# Patient Record
Sex: Male | Born: 1945 | Race: Black or African American | Hispanic: No | Marital: Married | Smoking: Former smoker
Health system: Southern US, Community
[De-identification: ages and names within clinical notes are randomized; demographics above are authoritative.]

## PROBLEM LIST (undated history)

## (undated) DIAGNOSIS — E041 Nontoxic single thyroid nodule: Secondary | ICD-10-CM

## (undated) DIAGNOSIS — H269 Unspecified cataract: Secondary | ICD-10-CM

## (undated) DIAGNOSIS — Z789 Other specified health status: Secondary | ICD-10-CM

## (undated) DIAGNOSIS — Z0282 Encounter for adoption services: Secondary | ICD-10-CM

## (undated) DIAGNOSIS — E785 Hyperlipidemia, unspecified: Secondary | ICD-10-CM

## (undated) DIAGNOSIS — B001 Herpesviral vesicular dermatitis: Secondary | ICD-10-CM

## (undated) DIAGNOSIS — C61 Malignant neoplasm of prostate: Secondary | ICD-10-CM

## (undated) DIAGNOSIS — N529 Male erectile dysfunction, unspecified: Secondary | ICD-10-CM

## (undated) DIAGNOSIS — N200 Calculus of kidney: Secondary | ICD-10-CM

## (undated) DIAGNOSIS — E291 Testicular hypofunction: Secondary | ICD-10-CM

## (undated) HISTORY — DX: Nontoxic single thyroid nodule: E04.1

## (undated) HISTORY — DX: Testicular hypofunction: E29.1

## (undated) HISTORY — DX: Unspecified cataract: H26.9

## (undated) HISTORY — DX: Herpesviral vesicular dermatitis: B00.1

## (undated) HISTORY — DX: Calculus of kidney: N20.0

## (undated) HISTORY — DX: Hyperlipidemia, unspecified: E78.5

## (undated) HISTORY — DX: Male erectile dysfunction, unspecified: N52.9

## (undated) HISTORY — DX: Malignant neoplasm of prostate: C61

## (undated) HISTORY — PX: RADIOACTIVE SEED IMPLANT: SHX5150

---

## 1999-08-10 ENCOUNTER — Other Ambulatory Visit: Admission: RE | Admit: 1999-08-10 | Discharge: 1999-08-10 | Payer: Self-pay | Admitting: *Deleted

## 2004-12-03 ENCOUNTER — Ambulatory Visit: Payer: Self-pay | Admitting: Endocrinology

## 2005-03-04 ENCOUNTER — Ambulatory Visit: Payer: Self-pay | Admitting: Endocrinology

## 2005-03-08 ENCOUNTER — Ambulatory Visit: Payer: Self-pay | Admitting: Endocrinology

## 2005-03-12 ENCOUNTER — Ambulatory Visit: Payer: Self-pay | Admitting: Endocrinology

## 2005-11-24 ENCOUNTER — Ambulatory Visit: Payer: Self-pay | Admitting: Endocrinology

## 2006-04-07 ENCOUNTER — Ambulatory Visit: Payer: Self-pay | Admitting: Internal Medicine

## 2006-04-12 ENCOUNTER — Ambulatory Visit: Payer: Self-pay | Admitting: Endocrinology

## 2006-09-22 ENCOUNTER — Ambulatory Visit: Payer: Self-pay | Admitting: Endocrinology

## 2007-04-11 ENCOUNTER — Ambulatory Visit: Payer: Self-pay | Admitting: Endocrinology

## 2007-04-11 LAB — CONVERTED CEMR LAB
ALT: 20 units/L (ref 0–40)
AST: 25 units/L (ref 0–37)
Albumin: 3.3 g/dL — ABNORMAL LOW (ref 3.5–5.2)
Basophils Absolute: 0 10*3/uL (ref 0.0–0.1)
Calcium: 9 mg/dL (ref 8.4–10.5)
Chloride: 104 meq/L (ref 96–112)
Creatinine, Ser: 1.2 mg/dL (ref 0.4–1.5)
Eosinophils Relative: 2.6 % (ref 0.0–5.0)
GFR calc non Af Amer: 65 mL/min
Glucose, Bld: 101 mg/dL — ABNORMAL HIGH (ref 70–99)
HCT: 43.8 % (ref 39.0–52.0)
Hgb A1c MFr Bld: 5.9 % (ref 4.6–6.0)
Ketones, ur: NEGATIVE mg/dL
Neutrophils Relative %: 51.5 % (ref 43.0–77.0)
Nitrite: NEGATIVE
Platelets: 286 10*3/uL (ref 150–400)
RBC: 5.18 M/uL (ref 4.22–5.81)
RDW: 14.1 % (ref 11.5–14.6)
Sodium: 139 meq/L (ref 135–145)
Specific Gravity, Urine: 1.02 (ref 1.000–1.03)
TSH: 0.91 microintl units/mL (ref 0.35–5.50)
Total Bilirubin: 0.7 mg/dL (ref 0.3–1.2)
Total CHOL/HDL Ratio: 4.3
Total Protein, Urine: NEGATIVE mg/dL
Urine Glucose: NEGATIVE mg/dL
Urobilinogen, UA: 0.2 (ref 0.0–1.0)
VLDL: 62 mg/dL — ABNORMAL HIGH (ref 0–40)
WBC: 6.4 10*3/uL (ref 4.5–10.5)
pH: 6.5 (ref 5.0–8.0)

## 2007-04-14 ENCOUNTER — Ambulatory Visit: Payer: Self-pay | Admitting: Endocrinology

## 2007-08-04 ENCOUNTER — Encounter: Payer: Self-pay | Admitting: Endocrinology

## 2008-05-15 ENCOUNTER — Telehealth (INDEPENDENT_AMBULATORY_CARE_PROVIDER_SITE_OTHER): Payer: Self-pay | Admitting: *Deleted

## 2008-05-15 DIAGNOSIS — N529 Male erectile dysfunction, unspecified: Secondary | ICD-10-CM | POA: Insufficient documentation

## 2008-06-04 ENCOUNTER — Ambulatory Visit: Payer: Self-pay | Admitting: Endocrinology

## 2008-06-05 LAB — CONVERTED CEMR LAB
AST: 25 units/L (ref 0–37)
Basophils Absolute: 0 10*3/uL (ref 0.0–0.1)
Basophils Relative: 0.6 % (ref 0.0–1.0)
Bilirubin Urine: NEGATIVE
Bilirubin, Direct: 0.1 mg/dL (ref 0.0–0.3)
CO2: 29 meq/L (ref 19–32)
Chloride: 100 meq/L (ref 96–112)
Cholesterol: 176 mg/dL (ref 0–200)
Creatinine, Ser: 1.2 mg/dL (ref 0.4–1.5)
Eosinophils Absolute: 0.2 10*3/uL (ref 0.0–0.7)
GFR calc non Af Amer: 65 mL/min
HDL: 40.4 mg/dL (ref >39.0)
Hemoglobin, Urine: NEGATIVE
Hgb A1c MFr Bld: 6 % (ref 4.6–6.0)
Ketones, ur: NEGATIVE mg/dL
Leukocytes, UA: NEGATIVE
Lymphocytes Relative: 40.7 % (ref 12.0–46.0)
MCHC: 33.6 g/dL (ref 30.0–36.0)
MCV: 85.1 fL (ref 78.0–100.0)
Neutrophils Relative %: 47.6 % (ref 43.0–77.0)
Platelets: 269 10*3/uL (ref 150–400)
Potassium: 4.2 meq/L (ref 3.5–5.1)
RDW: 13.8 % (ref 11.5–14.6)
Sodium: 137 meq/L (ref 135–145)
Specific Gravity, Urine: 1.025 (ref 1.000–1.03)
TSH: 0.81 microintl units/mL (ref 0.35–5.50)
Testosterone: 225.07 ng/dL — ABNORMAL LOW (ref 350.00–890)
Total Bilirubin: 1.1 mg/dL (ref 0.3–1.2)
Total CHOL/HDL Ratio: 4.4
Urine Glucose: NEGATIVE mg/dL
Urobilinogen, UA: 0.2 (ref 0.0–1.0)
VLDL: 54 mg/dL — ABNORMAL HIGH (ref 0–40)

## 2008-06-07 ENCOUNTER — Ambulatory Visit: Payer: Self-pay | Admitting: Endocrinology

## 2008-06-07 DIAGNOSIS — E1129 Type 2 diabetes mellitus with other diabetic kidney complication: Secondary | ICD-10-CM | POA: Insufficient documentation

## 2008-06-07 DIAGNOSIS — E119 Type 2 diabetes mellitus without complications: Secondary | ICD-10-CM | POA: Insufficient documentation

## 2008-06-07 DIAGNOSIS — E785 Hyperlipidemia, unspecified: Secondary | ICD-10-CM | POA: Insufficient documentation

## 2008-06-07 LAB — CONVERTED CEMR LAB
FSH: 10.8 milliintl units/mL
LH: 4.1 milliintl units/mL
Prolactin: 3.8 ng/mL

## 2009-02-20 ENCOUNTER — Telehealth (INDEPENDENT_AMBULATORY_CARE_PROVIDER_SITE_OTHER): Payer: Self-pay | Admitting: *Deleted

## 2009-06-27 ENCOUNTER — Ambulatory Visit: Payer: Self-pay | Admitting: Endocrinology

## 2009-06-28 LAB — CONVERTED CEMR LAB
ALT: 18 U/L
AST: 26 U/L
Albumin: 3.8 g/dL
Alkaline Phosphatase: 45 U/L
BUN: 14 mg/dL
Basophils Absolute: 0 K/uL
Basophils Relative: 0.1 %
Bilirubin, Direct: 0.3 mg/dL
CO2: 29 meq/L
Calcium: 9.3 mg/dL
Chloride: 102 meq/L
Cholesterol: 194 mg/dL
Creatinine, Ser: 1.4 mg/dL
Eosinophils Absolute: 0.1 K/uL
Eosinophils Relative: 1.1 %
GFR calc non Af Amer: 65.57 mL/min
Glucose, Bld: 94 mg/dL
HCT: 42.5 %
HDL: 46.2 mg/dL
Hemoglobin: 14.3 g/dL
LDL Cholesterol: 121 mg/dL — ABNORMAL HIGH
Leukocytes, UA: NEGATIVE
Lymphocytes Relative: 31.2 %
Lymphs Abs: 2 K/uL
MCHC: 33.6 g/dL
MCV: 85.2 fL
Monocytes Absolute: 0.5 K/uL
Monocytes Relative: 7.4 %
Neutro Abs: 3.8 K/uL
Neutrophils Relative %: 60.2 %
Nitrite: NEGATIVE
PSA: 2.07 ng/mL
Platelets: 274 K/uL
Potassium: 4.2 meq/L
RBC: 4.99 M/uL
RDW: 15 % — ABNORMAL HIGH
Sodium: 138 meq/L
Specific Gravity, Urine: 1.025
TSH: 0.67 u[IU]/mL
Testosterone: 214.86 ng/dL — ABNORMAL LOW
Total Bilirubin: 1.6 mg/dL — ABNORMAL HIGH
Total CHOL/HDL Ratio: 4
Total Protein, Urine: NEGATIVE mg/dL
Total Protein: 7.5 g/dL
Triglycerides: 136 mg/dL
Urine Glucose: NEGATIVE mg/dL
Urobilinogen, UA: 1
VLDL: 27.2 mg/dL
WBC: 6.4 10*3/microliter
pH: 5.5

## 2009-07-03 ENCOUNTER — Ambulatory Visit: Payer: Self-pay | Admitting: Endocrinology

## 2009-07-03 DIAGNOSIS — E7800 Pure hypercholesterolemia, unspecified: Secondary | ICD-10-CM | POA: Insufficient documentation

## 2009-07-08 ENCOUNTER — Telehealth: Payer: Self-pay | Admitting: Endocrinology

## 2009-07-09 ENCOUNTER — Encounter: Payer: Self-pay | Admitting: Endocrinology

## 2009-07-11 ENCOUNTER — Encounter: Admission: RE | Admit: 2009-07-11 | Discharge: 2009-07-11 | Payer: Self-pay | Admitting: Endocrinology

## 2009-07-11 IMAGING — US US SOFT TISSUE HEAD/NECK
1 series · 14 of 25 positions shown · non-contrast
Comparison: None

CLINICAL DATA: Right thyroid nodule on physical exam

THYROID ULTRASOUND
TECHNIQUE: Ultrasound examination of the thyroid gland and
adjacent soft tissues was performed.

[Series 1: us soft tissue head/neck · 0.10mm/px · 14 of 55 slices shown]
[im 1/55]
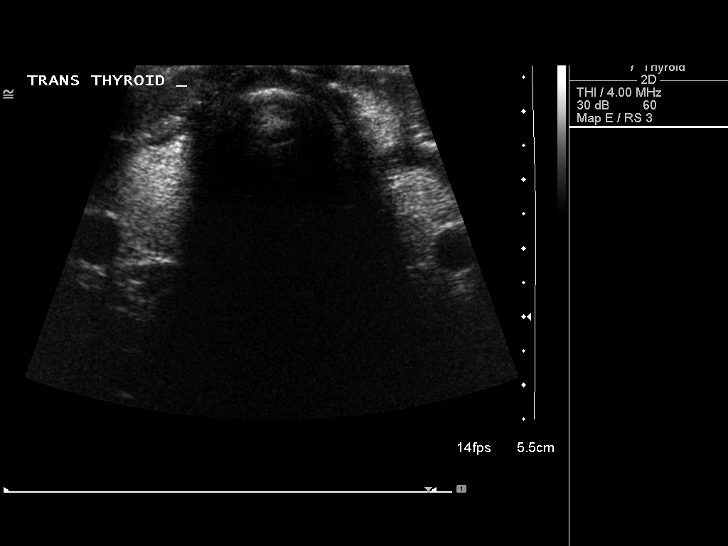
[im 5/55]
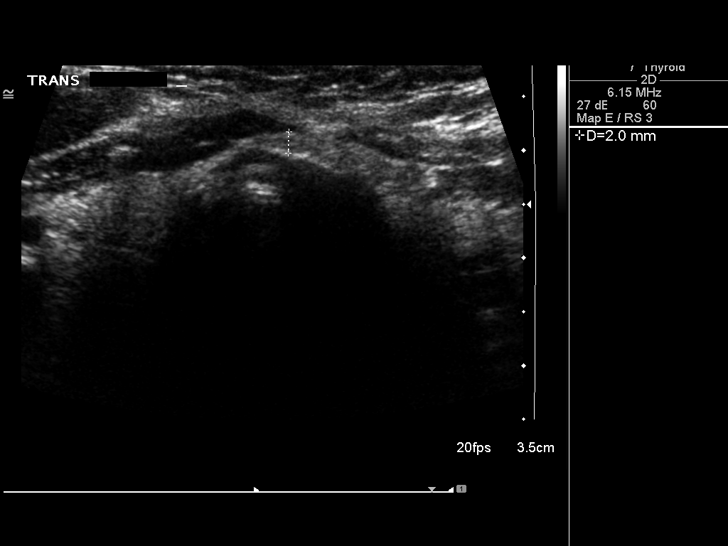
[im 10/55]
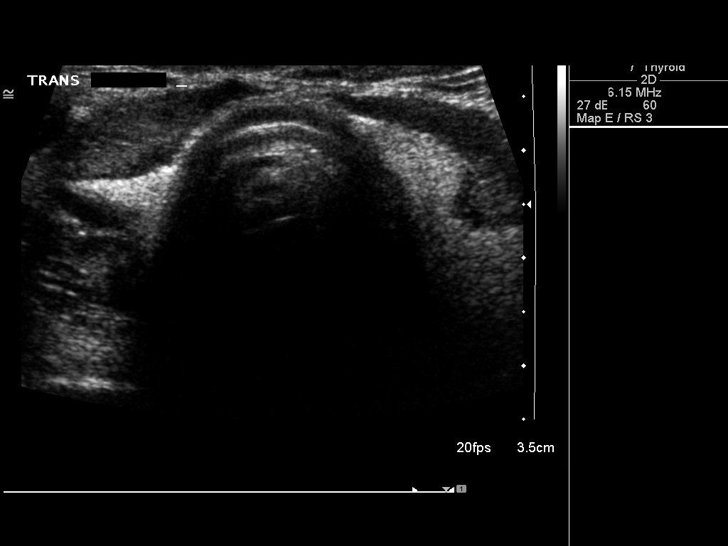
[im 14/55]
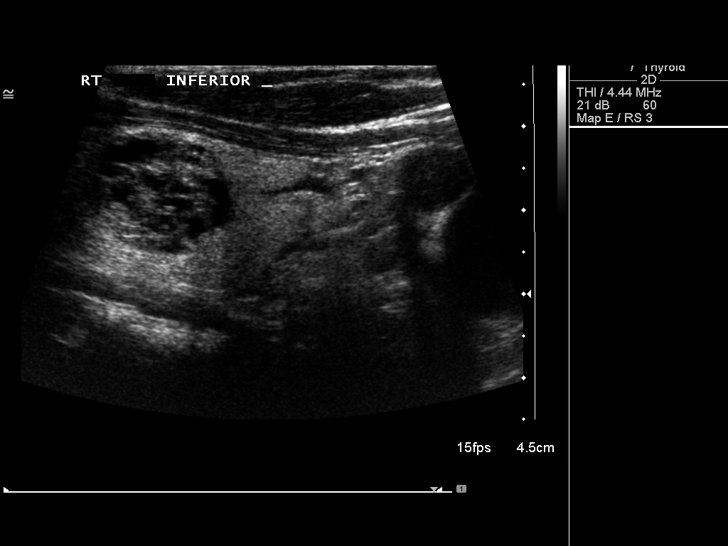
[im 19/55]
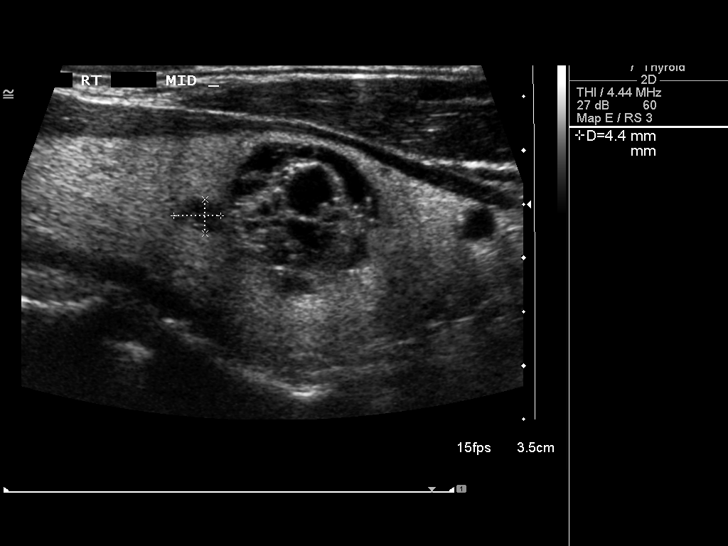
[im 21/55]
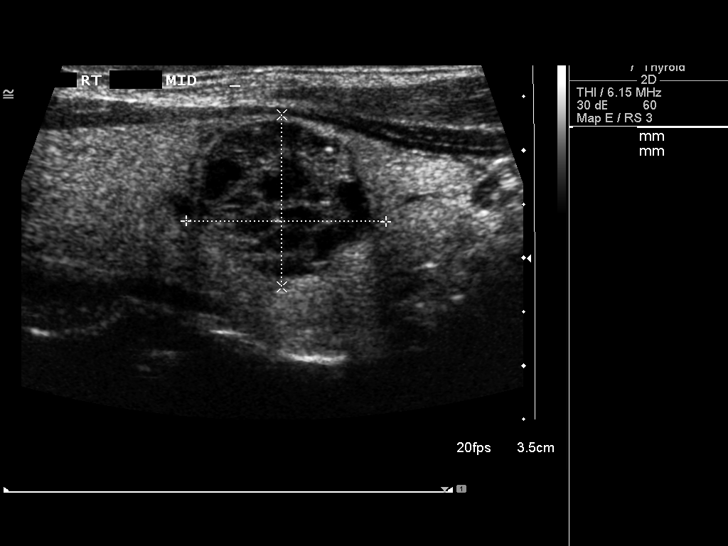
[im 25/55]
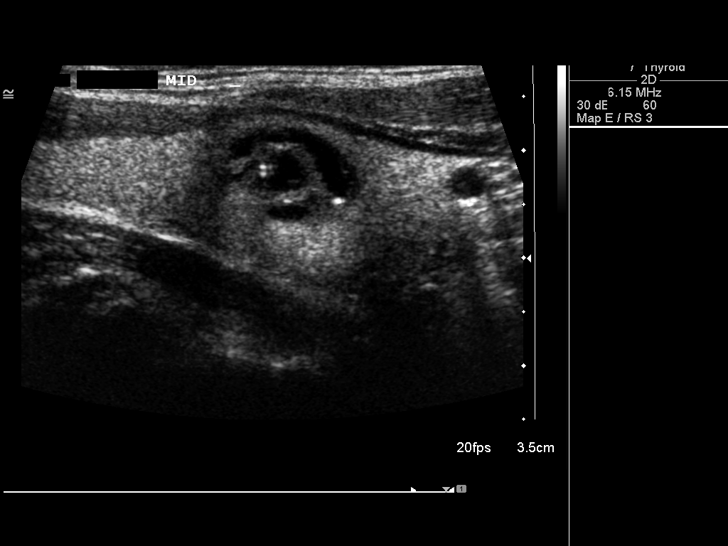
[im 30/55]
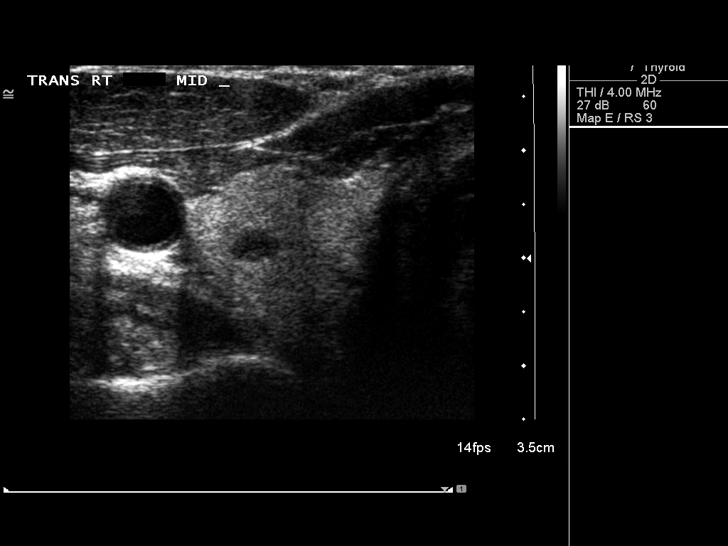
[im 34/55]
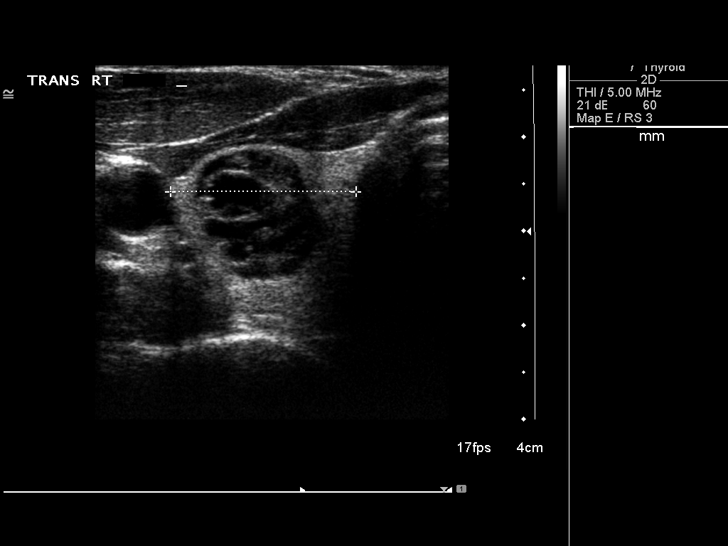
[im 37/55]
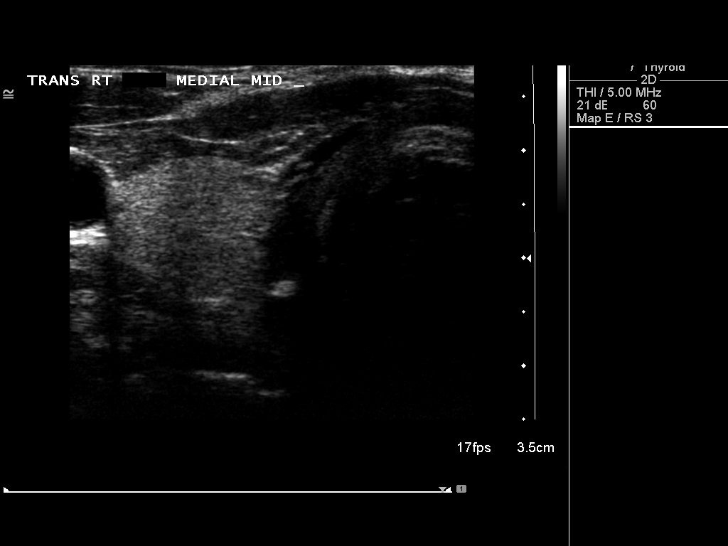
[im 41/55]
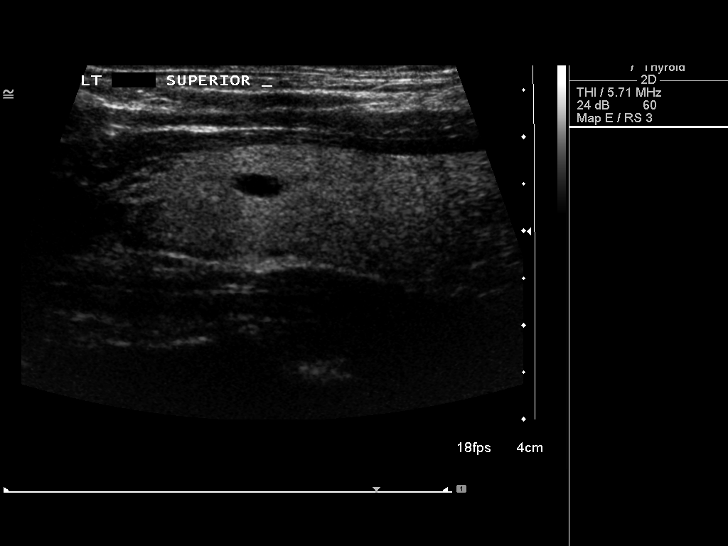
[im 46/55]
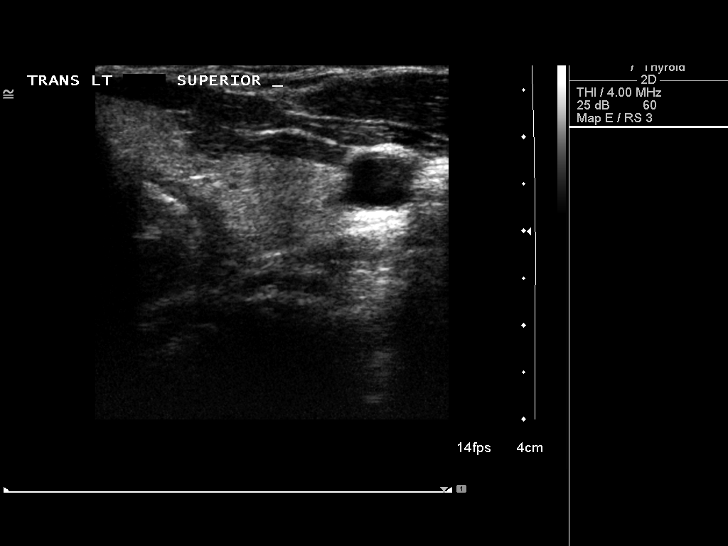
[im 50/55]
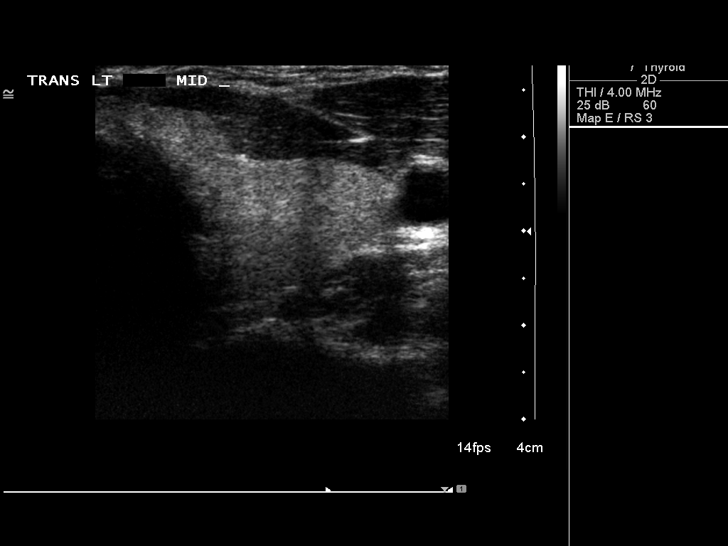
[im 55/55]
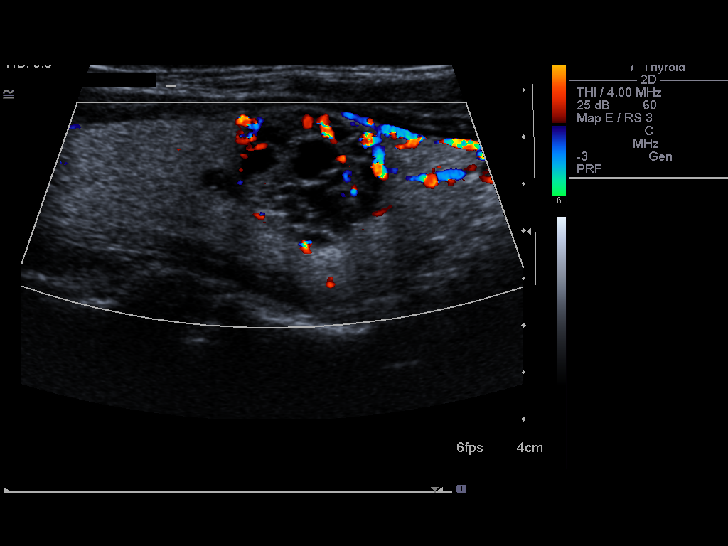

[14 of 25 positions shown; findings below may reference images not displayed]

FINDINGS: The right lobe of thyroid measures 5.3 cm sagittally,
with a depth of 2.2 cm and width of 2.0 cm.  There is a dominant
complex nodule in the right lobe of 1.9 x 1.6 x 1.6 cm with some
calcifications. There is some internal blood flow within this
complex lesion. Biopsy of this dominant nodule could be performed.
Smaller nodules on the right measure no more than 5 mm in diameter.

The left lobe measures 4.5 x 1.7 x 2.2 cm, with the isthmus
measuring 2.0 mm.  Small hypoechoic nodules are noted on the left
of no more than 8 mm in diameter.
IMPRESSION: There is a dominant nodule in the right mid lower lobe of thyroid
measuring 1.9 x 1.6 x 1.6 cm which appears complex.  Biopsy of this
dominant nodule could be performed.

## 2009-07-16 ENCOUNTER — Other Ambulatory Visit: Admission: RE | Admit: 2009-07-16 | Discharge: 2009-07-16 | Payer: Self-pay | Admitting: Interventional Radiology

## 2009-07-16 ENCOUNTER — Encounter: Admission: RE | Admit: 2009-07-16 | Discharge: 2009-07-16 | Payer: Self-pay | Admitting: Endocrinology

## 2009-07-16 ENCOUNTER — Encounter: Payer: Self-pay | Admitting: Endocrinology

## 2009-07-16 ENCOUNTER — Encounter (INDEPENDENT_AMBULATORY_CARE_PROVIDER_SITE_OTHER): Payer: Self-pay | Admitting: Interventional Radiology

## 2009-07-16 IMAGING — US US BIOPSY
1 series · 8 of 8 positions shown · non-contrast
Comparison: [DATE]

CLINICAL DATA: 1.9 cm solid nodule in the right lobe of thyroid.
The patient presents for biopsy.

ULTRASOUND-GUIDED NEEDLE ASPIRATE BIOPSY, RIGHT LOBE OF THYROID
The above procedure was discussed with the patient and written
informed consent was obtained.

[Series 1: us biopsy · 0.07mm/px · 8 acquisitions, 8 frames shown]
[im 1/8]
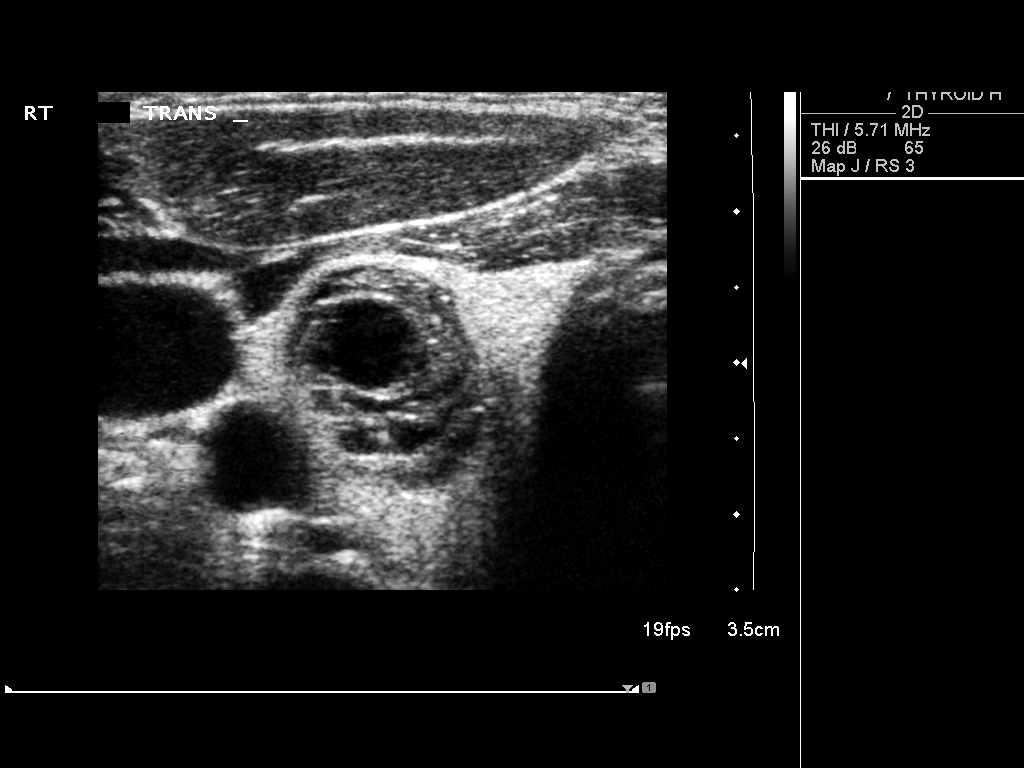
[im 2/8]
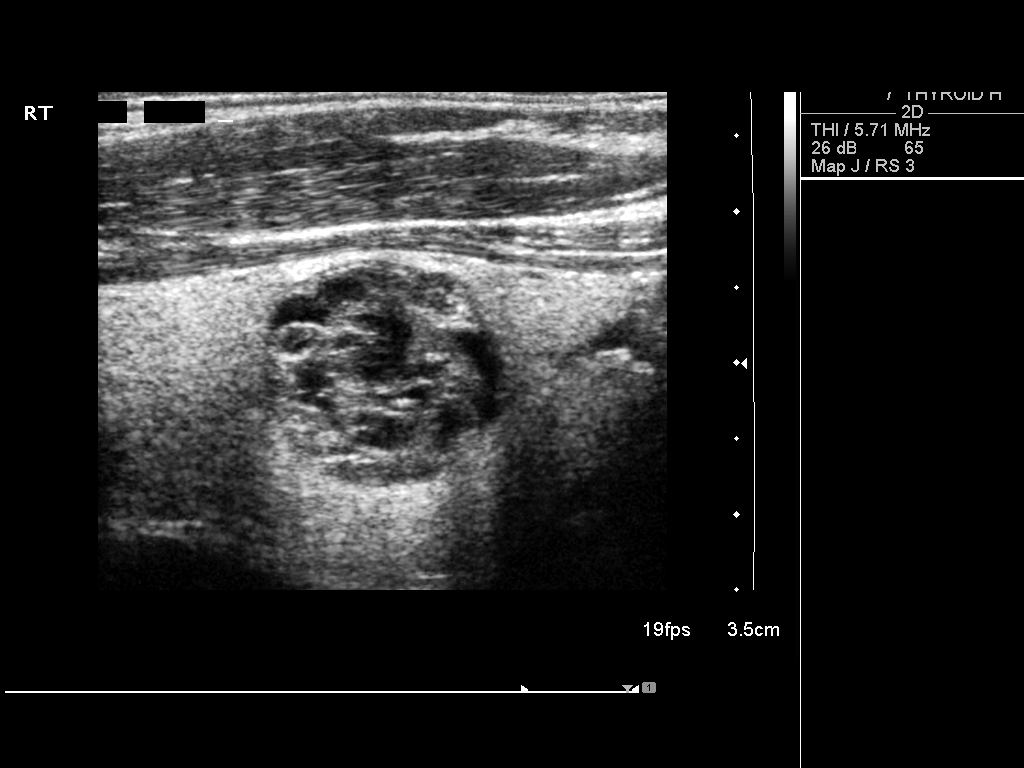
[im 3/8]
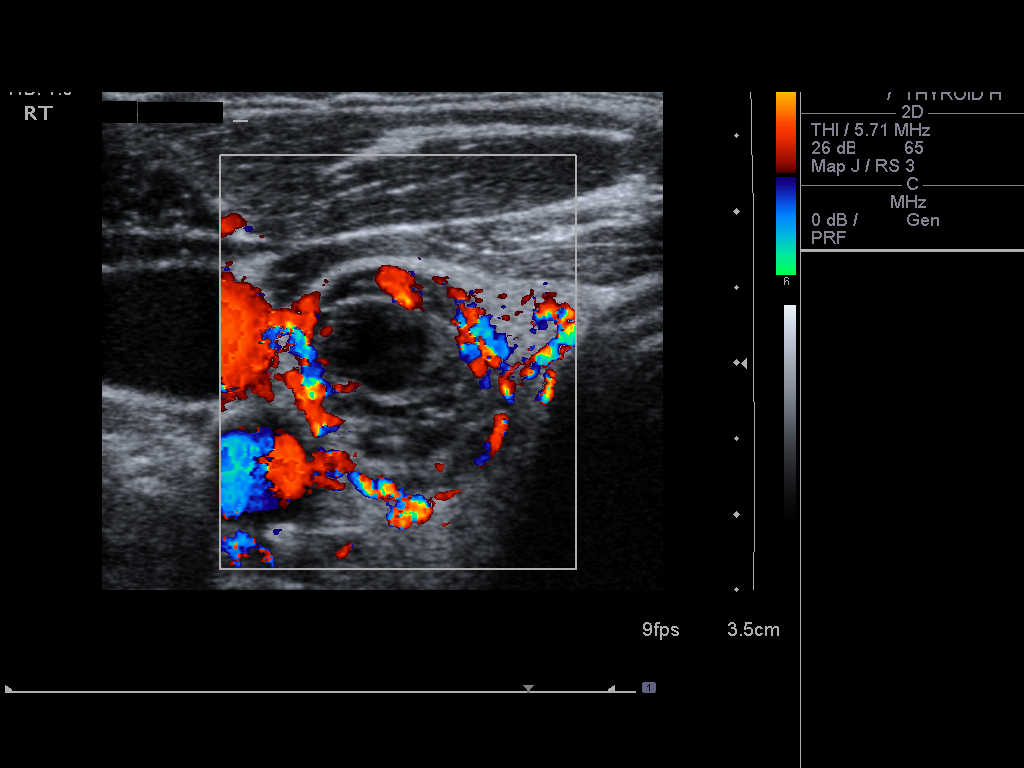
[im 4/8]
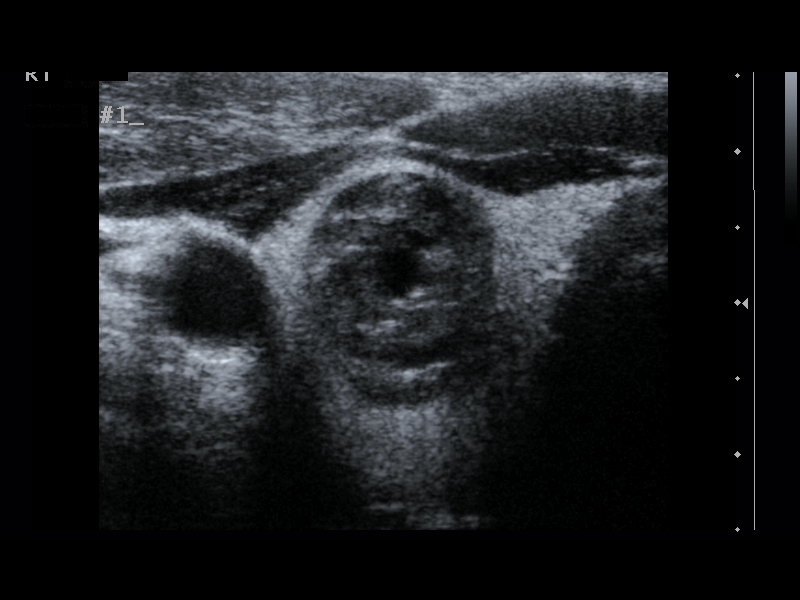
[im 5/8]
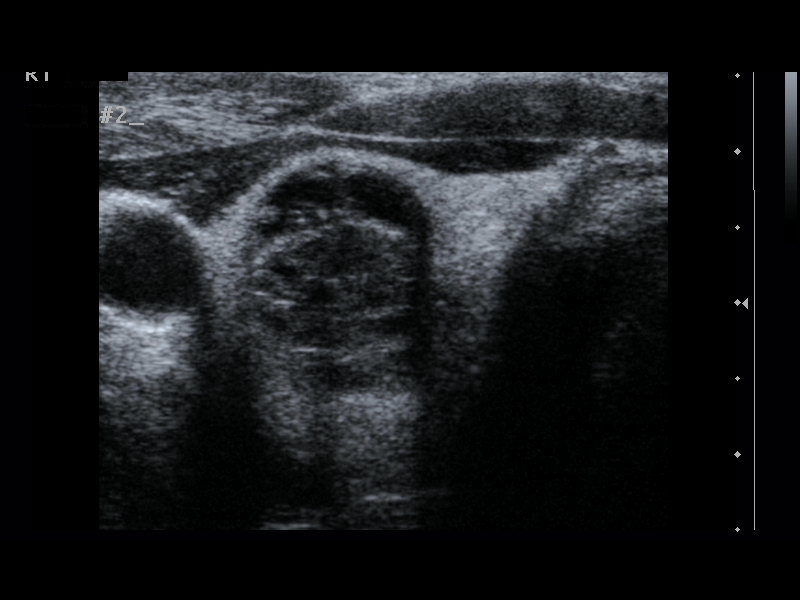
[im 6/8]
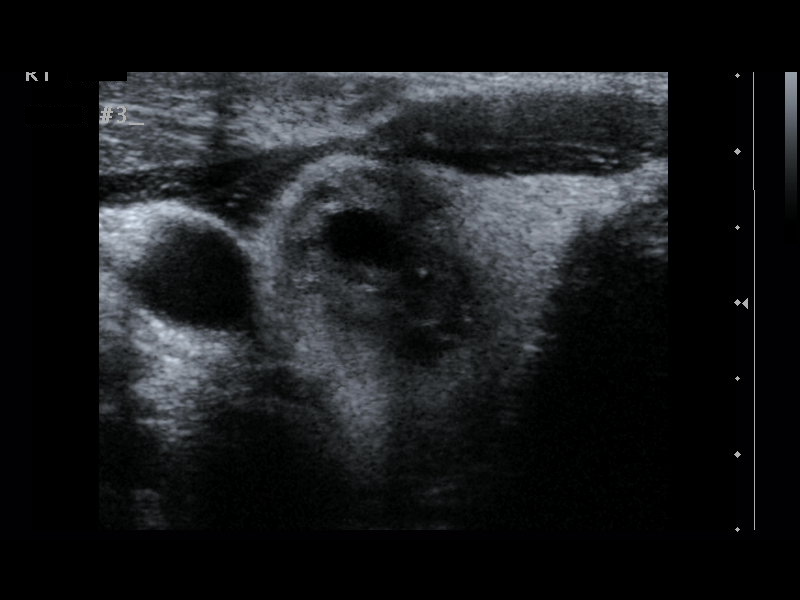
[im 7/8]
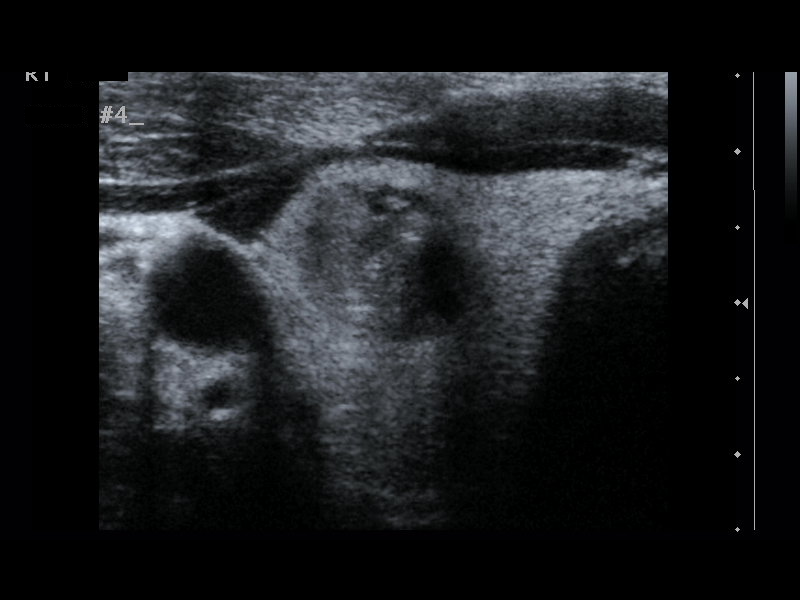
[im 8/8]
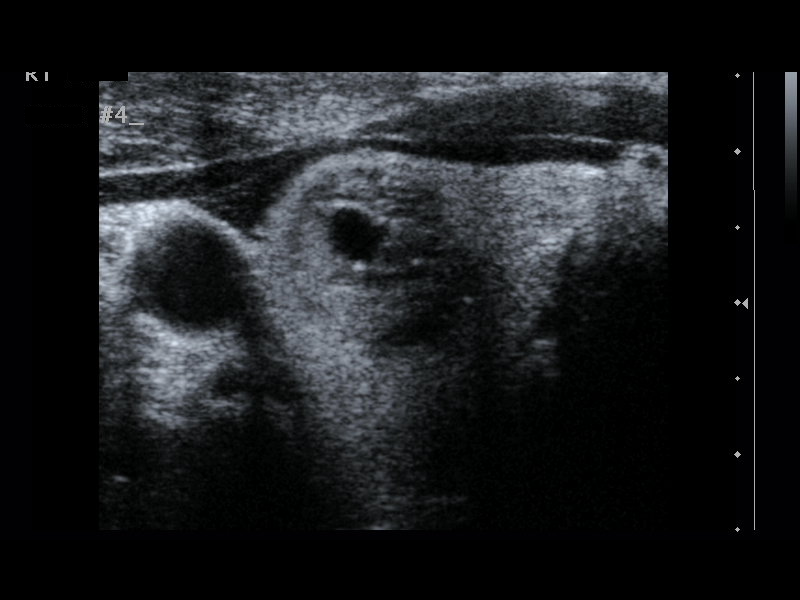

[8 of 8 positions shown; findings below may reference images not displayed]

FINDINGS: Ultrasound was performed to localize and mark an adequate
site for the biopsy.  The patient was then prepped and draped in a
normal sterile fashion.  Local anesthesia was provided with 1%
lidocaine.  Using direct ultrasound guidance, 4 passes were made
using 25 gauge needles into the nodule within the right lobe of the
thyroid.  Ultrasound was used to confirm needle placements on all
occasions.  Specimens were sent to Pathology for analysis.  Post
procedural imaging demonstrated no hematoma or immediate
complication.  The patient tolerated the procedure well.
IMPRESSION: Ultrasound guided needle aspirate biopsy was performed of the
dominant right thyroid nodule.  Pathology is pending.

## 2009-07-21 ENCOUNTER — Telehealth: Payer: Self-pay | Admitting: Internal Medicine

## 2009-07-30 ENCOUNTER — Ambulatory Visit: Payer: Self-pay | Admitting: Endocrinology

## 2010-08-06 ENCOUNTER — Telehealth: Payer: Self-pay | Admitting: Internal Medicine

## 2010-08-13 ENCOUNTER — Telehealth: Payer: Self-pay | Admitting: Internal Medicine

## 2010-09-17 ENCOUNTER — Encounter: Payer: Self-pay | Admitting: Internal Medicine

## 2010-09-17 ENCOUNTER — Ambulatory Visit: Payer: Self-pay | Admitting: Internal Medicine

## 2010-09-17 DIAGNOSIS — R5381 Other malaise: Secondary | ICD-10-CM

## 2010-09-17 DIAGNOSIS — K4090 Unilateral inguinal hernia, without obstruction or gangrene, not specified as recurrent: Secondary | ICD-10-CM | POA: Insufficient documentation

## 2010-09-17 DIAGNOSIS — K644 Residual hemorrhoidal skin tags: Secondary | ICD-10-CM | POA: Insufficient documentation

## 2010-09-17 DIAGNOSIS — R5383 Other fatigue: Secondary | ICD-10-CM

## 2010-09-18 LAB — CONVERTED CEMR LAB
Albumin: 3.8 g/dL (ref 3.5–5.2)
Alkaline Phosphatase: 47 units/L (ref 39–117)
Basophils Absolute: 0 10*3/uL (ref 0.0–0.1)
Bilirubin Urine: NEGATIVE
CO2: 26 meq/L (ref 19–32)
Calcium: 9.5 mg/dL (ref 8.4–10.5)
Cholesterol: 159 mg/dL (ref 0–200)
Direct LDL: 77.3 mg/dL
Eosinophils Absolute: 0.1 10*3/uL (ref 0.0–0.7)
Glucose, Bld: 84 mg/dL (ref 70–99)
Hemoglobin, Urine: NEGATIVE
Hemoglobin: 15.3 g/dL (ref 13.0–17.0)
Hgb A1c MFr Bld: 6.5 % (ref 4.6–6.5)
Iron: 100 ug/dL (ref 42–165)
Lymphocytes Relative: 34.9 % (ref 12.0–46.0)
Lymphs Abs: 2.6 10*3/uL (ref 0.7–4.0)
MCHC: 33.7 g/dL (ref 30.0–36.0)
Neutro Abs: 3.8 10*3/uL (ref 1.4–7.7)
RDW: 17.5 % — ABNORMAL HIGH (ref 11.5–14.6)
Saturation Ratios: 27.3 % (ref 20.0–50.0)
Sed Rate: 9 mm/hr (ref 0–22)
Sodium: 139 meq/L (ref 135–145)
TSH: 0.83 microintl units/mL (ref 0.35–5.50)
Total Protein, Urine: NEGATIVE mg/dL
Triglycerides: 302 mg/dL — ABNORMAL HIGH (ref 0.0–149.0)
Urine Glucose: NEGATIVE mg/dL
Vitamin B-12: 499 pg/mL (ref 211–911)

## 2010-09-24 ENCOUNTER — Encounter: Admission: RE | Admit: 2010-09-24 | Discharge: 2010-09-24 | Payer: Self-pay | Admitting: Internal Medicine

## 2010-09-24 IMAGING — US US SOFT TISSUE HEAD/NECK
1 series · 14 of 25 positions shown · non-contrast
Comparison: [DATE]

CLINICAL DATA: Follow-up thyroid nodule

THYROID ULTRASOUND
TECHNIQUE: Ultrasound examination of the thyroid gland and adjacent
soft tissues was performed.

[Series 1: us soft tissue head/neck · 0.06mm/px · 14 of 63 slices shown]
[im 1/63]
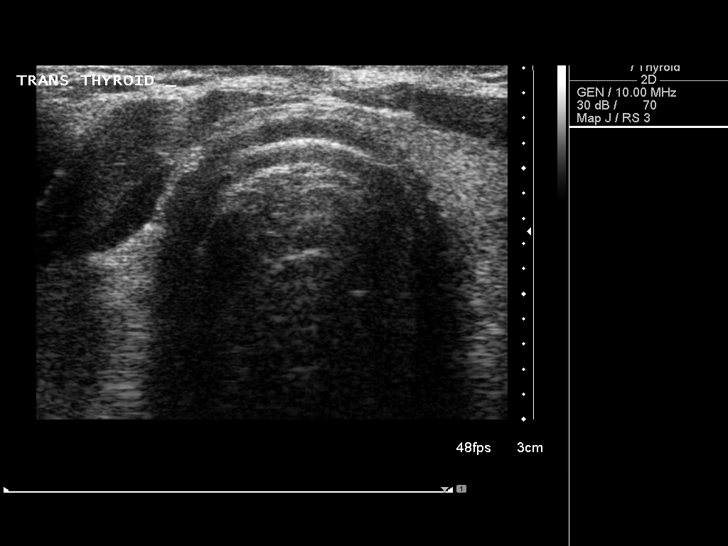
[im 6/63]
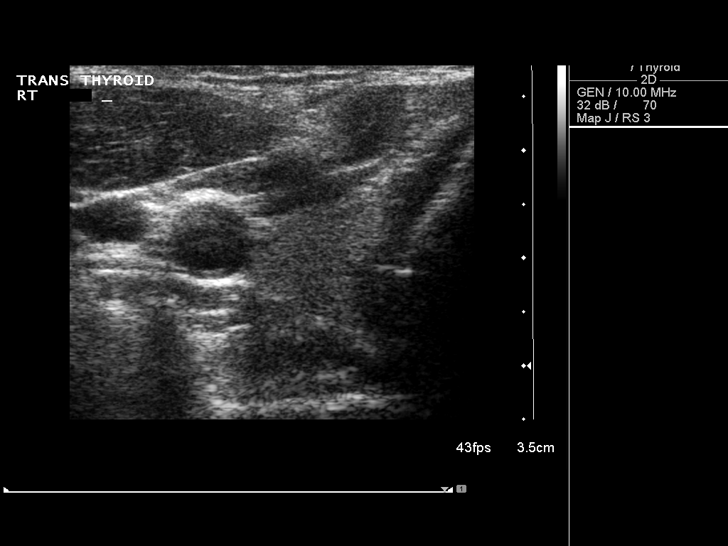
[im 11/63]
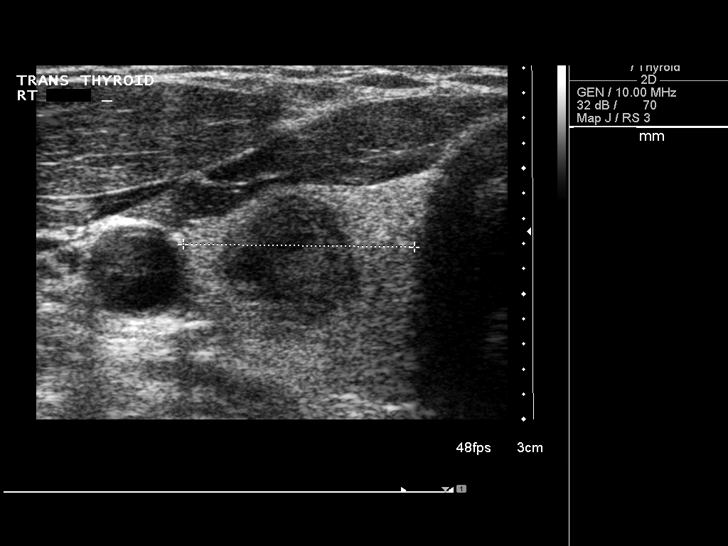
[im 16/63]
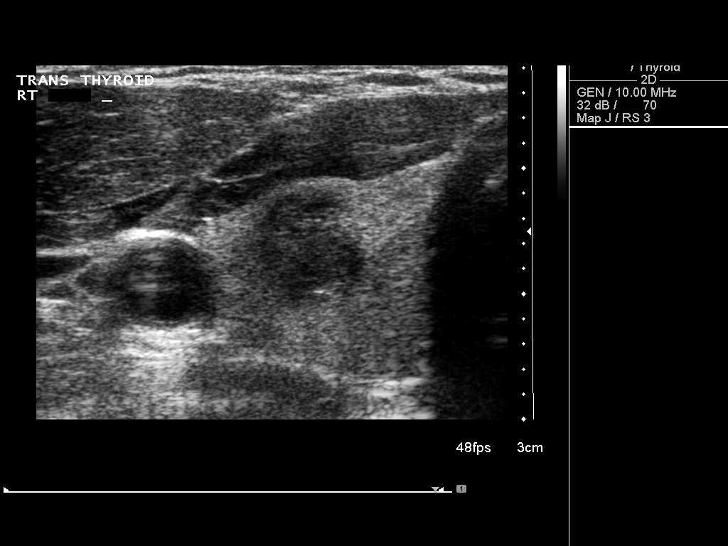
[im 21/63]
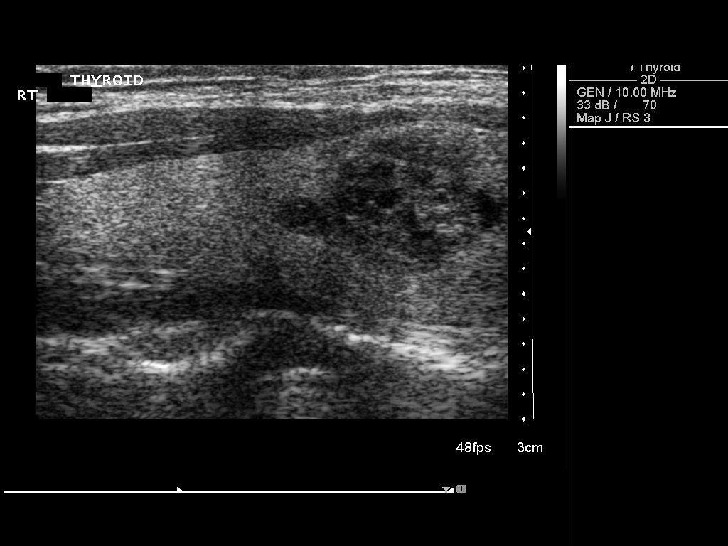
[im 24/63]
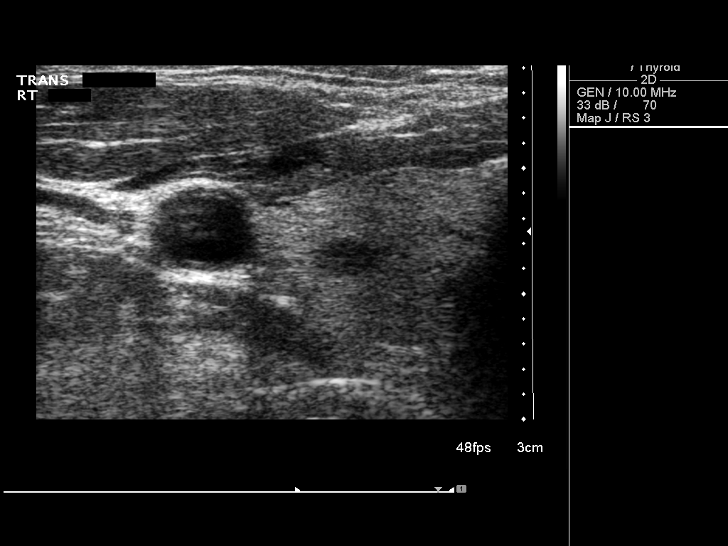
[im 29/63]
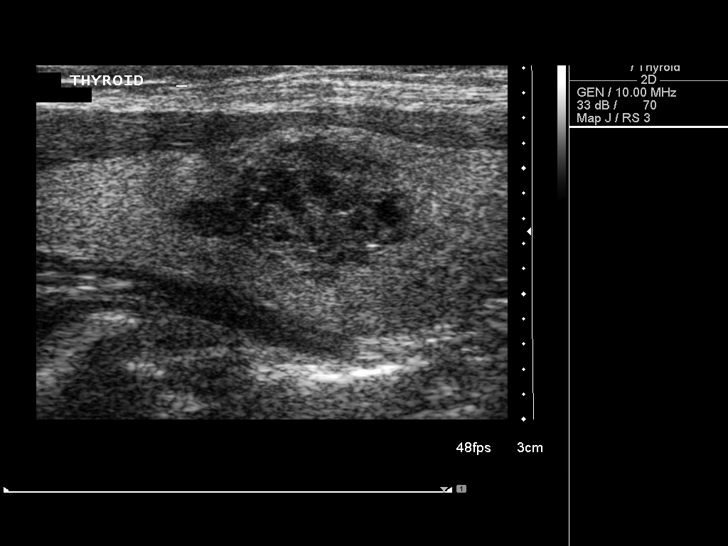
[im 34/63]
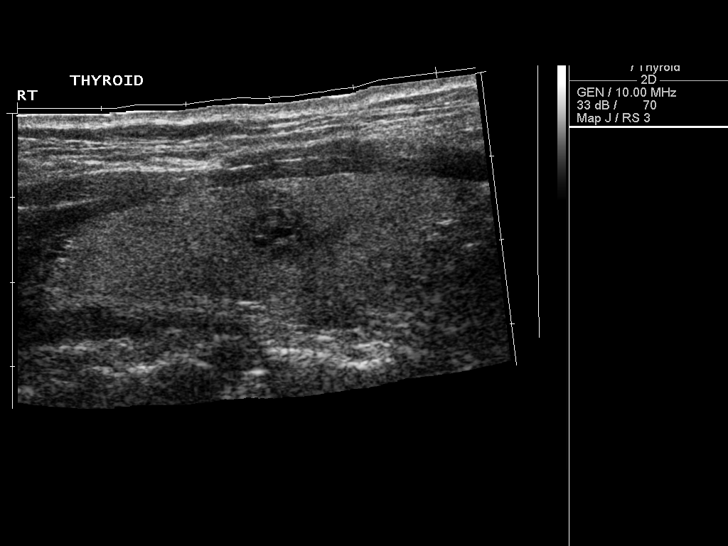
[im 39/63]
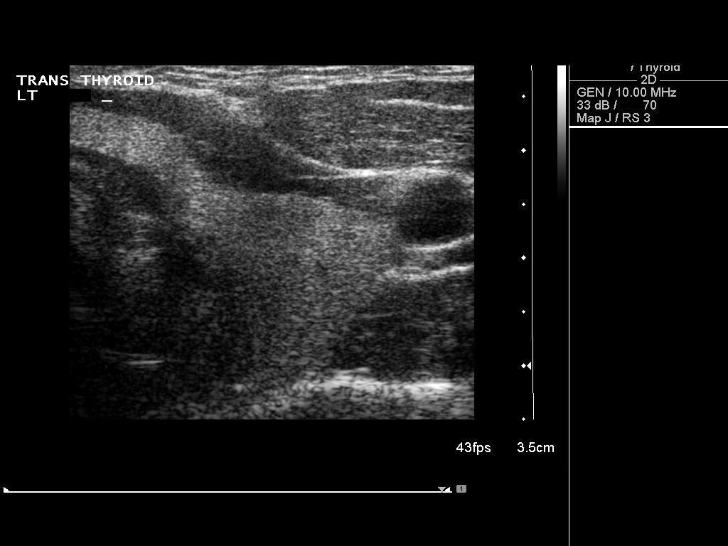
[im 42/63]
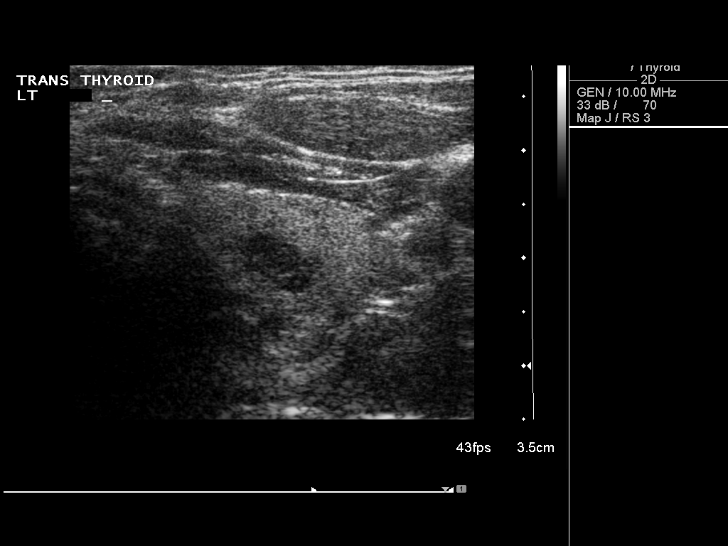
[im 47/63]
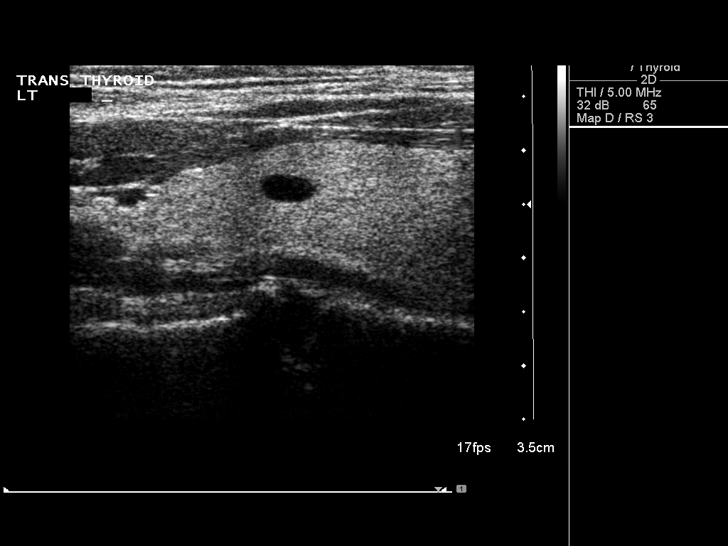
[im 52/63]
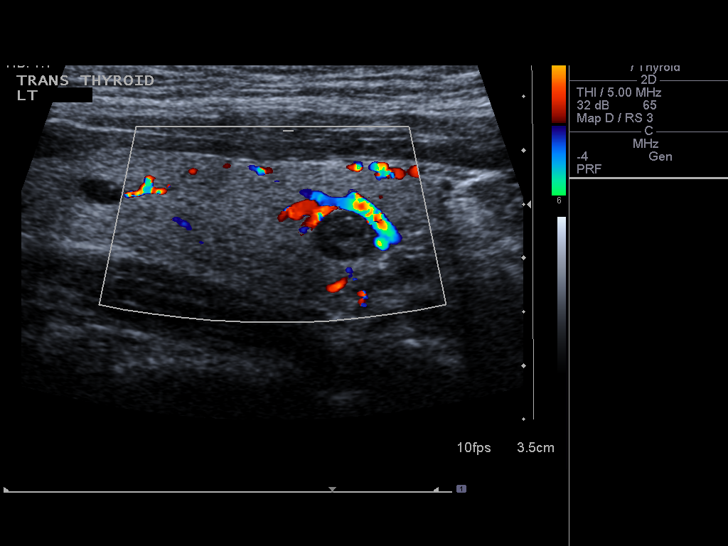
[im 57/63]
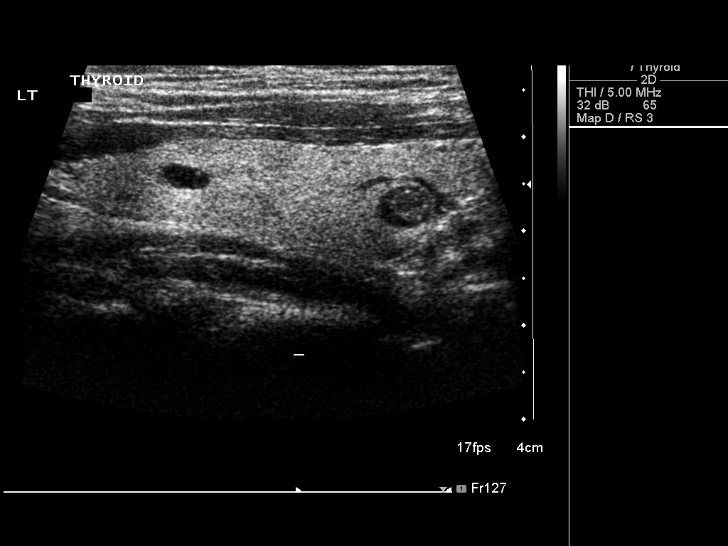
[im 63/63]
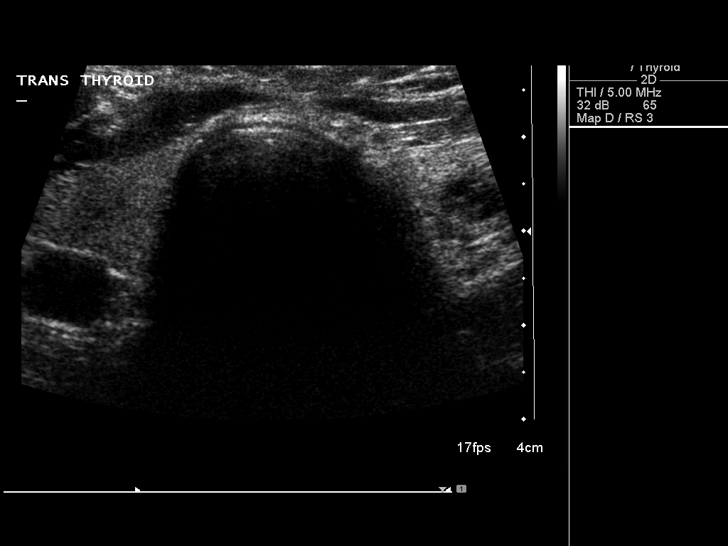

[14 of 25 positions shown; findings below may reference images not displayed]

FINDINGS: Right thyroid lobe:  5.4 x 1.9 x 1.8 cm.
Left thyroid lobe:  4.7 x 1.3 x 2.0 cm.
Isthmus:  2 mm.

Focal nodules:  Dominant nodule is within the right mid to lower
pole.  This currently measures 1.5 x 1.2 x 1.2 cm and is
complex/hypoechoic.  There are a few scattered echogenic foci
within the nodule which may be microcalcifications.  These are
unchanged.  The size has decreased slightly since prior study when
this measured up to 1.9 cm.  Other small sub centimeter nodules are
again seen, unchanged.

Lymphadenopathy:  None visualized.
IMPRESSION: Slight interval decrease in the size of the dominant right thyroid
nodule.  Recommend correlation with prior biopsy results.

## 2011-01-26 NOTE — Assessment & Plan Note (Signed)
Summary: YEARLY FU /MEDICARE/NWS  #   Vital Signs:  Patient profile:   65 year old male Height:      72 inches Weight:      217.38 pounds BMI:     29.59 O2 Sat:      96 % on Room air Temp:     98.8 degrees F oral Pulse rate:   73 / minute BP sitting:   122 / 80  (left arm) Cuff size:   large  Vitals Entered By: Shirlean Mylar Ewing CMA ([place]) (September 17, 2010 1:25 PM)  O2 Flow:  Room air  Preventive Care Screening  Last Flu Shot:    Date:  09/14/2010    Results:  given   CC: Yearly/RE/wellness   CC:  Yearly/RE/wellness.  History of Present Illness: here to establish with new PCP with wellness and exam;  did see Dr fox/urology  - tx with androgel and feels his muscle strenght and libido much improved per pt;  Pt denies CP, worsening sob, doe, wheezing, orthopnea, pnd, worsening LE edema, palps, dizziness or syncope  Pt denies new neuro symptoms such as headache, facial or extremity weakness  Denies  polydipsia or polyruria.  No fever, wt loss, night sweats, loss of appetite or other constitutional symptoms   Also has known small inguian hernia per urology, asympt.  Pt denies polydipsia, polyuria, or low sugar symptoms such as shakiness improved with eating.  Overall good compliance with meds, trying to follow low chol, DM diet, wt stable, little excercise however Denies change in hyper or hypothyroid symptoms such as voice or wt change or skin change  Here for wellness Diet: Heart Healthy or DM if diabetic Physical Activities: has home gym with 3 times per run/walk with wife Depression/mood screen: Negative Hearing: Intact bilateral to mild decreased Visual Acuity: Grossly normal, gets exam yearly, wears glasses ADL's: Capable  Fall Risk: None Home Safety: Good Cognitive Impairment:  Gen appearance, affect, speech, memory, attention & motor skills grossly intact End-of-Life Planning: Advance directive - Full code/I agree   Preventive Screening-Counseling &  Management  Alcohol-Tobacco     Smoking Status: quit      Drug Use:  no.    Problems Prior to Update: 1)  Encounter For Long-term Use of Other Medications  (ICD-V58.69) 2)  Hypercholesterolemia  (ICD-272.0) 3)  Thyroid Nodule, Right  (ICD-241.0) 4)  Routine General Medical Exam@health  Care [facility]  (ICD-V70.0) 5)  Hypogonadism, Male  (ICD-257.2) 6)  Dm  (ICD-250.00) 7)  Erectile Dysfunction, Organic  (ICD-607.84)  Medications Prior to Update: 1)  Multivitamins   Tabs (Multiple Vitamin) .... Take 1 By Mouth Qd 2)  Fish Oil 1000 Mg  Caps (Omega-3 Fatty Acids) .... Take 1 By Mouth Qd 3)  Garlic [id number] Mg  Tbec (Garlic) .... Take 1 By Mouth Qd 4)  Acyclovir 800 Mg  Tabs (Acyclovir) .... Take 1 By Mouth Qd 5)  Viagra 100 Mg  Tabs (Sildenafil Citrate) .... Take Prn 6)  Glucophage Xr 500 Mg  Tb24 (Metformin Hcl) .... Qd 7)  Cialis 2.5 Mg Tabs (Tadalafil) .... Qd 8)  Crestor 20 Mg Tabs (Rosuvastatin Calcium) .[name].. 1 Qd 9)  Clomiphene Citrate 50 Mg Tabs (Clomiphene Citrate) .... 1/4 Tab Three Times Weekly (M,w,f)  Current Medications (verified): 1)  Multivitamins   Tabs (Multiple Vitamin) .... Take 1 By Mouth Qd 2)  Fish Oil 1000 Mg  Caps (Omega-3 Fatty Acids) .... Take 1 By Mouth Qd 3)  Garlic [id number] Mg  Tbec (Garlic) .Marland KitchenMarland KitchenMarland Kitchen  Take 1 By Mouth Qd 4)  Acyclovir 800 Mg  Tabs (Acyclovir) .... Take 1 By Mouth Once Daily 5)  Viagra 100 Mg  Tabs (Sildenafil Citrate) .[name].. 1 By Mouth Once Daily As Needed 6)  Glucophage Xr 500 Mg  Tb24 (Metformin Hcl) .[name].. 1 By Mouth Once Daily 7)  Cialis 20 Mg Tabs (Tadalafil) .[name].. 1 By Mouth Every Other Day As Needed 8)  Crestor 20 Mg Tabs (Rosuvastatin Calcium) .[name].. 1 By Mouth Once Daily 9)  Androgel Pump 1.25 Gm/act (1%) Gel (Testosterone) .... 4 Pumps Per Day 10)  Proctosol Hc 2.5 % Crea (Hydrocortisone) .... Use Asd  Two Times A Day As Needed 11)  Aspir-Low 81 Mg Tbec (Aspirin) .[name].. 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 Hyperglycemia HSV- (Upper lip) -herpes labialis Hypogonadism right thyroid nodule  MD roster:  Dr Hassell Done - urology                   endo - cant remember name                   optho - sees optometry yearly  Past Surgical History: Denies surgical history  Family History: Reviewed history from 06/07/2008 and no changes required. knows very little about his heredity - adopted  Social History: Reviewed history from 06/07/2008 and no changes required. 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         all otherwise negative per pt -  except for right knee pain with running so has to do more walking; also has small hemorrhoid noted without bleeding and very little discomfort; also mild fatigue without hypersomnia  Physical Exam  General:  alert and overweight-appearing.   Head:  normocephalic and atraumatic.   Eyes:  vision grossly intact, pupils equal, and pupils round.   Ears:  R ear normal and L ear normal.   Nose:  no external deformity and no nasal discharge.   Mouth:   no gingival abnormalities and pharynx pink and moist.   Neck:  supple and no masses.   Lungs:  normal respiratory effort and normal breath sounds.   Heart:  normal rate and regular rhythm.   Abdomen:  soft, non-tender, and normal bowel sounds.   Rectal:  deferred Genitalia:  Testes bilaterally descended without nodularity, tenderness or masses. No scrotal masses or lesions. No penis lesions or urethral discharge. except LIH small reducible nontender noted Msk:  no joint tenderness and no joint swelling.   Extremities:  no edema, no erythema  Neurologic:  cranial nerves II-XII intact and strength normal in all extremities.     Impression & Recommendations:  Problem # 1:  Preventive Health Care (ICD-V70.0)  Overall doing well, age  appropriate education and counseling updated and referral for appropriate preventive services done unless declined, immunizations up to date or declined, diet counseling done if overweight, urged to quit smoking if smokes , most recent labs reviewed and current ordered if appropriate, ecg reviewed or declined (interpretation per ECG scanned in the EMR if done); information regarding Medicare Prevention requirements given if appropriate; speciality referrals updated as appropriate   Orders: EKG w/ Interpretation (93000) Gastroenterology Referral (GI) Welcome to Medicare, Physical [id number])  Problem # 2:  THYROID NODULE, RIGHT (ICD-241.0)  ok for f/u thyroid u/s, exam stable, and asympt  Orders: Radiology Referral (Radiology)  Problem # 3:  INGUINAL HERNIA (ICD-550.90) ok to call gen surg (pt states will call)  Problem # 4:  EXTERNAL HEMORRHOIDS WITHOUT MENTION COMP (ICD-455.3) for procotsol as needed   Problem # 5:  FATIGUE (ICD-780.79)  exam benign, to check labs below; follow with expectant management   Orders: TLB-BMP (Basic Metabolic Panel-BMET) (99991111) TLB-CBC Platelet - w/Differential (85025-CBCD) TLB-Hepatic/Liver Function Pnl  (80076-HEPATIC) TLB-TSH (Thyroid Stimulating Hormone) (84443-TSH) TLB-Sedimentation Rate (ESR) (85652-ESR) TLB-IBC Pnl (Iron/FE;Transferrin) (83550-IBC) TLB-B12 + Folate Pnl (82746_82607-B12/FOL)  Problem # 6:  HYPERCHOLESTEROLEMIA (ICD-272.0)  His updated medication list for this problem includes:    Crestor 20 Mg Tabs (Rosuvastatin calcium) .[name]... 1 by mouth once daily  Labs Reviewed: SGOT: 26 (06/27/2009)   SGPT: 18 (06/27/2009)   HDL:46.20 (06/27/2009), 40.4 (06/04/2008)  LDL:121 (06/27/2009), DEL (06/04/2008)  Chol:194 (06/27/2009), 176 (06/04/2008)  Trig:136.0 (06/27/2009), 270 (06/04/2008) stable overall by hx and exam, ok to continue meds/tx as is - for f/u labs today  Pt to continue diet efforts, good med tolerance; to check labs - goal LDL less than 70   Problem # 7:  DM (ICD-250.00)  His updated medication list for this problem includes:    Glucophage Xr 500 Mg Tb24 (Metformin hcl) .[name]... 1 by mouth once daily    Aspir-low 81 Mg Tbec (Aspirin) .[name]... 1po once daily  Labs Reviewed: Creat: 1.4 (06/27/2009)    Reviewed HgBA1c results: 6.0 (06/04/2008)  5.9 (04/11/2007) stable overall by hx and exam, ok to continue meds/tx as is . Pt to cont DM diet, excercise, wt loss efforts; to check labs today   Orders: Prescription Created Electronically [id number]) TLB-Lipid Panel (80061-LIPID) TLB-Microalbumin/Creat Ratio, Urine (82043-MALB) TLB-A1C / Hgb A1C (Glycohemoglobin) (83036-A1C)  Complete Medication List: 1)  Multivitamins Tabs (Multiple vitamin) .... Take 1 by mouth qd 2)  Fish Oil 1000 Mg Caps (Omega-3 fatty acids) .... Take 1 by mouth qd 3)  Garlic A999333 Mg Tbec (Garlic) .... Take 1 by mouth qd 4)  Acyclovir 800 Mg Tabs (Acyclovir) .... Take 1 by mouth once daily 5)  Viagra 100 Mg Tabs (Sildenafil citrate) .[name].. 1 by mouth once daily as needed 6)  Glucophage Xr 500 Mg Tb24 (Metformin hcl) .[name].. 1 by mouth once daily 7)  Cialis 20 Mg Tabs (Tadalafil) .[name].. 1 by mouth every other  day as needed 8)  Crestor 20 Mg Tabs (Rosuvastatin calcium) .[name].. 1 by mouth once daily 9)  Androgel Pump 1.25 Gm/act (1%) Gel (Testosterone) .... 4 pumps per day 10)  Proctosol Hc 2.5 % Crea (Hydrocortisone) .... Use asd  two times a day as needed 11)  Aspir-low 81 Mg Tbec (Aspirin) .[name].. 1po once daily  Other Orders: TLB-PSA (Prostate Specific Antigen) (84153-PSA) TLB-Udip ONLY (81003-UDIP)  Patient Instructions: 1)  You will be contacted about the referral(s) to: thyroid ultrasound 2)  you had the pneumonia shot today 3)  You  will be contacted about the referral(s) to: colonoscopy 4)  Please go to the Lab in the basement for your blood and/or urine tests today 5)  Please call the number on the Central for results of your testing  6)  Please consider calling [facility] for the hernia repair (and even to check the hemorrhoid) 7)  Continue all previous medications as before this visit 8)  Take an Aspirin every day - 81 mg - 1 per day - COATED only 9)  Please schedule a follow-up appointment in 6 months. Prescriptions: VIAGRA 100 MG  TABS (SILDENAFIL CITRATE) 1 by mouth once daily as needed  #3 x 11   Entered and Authorized by:   Biagio Borg MD   Signed by:   Biagio Borg MD on 09/17/2010   Method used:   Electronically to        [facility] (retail)       [address]       [place], [place]  [address]       Ph: [phone]       Fax: [phone]   RxID:   [id number] CRESTOR 20 MG TABS (ROSUVASTATIN CALCIUM) 1 by mouth once daily  #90 x 3   Entered and Authorized by:   Biagio Borg MD   Signed by:   Biagio Borg MD on 09/17/2010   Method used:   Faxed to ...       [facility] (mail-order)             , [place]         Ph: [phone]       Fax: [phone]   RxID:   [id number] GLUCOPHAGE XR 500 MG  TB24 (METFORMIN HCL) 1 by mouth once daily  #90 x 3   Entered and Authorized by:   Biagio Borg MD   Signed by:   Biagio Borg MD  on 09/17/2010   Method used:   Faxed to ...       [facility] (mail-order)             , [place]         Ph: [phone]       Fax: [phone]   [id number] ACYCLOVIR 800 MG  TABS (ACYCLOVIR) take 1 by mouth once daily  #90 x 3   Entered and Authorized by:   Biagio Borg MD   Signed by:   Biagio Borg MD on 09/17/2010   Method used:   Faxed to ...       [facility] (mail-order)             , [place]         Ph: [phone]       Fax: [phone]   RxID:   [id number] GLUCOPHAGE XR 500 MG  TB24 (METFORMIN HCL) 1 by mouth once daily  #90 x 3   Entered and Authorized by:   Biagio Borg MD   Signed by:   Biagio Borg MD on 09/17/2010   Method used:   Electronically to        [facility] (retail)       [address]       [place], [place]  [address]       Ph: [phone]  Fax: [phone]   [id number] CIALIS 20 MG TABS (TADALAFIL) 1 by mouth every other day as needed  #3 x 11   Entered and Authorized by:   Biagio Borg MD   Signed by:   Biagio Borg MD on 09/17/2010   Method used:   Electronically to        [facility] (retail)       [address]       [place], [place]  [address]       Ph: [phone]       Fax: [phone]   RxID:   [id number] CRESTOR 20 MG TABS (ROSUVASTATIN CALCIUM) 1 by mouth once daily  #90 x 3   Entered and Authorized by:   Biagio Borg MD   Signed by:   Biagio Borg MD on 09/17/2010   Method used:   Electronically to        [facility] (retail)       [address]       [place], [place]  [address]       Ph: [phone]       Fax: [phone]   [id number] PROCTOSOL HC 2.5 % CREA (HYDROCORTISONE) use asd  two times a day as needed  #1 x 2   Entered and Authorized by:   Biagio Borg MD   Signed by:   Biagio Borg MD on 09/17/2010   Method used:   Print then Give to Patient   RxID:   [id number]   Appended  Document: Immunization Entry      Immunizations Administered:  Pneumonia Vaccine:    Vaccine Type: Pneumovax    Site: left deltoid    Mfr: Merck    Dose: 0.5 ml    Route: IM    Given by: Shirlean Mylar Ewing CMA ([place])    Exp. Date: 01/17/2012    Lot #: [id number]    VIS given: 12/01/09 version given September 17, 2010.

## 2011-01-26 NOTE — Progress Notes (Signed)
Summary: SWITCH PCP TO DR Xavier Reichmann  Phone Note Call from Patient Call back at [facility] Phone [phone]   Caller: Patient Summary of Call: Xavier Esparza WANTS TO SWITCH PCP'S FROM DR [place]. [place]: [id number] HOME: [phone] Initial call taken by: Glena Norfolk,  August 06, 2010 2:00 PM  Follow-up for Phone Call        ok Follow-up by: Donavan Foil MD,  August 06, 2010 2:20 PM  Additional Follow-up for Phone Call Additional follow up Details #1::        ok Additional Follow-up by: Biagio Borg MD,  August 06, 2010 2:24 PM    Additional Follow-up for Phone Call Additional follow up Details #2::    PT IS AWARE AND SCHEDULED AN APPT FOR A CPX ON SEPT 22 Follow-up by: Glena Norfolk,  August 10, 2010 1:55 PM

## 2011-01-26 NOTE — Progress Notes (Signed)
Summary: Rx refill req  Phone Note Refill Request Message from:  Patient on August 13, 2010 12:00 PM  Refills Requested: Medication #1:  ACYCLOVIR 800 MG  TABS take 1 by mouth qd   Supply Requested: 6 months  Medication #2:  GLUCOPHAGE XR 500 MG  TB24 qd   Supply Requested: 6 months  Medication #3:  CRESTOR 20 MG TABS 1 qd   Supply Requested: 6 months  Method Requested: Electronic Initial call taken by: Crissie Sickles, CMA,  August 13, 2010 12:00 PM    Prescriptions: CRESTOR 20 MG TABS (ROSUVASTATIN CALCIUM) 1 qd  #30 x 5   Entered by:   Crissie Sickles, CMA   Authorized by:   Biagio Borg MD   Signed by:   Crissie Sickles, CMA on 08/13/2010   Method used:   Electronically to        [facility] (retail)       [address]       [place], [place]  [address]       Ph: [phone]       Fax: [phone]   RxID:   [id number] GLUCOPHAGE XR 500 MG  TB24 (METFORMIN HCL) qd  #30 Each x 5   Entered by:   Crissie Sickles, CMA   Authorized by:   Biagio Borg MD   Signed by:   Crissie Sickles, CMA on 08/13/2010   Method used:   Electronically to        [facility] (retail)       [address]       [place], [place]  [address]       Ph: [phone]       Fax: [phone]   [id number] ACYCLOVIR 800 MG  TABS (ACYCLOVIR) take 1 by mouth qd  #30 Each x 5   Entered by:   Crissie Sickles, CMA   Authorized by:   Biagio Borg MD   Signed by:   Crissie Sickles, CMA on 08/13/2010   Method used:   Electronically to        [facility] (retail)       [address]       [place], [place]  [address]       Ph: [phone]       Fax: [phone]   RxID:   [id number]

## 2011-02-01 ENCOUNTER — Encounter (INDEPENDENT_AMBULATORY_CARE_PROVIDER_SITE_OTHER): Payer: Self-pay | Admitting: *Deleted

## 2011-02-11 NOTE — Letter (Signed)
Summary: Referral - not able to see patient  [facility]  [place], [place] [address]   Phone: [phone]  Fax: [phone]    February 01, 2011   Dr [name] [address] [place], [place] [address]   Re:   Xavier Esparza DOB:  [birth year] MRN:   [id number]    Dear Dr. Jenny Reichmann:  Thank you for your kind referral of the above patient.  We have attempted to schedule the recommended procedure Screening Colonoscopy but have not been able to schedule because:  X   The patient was not available by phone and/or has not returned our calls.  ___ The patient declined to schedule the procedure at this time.  We appreciate the referral and hope that we will have the opportunity to treat this patient in the future.    Sincerely,    [facility] [phone]

## 2011-03-15 ENCOUNTER — Encounter: Payer: Self-pay | Admitting: Internal Medicine

## 2011-03-15 ENCOUNTER — Ambulatory Visit (INDEPENDENT_AMBULATORY_CARE_PROVIDER_SITE_OTHER): Payer: Medicare Other | Admitting: Internal Medicine

## 2011-03-15 ENCOUNTER — Other Ambulatory Visit: Payer: Medicare Other

## 2011-03-15 ENCOUNTER — Other Ambulatory Visit: Payer: Self-pay | Admitting: Internal Medicine

## 2011-03-15 DIAGNOSIS — E119 Type 2 diabetes mellitus without complications: Secondary | ICD-10-CM

## 2011-03-15 DIAGNOSIS — E7800 Pure hypercholesterolemia, unspecified: Secondary | ICD-10-CM

## 2011-03-15 DIAGNOSIS — E041 Nontoxic single thyroid nodule: Secondary | ICD-10-CM

## 2011-03-15 DIAGNOSIS — E785 Hyperlipidemia, unspecified: Secondary | ICD-10-CM

## 2011-03-15 DIAGNOSIS — K644 Residual hemorrhoidal skin tags: Secondary | ICD-10-CM

## 2011-03-15 LAB — BASIC METABOLIC PANEL
BUN: 10 mg/dL (ref 6–23)
CO2: 28 mEq/L (ref 19–32)
Chloride: 100 mEq/L (ref 96–112)
Creatinine, Ser: 1.2 mg/dL (ref 0.4–1.5)
Potassium: 4.7 mEq/L (ref 3.5–5.1)

## 2011-03-15 LAB — LIPID PANEL
Total CHOL/HDL Ratio: 4
Triglycerides: 211 mg/dL — ABNORMAL HIGH (ref 0.0–149.0)
VLDL: 42.2 mg/dL — ABNORMAL HIGH (ref 0.0–40.0)

## 2011-03-15 LAB — HEMOGLOBIN A1C: Hgb A1c MFr Bld: 6.1 % (ref 4.6–6.5)

## 2011-03-19 ENCOUNTER — Other Ambulatory Visit (INDEPENDENT_AMBULATORY_CARE_PROVIDER_SITE_OTHER): Payer: Medicare Other

## 2011-03-19 ENCOUNTER — Telehealth: Payer: Self-pay

## 2011-03-19 DIAGNOSIS — E291 Testicular hypofunction: Secondary | ICD-10-CM

## 2011-03-19 NOTE — Telephone Encounter (Signed)
Pt called requesting Testosterone lab order, Okay to scheduled 257.2? When called back pt is requesting to be advised of last BP reading (110/70 LT arm) done on 03/15/2011

## 2011-03-19 NOTE — Telephone Encounter (Signed)
Yes, ok for testosterone as requested

## 2011-03-19 NOTE — Telephone Encounter (Signed)
Pt advised and scheduled for labs

## 2011-03-25 NOTE — Miscellaneous (Signed)
Summary: Orders Update   Clinical Lists Changes  Orders: Added new Test order of TLB-BMP (Basic Metabolic Panel-BMET) (99991111) - Signed Added new Test order of TLB-Lipid Panel (80061-LIPID) - Signed Added new Test order of TLB-A1C / Hgb A1C (Glycohemoglobin) (83036-A1C) - Signed

## 2011-03-25 NOTE — Miscellaneous (Signed)
   Clinical Lists Changes  Orders: Added new Test order of TLB-BMP (Basic Metabolic Panel-BMET) (99991111) - Signed Added new Test order of TLB-Lipid Panel (80061-LIPID) - Signed Added new Test order of TLB-A1C / Hgb A1C (Glycohemoglobin) (83036-A1C) - Signed

## 2011-03-25 NOTE — Assessment & Plan Note (Signed)
Summary: 6 MOS F/U # CD   Vital Signs:  Patient profile:   65 year old male Height:      72 inches Weight:      217.13 pounds BMI:     29.55 O2 Sat:      98 % on Room air Temp:     98.6 degrees F oral Pulse rate:   68 / minute BP sitting:   110 / 70  (left arm) Cuff size:   large  Vitals Entered By: Shirlean Mylar Ewing CMA (AAMA) (March 15, 2011 9:09 AM)  O2 Flow:  Room air  CC: 6 month followup/Re   CC:  6 month followup/Re.  History of Present Illness: here to f/u - overall doing ok;  Pt denies CP, worsening sob, doe, wheezing, orthopnea, pnd, worsening LE edema, palps, dizziness or syncope  Pt denies new neuro symptoms such as headache, facial or extremity weakness Pt denies polydipsia, polyuria, or low sugar symptoms such as shakiness improved with eating.  Overall good compliance with meds, trying to follow low chol, DM diet, wt stable, does have occasional excercise.   Overall good compliance with meds, and good tolerability.  Denies worsening depressive symptoms, suicidal ideation, or panic. and no stressors increased recently.   Problems Prior to Update: 1)  Special Screening Malig Neoplasms Other Sites  (ICD-V76.49) 2)  Preventive Health Care  (ICD-V70.0) 3)  Fatigue  (ICD-780.79) 4)  External Hemorrhoids Without Mention Comp  (ICD-455.3) 5)  Inguinal Hernia  (ICD-550.90) 6)  Preventive Health Care  (ICD-V70.0) 7)  Encounter For Long-term Use of Other Medications  (ICD-V58.69) 8)  Hypercholesterolemia  (ICD-272.0) 9)  Thyroid Nodule, Right  (ICD-241.0) 10)  Routine General Medical Exam@[facility]  (ICD-V70.0) 11)  Hypogonadism, Male  (ICD-257.2) 12)  Dm  (ICD-250.00) 13)  Erectile Dysfunction, Organic  (ICD-607.84)  Medications Prior to Update: 1)  Multivitamins   Tabs (Multiple Vitamin) .... Take 1 By Mouth Qd 2)  Fish Oil 1000 Mg  Caps (Omega-3 Fatty Acids) .... Take 1 By Mouth Qd 3)  Garlic [id number] Mg  Tbec (Garlic) .... Take 1 By Mouth Qd 4)  Acyclovir 800 Mg   Tabs (Acyclovir) .... Take 1 By Mouth Once Daily 5)  Viagra 100 Mg  Tabs (Sildenafil Citrate) .[name].. 1 By Mouth Once Daily As Needed 6)  Glucophage Xr 500 Mg  Tb24 (Metformin Hcl) .[name].. 1 By Mouth Once Daily 7)  Cialis 20 Mg Tabs (Tadalafil) .[name].. 1 By Mouth Every Other Day As Needed 8)  Crestor 20 Mg Tabs (Rosuvastatin Calcium) .[name].. 1 By Mouth Once Daily 9)  Androgel Pump 1.25 Gm/act (1%) Gel (Testosterone) .... 4 Pumps Per Day 10)  Proctosol Hc 2.5 % Crea (Hydrocortisone) .... Use Asd  Two Times A Day As Needed 11)  Aspir-Low 81 Mg Tbec (Aspirin) .[name].. 1po Once Daily  Current Medications (verified): 1)  Multivitamins   Tabs (Multiple Vitamin) .... Take 1 By Mouth Qd 2)  Fish Oil 1000 Mg  Caps (Omega-3 Fatty Acids) .... Take 1 By Mouth Qd 3)  Garlic [id number] Mg  Tbec (Garlic) .... Take 1 By Mouth Qd 4)  Acyclovir 800 Mg  Tabs (Acyclovir) .... Take 1 By Mouth Once Daily 5)  Viagra 100 Mg  Tabs (Sildenafil Citrate) .[name].. 1 By Mouth Once Daily As Needed 6)  Glucophage Xr 500 Mg  Tb24 (Metformin Hcl) .[name].. 1 By Mouth Once Daily 7)  Cialis 20 Mg Tabs (Tadalafil) .[name].. 1 By Mouth Every Other Day As Needed  8)  Crestor 20 Mg Tabs (Rosuvastatin Calcium) .[name].. 1 By Mouth Once Daily 9)  Androgel Pump 1.25 Gm/act (1%) Gel (Testosterone) .... 4 Pumps Per Day 10)  Proctosol Hc 2.5 % Crea (Hydrocortisone) .... Use Asd  Two Times A Day As Needed 11)  Aspir-Low 81 Mg Tbec (Aspirin) .[name].. 1po Once Daily  Allergies (verified): No Known Drug Allergies  Past History:  Past Medical History: Last updated: 09/17/2010 Dyslipidemia E.D. 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   Head:  normocephalic and atraumatic.   Eyes:  vision grossly intact, pupils equal, and pupils round.   Ears:  R ear normal and L ear normal.   Nose:  no external deformity and no nasal discharge.   Mouth:  no gingival abnormalities and pharynx pink and moist.   Neck:  supple and no masses.  with right thyroid nodule nontender, < 1 cm Lungs:  normal respiratory effort and normal breath sounds.   Heart:  normal rate and regular rhythm.   Abdomen:  soft, non-tender, and normal bowel sounds.   Msk:  no joint tenderness and no joint swelling.   Extremities:  no edema, no erythema    Impression & Recommendations:  Problem # 1:  DM (ICD-250.00)  His updated medication list for this problem includes:    Glucophage Xr 500 Mg Tb24 (Metformin hcl) .[name]... 1 by mouth once daily    Aspir-low 81 Mg Tbec (Aspirin) .[name]... 1po once daily  Labs Reviewed: Creat: 1.2 (09/17/2010)    Reviewed HgBA1c results: 6.5 (09/17/2010)  6.0 (06/04/2008) stable overall by hx and exam, ok to continue meds/tx as is .Pt to cont DM diet, excercise, wt control efforts; to check labs today   Problem # 2:  HYPERCHOLESTEROLEMIA (ICD-272.0)  His updated medication list for this problem includes:    Crestor 20 Mg Tabs (Rosuvastatin calcium) .[name]... 1 by mouth once daily with last labs with elev TG as well  - Pt to continue diet efforts, good med tolerance; to check labs - goal LDL less than 70 , consider fenofibrate for elev TG > 200  Labs Reviewed: SGOT: 27 (09/17/2010)   SGPT: 20 (09/17/2010)   HDL:42.90 (09/17/2010), 46.20 (06/27/2009)  LDL:121  (06/27/2009), DEL (06/04/2008)  Chol:159 (09/17/2010), 194 (06/27/2009)  Trig:302.0 (09/17/2010), 136.0 (06/27/2009)  Problem # 3:  THYROID NODULE, RIGHT (ICD-241.0) d/w pt- most recent thyroid yu/s with resolving nodule - ok to follow for now  Problem # 4:  EXTERNAL HEMORRHOIDS WITHOUT MENTION COMP (ICD-455.3) discomfort recurrent; no bleeding, did not examine today, for refil med as  before  Complete Medication List: 1)  Multivitamins Tabs (Multiple vitamin) .... Take 1 by mouth  qd 2)  Fish Oil 1000 Mg Caps (Omega-3 fatty acids) .... Take 1 by mouth qd 3)  Garlic [id number] Mg Tbec (Garlic) .... Take 1 by mouth qd 4)  Acyclovir 800 Mg Tabs (Acyclovir) .... Take 1 by mouth once daily 5)  Viagra 100 Mg Tabs (Sildenafil citrate) .[name].. 1 by mouth once daily as needed 6)  Glucophage Xr 500 Mg Tb24 (Metformin hcl) .[name].. 1 by mouth once daily 7)  Cialis 20 Mg Tabs (Tadalafil) .[name].. 1 by mouth every other day as needed 8)  Crestor 20 Mg Tabs (Rosuvastatin calcium) .[name].. 1 by mouth once daily 9)  Androgel Pump 1.25 Gm/act (1%) Gel (Testosterone) .... 4 pumps per day 10)  Proctosol Hc 2.5 % Crea (Hydrocortisone) .... Use asd  two times a day as needed 11)  Aspir-low 81 Mg Tbec (Aspirin) .[name].. 1po once daily  Patient Instructions: 1)  Continue all previous medications as before this visit  2)  Please go to the Lab in the basement for your blood and/or urine tests today 3)  Please call the number on the [facility] for results of your testing 4)  Please schedule a follow-up appointment in 6 months. Prescriptions: PROCTOSOL HC 2.5 % CREA (HYDROCORTISONE) use asd  two times a day as needed  #1 x 2   Entered and Authorized by:   Biagio Borg MD   Signed by:   Biagio Borg MD on 03/15/2011   Method used:   Print then Give to Patient   RxID:   [id number]    Orders Added: 1)  Est. Patient Level IV [id number]

## 2011-07-23 ENCOUNTER — Other Ambulatory Visit: Payer: Self-pay | Admitting: Internal Medicine

## 2011-09-19 ENCOUNTER — Encounter: Payer: Self-pay | Admitting: Internal Medicine

## 2011-09-19 DIAGNOSIS — Z0000 Encounter for general adult medical examination without abnormal findings: Secondary | ICD-10-CM | POA: Insufficient documentation

## 2011-09-20 ENCOUNTER — Other Ambulatory Visit: Payer: Self-pay

## 2011-09-20 MED ORDER — METFORMIN HCL ER 500 MG PO TB24: 500.0000 mg | ORAL_TABLET | Freq: Every day | ORAL | Status: DC

## 2011-09-21 ENCOUNTER — Other Ambulatory Visit (INDEPENDENT_AMBULATORY_CARE_PROVIDER_SITE_OTHER): Payer: Medicare Other

## 2011-09-21 ENCOUNTER — Ambulatory Visit (INDEPENDENT_AMBULATORY_CARE_PROVIDER_SITE_OTHER): Payer: Medicare Other | Admitting: Internal Medicine

## 2011-09-21 ENCOUNTER — Ambulatory Visit: Payer: Medicare Other | Admitting: Internal Medicine

## 2011-09-21 ENCOUNTER — Encounter: Payer: Self-pay | Admitting: Internal Medicine

## 2011-09-21 VITALS — BP 100/60 | HR 62 | Temp 98.5°F | Ht 72.0 in | Wt 208.4 lb

## 2011-09-21 DIAGNOSIS — E7800 Pure hypercholesterolemia, unspecified: Secondary | ICD-10-CM

## 2011-09-21 DIAGNOSIS — R5383 Other fatigue: Secondary | ICD-10-CM

## 2011-09-21 DIAGNOSIS — E291 Testicular hypofunction: Secondary | ICD-10-CM

## 2011-09-21 DIAGNOSIS — N320 Bladder-neck obstruction: Secondary | ICD-10-CM

## 2011-09-21 DIAGNOSIS — E119 Type 2 diabetes mellitus without complications: Secondary | ICD-10-CM

## 2011-09-21 DIAGNOSIS — Z0000 Encounter for general adult medical examination without abnormal findings: Secondary | ICD-10-CM

## 2011-09-21 DIAGNOSIS — K4090 Unilateral inguinal hernia, without obstruction or gangrene, not specified as recurrent: Secondary | ICD-10-CM

## 2011-09-21 DIAGNOSIS — R5381 Other malaise: Secondary | ICD-10-CM

## 2011-09-21 DIAGNOSIS — Z23 Encounter for immunization: Secondary | ICD-10-CM

## 2011-09-21 LAB — URINALYSIS, ROUTINE W REFLEX MICROSCOPIC
Bilirubin Urine: NEGATIVE
Ketones, ur: NEGATIVE
Leukocytes, UA: NEGATIVE
Nitrite: NEGATIVE
Specific Gravity, Urine: 1.02 (ref 1.000–1.030)
Urobilinogen, UA: 0.2 (ref 0.0–1.0)

## 2011-09-21 LAB — MICROALBUMIN / CREATININE URINE RATIO
Creatinine,U: 200.6 mg/dL
Microalb, Ur: 0.7 mg/dL (ref 0.0–1.9)

## 2011-09-21 LAB — CBC WITH DIFFERENTIAL/PLATELET
Basophils Absolute: 0 10*3/uL (ref 0.0–0.1)
HCT: 47.9 % (ref 39.0–52.0)
Lymphocytes Relative: 39.8 % (ref 12.0–46.0)
Lymphs Abs: 2.7 10*3/uL (ref 0.7–4.0)
Monocytes Relative: 6.2 % (ref 3.0–12.0)
Neutrophils Relative %: 51.7 % (ref 43.0–77.0)
Platelets: 250 10*3/uL (ref 150.0–400.0)
RDW: 15.8 % — ABNORMAL HIGH (ref 11.5–14.6)

## 2011-09-21 LAB — LIPID PANEL
Cholesterol: 150 mg/dL (ref 0–200)
HDL: 47.4 mg/dL
LDL Cholesterol: 70 mg/dL (ref 0–99)
Total CHOL/HDL Ratio: 3
Triglycerides: 161 mg/dL — ABNORMAL HIGH (ref 0.0–149.0)
VLDL: 32.2 mg/dL (ref 0.0–40.0)

## 2011-09-21 LAB — HEPATIC FUNCTION PANEL
ALT: 20 U/L (ref 0–53)
AST: 27 U/L (ref 0–37)
Albumin: 3.8 g/dL (ref 3.5–5.2)
Alkaline Phosphatase: 51 U/L (ref 39–117)
Total Protein: 6.9 g/dL (ref 6.0–8.3)

## 2011-09-21 LAB — BASIC METABOLIC PANEL
BUN: 9 mg/dL (ref 6–23)
Chloride: 104 mEq/L (ref 96–112)
GFR: 97.13 mL/min (ref >60.00)
Potassium: 4.8 mEq/L (ref 3.5–5.1)
Sodium: 142 mEq/L (ref 135–145)

## 2011-09-21 LAB — TSH: TSH: 0.54 u[IU]/mL (ref 0.35–5.50)

## 2011-09-21 NOTE — Assessment & Plan Note (Signed)
ECG reviewed as per emr, Etiology unclear, Exam otherwise benign, to check labs as documented, follow with expectant management

## 2011-09-21 NOTE — Assessment & Plan Note (Signed)
Right side stable, pt declines surgury referral at this time

## 2011-09-21 NOTE — Patient Instructions (Addendum)
You had the flu shot today You will be contacted regarding the referral for: colonoscopy Continue all other medications as before You have the cialis sample today Please go to LAB in the Basement for the blood and/or urine tests to be done today Please call the phone number [phone] (the [employer]) for results of testing in 2-3 days;  When calling, simply dial the number, and when prompted enter the MRN number above (the Medical Record Number) and the # key, then the message should start. Please return in 1 year for your yearly visit, or sooner if needed Please call if you need general surgeon referral

## 2011-09-26 ENCOUNTER — Encounter: Payer: Self-pay | Admitting: Internal Medicine

## 2011-09-26 NOTE — Assessment & Plan Note (Signed)
stable overall by hx and exam, most recent data reviewed with pt, and pt to continue medical treatment as before  Lab Results  Component Value Date   LDLCALC 70 09/21/2011

## 2011-09-26 NOTE — Assessment & Plan Note (Signed)
stable overall by hx and exam, most recent data reviewed with pt, and pt to continue medical treatment as before  Lab Results  Component Value Date   HGBA1C 6.4 09/21/2011

## 2011-09-26 NOTE — Progress Notes (Signed)
Subjective:    Patient ID: Xavier Esparza, male    DOB: 09-14-1946, 65 y.o.   MRN: [id number]  HPI  Here to f/u; overall doing ok,  Pt denies chest pain, increased sob or doe, wheezing, orthopnea, PND, increased LE swelling, palpitations, dizziness or syncope.  Pt denies new neurological symptoms such as new headache, or facial or extremity weakness or numbness   Pt denies polydipsia, polyuria, or low sugar symptoms such as weakness or confusion improved with po intake.  Pt states overall good compliance with meds, trying to follow lower cholesterol diet, wt overall down 12 lbs with better diet.  Has ongoing RIH, no pain, but swelling persists, still trying to consider whether he wants surgury.  Does have sense of ongoing fatigue, but denies signficant hypersomnolence. Past Medical History  Diagnosis Date  . Dyslipidemia   . Erectile dysfunction   . Hyperglycemia   . Herpes labialis     Upper Lip  . Male hypogonadism   . Thyroid nodule     Right   No past surgical history on file.  reports that he quit smoking about 22 years ago. He does not have any smokeless tobacco history on file. He reports that he drinks alcohol. He reports that he does not use illicit drugs. family history is not on file.  He is adopted. No Known Allergies Current Outpatient Prescriptions on File Prior to Visit  Medication Sig Dispense Refill  . acyclovir (ZOVIRAX) 800 MG tablet TAKE ONE TABLET BY MOUTH EVERY DAY  90 tablet  3  . aspirin 81 MG tablet Take 81 mg by mouth daily.        . hydrocortisone (PROCTOSOL HC) 2.5 % rectal cream Place rectally 2 (two) times daily. As needed       . metFORMIN (GLUCOPHAGE-XR) 500 MG 24 hr tablet Take 1 tablet (500 mg total) by mouth daily.  90 tablet  0  . Multiple Vitamin (MULTIVITAMIN) tablet Take 1 tablet by mouth daily.        . Omega-3 Fatty Acids (FISH OIL) 1000 MG CAPS Take by mouth daily.        . rosuvastatin (CRESTOR) 20 MG tablet Take 20 mg by mouth daily.        .  sildenafil (VIAGRA) 100 MG tablet Take 100 mg by mouth daily as needed.        . tadalafil (CIALIS) 20 MG tablet Take 20 mg by mouth every other day. As needed       . Testosterone (ANDROGEL PUMP) 1.25 GM/ACT GEL Place onto the skin. 4 pumps daily        Review of Systems Review of Systems  Constitutional: Negative for diaphoresis and unexpected weight change.  HENT: Negative for drooling and tinnitus.   Eyes: Negative for photophobia and visual disturbance.  Respiratory: Negative for choking and stridor.   Gastrointestinal: Negative for vomiting and blood in stool.  Genitourinary: Negative for hematuria and decreased urine volume.  Musculoskeletal: Negative for gait problem.  Skin: Negative for color change and wound.  Neurological: Negative for tremors and numbness.  Psychiatric/Behavioral: Negative for decreased concentration. The patient is not hyperactive.       Objective:   Physical Exam Physical Exam  VS noted, not ill appaering Constitutional: Pt appears well-developed and well-nourished.  HENT: Head: Normocephalic.  Right Ear: External ear normal.  Left Ear: External ear normal.  Eyes: Conjunctivae and EOM are normal. Pupils are equal, round, and reactive to light.  Neck: Normal  range of motion. Neck supple.  Cardiovascular: Normal rate and regular rhythm.   Pulmonary/Chest: Effort normal and breath sounds normal.  Abd:  Soft, NT, non-distended, + BS Neurological: Pt is alert. No cranial nerve deficit.  Skin: Skin is warm. No erythema.  Psychiatric: Pt behavior is normal. Thought content normal.  RIH reducible, nontender       Assessment & Plan:

## 2011-09-26 NOTE — Assessment & Plan Note (Signed)
asympt - also for PSA

## 2011-09-26 NOTE — Assessment & Plan Note (Signed)
For testosterone as well

## 2011-10-01 ENCOUNTER — Other Ambulatory Visit: Payer: Self-pay

## 2011-10-01 MED ORDER — METFORMIN HCL ER 500 MG PO TB24: 500.0000 mg | ORAL_TABLET | Freq: Every day | ORAL | Status: DC

## 2011-10-05 ENCOUNTER — Other Ambulatory Visit: Payer: Medicare Other

## 2011-10-05 ENCOUNTER — Ambulatory Visit (INDEPENDENT_AMBULATORY_CARE_PROVIDER_SITE_OTHER): Payer: Medicare Other

## 2011-10-05 ENCOUNTER — Telehealth: Payer: Self-pay | Admitting: Internal Medicine

## 2011-10-05 DIAGNOSIS — Z2911 Encounter for prophylactic immunotherapy for respiratory syncytial virus (RSV): Secondary | ICD-10-CM

## 2011-10-05 DIAGNOSIS — Z23 Encounter for immunization: Secondary | ICD-10-CM

## 2011-10-05 DIAGNOSIS — E291 Testicular hypofunction: Secondary | ICD-10-CM

## 2011-10-05 NOTE — Telephone Encounter (Signed)
Done per emr 

## 2011-10-05 NOTE — Telephone Encounter (Signed)
Message copied by Biagio Borg on Tue Oct 05, 2011  3:43 PM ------      Message from: Sharon Seller B      Created: Tue Oct 05, 2011  3:08 PM      Regarding: lab       Patient is coming in this afternoon for his shingles shot and would also like to go to the lab and have his testosterone level checked.

## 2011-10-06 LAB — TESTOSTERONE, FREE, TOTAL, SHBG
Sex Hormone Binding: 31 nmol/L (ref 13–71)
Testosterone, Free: 40.7 pg/mL — ABNORMAL LOW (ref 47.0–244.0)
Testosterone-% Free: 2 % (ref 1.6–2.9)

## 2011-11-15 ENCOUNTER — Other Ambulatory Visit: Payer: Self-pay

## 2011-11-15 MED ORDER — ROSUVASTATIN CALCIUM 20 MG PO TABS: 20.0000 mg | ORAL_TABLET | Freq: Every day | ORAL | Status: DC

## 2011-11-30 ENCOUNTER — Ambulatory Visit (INDEPENDENT_AMBULATORY_CARE_PROVIDER_SITE_OTHER): Payer: Medicare Other | Admitting: Endocrinology

## 2011-11-30 ENCOUNTER — Encounter: Payer: Self-pay | Admitting: Endocrinology

## 2011-11-30 ENCOUNTER — Ambulatory Visit: Payer: Medicare Other | Admitting: Endocrinology

## 2011-11-30 DIAGNOSIS — E041 Nontoxic single thyroid nodule: Secondary | ICD-10-CM

## 2011-11-30 DIAGNOSIS — N390 Urinary tract infection, site not specified: Secondary | ICD-10-CM

## 2011-11-30 DIAGNOSIS — E042 Nontoxic multinodular goiter: Secondary | ICD-10-CM

## 2011-11-30 NOTE — Progress Notes (Signed)
  Subjective:    Patient ID: Xavier Esparza, male    DOB: 12-26-1946, 65 y.o.   MRN: [id number]  HPI Pt states 10 days of moderate dry-quality cough in the chest, and assoc chills.  The cough is resolving, but myalgias and fatigue persist.  He has slight nasal congestion.   Pt has h/o euthyroid multinodular goiter Past Medical History  Diagnosis Date  . Dyslipidemia   . Erectile dysfunction   . Hyperglycemia   . Herpes labialis     Upper Lip  . Male hypogonadism   . Thyroid nodule     Right    No past surgical history on file.  History   Social History  . Marital Status: Married    Spouse Name: N/A    Number of Children: N/A  . Years of Education: N/A   Occupational History  . Not on file.   Social History Main Topics  . Smoking status: Former Smoker    Quit date: 12/27/1988  . Smokeless tobacco: Not on file  . Alcohol Use: Yes     Rare Socially  . Drug Use: No  . Sexually Active:    Other Topics Concern  . Not on file   Social History Narrative  . No narrative on file    Current Outpatient Prescriptions on File Prior to Visit  Medication Sig Dispense Refill  . acyclovir (ZOVIRAX) 800 MG tablet TAKE ONE TABLET BY MOUTH EVERY DAY  90 tablet  3  . aspirin 81 MG tablet Take 81 mg by mouth daily.        . hydrocortisone (PROCTOSOL HC) 2.5 % rectal cream Place rectally 2 (two) times daily. As needed       . metFORMIN (GLUCOPHAGE-XR) 500 MG 24 hr tablet Take 1 tablet (500 mg total) by mouth daily.  90 tablet  1  . Multiple Vitamin (MULTIVITAMIN) tablet Take 1 tablet by mouth daily.        . Omega-3 Fatty Acids (FISH OIL) 1000 MG CAPS Take by mouth daily.        . rosuvastatin (CRESTOR) 20 MG tablet Take 1 tablet (20 mg total) by mouth daily.  90 tablet  3  . sildenafil (VIAGRA) 100 MG tablet Take 100 mg by mouth daily as needed.        . tadalafil (CIALIS) 20 MG tablet Take 20 mg by mouth every other day. As needed       . Testosterone (ANDROGEL PUMP) 1.25 GM/ACT GEL  Place onto the skin. 4 pumps daily         No Known Allergies  Family History  Problem Relation Age of Onset  . Adopted: Yes   BP 122/72  Pulse 81  Temp(Src) 99.4 F (37.4 C) (Oral)  Ht 6' (1.829 m)  Wt 210 lb 6.4 oz (95.437 kg)  BMI 28.54 kg/m2  SpO2 96%  Review of Systems Denies fever and sob.    Objective:   Physical Exam VITAL SIGNS:  See vs page GENERAL: no distress head: no deformity eyes: no periorbital swelling, no proptosis external nose and ears are normal mouth: no lesion seen Both eac's and tm's are normal Neck: thyroid is full on the right, but i can't tell details.   LUNGS:  Clear to auscultation       Assessment & Plan:  [name], new Multinodular goiter, which is usually hereditary

## 2011-11-30 NOTE — Patient Instructions (Addendum)
most of the time, a "lumpy thyroid" will eventually become overactive.  this is usually a slow process, happening over the span of many years.  Dr Jenny Reichmann checks your blood test each year.  If the thyroid become overactive, please come back to see me. Loratadine (non-prescription) will help your nasal drainage. I hope you feel better soon.  If you don't feel better by next week, please call your doctor.

## 2011-12-01 ENCOUNTER — Ambulatory Visit: Payer: Medicare Other | Admitting: Internal Medicine

## 2011-12-01 ENCOUNTER — Other Ambulatory Visit: Payer: Self-pay | Admitting: *Deleted

## 2011-12-01 MED ORDER — PROMETHAZINE-CODEINE 6.25-10 MG/5ML PO SYRP: 5.0000 mL | ORAL_SOLUTION | ORAL | Status: AC | PRN

## 2011-12-01 NOTE — Telephone Encounter (Signed)
i printed 

## 2011-12-01 NOTE — Telephone Encounter (Signed)
Pt states that hydrocodone cough syrup was discussed at yesterday's OV, pt now would like rx sent to [place] since his coughing spells have returned.

## 2011-12-01 NOTE — Telephone Encounter (Signed)
Rx faxed to [facility], pt informed.

## 2011-12-09 ENCOUNTER — Telehealth: Payer: Self-pay

## 2011-12-09 NOTE — Telephone Encounter (Signed)
Pt called requesting MD advisement on the name of surgeon that he would be recommended to if he did decide to pursue surgery for inguinal hernia.  I advised that many general surgery referral go to [facility] but but pt wants to be certain that this is in fact who MD recommends, please advise.

## 2011-12-09 NOTE — Telephone Encounter (Signed)
Patient informed. 

## 2011-12-09 NOTE — Telephone Encounter (Signed)
Yes, this type of surgury is done per [facility] and [facility] is the only group in town;  There are other in [place] or [facility] but I would not think he would have to go there for this

## 2011-12-24 ENCOUNTER — Ambulatory Visit (INDEPENDENT_AMBULATORY_CARE_PROVIDER_SITE_OTHER): Payer: Medicare Other | Admitting: Surgery

## 2011-12-24 ENCOUNTER — Encounter (INDEPENDENT_AMBULATORY_CARE_PROVIDER_SITE_OTHER): Payer: Self-pay | Admitting: Surgery

## 2011-12-24 ENCOUNTER — Other Ambulatory Visit (INDEPENDENT_AMBULATORY_CARE_PROVIDER_SITE_OTHER): Payer: Self-pay | Admitting: Surgery

## 2011-12-24 VITALS — BP 132/86 | HR 80 | Temp 97.2°F | Resp 16 | Ht 72.0 in | Wt 213.4 lb

## 2011-12-24 DIAGNOSIS — K4090 Unilateral inguinal hernia, without obstruction or gangrene, not specified as recurrent: Secondary | ICD-10-CM

## 2011-12-24 NOTE — Progress Notes (Signed)
Chief Complaint:  Right inguinal hernia  History of Present Illness:  Xavier Esparza is an 65 y.o. male with a 1 year history of RIH.  Now more symptomatic with intermittent pain.  No history of obstruction  Past Medical History  Diagnosis Date  . Dyslipidemia   . Erectile dysfunction   . Hyperglycemia   . Herpes labialis     Upper Lip  . Male hypogonadism   . Thyroid nodule     Right  . Hyperlipidemia     History reviewed. No pertinent past surgical history.  Current Outpatient Prescriptions  Medication Sig Dispense Refill  . acyclovir (ZOVIRAX) 800 MG tablet TAKE ONE TABLET BY MOUTH EVERY DAY  90 tablet  3  . aspirin 81 MG tablet Take 81 mg by mouth daily.        . hydrocortisone (PROCTOSOL HC) 2.5 % rectal cream Place rectally 2 (two) times daily. As needed       . metFORMIN (GLUCOPHAGE-XR) 500 MG 24 hr tablet Take 1 tablet (500 mg total) by mouth daily.  90 tablet  1  . Multiple Vitamin (MULTIVITAMIN) tablet Take 1 tablet by mouth daily.        . Omega-3 Fatty Acids (FISH OIL) 1000 MG CAPS Take by mouth daily.        . rosuvastatin (CRESTOR) 20 MG tablet Take 1 tablet (20 mg total) by mouth daily.  90 tablet  3  . sildenafil (VIAGRA) 100 MG tablet Take 100 mg by mouth daily as needed.        . tadalafil (CIALIS) 20 MG tablet Take 20 mg by mouth every other day. As needed       . Testosterone (ANDROGEL PUMP) 1.25 GM/ACT GEL Place onto the skin. 4 pumps daily        Review of patient's allergies indicates no known allergies. Family History  Problem Relation Age of Onset  . Adopted: Yes   Social History:   reports that he quit smoking about 21 years ago. He does not have any smokeless tobacco history on file. He reports that he drinks alcohol. He reports that he does not use illicit drugs.   REVIEW OF SYSTEMS - PERTINENT POSITIVES ONLY: High cholesterol  Physical Exam:   Blood pressure 132/86, pulse 80, temperature 97.2 F (36.2 C), temperature source Temporal, resp. rate  16, height 6' (1.829 m), weight 213 lb 6.4 oz (96.798 kg). Body mass index is 28.94 kg/(m^2).  Gen:  WDWN AA man NAD  Neurological: Alert and oriented to person, place, and time. Motor and sensory function is grossly intact  Head: Normocephalic and atraumatic.  Eyes: Conjunctivae are normal. Pupils are equal, round, and reactive to light. No scleral icterus.  Neck: Normal range of motion. Neck supple. No tracheal deviation or thyromegaly present.  Cardiovascular:  SR without murmurs or gallops.  No carotid bruits Respiratory: Effort normal.  No respiratory distress. No chest wall tenderness. Breath sounds normal.  No wheezes, rales or rhonchi.  Abdomen:  Prominent RIH reducible.  Testes nl.  No left inguinal hernia GU: Musculoskeletal: Normal range of motion. Extremities are nontender. No cyanosis, edema or clubbing noted Lymphadenopathy: No cervical, preauricular, postauricular or axillary adenopathy is present Skin: Skin is warm and dry. No rash noted. No diaphoresis. No erythema. No pallor. Pscyh: Normal mood and affect. Behavior is normal. Judgment and thought content normal.   LABORATORY RESULTS: No results found for this or any previous visit (from the past 48 hour(s)).  RADIOLOGY  RESULTS: No results found.  Problem List: Patient Active Problem List  Diagnoses  . DM  . HYPOGONADISM, MALE  . HYPERCHOLESTEROLEMIA  . EXTERNAL HEMORRHOIDS WITHOUT MENTION COMP  . INGUINAL HERNIA  . ERECTILE DYSFUNCTION, ORGANIC  . FATIGUE  . Preventative health care  . Fatigue  . Bladder neck obstruction  . Multinodular goiter    Assessment & Plan: RIH in a younger than stated age 1 man with DM followed by Xavier Esparza I have discussed lap and open right inguinal hernias with him. Gave him a booklet on hernia repair. We will plan to do an open right inguinal hernia repair with mesh. Schedule at term day surgery under general as an outpatient.    Xavier Esparza Done, MD, [facility] [phone] beeper [phone]  12/24/2011 11:39 AM

## 2012-01-10 ENCOUNTER — Encounter (HOSPITAL_BASED_OUTPATIENT_CLINIC_OR_DEPARTMENT_OTHER)
Admission: RE | Admit: 2012-01-10 | Discharge: 2012-01-10 | Disposition: A | Discharge status: Home / Self Care | Payer: Medicare Other | Source: Ambulatory Visit | Attending: Surgery | Admitting: Surgery

## 2012-01-10 ENCOUNTER — Encounter (HOSPITAL_BASED_OUTPATIENT_CLINIC_OR_DEPARTMENT_OTHER): Payer: Self-pay | Admitting: *Deleted

## 2012-01-10 DIAGNOSIS — E119 Type 2 diabetes mellitus without complications: Secondary | ICD-10-CM | POA: Diagnosis not present

## 2012-01-10 DIAGNOSIS — K4090 Unilateral inguinal hernia, without obstruction or gangrene, not specified as recurrent: Secondary | ICD-10-CM | POA: Diagnosis not present

## 2012-01-10 DIAGNOSIS — Z01812 Encounter for preprocedural laboratory examination: Secondary | ICD-10-CM | POA: Diagnosis not present

## 2012-01-10 LAB — BASIC METABOLIC PANEL
Chloride: 104 mEq/L (ref 96–112)
GFR calc Af Amer: >90 mL/min (ref >90)
Potassium: 4.7 mEq/L (ref 3.5–5.1)

## 2012-01-10 NOTE — Progress Notes (Signed)
To come in for bmet ekg done 9/12 in epic

## 2012-01-11 ENCOUNTER — Encounter (HOSPITAL_BASED_OUTPATIENT_CLINIC_OR_DEPARTMENT_OTHER): Payer: Self-pay | Admitting: Anesthesiology

## 2012-01-11 ENCOUNTER — Encounter (HOSPITAL_BASED_OUTPATIENT_CLINIC_OR_DEPARTMENT_OTHER)
Admission: RE | Disposition: A | Discharge status: Home / Self Care | Payer: Self-pay | Source: Ambulatory Visit | Attending: Surgery

## 2012-01-11 ENCOUNTER — Ambulatory Visit (HOSPITAL_BASED_OUTPATIENT_CLINIC_OR_DEPARTMENT_OTHER)
Admission: RE | Admit: 2012-01-11 | Discharge: 2012-01-11 | Disposition: A | Discharge status: Home / Self Care | Payer: Medicare Other | Source: Ambulatory Visit | Attending: Surgery | Admitting: Surgery

## 2012-01-11 ENCOUNTER — Ambulatory Visit (HOSPITAL_BASED_OUTPATIENT_CLINIC_OR_DEPARTMENT_OTHER): Payer: Medicare Other | Admitting: Anesthesiology

## 2012-01-11 ENCOUNTER — Encounter (HOSPITAL_BASED_OUTPATIENT_CLINIC_OR_DEPARTMENT_OTHER): Payer: Self-pay | Admitting: *Deleted

## 2012-01-11 DIAGNOSIS — Z01812 Encounter for preprocedural laboratory examination: Secondary | ICD-10-CM | POA: Insufficient documentation

## 2012-01-11 DIAGNOSIS — K4090 Unilateral inguinal hernia, without obstruction or gangrene, not specified as recurrent: Secondary | ICD-10-CM | POA: Insufficient documentation

## 2012-01-11 DIAGNOSIS — R1084 Generalized abdominal pain: Secondary | ICD-10-CM | POA: Diagnosis not present

## 2012-01-11 DIAGNOSIS — G8918 Other acute postprocedural pain: Secondary | ICD-10-CM | POA: Diagnosis not present

## 2012-01-11 DIAGNOSIS — E119 Type 2 diabetes mellitus without complications: Secondary | ICD-10-CM | POA: Diagnosis not present

## 2012-01-11 HISTORY — PX: INGUINAL HERNIA REPAIR: SHX194

## 2012-01-11 LAB — GLUCOSE, CAPILLARY: Glucose-Capillary: 102 mg/dL — ABNORMAL HIGH (ref 70–99)

## 2012-01-11 LAB — POCT HEMOGLOBIN-HEMACUE: Hemoglobin: 12.8 g/dL — ABNORMAL LOW (ref 13.0–17.0)

## 2012-01-11 SURGERY — Surgical Case
Anesthesia: *Unknown

## 2012-01-11 SURGERY — REPAIR, HERNIA, INGUINAL, ADULT
Anesthesia: General | Laterality: Right

## 2012-01-11 MED ORDER — ACETAMINOPHEN 650 MG RE SUPP: 650.0000 mg | RECTAL | Status: DC | PRN

## 2012-01-11 MED ORDER — SODIUM CHLORIDE 0.9 % IV SOLN: 250.0000 mL | INTRAVENOUS | Status: DC | PRN

## 2012-01-11 MED ORDER — PROMETHAZINE HCL 25 MG/ML IJ SOLN: 12.5000 mg | Freq: Four times a day (QID) | INTRAMUSCULAR | Status: DC | PRN

## 2012-01-11 MED ORDER — MORPHINE SULFATE 2 MG/ML IJ SOLN: 0.0500 mg/kg | INTRAMUSCULAR | Status: DC | PRN

## 2012-01-11 MED ORDER — FENTANYL CITRATE 0.05 MG/ML IJ SOLN
50.0000 ug | INTRAMUSCULAR | Status: DC | PRN
  Administered 2012-01-11: 100 ug via INTRAVENOUS

## 2012-01-11 MED ORDER — CEFAZOLIN SODIUM-DEXTROSE 2-3 GM-% IV SOLR
2.0000 g | INTRAVENOUS | Status: AC
  Administered 2012-01-11: 2 g via INTRAVENOUS

## 2012-01-11 MED ORDER — SODIUM CHLORIDE 0.9 % IJ SOLN: 3.0000 mL | Freq: Two times a day (BID) | INTRAMUSCULAR | Status: DC

## 2012-01-11 MED ORDER — SODIUM CHLORIDE 0.9 % IJ SOLN: 3.0000 mL | INTRAMUSCULAR | Status: DC | PRN

## 2012-01-11 MED ORDER — OXYCODONE HCL 5 MG PO TABS: 5.0000 mg | ORAL_TABLET | ORAL | Status: DC | PRN

## 2012-01-11 MED ORDER — METOCLOPRAMIDE HCL 5 MG/ML IJ SOLN: 10.0000 mg | Freq: Once | INTRAMUSCULAR | Status: DC | PRN

## 2012-01-11 MED ORDER — LACTATED RINGERS IV SOLN
INTRAVENOUS | Status: DC | PRN
  Administered 2012-01-11 (×2): via INTRAVENOUS

## 2012-01-11 MED ORDER — HEPARIN SODIUM (PORCINE) 5000 UNIT/ML IJ SOLN
5000.0000 [IU] | Freq: Once | INTRAMUSCULAR | Status: AC
  Administered 2012-01-11: 5000 [IU] via SUBCUTANEOUS

## 2012-01-11 MED ORDER — BUPIVACAINE-EPINEPHRINE PF 0.5-1:200000 % IJ SOLN
INTRAMUSCULAR | Status: DC | PRN
  Administered 2012-01-11: 25 mL

## 2012-01-11 MED ORDER — OXYCODONE-ACETAMINOPHEN 5-325 MG PO TABS: 1.0000 | ORAL_TABLET | ORAL | Status: AC | PRN

## 2012-01-11 MED ORDER — LIDOCAINE HCL (PF) 1 % IJ SOLN
INTRAMUSCULAR | Status: DC | PRN
  Administered 2012-01-11: 2 mL

## 2012-01-11 MED ORDER — CEFAZOLIN SODIUM 1-5 GM-% IV SOLN: 1.0000 g | INTRAVENOUS | Status: DC

## 2012-01-11 MED ORDER — ACETAMINOPHEN 325 MG PO TABS: 650.0000 mg | ORAL_TABLET | ORAL | Status: DC | PRN

## 2012-01-11 MED ORDER — FENTANYL CITRATE 0.05 MG/ML IJ SOLN
INTRAMUSCULAR | Status: DC | PRN
  Administered 2012-01-11 (×2): 25 ug via INTRAVENOUS
  Administered 2012-01-11: 50 ug via INTRAVENOUS

## 2012-01-11 MED ORDER — ACETAMINOPHEN 10 MG/ML IV SOLN
1000.0000 mg | Freq: Once | INTRAVENOUS | Status: AC
  Administered 2012-01-11: 1000 mg via INTRAVENOUS

## 2012-01-11 MED ORDER — ONDANSETRON HCL 4 MG/2ML IJ SOLN: 4.0000 mg | Freq: Four times a day (QID) | INTRAMUSCULAR | Status: DC | PRN

## 2012-01-11 MED ORDER — CHLORHEXIDINE GLUCONATE 4 % EX LIQD: 1.0000 "application " | Freq: Once | CUTANEOUS | Status: DC

## 2012-01-11 MED ORDER — BUPIVACAINE HCL (PF) 0.25 % IJ SOLN
INTRAMUSCULAR | Status: DC | PRN
  Administered 2012-01-11: 7 mL

## 2012-01-11 MED ORDER — FENTANYL CITRATE 0.05 MG/ML IJ SOLN: 25.0000 ug | INTRAMUSCULAR | Status: DC | PRN

## 2012-01-11 MED ORDER — LACTATED RINGERS IV SOLN
INTRAVENOUS | Status: DC
  Administered 2012-01-11: 15:00:00 via INTRAVENOUS

## 2012-01-11 MED ORDER — PROPOFOL 10 MG/ML IV BOLUS
INTRAVENOUS | Status: DC | PRN
  Administered 2012-01-11: 200 mg via INTRAVENOUS

## 2012-01-11 MED ORDER — MIDAZOLAM HCL 2 MG/2ML IJ SOLN
0.5000 mg | INTRAMUSCULAR | Status: DC | PRN
  Administered 2012-01-11: 2 mg via INTRAVENOUS

## 2012-01-11 SURGICAL SUPPLY — 51 items
ADH SKN CLS APL DERMABOND .7 (GAUZE/BANDAGES/DRESSINGS)
APL SKNCLS STERI-STRIP NONHPOA (GAUZE/BANDAGES/DRESSINGS)
BENZOIN TINCTURE PRP APPL 2/3 (GAUZE/BANDAGES/DRESSINGS) IMPLANT
BLADE SURG 15 STRL LF DISP TIS (BLADE) ×1 IMPLANT
BLADE SURG 15 STRL SS (BLADE) ×2
BLADE SURG ROTATE 9660 (MISCELLANEOUS) ×1 IMPLANT
CANISTER SUCTION 1200CC (MISCELLANEOUS) ×2 IMPLANT
CLEANER CAUTERY TIP 5X5 PAD (MISCELLANEOUS) ×1 IMPLANT
CLOTH BEACON ORANGE TIMEOUT ST (SAFETY) ×2 IMPLANT
COVER MAYO STAND STRL (DRAPES) ×2 IMPLANT
COVER TABLE BACK 60X90 (DRAPES) ×2 IMPLANT
DECANTER SPIKE VIAL GLASS SM (MISCELLANEOUS) ×1 IMPLANT
DERMABOND ADVANCED (GAUZE/BANDAGES/DRESSINGS)
DERMABOND ADVANCED .7 DNX12 (GAUZE/BANDAGES/DRESSINGS) IMPLANT
DRAIN PENROSE 1/2X12 LTX STRL (WOUND CARE) ×2 IMPLANT
DRAPE LAPAROTOMY T 102X78X121 (DRAPES) ×2 IMPLANT
ELECT REM PT RETURN 9FT ADLT (ELECTROSURGICAL) ×2
ELECTRODE REM PT RTRN 9FT ADLT (ELECTROSURGICAL) ×1 IMPLANT
GAUZE SPONGE 4X4 12PLY STRL LF (GAUZE/BANDAGES/DRESSINGS) ×4 IMPLANT
GAUZE SPONGE 4X4 16PLY XRAY LF (GAUZE/BANDAGES/DRESSINGS) IMPLANT
GLOVE BIO SURGEON STRL SZ8 (GLOVE) ×2 IMPLANT
GOWN PREVENTION PLUS XLARGE (GOWN DISPOSABLE) ×2 IMPLANT
GOWN PREVENTION PLUS XXLARGE (GOWN DISPOSABLE) ×2 IMPLANT
MESH HERNIA 3X6 (Mesh General) ×1 IMPLANT
NDL HYPO 25X1 1.5 SAFETY (NEEDLE) IMPLANT
NEEDLE HYPO 25X1 1.5 SAFETY (NEEDLE) ×2 IMPLANT
NS IRRIG 1000ML POUR BTL (IV SOLUTION) ×2 IMPLANT
PACK BASIN DAY SURGERY FS (CUSTOM PROCEDURE TRAY) ×2 IMPLANT
PAD CLEANER CAUTERY TIP 5X5 (MISCELLANEOUS) ×1
PENCIL BUTTON HOLSTER BLD 10FT (ELECTRODE) ×2 IMPLANT
SLEEVE SCD COMPRESS KNEE MED (MISCELLANEOUS) ×1 IMPLANT
SPONGE LAP 4X18 X RAY DECT (DISPOSABLE) ×1 IMPLANT
STAPLER VISISTAT 35W (STAPLE) IMPLANT
STRIP CLOSURE SKIN 1/2X4 (GAUZE/BANDAGES/DRESSINGS) IMPLANT
SUT MON AB 5-0 PS2 18 (SUTURE) ×1 IMPLANT
SUT PROLENE 0 CT 1 30 (SUTURE) IMPLANT
SUT PROLENE 2 0 CT2 30 (SUTURE) ×5 IMPLANT
SUT SILK 2 0 SH (SUTURE) IMPLANT
SUT VIC AB 2-0 SH 27 (SUTURE) ×2
SUT VIC AB 2-0 SH 27XBRD (SUTURE) ×1 IMPLANT
SUT VIC AB 4-0 SH 18 (SUTURE) ×2 IMPLANT
SUT VIC AB 5-0 P-3 18X BRD (SUTURE) IMPLANT
SUT VIC AB 5-0 P3 18 (SUTURE)
SUT VICRYL 4-0 PS2 18IN ABS (SUTURE) IMPLANT
SYR BULB 3OZ (MISCELLANEOUS) ×2 IMPLANT
SYR CONTROL 10ML LL (SYRINGE) ×1 IMPLANT
TOWEL OR 17X24 6PK STRL BLUE (TOWEL DISPOSABLE) ×4 IMPLANT
TRAY DSU PREP LF (CUSTOM PROCEDURE TRAY) ×2 IMPLANT
TUBE CONNECTING 20X1/4 (TUBING) ×2 IMPLANT
WATER STERILE IRR 1000ML POUR (IV SOLUTION) ×1 IMPLANT
YANKAUER SUCT BULB TIP NO VENT (SUCTIONS) ×2 IMPLANT

## 2012-01-11 NOTE — Anesthesia Postprocedure Evaluation (Signed)
Anesthesia Post Note  Patient: Xavier Esparza  Procedure(s) Performed:  HERNIA REPAIR INGUINAL ADULT - Open right inguinal hernia repair with mesh  Anesthesia type: General  Patient location: PACU  Post pain: Pain level controlled  Post assessment: Patient's Cardiovascular Status Stable  Last Vitals:  Filed Vitals:   01/11/12 1500  BP: 150/84  Pulse: 63  Temp: 36.4 C  Resp: 16    Post vital signs: Reviewed and stable  Level of consciousness: alert  Complications: No apparent anesthesia complications

## 2012-01-11 NOTE — Anesthesia Preprocedure Evaluation (Signed)
Anesthesia Evaluation  Patient identified by MRN, date of birth, ID band Patient awake    Reviewed: Allergy & Precautions, H&P , NPO status , Patient's Chart, lab work & pertinent test results, reviewed documented beta blocker date and time   Airway Mallampati: II TM Distance: >3 FB Neck ROM: full    Dental   Pulmonary neg pulmonary ROS,          Cardiovascular On Medications neg cardio ROS     Neuro/Psych PSYCHIATRIC DISORDERS Negative Neurological ROS     GI/Hepatic negative GI ROS, Neg liver ROS,   Endo/Other  Diabetes mellitus-  Renal/GU negative Renal ROS  Genitourinary negative   Musculoskeletal   Abdominal   Peds  Hematology negative hematology ROS (+)   Anesthesia Other Findings See surgeon's H&P   Reproductive/Obstetrics negative OB ROS                           Anesthesia Physical Anesthesia Plan  ASA: II  Anesthesia Plan: General   Post-op Pain Management: MAC Combined w/ Regional for Post-op pain   Induction: Intravenous  Airway Management Planned: LMA  Additional Equipment:   Intra-op Plan:   Post-operative Plan: Extubation in OR  Informed Consent: I have reviewed the patients History and Physical, chart, labs and discussed the procedure including the risks, benefits and alternatives for the proposed anesthesia with the patient or authorized representative who has indicated his/her understanding and acceptance.     Plan Discussed with: CRNA and Surgeon  Anesthesia Plan Comments:         Anesthesia Quick Evaluation

## 2012-01-11 NOTE — Op Note (Signed)
Surgeon: Kaylyn Lim, MD, FACS  Asst:  none  Anes:  General  Procedure: Open right inguinal hernia  Diagnosis: Right direct inguinal hernia  Complications: none  EBL:   minimal cc  Description of Procedure:  The patient was taken to OR 8 at CDS and given general by LMA.  Prepped by me with PCMX and draped and time out performed.  Oblique incision performed and external oblique identified.  External fibers incised and cord mobilized off of a very large direct defect.  The defect was imbricated with 2-0 prolene completely.  The cord was checked and no indirect hernia seen.  Marlex type mesh was used to repair the floor suturing it to the inguinal ligament and the rectus medially and around the cord.  It was tacked to itself and tucked beneath the external oblique fascia.  7 cc marcaine 0.25 injected and closed in layers with 4-0 vicryl and 5-0 monocryl.  Dermabond used on skin.    [name] Done, [place], [facility], [place]

## 2012-01-11 NOTE — Transfer of Care (Signed)
Immediate Anesthesia Transfer of Care Note  Patient: Xavier Esparza  Procedure(s) Performed:  HERNIA REPAIR INGUINAL ADULT - Open right inguinal hernia repair with mesh  Patient Location: PACU  Anesthesia Type: GA combined with regional for post-op pain  Level of Consciousness: sedated  Airway & Oxygen Therapy: Patient Spontanous Breathing and Patient connected to face mask oxygen  Post-op Assessment: Report given to PACU RN  Post vital signs: Reviewed and stable Filed Vitals:   01/11/12 1230  BP: 113/78  Pulse: 60  Temp:   Resp: 11    Complications: No apparent anesthesia complications

## 2012-01-11 NOTE — Progress Notes (Signed)
Assisted Dr. Albertina Parr with right, transabdominal plane block. Side rails up, monitors on throughout procedure. See vital signs in flow sheet. Tolerated Procedure well.

## 2012-01-11 NOTE — H&P (Addendum)
Xavier Earls, MD 12/24/2011 11:46 AM Signed  Chief Complaint: Right inguinal hernia  History of Present Illness: Xavier Esparza is an 66 y.o. male with a 1 year history of RIH. Now more symptomatic with intermittent pain. No history of obstruction  Past Medical History   Diagnosis  Date   .  Dyslipidemia    .  Erectile dysfunction    .  Hyperglycemia    .  Herpes labialis      Upper Lip   .  Male hypogonadism    .  Thyroid nodule      Right   .  Hyperlipidemia     History reviewed. No pertinent past surgical history.  Current Outpatient Prescriptions   Medication  Sig  Dispense  Refill   .  acyclovir (ZOVIRAX) 800 MG tablet  TAKE ONE TABLET BY MOUTH EVERY DAY  90 tablet  3   .  aspirin 81 MG tablet  Take 81 mg by mouth daily.     .  hydrocortisone (PROCTOSOL HC) 2.5 % rectal cream  Place rectally 2 (two) times daily. As needed     .  metFORMIN (GLUCOPHAGE-XR) 500 MG 24 hr tablet  Take 1 tablet (500 mg total) by mouth daily.  90 tablet  1   .  Multiple Vitamin (MULTIVITAMIN) tablet  Take 1 tablet by mouth daily.     .  Omega-3 Fatty Acids (FISH OIL) 1000 MG CAPS  Take by mouth daily.     .  rosuvastatin (CRESTOR) 20 MG tablet  Take 1 tablet (20 mg total) by mouth daily.  90 tablet  3   .  sildenafil (VIAGRA) 100 MG tablet  Take 100 mg by mouth daily as needed.     .  tadalafil (CIALIS) 20 MG tablet  Take 20 mg by mouth every other day. As needed     .  Testosterone (ANDROGEL PUMP) 1.25 GM/ACT GEL  Place onto the skin. 4 pumps daily      Review of patient's allergies indicates no known allergies.  Family History   Problem  Relation  Age of Onset   .  Adopted: Yes    Social History: reports that he quit smoking about 21 years ago. He does not have any smokeless tobacco history on file. He reports that he drinks alcohol. He reports that he does not use illicit drugs.  REVIEW OF SYSTEMS - PERTINENT POSITIVES ONLY:  High cholesterol  Physical Exam:  Blood pressure 132/86, pulse 80,  temperature 97.2 F (36.2 C), temperature source Temporal, resp. rate 16, height 6' (1.829 m), weight 213 lb 6.4 oz (96.798 kg).  Body mass index is 28.94 kg/(m^2).  Gen: WDWN AA man NAD  Neurological: Alert and oriented to person, place, and time. Motor and sensory function is grossly intact  Head: Normocephalic and atraumatic.  Eyes: Conjunctivae are normal. Pupils are equal, round, and reactive to light. No scleral icterus.  Neck: Normal range of motion. Neck supple. No tracheal deviation or thyromegaly present.  Cardiovascular: SR without murmurs or gallops. No carotid bruits  Respiratory: Effort normal. No respiratory distress. No chest wall tenderness. Breath sounds normal. No wheezes, rales or rhonchi.  Abdomen: Prominent RIH reducible. Testes nl. No left inguinal hernia  GU:  Musculoskeletal: Normal range of motion. Extremities are nontender. No cyanosis, edema or clubbing noted Lymphadenopathy: No cervical, preauricular, postauricular or axillary adenopathy is present Skin: Skin is warm and dry. No rash noted. No diaphoresis. No erythema. No pallor. Pscyh:  Normal mood and affect. Behavior is normal. Judgment and thought content normal.  LABORATORY RESULTS:  No results found for this or any previous visit (from the past 48 hour(s)).  RADIOLOGY RESULTS:  No results found.  Problem List:  Patient Active Problem List   Diagnoses   .  DM   .  HYPOGONADISM, MALE   .  HYPERCHOLESTEROLEMIA   .  EXTERNAL HEMORRHOIDS WITHOUT MENTION COMP   .  INGUINAL HERNIA   .  ERECTILE DYSFUNCTION, ORGANIC   .  FATIGUE   .  Preventative health care   .  Fatigue   .  Bladder neck obstruction   .  Multinodular goiter    Assessment & Plan:  RIH in a younger than stated age 53 man with DM followed by Xavier Esparza  I have discussed lap and open right inguinal hernias with him. Gave him a booklet on hernia repair. We will plan to do an open right inguinal hernia repair with mesh. Schedule at term day  surgery under general as an outpatient.  Xavier B. Hassell Done, MD, [facility]  [phone] beeper  [phone] There has been no change in the patient's past medical history or physical exam in the past 24 hours to the best of my knowledge.  Expectations and outcome results have been discussed with the patient to include risks and benefits.  All questions have been answered and will proceed with previously discussed procedure noted and signed in the consent form in the patient's record.    Xavier Esparza BMD @NOW  01/11/2012

## 2012-01-11 NOTE — Anesthesia Procedure Notes (Addendum)
Anesthesia Regional Block:  TAP block  Pre-Anesthetic Checklist: ,, timeout performed, Correct Patient, Correct Site, Correct Laterality, Correct Procedure, Correct Position, site marked, Risks and benefits discussed,  Surgical consent,  Pre-op evaluation,  At surgeon's request and post-op pain management  Laterality: Right  Prep: chloraprep       Needles:  Injection technique: Single-shot  Needle Type: Echogenic Needle     Needle Length: 9cm  Needle Gauge: 21    Additional Needles:  Procedures: ultrasound guided TAP block Narrative:  Start time: 01/11/2012 12:24 PM End time: 01/11/2012 12:31 PM Injection made incrementally with aspirations every 5 mL.  Performed by: Personally  Anesthesiologist: Nada Libman, MD  Additional Notes: Ultrasound guidance used to: id relevant anatomy, confirm needle position, local anesthetic spread, avoidance of vascular puncture. Picture saved. No complications. Block performed personally by Jessy Oto. Albertina Parr, MD    TAP block Procedure Name: LMA Insertion Date/Time: 01/11/2012 12:43 PM Performed by: Johny Shock Pre-anesthesia Checklist: Patient identified, Emergency Drugs available, Suction available and Patient being monitored Patient Re-evaluated:Patient Re-evaluated prior to inductionOxygen Delivery Method: Circle System Utilized Preoxygenation: Pre-oxygenation with 100% oxygen Intubation Type: IV induction Ventilation: Mask ventilation without difficulty LMA: LMA inserted LMA Size: 5.0 Number of attempts: 1 Placement Confirmation: positive ETCO2 and breath sounds checked- equal and bilateral Tube secured with: Tape Dental Injury: Teeth and Oropharynx as per pre-operative assessment

## 2012-01-12 ENCOUNTER — Encounter (HOSPITAL_BASED_OUTPATIENT_CLINIC_OR_DEPARTMENT_OTHER): Payer: Self-pay | Admitting: Surgery

## 2012-01-17 ENCOUNTER — Telehealth (INDEPENDENT_AMBULATORY_CARE_PROVIDER_SITE_OTHER): Payer: Self-pay | Admitting: Surgery

## 2012-01-17 NOTE — Telephone Encounter (Signed)
Returned the patients call, he is wondering if he has any limitations, explained to him no lifting or pulling. He would like to go back to work for 1/2 days starting wed 01/19/12. Told him i would check and get back in touch with him.

## 2012-01-19 ENCOUNTER — Other Ambulatory Visit: Payer: Medicare Other | Admitting: Internal Medicine

## 2012-01-25 ENCOUNTER — Telehealth (INDEPENDENT_AMBULATORY_CARE_PROVIDER_SITE_OTHER): Payer: Self-pay | Admitting: Surgery

## 2012-01-25 NOTE — Telephone Encounter (Signed)
Contacted the patient and expressed to him that I was out of the office Friday 01/21/12 and Monday 01/24/12, pt desires RTW note for 01/27/12 for 1/2 day schedule. Told him I would consult with Dr Hassell Done and return his call 01/26/12

## 2012-01-26 ENCOUNTER — Encounter (INDEPENDENT_AMBULATORY_CARE_PROVIDER_SITE_OTHER): Payer: Self-pay | Admitting: General Surgery

## 2012-02-11 ENCOUNTER — Encounter (INDEPENDENT_AMBULATORY_CARE_PROVIDER_SITE_OTHER): Payer: Self-pay | Admitting: Surgery

## 2012-02-11 ENCOUNTER — Ambulatory Visit (INDEPENDENT_AMBULATORY_CARE_PROVIDER_SITE_OTHER): Payer: Medicare Other | Admitting: Surgery

## 2012-02-11 VITALS — BP 132/78 | HR 68 | Temp 97.2°F | Resp 18 | Ht 72.0 in | Wt 215.0 lb

## 2012-02-11 DIAGNOSIS — K4090 Unilateral inguinal hernia, without obstruction or gangrene, not specified as recurrent: Secondary | ICD-10-CM

## 2012-02-11 NOTE — Progress Notes (Signed)
Xavier Esparza 66 y.o.  Body mass index is 29.16 kg/(m^2).  Patient Active Problem List  Diagnoses  . DM  . HYPOGONADISM, MALE  . HYPERCHOLESTEROLEMIA  . EXTERNAL HEMORRHOIDS WITHOUT MENTION COMP  . INGUINAL HERNIA  . ERECTILE DYSFUNCTION, ORGANIC  . FATIGUE  . Preventative health care  . Fatigue  . Bladder neck obstruction  . Multinodular goiter    No Known Allergies  Past Surgical History  Procedure Date  . No past surgeries   . Inguinal hernia repair 01/11/2012    Procedure: HERNIA REPAIR INGUINAL ADULT;  Surgeon: Xavier Earls, MD;  Location: [facility];  Service: General;  Laterality: Right;  Open right inguinal hernia repair with mesh   Xavier Cower, MD, MD No diagnosis found.  Xavier Esparza is getting along very well. He will gradually increase his activities until about 6 weeks when he should be back to full exercise capability. His incision looks good and is feeling fine. There is no evidence for recurrence and examined no pain. Plan return p.r.n. Xavier B. Hassell Done, MD, [facility] [phone] beeper [phone]  02/11/2012 3:17 PM

## 2012-06-14 ENCOUNTER — Other Ambulatory Visit: Payer: Self-pay | Admitting: Internal Medicine

## 2012-06-24 ENCOUNTER — Other Ambulatory Visit: Payer: Self-pay | Admitting: Internal Medicine

## 2012-08-10 DIAGNOSIS — E291 Testicular hypofunction: Secondary | ICD-10-CM | POA: Diagnosis not present

## 2012-08-10 DIAGNOSIS — N401 Enlarged prostate with lower urinary tract symptoms: Secondary | ICD-10-CM | POA: Diagnosis not present

## 2012-08-21 ENCOUNTER — Other Ambulatory Visit: Payer: Self-pay | Admitting: Internal Medicine

## 2012-09-21 ENCOUNTER — Ambulatory Visit (INDEPENDENT_AMBULATORY_CARE_PROVIDER_SITE_OTHER): Payer: Medicare Other | Admitting: Internal Medicine

## 2012-09-21 ENCOUNTER — Encounter: Payer: Self-pay | Admitting: Internal Medicine

## 2012-09-21 ENCOUNTER — Other Ambulatory Visit (INDEPENDENT_AMBULATORY_CARE_PROVIDER_SITE_OTHER): Payer: Medicare Other

## 2012-09-21 VITALS — BP 120/70 | HR 67 | Temp 98.6°F | Ht 71.0 in | Wt 212.0 lb

## 2012-09-21 DIAGNOSIS — Z0000 Encounter for general adult medical examination without abnormal findings: Secondary | ICD-10-CM

## 2012-09-21 DIAGNOSIS — E119 Type 2 diabetes mellitus without complications: Secondary | ICD-10-CM | POA: Diagnosis not present

## 2012-09-21 DIAGNOSIS — N320 Bladder-neck obstruction: Secondary | ICD-10-CM

## 2012-09-21 DIAGNOSIS — Z23 Encounter for immunization: Secondary | ICD-10-CM

## 2012-09-21 DIAGNOSIS — E7800 Pure hypercholesterolemia, unspecified: Secondary | ICD-10-CM | POA: Diagnosis not present

## 2012-09-21 DIAGNOSIS — E042 Nontoxic multinodular goiter: Secondary | ICD-10-CM

## 2012-09-21 DIAGNOSIS — R5381 Other malaise: Secondary | ICD-10-CM | POA: Diagnosis not present

## 2012-09-21 DIAGNOSIS — R5383 Other fatigue: Secondary | ICD-10-CM | POA: Diagnosis not present

## 2012-09-21 DIAGNOSIS — Z136 Encounter for screening for cardiovascular disorders: Secondary | ICD-10-CM | POA: Diagnosis not present

## 2012-09-21 DIAGNOSIS — E291 Testicular hypofunction: Secondary | ICD-10-CM

## 2012-09-21 LAB — HEPATIC FUNCTION PANEL
ALT: 24 U/L (ref 0–53)
Bilirubin, Direct: 0.1 mg/dL (ref 0.0–0.3)
Total Bilirubin: 0.8 mg/dL (ref 0.3–1.2)

## 2012-09-21 LAB — CBC WITH DIFFERENTIAL/PLATELET
Basophils Relative: 0.5 % (ref 0.0–3.0)
Eosinophils Absolute: 0.1 10*3/uL (ref 0.0–0.7)
HCT: 46.4 % (ref 39.0–52.0)
Hemoglobin: 14.9 g/dL (ref 13.0–17.0)
Lymphs Abs: 3.2 10*3/uL (ref 0.7–4.0)
MCHC: 32 g/dL (ref 30.0–36.0)
MCV: 85.1 fl (ref 78.0–100.0)
Monocytes Absolute: 0.6 10*3/uL (ref 0.1–1.0)
Neutro Abs: 5.1 10*3/uL (ref 1.4–7.7)
Neutrophils Relative %: 55.9 % (ref 43.0–77.0)
RBC: 5.45 Mil/uL (ref 4.22–5.81)

## 2012-09-21 LAB — URINALYSIS, ROUTINE W REFLEX MICROSCOPIC
Bilirubin Urine: NEGATIVE
Hgb urine dipstick: NEGATIVE
Ketones, ur: NEGATIVE
Urine Glucose: NEGATIVE
Urobilinogen, UA: 0.2 (ref 0.0–1.0)

## 2012-09-21 LAB — MICROALBUMIN / CREATININE URINE RATIO: Microalb, Ur: 0.8 mg/dL (ref 0.0–1.9)

## 2012-09-21 LAB — LIPID PANEL
Cholesterol: 159 mg/dL (ref 0–200)
Total CHOL/HDL Ratio: 4
VLDL: 48.8 mg/dL — ABNORMAL HIGH (ref 0.0–40.0)

## 2012-09-21 LAB — BASIC METABOLIC PANEL
Chloride: 101 mEq/L (ref 96–112)
Creatinine, Ser: 1.1 mg/dL (ref 0.4–1.5)

## 2012-09-21 NOTE — Patient Instructions (Addendum)
You had the flu shot today Please go to LAB in the Basement for the blood and/or urine tests to be done today You will be contacted by phone if any changes need to be made immediately.  Otherwise, you will receive a letter about your results with an explanation. Please remember to sign up for My Chart at your earliest convenience, as this will be important to you in the future with finding out test results. You will be contacted regarding the referral for: colonoscopy, and thyroid ultrasound Please continue your efforts at being more active, low cholesterol diet, and weight control. Please return in 1 year for your yearly visit, or sooner if needed

## 2012-09-21 NOTE — Assessment & Plan Note (Addendum)
With some decreased size of nodule last check 2011, for f/u u/s per pt reqeust Lab Results  Component Value Date   TSH 0.54 09/21/2011

## 2012-09-21 NOTE — Assessment & Plan Note (Signed)
Also for psa, cont cialis dialy

## 2012-09-21 NOTE — Assessment & Plan Note (Signed)
stable overall by hx and exam, most recent data reviewed with pt, and pt to continue medical treatment as before . Lab Results  Component Value Date   LDLCALC 70 09/21/2011   For f/u ldl today

## 2012-09-21 NOTE — Assessment & Plan Note (Addendum)
Not charged, but due for colnoscopy - to be ordered

## 2012-09-21 NOTE — Assessment & Plan Note (Signed)
stable overall by hx and exam except has lost some muscle mass per pt, most recent data reviewed with pt, and pt to continue medical treatment as before, for testosterone check

## 2012-09-21 NOTE — Assessment & Plan Note (Addendum)
Etiology unclear, Exam otherwise benign, to check labs as documented, follow with expectant management  Note:  Total time for pt hx, exam, review of record with pt in the room, determination of diagnoses and plan for further eval and tx is > 40 min, with over 50% spent in coordination and counseling of patient  

## 2012-09-21 NOTE — Assessment & Plan Note (Addendum)
ECG reviewed as per emr, stable overall by hx and exam, most recent data reviewed with pt, and pt to continue medical treatment as before Lab Results  Component Value Date   HGBA1C 6.4 09/21/2011

## 2012-09-21 NOTE — Progress Notes (Signed)
Subjective:    Patient ID: Xavier Esparza, male    DOB: 01-31-1946, 66 y.o.   MRN: [id number]  HPI Here to f/u; overall doing ok,  Pt denies chest pain, increased sob or doe, wheezing, orthopnea, PND, increased LE swelling, palpitations, dizziness or syncope.  Pt denies new neurological symptoms such as new headache, or facial or extremity weakness or numbness   Pt denies polydipsia, polyuria, or low sugar symptoms such as weakness or confusion improved with po intake.  Pt states overall good compliance with meds, trying to follow lower cholesterol, diabetic diet, wt overall stable but little exercise however.  Has been on daily cialis x 24mo per urology, seems to be helping.   Pt denies fever, wt loss, night sweats, loss of appetite, or other constitutional symptoms  No acute complaints.  Wt has increased from 208 to 212 in the past yr.  Now on cialis 5 mg daily but only recent start per urology, not clear if has improved symptoms except ED has improved.  Does have sense of ongoing fatigue, but denies signficant hypersomnolence.  Has lost some muscle mass overall in recent yrs. Past Medical History  Diagnosis Date  . Dyslipidemia   . Hyperglycemia   . Herpes labialis     Upper Lip  . Male hypogonadism   . Thyroid nodule     Right  . Hyperlipidemia   . Diabetes mellitus   . Erectile dysfunction    Past Surgical History  Procedure Date  . No past surgeries   . Inguinal hernia repair 01/11/2012    Procedure: HERNIA REPAIR INGUINAL ADULT;  Surgeon: Pedro Earls, MD;  Location: [facility];  Service: General;  Laterality: Right;  Open right inguinal hernia repair with mesh    reports that he quit smoking about 21 years ago. He has never used smokeless tobacco. He reports that he drinks alcohol. He reports that he does not use illicit drugs. family history is not on file.  He is adopted. No Known Allergies Current Outpatient Prescriptions on File Prior to Visit  Medication Sig  Dispense Refill  . acyclovir (ZOVIRAX) 800 MG tablet TAKE ONE TABLET BY MOUTH EVERY DAY  90 tablet  0  . aspirin 81 MG tablet Take 81 mg by mouth daily.        . CRESTOR 20 MG tablet TAKE ONE TABLET BY MOUTH EVERY DAY  30 each  3  . hydrocortisone (PROCTOSOL HC) 2.5 % rectal cream Place rectally 2 (two) times daily. As needed       . metFORMIN (GLUCOPHAGE-XR) 500 MG 24 hr tablet Take 1 tablet (500 mg total) by mouth daily.  90 tablet  1  . Multiple Vitamin (MULTIVITAMIN) tablet Take 1 tablet by mouth daily.        . sildenafil (VIAGRA) 100 MG tablet Take 100 mg by mouth daily as needed.        . Testosterone (ANDROGEL PUMP) 1.25 GM/ACT GEL Place onto the skin. 4 pumps daily        Review of Systems  Constitutional: Negative for diaphoresis and unexpected weight change.  HENT: Negative for tinnitus.   Eyes: Negative for photophobia and visual disturbance.  Respiratory: Negative for choking and stridor.   Gastrointestinal: Negative for vomiting and blood in stool.  Genitourinary: Negative for hematuria and decreased urine volume.  Musculoskeletal: Negative for gait problem.  Skin: Negative for color change and wound.  Neurological: Negative for tremors and numbness.  Psychiatric/Behavioral: Negative for  decreased concentration. The patient is not hyperactive.       Objective:   Physical Exam BP 120/70  Pulse 67  Temp 98.6 F (37 C) (Oral)  Ht 5\' 11"  (1.803 m)  Wt 212 lb (96.163 kg)  BMI 29.57 kg/m2  SpO2 96% Physical Exam  VS noted Constitutional: Pt appears well-developed and well-nourished.  HENT: Head: Normocephalic.  Right Ear: External ear normal.  Left Ear: External ear normal. Nodular thyroid without dominant nodule Eyes: Conjunctivae and EOM are normal. Pupils are equal, round, and reactive to light.  Neck: Normal range of motion. Neck supple.  Cardiovascular: Normal rate and regular rhythm.   Pulmonary/Chest: Effort normal and breath sounds normal.  Abd:  Soft, NT,  non-distended, + BS Neurological: Pt is alert. Not confused  Skin: Skin is warm. No erythema.  Psychiatric: Pt behavior is normal. Thought content normal.     Assessment & Plan:

## 2012-09-22 ENCOUNTER — Encounter: Payer: Self-pay | Admitting: Internal Medicine

## 2012-09-22 LAB — TESTOSTERONE, FREE, TOTAL, SHBG
Sex Hormone Binding: 32 nmol/L (ref 13–71)
Testosterone, Free: 53.3 pg/mL (ref 47.0–244.0)
Testosterone-% Free: 2 % (ref 1.6–2.9)

## 2012-09-22 LAB — LDL CHOLESTEROL, DIRECT: Direct LDL: 71.2 mg/dL

## 2012-09-26 ENCOUNTER — Other Ambulatory Visit: Payer: Self-pay | Admitting: Internal Medicine

## 2012-10-04 ENCOUNTER — Ambulatory Visit
Admission: RE | Admit: 2012-10-04 | Discharge: 2012-10-04 | Disposition: A | Discharge status: Home / Self Care | Payer: Medicare Other | Source: Ambulatory Visit | Attending: Internal Medicine | Admitting: Internal Medicine

## 2012-10-04 ENCOUNTER — Encounter: Payer: Self-pay | Admitting: Internal Medicine

## 2012-10-04 DIAGNOSIS — E042 Nontoxic multinodular goiter: Secondary | ICD-10-CM

## 2012-10-04 IMAGING — US US SOFT TISSUE HEAD/NECK
1 series · 14 of 25 positions shown · non-contrast
Comparison: [DATE]

CLINICAL DATA: Thyroid nodule

THYROID ULTRASOUND
TECHNIQUE: Ultrasound examination of the thyroid gland and
adjacent soft tissues was performed.

[Series 1: us soft tissue head/neck · 0.08mm/px · 14 of 50 slices shown]
[im 1/50]
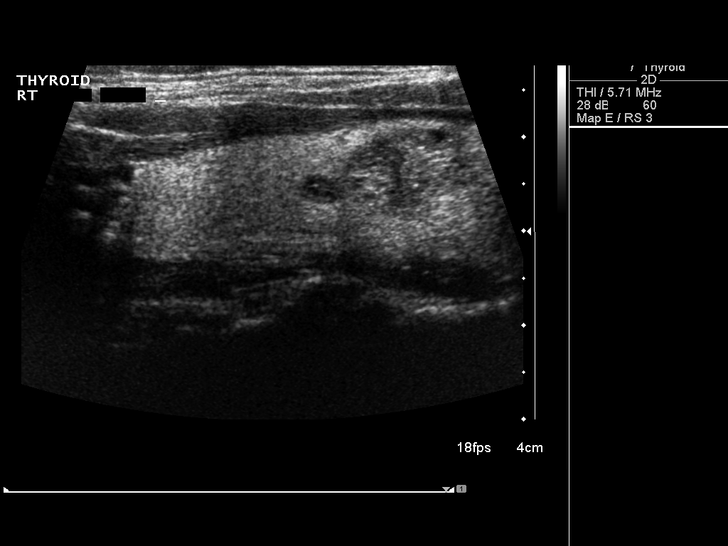
[im 5/50]
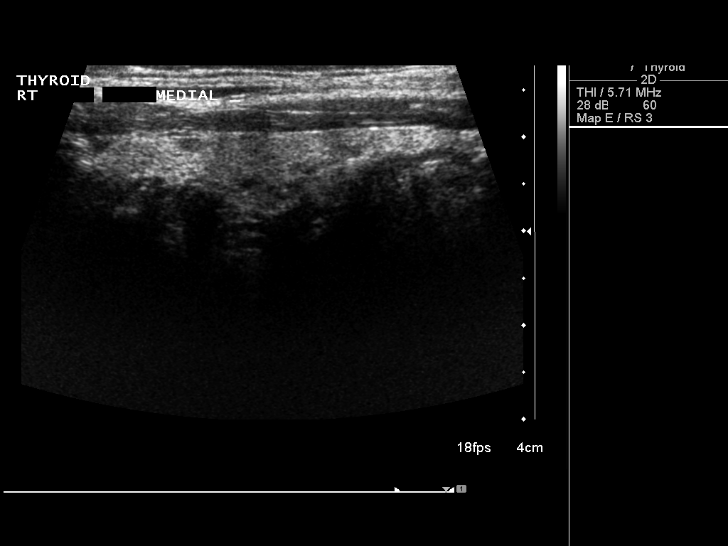
[im 9/50]
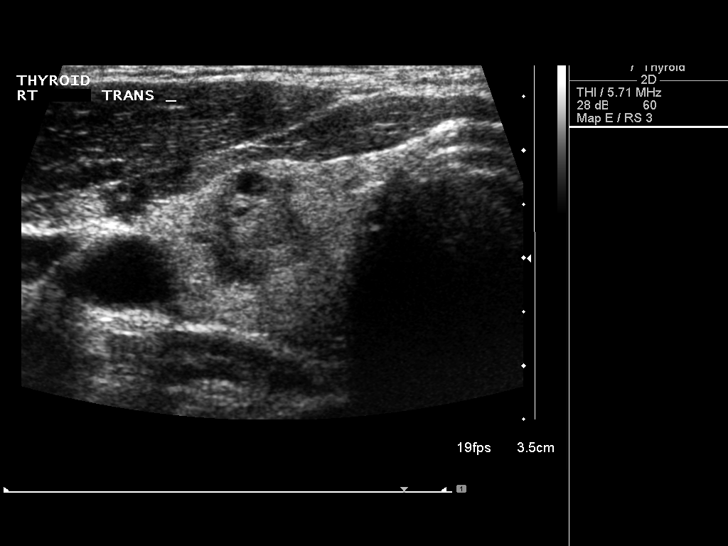
[im 13/50]
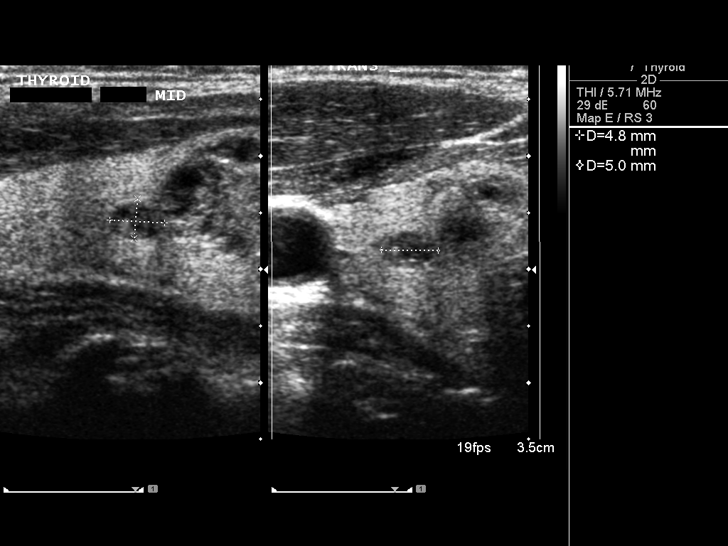
[im 17/50]
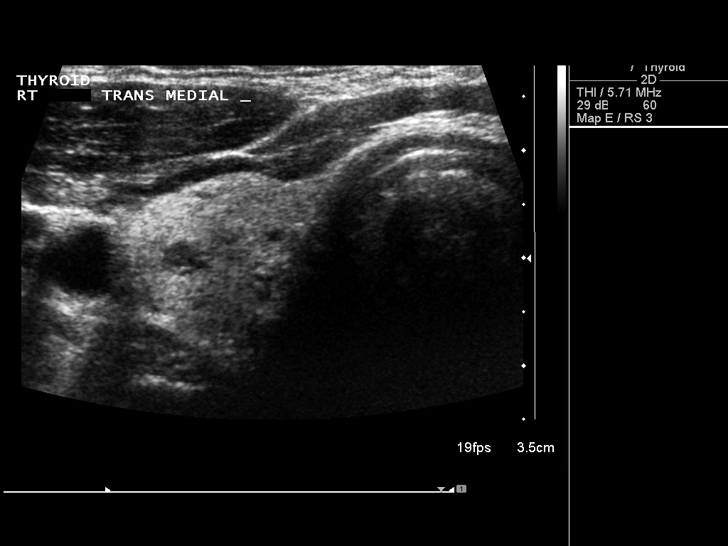
[im 19/50]
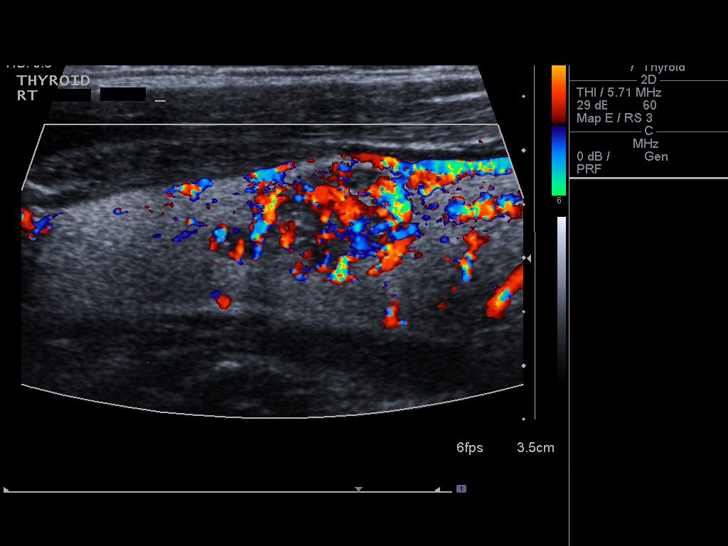
[im 23/50]
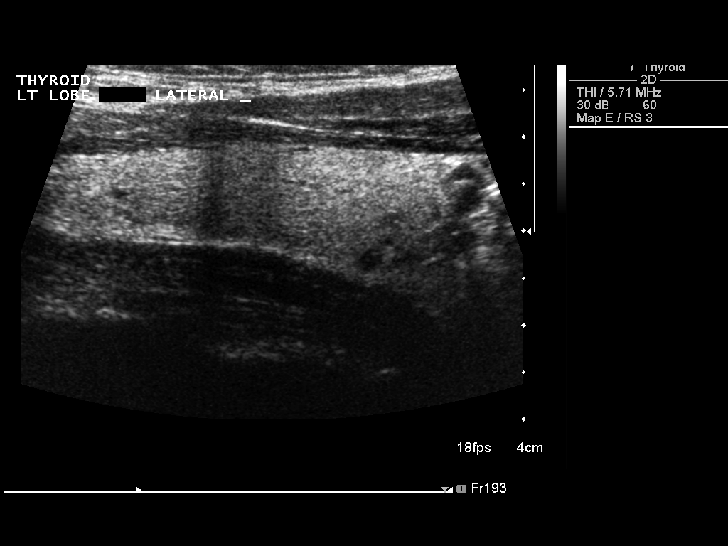
[im 27/50]
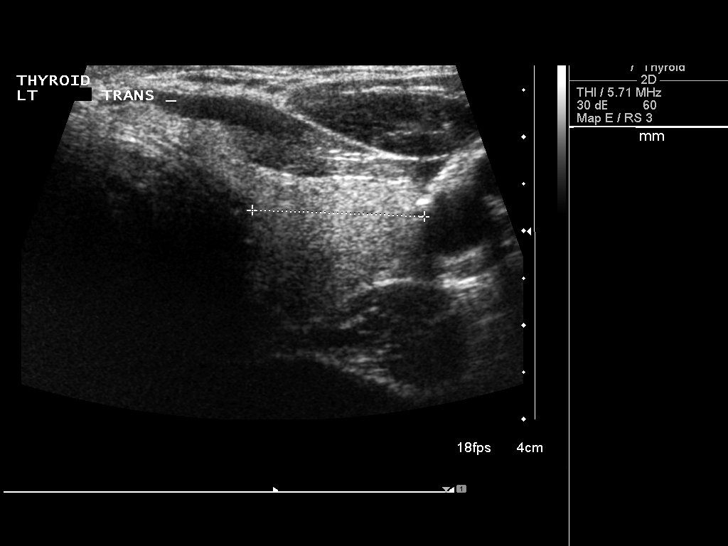
[im 31/50]
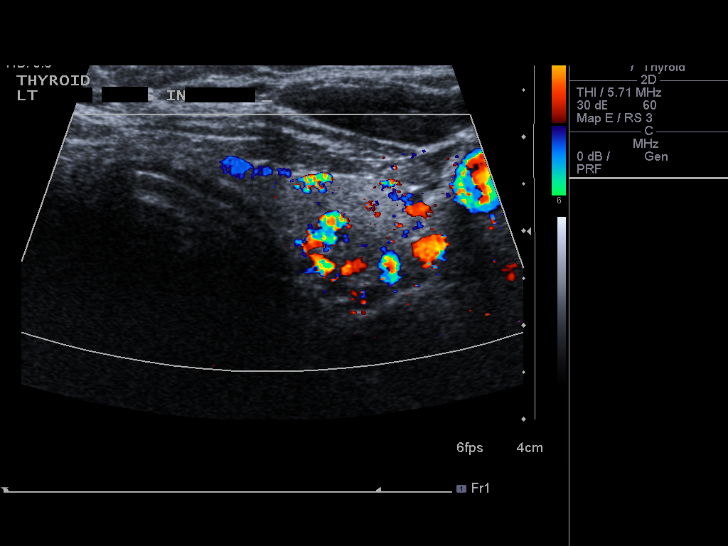
[im 33/50]
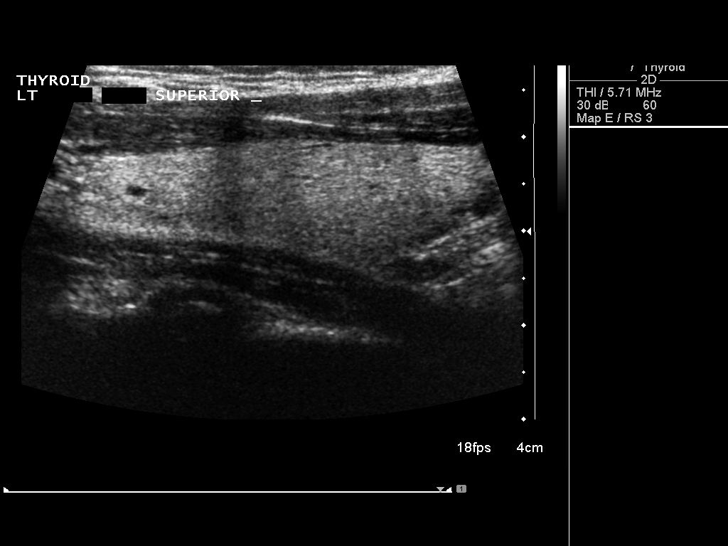
[im 37/50]
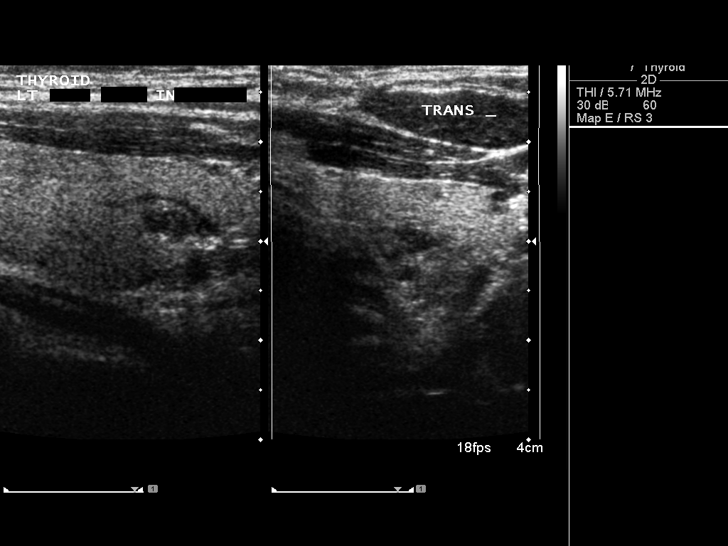
[im 41/50]
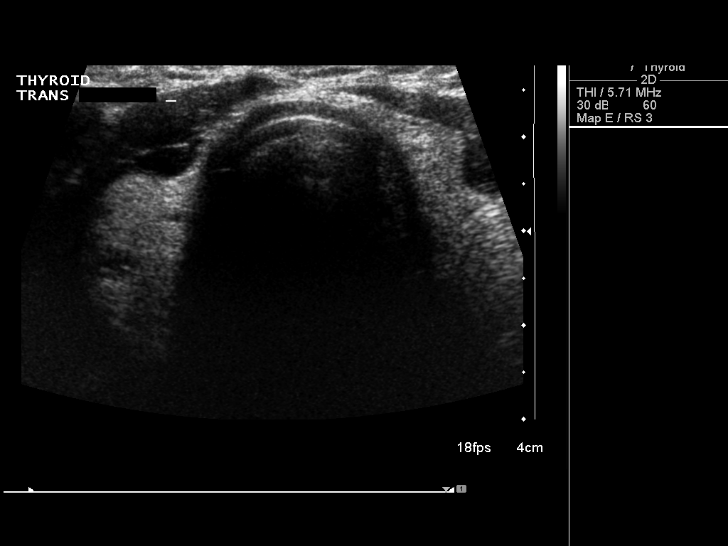
[im 45/50]
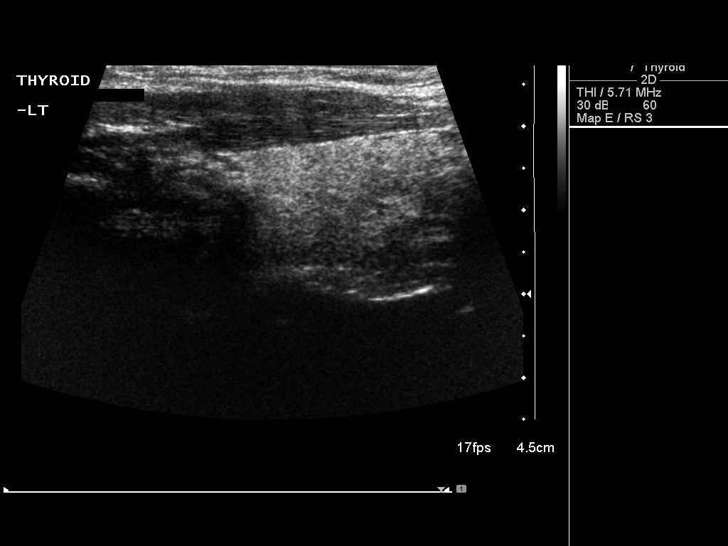
[im 50/50]
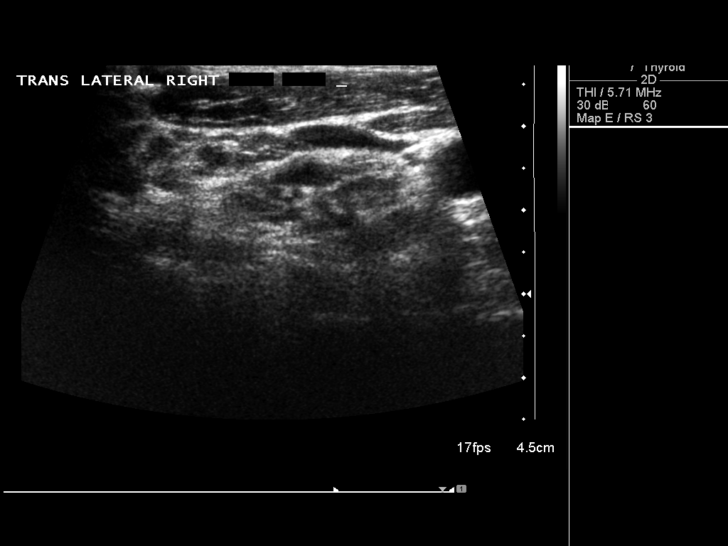

[14 of 25 positions shown; findings below may reference images not displayed]

FINDINGS: The right thyroid lobe measures 5.2 x 1.7 x 2.0 cm.  The left lobe
measures 4.8 x 1.7 x 1.8 cm.  The isthmus is no mm in thickness.
Thyroid parenchyma is relatively homogeneous throughout with
several thyroid nodules identified.

The dominant nodule is a 1.6 x 1.1 x 1.1 cm mainly solid nodule in
the right thyroid lobe which appears to have several tiny
microcalcifications.  This lesion has shown no appreciable change
in size, having measured 1.5 x 1.2 x 1.2 cm on the study from 2
years ago.  Other scattered very tiny bilateral thyroid nodules
measure in the 2-5 mm size range.
IMPRESSION: Stable appearance of the dominant thyroid nodule in the right
gland.  Other scattered tiny bilateral thyroid nodules are evident.

## 2012-12-05 DIAGNOSIS — H26019 Infantile and juvenile cortical, lamellar, or zonular cataract, unspecified eye: Secondary | ICD-10-CM | POA: Diagnosis not present

## 2012-12-14 ENCOUNTER — Other Ambulatory Visit: Payer: Self-pay | Admitting: Internal Medicine

## 2012-12-14 ENCOUNTER — Telehealth: Payer: Self-pay

## 2012-12-14 NOTE — Telephone Encounter (Signed)
[place] for nurse visit for ecg

## 2012-12-14 NOTE — Telephone Encounter (Signed)
The patient had an EKG at his last OV. The patient stated Dr. Jenny Reichmann informed him something was incorrect with the machine and results were not correct.  The patient is in the process of getting life insurance and would like to schedule an appointment to have that test done over.  Please advise as he would like to do so before christmas.  Call back number is [phone]

## 2012-12-15 ENCOUNTER — Ambulatory Visit: Payer: Medicare Other | Admitting: *Deleted

## 2012-12-15 DIAGNOSIS — Z0000 Encounter for general adult medical examination without abnormal findings: Secondary | ICD-10-CM | POA: Diagnosis not present

## 2012-12-15 NOTE — Telephone Encounter (Signed)
Patient has copy

## 2012-12-15 NOTE — Telephone Encounter (Signed)
ecg is Done hardcopy to [facility], ok to sent to patient

## 2012-12-15 NOTE — Telephone Encounter (Signed)
Patient informed. 

## 2012-12-19 ENCOUNTER — Ambulatory Visit: Payer: Medicare Other | Admitting: Internal Medicine

## 2012-12-28 ENCOUNTER — Other Ambulatory Visit: Payer: Self-pay | Admitting: Internal Medicine

## 2013-01-05 ENCOUNTER — Other Ambulatory Visit: Payer: Self-pay | Admitting: *Deleted

## 2013-01-05 MED ORDER — ROSUVASTATIN CALCIUM 20 MG PO TABS: ORAL_TABLET | ORAL | Status: DC

## 2013-01-05 NOTE — Telephone Encounter (Signed)
Pt's wife called requesting rx for Crestor be sent to [place]. Pt informed rx sent in via VM.

## 2013-01-08 ENCOUNTER — Other Ambulatory Visit: Payer: Self-pay

## 2013-01-08 MED ORDER — ROSUVASTATIN CALCIUM 20 MG PO TABS: ORAL_TABLET | ORAL | Status: DC

## 2013-03-08 DIAGNOSIS — N529 Male erectile dysfunction, unspecified: Secondary | ICD-10-CM | POA: Diagnosis not present

## 2013-04-12 ENCOUNTER — Other Ambulatory Visit: Payer: Self-pay | Admitting: Internal Medicine

## 2013-06-02 DIAGNOSIS — H2510 Age-related nuclear cataract, unspecified eye: Secondary | ICD-10-CM | POA: Diagnosis not present

## 2013-09-04 DIAGNOSIS — H524 Presbyopia: Secondary | ICD-10-CM | POA: Diagnosis not present

## 2013-09-04 DIAGNOSIS — H25019 Cortical age-related cataract, unspecified eye: Secondary | ICD-10-CM | POA: Diagnosis not present

## 2013-09-04 DIAGNOSIS — H18419 Arcus senilis, unspecified eye: Secondary | ICD-10-CM | POA: Diagnosis not present

## 2013-09-04 DIAGNOSIS — H2510 Age-related nuclear cataract, unspecified eye: Secondary | ICD-10-CM | POA: Diagnosis not present

## 2013-09-26 ENCOUNTER — Ambulatory Visit (INDEPENDENT_AMBULATORY_CARE_PROVIDER_SITE_OTHER): Payer: Medicare Other

## 2013-09-26 ENCOUNTER — Encounter: Payer: Self-pay | Admitting: Internal Medicine

## 2013-09-26 ENCOUNTER — Ambulatory Visit (INDEPENDENT_AMBULATORY_CARE_PROVIDER_SITE_OTHER): Payer: 59 | Admitting: Internal Medicine

## 2013-09-26 VITALS — BP 122/80 | HR 64 | Temp 98.0°F | Ht 72.0 in | Wt 214.8 lb

## 2013-09-26 DIAGNOSIS — M79604 Pain in right leg: Secondary | ICD-10-CM | POA: Insufficient documentation

## 2013-09-26 DIAGNOSIS — E291 Testicular hypofunction: Secondary | ICD-10-CM

## 2013-09-26 DIAGNOSIS — Z0000 Encounter for general adult medical examination without abnormal findings: Secondary | ICD-10-CM

## 2013-09-26 DIAGNOSIS — Z23 Encounter for immunization: Secondary | ICD-10-CM

## 2013-09-26 DIAGNOSIS — IMO0001 Reserved for inherently not codable concepts without codable children: Secondary | ICD-10-CM | POA: Diagnosis not present

## 2013-09-26 DIAGNOSIS — M79609 Pain in unspecified limb: Secondary | ICD-10-CM

## 2013-09-26 LAB — URINALYSIS, ROUTINE W REFLEX MICROSCOPIC
Hgb urine dipstick: NEGATIVE
Leukocytes, UA: NEGATIVE
Nitrite: NEGATIVE
Urobilinogen, UA: 0.2 (ref 0.0–1.0)

## 2013-09-26 LAB — CBC WITH DIFFERENTIAL/PLATELET
Basophils Absolute: 0 10*3/uL (ref 0.0–0.1)
Basophils Relative: 0.5 % (ref 0.0–3.0)
HCT: 47.2 % (ref 39.0–52.0)
Lymphs Abs: 2.7 10*3/uL (ref 0.7–4.0)
MCHC: 32.8 g/dL (ref 30.0–36.0)
MCV: 84.6 fl (ref 78.0–100.0)
Monocytes Absolute: 0.6 10*3/uL (ref 0.1–1.0)
Platelets: 265 10*3/uL (ref 150.0–400.0)
RDW: 15 % — ABNORMAL HIGH (ref 11.5–14.6)

## 2013-09-26 LAB — MICROALBUMIN / CREATININE URINE RATIO: Microalb Creat Ratio: 0.7 mg/g (ref 0.0–30.0)

## 2013-09-26 MED ORDER — METFORMIN HCL ER 500 MG PO TB24: 500.0000 mg | ORAL_TABLET | Freq: Every day | ORAL | Status: DC

## 2013-09-26 MED ORDER — TADALAFIL 5 MG PO TABS: 5.0000 mg | ORAL_TABLET | Freq: Every day | ORAL | Status: DC | PRN

## 2013-09-26 MED ORDER — ACYCLOVIR 800 MG PO TABS: 800.0000 mg | ORAL_TABLET | Freq: Every day | ORAL | Status: DC

## 2013-09-26 MED ORDER — SILDENAFIL CITRATE 100 MG PO TABS: 100.0000 mg | ORAL_TABLET | Freq: Every day | ORAL | Status: DC | PRN

## 2013-09-26 MED ORDER — ROSUVASTATIN CALCIUM 20 MG PO TABS: ORAL_TABLET | ORAL | Status: DC

## 2013-09-26 NOTE — Patient Instructions (Addendum)
You had the flu shot today, and Prevnar pneumonia shot You will be contacted regarding the referral for: colonscopy Please continue all other medications as before, and refills have been done if requested. Please have the pharmacy call with any other refills you may need. Please continue your efforts at being more active, low cholesterol diet, and weight control. You are otherwise up to date with prevention measures today. Please go to the LAB in the Basement (turn left off the elevator) for the tests to be done today You will be contacted by phone if any changes need to be made immediately.  Otherwise, you will receive a letter about your results with an explanation, but please check with MyChart first.  Please remember to sign up for My Chart if you have not done so, as this will be important to you in the future with finding out test results, communicating by private email, and scheduling acute appointments online when needed.  Please return in 1 year for your yearly visit, or sooner if needed

## 2013-09-26 NOTE — Assessment & Plan Note (Signed)
Also for testostetone level, The current medical regimen is effective;  continue present plan and medications.  The androderm

## 2013-09-26 NOTE — Assessment & Plan Note (Signed)
Off metfromin x 2 mo, for aqc today

## 2013-09-26 NOTE — Assessment & Plan Note (Signed)

## 2013-09-26 NOTE — Progress Notes (Addendum)
Subjective:    Patient ID: Xavier Esparza, male    DOB: 01-04-1946, 67 y.o.   MRN: [id number]  HPI  Here for wellness and f/u;  Overall doing ok;  Pt denies CP, worsening SOB, DOE, wheezing, orthopnea, PND, worsening LE edema, palpitations, dizziness or syncope.  Pt denies neurological change such as new headache, facial or extremity weakness.  Pt denies polydipsia, polyuria, or low sugar symptoms. Pt states overall good compliance with treatment and medications, good tolerability, and has been trying to follow lower cholesterol diet.  Pt denies worsening depressive symptoms, suicidal ideation or panic. No fever, night sweats, wt loss, loss of appetite, or other constitutional symptoms.  Pt states good ability with ADL's, has low fall risk, home safety reviewed and adequate, no other significant changes in hearing or vision, and only occasionally active with exercise.  Has taken himself off the metformin for 2 mo, asks to check a1c, to see if he really needs it.  A1c for 6 yrs has been normal.  Due for flu shot.  Due for testosterone check and labs.  C/o RLE calf pain mild intermittent , no longer runs but tries to walk 3 days /wk, ? Worse to walk, better to rest, but also has hx of Right LBP with sciatica, and right leg seems now shorter than left, and right calf sometimes worse with long periods of driving.  Has not yet tried shoe lift.  Also with a know to left lateral mid leg, new, small, no change in size, nontender.    Past Medical History  Diagnosis Date  . Dyslipidemia   . Hyperglycemia   . Herpes labialis     Upper Lip  . Male hypogonadism   . Thyroid nodule     Right  . Hyperlipidemia   . Diabetes mellitus   . Erectile dysfunction    Past Surgical History  Procedure Laterality Date  . No past surgeries    . Inguinal hernia repair  01/11/2012    Procedure: HERNIA REPAIR INGUINAL ADULT;  Surgeon: Pedro Earls, MD;  Location: [place];  Service: General;  Laterality:  Right;  Open right inguinal hernia repair with mesh    reports that he quit smoking about 22 years ago. He has never used smokeless tobacco. He reports that  drinks alcohol. He reports that he does not use illicit drugs. family history is not on file. He was adopted. No Known Allergies Current Outpatient Prescriptions on File Prior to Visit  Medication Sig Dispense Refill  . aspirin 81 MG tablet Take 81 mg by mouth daily.        . hydrocortisone (PROCTOSOL HC) 2.5 % rectal cream Place rectally 2 (two) times daily. As needed       . Multiple Vitamin (MULTIVITAMIN) tablet Take 1 tablet by mouth daily.        . sildenafil (VIAGRA) 100 MG tablet Take 100 mg by mouth daily as needed.        . tadalafil (CIALIS) 5 MG tablet Take 5 mg by mouth daily as needed.      . Testosterone (ANDROGEL PUMP) 1.25 GM/ACT GEL Place onto the skin. 4 pumps daily        No current facility-administered medications on file prior to visit.   Review of Systems Constitutional: Negative for diaphoresis, activity change, appetite change or unexpected weight change.  HENT: Negative for hearing loss, ear pain, facial swelling, mouth sores and neck stiffness.   Eyes: Negative for pain,  redness and visual disturbance.  Respiratory: Negative for shortness of breath and wheezing.   Cardiovascular: Negative for chest pain and palpitations.  Gastrointestinal: Negative for diarrhea, blood in stool, abdominal distention or other pain Genitourinary: Negative for hematuria, flank pain or change in urine volume.  Musculoskeletal: Negative for myalgias and joint swelling.  Skin: Negative for color change and wound.  Neurological: Negative for syncope and numbness. other than noted Hematological: Negative for adenopathy.  Psychiatric/Behavioral: Negative for hallucinations, self-injury, decreased concentration and agitation.      Objective:   Physical Exam BP 122/80  Pulse 64  Temp(Src) 98 F (36.7 C) (Oral)  Ht 6' (1.829 m)   Wt 214 lb 12 oz (97.41 kg)  BMI 29.12 kg/m2  SpO2 94% VS noted,  Constitutional: Pt is oriented to person, place, and time. Appears well-developed and well-nourished.  Head: Normocephalic and atraumatic.  Right Ear: External ear normal.  Left Ear: External ear normal.  Nose: Nose normal.  Mouth/Throat: Oropharynx is clear and moist.  Eyes: Conjunctivae and EOM are normal. Pupils are equal, round, and reactive to light.  Neck: Normal range of motion. Neck supple. No JVD present. No tracheal deviation present.  Cardiovascular: Normal rate, regular rhythm, normal heart sounds and intact distal pulses.   Pulmonary/Chest: Effort normal and breath sounds normal.  Abdominal: Soft. Bowel sounds are normal. There is no tenderness. No HSM  Musculoskeletal: Normal range of motion. Exhibits no edema.  Lymphadenopathy:  Has no cervical adenopathy.  Neurological: Pt is alert and oriented to person, place, and time. Pt has normal reflexes. No cranial nerve deficit.  Skin: Skin is warm and dry. No rash noted. 1/2cm subq nodule noted, benign, left lateral thigh Psychiatric:  Has  normal mood and affect. Behavior is normal.     Assessment & Plan:  Quality Measures addressed:  Colorectal Cancer screening: pt declines all at this time, including FOBT, flex sig, colonoscopy Diabetes LDL < 100: pt declines further medication Diabetes Hgba1c < 8%: pt declines further medication Diabetes Tobacco non-use: pt declines to quit smoking

## 2013-09-26 NOTE — Addendum Note (Signed)
Addended by: Sharon Seller B on: 09/26/2013 11:06 AM   Modules accepted: Orders

## 2013-09-26 NOTE — Assessment & Plan Note (Signed)
To start with right shoe lift, f/u persistent symptoms

## 2013-09-27 LAB — TSH: TSH: 0.64 u[IU]/mL (ref 0.35–5.50)

## 2013-09-27 LAB — BASIC METABOLIC PANEL
BUN: 14 mg/dL (ref 6–23)
CO2: 29 mEq/L (ref 19–32)
Chloride: 102 mEq/L (ref 96–112)
Creatinine, Ser: 1.4 mg/dL (ref 0.4–1.5)
Potassium: 4.8 mEq/L (ref 3.5–5.1)
Sodium: 137 mEq/L (ref 135–145)

## 2013-09-27 LAB — LIPID PANEL
Cholesterol: 161 mg/dL (ref 0–200)
Total CHOL/HDL Ratio: 3

## 2013-09-27 LAB — HEPATIC FUNCTION PANEL
AST: 26 U/L (ref 0–37)
Alkaline Phosphatase: 41 U/L (ref 39–117)
Bilirubin, Direct: 0.2 mg/dL (ref 0.0–0.3)
Total Bilirubin: 1.3 mg/dL — ABNORMAL HIGH (ref 0.3–1.2)

## 2013-09-28 ENCOUNTER — Encounter: Payer: Self-pay | Admitting: Internal Medicine

## 2013-09-28 LAB — TESTOSTERONE: Testosterone: 197.15 ng/dL — ABNORMAL LOW (ref 350.00–890.00)

## 2013-11-26 ENCOUNTER — Encounter: Payer: Self-pay | Admitting: Internal Medicine

## 2013-12-12 DIAGNOSIS — N529 Male erectile dysfunction, unspecified: Secondary | ICD-10-CM | POA: Diagnosis not present

## 2013-12-12 DIAGNOSIS — N401 Enlarged prostate with lower urinary tract symptoms: Secondary | ICD-10-CM | POA: Diagnosis not present

## 2013-12-12 DIAGNOSIS — E291 Testicular hypofunction: Secondary | ICD-10-CM | POA: Diagnosis not present

## 2014-06-05 DIAGNOSIS — N138 Other obstructive and reflux uropathy: Secondary | ICD-10-CM | POA: Diagnosis not present

## 2014-06-05 DIAGNOSIS — Z79899 Other long term (current) drug therapy: Secondary | ICD-10-CM | POA: Diagnosis not present

## 2014-06-05 DIAGNOSIS — E291 Testicular hypofunction: Secondary | ICD-10-CM | POA: Diagnosis not present

## 2014-06-05 DIAGNOSIS — N401 Enlarged prostate with lower urinary tract symptoms: Secondary | ICD-10-CM | POA: Diagnosis not present

## 2014-06-11 DIAGNOSIS — N529 Male erectile dysfunction, unspecified: Secondary | ICD-10-CM | POA: Diagnosis not present

## 2014-06-11 DIAGNOSIS — N401 Enlarged prostate with lower urinary tract symptoms: Secondary | ICD-10-CM | POA: Diagnosis not present

## 2014-06-11 DIAGNOSIS — E291 Testicular hypofunction: Secondary | ICD-10-CM | POA: Diagnosis not present

## 2014-08-07 DIAGNOSIS — H52209 Unspecified astigmatism, unspecified eye: Secondary | ICD-10-CM | POA: Diagnosis not present

## 2014-08-07 DIAGNOSIS — H25019 Cortical age-related cataract, unspecified eye: Secondary | ICD-10-CM | POA: Diagnosis not present

## 2014-09-05 DIAGNOSIS — H2510 Age-related nuclear cataract, unspecified eye: Secondary | ICD-10-CM | POA: Diagnosis not present

## 2014-09-05 DIAGNOSIS — H25039 Anterior subcapsular polar age-related cataract, unspecified eye: Secondary | ICD-10-CM | POA: Diagnosis not present

## 2014-09-05 DIAGNOSIS — H2589 Other age-related cataract: Secondary | ICD-10-CM | POA: Diagnosis not present

## 2014-09-05 DIAGNOSIS — H25049 Posterior subcapsular polar age-related cataract, unspecified eye: Secondary | ICD-10-CM | POA: Diagnosis not present

## 2014-09-13 DIAGNOSIS — L259 Unspecified contact dermatitis, unspecified cause: Secondary | ICD-10-CM | POA: Diagnosis not present

## 2014-10-01 ENCOUNTER — Encounter: Payer: Medicare Other | Admitting: Internal Medicine

## 2014-11-08 ENCOUNTER — Ambulatory Visit (INDEPENDENT_AMBULATORY_CARE_PROVIDER_SITE_OTHER): Payer: Medicare Other | Admitting: Internal Medicine

## 2014-11-08 ENCOUNTER — Encounter: Payer: Self-pay | Admitting: Internal Medicine

## 2014-11-08 ENCOUNTER — Other Ambulatory Visit (INDEPENDENT_AMBULATORY_CARE_PROVIDER_SITE_OTHER): Payer: Medicare Other

## 2014-11-08 VITALS — BP 120/82 | HR 69 | Temp 98.7°F | Ht 72.0 in | Wt 209.2 lb

## 2014-11-08 DIAGNOSIS — N529 Male erectile dysfunction, unspecified: Secondary | ICD-10-CM

## 2014-11-08 DIAGNOSIS — E291 Testicular hypofunction: Secondary | ICD-10-CM

## 2014-11-08 DIAGNOSIS — Z0000 Encounter for general adult medical examination without abnormal findings: Secondary | ICD-10-CM | POA: Diagnosis not present

## 2014-11-08 DIAGNOSIS — E119 Type 2 diabetes mellitus without complications: Secondary | ICD-10-CM

## 2014-11-08 DIAGNOSIS — Z23 Encounter for immunization: Secondary | ICD-10-CM | POA: Diagnosis not present

## 2014-11-08 DIAGNOSIS — E7800 Pure hypercholesterolemia, unspecified: Secondary | ICD-10-CM

## 2014-11-08 DIAGNOSIS — N528 Other male erectile dysfunction: Secondary | ICD-10-CM | POA: Diagnosis not present

## 2014-11-08 DIAGNOSIS — E1165 Type 2 diabetes mellitus with hyperglycemia: Secondary | ICD-10-CM | POA: Diagnosis not present

## 2014-11-08 DIAGNOSIS — N320 Bladder-neck obstruction: Secondary | ICD-10-CM

## 2014-11-08 DIAGNOSIS — E785 Hyperlipidemia, unspecified: Secondary | ICD-10-CM

## 2014-11-08 LAB — HEMOGLOBIN A1C: HEMOGLOBIN A1C: 6.2 % (ref 4.6–6.5)

## 2014-11-08 LAB — LIPID PANEL
CHOL/HDL RATIO: 3
Cholesterol: 124 mg/dL (ref 0–200)
HDL: 37.3 mg/dL — ABNORMAL LOW (ref >39.00)
LDL Cholesterol: 68 mg/dL (ref 0–99)
NONHDL: 86.7
Triglycerides: 95 mg/dL (ref 0.0–149.0)
VLDL: 19 mg/dL (ref 0.0–40.0)

## 2014-11-08 LAB — URINALYSIS, ROUTINE W REFLEX MICROSCOPIC
BILIRUBIN URINE: NEGATIVE
Ketones, ur: NEGATIVE
Leukocytes, UA: NEGATIVE
NITRITE: NEGATIVE
Specific Gravity, Urine: 1.02 (ref 1.000–1.030)
TOTAL PROTEIN, URINE-UPE24: NEGATIVE
UROBILINOGEN UA: 0.2 (ref 0.0–1.0)
Urine Glucose: NEGATIVE
pH: 6 (ref 5.0–8.0)

## 2014-11-08 LAB — HEPATIC FUNCTION PANEL
ALK PHOS: 50 U/L (ref 39–117)
ALT: 19 U/L (ref 0–53)
AST: 21 U/L (ref 0–37)
Albumin: 3.5 g/dL (ref 3.5–5.2)
BILIRUBIN DIRECT: 0.1 mg/dL (ref 0.0–0.3)
BILIRUBIN TOTAL: 0.7 mg/dL (ref 0.2–1.2)
TOTAL PROTEIN: 7.1 g/dL (ref 6.0–8.3)

## 2014-11-08 LAB — CBC WITH DIFFERENTIAL/PLATELET
BASOS ABS: 0.1 10*3/uL (ref 0.0–0.1)
BASOS PCT: 0.8 % (ref 0.0–3.0)
Eosinophils Absolute: 0.1 10*3/uL (ref 0.0–0.7)
Eosinophils Relative: 0.8 % (ref 0.0–5.0)
HCT: 47 % (ref 39.0–52.0)
Hemoglobin: 14.9 g/dL (ref 13.0–17.0)
LYMPHS PCT: 40.6 % (ref 12.0–46.0)
Lymphs Abs: 2.8 10*3/uL (ref 0.7–4.0)
MCHC: 31.8 g/dL (ref 30.0–36.0)
MCV: 84.9 fl (ref 78.0–100.0)
Monocytes Absolute: 0.5 10*3/uL (ref 0.1–1.0)
Monocytes Relative: 7.6 % (ref 3.0–12.0)
NEUTROS PCT: 50.2 % (ref 43.0–77.0)
Neutro Abs: 3.5 10*3/uL (ref 1.4–7.7)
PLATELETS: 287 10*3/uL (ref 150.0–400.0)
RBC: 5.54 Mil/uL (ref 4.22–5.81)
RDW: 15.9 % — AB (ref 11.5–15.5)
WBC: 6.9 10*3/uL (ref 4.0–10.5)

## 2014-11-08 LAB — BASIC METABOLIC PANEL
BUN: 13 mg/dL (ref 6–23)
CHLORIDE: 105 meq/L (ref 96–112)
CO2: 22 mEq/L (ref 19–32)
Calcium: 9.6 mg/dL (ref 8.4–10.5)
Creatinine, Ser: 1.2 mg/dL (ref 0.4–1.5)
GFR: 80.95 mL/min (ref >60.00)
Glucose, Bld: 101 mg/dL — ABNORMAL HIGH (ref 70–99)
POTASSIUM: 4.6 meq/L (ref 3.5–5.1)
Sodium: 141 mEq/L (ref 135–145)

## 2014-11-08 LAB — PSA: PSA: 3.19 ng/mL (ref 0.10–4.00)

## 2014-11-08 LAB — TESTOSTERONE: Testosterone: 220.43 ng/dL — ABNORMAL LOW (ref 300.00–890.00)

## 2014-11-08 LAB — TSH: TSH: 0.8 u[IU]/mL (ref 0.35–4.50)

## 2014-11-08 LAB — MICROALBUMIN / CREATININE URINE RATIO
CREATININE, U: 201.2 mg/dL
MICROALB UR: 2.2 mg/dL — AB (ref 0.0–1.9)
MICROALB/CREAT RATIO: 1.1 mg/g (ref 0.0–30.0)

## 2014-11-08 MED ORDER — TESTOSTERONE 12.5 MG/ACT (1%) TD GEL: TRANSDERMAL | Status: DC

## 2014-11-08 MED ORDER — METFORMIN HCL 500 MG PO TABS: 500.0000 mg | ORAL_TABLET | Freq: Every day | ORAL | Status: DC

## 2014-11-08 MED ORDER — ACYCLOVIR 800 MG PO TABS
800.0000 mg | ORAL_TABLET | Freq: Every day | ORAL | Status: DC
Start: 2014-11-08 — End: 2016-01-07

## 2014-11-08 MED ORDER — ROSUVASTATIN CALCIUM 20 MG PO TABS: ORAL_TABLET | ORAL | Status: DC

## 2014-11-08 NOTE — Patient Instructions (Addendum)
You had the flu shot today  Please continue all other medications as before, and refills have been done if requested.  Please have the pharmacy call with any other refills you may need.  Please continue your efforts at being more active, low cholesterol diet, and weight control.  You are otherwise up to date with prevention measures today.  Please keep your appointments with your specialists as you may have planned  You will be contacted regarding the referral for: colonoscopy after Dec 27, 2014  Please go to the LAB in the Basement (turn left off the elevator) for the tests to be done today  You will be contacted by phone if any changes need to be made immediately.  Otherwise, you will receive a letter about your results with an explanation, but please check with MyChart first.  Please remember to sign up for MyChart if you have not done so, as this will be important to you in the future with finding out test results, communicating by private email, and scheduling acute appointments online when needed.  Please return in 1 year for your yearly visit, or sooner if needed

## 2014-11-08 NOTE — Assessment & Plan Note (Addendum)
ECG reviewed as per emr, stable overall by history and exam, recent data reviewed with pt, and pt to continue medical treatment as before,  to f/u any worsening symptoms or concerns Lab Results  Component Value Date   LDLCALC 68 11/08/2014

## 2014-11-08 NOTE — Progress Notes (Signed)
Pre visit review using our clinic review tool, if applicable. No additional management support is needed unless otherwise documented below in the visit note. 

## 2014-11-08 NOTE — Progress Notes (Signed)
Subjective:    Patient ID: Xavier Esparza, male    DOB: Dec 02, 1946, 68 y.o.   MRN: [id number]  HPI  Here for yearly f/u;  Overall doing ok;  Pt denies CP, worsening SOB, DOE, wheezing, orthopnea, PND, worsening LE edema, palpitations, dizziness or syncope.  Pt denies neurological change such as new headache, facial or extremity weakness.  Pt denies polydipsia, polyuria, or low sugar symptoms. Pt states overall good compliance with treatment and medications, good tolerability, and has been trying to follow lower cholesterol diet.  Pt denies worsening depressive symptoms, suicidal ideation or panic. No fever, night sweats, wt loss, loss of appetite, or other constitutional symptoms.  Pt states good ability with ADL's, has low fall risk, home safety reviewed and adequate, no other significant changes in hearing or vision, and only occasionally active with exercise.  No current complaints.  Only taking 3 pumps of his testost prep instead of 4. Asks about Hep C testing bc he saw a billboard advertisemnt, but has no risk factors Past Medical History  Diagnosis Date  . Dyslipidemia   . Hyperglycemia   . Herpes labialis     Upper Lip  . Male hypogonadism   . Thyroid nodule     Right  . Hyperlipidemia   . Diabetes mellitus   . Erectile dysfunction    Past Surgical History  Procedure Laterality Date  . No past surgeries    . Inguinal hernia repair  01/11/2012    Procedure: HERNIA REPAIR INGUINAL ADULT;  Surgeon: Pedro Earls, MD;  Location: [place];  Service: General;  Laterality: Right;  Open right inguinal hernia repair with mesh    reports that he quit smoking about 23 years ago. He has never used smokeless tobacco. He reports that he drinks alcohol. He reports that he does not use illicit drugs. family history is not on file. He was adopted. No Known Allergies Current Outpatient Prescriptions on File Prior to Visit  Medication Sig Dispense Refill  . acyclovir (ZOVIRAX) 800  MG tablet Take 1 tablet (800 mg total) by mouth daily. 90 tablet 3  . aspirin 81 MG tablet Take 81 mg by mouth daily.      . hydrocortisone (PROCTOSOL HC) 2.5 % rectal cream Place rectally 2 (two) times daily. As needed     . Multiple Vitamin (MULTIVITAMIN) tablet Take 1 tablet by mouth daily.      . rosuvastatin (CRESTOR) 20 MG tablet TAKE ONE TABLET BY MOUTH EVERY DAY 90 tablet 3  . sildenafil (VIAGRA) 100 MG tablet Take 1 tablet (100 mg total) by mouth daily as needed. 10 tablet 11  . tadalafil (CIALIS) 5 MG tablet Take 1 tablet (5 mg total) by mouth daily as needed. 30 tablet 11  . Testosterone (ANDROGEL PUMP) 1.25 GM/ACT GEL Place onto the skin. 4 pumps daily      No current facility-administered medications on file prior to visit.   Review of Systems Constitutional: Negative for increased diaphoresis, other activity, appetite or other siginficant weight change  HENT: Negative for worsening hearing loss, ear pain, facial swelling, mouth sores and neck stiffness.   Eyes: Negative for other worsening pain, redness or visual disturbance.  Respiratory: Negative for shortness of breath and wheezing.   Cardiovascular: Negative for chest pain and palpitations.  Gastrointestinal: Negative for diarrhea, blood in stool, abdominal distention or other pain Genitourinary: Negative for hematuria, flank pain or change in urine volume.  Musculoskeletal: Negative for myalgias or other joint  complaints.  Skin: Negative for color change and wound.  Neurological: Negative for syncope and numbness. other than noted Hematological: Negative for adenopathy. or other swelling Psychiatric/Behavioral: Negative for hallucinations, self-injury, decreased concentration or other worsening agitation.      Objective:   Physical Exam BP 120/82 mmHg  Pulse 69  Temp(Src) 98.7 F (37.1 C) (Oral)  Ht 6' (1.829 m)  Wt 209 lb 4 oz (94.915 kg)  BMI 28.37 kg/m2  SpO2 94% VS noted,  Constitutional: Pt is oriented to  person, place, and time. Appears well-developed and well-nourished.  Head: Normocephalic and atraumatic.  Right Ear: External ear normal.  Left Ear: External ear normal.  Nose: Nose normal.  Mouth/Throat: Oropharynx is clear and moist.  Eyes: Conjunctivae and EOM are normal. Pupils are equal, round, and reactive to light.  Neck: Normal range of motion. Neck supple. No JVD present. No tracheal deviation present.  Cardiovascular: Normal rate, regular rhythm, normal heart sounds and intact distal pulses.   Pulmonary/Chest: Effort normal and breath sounds without rales or wheezing  Abdominal: Soft. Bowel sounds are normal. NT. No HSM  Musculoskeletal: Normal range of motion. Exhibits no edema.  Lymphadenopathy:  Has no cervical adenopathy.  Neurological: Pt is alert and oriented to person, place, and time. Pt has normal reflexes. No cranial nerve deficit. Motor grossly intact Skin: Skin is warm and dry. No rash noted.  Psychiatric:  Has normal mood and affect. Behavior is normal.  Wt Readings from Last 3 Encounters:  11/08/14 209 lb 4 oz (94.915 kg)  09/26/13 214 lb 12 oz (97.41 kg)  09/21/12 212 lb (96.163 kg)       Assessment & Plan:

## 2014-11-09 DIAGNOSIS — E291 Testicular hypofunction: Secondary | ICD-10-CM | POA: Insufficient documentation

## 2014-11-09 NOTE — Assessment & Plan Note (Signed)
Ok for refill cialis/viagra per pt request, stable overall by history and exam, and pt to continue medical treatment as before,  to f/u any worsening symptoms or concerns

## 2014-11-09 NOTE — Assessment & Plan Note (Signed)
Total testost low , ok to incr pumps to 4 qd,  to f/u any worsening symptoms or concerns

## 2014-11-09 NOTE — Assessment & Plan Note (Signed)
Also for psa as he is due 

## 2014-11-09 NOTE — Assessment & Plan Note (Signed)
stable overall by history and exam, recent data reviewed with pt, and pt to continue medical treatment as before,  to f/u any worsening symptoms or concerns Lab Results  Component Value Date   HGBA1C 6.2 11/08/2014

## 2014-11-12 ENCOUNTER — Other Ambulatory Visit: Payer: Self-pay | Admitting: Internal Medicine

## 2014-11-14 ENCOUNTER — Telehealth: Payer: Self-pay

## 2014-11-14 MED ORDER — METFORMIN HCL ER 500 MG PO TB24: 500.0000 mg | ORAL_TABLET | Freq: Every day | ORAL | Status: DC

## 2014-11-14 NOTE — Telephone Encounter (Signed)
Fax from [facility] stating they received recent refill for Metformin 500 mg Immediate release, patient has a previous ongoing history of Extended Release.  Please verify the form to dispense.

## 2014-11-14 NOTE — Telephone Encounter (Signed)
rx was corrected and re-sent erx

## 2014-12-03 ENCOUNTER — Encounter: Payer: Self-pay | Admitting: Internal Medicine

## 2014-12-06 DIAGNOSIS — E291 Testicular hypofunction: Secondary | ICD-10-CM | POA: Diagnosis not present

## 2014-12-06 DIAGNOSIS — N5201 Erectile dysfunction due to arterial insufficiency: Secondary | ICD-10-CM | POA: Diagnosis not present

## 2014-12-06 DIAGNOSIS — N138 Other obstructive and reflux uropathy: Secondary | ICD-10-CM | POA: Diagnosis not present

## 2014-12-06 DIAGNOSIS — N401 Enlarged prostate with lower urinary tract symptoms: Secondary | ICD-10-CM | POA: Diagnosis not present

## 2014-12-06 DIAGNOSIS — Z79899 Other long term (current) drug therapy: Secondary | ICD-10-CM | POA: Diagnosis not present

## 2014-12-27 HISTORY — PX: EYE SURGERY: SHX253

## 2015-10-17 ENCOUNTER — Telehealth: Payer: Self-pay

## 2015-10-17 NOTE — Telephone Encounter (Signed)
Patient called to educate on Medicare Wellness apt. LVM for the patient to call back to educate and schedule for wellness visit. Last exam was in Nov 2015

## 2015-10-20 NOTE — Telephone Encounter (Signed)
Call to the patient on cell; Very busy but educated on AWV and agreed to come in. Difficult to schedule CPE with DR Jenny Reichmann. Deferred to schedulers but the AWV is on 11/9 at 930

## 2015-11-05 ENCOUNTER — Ambulatory Visit: Payer: Medicare Other

## 2015-11-11 ENCOUNTER — Ambulatory Visit: Payer: Medicare Other

## 2015-11-12 ENCOUNTER — Ambulatory Visit: Payer: Medicare Other

## 2015-11-14 ENCOUNTER — Other Ambulatory Visit: Payer: Self-pay | Admitting: Internal Medicine

## 2015-11-18 ENCOUNTER — Encounter: Payer: Medicare Other | Admitting: Internal Medicine

## 2015-11-26 ENCOUNTER — Telehealth: Payer: Self-pay | Admitting: Internal Medicine

## 2015-11-26 NOTE — Telephone Encounter (Signed)
Pt states that he D/C's DM medications over 2 years ago because he was not aware he was diabetic. Pt states that although Dr Loanne Drilling Dx him several years ago he was never told about diagnosis and since medication was las refilled by Dr Jenny Reichmann he wanted to follow up with PCP. Pt has CPE scheduled 12/2015 but states that this it too long so he was transferred to make an acute appt with another provider for Aic and follow up

## 2015-11-26 NOTE — Telephone Encounter (Signed)
I'm confused b/c he is being treated for diabetes with metformin, which I thought he understood  His overall a1c's have been excellent on this treatment  OK to let pt know he does have DM, and to cont all meds

## 2015-11-26 NOTE — Telephone Encounter (Signed)
Pt called in and has questions why he has in his chart that he as a history of diabetes.  His ins is denying him coverage because that is in his history and he says that he knows nothing about that   Best number [phone]

## 2015-11-27 ENCOUNTER — Ambulatory Visit (INDEPENDENT_AMBULATORY_CARE_PROVIDER_SITE_OTHER): Payer: Medicare Other | Admitting: Internal Medicine

## 2015-11-27 ENCOUNTER — Encounter: Payer: Self-pay | Admitting: Internal Medicine

## 2015-11-27 DIAGNOSIS — E291 Testicular hypofunction: Secondary | ICD-10-CM | POA: Diagnosis not present

## 2015-11-27 DIAGNOSIS — E7800 Pure hypercholesterolemia, unspecified: Secondary | ICD-10-CM | POA: Diagnosis not present

## 2015-11-27 DIAGNOSIS — E119 Type 2 diabetes mellitus without complications: Secondary | ICD-10-CM

## 2015-11-27 DIAGNOSIS — N320 Bladder-neck obstruction: Secondary | ICD-10-CM | POA: Diagnosis not present

## 2015-11-27 NOTE — Progress Notes (Signed)
Pre visit review using our clinic review tool, if applicable. No additional management support is needed unless otherwise documented below in the visit note. 

## 2015-11-27 NOTE — Progress Notes (Signed)
Subjective:    Patient ID: Xavier Esparza, male    DOB: 10-23-1946, 69 y.o.   MRN: [id number]  HPI  Here after he got a declination letter from insurance for long term care insurance since he has dx of DM.  In fact pt has stopped his metformin about 6 mo ago and crestor as well as he just though he wanted to know how bad it might get with lifestyle changes only and better diet. Pt denies chest pain, increased sob or doe, wheezing, orthopnea, PND, increased LE swelling, palpitations, dizziness or syncope.  Pt denies new neurological symptoms such as new headache, or facial or extremity weakness or numbness   Pt denies polydipsia, polyuria.  Asks for labs today, has appt for cpx next month.   Past Medical History  Diagnosis Date  . Dyslipidemia   . Hyperglycemia   . Herpes labialis     Upper Lip  . Male hypogonadism   . Thyroid nodule     Right  . Hyperlipidemia   . Diabetes mellitus   . Erectile dysfunction    Past Surgical History  Procedure Laterality Date  . No past surgeries    . Inguinal hernia repair  01/11/2012    Procedure: HERNIA REPAIR INGUINAL ADULT;  Surgeon: Pedro Earls, MD;  Location: [place];  Service: General;  Laterality: Right;  Open right inguinal hernia repair with mesh    reports that he quit smoking about 24 years ago. He has never used smokeless tobacco. He reports that he drinks alcohol. He reports that he does not use illicit drugs. family history is not on file. He was adopted. No Known Allergies Current Outpatient Prescriptions on File Prior to Visit  Medication Sig Dispense Refill  . aspirin 81 MG tablet Take 81 mg by mouth daily.      . Multiple Vitamin (MULTIVITAMIN) tablet Take 1 tablet by mouth daily.      . sildenafil (VIAGRA) 100 MG tablet Take 1 tablet (100 mg total) by mouth daily as needed. 10 tablet 11  . tadalafil (CIALIS) 5 MG tablet Take 1 tablet (5 mg total) by mouth daily as needed. 30 tablet 11  . acyclovir (ZOVIRAX)  800 MG tablet Take 1 tablet (800 mg total) by mouth daily. (Patient not taking: Reported on 11/27/2015) 90 tablet 3  . acyclovir (ZOVIRAX) 800 MG tablet TAKE ONE TABLET BY MOUTH ONCE DAILY (Patient not taking: Reported on 11/27/2015) 90 tablet 0  . metFORMIN (GLUCOPHAGE-XR) 500 MG 24 hr tablet Take 1 tablet (500 mg total) by mouth daily with breakfast. (Patient not taking: Reported on 11/27/2015) 90 tablet 3  . rosuvastatin (CRESTOR) 20 MG tablet TAKE ONE TABLET BY MOUTH EVERY DAY (Patient not taking: Reported on 11/27/2015) 90 tablet 3  . Testosterone (ANDROGEL PUMP) 12.5 MG/ACT (1%) GEL 4 pumps daily (Patient not taking: Reported on 11/27/2015) 75 g 5   No current facility-administered medications on file prior to visit.   Review of Systems  Constitutional: Negative for unusual diaphoresis or night sweats HENT: Negative for ringing in ear or discharge Eyes: Negative for double vision or worsening visual disturbance.  Respiratory: Negative for choking and stridor.   Gastrointestinal: Negative for vomiting or other signifcant bowel change Genitourinary: Negative for hematuria or change in urine volume.  Musculoskeletal: Negative for other MSK pain or swelling Skin: Negative for color change and worsening wound.  Neurological: Negative for tremors and numbness other than noted  Psychiatric/Behavioral: Negative for decreased concentration  or agitation other than above       Objective:   Physical Exam There were no vitals taken for this visit. VS noted,  Constitutional: Pt appears in no significant distress HENT: Head: NCAT.  Right Ear: External ear normal.  Left Ear: External ear normal.  Eyes: . Pupils are equal, round, and reactive to light. Conjunctivae and EOM are normal Neck: Normal range of motion. Neck supple.  Cardiovascular: Normal rate and regular rhythm.   Pulmonary/Chest: Effort normal and breath sounds without rales or wheezing.  Abd:  Soft, NT, ND, + BS Neurological: Pt is  alert. Not confused , motor grossly intact Skin: Skin is warm. No rash, no LE edema Psychiatric: Pt behavior is normal. No agitation.     Assessment & Plan:

## 2015-11-27 NOTE — Patient Instructions (Addendum)
Please continue all other medications as you are doing now  Please have the pharmacy call with any other refills you may need.  Please continue your efforts at being more active, low cholesterol diet, and weight control.  Please keep your appointments with your specialists as you may have planned  Please go to the LAB in the Basement (turn left off the elevator) for the tests to be done at your convenience  You will be contacted by phone if any changes need to be made immediately.  Otherwise, you will receive a letter about your results with an explanation, but please check with MyChart first.  Please remember to sign up for MyChart if you have not done so, as this will be important to you in the future with finding out test results, communicating by private email, and scheduling acute appointments online when needed.  We'll see you at your next appointment next month

## 2015-11-28 ENCOUNTER — Encounter: Payer: Self-pay | Admitting: Internal Medicine

## 2015-11-28 ENCOUNTER — Other Ambulatory Visit (INDEPENDENT_AMBULATORY_CARE_PROVIDER_SITE_OTHER): Payer: Medicare Other

## 2015-11-28 ENCOUNTER — Ambulatory Visit (INDEPENDENT_AMBULATORY_CARE_PROVIDER_SITE_OTHER): Payer: Medicare Other

## 2015-11-28 DIAGNOSIS — N320 Bladder-neck obstruction: Secondary | ICD-10-CM

## 2015-11-28 DIAGNOSIS — E291 Testicular hypofunction: Secondary | ICD-10-CM

## 2015-11-28 DIAGNOSIS — Z23 Encounter for immunization: Secondary | ICD-10-CM

## 2015-11-28 DIAGNOSIS — E7800 Pure hypercholesterolemia, unspecified: Secondary | ICD-10-CM | POA: Diagnosis not present

## 2015-11-28 DIAGNOSIS — E119 Type 2 diabetes mellitus without complications: Secondary | ICD-10-CM

## 2015-11-28 DIAGNOSIS — R7989 Other specified abnormal findings of blood chemistry: Secondary | ICD-10-CM

## 2015-11-28 LAB — MICROALBUMIN / CREATININE URINE RATIO
CREATININE, U: 229.2 mg/dL
Microalb Creat Ratio: 0.3 mg/g (ref 0.0–30.0)
Microalb, Ur: 0.8 mg/dL (ref 0.0–1.9)

## 2015-11-28 LAB — TESTOSTERONE: TESTOSTERONE: 214.44 ng/dL — AB (ref 300.00–890.00)

## 2015-11-28 LAB — HEMOGLOBIN A1C: Hgb A1c MFr Bld: 6.1 % (ref 4.6–6.5)

## 2015-11-28 LAB — URINALYSIS, ROUTINE W REFLEX MICROSCOPIC
BILIRUBIN URINE: NEGATIVE
KETONES UR: NEGATIVE
Leukocytes, UA: NEGATIVE
Nitrite: NEGATIVE
PH: 7 (ref 5.0–8.0)
RBC / HPF: NONE SEEN (ref 0–?)
SPECIFIC GRAVITY, URINE: 1.015 (ref 1.000–1.030)
TOTAL PROTEIN, URINE-UPE24: NEGATIVE
UROBILINOGEN UA: 0.2 (ref 0.0–1.0)
Urine Glucose: NEGATIVE
WBC UA: NONE SEEN (ref 0–?)

## 2015-11-28 LAB — LIPID PANEL
CHOL/HDL RATIO: 6
CHOLESTEROL: 272 mg/dL — AB (ref 0–200)
HDL: 45.8 mg/dL (ref >39.00)
NonHDL: 225.73
TRIGLYCERIDES: 346 mg/dL — AB (ref 0.0–149.0)
VLDL: 69.2 mg/dL — AB (ref 0.0–40.0)

## 2015-11-28 LAB — CBC WITH DIFFERENTIAL/PLATELET
BASOS ABS: 0 10*3/uL (ref 0.0–0.1)
BASOS PCT: 0.5 % (ref 0.0–3.0)
EOS ABS: 0.1 10*3/uL (ref 0.0–0.7)
Eosinophils Relative: 1.4 % (ref 0.0–5.0)
HCT: 45 % (ref 39.0–52.0)
Hemoglobin: 14.6 g/dL (ref 13.0–17.0)
LYMPHS ABS: 2.6 10*3/uL (ref 0.7–4.0)
LYMPHS PCT: 41 % (ref 12.0–46.0)
MCHC: 32.4 g/dL (ref 30.0–36.0)
MCV: 84.9 fl (ref 78.0–100.0)
MONO ABS: 0.5 10*3/uL (ref 0.1–1.0)
Monocytes Relative: 8.1 % (ref 3.0–12.0)
NEUTROS ABS: 3.1 10*3/uL (ref 1.4–7.7)
NEUTROS PCT: 49 % (ref 43.0–77.0)
PLATELETS: 288 10*3/uL (ref 150.0–400.0)
RBC: 5.3 Mil/uL (ref 4.22–5.81)
RDW: 15.6 % — AB (ref 11.5–15.5)
WBC: 6.3 10*3/uL (ref 4.0–10.5)

## 2015-11-28 LAB — HEPATIC FUNCTION PANEL
ALBUMIN: 3.9 g/dL (ref 3.5–5.2)
ALT: 16 U/L (ref 0–53)
AST: 20 U/L (ref 0–37)
Alkaline Phosphatase: 50 U/L (ref 39–117)
BILIRUBIN TOTAL: 0.6 mg/dL (ref 0.2–1.2)
Bilirubin, Direct: 0.1 mg/dL (ref 0.0–0.3)
TOTAL PROTEIN: 7.1 g/dL (ref 6.0–8.3)

## 2015-11-28 LAB — PSA: PSA: 3.55 ng/mL (ref 0.10–4.00)

## 2015-11-28 LAB — BASIC METABOLIC PANEL
BUN: 14 mg/dL (ref 6–23)
CHLORIDE: 102 meq/L (ref 96–112)
CO2: 32 mEq/L (ref 19–32)
CREATININE: 1.27 mg/dL (ref 0.40–1.50)
Calcium: 9.4 mg/dL (ref 8.4–10.5)
GFR: 71.97 mL/min (ref >60.00)
Glucose, Bld: 104 mg/dL — ABNORMAL HIGH (ref 70–99)
Potassium: 4.5 mEq/L (ref 3.5–5.1)
Sodium: 139 mEq/L (ref 135–145)

## 2015-11-28 LAB — LDL CHOLESTEROL, DIRECT: Direct LDL: 143 mg/dL

## 2015-11-28 LAB — TSH: TSH: 0.91 u[IU]/mL (ref 0.35–4.50)

## 2015-11-28 NOTE — Assessment & Plan Note (Signed)
stable overall by history and exam, recent data reviewed with pt, and pt to continue medical treatment as before,  to f/u any worsening symptoms or concerns Lab Results  Component Value Date   LDLCALC 68 11/08/2014

## 2015-11-28 NOTE — Assessment & Plan Note (Signed)
Also for psa as he is due 

## 2015-11-28 NOTE — Assessment & Plan Note (Signed)
Asympt, for testosterone level as well

## 2015-11-28 NOTE — Assessment & Plan Note (Signed)
stable overall by history and exam, recent data reviewed with pt, and pt to continue medical treatment as before,  to f/u any worsening symptoms or concerns Lab Results  Component Value Date   HGBA1C 6.1 11/28/2015   Pt not taking metformin, cont tx as is,  to f/u any worsening symptoms or concerns

## 2015-12-02 ENCOUNTER — Telehealth: Payer: Self-pay | Admitting: Internal Medicine

## 2015-12-02 NOTE — Telephone Encounter (Signed)
Patient would like a call back with lab results and would like them to be mailed.

## 2015-12-03 NOTE — Telephone Encounter (Signed)
I can write a letter stating patient has diet controlled diabetes, as this is the only thing that I can support from the chart records  Please let me know if this is ok with the pt

## 2015-12-03 NOTE — Telephone Encounter (Signed)
Pt is requesting a letter:   Pt is trying to get life insurance and is requesting a letter that states he is not diabetic. His A1c is down without taking metformin. Life insurance company denied coverage because they believe he is diabetic.

## 2015-12-16 NOTE — Telephone Encounter (Signed)
Pt agrees to this letter

## 2015-12-17 ENCOUNTER — Encounter: Payer: Self-pay | Admitting: Internal Medicine

## 2015-12-17 NOTE — Telephone Encounter (Signed)
Done hardcopy to Dahlia  

## 2015-12-17 NOTE — Telephone Encounter (Signed)
Letter mailed

## 2015-12-28 HISTORY — PX: CATARACT EXTRACTION, BILATERAL: SHX1313

## 2016-01-06 ENCOUNTER — Ambulatory Visit: Payer: Medicare Other

## 2016-01-07 ENCOUNTER — Ambulatory Visit (INDEPENDENT_AMBULATORY_CARE_PROVIDER_SITE_OTHER): Payer: Medicare Other | Admitting: Internal Medicine

## 2016-01-07 ENCOUNTER — Encounter: Payer: Self-pay | Admitting: Internal Medicine

## 2016-01-07 VITALS — BP 124/82 | HR 63 | Temp 98.6°F | Ht 72.0 in | Wt 212.0 lb

## 2016-01-07 DIAGNOSIS — E7800 Pure hypercholesterolemia, unspecified: Secondary | ICD-10-CM | POA: Diagnosis not present

## 2016-01-07 DIAGNOSIS — N400 Enlarged prostate without lower urinary tract symptoms: Secondary | ICD-10-CM | POA: Diagnosis not present

## 2016-01-07 DIAGNOSIS — R972 Elevated prostate specific antigen [PSA]: Secondary | ICD-10-CM | POA: Insufficient documentation

## 2016-01-07 DIAGNOSIS — Z0000 Encounter for general adult medical examination without abnormal findings: Secondary | ICD-10-CM

## 2016-01-07 DIAGNOSIS — E119 Type 2 diabetes mellitus without complications: Secondary | ICD-10-CM | POA: Diagnosis not present

## 2016-01-07 DIAGNOSIS — E291 Testicular hypofunction: Secondary | ICD-10-CM | POA: Diagnosis not present

## 2016-01-07 MED ORDER — SILDENAFIL CITRATE 20 MG PO TABS: ORAL_TABLET | ORAL | Status: DC

## 2016-01-07 NOTE — Assessment & Plan Note (Addendum)

## 2016-01-07 NOTE — Addendum Note (Signed)
Addended by: Lyman Bishop on: 01/07/2016 09:43 AM   Modules accepted: Orders

## 2016-01-07 NOTE — Assessment & Plan Note (Signed)
Ok for f/u psa in 6 mo,  to f/u any worsening symptoms or concerns

## 2016-01-07 NOTE — Assessment & Plan Note (Signed)
To hold on further testosteron with increased psa velocity

## 2016-01-07 NOTE — Assessment & Plan Note (Signed)
Pt had taken holiday off the statin to see if diet would suffice, will restart crestor  Lab Results  Component Value Date   LDLCALC 68 11/08/2014

## 2016-01-07 NOTE — Progress Notes (Signed)
Subjective:    Patient ID: Xavier Esparza, male    DOB: 1946-05-19, 70 y.o.   MRN: [id number]  HPI  Here for wellness and f/u;  Overall doing ok;  Pt denies Chest pain, worsening SOB, DOE, wheezing, orthopnea, PND, worsening LE edema, palpitations, dizziness or syncope.  Pt denies neurological change such as new headache, facial or extremity weakness.  Pt denies polydipsia, polyuria, or low sugar symptoms. Pt states overall good compliance with treatment and medications, good tolerability, and has been trying to follow appropriate diet.  Pt denies worsening depressive symptoms, suicidal ideation or panic. No fever, night sweats, wt loss, loss of appetite, or other constitutional symptoms.  Pt states good ability with ADL's, has low fall risk, home safety reviewed and adequate, no other significant changes in hearing or vision, and occasionally active with exercise. Wt Readings from Last 3 Encounters:  01/07/16 212 lb (96.163 kg)  11/08/14 209 lb 4 oz (94.915 kg)  09/26/13 214 lb 12 oz (97.41 kg)  Asks for f/u on right thyroid nodule but not now, will ask for f/u US at some point, declines now.  Also taking revatio 20 qd for prostate per urology Past Medical History  Diagnosis Date  . Dyslipidemia   . Hyperglycemia   . Herpes labialis     Upper Lip  . Male hypogonadism   . Thyroid nodule     Right  . Hyperlipidemia   . Diabetes mellitus   . Erectile dysfunction    Past Surgical History  Procedure Laterality Date  . No past surgeries    . Inguinal hernia repair  01/11/2012    Procedure: HERNIA REPAIR INGUINAL ADULT;  Surgeon: Pedro Earls, MD;  Location: [facility];  Service: General;  Laterality: Right;  Open right inguinal hernia repair with mesh    reports that he quit smoking about 25 years ago. He has never used smokeless tobacco. He reports that he drinks alcohol. He reports that he does not use illicit drugs. family history is not on file. He was adopted. No  Known Allergies Current Outpatient Prescriptions on File Prior to Visit  Medication Sig Dispense Refill  . aspirin 81 MG tablet Take 81 mg by mouth daily.      . sildenafil (VIAGRA) 100 MG tablet Take 1 tablet (100 mg total) by mouth daily as needed. 10 tablet 11  . acyclovir (ZOVIRAX) 800 MG tablet Take 1 tablet (800 mg total) by mouth daily. (Patient not taking: Reported on 11/27/2015) 90 tablet 3  . acyclovir (ZOVIRAX) 800 MG tablet TAKE ONE TABLET BY MOUTH ONCE DAILY (Patient not taking: Reported on 11/27/2015) 90 tablet 0  . Multiple Vitamin (MULTIVITAMIN) tablet Take 1 tablet by mouth daily. Reported on 01/07/2016    . tadalafil (CIALIS) 5 MG tablet Take 1 tablet (5 mg total) by mouth daily as needed. (Patient not taking: Reported on 01/07/2016) 30 tablet 11   No current facility-administered medications on file prior to visit.     Review of Systems  Constitutional: Negative for increased diaphoresis, other activity, appetite or siginficant weight change other than noted HENT: Negative for worsening hearing loss, ear pain, facial swelling, mouth sores and neck stiffness.   Eyes: Negative for other worsening pain, redness or visual disturbance.  Respiratory: Negative for shortness of breath and wheezing  Cardiovascular: Negative for chest pain and palpitations.  Gastrointestinal: Negative for diarrhea, blood in stool, abdominal distention or other pain Genitourinary: Negative for hematuria, flank pain or change  in urine volume.  Musculoskeletal: Negative for myalgias or other joint complaints.  Skin: Negative for color change and wound or drainage.  Neurological: Negative for syncope and numbness. other than noted Hematological: Negative for adenopathy. or other swelling Psychiatric/Behavioral: Negative for hallucinations, SI, self-injury, decreased concentration or other worsening agitation.       Objective:   Physical Exam BP 124/82 mmHg  Pulse 63  Temp(Src) 98.6 F (37 C)  (Oral)  Ht 6' (1.829 m)  Wt 212 lb (96.163 kg)  BMI 28.75 kg/m2  SpO2 98% VS noted,  Constitutional: Pt is oriented to person, place, and time. Appears well-developed and well-nourished, in no significant distress Head: Normocephalic and atraumatic.  Right Ear: External ear normal.  Left Ear: External ear normal.  Nose: Nose normal.  Mouth/Throat: Oropharynx is clear and moist.  Right thyroid fullness noted without specific discrete able to be palpated Eyes: Conjunctivae and EOM are normal. Pupils are equal, round, and reactive to light.  Neck: Normal range of motion. Neck supple. No JVD present. No tracheal deviation present or significant neck LA or mass Cardiovascular: Normal rate, regular rhythm, normal heart sounds and intact distal pulses.   Pulmonary/Chest: Effort normal and breath sounds without rales or wheezing  Abdominal: Soft. Bowel sounds are normal. NT. No HSM  Musculoskeletal: Normal range of motion. Exhibits no edema.  Lymphadenopathy:  Has no cervical adenopathy.  Neurological: Pt is alert and oriented to person, place, and time. Pt has normal reflexes. No cranial nerve deficit. Motor grossly intact Skin: Skin is warm and dry. No rash noted.  Psychiatric:  Has normal mood and affect. Behavior is normal.      Assessment & Plan:

## 2016-01-07 NOTE — Patient Instructions (Addendum)
Please continue all other medications as before, and refills have been done if requested.  Please have the pharmacy call with any other refills you may need.  Please continue your efforts at being more active, low cholesterol diet, and weight control.  You are otherwise up to date with prevention measures today.  Please keep your appointments with your specialists as you may have planned  You will be contacted regarding the referral for: colonoscopy  Please return in 6 months, or sooner if needed, with Lab testing done 3-5 days before  

## 2016-01-07 NOTE — Progress Notes (Signed)
Pre visit review using our clinic review tool, if applicable. No additional management support is needed unless otherwise documented below in the visit note. 

## 2016-01-07 NOTE — Assessment & Plan Note (Signed)
To cont the revatio qd

## 2016-01-07 NOTE — Assessment & Plan Note (Signed)
stable overall by history and exam, recent data reviewed with pt, and pt to continue medical treatment as before,  to f/u any worsening symptoms or concerns Lab Results  Component Value Date   HGBA1C 6.1 11/28/2015   To cont the metformin

## 2016-01-13 ENCOUNTER — Ambulatory Visit: Payer: Medicare Other

## 2016-02-09 DIAGNOSIS — E291 Testicular hypofunction: Secondary | ICD-10-CM | POA: Diagnosis not present

## 2016-02-09 DIAGNOSIS — N401 Enlarged prostate with lower urinary tract symptoms: Secondary | ICD-10-CM | POA: Diagnosis not present

## 2016-02-09 DIAGNOSIS — Z79899 Other long term (current) drug therapy: Secondary | ICD-10-CM | POA: Diagnosis not present

## 2016-03-02 ENCOUNTER — Other Ambulatory Visit: Payer: Self-pay | Admitting: Internal Medicine

## 2016-03-08 ENCOUNTER — Other Ambulatory Visit: Payer: Self-pay | Admitting: Internal Medicine

## 2016-04-15 ENCOUNTER — Encounter: Payer: Self-pay | Admitting: *Deleted

## 2016-04-15 ENCOUNTER — Other Ambulatory Visit: Payer: Medicare Other

## 2016-04-15 NOTE — Patient Instructions (Signed)
  Your procedure is scheduled on: 04-20-16 (TUESDAY) Report to [facility] To find out your arrival time please call [phone] between 1PM - 3PM on 04-19-16 (MONDAY)  Remember: Instructions that are not followed completely may result in serious medical risk, up to and including death, or upon the discretion of your surgeon and anesthesiologist your surgery may need to be rescheduled.    _X___ 1. Do not eat food or drink liquids after midnight. No gum chewing or hard candies.     _X___ 2. No Alcohol for 24 hours before or after surgery.   ____ 3. Bring all medications with you on the day of surgery if instructed.    _X___ 4. Notify your doctor if there is any change in your medical condition     (cold, fever, infections).     Do not wear jewelry, make-up, hairpins, clips or nail polish.  Do not wear lotions, powders, or perfumes. You may wear deodorant.  Do not shave 48 hours prior to surgery. Men may shave face and neck.  Do not bring valuables to the hospital.    [facility] is not responsible for any belongings or valuables.               Contacts, dentures or bridgework may not be worn into surgery.  Leave your suitcase in the car. After surgery it may be brought to your room.  For patients admitted to the hospital, discharge time is determined by your treatment team.   Patients discharged the day of surgery will not be allowed to drive home.   Please read over the following fact sheets that you were given:     _X__ Take these medicines the morning of surgery with A SIP OF WATER:    1. ROSUVASTATIN  2.   3.   4.  5.  6.  ____ Fleet Enema (as directed)   ____ Use CHG Soap as directed  ____ Use inhalers on the day of surgery  _X__ Stop metformin 2 days prior to surgery -LAST DOSE ON Saturday 04-17-16  ____ Take 1/2 of usual insulin dose the night before surgery and none on the morning of surgery.   ____ Stop Coumadin/Plavix/aspirin-PT  STOPPED ASPIRIN ON 04-13-16  ____ Stop Anti-inflammatories-NO NSAIDS OR ASPIRIN PRODUCTS-TYLENOL OK TO TAKE   _X__ Stop supplements until after surgery-STOP GLUCOSAMINE AND FISH OIL NOW  ____ Bring C-Pap to the hospital.

## 2016-04-15 NOTE — H&P (Signed)
NAME:  COLT, ZYSK                       ACCOUNT NO.:  MEDICAL RECORD NO.:  [id number]  LOCATION:                                 FACILITY:  PHYSICIAN:  Maryan Puls          DATE OF BIRTH:  [birth year]  DATE OF ADMISSION: DATE OF DISCHARGE:                            HISTORY AND PHYSICAL   CHIEF COMPLAINT:  Difficulty voiding.  HISTORY OF PRESENT ILLNESS:  Mr. Mcclammy is a 70 year old, Afro-American male, with a long history of BPH and lower urinary tract symptoms.  He was also found to have an elevated PSA of 4.5 ng/mL and subsequently underwent ultrasound-guided needle biopsy.  Ultrasound indicated an 83.9 g prostate with trilobar BPH and obstruction.  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  The patient comes in now for photovaporization of prostate with a GreenLight laser.  PAST MEDICAL HISTORY:  No drug allergies.  CURRENT MEDICATION:  Metformin, aspirin, Crestor, calcium, multivitamin, acyclovir, fish oil, and sildenafil.  PAST SURGICAL HISTORY:  Cataract removal in June, 2016.  SOCIAL HISTORY:  The patient quit smoking in 1995 with a 25 pack year history.  He consumes wine on social occasions.  FAMILY HISTORY:  Unknown as he is adopted.  PAST AND CURRENT MEDICAL CONDITIONS: 1. Hypercholesteremia. 2. Erectile dysfunction. 3. Hypogonadism.  REVIEW OF SYSTEMS:  The patient denies chest pain, shortness of breath, heart disease, or stroke.  PHYSICAL EXAM:  GENERAL:  A well-nourished African American male, in no distress. HEENT:  Sclerae were clear.  Pupils were equally round, reactive to light and accommodation.  Extraocular moves are intact. NECK:  Supple.  No palpable cervical adenopathy. LUNGS:  Clear to auscultation. CARDIOVASCULAR:  Regular rhythm and rate without audible murmurs. ABDOMEN:  Soft and nontender abdomen. GU:  Circumcised testes, smooth and nontender. RECTAL:  Greater than 50 g  smooth and nontender prostate. NEUROMUSCULAR:  Alert and oriented x3.  IMPRESSION: 1. Benign prostatic hyperplasia with bladder outlet obstruction. 2. Focal Gleason's grade 3 +3, adenocarcinoma prostate. 3. Elevated prostate-specific antigen.  PLAN:  Photovaporization of the prostate with GreenLight laser.          ______________________________ Maryan Puls     MW/MEDQ  D:  04/14/2016  T:  04/14/2016  Job:  [id number]

## 2016-04-16 ENCOUNTER — Encounter
Admission: RE | Admit: 2016-04-16 | Discharge: 2016-04-16 | Disposition: A | Discharge status: Home / Self Care | Payer: Medicare Other | Source: Ambulatory Visit | Attending: Physical Medicine and Rehabilitation | Admitting: Physical Medicine and Rehabilitation

## 2016-04-16 DIAGNOSIS — Z01810 Encounter for preprocedural cardiovascular examination: Secondary | ICD-10-CM | POA: Insufficient documentation

## 2016-04-16 DIAGNOSIS — Z01812 Encounter for preprocedural laboratory examination: Secondary | ICD-10-CM | POA: Diagnosis present

## 2016-04-16 LAB — BASIC METABOLIC PANEL
Anion gap: 2 — ABNORMAL LOW (ref 5–15)
BUN: 17 mg/dL (ref 6–20)
CO2: 30 mmol/L (ref 22–32)
CREATININE: 1.3 mg/dL — AB (ref 0.61–1.24)
Calcium: 9.3 mg/dL (ref 8.9–10.3)
Chloride: 107 mmol/L (ref 101–111)
GFR calc Af Amer: >60 mL/min (ref >60)
GFR, EST NON AFRICAN AMERICAN: 54 mL/min — AB (ref >60)
GLUCOSE: 97 mg/dL (ref 65–99)
Potassium: 4.2 mmol/L (ref 3.5–5.1)
SODIUM: 139 mmol/L (ref 135–145)

## 2016-04-16 NOTE — Pre-Procedure Instructions (Signed)
Sent chart over to Dr Marcello Moores per his request to review EKG.  EKG showed no inferior infarct. Ok to proceed per Dr Marcello Moores

## 2016-04-20 ENCOUNTER — Encounter: Payer: Self-pay | Admitting: *Deleted

## 2016-04-20 ENCOUNTER — Ambulatory Visit: Payer: Medicare Other | Admitting: Anesthesiology

## 2016-04-20 ENCOUNTER — Encounter
Admission: RE | Disposition: A | Discharge status: Home / Self Care | Payer: Self-pay | Source: Ambulatory Visit | Attending: Urology

## 2016-04-20 ENCOUNTER — Ambulatory Visit
Admission: RE | Admit: 2016-04-20 | Discharge: 2016-04-20 | Disposition: A | Discharge status: Home / Self Care | Payer: Medicare Other | Source: Ambulatory Visit | Attending: Urology | Admitting: Urology

## 2016-04-20 DIAGNOSIS — Z7984 Long term (current) use of oral hypoglycemic drugs: Secondary | ICD-10-CM | POA: Insufficient documentation

## 2016-04-20 DIAGNOSIS — Z87891 Personal history of nicotine dependence: Secondary | ICD-10-CM | POA: Insufficient documentation

## 2016-04-20 DIAGNOSIS — Z79899 Other long term (current) drug therapy: Secondary | ICD-10-CM | POA: Diagnosis not present

## 2016-04-20 DIAGNOSIS — E7800 Pure hypercholesterolemia, unspecified: Secondary | ICD-10-CM | POA: Insufficient documentation

## 2016-04-20 DIAGNOSIS — N138 Other obstructive and reflux uropathy: Secondary | ICD-10-CM

## 2016-04-20 DIAGNOSIS — E041 Nontoxic single thyroid nodule: Secondary | ICD-10-CM | POA: Insufficient documentation

## 2016-04-20 DIAGNOSIS — N320 Bladder-neck obstruction: Secondary | ICD-10-CM | POA: Insufficient documentation

## 2016-04-20 DIAGNOSIS — R972 Elevated prostate specific antigen [PSA]: Secondary | ICD-10-CM | POA: Insufficient documentation

## 2016-04-20 DIAGNOSIS — C61 Malignant neoplasm of prostate: Secondary | ICD-10-CM | POA: Insufficient documentation

## 2016-04-20 DIAGNOSIS — E785 Hyperlipidemia, unspecified: Secondary | ICD-10-CM | POA: Insufficient documentation

## 2016-04-20 DIAGNOSIS — N529 Male erectile dysfunction, unspecified: Secondary | ICD-10-CM | POA: Insufficient documentation

## 2016-04-20 DIAGNOSIS — E119 Type 2 diabetes mellitus without complications: Secondary | ICD-10-CM | POA: Insufficient documentation

## 2016-04-20 DIAGNOSIS — N401 Enlarged prostate with lower urinary tract symptoms: Secondary | ICD-10-CM | POA: Insufficient documentation

## 2016-04-20 DIAGNOSIS — Z87442 Personal history of urinary calculi: Secondary | ICD-10-CM | POA: Insufficient documentation

## 2016-04-20 HISTORY — DX: Other specified health status: Z78.9

## 2016-04-20 HISTORY — PX: GREEN LIGHT LASER TURP (TRANSURETHRAL RESECTION OF PROSTATE: SHX6260

## 2016-04-20 HISTORY — DX: Encounter for adoption services: Z02.82

## 2016-04-20 LAB — GLUCOSE, CAPILLARY
GLUCOSE-CAPILLARY: 100 mg/dL — AB (ref 65–99)
Glucose-Capillary: 87 mg/dL (ref 65–99)

## 2016-04-20 SURGERY — GREEN LIGHT LASER TURP (TRANSURETHRAL RESECTION OF PROSTATE
Anesthesia: General | Wound class: Clean Contaminated

## 2016-04-20 MED ORDER — FAMOTIDINE 20 MG PO TABS
ORAL_TABLET | ORAL | Status: AC
  Administered 2016-04-20: 20 mg via ORAL
  Filled 2016-04-20: qty 1

## 2016-04-20 MED ORDER — FENTANYL CITRATE (PF) 100 MCG/2ML IJ SOLN
INTRAMUSCULAR | Status: DC | PRN
  Administered 2016-04-20: 25 ug via INTRAVENOUS
  Administered 2016-04-20: 50 ug via INTRAVENOUS
  Administered 2016-04-20 (×2): 25 ug via INTRAVENOUS

## 2016-04-20 MED ORDER — BELLADONNA ALKALOIDS-OPIUM 16.2-60 MG RE SUPP
RECTAL | Status: DC | PRN
  Administered 2016-04-20: 1 via RECTAL

## 2016-04-20 MED ORDER — LIDOCAINE HCL 2 % EX GEL
CUTANEOUS | Status: DC | PRN
Start: 2016-04-20 — End: 2016-04-20
  Administered 2016-04-20: 1 via URETHRAL

## 2016-04-20 MED ORDER — UROGESIC-BLUE 81.6 MG PO TABS
1.0000 | ORAL_TABLET | Freq: Four times a day (QID) | ORAL | Status: DC | PRN
Start: 2016-04-20 — End: 2017-04-25

## 2016-04-20 MED ORDER — ONDANSETRON HCL 4 MG/2ML IJ SOLN: 4.0000 mg | Freq: Once | INTRAMUSCULAR | Status: DC | PRN

## 2016-04-20 MED ORDER — BELLADONNA ALKALOIDS-OPIUM 16.2-60 MG RE SUPP
RECTAL | Status: AC
  Filled 2016-04-20: qty 1

## 2016-04-20 MED ORDER — FENTANYL CITRATE (PF) 100 MCG/2ML IJ SOLN
25.0000 ug | INTRAMUSCULAR | Status: DC | PRN
  Administered 2016-04-20 (×4): 25 ug via INTRAVENOUS

## 2016-04-20 MED ORDER — LEVOFLOXACIN IN D5W 500 MG/100ML IV SOLN
500.0000 mg | Freq: Once | INTRAVENOUS | Status: AC
  Administered 2016-04-20: 500 mg via INTRAVENOUS

## 2016-04-20 MED ORDER — LIDOCAINE HCL (CARDIAC) 20 MG/ML IV SOLN
INTRAVENOUS | Status: DC | PRN
  Administered 2016-04-20: 60 mg via INTRAVENOUS

## 2016-04-20 MED ORDER — LEVOFLOXACIN IN D5W 500 MG/100ML IV SOLN
INTRAVENOUS | Status: AC
  Filled 2016-04-20: qty 100

## 2016-04-20 MED ORDER — PROPOFOL 10 MG/ML IV BOLUS
INTRAVENOUS | Status: DC | PRN
  Administered 2016-04-20: 170 mg via INTRAVENOUS

## 2016-04-20 MED ORDER — FAMOTIDINE 20 MG PO TABS
20.0000 mg | ORAL_TABLET | Freq: Once | ORAL | Status: AC
  Administered 2016-04-20: 20 mg via ORAL

## 2016-04-20 MED ORDER — SODIUM CHLORIDE 0.9 % IV SOLN
INTRAVENOUS | Status: DC
  Administered 2016-04-20 (×2): via INTRAVENOUS

## 2016-04-20 MED ORDER — NUCYNTA 50 MG PO TABS
50.0000 mg | ORAL_TABLET | Freq: Four times a day (QID) | ORAL | Status: DC | PRN
Start: 2016-04-20 — End: 2017-04-25

## 2016-04-20 MED ORDER — LEVOFLOXACIN 500 MG PO TABS: 500.0000 mg | ORAL_TABLET | Freq: Every day | ORAL | Status: DC

## 2016-04-20 MED ORDER — DOCUSATE SODIUM 100 MG PO CAPS: 200.0000 mg | ORAL_CAPSULE | Freq: Two times a day (BID) | ORAL | Status: DC

## 2016-04-20 MED ORDER — FENTANYL CITRATE (PF) 100 MCG/2ML IJ SOLN
INTRAMUSCULAR | Status: AC
  Administered 2016-04-20: 25 ug via INTRAVENOUS
  Filled 2016-04-20: qty 2

## 2016-04-20 MED ORDER — LIDOCAINE HCL 2 % EX GEL
CUTANEOUS | Status: AC
  Filled 2016-04-20: qty 10

## 2016-04-20 MED ORDER — ONDANSETRON HCL 4 MG/2ML IJ SOLN
INTRAMUSCULAR | Status: DC | PRN
  Administered 2016-04-20: 4 mg via INTRAVENOUS

## 2016-04-20 MED ORDER — MIDAZOLAM HCL 2 MG/2ML IJ SOLN
INTRAMUSCULAR | Status: DC | PRN
  Administered 2016-04-20: 2 mg via INTRAVENOUS

## 2016-04-20 SURGICAL SUPPLY — 31 items
ADAPTER IRRIG TUBE 2 SPIKE SOL (ADAPTER) ×6 IMPLANT
ADPR TBG 2 SPK PMP STRL ASCP (ADAPTER) ×2
BAG URO DRAIN 2000ML W/SPOUT (MISCELLANEOUS) ×3 IMPLANT
CATH FOLEY 2WAY  5CC 20FR SIL (CATHETERS) ×2
CATH FOLEY 2WAY 5CC 20FR SIL (CATHETERS) ×1 IMPLANT
GLOVE BIO SURGEON STRL SZ7 (GLOVE) ×6 IMPLANT
GLOVE BIO SURGEON STRL SZ7.5 (GLOVE) ×3 IMPLANT
GOWN STRL REUS W/ TWL LRG LVL3 (GOWN DISPOSABLE) ×1 IMPLANT
GOWN STRL REUS W/ TWL XL LVL3 (GOWN DISPOSABLE) ×1 IMPLANT
GOWN STRL REUS W/TWL LRG LVL3 (GOWN DISPOSABLE) ×3
GOWN STRL REUS W/TWL XL LVL3 (GOWN DISPOSABLE) ×3
IV NS 1000ML (IV SOLUTION) ×3
IV NS 1000ML BAXH (IV SOLUTION) ×1 IMPLANT
IV SET PRIMARY 15D 139IN B9900 (IV SETS) ×3 IMPLANT
KIT RM TURNOVER CYSTO AR (KITS) ×3 IMPLANT
LASER GREENLIGHT XPS PROCEDURE (MISCELLANEOUS) ×2 IMPLANT
LASER GRNLGT 950 (MISCELLANEOUS) ×1 IMPLANT
LASER GRNLGT MOXY FIBER 750UM (MISCELLANEOUS) ×3 IMPLANT
PACK CYSTO AR (MISCELLANEOUS) ×3 IMPLANT
PREP PVP WINGED SPONGE (MISCELLANEOUS) ×3 IMPLANT
SET IRRIG Y TYPE TUR BLADDER L (SET/KITS/TRAYS/PACK) ×3 IMPLANT
SOL .9 NS 3000ML IRR  AL (IV SOLUTION) ×8
SOL .9 NS 3000ML IRR AL (IV SOLUTION) ×4
SOL .9 NS 3000ML IRR UROMATIC (IV SOLUTION) ×4 IMPLANT
SOL PREP PVP 2OZ (MISCELLANEOUS) ×3
SOLUTION PREP PVP 2OZ (MISCELLANEOUS) ×1 IMPLANT
SURGILUBE 2OZ TUBE FLIPTOP (MISCELLANEOUS) ×3 IMPLANT
SYRINGE IRR TOOMEY STRL 70CC (SYRINGE) ×3 IMPLANT
TUBING CONNECTING 10 (TUBING) ×2 IMPLANT
TUBING CONNECTING 10' (TUBING) ×1
WATER STERILE IRR 1000ML POUR (IV SOLUTION) ×3 IMPLANT

## 2016-04-20 NOTE — Transfer of Care (Signed)
Immediate Anesthesia Transfer of Care Note  Patient: Xavier Esparza  Procedure(s) Performed: Procedure(s): GREEN LIGHT LASER TURP (TRANSURETHRAL RESECTION OF PROSTATE (N/A)  Patient Location: PACU  Anesthesia Type:General  Level of Consciousness: sedated  Airway & Oxygen Therapy: Patient Spontanous Breathing and Patient connected to face mask oxygen  Post-op Assessment: Report given to RN and Post -op Vital signs reviewed and stable  Post vital signs: Reviewed and stable  Last Vitals:  Filed Vitals:   04/20/16 1356 04/20/16 1702  BP: 144/92 155/100  Pulse: 59 63  Temp: 35.7 C 36.3 C  Resp: 16 14    Complications: No apparent anesthesia complications

## 2016-04-20 NOTE — Anesthesia Procedure Notes (Signed)
Procedure Name: LMA Insertion Date/Time: 04/20/2016 3:05 PM Performed by: Dionne Bucy Pre-anesthesia Checklist: Patient identified, Patient being monitored, Timeout performed, Emergency Drugs available and Suction available Patient Re-evaluated:Patient Re-evaluated prior to inductionOxygen Delivery Method: Circle system utilized Preoxygenation: Pre-oxygenation with 100% oxygen Intubation Type: IV induction Ventilation: Mask ventilation without difficulty LMA: LMA inserted LMA Size: 5.0 Tube type: Oral Number of attempts: 1 Placement Confirmation: positive ETCO2 Tube secured with: Tape Dental Injury: Teeth and Oropharynx as per pre-operative assessment

## 2016-04-20 NOTE — Discharge Instructions (Signed)
Benign Prostatic Hyperplasia An enlarged prostate (benign prostatic hyperplasia) is common in older men. You may experience the following:  Weak urine stream.  Dribbling.  Feeling like the bladder has not emptied completely.  Difficulty starting urination.  Getting up frequently at night to urinate.  Urinating more frequently during the day. HOME CARE INSTRUCTIONS  Monitor your prostatic hyperplasia for any changes. The following actions may help to alleviate any discomfort you are experiencing: 1. Give yourself time when you urinate. 2. Stay away from alcohol. 3. Avoid beverages containing caffeine, such as coffee, tea, and colas, because they can make the problem worse. 4. Avoid decongestants, antihistamines, and some prescription medicines that can make the problem worse. 5. Follow up with your health care provider for further treatment as recommended. SEEK MEDICAL CARE IF: 1. You are experiencing progressive difficulty voiding. 2. Your urine stream is progressively getting narrower. 3. You are awaking from sleep with the urge to void more frequently. 4. You are constantly feeling the need to void. 5. You experience loss of urine, especially in small amounts. SEEK IMMEDIATE MEDICAL CARE IF:  1. You develop increased pain with urination or are unable to urinate. 2. You develop severe abdominal pain, vomiting, a high fever, or fainting. 3. You develop back pain or blood in your urine. MAKE SURE YOU:  1. Understand these instructions. 2. Will watch your condition. 3. Will get help right away if you are not doing well or get worse.   This information is not intended to replace advice given to you by your health care provider. Make sure you discuss any questions you have with your health care provider.   Document Released: 12/13/2005 Document Revised: 01/03/2015 Document Reviewed: 05/15/2013 Elsevier Interactive Patient Education 2016 [place] Light Laser Prostate  Treatment Green light laser therapy is a procedure that uses a special high-energy laser for vaporizing extra prostate tissue. It is less invasive than traditional methods of prostate surgery, which involve cutting out the prostate tissue. Because the tissue is vaporized rather than cut out there is generally less blood loss. LET [facility] CARE PROVIDER KNOW ABOUT:  Any allergies you have.  Any medicines you are taking, including vitamins, herbs, eye drops, creams, and over-the-counter medication.  Previous problems you or members of your family have had with the use of anesthetics.  Any blood disorders you have.  Previous surgeries you have had.  Medical conditions you have. RISKS AND COMPLICATIONS Generally, green light laser prostate treatment is a safe procedure. However, as with any procedure, complications can occur. Possible complications include: 6. Urinary tract infection. 7. Erectile dysfunction (rare). 8. Dry ejaculation--Semen is not released when you reach sexual climax. 9. Scar tissue in the urinary passage. BEFORE THE PROCEDURE  6. Your health care provider may discuss medicines you are taking and may advise you to stop taking specific ones. 7. You may be given antibiotic medicine to take as a precaution against bacterial infection. 8. Do not eat or drink anything for 8 hours before your procedure or as directed by your health care provider. You may have a sip of water to take any necessary medicines. PROCEDURE Depending on the size and shape of your prostate, the procedure may take 30-60 minutes. You will be given one of the following:  4. A medicine that makes you go to sleep (general anesthetic). 5. A medicine injected into your spine that numbs your body below the waist (spinal anesthetic). Sedation is usually given with spinal anesthetic so  you will be relaxed. A tube containing viewing scopes and instruments will be inserted through your penis so that no cuts  (incisions) are needed. A thin fiber is put through the tube and positioned next to the excess prostate tissue. Pulses of laser light come from the end of the fiber and are projected onto the excess tissue. The laser beam is absorbed by your blood, which becomes hot enough to vaporize the excess prostate tissue. This laser beam will seal off the blood vessels, decreasing bleeding. The tube with the viewing scopes, instruments, and thin fiber will be removed and replaced with a temporary catheter. AFTER THE PROCEDURE  After the surgery, you will be sent to the recovery room for a short time. Depending on factors such as the amount of prostate tissue vaporized, the strength of your bladder, and the amount of bleeding expected, the catheter may be removed. Generally, overnight stay is not needed and you will be sent home on the same day as the procedure. You may be sent home with elastic support stockings to help prevent blood clots in your legs.    This information is not intended to replace advice given to you by your health care provider. Make sure you discuss any questions you have with your health care provider.   Document Released: 03/21/2008 Document Revised: 12/18/2013 Document Reviewed: 06/04/2013 Elsevier Interactive Patient Education 2016 [place] [facility] Prostate laser surgery is a procedure to eliminate prostate tissue. There are two types of prostate laser surgery: ablation (prostate tissue is melted away) and enucleation (prostate tissue is cut out). LET [facility] CARE PROVIDER KNOW ABOUT:  Any allergies you have.  All medicines you are taking, including vitamins, herbs, eye drops, creams, and over-the-counter medicines.  Previous problems you or members of your family have had with the use of anesthetics.  Any blood disorders you have.  Previous surgeries you have had.  Medical conditions you have. RISKS AND COMPLICATIONS  Generally prostate laser surgery  is a safe procedure. However, as with any procedure, problems can occur. Possible problems include: 10. Bleeding and the need for a blood transfusion.  11. Urinary tract infection. 12. Erectile dysfunction. 13. Narrowing (scar or stricture) of the urethra, which blocks the flow of urine. 14. Dry ejaculation (semen is not released when you reach sexual climax). BEFORE THE PROCEDURE  9. If you are on blood thinners, such as warfarin or clopidogrel, or nonprescription pain-relieving medicines, such as naproxen sodium or ibuprofen, you may be asked to stop taking them before the procedure. 10. Your health care provider may ask you to start taking antibiotic medicines before the procedure as a precaution against a bacterial infection. The procedure will not be performed if your urine is infected. 11. You should have nothing to eat or drink for at least 8 hours before your procedure, or as suggested by your health care provider. You may have a sip of water to take medications not stopped for the procedure. PROCEDURE  You will be given one of the following:  6. A medicine that numbs the area (local anesthetic). 7. A medicine injected into your spine that numbs your body below the waist (spinal anesthetic). A sedative is usually given with spinal anesthetic so you will be relaxed during the procedure. A viewing scope and instruments will be placed in a tube that is inserted through your penis, so no incisions will be needed to insert the scope and instruments. Depending on the type of laser used,  the prostate tissue will either be vaporized or cut away. The laser beam will coagulate any small bleeding areas. At the end of the surgery, a special tube will be inserted into your bladder to drain the urine from your bladder (urinary catheter). AFTER THE PROCEDURE You will be sent to the recovery room for a short time. In the recovery room, you will receive fluids through an IV tube inserted in one of your  veins. Your blood pressure and pulse will be checked frequently to make sure that they stabilize. Once you are eating and drinking fluids appropriately, the IV tube will be removed.  Depending on your specific needs, you may be admitted to the hospital or you will be sent home after the procedure. If you are sent home: 4. You may be sent home with elastic support stockings to help prevent blood clots in your legs. 5. You will also probably be given an antibiotic medicine. 6. Unless told otherwise, you may restart your other medications. 7. You may be given a stool softener.   This information is not intended to replace advice given to you by your health care provider. Make sure you discuss any questions you have with your health care provider.   Document Released: 12/13/2005 Document Revised: 12/18/2013 Document Reviewed: 06/04/2013 Elsevier Interactive Patient Education 2016 [place], Adult A Foley catheter is a soft, flexible tube that is placed into the bladder to drain urine. A Foley catheter may be inserted if:  You leak urine or are not able to control when you urinate (urinary incontinence).  You are not able to urinate when you need to (urinary retention).  You had prostate surgery or surgery on the genitals.  You have certain medical conditions, such as multiple sclerosis, dementia, or a spinal cord injury. If you are going home with a Foley catheter in place, follow the instructions below. TAKING CARE OF THE CATHETER 15. Wash your hands with soap and water. 16. Using mild soap and warm water on a clean washcloth:  Clean the area on your body closest to the catheter insertion site using a circular motion, moving away from the catheter. Never wipe toward the catheter because this could sweep bacteria up into the urethra and cause infection.  Remove all traces of soap. Pat the area dry with a clean towel. For males, reposition the foreskin. 52. Attach the  catheter to your leg so there is no tension on the catheter. Use adhesive tape or a leg strap. If you are using adhesive tape, remove any sticky residue left behind by the previous tape you used. 18. Keep the drainage bag below the level of the bladder, but keep it off the floor. 19. Check throughout the day to be sure the catheter is working and urine is draining freely. Make sure the tubing does not become kinked. 20. Do not pull on the catheter or try to remove it. Pulling could damage internal tissues. TAKING CARE OF THE DRAINAGE BAGS You will be given two drainage bags to take home. One is a large overnight drainage bag, and the other is a smaller leg bag that fits underneath clothing. You may wear the overnight bag at any time, but you should never wear the smaller leg bag at night. Follow the instructions below for how to empty, change, and clean your drainage bags. Emptying the Drainage Bag You must empty your drainage bag when it is  - full or at least 2-3 times a day. 12.  Wash your hands with soap and water. 13. Keep the drainage bag below your hips, below the level of your bladder. This stops urine from going back into the tubing and into your bladder. 14. Hold the dirty bag over the toilet or a clean container. 15. Open the pour spout at the bottom of the bag and empty the urine into the toilet or container. Do not let the pour spout touch the toilet, container, or any other surface. Doing so can place bacteria on the bag, which can cause an infection. 16. Clean the pour spout with a gauze pad or cotton ball that has rubbing alcohol on it. 17. Close the pour spout. 18. Attach the bag to your leg with adhesive tape or a leg strap. 19. Wash your hands well. Changing the Drainage Bag Change your drainage bag once a month or sooner if it starts to smell bad or look dirty. Below are steps to follow when changing the drainage bag. 8. Wash your hands with soap and water. 9. Pinch off the  rubber catheter so that urine does not spill out. 10. Disconnect the catheter tube from the drainage tube at the connection valve. Do not let the tubes touch any surface. 11. Clean the end of the catheter tube with an alcohol wipe. Use a different alcohol wipe to clean the end of the drainage tube. 12. Connect the catheter tube to the drainage tube of the clean drainage bag. 13. Attach the new bag to the leg with adhesive tape or a leg strap. Avoid attaching the new bag too tightly. 14. Wash your hands well. Cleaning the Drainage Bag 8. Wash your hands with soap and water. 9. Wash the bag in warm, soapy water. 10. Rinse the bag thoroughly with warm water. 11. Fill the bag with a solution of white vinegar and water (1 cup vinegar to 1 qt warm water [.2 L vinegar to 1 L warm water]). Close the bag and soak it for 30 minutes in the solution. 12. Rinse the bag with warm water. 61. Hang the bag to dry with the pour spout open and hanging downward. 14. Store the clean bag (once it is dry) in a clean plastic bag. 15. Wash your hands well. PREVENTING INFECTION  Wash your hands before and after handling your catheter.  Take showers daily and wash the area where the catheter enters your body. Do not take baths. Replace wet leg straps with dry ones, if this applies.  Do not use powders, sprays, or lotions on the genital area. Only use creams, lotions, or ointments as directed by your caregiver.  For females, wipe from front to back after each bowel movement.  Drink enough fluids to keep your urine clear or pale yellow unless you have a fluid restriction.  Do not let the drainage bag or tubing touch or lie on the floor.  Wear cotton underwear to absorb moisture and to keep your skin drier. SEEK MEDICAL CARE IF:   Your urine is cloudy or smells unusually bad.  Your catheter becomes clogged.  You are not draining urine into the bag or your bladder feels full.  Your catheter starts to  leak. SEEK IMMEDIATE MEDICAL CARE IF:   You have pain, swelling, redness, or pus where the catheter enters the body.  You have pain in the abdomen, legs, lower back, or bladder.  You have a fever.  You see blood fill the catheter, or your urine is pink or red.  You have nausea,  vomiting, or chills.  Your catheter gets pulled out. MAKE SURE YOU:   Understand these instructions.  Will watch your condition.  Will get help right away if you are not doing well or get worse.   This information is not intended to replace advice given to you by your health care provider. Make sure you discuss any questions you have with your health care provider.   Document Released: 12/13/2005 Document Revised: 04/29/2014 Document Reviewed: 12/04/2012   Elsevier Interactive Patient Education 2016 [place]   1) The drugs that you were given will stay in your system until tomorrow so for the next 24 hours you should not:  A) Drive an automobile B) Make any legal decisions C) Drink any alcoholic beverage   2) You may resume regular meals tomorrow.  Today it is better to start with liquids and gradually work up to solid foods.  You may eat anything you prefer, but it is better to start with liquids, then soup and crackers, and gradually work up to solid foods.   3) Please notify your doctor immediately if you have any unusual bleeding, trouble breathing, redness and pain at the surgery site, drainage, fever, or pain not relieved by medication.    4) Additional Instructions:        Please contact your physician with any problems or Same Day Surgery at [phone], Monday through Friday 6 am to 4 pm, or [place] at [facility] number at [phone].

## 2016-04-20 NOTE — H&P (Signed)
Date of Initial H&P:04/14/16  History reviewed, patient examined, no change in status, stable for surgery.

## 2016-04-20 NOTE — Anesthesia Preprocedure Evaluation (Signed)
Anesthesia Evaluation  Patient identified by MRN, date of birth, ID band Patient awake    Reviewed: Allergy & Precautions, H&P , NPO status , Patient's Chart, lab work & pertinent test results, reviewed documented beta blocker date and time   History of Anesthesia Complications Negative for: history of anesthetic complications  Airway Mallampati: I  TM Distance: >3 FB Neck ROM: full    Dental no notable dental hx. (+) Teeth Intact, Caps, Missing   Pulmonary neg pulmonary ROS, former smoker,    Pulmonary exam normal breath sounds clear to auscultation       Cardiovascular Exercise Tolerance: Good negative cardio ROS Normal cardiovascular exam Rhythm:regular Rate:Normal     Neuro/Psych negative neurological ROS  negative psych ROS   GI/Hepatic negative GI ROS, Neg liver ROS,   Endo/Other  diabetes, Well Controlled, Oral Hypoglycemic Agents  Renal/GU Renal disease (kidney stones)  negative genitourinary   Musculoskeletal   Abdominal   Peds  Hematology negative hematology ROS (+)   Anesthesia Other Findings Past Medical History:   Dyslipidemia                                                 Hyperglycemia                                                Herpes labialis                                                Comment:Upper Lip   Male hypogonadism                                            Thyroid nodule                                                 Comment:Right   Hyperlipidemia                                               Diabetes mellitus                                            Erectile dysfunction                                         Chronic kidney disease  Comment:H/O KIDNEY STONES   Adopted                                                      Thyroid nodule                                               Reproductive/Obstetrics negative OB ROS                              Anesthesia Physical Anesthesia Plan  ASA: II  Anesthesia Plan: General   Post-op Pain Management:    Induction:   Airway Management Planned:   Additional Equipment:   Intra-op Plan:   Post-operative Plan:   Informed Consent: I have reviewed the patients History and Physical, chart, labs and discussed the procedure including the risks, benefits and alternatives for the proposed anesthesia with the patient or authorized representative who has indicated his/her understanding and acceptance.   Dental Advisory Given  Plan Discussed with: Anesthesiologist, CRNA and Surgeon  Anesthesia Plan Comments:         Anesthesia Quick Evaluation

## 2016-04-20 NOTE — Op Note (Signed)
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 Yves Dill MD, FACS Anesthesia: Gen.  Indications:See the history and physical. After informed consent the above procedure(s) were requested     Technique and findings: After adequate general anesthesia had been obtained the patient was placed into dorsal lithotomy position and the perineum was prepped and draped in the usual fashion. The laser scope was coupled the camera and then visually advanced into the bladder. Patient was noted have trilobar BPH with visual obstruction. Prostatic urethral length was approximately 5 cm. Bladder was moderately trabeculated. Both ureteral orifices were identified and had clear eflux. No bladder tumors were identified. At this point the XPS greenlight laser fiber was introduced through the scope and power set at 80 W. Bladder neck tissue and median lobe were vaporized. Power was then increased to 120 W and tissue was vaporized back to the level of the verumontanum. Power was increased to 180 W and remaining obstructive tissue was vaporized. At this point the scope was removed. 10 cc of viscous Xylocaine was instilled within the urethra.  0000000 French silicone catheter was placed. Catheter was irrigated until clear. A B&O suppository was placed. The procedure was then terminated and patient transferred to the recovery room in stable condition.

## 2016-04-20 NOTE — Anesthesia Postprocedure Evaluation (Signed)
Anesthesia Post Note  Patient: Xavier Esparza  Procedure(s) Performed: Procedure(s) (LRB): GREEN LIGHT LASER TURP (TRANSURETHRAL RESECTION OF PROSTATE (N/A)  Patient location during evaluation: PACU Anesthesia Type: General Level of consciousness: awake and alert Pain management: pain level controlled Vital Signs Assessment: post-procedure vital signs reviewed and stable Respiratory status: spontaneous breathing and respiratory function stable Cardiovascular status: stable Anesthetic complications: no    Last Vitals:  Filed Vitals:   04/20/16 1717 04/20/16 1732  BP: 140/74 158/87  Pulse: 60 50  Temp:    Resp: 11 9    Last Pain:  Filed Vitals:   04/20/16 1741  PainSc: 4                  Conchita Truxillo K

## 2016-04-21 ENCOUNTER — Encounter: Payer: Self-pay | Admitting: Urology

## 2016-06-30 ENCOUNTER — Other Ambulatory Visit: Payer: Self-pay | Admitting: Internal Medicine

## 2016-07-10 ENCOUNTER — Telehealth: Payer: Self-pay

## 2016-07-10 NOTE — Telephone Encounter (Signed)
Pt had AWV in January and was instructed to have follow up in July.  Can one of you contact for the follow up indicated on AVS per PCP.

## 2016-07-12 NOTE — Telephone Encounter (Signed)
Left vm for patient to call back to schedule appt.

## 2016-08-10 ENCOUNTER — Other Ambulatory Visit: Payer: Self-pay | Admitting: *Deleted

## 2016-08-10 MED ORDER — METFORMIN HCL 500 MG PO TABS: 500.0000 mg | ORAL_TABLET | Freq: Every day | ORAL | 1 refills | Status: DC

## 2016-10-06 LAB — HM DIABETES EYE EXAM

## 2016-11-10 ENCOUNTER — Other Ambulatory Visit: Payer: Self-pay | Admitting: Internal Medicine

## 2016-11-19 ENCOUNTER — Other Ambulatory Visit: Payer: Self-pay | Admitting: Internal Medicine

## 2016-11-30 ENCOUNTER — Ambulatory Visit (INDEPENDENT_AMBULATORY_CARE_PROVIDER_SITE_OTHER): Payer: Medicare Other

## 2016-11-30 DIAGNOSIS — Z23 Encounter for immunization: Secondary | ICD-10-CM | POA: Diagnosis not present

## 2017-01-21 ENCOUNTER — Other Ambulatory Visit: Payer: Self-pay | Admitting: Internal Medicine

## 2017-03-02 ENCOUNTER — Other Ambulatory Visit: Payer: Self-pay | Admitting: Internal Medicine

## 2017-04-05 ENCOUNTER — Telehealth: Payer: Self-pay | Admitting: Internal Medicine

## 2017-04-05 NOTE — Telephone Encounter (Signed)
Pt called wanting to transfer from dr Jenny Reichmann to dr Damita Dunnings. [place] to [place]  Is it ok to schedule?

## 2017-04-05 NOTE — Telephone Encounter (Signed)
Ok with me 

## 2017-04-06 NOTE — Telephone Encounter (Signed)
Okay with me. Needs 30 minute appointment. Thanks.

## 2017-04-06 NOTE — Telephone Encounter (Signed)
April 30 Pt aware

## 2017-04-13 ENCOUNTER — Encounter: Payer: Medicare Other | Admitting: Internal Medicine

## 2017-04-25 ENCOUNTER — Ambulatory Visit (INDEPENDENT_AMBULATORY_CARE_PROVIDER_SITE_OTHER): Payer: Medicare Other | Admitting: Family Medicine

## 2017-04-25 ENCOUNTER — Encounter: Payer: Self-pay | Admitting: Family Medicine

## 2017-04-25 VITALS — BP 122/80 | HR 65 | Temp 98.9°F | Ht 72.0 in | Wt 214.8 lb

## 2017-04-25 DIAGNOSIS — Z789 Other specified health status: Secondary | ICD-10-CM | POA: Diagnosis not present

## 2017-04-25 DIAGNOSIS — C61 Malignant neoplasm of prostate: Secondary | ICD-10-CM

## 2017-04-25 DIAGNOSIS — E119 Type 2 diabetes mellitus without complications: Secondary | ICD-10-CM | POA: Diagnosis not present

## 2017-04-25 DIAGNOSIS — E7800 Pure hypercholesterolemia, unspecified: Secondary | ICD-10-CM

## 2017-04-25 DIAGNOSIS — E042 Nontoxic multinodular goiter: Secondary | ICD-10-CM | POA: Diagnosis not present

## 2017-04-25 DIAGNOSIS — R809 Proteinuria, unspecified: Secondary | ICD-10-CM

## 2017-04-25 LAB — COMPREHENSIVE METABOLIC PANEL
ALT: 21 U/L (ref 0–53)
AST: 22 U/L (ref 0–37)
Albumin: 4.2 g/dL (ref 3.5–5.2)
Alkaline Phosphatase: 43 U/L (ref 39–117)
BUN: 18 mg/dL (ref 6–23)
CALCIUM: 10.2 mg/dL (ref 8.4–10.5)
CHLORIDE: 102 meq/L (ref 96–112)
CO2: 30 meq/L (ref 19–32)
Creatinine, Ser: 1.19 mg/dL (ref 0.40–1.50)
GFR: 77.27 mL/min (ref >60.00)
Glucose, Bld: 89 mg/dL (ref 70–99)
POTASSIUM: 4.8 meq/L (ref 3.5–5.1)
Sodium: 137 mEq/L (ref 135–145)
Total Bilirubin: 0.7 mg/dL (ref 0.2–1.2)
Total Protein: 7.1 g/dL (ref 6.0–8.3)

## 2017-04-25 LAB — MICROALBUMIN / CREATININE URINE RATIO
CREATININE, U: 280.6 mg/dL
Microalb Creat Ratio: 1.5 mg/g (ref 0.0–30.0)
Microalb, Ur: 4.3 mg/dL — ABNORMAL HIGH (ref 0.0–1.9)

## 2017-04-25 LAB — LIPID PANEL
CHOL/HDL RATIO: 3
Cholesterol: 146 mg/dL (ref 0–200)
HDL: 46 mg/dL (ref >39.00)
LDL CALC: 64 mg/dL (ref 0–99)
NONHDL: 99.64
TRIGLYCERIDES: 178 mg/dL — AB (ref 0.0–149.0)
VLDL: 35.6 mg/dL (ref 0.0–40.0)

## 2017-04-25 LAB — PSA, MEDICARE: PSA: 2.05 ng/ml (ref 0.10–4.00)

## 2017-04-25 LAB — HEMOGLOBIN A1C: Hgb A1c MFr Bld: 6.2 % (ref 4.6–6.5)

## 2017-04-25 LAB — VITAMIN B12: VITAMIN B 12: 608 pg/mL (ref 211–911)

## 2017-04-25 NOTE — Progress Notes (Signed)
Pre visit review using our clinic review tool, if applicable. No additional management support is needed unless otherwise documented below in the visit note. 

## 2017-04-25 NOTE — Patient Instructions (Signed)
Go to the lab on the way out.  We'll contact you with your lab report. Don't change your meds for now.  Take care.  Glad to see you.  Plan on a recheck in about 3 months, labs ahead of time.  We'll request your records from urology.

## 2017-04-26 ENCOUNTER — Encounter: Payer: Self-pay | Admitting: Family Medicine

## 2017-04-26 DIAGNOSIS — R809 Proteinuria, unspecified: Secondary | ICD-10-CM | POA: Insufficient documentation

## 2017-04-26 DIAGNOSIS — C61 Malignant neoplasm of prostate: Secondary | ICD-10-CM | POA: Insufficient documentation

## 2017-04-26 DIAGNOSIS — Z789 Other specified health status: Secondary | ICD-10-CM | POA: Insufficient documentation

## 2017-04-26 NOTE — Assessment & Plan Note (Addendum)
Continue current medications. Continue work on diet and exercise. See notes on labs.

## 2017-04-26 NOTE — Assessment & Plan Note (Signed)
See notes on labs. 

## 2017-04-26 NOTE — Assessment & Plan Note (Signed)
Discussed with patient. See notes on B12.

## 2017-04-26 NOTE — Assessment & Plan Note (Signed)
Per urology. Requesting records. See notes on PSA.

## 2017-04-26 NOTE — Assessment & Plan Note (Signed)
Will check on labs and records.

## 2017-04-26 NOTE — Progress Notes (Signed)
New patient. Will request and review old records.  History of thyroid nodule. Per patient had previous benign biopsy. Will review old records.  Hyperlipidemia. He has been alternating Crestor and red yeast rice. No adverse effect on medications. Due for follow-up labs. Discussed with patient about diet and exercise.  History of prostate cancer. Per EMR reviewed he had gradually increasing PSA levels, but I do not see any PSA greater than 4, on initial review of records. Per patient he had previous treatment for BPH with incidental detection of cancer. Followed by urology. Status post Lupron injection. Decrease in sex drive and erectile function with that medication. Due for follow-up PSA. He has follow-up with urology pending.  Diabetes. Due for labs. No sugar checks at home. Occasional foot tingling/sensation change but no foot pain. No skin breakdown on the feet. Compliant with her medications.  Vegetarian diet. He only eats meat 3 times a year (Thanksgiving, Christmas, homecoming). Reasonable to check B12 level.  The patients son recently died and offered my condolences  PMH and SH reviewed  ROS: Per HPI unless specifically indicated in ROS section   Meds, vitals, and allergies reviewed.   GEN: nad, alert and oriented HEENT: mucous membranes moist NECK: supple w/o LA CV: rrr. PULM: ctab, no inc wob ABD: soft, +bs EXT: no edema SKIN: no acute rash  Diabetic foot exam: Normal inspection No skin breakdown No calluses  Normal DP pulses Normal sensation to light touch and monofilament Nails normal

## 2017-04-26 NOTE — Assessment & Plan Note (Signed)
>  40 minutes spent in face to face time with patient, >50% spent in counselling or coordination of care.  Pathophysiology of diabetes discussed with patient. See notes on labs. Rationale for metformin use discussed with patient. He has some mild tingling in his foot but of normal foot exam today. Normal monofilament testing. We can follow this is as we go along. Eye exam is up-to-date.  Discussed with patient about low-carb diet, handout given.  He understood.

## 2017-04-27 ENCOUNTER — Telehealth: Payer: Self-pay | Admitting: Family Medicine

## 2017-04-27 NOTE — Telephone Encounter (Signed)
Patient returned Lugene's call about his lab work.

## 2017-04-27 NOTE — Telephone Encounter (Signed)
Left message on voicemail for patient to call back. 

## 2017-04-28 NOTE — Telephone Encounter (Signed)
Spoke with patient.

## 2017-06-30 ENCOUNTER — Other Ambulatory Visit: Payer: Self-pay | Admitting: Internal Medicine

## 2017-06-30 NOTE — Telephone Encounter (Signed)
Sent. Thanks.   

## 2017-07-27 ENCOUNTER — Other Ambulatory Visit: Payer: Self-pay | Admitting: *Deleted

## 2017-07-27 ENCOUNTER — Other Ambulatory Visit: Payer: Medicare Other

## 2017-07-27 DIAGNOSIS — E119 Type 2 diabetes mellitus without complications: Secondary | ICD-10-CM

## 2017-07-29 ENCOUNTER — Ambulatory Visit: Payer: Medicare Other | Admitting: Family Medicine

## 2017-08-02 ENCOUNTER — Ambulatory Visit (INDEPENDENT_AMBULATORY_CARE_PROVIDER_SITE_OTHER): Payer: Medicare Other | Admitting: Family Medicine

## 2017-08-02 ENCOUNTER — Encounter: Payer: Self-pay | Admitting: Family Medicine

## 2017-08-02 VITALS — BP 144/80 | HR 69 | Temp 98.8°F | Wt 220.2 lb

## 2017-08-02 DIAGNOSIS — Z1159 Encounter for screening for other viral diseases: Secondary | ICD-10-CM | POA: Diagnosis not present

## 2017-08-02 DIAGNOSIS — C61 Malignant neoplasm of prostate: Secondary | ICD-10-CM

## 2017-08-02 DIAGNOSIS — E119 Type 2 diabetes mellitus without complications: Secondary | ICD-10-CM

## 2017-08-02 DIAGNOSIS — Z8639 Personal history of other endocrine, nutritional and metabolic disease: Secondary | ICD-10-CM | POA: Diagnosis not present

## 2017-08-02 DIAGNOSIS — D1724 Benign lipomatous neoplasm of skin and subcutaneous tissue of left leg: Secondary | ICD-10-CM

## 2017-08-02 LAB — HEMOGLOBIN A1C: HEMOGLOBIN A1C: 6.4 % (ref 4.6–6.5)

## 2017-08-02 NOTE — Patient Instructions (Addendum)
Go to the lab on the way out.  We'll contact you with your lab report. We'll make plans about possible diabetes and microalbumin testing when I see your labs.  Update me if you want to get the lipoma taken out.  Take care.  Glad to see you.  Update me as needed.

## 2017-08-02 NOTE — H&P (Signed)
NAME:  Xavier Esparza, ZINN                       ACCOUNT NO.:  MEDICAL RECORD NO.:  [id number]  LOCATION:                                 FACILITY:  PHYSICIAN:  Maryan Puls          DATE OF BIRTH:  December 25, 1946  DATE OF ADMISSION: DATE OF DISCHARGE:                            HISTORY AND PHYSICAL   SAME-DAY SURGERY:  August 09, 2017.  CHIEF COMPLAINT:  Kidney stone.  HISTORY OF PRESENT ILLNESS:  Mr. Xavier Esparza is a 71 year old African American male with right flank pain, found to have an 8 x 11 mm stone in a right ureterocele.  This was found on CT scan and confirmed with KUB.  He comes in now for right ureteroscopic ureterolithotomy with holmium laser lithotripsy.  PAST MEDICAL HISTORY: 1. Allergies:  No drug allergies. 2. Current Medications:  Metformin, aspirin, Crestor, calcium,     multivitamins, acyclovir, fish oil, and sildenafil. 3. Past Surgical History:     a.     Cataract removal in 2016.     b.     Photovaporization of prostate with GreenLight laser in 2017.     c.     Right inguinal herniorrhaphy.     d.     I-125 brachytherapy for prostate cancer, June 15, 2017. 4. Past and current medical conditions:     a.     Diabetes.     b.     Hypercholesterolemia.     c.     History of prostate cancer.     d.     Erectile dysfunction.  REVIEW OF SYSTEMS:  The patient denies chest pain, shortness of breath, stroke, or hypertension.  SOCIAL HISTORY:  The patient quit smoking in 1995, with a 25-pack-year history.  He consumes wine on social occasions.  FAMILY HISTORY:  Unknown as he is adopted.  EXAMINATION:  GENERAL:  Well-nourished, African American male, in no acute distress. HEENT:  Sclerae were clear.  Pupils were equally round and reactive to light and accommodation.  Extraocular movements were intact. NECK:  Supple.  No palpable cervical adenopathy. LUNGS:  Clear to auscultation. CARDIOVASCULAR:  Regular rhythm rate without audible murmur. ABDOMEN:  Soft, nontender  abdomen. GU:  The patient was circumcised.  Testes were smooth and nontender. RECTAL:  30-g, smooth, nontender prostate. NEUROMUSCULAR:  Alert and oriented x3.  IMPRESSION:  Right ureterolithiasis with ureterocele.  PLAN:  Right ureteroscopic ureterolithotomy with holmium laser lithotripsy.          ______________________________ Maryan Puls     MW/MEDQ  D:  08/01/2017  T:  08/02/2017  Job:  [id number]

## 2017-08-02 NOTE — Progress Notes (Signed)
He had seed implant into prostate per urology.  He has f/u urology pending.    Diabetes:  Off metformin, no meds at this point, labs pending, see notes on prev labs.   Hypoglycemic episodes: no sx  Hyperglycemic episodes: no sx Feet problems: no Blood Sugars averaging: not checked.   eye exam within last year: yes A1c pending.  MALB prev positive, we are going to address A1c prev.    Flu shot encouraged.    Pt opts in for HCV screening.  D/w pt re: routine screening.    Meds, vitals, and allergies reviewed.   ROS: Per HPI unless specifically indicated in ROS section   GEN: nad, alert and oriented HEENT: mucous membranes moist NECK: supple w/o LA CV: rrr. PULM: ctab, no inc wob ABD: soft, +bs EXT: no edema SKIN: no acute rash, likely lipoma in the L thigh.  Not ttp.  Present for months at least.

## 2017-08-03 ENCOUNTER — Encounter
Admission: RE | Admit: 2017-08-03 | Discharge: 2017-08-03 | Disposition: A | Discharge status: Home / Self Care | Payer: Medicare Other | Source: Ambulatory Visit | Attending: Urology | Admitting: Urology

## 2017-08-03 DIAGNOSIS — E119 Type 2 diabetes mellitus without complications: Secondary | ICD-10-CM | POA: Insufficient documentation

## 2017-08-03 DIAGNOSIS — I4510 Unspecified right bundle-branch block: Secondary | ICD-10-CM | POA: Insufficient documentation

## 2017-08-03 DIAGNOSIS — Z01818 Encounter for other preprocedural examination: Secondary | ICD-10-CM | POA: Insufficient documentation

## 2017-08-03 DIAGNOSIS — R9439 Abnormal result of other cardiovascular function study: Secondary | ICD-10-CM | POA: Insufficient documentation

## 2017-08-03 DIAGNOSIS — D179 Benign lipomatous neoplasm, unspecified: Secondary | ICD-10-CM | POA: Insufficient documentation

## 2017-08-03 LAB — HEPATITIS C ANTIBODY: HCV Ab: NONREACTIVE

## 2017-08-03 NOTE — Assessment & Plan Note (Signed)
Incidentally noted, reassured. He can update me if he wants to have this removed.

## 2017-08-03 NOTE — Patient Instructions (Signed)
  Your procedure is scheduled on: 08-09-17 TUESDAY Report to Same Day Surgery 2nd floor medical mall [facility] Entrance-take elevator on left to 2nd floor.  Check in with surgery information desk.) To find out your arrival time please call [phone] between 1PM - 3PM on 08-08-17 MONDAY  Remember: Instructions that are not followed completely may result in serious medical risk, up to and including death, or upon the discretion of your surgeon and anesthesiologist your surgery may need to be rescheduled.    _x___ 1. Do not eat food or drink liquids after midnight. No gum chewing or hard candies.     __x__ 2. No Alcohol for 24 hours before or after surgery.   __x__3. No Smoking for 24 prior to surgery.   ____  4. Bring all medications with you on the day of surgery if instructed.    __x__ 5. Notify your doctor if there is any change in your medical condition     (cold, fever, infections).     Do not wear jewelry, make-up, hairpins, clips or nail polish.  Do not wear lotions, powders, or perfumes. You may wear deodorant.  Do not shave 48 hours prior to surgery. Men may shave face and neck.  Do not bring valuables to the hospital.    [facility] is not responsible for any belongings or valuables.               Contacts, dentures or bridgework may not be worn into surgery.  Leave your suitcase in the car. After surgery it may be brought to your room.  For patients admitted to the hospital, discharge time is determined by your treatment team.   Patients discharged the day of surgery will not be allowed to drive home.  You will need someone to drive you home and stay with you the night of your procedure.    Please read over the following fact sheets that you were given:   [facility] and or MRSA Information   _x___ TAKE THE FOLLOWING MEDICATION THE MORNING OF SURGERY WITH A SMALL SIP OF WATER. These include:  1. CRESTOR  (ROSUVASTATIN)  2.  3.  4.  5.  6.  ____Fleets enema or Magnesium Citrate as directed.   ____ Use CHG Soap or sage wipes as directed on instruction sheet   ____ Use inhalers on the day of surgery and bring to hospital day of surgery  ____ Stop Metformin and Janumet 2 days prior to surgery.    ____ Take 1/2 of usual insulin dose the night before surgery and none on the morning surgery.   ____ Follow recommendations from Cardiologist, Pulmonologist or PCP regarding stopping Aspirin, Coumadin, Pllavix ,Eliquis, Effient, or Pradaxa, and Pletal.  X____Stop Anti-inflammatories such as Advil, Aleve, Ibuprofen, Motrin, Naproxen, Naprosyn, Goodies powders or aspirin products NOW-OK to take Tylenol    _x___ Stop supplements until after surgery-STOP FISH OIL, RED YEAST RICE AND GLUCOSAMINE NOW-MAY RESUME AFTER SURGERY   ____ Bring C-Pap to the hospital.

## 2017-08-03 NOTE — Assessment & Plan Note (Signed)
See notes on labs.  A1c is only slightly higher than before, up to 6.4. By A1c he is not diabetic. He is nearly diabetic, but has not crossed 6.5 threshold. I think it is reasonable to recheck his A1c in about 3 months, along with a microalbumin. He needs to do his continue as best he can on a low carbohydrate diet and exercise.  If his A1c goes back up, or if his microalbumin is persistently positive, then we can address that.

## 2017-08-03 NOTE — Assessment & Plan Note (Signed)
Per u rology 

## 2017-08-04 ENCOUNTER — Encounter
Admission: RE | Admit: 2017-08-04 | Discharge: 2017-08-04 | Disposition: A | Discharge status: Home / Self Care | Payer: Medicare Other | Source: Ambulatory Visit | Attending: Urology | Admitting: Urology

## 2017-08-04 DIAGNOSIS — E119 Type 2 diabetes mellitus without complications: Secondary | ICD-10-CM | POA: Diagnosis not present

## 2017-08-04 DIAGNOSIS — Z01818 Encounter for other preprocedural examination: Secondary | ICD-10-CM | POA: Diagnosis present

## 2017-08-04 DIAGNOSIS — I4510 Unspecified right bundle-branch block: Secondary | ICD-10-CM | POA: Diagnosis not present

## 2017-08-04 DIAGNOSIS — R9439 Abnormal result of other cardiovascular function study: Secondary | ICD-10-CM | POA: Diagnosis not present

## 2017-08-08 ENCOUNTER — Encounter: Payer: Self-pay | Admitting: *Deleted

## 2017-08-09 ENCOUNTER — Ambulatory Visit: Payer: Medicare Other | Admitting: Anesthesiology

## 2017-08-09 ENCOUNTER — Ambulatory Visit
Admission: RE | Admit: 2017-08-09 | Discharge: 2017-08-09 | Disposition: A | Discharge status: Home / Self Care | Payer: Medicare Other | Source: Ambulatory Visit | Attending: Urology | Admitting: Urology

## 2017-08-09 ENCOUNTER — Encounter: Payer: Self-pay | Admitting: *Deleted

## 2017-08-09 ENCOUNTER — Encounter
Admission: RE | Disposition: A | Discharge status: Home / Self Care | Payer: Self-pay | Source: Ambulatory Visit | Attending: Urology

## 2017-08-09 DIAGNOSIS — Z7982 Long term (current) use of aspirin: Secondary | ICD-10-CM | POA: Insufficient documentation

## 2017-08-09 DIAGNOSIS — N2889 Other specified disorders of kidney and ureter: Secondary | ICD-10-CM | POA: Diagnosis present

## 2017-08-09 DIAGNOSIS — Z8546 Personal history of malignant neoplasm of prostate: Secondary | ICD-10-CM | POA: Insufficient documentation

## 2017-08-09 DIAGNOSIS — E119 Type 2 diabetes mellitus without complications: Secondary | ICD-10-CM | POA: Insufficient documentation

## 2017-08-09 DIAGNOSIS — N201 Calculus of ureter: Secondary | ICD-10-CM | POA: Insufficient documentation

## 2017-08-09 DIAGNOSIS — E041 Nontoxic single thyroid nodule: Secondary | ICD-10-CM | POA: Diagnosis not present

## 2017-08-09 DIAGNOSIS — E7800 Pure hypercholesterolemia, unspecified: Secondary | ICD-10-CM | POA: Diagnosis not present

## 2017-08-09 DIAGNOSIS — Z87891 Personal history of nicotine dependence: Secondary | ICD-10-CM | POA: Insufficient documentation

## 2017-08-09 DIAGNOSIS — Z79899 Other long term (current) drug therapy: Secondary | ICD-10-CM | POA: Diagnosis not present

## 2017-08-09 DIAGNOSIS — Z9889 Other specified postprocedural states: Secondary | ICD-10-CM | POA: Diagnosis not present

## 2017-08-09 HISTORY — PX: CYSTOSCOPY W/ URETERAL STENT PLACEMENT: SHX1429

## 2017-08-09 HISTORY — PX: URETEROSCOPY WITH HOLMIUM LASER LITHOTRIPSY: SHX6645

## 2017-08-09 LAB — GLUCOSE, CAPILLARY
GLUCOSE-CAPILLARY: 101 mg/dL — AB (ref 65–99)
Glucose-Capillary: 87 mg/dL (ref 65–99)

## 2017-08-09 SURGERY — URETEROSCOPY, WITH LITHOTRIPSY USING HOLMIUM LASER
Anesthesia: General | Laterality: Right

## 2017-08-09 MED ORDER — ONDANSETRON HCL 4 MG/2ML IJ SOLN
INTRAMUSCULAR | Status: DC | PRN
  Administered 2017-08-09: 4 mg via INTRAVENOUS

## 2017-08-09 MED ORDER — HYOSCYAMINE SULFATE SL 0.125 MG SL SUBL: SUBLINGUAL_TABLET | SUBLINGUAL | 3 refills | Status: DC

## 2017-08-09 MED ORDER — KETOROLAC TROMETHAMINE 30 MG/ML IJ SOLN
INTRAMUSCULAR | Status: AC
  Filled 2017-08-09: qty 1

## 2017-08-09 MED ORDER — HYOSCYAMINE SULFATE 0.125 MG SL SUBL
0.1250 mg | SUBLINGUAL_TABLET | SUBLINGUAL | Status: DC | PRN
  Administered 2017-08-09 (×2): 0.125 mg via SUBLINGUAL
  Filled 2017-08-09 (×3): qty 2

## 2017-08-09 MED ORDER — OXYCODONE HCL 5 MG PO TABS
ORAL_TABLET | ORAL | Status: AC
  Administered 2017-08-09: 5 mg
  Filled 2017-08-09: qty 1

## 2017-08-09 MED ORDER — ONDANSETRON 8 MG PO TBDP: 4.0000 mg | ORAL_TABLET | Freq: Four times a day (QID) | ORAL | 3 refills | Status: DC | PRN

## 2017-08-09 MED ORDER — FUROSEMIDE 10 MG/ML IJ SOLN
INTRAMUSCULAR | Status: AC
  Administered 2017-08-09: 10 mg via INTRAVENOUS
  Filled 2017-08-09: qty 2

## 2017-08-09 MED ORDER — FENTANYL CITRATE (PF) 100 MCG/2ML IJ SOLN: 25.0000 ug | INTRAMUSCULAR | Status: DC | PRN

## 2017-08-09 MED ORDER — PROMETHAZINE HCL 25 MG/ML IJ SOLN
INTRAMUSCULAR | Status: AC
  Administered 2017-08-09: 6.25 mg via INTRAVENOUS
  Filled 2017-08-09: qty 1

## 2017-08-09 MED ORDER — FENTANYL CITRATE (PF) 100 MCG/2ML IJ SOLN
INTRAMUSCULAR | Status: AC
  Filled 2017-08-09: qty 2

## 2017-08-09 MED ORDER — LACTATED RINGERS IV SOLN: INTRAVENOUS | Status: DC

## 2017-08-09 MED ORDER — LIDOCAINE HCL 2 % EX GEL
CUTANEOUS | Status: AC
Start: 2017-08-09 — End: ?
  Filled 2017-08-09: qty 10

## 2017-08-09 MED ORDER — ONDANSETRON HCL 4 MG/2ML IJ SOLN
INTRAMUSCULAR | Status: AC
  Filled 2017-08-09: qty 2

## 2017-08-09 MED ORDER — PROPOFOL 10 MG/ML IV BOLUS
INTRAVENOUS | Status: DC | PRN
  Administered 2017-08-09: 200 mg via INTRAVENOUS

## 2017-08-09 MED ORDER — NUCYNTA 50 MG PO TABS: 50.0000 mg | ORAL_TABLET | Freq: Four times a day (QID) | ORAL | 0 refills | Status: DC | PRN

## 2017-08-09 MED ORDER — ONDANSETRON HCL 4 MG/2ML IJ SOLN
4.0000 mg | Freq: Once | INTRAMUSCULAR | Status: AC | PRN
  Administered 2017-08-09: 4 mg via INTRAVENOUS

## 2017-08-09 MED ORDER — PROMETHAZINE HCL 25 MG/ML IJ SOLN
6.2500 mg | Freq: Once | INTRAMUSCULAR | Status: AC
  Administered 2017-08-09: 6.25 mg via INTRAVENOUS

## 2017-08-09 MED ORDER — LEVOFLOXACIN IN D5W 500 MG/100ML IV SOLN
INTRAVENOUS | Status: AC
  Filled 2017-08-09: qty 100

## 2017-08-09 MED ORDER — MIDAZOLAM HCL 2 MG/2ML IJ SOLN
INTRAMUSCULAR | Status: DC | PRN
  Administered 2017-08-09: 2 mg via INTRAVENOUS

## 2017-08-09 MED ORDER — ONDANSETRON HCL 4 MG/2ML IJ SOLN
INTRAMUSCULAR | Status: AC
  Administered 2017-08-09: 4 mg via INTRAVENOUS
  Filled 2017-08-09: qty 2

## 2017-08-09 MED ORDER — PHENYLEPHRINE HCL 10 MG/ML IJ SOLN
INTRAMUSCULAR | Status: DC | PRN
  Administered 2017-08-09 (×2): 100 ug via INTRAVENOUS

## 2017-08-09 MED ORDER — LIDOCAINE HCL (CARDIAC) 20 MG/ML IV SOLN
INTRAVENOUS | Status: DC | PRN
  Administered 2017-08-09: 100 mg via INTRAVENOUS

## 2017-08-09 MED ORDER — FUROSEMIDE 10 MG/ML IJ SOLN
10.0000 mg | Freq: Once | INTRAMUSCULAR | Status: AC
  Administered 2017-08-09: 10 mg via INTRAVENOUS

## 2017-08-09 MED ORDER — LIDOCAINE HCL 2 % EX GEL
CUTANEOUS | Status: DC | PRN
  Administered 2017-08-09: 1 via URETHRAL

## 2017-08-09 MED ORDER — FAMOTIDINE 20 MG PO TABS
ORAL_TABLET | ORAL | Status: AC
  Filled 2017-08-09: qty 1

## 2017-08-09 MED ORDER — SODIUM CHLORIDE 0.9 % IV SOLN
INTRAVENOUS | Status: DC
  Administered 2017-08-09: 13:00:00 via INTRAVENOUS

## 2017-08-09 MED ORDER — FAMOTIDINE 20 MG PO TABS: 20.0000 mg | ORAL_TABLET | Freq: Once | ORAL | Status: DC

## 2017-08-09 MED ORDER — MIDAZOLAM HCL 2 MG/2ML IJ SOLN
INTRAMUSCULAR | Status: AC
  Filled 2017-08-09: qty 2

## 2017-08-09 MED ORDER — LEVOFLOXACIN IN D5W 500 MG/100ML IV SOLN
500.0000 mg | Freq: Once | INTRAVENOUS | Status: AC
  Administered 2017-08-09: 500 mg via INTRAVENOUS

## 2017-08-09 MED ORDER — PROPOFOL 10 MG/ML IV BOLUS
INTRAVENOUS | Status: AC
  Filled 2017-08-09: qty 20

## 2017-08-09 MED ORDER — CIPROFLOXACIN HCL 500 MG PO TABS: 500.0000 mg | ORAL_TABLET | Freq: Two times a day (BID) | ORAL | 0 refills | Status: DC

## 2017-08-09 MED ORDER — LIDOCAINE HCL (PF) 2 % IJ SOLN
INTRAMUSCULAR | Status: AC
  Filled 2017-08-09: qty 2

## 2017-08-09 MED ORDER — FENTANYL CITRATE (PF) 100 MCG/2ML IJ SOLN
INTRAMUSCULAR | Status: DC | PRN
  Administered 2017-08-09 (×3): 50 ug via INTRAVENOUS

## 2017-08-09 SURGICAL SUPPLY — 25 items
BAG DRAIN CYSTO-URO LG1000N (MISCELLANEOUS) ×4 IMPLANT
CATH URETL 5X70 OPEN END (CATHETERS) ×4 IMPLANT
CNTNR SPEC 2.5X3XGRAD LEK (MISCELLANEOUS) ×2
CONRAY 43 FOR UROLOGY 50M (MISCELLANEOUS) ×4 IMPLANT
CONT SPEC 4OZ STER OR WHT (MISCELLANEOUS) ×2
CONT SPEC 4OZ STRL OR WHT (MISCELLANEOUS) ×2
CONTAINER SPEC 2.5X3XGRAD LEK (MISCELLANEOUS) ×2 IMPLANT
FEE TECHNICIAN ONLY PER HOUR (MISCELLANEOUS) ×2 IMPLANT
FIBER LASER 365 (Laser) ×2 IMPLANT
GLOVE BIO SURGEON STRL SZ7 (GLOVE) ×8 IMPLANT
GOWN STRL REUS W/ TWL LRG LVL4 (GOWN DISPOSABLE) ×2 IMPLANT
GOWN STRL REUS W/TWL LRG LVL4 (GOWN DISPOSABLE) ×4
GOWN STRL REUS W/TWL XL LVL4 (GOWN DISPOSABLE) ×4 IMPLANT
GUIDEWIRE STR ZIPWIRE 035X150 (MISCELLANEOUS) ×4 IMPLANT
KIT RM TURNOVER CYSTO AR (KITS) ×4 IMPLANT
PACK CYSTO AR (MISCELLANEOUS) ×4 IMPLANT
PERCUFLEX PLUS STENT 6X26 ×2 IMPLANT
SET CYSTO W/LG BORE CLAMP LF (SET/KITS/TRAYS/PACK) ×4 IMPLANT
SOL .9 NS 3000ML IRR  AL (IV SOLUTION) ×2
SOL .9 NS 3000ML IRR AL (IV SOLUTION) ×2
SOL .9 NS 3000ML IRR UROMATIC (IV SOLUTION) ×2 IMPLANT
SOL PREP PVP 2OZ (MISCELLANEOUS) ×4
SOLUTION PREP PVP 2OZ (MISCELLANEOUS) ×2 IMPLANT
SURGILUBE 2OZ TUBE FLIPTOP (MISCELLANEOUS) ×4 IMPLANT
WATER STERILE IRR 1000ML POUR (IV SOLUTION) ×4 IMPLANT

## 2017-08-09 NOTE — Transfer of Care (Signed)
Immediate Anesthesia Transfer of Care Note  Patient: Xavier Esparza  Procedure(s) Performed: Procedure(s): URETEROSCOPY WITH HOLMIUM LASER LITHOTRIPSY (Right)  Patient Location: PACU  Anesthesia Type:General  Level of Consciousness: drowsy and patient cooperative  Airway & Oxygen Therapy: Patient Spontanous Breathing and Patient connected to face mask oxygen  Post-op Assessment: Report given to RN, Post -op Vital signs reviewed and stable and Patient moving all extremities X 4  Post vital signs: Reviewed and stable  Last Vitals:  Vitals:   08/09/17 1249 08/09/17 1422  BP: (!) 151/101 (!) 148/83  Pulse: 67 62  Resp: 16 11  Temp: 37.1 C (!) 36.1 C  SpO2: 100% 100%    Last Pain:  Vitals:   08/09/17 1249  TempSrc: Oral         Complications: No apparent anesthesia complications

## 2017-08-09 NOTE — Op Note (Signed)
Preoperative diagnosis: 1. Right ureterolithiasis                                              2. Right ureterocele  Postoperative diagnosis: Same   Procedure: 1. Right ureteroscopic ureterolithotomy with holmium laser lithotripsy                      2. Incision of right ureterocele with holmium laser    Surgeon: Otelia Limes. Yves Dill MD  Anesthesia: General  Indications:See the history and physical. After informed consent the above procedure(s) were requested     Technique and findings: After adequate general anesthesia been obtained the patient was placed into dorsal lithotomy position and the perineum was prepped and draped in the usual fashion. The 12 mm stone was identified at the UVJ on the right using fluoroscopy. The 21 French cystoscope was coupled camera and visually advanced into the bladder. Patient had moderate lateral lobe prostatic hypertrophy. No bladder tumors were identified. A 0.035 Glidewire was then passed up the right ureter under fluoroscopic guidance. The cystic scope was removed, taking care to leave the guidewire in position. The 4.5 short mini rigid ureteroscope was coupled to the camera and advanced into the right ureter. The stone was identified. The 365  holmium laser fiber was selected and power set at 0.6 with a frequency of 5. The laser fiber was delivered to the stone and the stone was fragmented into powder or fragments less than 2 mm in size. The roof of the ureterocele was incised using the holmium laser and remaining fragments of evacuated from the ureter. At this point the ureteroscope was removed and the cystoscope backloaded over the guidewire. A 6 x 26 cm double pigtail ureteral stent with retrieval suture was advanced up the ureter using fluoroscopic guidance. The guidewire was removed taking care leaving the stent in position. The bladder was drained and the cystoscope was removed. 10 cc of viscous Xylocaine was instilled within the urethra. The procedure was  then terminated and patient transferred to the recovery room in stable condition.

## 2017-08-09 NOTE — Discharge Instructions (Signed)
Kidney Stones Kidney stones (urolithiasis) are rock-like masses that form inside of the kidneys. Kidneys are organs that make pee (urine). A kidney stone can cause very bad pain and can block the flow of pee. The stone usually leaves your body (passes) through your pee. You may need to have a doctor take out the stone. Follow these instructions at home: Eating and drinking  Drink enough fluid to keep your pee clear or pale yellow. This will help you pass the stone.  If told by your doctor, change the foods you eat (your diet). This may include: ? Limiting how much salt (sodium) you eat. ? Eating more fruits and vegetables. ? Limiting how much meat, poultry, fish, and eggs you eat.  Follow instructions from your doctor about eating or drinking restrictions. General instructions  Collect pee samples as told by your doctor. You may need to collect a pee sample: ? 24 hours after a stone comes out. ? 8-12 weeks after a stone comes out, and every 6-12 months after that.  Strain your pee every time you pee (urinate), for as long as told. Use the strainer that your doctor recommends.  Do not throw out the stone. Keep it so that it can be tested by your doctor.  Take over-the-counter and prescription medicines only as told by your doctor.  Keep all follow-up visits as told by your doctor. This is important. You may need follow-up tests. Preventing kidney stones To prevent another kidney stone:  Drink enough fluid to keep your pee clear or pale yellow. This is the best way to prevent kidney stones.  Eat healthy foods.  Avoid certain foods as told by your doctor. You may be told to eat less protein.  Stay at a healthy weight.  Contact a doctor if:  You have pain that gets worse or does not get better with medicine. Get help right away if:  You have a fever or chills.  You get very bad pain.  You get new pain in your belly (abdomen).  You pass out (faint).  You cannot  pee. This information is not intended to replace advice given to you by your health care provider. Make sure you discuss any questions you have with your health care provider. Document Released: 05/31/2008 Document Revised: 08/31/2016 Document Reviewed: 08/31/2016 Elsevier Interactive Patient Education  2017 [place] for Kidney Stones, Care After Refer to this sheet in the next few weeks. These instructions provide you with information on caring for yourself after your procedure. Your health care provider may also give you more specific instructions. Your treatment has been planned according to current medical practices, but problems sometimes occur. Call your health care provider if you have any problems or questions after your procedure. What can I expect after the procedure? After the procedure, it is common to have:  Pain.  A burning sensation while urinating.  Small amounts of blood in your urine.  A need to urinate frequently.  Pieces of kidney stone in your urine.  Mild discomfort when urinating that is felt in the tip of the penis in men or in the back. You may experience this if you have a flexible tube (stent) in your ureter.  Follow these instructions at home:  Take over-the-counter and prescription medicines only as told by your health care provider.  Take your antibiotic medicine as told by your health care provider. Do not stop taking the antibiotic even if you start to feel better.  Drink enough fluid to keep your urine clear or pale yellow. Your health care provider may recommend drinking two 8-ounce glasses of water per hour for a few hours after your procedure.  Return to your normal activities as told by your health care provider. Ask your health care provider what activities are safe for you.  If your health care provider approves, you may take a warm bath to ease discomfort and burning.  Do notdrivefor 24 hours if you were given a  medicine to help you relax (sedative).  Keep all follow-up visits as told by your health care provider. This is important. If you have a stent, you will need to return to your health care provider to have the stent removed. Contact a health care provider if:  You have pain or a burning feeling that lasts more than two days.  You feel nauseous.  You vomit more and more often.  You have difficulty urinating.  You have pain that gets worse or does not get better with medicine. Get help right away if:  You are unable to urinate, even if your bladder feels full.  You have bright red blood or blood clots in your urine.  You have more blood in your urine.  You have severe pain or discomfort.  You have a fever or shaking chills.  You have abdominal pain. This information is not intended to replace advice given to you by your health care provider. Make sure you discuss any questions you have with your health care provider. Document Released: 01/09/2016 Document Revised: 05/20/2016 Document Reviewed: 11/06/2015 Elsevier Interactive Patient Education  [employer].   Ureteroscopy Ureteroscopy is a procedure to check for and treat problems inside part of the urinary tract. In this procedure, a thin, tube-shaped instrument with a light at the end (ureteroscope) is used to look at the inside of the kidneys and the ureters, which are the tubes that carry urine from the kidneys to the bladder. The ureteroscope is inserted into one or both of the ureters. You may need this procedure if you have frequent urinary tract infections (UTIs), blood in your urine, or a stone in one of your ureters. A ureteroscopy can be done to find the cause of urine blockage in a ureter and to evaluate other abnormalities inside the ureters or kidneys. If stones are found, they can be removed during the procedure. Polyps, abnormal tissue, and some types of tumors can also be removed or treated. The ureteroscope may  also have a tool to remove tissue to be checked for disease under a microscope (biopsy). Tell a health care provider about:  Any allergies you have.  All medicines you are taking, including vitamins, herbs, eye drops, creams, and over-the-counter medicines.  Any problems you or family members have had with anesthetic medicines.  Any blood disorders you have.  Any surgeries you have had.  Any medical conditions you have.  Whether you are pregnant or may be pregnant. What are the risks? Generally, this is a safe procedure. However, problems may occur, including:  Bleeding.  Infection.  Allergic reactions to medicines.  Scarring that narrows the ureter (stricture).  Creating a hole in the ureter (perforation).  What happens before the procedure? Staying hydrated Follow instructions from your health care provider about hydration, which may include:  Up to 2 hours before the procedure - you may continue to drink clear liquids, such as water, clear fruit juice, black coffee, and plain tea.  Eating and drinking restrictions  Follow instructions from your health care provider about eating and drinking, which may include:  8 hours before the procedure - stop eating heavy meals or foods such as meat, fried foods, or fatty foods.  6 hours before the procedure - stop eating light meals or foods, such as toast or cereal.  6 hours before the procedure - stop drinking milk or drinks that contain milk.  2 hours before the procedure - stop drinking clear liquids.  Medicines  Ask your health care provider about: ? Changing or stopping your regular medicines. This is especially important if you are taking diabetes medicines or blood thinners. ? Taking medicines such as aspirin and ibuprofen. These medicines can thin your blood. Do not take these medicines before your procedure if your health care provider instructs you not to.  You may be given antibiotic medicine to help prevent  infection. General instructions  You may have a urine sample taken to check for infection.  Plan to have someone take you home from the hospital or clinic. What happens during the procedure?  To reduce your risk of infection: ? Your health care team will wash or sanitize their hands. ? Your skin will be washed with soap.  An IV tube will be inserted into one of your veins.  You will be given one of the following: ? A medicine to help you relax (sedative). ? A medicine to make you fall asleep (general anesthetic). ? A medicine that is injected into your spine to numb the area below and slightly above the injection site (spinal anesthetic).  To lower your risk of infection, you may be given an antibiotic medicine by an injection or through the IV tube.  The opening from which you urinate (urethra) will be cleaned with a germ-killing solution.  The ureteroscope will be passed through your urethra into your bladder.  A salt-water solution will flow through the ureteroscope to fill your bladder. This will help the health care provider see the openings of your ureters more clearly.  Then, the ureteroscope will be passed into your ureter. ? If a growth is found, a piece of it may be removed so it can be examined under a microscope (biopsy). ? If a stone is found, it may be removed through the ureteroscope, or the stone may be broken up using a laser, shock waves, or electrical energy. ? In some cases, if the ureter is too small, a tube may be inserted that keeps the ureter open (ureteral stent). The stent may be left in place for 1 or 2 weeks to keep the ureter open, and then the ureteroscopy procedure will be performed.  The scope will be removed, and your bladder will be emptied. The procedure may vary among health care providers and hospitals. What happens after the procedure?  Your blood pressure, heart rate, breathing rate, and blood oxygen level will be monitored until the  medicines you were given have worn off.  You may be asked to urinate.  Donot drive for 24 hours if you were given a sedative. This information is not intended to replace advice given to you by your health care provider. Make sure you discuss any questions you have with your health care provider. Document Released: 12/18/2013 Document Revised: 09/28/2016 Document Reviewed: 09/24/2016 Elsevier Interactive Patient Education  2018 [place]. Ureteral Stent Implantation, Care After Refer to this sheet in the next few weeks. These instructions provide you with information about caring for yourself after your procedure. Your health  care provider may also give you more specific instructions. Your treatment has been planned according to current medical practices, but problems sometimes occur. Call your health care provider if you have any problems or questions after your procedure. What can I expect after the procedure? After the procedure, it is common to have:  Nausea.  Mild pain when you urinate. You may feel this pain in your lower back or lower abdomen. Pain should stop within a few minutes after you urinate. This may last for up to 1 week.  A small amount of blood in your urine for several days.  Follow these instructions at home:  Medicines  Take over-the-counter and prescription medicines only as told by your health care provider.  If you were prescribed an antibiotic medicine, take it as told by your health care provider. Do not stop taking the antibiotic even if you start to feel better.  Do not drive for 24 hours if you received a sedative.  Do not drive or operate heavy machinery while taking prescription pain medicines. Activity  Return to your normal activities as told by your health care provider. Ask your health care provider what activities are safe for you.  Do not lift anything that is heavier than 10 lb (4.5 kg). Follow this limit for 1 week after your procedure, or  for as long as told by your health care provider. General instructions  Watch for any blood in your urine. Call your health care provider if the amount of blood in your urine increases.  If you have a catheter: ? Follow instructions from your health care provider about taking care of your catheter and collection bag. ? Do not take baths, swim, or use a hot tub until your health care provider approves.  Drink enough fluid to keep your urine clear or pale yellow.  Keep all follow-up visits as told by your health care provider. This is important. Contact a health care provider if:  You have pain that gets worse or does not get better with medicine, especially pain when you urinate.  You have difficulty urinating.  You feel nauseous or you vomit repeatedly during a period of more than 2 days after the procedure. Get help right away if:  Your urine is dark red or has blood clots in it.  You are leaking urine (have incontinence).  AMBULATORY SURGERY  DISCHARGE INSTRUCTIONS   1) The drugs that you were given will stay in your system until tomorrow so for the next 24 hours you should not:  A) Drive an automobile B) Make any legal decisions C) Drink any alcoholic beverage   2) You may resume regular meals tomorrow.  Today it is better to start with liquids and gradually work up to solid foods.  You may eat anything you prefer, but it is better to start with liquids, then soup and crackers, and gradually work up to solid foods.   3) Please notify your doctor immediately if you have any unusual bleeding, trouble breathing, redness and pain at the surgery site, drainage, fever, or pain not relieved by medication.    4) Additional Instructions:        Please contact your physician with any problems or Same Day Surgery at [phone], Monday through Friday 6 am to 4 pm, or [place] at [facility] number at [phone].  The end of the stent comes out of your  urethra.  You cannot urinate.  You have sudden, sharp, or severe pain in your  abdomen or lower back.  You have a fever. This information is not intended to replace advice given to you by your health care provider. Make sure you discuss any questions you have with your health care provider. Document Released: 08/15/2013 Document Revised: 05/20/2016 Document Reviewed: 06/27/2015 Elsevier Interactive Patient Education  [employer].

## 2017-08-09 NOTE — Anesthesia Preprocedure Evaluation (Signed)
Anesthesia Evaluation  Patient identified by MRN, date of birth, ID band Patient awake    Reviewed: Allergy & Precautions, H&P , NPO status , Patient's Chart, lab work & pertinent test results, reviewed documented beta blocker date and time   History of Anesthesia Complications Negative for: history of anesthetic complications  Airway Mallampati: I  TM Distance: >3 FB Neck ROM: full    Dental  (+) Dental Advidsory Given, Teeth Intact   Pulmonary neg pulmonary ROS, former smoker,           Cardiovascular Exercise Tolerance: Good negative cardio ROS       Neuro/Psych negative neurological ROS  negative psych ROS   GI/Hepatic negative GI ROS, Neg liver ROS,   Endo/Other  diabetes (diet controlled, no longer on meds), Well Controlled  Renal/GU Renal disease (kidney stones)  negative genitourinary   Musculoskeletal   Abdominal   Peds  Hematology negative hematology ROS (+)   Anesthesia Other Findings Past Medical History: No date: Adopted No date: Diabetes mellitus     Comment:  PT WAS TAKING METFORMIN BUT PCP TOOK PT OFF DUE TO MOST               RECENT AIC OF 6.4 ON 07-2017 No date: Dyslipidemia No date: Erectile dysfunction 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     Comment:  Upper Lip No date: Hyperlipidemia No date: Male hypogonadism No date: Prostate cancer ([place]) No date: Renal stones No date: Thyroid nodule     Comment:  Right   Reproductive/Obstetrics negative OB ROS                             Anesthesia Physical Anesthesia Plan  ASA: II  Anesthesia Plan: General   Post-op Pain Management:    Induction: Intravenous  PONV Risk Score and Plan: 2 and Ondansetron and Dexamethasone  Airway Management Planned: LMA  Additional Equipment:   Intra-op Plan:   Post-operative Plan: Extubation in OR  Informed Consent: I have reviewed the patients History and Physical, chart, labs and  discussed the procedure including the risks, benefits and alternatives for the proposed anesthesia with the patient or authorized representative who has indicated his/her understanding and acceptance.   Dental Advisory Given  Plan Discussed with: Anesthesiologist, CRNA and Surgeon  Anesthesia Plan Comments:        Anesthesia Quick Evaluation

## 2017-08-09 NOTE — Anesthesia Post-op Follow-up Note (Signed)
Anesthesia QCDR form completed.        

## 2017-08-09 NOTE — H&P (Signed)
Date of Initial H&P: 08/01/17  History reviewed, patient examined, no change in status, stable for surgery.

## 2017-08-09 NOTE — Anesthesia Procedure Notes (Signed)
Procedure Name: LMA Insertion Date/Time: 08/09/2017 1:11 PM Performed by: Silvana Newness Pre-anesthesia Checklist: Patient identified, Emergency Drugs available, Suction available, Patient being monitored and Timeout performed Patient Re-evaluated:Patient Re-evaluated prior to induction Oxygen Delivery Method: Circle system utilized Preoxygenation: Pre-oxygenation with 100% oxygen Induction Type: IV induction Ventilation: Mask ventilation without difficulty LMA: LMA inserted LMA Size: 5.0 Number of attempts: 1 Dental Injury: Teeth and Oropharynx as per pre-operative assessment

## 2017-08-12 NOTE — Anesthesia Postprocedure Evaluation (Signed)
Anesthesia Post Note  Patient: LOTHAR PREHN  Procedure(s) Performed: Procedure(s) (LRB): URETEROSCOPY WITH HOLMIUM LASER LITHOTRIPSY (Right) CYSTOSCOPY WITH RETROGRADE PYELOGRAM/URETERAL Gay PLACEMENT  Patient location during evaluation: PACU Anesthesia Type: General Level of consciousness: awake and alert Pain management: pain level controlled Vital Signs Assessment: post-procedure vital signs reviewed and stable Respiratory status: spontaneous breathing, nonlabored ventilation, respiratory function stable and patient connected to nasal cannula oxygen Cardiovascular status: blood pressure returned to baseline and stable Postop Assessment: no signs of nausea or vomiting Anesthetic complications: no     Last Vitals:  Vitals:   08/09/17 1755 08/09/17 1830  BP: (!) 145/74 (!) 150/76  Pulse:    Resp:    Temp: 37.2 C   SpO2: 100%     Last Pain:  Vitals:   08/10/17 0930  TempSrc:   PainSc: 1                  Martha Clan

## 2017-09-13 ENCOUNTER — Encounter: Payer: Self-pay | Admitting: *Deleted

## 2017-09-15 ENCOUNTER — Encounter: Payer: Self-pay | Admitting: Anesthesiology

## 2017-09-15 ENCOUNTER — Ambulatory Visit
Admission: RE | Admit: 2017-09-15 | Discharge: 2017-09-15 | Disposition: A | Discharge status: Home / Self Care | Payer: Medicare Other | Source: Ambulatory Visit | Attending: Urology | Admitting: Urology

## 2017-09-15 ENCOUNTER — Encounter: Payer: Self-pay | Admitting: *Deleted

## 2017-09-15 ENCOUNTER — Encounter
Admission: RE | Disposition: A | Discharge status: Home / Self Care | Payer: Self-pay | Source: Ambulatory Visit | Attending: Urology

## 2017-09-15 DIAGNOSIS — E119 Type 2 diabetes mellitus without complications: Secondary | ICD-10-CM | POA: Diagnosis not present

## 2017-09-15 DIAGNOSIS — E7800 Pure hypercholesterolemia, unspecified: Secondary | ICD-10-CM | POA: Diagnosis not present

## 2017-09-15 DIAGNOSIS — Z7982 Long term (current) use of aspirin: Secondary | ICD-10-CM | POA: Insufficient documentation

## 2017-09-15 DIAGNOSIS — Z8546 Personal history of malignant neoplasm of prostate: Secondary | ICD-10-CM | POA: Insufficient documentation

## 2017-09-15 DIAGNOSIS — I2510 Atherosclerotic heart disease of native coronary artery without angina pectoris: Secondary | ICD-10-CM | POA: Insufficient documentation

## 2017-09-15 DIAGNOSIS — N200 Calculus of kidney: Secondary | ICD-10-CM | POA: Diagnosis not present

## 2017-09-15 DIAGNOSIS — Z87891 Personal history of nicotine dependence: Secondary | ICD-10-CM | POA: Insufficient documentation

## 2017-09-15 DIAGNOSIS — Z87442 Personal history of urinary calculi: Secondary | ICD-10-CM | POA: Insufficient documentation

## 2017-09-15 DIAGNOSIS — Z79899 Other long term (current) drug therapy: Secondary | ICD-10-CM | POA: Insufficient documentation

## 2017-09-15 HISTORY — PX: EXTRACORPOREAL SHOCK WAVE LITHOTRIPSY: SHX1557

## 2017-09-15 LAB — GLUCOSE, CAPILLARY: GLUCOSE-CAPILLARY: 94 mg/dL (ref 65–99)

## 2017-09-15 SURGERY — LITHOTRIPSY, ESWL
Anesthesia: Moderate Sedation | Laterality: Left

## 2017-09-15 MED ORDER — DIPHENHYDRAMINE HCL 25 MG PO CAPS
ORAL_CAPSULE | ORAL | Status: AC
  Administered 2017-09-15: 25 mg via ORAL
  Filled 2017-09-15: qty 1

## 2017-09-15 MED ORDER — LEVOFLOXACIN 500 MG PO TABS
ORAL_TABLET | ORAL | Status: AC
  Administered 2017-09-15: 500 mg via ORAL
  Filled 2017-09-15: qty 1

## 2017-09-15 MED ORDER — LEVOFLOXACIN 500 MG PO TABS: 500.0000 mg | ORAL_TABLET | Freq: Every day | ORAL | 1 refills | Status: DC

## 2017-09-15 MED ORDER — MIDAZOLAM HCL 2 MG/2ML IJ SOLN
1.0000 mg | Freq: Once | INTRAMUSCULAR | Status: AC
  Administered 2017-09-15: 1 mg via INTRAMUSCULAR

## 2017-09-15 MED ORDER — MIDAZOLAM HCL 2 MG/2ML IJ SOLN
INTRAMUSCULAR | Status: AC
  Administered 2017-09-15: 1 mg via INTRAMUSCULAR
  Filled 2017-09-15: qty 2

## 2017-09-15 MED ORDER — MORPHINE SULFATE (PF) 10 MG/ML IV SOLN
INTRAVENOUS | Status: AC
  Administered 2017-09-15: 10 mg via INTRAMUSCULAR
  Filled 2017-09-15: qty 1

## 2017-09-15 MED ORDER — MORPHINE SULFATE (PF) 10 MG/ML IV SOLN
10.0000 mg | Freq: Once | INTRAVENOUS | Status: AC
  Administered 2017-09-15: 10 mg via INTRAMUSCULAR

## 2017-09-15 MED ORDER — PROMETHAZINE HCL 25 MG/ML IJ SOLN: 6.2500 mg | INTRAMUSCULAR | Status: DC | PRN

## 2017-09-15 MED ORDER — OXYCODONE HCL 5 MG/5ML PO SOLN: 5.0000 mg | Freq: Once | ORAL | Status: DC | PRN

## 2017-09-15 MED ORDER — DIPHENHYDRAMINE HCL 25 MG PO CAPS
25.0000 mg | ORAL_CAPSULE | ORAL | Status: AC
  Administered 2017-09-15: 25 mg via ORAL

## 2017-09-15 MED ORDER — FUROSEMIDE 10 MG/ML IJ SOLN
INTRAMUSCULAR | Status: AC
  Filled 2017-09-15: qty 2

## 2017-09-15 MED ORDER — FUROSEMIDE 10 MG/ML IJ SOLN: 10.0000 mg | Freq: Once | INTRAMUSCULAR | Status: DC

## 2017-09-15 MED ORDER — ONDANSETRON 8 MG PO TBDP: 4.0000 mg | ORAL_TABLET | Freq: Four times a day (QID) | ORAL | 3 refills | Status: DC | PRN

## 2017-09-15 MED ORDER — LEVOFLOXACIN 500 MG PO TABS
500.0000 mg | ORAL_TABLET | ORAL | Status: AC
  Administered 2017-09-15: 500 mg via ORAL

## 2017-09-15 MED ORDER — OXYCODONE HCL 5 MG PO TABS: 5.0000 mg | ORAL_TABLET | Freq: Once | ORAL | Status: DC | PRN

## 2017-09-15 MED ORDER — DEXTROSE-NACL 5-0.45 % IV SOLN
INTRAVENOUS | Status: DC
  Administered 2017-09-15: 12:00:00 via INTRAVENOUS

## 2017-09-15 MED ORDER — PROMETHAZINE HCL 25 MG/ML IJ SOLN
INTRAMUSCULAR | Status: AC
  Administered 2017-09-15: 25 mg via INTRAMUSCULAR
  Filled 2017-09-15: qty 1

## 2017-09-15 MED ORDER — NUCYNTA 50 MG PO TABS: 50.0000 mg | ORAL_TABLET | Freq: Four times a day (QID) | ORAL | 0 refills | Status: DC | PRN

## 2017-09-15 MED ORDER — PROMETHAZINE HCL 25 MG/ML IJ SOLN
25.0000 mg | Freq: Once | INTRAMUSCULAR | Status: AC
  Administered 2017-09-15: 25 mg via INTRAMUSCULAR

## 2017-09-15 NOTE — Discharge Instructions (Signed)
Kidney Stones °Kidney stones (urolithiasis) are solid, rock-like deposits that form inside of the organs that make urine (kidneys). A kidney stone may form in a kidney and move into the bladder, where it can cause intense pain and block the flow of urine. Kidney stones are created when high levels of certain minerals are found in the urine. They are usually passed through urination, but in some cases, medical treatment may be needed to remove them. °What are the causes? °Kidney stones may be caused by: °· A condition in which certain glands produce too much parathyroid hormone (primary hyperparathyroidism), which causes too much calcium buildup in the blood. °· Buildup of uric acid crystals in the bladder (hyperuricosuria). Uric acid is a chemical that the body produces when you eat certain foods. It usually exits the body in the urine. °· Narrowing (stricture) of one or both of the tubes that drain urine from the kidneys to the bladder (ureters). °· A kidney blockage that is present at birth (congenital obstruction). °· Past surgery on the kidney or the ureters, such as gastric bypass surgery. ° °What increases the risk? °The following factors make you more likely to develop kidney stones: °· Having had a kidney stone in the past. °· Having a family history of kidney stones. °· Not drinking enough water. °· Eating a diet that is high in protein, salt (sodium), or sugar. °· Being overweight or obese. ° °What are the signs or symptoms? °Symptoms of a kidney stone may include: °· Nausea. °· Vomiting. °· Blood in the urine (hematuria). °· Pain in the side of the abdomen, right below the ribs (flank pain). Pain usually spreads (radiates) to the groin. °· Needing to urinate frequently or urgently. ° °How is this diagnosed? °This condition may be diagnosed based on: °· Your medical history. °· A physical exam. °· Blood tests. °· Urine tests. °· CT scan. °· Abdominal X-ray. °· A procedure to examine the inside of the  bladder (cystoscopy). ° °How is this treated? °Treatment for kidney stones depends on the size, location, and makeup of the stones. Treatment may involve: °· Analyzing your urine before and after you pass the stone through urination. °· Being monitored at the hospital until you pass the stone through urination. °· Increasing your fluid intake and decreasing the amount of calcium and protein in your diet. °· A procedure to break up kidney stones in the bladder using: °? A focused beam of light (laser therapy). °? Shock waves (extracorporeal shock wave lithotripsy). °· Surgery to remove kidney stones. This may be needed if you have severe pain or have stones that block your urinary tract. ° °Follow these instructions at home: °Eating and drinking ° °· Drink enough fluid to keep your urine clear or pale yellow. This will help you to pass the kidney stone. °· If directed, change your diet. This may include: °? Limiting how much sodium you eat. °? Eating more fruits and vegetables. °? Limiting how much meat, poultry, fish, and eggs you eat. °· Follow instructions from your health care provider about eating or drinking restrictions. °General instructions °· Collect urine samples as told by your health care provider. You may need to collect a urine sample: °? 24 hours after you pass the stone. °? 8-12 weeks after passing the kidney stone, and every 6-12 months after that. °· Strain your urine every time you urinate, for as long as directed. Use the strainer that your health care provider recommends. °· Do not throw out   the kidney stone after passing it. Keep the stone so it can be tested by your health care provider. Testing the makeup of your kidney stone may help prevent you from getting kidney stones in the future. °· Take over-the-counter and prescription medicines only as told by your health care provider. °· Keep all follow-up visits as told by your health care provider. This is important. You may need follow-up  X-rays or ultrasounds to make sure that your stone has passed. °How is this prevented? °To prevent another kidney stone: °· Drink enough fluid to keep your urine clear or pale yellow. This is the best way to prevent kidney stones. °· Eat a healthy diet and follow recommendations from your health care provider about foods to avoid. You may be instructed to eat a low-protein diet. Recommendations vary depending on the type of kidney stone that you have. °· Maintain a healthy weight. ° °Contact a health care provider if: °· You have pain that gets worse or does not get better with medicine. °Get help right away if: °· You have a fever or chills. °· You develop severe pain. °· You develop new abdominal pain. °· You faint. °· You are unable to urinate. °This information is not intended to replace advice given to you by your health care provider. Make sure you discuss any questions you have with your health care provider. °Document Released: 12/13/2005 Document Revised: 07/02/2016 Document Reviewed: 05/28/2016 °Elsevier Interactive Patient Education © 2017 [employer] ° ° °Lithotripsy, Care After °This sheet gives you information about how to care for yourself after your procedure. Your health care provider may also give you more specific instructions. If you have problems or questions, contact your health care provider. °What can I expect after the procedure? °After the procedure, it is common to have: °· Some blood in your urine. This should only last for a few days. °· Soreness in your back, sides, or upper abdomen for a few days. °· Blotches or bruises on your back where the pressure wave entered the skin. °· Pain, discomfort, or nausea when pieces (fragments) of the kidney stone move through the tube that carries urine from the kidney to the bladder (ureter). Stone fragments may pass soon after the procedure, but they may continue to pass for up to 4-8 weeks. °? If you have severe pain or nausea, contact your  health care provider. This may be caused by a large stone that was not broken up, and this may mean that you need more treatment. °· Some pain or discomfort during urination. °· Some pain or discomfort in the lower abdomen or (in men) at the base of the penis. ° °Follow these instructions at home: °Medicines °· Take over-the-counter and prescription medicines only as told by your health care provider. °· If you were prescribed an antibiotic medicine, take it as told by your health care provider. Do not stop taking the antibiotic even if you start to feel better. °· Do not drive for 24 hours if you were given a medicine to help you relax (sedative). °· Do not drive or use heavy machinery while taking prescription pain medicine. °Eating and drinking °· Drink enough water and fluids to keep your urine clear or pale yellow. This helps any remaining pieces of the stone to pass. It can also help prevent new stones from forming. °· Eat plenty of fresh fruits and vegetables. °· Follow instructions from your health care provider about eating and drinking restrictions. You may be instructed: °?   To reduce how much salt (sodium) you eat or drink. Check ingredients and nutrition facts on packaged foods and beverages. °? To reduce how much meat you eat. °· Eat the recommended amount of calcium for your age and gender. Ask your health care provider how much calcium you should have. °General instructions °· Get plenty of rest. °· Most people can resume normal activities 1-2 days after the procedure. Ask your health care provider what activities are safe for you. °· If directed, strain all urine through the strainer that was provided by your health care provider. °? Keep all fragments for your health care provider to see. Any stones that are found may be sent to a medical lab for examination. The stone may be as small as a grain of salt. °· Keep all follow-up visits as told by your health care provider. This is important. °Contact a  health care provider if: °· You have pain that is severe or does not get better with medicine. °· You have nausea that is severe or does not go away. °· You have blood in your urine longer than your health care provider told you to expect. °· You have more blood in your urine. °· You have pain during urination that does not go away. °· You urinate more frequently than usual and this does not go away. °· You develop a rash or any other possible signs of an allergic reaction. °Get help right away if: °· You have severe pain in your back, sides, or upper abdomen. °· You have severe pain while urinating. °· Your urine is very dark red. °· You have blood in your stool (feces). °· You cannot pass any urine at all. °· You feel a strong urge to urinate after emptying your bladder. °· You have a fever or chills. °· You develop shortness of breath, difficulty breathing, or chest pain. °· You have severe nausea that leads to persistent vomiting. °· You faint. °Summary °· After this procedure, it is common to have some pain, discomfort, or nausea when pieces (fragments) of the kidney stone move through the tube that carries urine from the kidney to the bladder (ureter). If this pain or nausea is severe, however, you should contact your health care provider. °· Most people can resume normal activities 1-2 days after the procedure. Ask your health care provider what activities are safe for you. °· Drink enough water and fluids to keep your urine clear or pale yellow. This helps any remaining pieces of the stone to pass, and it can help prevent new stones from forming. °· If directed, strain your urine and keep all fragments for your health care provider to see. Fragments or stones may be as small as a grain of salt. °· Get help right away if you have severe pain in your back, sides, or upper abdomen or have severe pain while urinating. °This information is not intended to replace advice given to you by your health care provider.  Make sure you discuss any questions you have with your health care provider. °Document Released: 01/02/2008 Document Revised: 11/03/2016 Document Reviewed: 11/03/2016 °Elsevier Interactive Patient Education © 2017 [employer] ° ° °Lithotripsy °Lithotripsy is a treatment that can sometimes help eliminate kidney stones and the pain that they cause. A form of lithotripsy, also known as extracorporeal shock wave lithotripsy, is a nonsurgical procedure that crushes a kidney stone with shock waves. These shock waves pass through your body and focus on the kidney stone. They cause the   kidney stone to break up while it is still in the urinary tract. This makes it easier for the smaller pieces of stone to pass in the urine. °Tell a health care provider about: °· Any allergies you have. °· All medicines you are taking, including vitamins, herbs, eye drops, creams, and over-the-counter medicines. °· Any blood disorders you have. °· Any surgeries you have had. °· Any medical conditions you have. °· Whether you are pregnant or may be pregnant. °· Any problems you or family members have had with anesthetic medicines. °What are the risks? °Generally, this is a safe procedure. However, problems may occur, including: °· Infection. °· Bleeding of the kidney. °· Bruising of the kidney or skin. °· Scarring of the kidney, which can lead to: °? Increased blood pressure. °? Poor kidney function. °? Return (recurrence) of kidney stones. °· Damage to other structures or organs, such as the liver, colon, spleen, or pancreas. °· Blockage (obstruction) of the the tube that carries urine from the kidney to the bladder (ureter). °· Failure of the kidney stone to break into pieces (fragments). ° °What happens before the procedure? °Staying hydrated °Follow instructions from your health care provider about hydration, which may include: °· Up to 2 hours before the procedure - you may continue to drink clear liquids, such as water, clear fruit  juice, black coffee, and plain tea. ° °Eating and drinking restrictions °Follow instructions from your health care provider about eating and drinking, which may include: °· 8 hours before the procedure - stop eating heavy meals or foods such as meat, fried foods, or fatty foods. °· 6 hours before the procedure - stop eating light meals or foods, such as toast or cereal. °· 6 hours before the procedure - stop drinking milk or drinks that contain milk. °· 2 hours before the procedure - stop drinking clear liquids. ° °General instructions °· Plan to have someone take you home from the hospital or clinic. °· Ask your health care provider about: °? Changing or stopping your regular medicines. This is especially important if you are taking diabetes medicines or blood thinners. °? Taking medicines such as aspirin and ibuprofen. These medicines and other NSAIDs can thin your blood. Do not take these medicines for 7 days before your procedure if your health care provider instructs you not to. °· You may have tests, such as: °? Blood tests. °? Urine tests. °? Imaging tests, such as a CT scan. °What happens during the procedure? °· To lower your risk of infection: °? Your health care team will wash or sanitize their hands. °? Your skin will be washed with soap. °· An IV tube will be inserted into one of your veins. This tube will give you fluids and medicines. °· You will be given one or more of the following: °? A medicine to help you relax (sedative). °? A medicine to make you fall asleep (general anesthetic). °· A water-filled cushion may be placed behind your kidney or on your abdomen. In some cases you may be placed in a tub of lukewarm water. °· Your body will be positioned in a way that makes it easy to target the kidney stone. °· A flexible tube with holes in it (stent) may be placed in the ureter. This will help keep urine flowing from the kidney if the fragments of the stone have been blocking the ureter. °· An X-ray  or ultrasound exam will be done to locate your stone. °· Shock waves will be   aimed at the stone. If you are awake, you may feel a tapping sensation as the shock waves pass through your body. °The procedure may vary among health care providers and hospitals. °What happens after the procedure? °· You may have an X-ray to see whether the procedure was able to break up the kidney stone and how much of the stone has passed. If large stone fragments remain after treatment, you may need to have a second procedure at a later time. °· Your blood pressure, heart rate, breathing rate, and blood oxygen level will be monitored until the medicines you were given have worn off. °· You may be given antibiotics or pain medicine as needed. °· If a stent was placed in your ureter during surgery, it may stay in place for a few weeks. °· You may need strain your urine to collect pieces of the kidney stone for testing. °· You will need to drink plenty of water. °· Do not drive for 24 hours if you were given a sedative. °Summary °· Lithotripsy is a treatment that can sometimes help eliminate kidney stones and the pain that they cause. °· A form of lithotripsy, also known as extracorporeal shock wave lithotripsy, is a nonsurgical procedure that crushes a kidney stone with shock waves. °· Generally, this is a safe procedure. However, problems may occur, including damage to the kidney or other organs, infection, or obstruction of the tube that carries urine from the kidney to the bladder (ureter). °· When you go home, you will need to drink plenty of water. You may be asked to strain your urine to collect pieces of the kidney stone for testing. °This information is not intended to replace advice given to you by your health care provider. Make sure you discuss any questions you have with your health care provider. °Document Released: 12/10/2000 Document Revised: 11/03/2016 Document Reviewed: 11/03/2016 °Elsevier Interactive Patient Education  © 2017 [employer] ° °

## 2017-10-13 ENCOUNTER — Ambulatory Visit (INDEPENDENT_AMBULATORY_CARE_PROVIDER_SITE_OTHER): Payer: Medicare Other

## 2017-10-13 DIAGNOSIS — Z23 Encounter for immunization: Secondary | ICD-10-CM

## 2017-10-26 ENCOUNTER — Telehealth: Payer: Self-pay | Admitting: Family Medicine

## 2017-10-26 MED ORDER — ACYCLOVIR 800 MG PO TABS: 800.0000 mg | ORAL_TABLET | Freq: Every day | ORAL | 2 refills | Status: DC

## 2017-10-26 NOTE — Telephone Encounter (Signed)
Patient notified by telephone that script has been sent to the pharmacy per Dr. Duncan. 

## 2017-10-26 NOTE — Telephone Encounter (Signed)
Copied from [facility] #[id number]. Topic: Inquiry >> Oct 25, 2017 12:08 PM Pricilla Handler wrote: Reason for CRM: Patient has called requesting a refill of Acyclovir ASAP. Patient stated that he is completely out. Please call this patient ASAP today. Patient's phone number is [phone]. Thank You!!!

## 2017-10-26 NOTE — Telephone Encounter (Signed)
Sent. Thanks.   

## 2017-11-01 ENCOUNTER — Other Ambulatory Visit: Payer: Medicare Other

## 2017-11-04 ENCOUNTER — Ambulatory Visit: Payer: Medicare Other | Admitting: Family Medicine

## 2017-11-08 ENCOUNTER — Other Ambulatory Visit (INDEPENDENT_AMBULATORY_CARE_PROVIDER_SITE_OTHER): Payer: Medicare Other

## 2017-11-08 ENCOUNTER — Ambulatory Visit: Payer: Medicare Other | Admitting: Family Medicine

## 2017-11-08 DIAGNOSIS — Z8639 Personal history of other endocrine, nutritional and metabolic disease: Secondary | ICD-10-CM | POA: Diagnosis not present

## 2017-11-08 LAB — HEMOGLOBIN A1C: Hgb A1c MFr Bld: 6.2 % (ref 4.6–6.5)

## 2017-11-09 ENCOUNTER — Other Ambulatory Visit: Payer: Self-pay | Admitting: Family Medicine

## 2017-11-10 LAB — HM DIABETES EYE EXAM

## 2017-11-11 ENCOUNTER — Ambulatory Visit (INDEPENDENT_AMBULATORY_CARE_PROVIDER_SITE_OTHER): Payer: Medicare Other | Admitting: Family Medicine

## 2017-11-11 ENCOUNTER — Encounter: Payer: Self-pay | Admitting: Family Medicine

## 2017-11-11 VITALS — BP 120/72 | HR 87 | Temp 98.8°F | Wt 223.8 lb

## 2017-11-11 DIAGNOSIS — Z8639 Personal history of other endocrine, nutritional and metabolic disease: Secondary | ICD-10-CM

## 2017-11-11 MED ORDER — ROSUVASTATIN CALCIUM 20 MG PO TABS: 20.0000 mg | ORAL_TABLET | ORAL | Status: DC

## 2017-11-11 MED ORDER — RED YEAST RICE 600 MG PO TABS: 2.0000 | ORAL_TABLET | ORAL | Status: DC

## 2017-11-11 NOTE — Progress Notes (Signed)
D/w pt about statin use along with RYR.  He was alternating the two and able to tolerate that.  Med list updated.  Diabetes:  Using medications without difficulties:yes, still on metformin 500mg  a day.  Hypoglycemic episodes:no sx Hyperglycemic episodes:no sx Feet problems: no tingling.   Blood Sugars averaging: not checked at home.  eye exam within last year: done yesterday.  No retinopathy per patient report.  Dr. Katy Fitch.    He notes L foot itching only after public speaking.    He had f/u with urology.  I'll defer.  D/w pt.  He has f/u with urology pending.  He noted dec in muscle mass/bench press with lupron injection.  He'll talk to urology.    PMH and SH reviewed  Meds, vitals, and allergies reviewed.   ROS: Per HPI unless specifically indicated in ROS section   GEN: nad, alert and oriented HEENT: mucous membranes moist NECK: supple w/o LA CV: rrr. PULM: ctab, no inc wob ABD: soft, +bs EXT: no edema  Diabetic foot exam: Normal inspection No skin breakdown No calluses  Normal DP pulses Normal sensation to light touch and monofilament Nails normal

## 2017-11-11 NOTE — Patient Instructions (Addendum)
Recheck in about 3 months.  Okay to stop the metformin in the meantime.   Labs prior to a visit.   Take care.  Glad to see you.  Thanks for your effort.

## 2017-11-13 NOTE — Assessment & Plan Note (Signed)
Discussed with patient about options.  Reasonable to stop metformin.  Rationale discussed with patient, to avoid low sugars. Recheck in about 3 months.  Okay to stop the metformin in the meantime.  Continue work on diet and exercise. Labs prior to a visit.  He agrees.  >25 minutes spent in face to face time with patient, >50% spent in counselling or coordination of care.

## 2018-02-07 ENCOUNTER — Other Ambulatory Visit: Payer: Medicare Other

## 2018-02-08 ENCOUNTER — Other Ambulatory Visit: Payer: Medicare Other

## 2018-02-09 ENCOUNTER — Other Ambulatory Visit (INDEPENDENT_AMBULATORY_CARE_PROVIDER_SITE_OTHER): Payer: Medicare Other

## 2018-02-09 DIAGNOSIS — Z8639 Personal history of other endocrine, nutritional and metabolic disease: Secondary | ICD-10-CM

## 2018-02-09 LAB — MICROALBUMIN / CREATININE URINE RATIO
CREATININE, U: 230.1 mg/dL
Microalb Creat Ratio: 6.3 mg/g (ref 0.0–30.0)
Microalb, Ur: 14.6 mg/dL — ABNORMAL HIGH (ref 0.0–1.9)

## 2018-02-09 LAB — HEMOGLOBIN A1C: HEMOGLOBIN A1C: 6.4 % (ref 4.6–6.5)

## 2018-02-14 ENCOUNTER — Encounter: Payer: Self-pay | Admitting: Family Medicine

## 2018-02-14 ENCOUNTER — Ambulatory Visit (INDEPENDENT_AMBULATORY_CARE_PROVIDER_SITE_OTHER): Payer: Medicare Other | Admitting: Family Medicine

## 2018-02-14 VITALS — BP 124/70 | HR 68 | Temp 98.1°F | Wt 226.0 lb

## 2018-02-14 DIAGNOSIS — Z1211 Encounter for screening for malignant neoplasm of colon: Secondary | ICD-10-CM

## 2018-02-14 DIAGNOSIS — Z8639 Personal history of other endocrine, nutritional and metabolic disease: Secondary | ICD-10-CM | POA: Diagnosis not present

## 2018-02-14 MED ORDER — LISINOPRIL 2.5 MG PO TABS
2.5000 mg | ORAL_TABLET | Freq: Every day | ORAL | 3 refills | Status: DC
Start: 2018-02-14 — End: 2018-08-25

## 2018-02-14 NOTE — Patient Instructions (Addendum)
Call about your next appointment with urology.  Rosaria Ferries will call about your referral. Take care.  Glad to see you.  Start taking lisinopril 2.5mg  a day.  Update me as needed.  Recheck in May, labs ahead of time.  Keep working on diet and exercise in the meantime.

## 2018-02-14 NOTE — Assessment & Plan Note (Signed)
Refer.  He agrees.  No FH available.  No prev colon CA screening.

## 2018-02-14 NOTE — Progress Notes (Signed)
Diabetes:  Using medications without difficulties: yes Hypoglycemic episodes: no sx Hyperglycemic episodes: no sx Feet problems: no Blood Sugars averaging: not checked.   eye exam within last year:yes MALB up, d/w pt.   He got worried about his sugar and got back on metformin in the meantime.  A1c 6.4.  D/w pt.    D/w patient [id number] for colon cancer screening, including IFOB vs. colonoscopy.  Risks and benefits of both were discussed and patient voiced understanding.  Pt elects [id number].    See avs.   Meds, vitals, and allergies reviewed.   ROS: Per HPI unless specifically indicated in ROS section   GEN: nad, alert and oriented HEENT: mucous membranes moist NECK: supple w/o LA CV: rrr. PULM: ctab, no inc wob ABD: soft, +bs EXT: no edema SKIN: no acute rash

## 2018-02-14 NOTE — Assessment & Plan Note (Signed)
He may not be diabetic but felt better about his situation with taking the med.  Possibly borderline DM2.  No ADE on med.  Continue as is with metformin.  Recheck in about 3 months.  He agrees.  Start ACE given MALB results.  Path/phys and ACE cautions (angioedema, cough, nsaids caution, etc d/w pt).  He agrees.  Labs d/w pt. >25 minutes spent in face to face time with patient, >50% spent in counselling or coordination of care.  MEDICATIONS this encounter: Meds ordered this encounter  Medications  . lisinopril (ZESTRIL) 2.5 MG tablet    Sig: Take 1 tablet (2.5 mg total) by mouth daily.    Dispense:  90 tablet    Refill:  3

## 2018-02-17 ENCOUNTER — Other Ambulatory Visit: Payer: Self-pay

## 2018-02-17 DIAGNOSIS — Z1211 Encounter for screening for malignant neoplasm of colon: Secondary | ICD-10-CM

## 2018-03-09 ENCOUNTER — Encounter
Admission: RE | Disposition: A | Discharge status: Home / Self Care | Payer: Self-pay | Source: Ambulatory Visit | Attending: Gastroenterology

## 2018-03-09 ENCOUNTER — Ambulatory Visit: Payer: Medicare Other | Admitting: Certified Registered Nurse Anesthetist

## 2018-03-09 ENCOUNTER — Encounter: Payer: Self-pay | Admitting: Anesthesiology

## 2018-03-09 ENCOUNTER — Ambulatory Visit
Admission: RE | Admit: 2018-03-09 | Discharge: 2018-03-09 | Disposition: A | Discharge status: Home / Self Care | Payer: Medicare Other | Source: Ambulatory Visit | Attending: Gastroenterology | Admitting: Gastroenterology

## 2018-03-09 DIAGNOSIS — D122 Benign neoplasm of ascending colon: Secondary | ICD-10-CM | POA: Insufficient documentation

## 2018-03-09 DIAGNOSIS — K5730 Diverticulosis of large intestine without perforation or abscess without bleeding: Secondary | ICD-10-CM | POA: Diagnosis not present

## 2018-03-09 DIAGNOSIS — Z87442 Personal history of urinary calculi: Secondary | ICD-10-CM | POA: Insufficient documentation

## 2018-03-09 DIAGNOSIS — Z1211 Encounter for screening for malignant neoplasm of colon: Secondary | ICD-10-CM | POA: Diagnosis present

## 2018-03-09 DIAGNOSIS — Z79899 Other long term (current) drug therapy: Secondary | ICD-10-CM | POA: Diagnosis not present

## 2018-03-09 DIAGNOSIS — D120 Benign neoplasm of cecum: Secondary | ICD-10-CM | POA: Insufficient documentation

## 2018-03-09 DIAGNOSIS — Z87891 Personal history of nicotine dependence: Secondary | ICD-10-CM | POA: Insufficient documentation

## 2018-03-09 DIAGNOSIS — Z7984 Long term (current) use of oral hypoglycemic drugs: Secondary | ICD-10-CM | POA: Insufficient documentation

## 2018-03-09 DIAGNOSIS — E119 Type 2 diabetes mellitus without complications: Secondary | ICD-10-CM | POA: Diagnosis not present

## 2018-03-09 DIAGNOSIS — D123 Benign neoplasm of transverse colon: Secondary | ICD-10-CM | POA: Diagnosis not present

## 2018-03-09 DIAGNOSIS — Z8546 Personal history of malignant neoplasm of prostate: Secondary | ICD-10-CM | POA: Diagnosis not present

## 2018-03-09 DIAGNOSIS — E785 Hyperlipidemia, unspecified: Secondary | ICD-10-CM | POA: Diagnosis not present

## 2018-03-09 HISTORY — PX: COLONOSCOPY WITH PROPOFOL: SHX5780

## 2018-03-09 LAB — GLUCOSE, CAPILLARY: Glucose-Capillary: 89 mg/dL (ref 65–99)

## 2018-03-09 SURGERY — COLONOSCOPY WITH PROPOFOL
Anesthesia: General

## 2018-03-09 MED ORDER — PROPOFOL 500 MG/50ML IV EMUL
INTRAVENOUS | Status: DC | PRN
  Administered 2018-03-09: 160 ug/kg/min via INTRAVENOUS

## 2018-03-09 MED ORDER — PROPOFOL 500 MG/50ML IV EMUL
INTRAVENOUS | Status: AC
  Filled 2018-03-09: qty 50

## 2018-03-09 MED ORDER — PROPOFOL 10 MG/ML IV BOLUS
INTRAVENOUS | Status: AC
  Filled 2018-03-09: qty 20

## 2018-03-09 MED ORDER — SODIUM CHLORIDE 0.9 % IV SOLN
INTRAVENOUS | Status: DC
  Administered 2018-03-09: 1000 mL via INTRAVENOUS

## 2018-03-09 MED ORDER — PROPOFOL 10 MG/ML IV BOLUS
INTRAVENOUS | Status: DC | PRN
  Administered 2018-03-09: 10 mg via INTRAVENOUS
  Administered 2018-03-09: 70 mg via INTRAVENOUS

## 2018-03-09 NOTE — Anesthesia Post-op Follow-up Note (Signed)
Anesthesia QCDR form completed.        

## 2018-03-09 NOTE — Anesthesia Preprocedure Evaluation (Signed)
Anesthesia Evaluation  Patient identified by MRN, date of birth, ID band Patient awake    Reviewed: Allergy & Precautions, H&P , NPO status , Patient's Chart, lab work & pertinent test results, reviewed documented beta blocker date and time   History of Anesthesia Complications Negative for: history of anesthetic complications  Airway Mallampati: I  TM Distance: >3 FB Neck ROM: full    Dental  (+) Dental Advidsory Given, Teeth Intact   Pulmonary neg pulmonary ROS, former smoker,           Cardiovascular Exercise Tolerance: Good negative cardio ROS       Neuro/Psych negative neurological ROS  negative psych ROS   GI/Hepatic negative GI ROS, Neg liver ROS,   Endo/Other  diabetes, Well Controlled  Renal/GU Renal disease (kidney stones)  negative genitourinary   Musculoskeletal   Abdominal   Peds  Hematology negative hematology ROS (+)   Anesthesia Other Findings Past Medical History: No date: Adopted No date: Diabetes mellitus     Comment:  PT WAS TAKING METFORMIN BUT PCP TOOK PT OFF DUE TO MOST               RECENT AIC OF 6.4 ON 07-2017 No date: Dyslipidemia No date: Erectile dysfunction 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     Comment:  Upper Lip No date: Hyperlipidemia No date: Male hypogonadism No date: Prostate cancer ([place]) No date: Renal stones No date: Thyroid nodule     Comment:  Right   Reproductive/Obstetrics negative OB ROS                             Anesthesia Physical  Anesthesia Plan  ASA: II  Anesthesia Plan: General   Post-op Pain Management:    Induction: Intravenous  PONV Risk Score and Plan: 2 and Propofol infusion  Airway Management Planned: Natural Airway and Nasal Cannula  Additional Equipment:   Intra-op Plan:   Post-operative Plan: Extubation in OR  Informed Consent: I have reviewed the patients History and Physical, chart, labs and discussed the  procedure including the risks, benefits and alternatives for the proposed anesthesia with the patient or authorized representative who has indicated his/her understanding and acceptance.   Dental Advisory Given  Plan Discussed with: Anesthesiologist, CRNA and Surgeon  Anesthesia Plan Comments:         Anesthesia Quick Evaluation

## 2018-03-09 NOTE — Op Note (Addendum)
[facility] Gastroenterology Patient Name: Xavier Esparza Procedure Date: 03/09/2018 9:43 AM MRN: [id number] Account #: [id number] Date of Birth: [birth year] Admit Type: Outpatient Age: 72 Room: [facility] ENDO ROOM 2 Gender: Male Note Status: Finalized Procedure:            Colonoscopy Indications:          Screening for colorectal malignant neoplasm, This is                        the patient's first colonoscopy Providers:            Lin Landsman MD, MD Referring MD:         Elveria Rising. Damita Dunnings, MD (Referring MD) Medicines:            Monitored Anesthesia Care Complications:        No immediate complications. Estimated blood loss: None. Procedure:            Pre-Anesthesia Assessment:                       - Prior to the procedure, a History and Physical was                        performed, and patient medications and allergies were                        reviewed. The patient is competent. The risks and                        benefits of the procedure and the sedation options and                        risks were discussed with the patient. All questions                        were answered and informed consent was obtained.                        Patient identification and proposed procedure were                        verified by the physician, the nurse, the                        anesthesiologist, the anesthetist and the technician in                        the pre-procedure area in the procedure room in the                        endoscopy suite. Mental Status Examination: alert and                        oriented. Airway Examination: normal oropharyngeal                        airway and neck mobility. Respiratory Examination:                        clear to auscultation. CV Examination: normal.  Prophylactic Antibiotics: The patient does not require                        prophylactic antibiotics. Prior Anticoagulants: The         patient has taken no previous anticoagulant or                        antiplatelet agents. ASA Grade Assessment: II - A                        patient with mild systemic disease. After reviewing the                        risks and benefits, the patient was deemed in                        satisfactory condition to undergo the procedure. The                        anesthesia plan was to use monitored anesthesia care                        (MAC). Immediately prior to administration of                        medications, the patient was re-assessed for adequacy                        to receive sedatives. The heart rate, respiratory rate,                        oxygen saturations, blood pressure, adequacy of                        pulmonary ventilation, and response to care were                        monitored throughout the procedure. The physical status                        of the patient was re-assessed after the procedure.                       After obtaining informed consent, the colonoscope was                        passed under direct vision. Throughout the procedure,                        the patient's blood pressure, pulse, and oxygen                        saturations were monitored continuously. The                        Colonoscope was introduced through the anus and                        advanced to the the terminal ileum. The colonoscopy was  somewhat difficult due to significant looping.                        Successful completion of the procedure was aided by                        applying abdominal pressure. The patient tolerated the                        procedure well. The quality of the bowel preparation                        was evaluated using the BBPS [facility] Bowel Preparation                        Scale) with scores of: Right Colon = 2 (minor amount of                        residual staining, small fragments of stool and/or                         opaque liquid, but mucosa seen well), Transverse Colon                        = 3 (entire mucosa seen well with no residual staining,                        small fragments of stool or opaque liquid) and Left                        Colon = 3 (entire mucosa seen well with no residual                        staining, small fragments of stool or opaque liquid).                        The total BBPS score equals 8. The quality of the bowel                        preparation was good. Findings:      The terminal ileum appeared normal.      Four sessile polyps were found in the transverse colon, ascending colon       and cecum. The polyps were 5 to 8 mm in size. These polyps were removed       with a hot snare. Resection and retrieval were complete.      Multiple small and large-mouthed diverticula were found in the sigmoid       colon and descending colon. There was no evidence of diverticular       bleeding.      The retroflexed view of the distal rectum and anal verge was normal and       showed no anal or rectal abnormalities.      The exam was otherwise without abnormality. Impression:           - The examined portion of the ileum was normal.                       - Four  5 to 8 mm polyps in the transverse colon, in the                        ascending colon and in the cecum, removed with a hot                        snare. Resected and retrieved.                       - Severe diverticulosis in the sigmoid colon and in the                        descending colon. There was no evidence of diverticular                        bleeding.                       - The distal rectum and anal verge are normal on                        retroflexion view.                       - The examination was otherwise normal. Recommendation:       - Discharge patient to home.                       - Resume previous diet today.                       - Continue present medications.                        - Await pathology results.                       - Repeat colonoscopy in 3 years for surveillance of                        multiple polyps. Procedure Code(s):    --- Professional ---                       [id number], Colonoscopy, flexible; with removal of tumor(s),                        polyp(s), or other lesion(s) by snare technique Diagnosis Code(s):    --- Professional ---                       Z12.11, Encounter for screening for malignant neoplasm                        of colon                       D12.3, Benign neoplasm of transverse colon (hepatic                        flexure or splenic flexure)                       D12.2, Benign  neoplasm of ascending colon                       D12.0, Benign neoplasm of cecum                       K57.30, Diverticulosis of large intestine without                        perforation or abscess without bleeding CPT copyright 2016 American Medical Association. All rights reserved. The codes documented in this report are preliminary and upon coder review may  be revised to meet current compliance requirements. Dr. Ulyess Mort Lin Landsman MD, MD 03/09/2018 10:18:57 AM This report has been signed electronically. Number of Addenda: 0 Note Initiated On: 03/09/2018 9:43 AM Scope Withdrawal Time: 0 hours 17 minutes 22 seconds  Total Procedure Duration: 0 hours 25 minutes 25 seconds       [facility]

## 2018-03-09 NOTE — Transfer of Care (Signed)
Immediate Anesthesia Transfer of Care Note  Patient: Xavier Esparza  Procedure(s) Performed: COLONOSCOPY WITH PROPOFOL (N/A )  Patient Location: PACU  Anesthesia Type:General  Level of Consciousness: sedated  Airway & Oxygen Therapy: Patient Spontanous Breathing and Patient connected to nasal cannula oxygen  Post-op Assessment: Report given to RN and Post -op Vital signs reviewed and stable  Post vital signs: Reviewed and stable  Last Vitals:  Vitals:   03/09/18 0815 03/09/18 1018  BP: 127/72   Pulse: 70   Resp: 16   Temp: (!) 35.7 C (!) 36.3 C  SpO2: 100%     Last Pain:  Vitals:   03/09/18 1018  TempSrc: Tympanic         Complications: No apparent anesthesia complications

## 2018-03-09 NOTE — Anesthesia Procedure Notes (Signed)
Performed by: Holden Draughon, CRNA Pre-anesthesia Checklist: Patient identified, Emergency Drugs available, Suction available, Patient being monitored and Timeout performed Patient Re-evaluated:Patient Re-evaluated prior to induction Oxygen Delivery Method: Nasal cannula Induction Type: IV induction       

## 2018-03-09 NOTE — H&P (Addendum)
[name], MD [address]  [place]  [place], [place] [address]  Main: [phone]  Fax: [phone] Pager: [phone]  Primary Care Physician:  Tonia Ghent, MD Primary Gastroenterologist:  Dr. [name]  Pre-Procedure History & Physical: HPI:  Xavier Esparza is a 72 y.o. male is here for an colonoscopy.   Past Medical History:  Diagnosis Date  . Adopted   . Diabetes mellitus    PT WAS TAKING METFORMIN BUT PCP TOOK PT OFF DUE TO MOST RECENT AIC OF 6.4 ON 07-2017  . Dyslipidemia   . Erectile dysfunction   . Herpes labialis    Upper Lip  . Hyperlipidemia   . Male hypogonadism   . Prostate cancer ([place])   . Renal stones   . Thyroid nodule    Right    Past Surgical History:  Procedure Laterality Date  . CYSTOSCOPY W/ URETERAL STENT PLACEMENT  08/09/2017   Procedure: CYSTOSCOPY WITH RETROGRADE PYELOGRAM/URETERAL STENT PLACEMENT;  Surgeon: Royston Cowper, MD;  Location: [facility] ORS;  Service: Urology;;  . EXTRACORPOREAL SHOCK WAVE LITHOTRIPSY Left 09/15/2017   Procedure: EXTRACORPOREAL SHOCK WAVE LITHOTRIPSY (ESWL);  Surgeon: Royston Cowper, MD;  Location: [facility] ORS;  Service: Urology;  Laterality: Left;  . EYE SURGERY Bilateral 2016   cataract extractions  . GREEN LIGHT LASER TURP (TRANSURETHRAL RESECTION OF PROSTATE N/A 04/20/2016   Procedure: GREEN LIGHT LASER TURP (TRANSURETHRAL RESECTION OF PROSTATE;  Surgeon: Royston Cowper, MD;  Location: [facility] ORS;  Service: Urology;  Laterality: N/A;  . INGUINAL HERNIA REPAIR  01/11/2012   Procedure: HERNIA REPAIR INGUINAL ADULT;  Surgeon: Pedro Earls, MD;  Location: [place];  Service: General;  Laterality: Right;  Open right inguinal hernia repair with mesh  . RADIOACTIVE SEED IMPLANT     PROSTATE  . URETEROSCOPY WITH HOLMIUM LASER LITHOTRIPSY Right 08/09/2017   Procedure: URETEROSCOPY WITH HOLMIUM LASER LITHOTRIPSY;  Surgeon: Royston Cowper, MD;  Location: [facility] ORS;  Service: Urology;   Laterality: Right;    Prior to Admission medications   Medication Sig Start Date End Date Taking? Authorizing Provider  acyclovir (ZOVIRAX) 800 MG tablet Take 1 tablet (800 mg total) by mouth daily. 10/26/17  Yes Tonia Ghent, MD  Calcium Carbonate (CALCIUM 600 PO) Take 2 tablets by mouth daily.   Yes [provider]  GLUCOSAMINE HCL PO Take 1 tablet by mouth daily.    Yes [provider]  lisinopril (ZESTRIL) 2.5 MG tablet Take 1 tablet (2.5 mg total) by mouth daily. 02/14/18  Yes Tonia Ghent, MD  Magnesium 500 MG TABS Take 1 tablet by mouth at bedtime.   Yes [provider]  metFORMIN (GLUCOPHAGE) 500 MG tablet Take 500 mg by mouth daily.   Yes [provider]  Multiple Vitamin (MULTIVITAMIN) tablet Take 1 tablet by mouth daily. Reported on 01/07/2016   Yes [provider]  Omega-3 Fatty Acids ([employer] FISH OIL PO) Take 2 tablets by mouth daily.    Yes [provider]  Red Yeast Rice 600 MG TABS Take 2 tablets (1,200 mg total) every other day by mouth. Alternate with crestor 11/11/17  Yes Tonia Ghent, MD  rosuvastatin (CRESTOR) 20 MG tablet Take 1 tablet (20 mg total) every other day by mouth. TAKE 1 TABLET DAILY-AM 11/11/17  Yes Tonia Ghent, MD  sildenafil (REVATIO) 20 MG tablet 1 by mouth per day 01/07/16  Yes Biagio Borg, MD    Allergies  as of 02/17/2018  . (No Known Allergies)    Family History  Adopted: Yes    Social History   Socioeconomic History  . Marital status: Married    Spouse name: Not on file  . Number of children: Not on file  . Years of education: Not on file  . Highest education level: Not on file  Social Needs  . Financial resource strain: Not on file  . Food insecurity - worry: Not on file  . Food insecurity - inability: Not on file  . Transportation needs - medical: Not on file  . Transportation needs - non-medical: Not on file  Occupational History  . Not on file  Tobacco Use  .  Smoking status: Former Smoker    Years: 30.00    Types: Cigarettes    Last attempt to quit: 12/27/1990    Years since quitting: 27.2  . Smokeless tobacco: Never Used  Substance and Sexual Activity  . Alcohol use: Yes    Comment: Rare Socially  . Drug use: No  . Sexual activity: Not on file  Other Topics Concern  . Not on file  Social History Narrative   From [place] then [employer]   Married 1971   2 children alive as of April 16, 2017, 1 biological child and one stepchild   One child died 04-16-17    Review of Systems: See HPI, otherwise negative ROS  Physical Exam: BP 127/72   Pulse 70   Temp (!) 96.3 F (35.7 C) (Tympanic)   Resp 16   Ht 6' (1.829 m)   Wt 216 lb (98 kg)   SpO2 100%   BMI 29.29 kg/m  General:   Alert,  pleasant and cooperative in NAD Head:  Normocephalic and atraumatic. Neck:  Supple; no masses or thyromegaly. Lungs:  Clear throughout to auscultation.    Heart:  Regular rate and rhythm. Abdomen:  Soft, nontender and nondistended. Normal bowel sounds, without guarding, and without rebound.   Neurologic:  Alert and  oriented x4;  grossly normal neurologically.  Impression/Plan: Xavier Esparza is here for an colonoscopy to be performed for colon cancer screening  Risks, benefits, limitations, and alternatives regarding  colonoscopy have been reviewed with the patient.  Questions have been answered.  All parties agreeable.   Sherri Sear, MD  03/09/2018, 8:48 AM

## 2018-03-10 ENCOUNTER — Encounter: Payer: Self-pay | Admitting: Gastroenterology

## 2018-03-10 LAB — SURGICAL PATHOLOGY

## 2018-03-12 NOTE — Anesthesia Postprocedure Evaluation (Signed)
Anesthesia Post Note  Patient: Xavier Esparza  Procedure(s) Performed: COLONOSCOPY WITH PROPOFOL (N/A )  Patient location during evaluation: Endoscopy Anesthesia Type: General Level of consciousness: awake and alert Pain management: pain level controlled Vital Signs Assessment: post-procedure vital signs reviewed and stable Respiratory status: spontaneous breathing, nonlabored ventilation, respiratory function stable and patient connected to nasal cannula oxygen Cardiovascular status: blood pressure returned to baseline and stable Postop Assessment: no apparent nausea or vomiting Anesthetic complications: no     Last Vitals:  Vitals:   03/09/18 1018 03/09/18 1048  BP:  127/89  Pulse:    Resp:    Temp: (!) 36.3 C   SpO2:      Last Pain:  Vitals:   03/09/18 1038  TempSrc:   PainSc: 0-No pain                 Martha Clan

## 2018-03-27 ENCOUNTER — Other Ambulatory Visit: Payer: Self-pay | Admitting: Family Medicine

## 2018-03-27 NOTE — Telephone Encounter (Signed)
Copied from [place] [phone]. Topic: Quick Communication - Rx Refill/Question >> Mar 27, 2018  4:21 PM Percell Belt A wrote: Medication: Crestor and Metformin Has the patient contacted their pharmacy? No  (Agent: If no, request that the patient contact the pharmacy for the refill.) Preferred Pharmacy (with phone number or street name): [facility] on [address]: Please be advised that RX refills may take up to 3 business days. We ask that you follow-up with your pharmacy.

## 2018-03-28 MED ORDER — METFORMIN HCL 500 MG PO TABS: 500.0000 mg | ORAL_TABLET | Freq: Every day | ORAL | 1 refills | Status: DC

## 2018-03-28 MED ORDER — ROSUVASTATIN CALCIUM 20 MG PO TABS: 20.0000 mg | ORAL_TABLET | ORAL | 1 refills | Status: DC

## 2018-03-28 NOTE — Telephone Encounter (Signed)
Rx refill request: Crestor and Metformin  LOV: 02/14/18  PCP: [place]: verified

## 2018-05-11 ENCOUNTER — Ambulatory Visit (INDEPENDENT_AMBULATORY_CARE_PROVIDER_SITE_OTHER): Payer: Medicare Other

## 2018-05-11 VITALS — BP 124/82 | HR 56 | Temp 98.7°F | Ht 71.5 in | Wt 222.5 lb

## 2018-05-11 DIAGNOSIS — Z0000 Encounter for general adult medical examination without abnormal findings: Secondary | ICD-10-CM

## 2018-05-11 DIAGNOSIS — Z8639 Personal history of other endocrine, nutritional and metabolic disease: Secondary | ICD-10-CM

## 2018-05-11 LAB — COMPREHENSIVE METABOLIC PANEL
ALK PHOS: 47 U/L (ref 39–117)
ALT: 15 U/L (ref 0–53)
AST: 22 U/L (ref 0–37)
Albumin: 4 g/dL (ref 3.5–5.2)
BILIRUBIN TOTAL: 0.6 mg/dL (ref 0.2–1.2)
BUN: 15 mg/dL (ref 6–23)
CO2: 30 mEq/L (ref 19–32)
CREATININE: 1.32 mg/dL (ref 0.40–1.50)
Calcium: 9.4 mg/dL (ref 8.4–10.5)
Chloride: 104 mEq/L (ref 96–112)
GFR: 68.36 mL/min (ref >60.00)
GLUCOSE: 105 mg/dL — AB (ref 70–99)
Potassium: 4.7 mEq/L (ref 3.5–5.1)
Sodium: 139 mEq/L (ref 135–145)
TOTAL PROTEIN: 7.2 g/dL (ref 6.0–8.3)

## 2018-05-11 LAB — LIPID PANEL
Cholesterol: 160 mg/dL (ref 0–200)
HDL: 47.1 mg/dL (ref >39.00)
LDL Cholesterol: 73 mg/dL (ref 0–99)
NONHDL: 112.74
Total CHOL/HDL Ratio: 3
Triglycerides: 200 mg/dL — ABNORMAL HIGH (ref 0.0–149.0)
VLDL: 40 mg/dL (ref 0.0–40.0)

## 2018-05-11 LAB — HEMOGLOBIN A1C: HEMOGLOBIN A1C: 6.4 % (ref 4.6–6.5)

## 2018-05-11 NOTE — Patient Instructions (Signed)
Mr. Amedee , Thank you for taking time to come for your Medicare Wellness Visit. I appreciate your ongoing commitment to your health goals. Please review the following plan we discussed and let me know if I can assist you in the future.   These are the goals we discussed: Goals    . Increase physical activity     Starting 05/11/2018, I will continue to walk for 60 minutes 4-5 days per week and to do strength training for 25-30 minutes 3 days per week.        This is a list of the screening recommended for you and due dates:  Health Maintenance  Topic Date Due  . Tetanus Vaccine  05/12/2019*  . Flu Shot  07/27/2018  . Eye exam for diabetics  11/10/2018  . Complete foot exam   11/11/2018  . Hemoglobin A1C  11/11/2018  . Colon Cancer Screening  03/09/2021  .  Hepatitis C: One time screening is recommended by Center for Disease Control  (CDC) for  adults born from [birth year] through 1966.   Completed  . Pneumonia vaccines  Completed  *Topic was postponed. The date shown is not the original due date.   Preventive Care for Adults  A healthy lifestyle and preventive care can promote health and wellness. Preventive health guidelines for adults include the following key practices.  . A routine yearly physical is a good way to check with your health care provider about your health and preventive screening. It is a chance to share any concerns and updates on your health and to receive a thorough exam.  . Visit your dentist for a routine exam and preventive care every 6 months. Brush your teeth twice a day and floss once a day. Good oral hygiene prevents tooth decay and gum disease.  . The frequency of eye exams is based on your age, health, family medical history, use  of contact lenses, and other factors. Follow your health care provider's recommendations for frequency of eye exams.  . Eat a healthy diet. Foods like vegetables, fruits, whole grains, low-fat dairy products, and lean protein foods  contain the nutrients you need without too many calories. Decrease your intake of foods high in solid fats, added sugars, and salt. Eat the right amount of calories for you. Get information about a proper diet from your health care provider, if necessary.  . Regular physical exercise is one of the most important things you can do for your health. Most adults should get at least 150 minutes of moderate-intensity exercise (any activity that increases your heart rate and causes you to sweat) each week. In addition, most adults need muscle-strengthening exercises on 2 or more days a week.  Silver Sneakers may be a benefit available to you. To determine eligibility, you may visit the website: www.silversneakers.com or contact program at (276) 124-5390 Mon-Fri between 8AM-8PM.   . Maintain a healthy weight. The body mass index (BMI) is a screening tool to identify possible weight problems. It provides an estimate of body fat based on height and weight. Your health care provider can find your BMI and can help you achieve or maintain a healthy weight.   For adults 20 years and older: ? A BMI below 18.5 is considered underweight. ? A BMI of 18.5 to 24.9 is normal. ? A BMI of 25 to 29.9 is considered overweight. ? A BMI of 30 and above is considered obese.   . Maintain normal blood lipids and cholesterol levels by exercising and  minimizing your intake of saturated fat. Eat a balanced diet with plenty of fruit and vegetables. Blood tests for lipids and cholesterol should begin at age 33 and be repeated every 5 years. If your lipid or cholesterol levels are high, you are over 50, or you are at high risk for heart disease, you may need your cholesterol levels checked more frequently. Ongoing high lipid and cholesterol levels should be treated with medicines if diet and exercise are not working.  . If you smoke, find out from your health care provider how to quit. If you do not use tobacco, please do not  start.  . If you choose to drink alcohol, please do not consume more than 2 drinks per day. One drink is considered to be 12 ounces (355 mL) of beer, 5 ounces (148 mL) of wine, or 1.5 ounces (44 mL) of liquor.  . If you are 57-49 years old, ask your health care provider if you should take aspirin to prevent strokes.  . Use sunscreen. Apply sunscreen liberally and repeatedly throughout the day. You should seek shade when your shadow is shorter than you. Protect yourself by wearing long sleeves, pants, a wide-brimmed hat, and sunglasses year round, whenever you are outdoors.  . Once a month, do a whole body skin exam, using a mirror to look at the skin on your back. Tell your health care provider of new moles, moles that have irregular borders, moles that are larger than a pencil eraser, or moles that have changed in shape or color.

## 2018-05-11 NOTE — Progress Notes (Signed)
Subjective:   Xavier Esparza is a 72 y.o. male who presents for Medicare Annual/Subsequent preventive examination.  Review of Systems:  N/A Cardiac Risk Factors include: advanced age (>26men, >83 women);male gender;dyslipidemia     Objective:    Vitals: BP 124/82 (BP Location: Right Arm, Patient Position: Sitting, Cuff Size: Normal)   Pulse (!) 56   Temp 98.7 F (37.1 C) (Oral)   Ht 5' 11.5" (1.816 m) Comment: no shoes  Wt 222 lb 8 oz (100.9 kg)   SpO2 97%   BMI 30.60 kg/m   Body mass index is 30.6 kg/m.  Advanced Directives 05/11/2018 03/09/2018 09/15/2017 08/08/2017 01/10/2012  Does Patient Have a Medical Advance Directive? No No No No Patient does not have advance directive  Would patient like information on creating a medical advance directive? Yes (MAU/Ambulatory/Procedural Areas - Information given) - No - Patient declined - -    Tobacco Social History   Tobacco Use  Smoking Status Former Smoker  . Years: 30.00  . Types: Cigarettes  . Last attempt to quit: 12/27/1990  . Years since quitting: 27.3  Smokeless Tobacco Never Used     Counseling given: No   Clinical Intake:  Pre-visit preparation completed: Yes  Pain : No/denies pain Pain Score: 0-No pain     Nutritional Status: BMI 25 -29 Overweight Nutritional Risks: None Diabetes: No  How often do you need to have someone help you when you read instructions, pamphlets, or other written materials from your doctor or pharmacy?: 1 - Never What is the last grade level you completed in school?: Associate degree  Interpreter Needed?: No  Comments: pt lives with spouse Information entered by :: LPinson, LPN  Past Medical History:  Diagnosis Date  . Adopted   . Cataract    resolved with surgery  . Diabetes mellitus    PT WAS TAKING METFORMIN BUT PCP TOOK PT OFF DUE TO MOST RECENT AIC OF 6.4 ON 07-2017  . Dyslipidemia   . Erectile dysfunction   . Herpes labialis    Upper Lip  . Hyperlipidemia   . Male  hypogonadism   . Prostate cancer ([place])   . Renal stones   . Thyroid nodule    Right   Past Surgical History:  Procedure Laterality Date  . CATARACT EXTRACTION, BILATERAL  2017   Dr. Katy Fitch  . COLONOSCOPY WITH PROPOFOL N/A 03/09/2018   Procedure: COLONOSCOPY WITH PROPOFOL;  Surgeon: Lin Landsman, MD;  Location: [facility] ENDOSCOPY;  Service: Gastroenterology;  Laterality: N/A;  . CYSTOSCOPY W/ URETERAL STENT PLACEMENT  08/09/2017   Procedure: CYSTOSCOPY WITH RETROGRADE PYELOGRAM/URETERAL STENT PLACEMENT;  Surgeon: Royston Cowper, MD;  Location: [facility] ORS;  Service: Urology;;  . EXTRACORPOREAL SHOCK WAVE LITHOTRIPSY Left 09/15/2017   Procedure: EXTRACORPOREAL SHOCK WAVE LITHOTRIPSY (ESWL);  Surgeon: Royston Cowper, MD;  Location: [facility] ORS;  Service: Urology;  Laterality: Left;  . EYE SURGERY Bilateral 2016   cataract extractions  . GREEN LIGHT LASER TURP (TRANSURETHRAL RESECTION OF PROSTATE N/A 04/20/2016   Procedure: GREEN LIGHT LASER TURP (TRANSURETHRAL RESECTION OF PROSTATE;  Surgeon: Royston Cowper, MD;  Location: [facility] ORS;  Service: Urology;  Laterality: N/A;  . INGUINAL HERNIA REPAIR  01/11/2012   Procedure: HERNIA REPAIR INGUINAL ADULT;  Surgeon: Pedro Earls, MD;  Location: [place];  Service: General;  Laterality: Right;  Open right inguinal hernia repair with mesh  . RADIOACTIVE SEED IMPLANT     PROSTATE  . URETEROSCOPY WITH HOLMIUM LASER  LITHOTRIPSY Right 08/09/2017   Procedure: URETEROSCOPY WITH HOLMIUM LASER LITHOTRIPSY;  Surgeon: Royston Cowper, MD;  Location: [facility] ORS;  Service: Urology;  Laterality: Right;   Family History  Adopted: Yes   Social History   Socioeconomic History  . Marital status: Married    Spouse name: Not on file  . Number of children: Not on file  . Years of education: Not on file  . Highest education level: Not on file  Occupational History  . Not on file  Social Needs  . Financial resource strain: Not on file  . Food  insecurity:    Worry: Not on file    Inability: Not on file  . Transportation needs:    Medical: Not on file    Non-medical: Not on file  Tobacco Use  . Smoking status: Former Smoker    Years: 30.00    Types: Cigarettes    Last attempt to quit: 12/27/1990    Years since quitting: 27.3  . Smokeless tobacco: Never Used  Substance and Sexual Activity  . Alcohol use: Yes    Comment: Rare Socially  . Drug use: No  . Sexual activity: Yes  Lifestyle  . Physical activity:    Days per week: Not on file    Minutes per session: Not on file  . Stress: Not on file  Relationships  . Social connections:    Talks on phone: Not on file    Gets together: Not on file    Attends religious service: Not on file    Active member of club or organization: Not on file    Attends meetings of clubs or organizations: Not on file    Relationship status: Not on file  Other Topics Concern  . Not on file  Social History Narrative   From [place] then [employer]   Married 03/30/1970   2 children alive as of 2017-03-30, 1 biological child and one stepchild   One child died 2017/03/30    Outpatient Encounter Medications as of 05/11/2018  Medication Sig  . acyclovir (ZOVIRAX) 800 MG tablet Take 1 tablet (800 mg total) by mouth daily.  . Calcium Carbonate (CALCIUM 600 PO) Take 2 tablets by mouth daily.  [name] GLUCOSAMINE HCL PO Take 1 tablet by mouth daily.   [name] lisinopril (ZESTRIL) 2.5 MG tablet Take 1 tablet (2.5 mg total) by mouth daily.  . Magnesium 500 MG TABS Take 1 tablet by mouth at bedtime.  . metFORMIN (GLUCOPHAGE) 500 MG tablet Take 1 tablet (500 mg total) by mouth daily.  . Multiple Vitamin (MULTIVITAMIN) tablet Take 1 tablet by mouth daily. Reported on 01/07/2016  . Omega-3 Fatty Acids ([employer] FISH OIL PO) Take 2 tablets by mouth daily.   . Red Yeast Rice 600 MG TABS Take 2 tablets (1,200 mg total) every other day by mouth. Alternate with crestor  . rosuvastatin (CRESTOR) 20 MG  tablet Take 1 tablet (20 mg total) by mouth every other day. TAKE 1 TABLET DAILY-AM  . sildenafil (REVATIO) 20 MG tablet 1 by mouth per day   No facility-administered encounter medications on file as of 05/11/2018.     Activities of Daily Living In your present state of health, do you have any difficulty performing the following activities: 05/11/2018 08/08/2017  Hearing? N -  Vision? N -  Difficulty concentrating or making decisions? N -  Walking or climbing stairs? N -  Dressing or bathing? N -  Doing errands, shopping? N N  Preparing Food and eating ? N -  Using the Toilet? N -  In the past six months, have you accidently leaked urine? N -  Do you have problems with loss of bowel control? N -  Managing your Medications? N -  Managing your Finances? N -  Housekeeping or managing your Housekeeping? N -  Some recent data might be hidden    Patient Care Team: Tonia Ghent, MD as PCP - General (Family Medicine)   Assessment:   This is a routine wellness examination for Ja[place]on.  Exercise Activities and Dietary recommendations Current Exercise Habits: Home exercise routine, Type of exercise: strength training/weights;walking, Time (Minutes): 60, Frequency (Times/Week): 5, Weekly Exercise (Minutes/Week): 300, Intensity: Moderate, Exercise limited by: None identified  Goals    . Increase physical activity     Starting 05/11/2018, I will continue to walk for 60 minutes 4-5 days per week and to do strength training for 25-30 minutes 3 days per week.        Fall Risk Fall Risk  05/11/2018 08/02/2017 01/07/2016 11/08/2014  Falls in the past year? No No No No   Depression Screen PHQ 2/9 Scores 05/11/2018 08/02/2017 01/07/2016 11/08/2014  PHQ - 2 Score 0 0 0 0  PHQ- 9 Score 0 - - -    Cognitive Function MMSE - Mini Mental State Exam 05/11/2018  Orientation to time 5  Orientation to Place 5  Registration 3  Attention/ Calculation 0  Recall 3  Language- name 2 objects 0  Language-  repeat 1  Language- follow 3 step command 3  Language- read & follow direction 0  Write a sentence 0  Copy design 0  Total score 20     PLEASE NOTE: A Mini-Cog screen was completed. Maximum score is 20. A value of 0 denotes this part of Folstein MMSE was not completed or the patient failed this part of the Mini-Cog screening.   Mini-Cog Screening Orientation to Time - Max 5 pts Orientation to Place - Max 5 pts Registration - Max 3 pts Recall - Max 3 pts Language Repeat - Max 1 pts Language Follow 3 Step Command - Max 3 pts     Immunization History  Administered Date(s) Administered  . Influenza Split 09/21/2011, 09/21/2012  . Influenza Whole 09/14/2010  . Influenza, High Dose Seasonal PF 11/30/2016  . Influenza,inj,Quad PF,6+ Mos 09/26/2013, 11/08/2014, 11/28/2015, 10/13/2017  . Pneumococcal Conjugate-13 09/26/2013  . Pneumococcal Polysaccharide-23 09/17/2010  . Td 04/14/2007  . Zoster 10/05/2011    Screening Tests Health Maintenance  Topic Date Due  . TETANUS/TDAP  05/12/2019 (Originally 04/13/2017)  . INFLUENZA VACCINE  07/27/2018  . OPHTHALMOLOGY EXAM  11/10/2018  . FOOT EXAM  11/11/2018  . HEMOGLOBIN A1C  11/11/2018  . COLONOSCOPY  03/09/2021  . Hepatitis C Screening  Completed  . PNA vac Low Risk Adult  Completed       Plan:     I have personally reviewed, addressed, and noted the following in the patient's chart:  A. Medical and social history B. Use of alcohol, tobacco or illicit drugs  C. Current medications and supplements D. Functional ability and status E.  Nutritional status F.  Physical activity G. Advance directives H. List of other physicians I.  Hospitalizations, surgeries, and ER visits in previous 12 months J.  [place] to include hearing, vision, cognitive, depression L. Referrals and appointments - none  In addition, I have reviewed and discussed with patient  certain preventive protocols, quality metrics, and best practice  recommendations. A written personalized care plan for preventive services as well as general preventive health recommendations were provided to patient.  See attached scanned questionnaire for additional information.   Signed,   Lindell Noe, MHA, BS, LPN Health Coach

## 2018-05-11 NOTE — Progress Notes (Signed)
PCP notes:   Health maintenance:  A1C - completed Tetanus vaccine - postponed/insurance  Abnormal screenings:   Hearing - failed  Hearing Screening   125Hz  250Hz  500Hz  1000Hz  2000Hz  3000Hz  4000Hz  6000Hz  8000Hz   Right ear:   40 40 40  0    Left ear:   40 40 40  0     Patient concerns:   Patient wants to discuss measles vaccine at next appt with PCP Nurse concerns: None Next PCP appt:  5/21 @ 0930  I reviewed health advisor's note, was available for consultation on the day of service listed in this note, and agree with documentation and plan. Elsie Stain, MD.

## 2018-05-16 ENCOUNTER — Encounter: Payer: Self-pay | Admitting: Family Medicine

## 2018-05-16 ENCOUNTER — Ambulatory Visit (INDEPENDENT_AMBULATORY_CARE_PROVIDER_SITE_OTHER): Payer: Medicare Other | Admitting: Family Medicine

## 2018-05-16 VITALS — BP 118/82 | HR 60 | Temp 98.7°F | Ht 71.5 in | Wt 222.5 lb

## 2018-05-16 DIAGNOSIS — Z0000 Encounter for general adult medical examination without abnormal findings: Secondary | ICD-10-CM

## 2018-05-16 DIAGNOSIS — Z7189 Other specified counseling: Secondary | ICD-10-CM

## 2018-05-16 DIAGNOSIS — E119 Type 2 diabetes mellitus without complications: Secondary | ICD-10-CM | POA: Diagnosis not present

## 2018-05-16 DIAGNOSIS — E7800 Pure hypercholesterolemia, unspecified: Secondary | ICD-10-CM

## 2018-05-16 NOTE — Patient Instructions (Addendum)
Check with your insurance to see if they will cover the Tdap shot. Recheck in about 6 months.  The only lab you need to have done for your next diabetic visit is an A1c.  We can do this with a fingerstick test at the office visit.  You do not need a lab visit ahead of time for this.  It does not matter if you are fasting when the lab is done.   If you need refills in the meantime then notify the pharmacy.   Take care.  Glad to see you.

## 2018-05-16 NOTE — Progress Notes (Signed)
Diabetes:  Using medications without difficulties: yes Hypoglycemic episodes:no sx Hyperglycemic episodes:no sx Feet problems: no Blood Sugars averaging: not checked.   eye exam within last year: yes Labs d/w pt.   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    Elevated Cholesterol: Using medications without problems:yes Muscle aches: no Diet compliance:yes Exercise:yes Labs d/w pt.    Declined hearing aids.  D/w pt.   Measles vaccine not needed based on his birth date.  D/w pt.  Tetanus d/w pt, see AVS.   Colonoscopy 2019.  PSA per urology. I'll defer, dw pt.  He has f/u with them in October.   Wife designated if patient were incapacitated.  He would have [employer] designated next if his wife were incapacitated.    PMH and SH reviewed.   Vital signs, Meds and allergies reviewed.  ROS: Per HPI unless specifically indicated in ROS section   GEN: nad, alert and oriented HEENT: mucous membranes moist NECK: supple w/o LA CV: rrr.  no murmur PULM: ctab, no inc wob ABD: soft, +bs EXT: no edema

## 2018-05-17 DIAGNOSIS — Z7189 Other specified counseling: Secondary | ICD-10-CM | POA: Insufficient documentation

## 2018-05-17 DIAGNOSIS — Z0000 Encounter for general adult medical examination without abnormal findings: Secondary | ICD-10-CM | POA: Insufficient documentation

## 2018-05-17 NOTE — Assessment & Plan Note (Addendum)
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  Discussed with patient.  Would continue ACE inhibitor and current medications for now.  Recheck periodically.  Continue work on diet and exercise.  Labs discussed with patient.  Rationale for treatment discussed with patient.  He agrees. >25 minutes spent in face to face time with patient, >50% spent in counselling or coordination of care, discussing diabetes, hypercholesterolemia, rationale for treatment, etc.

## 2018-05-17 NOTE — Assessment & Plan Note (Signed)
Reasonable control.  Continue alternating red yeast rice and statin.  Continue work on diet and exercise.  Labs discussed with patient.  He agrees.

## 2018-05-17 NOTE — Assessment & Plan Note (Signed)
Wife designated if patient were incapacitated.  He would have Sheryl Eller designated next if his wife were incapacitated.   °

## 2018-05-17 NOTE — Assessment & Plan Note (Signed)
Declined hearing aids.  D/w pt.   Measles vaccine not needed based on his birth date.  D/w pt.  Tetanus d/w pt, see AVS.   Colonoscopy 2019.  PSA per urology. I'll defer, dw pt.  He has f/u with them in October.   Wife designated if patient were incapacitated.  He would have [employer] designated next if his wife were incapacitated.

## 2018-08-24 ENCOUNTER — Ambulatory Visit: Payer: Medicare Other | Admitting: Family Medicine

## 2018-08-25 ENCOUNTER — Encounter: Payer: Self-pay | Admitting: Family Medicine

## 2018-08-25 ENCOUNTER — Ambulatory Visit (INDEPENDENT_AMBULATORY_CARE_PROVIDER_SITE_OTHER)
Admission: RE | Admit: 2018-08-25 | Discharge: 2018-08-25 | Disposition: A | Discharge status: Home / Self Care | Payer: Medicare Other | Source: Ambulatory Visit | Attending: Family Medicine | Admitting: Family Medicine

## 2018-08-25 ENCOUNTER — Ambulatory Visit (INDEPENDENT_AMBULATORY_CARE_PROVIDER_SITE_OTHER): Payer: Medicare Other | Admitting: Family Medicine

## 2018-08-25 VITALS — BP 122/80 | HR 66 | Temp 99.0°F | Ht 71.5 in | Wt 221.8 lb

## 2018-08-25 DIAGNOSIS — R059 Cough, unspecified: Secondary | ICD-10-CM

## 2018-08-25 DIAGNOSIS — R05 Cough: Secondary | ICD-10-CM

## 2018-08-25 DIAGNOSIS — L989 Disorder of the skin and subcutaneous tissue, unspecified: Secondary | ICD-10-CM

## 2018-08-25 IMAGING — DX DG CHEST 2V
2 series · 2 of 2 positions shown · non-contrast
Comparison: None.

CLINICAL DATA: Cough.  History of smoking.

EXAM:
CHEST - 2 VIEW

[chest pa]
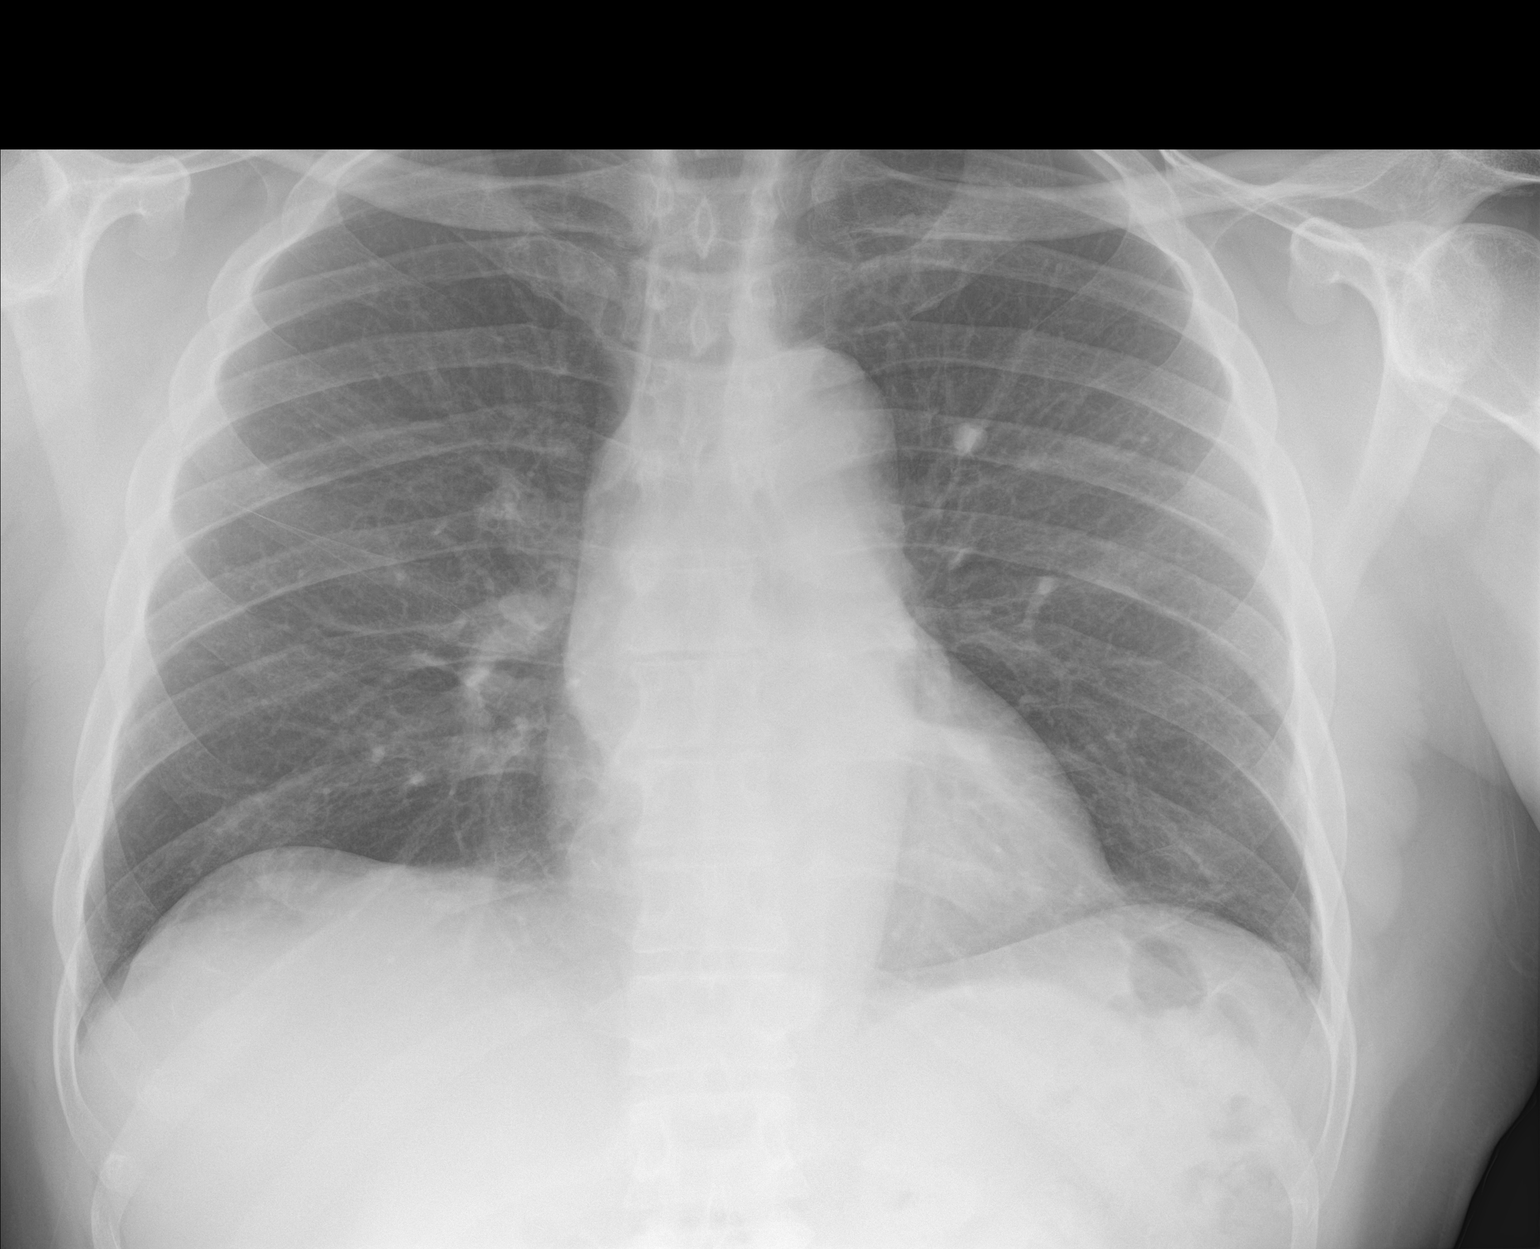

[chest lat]
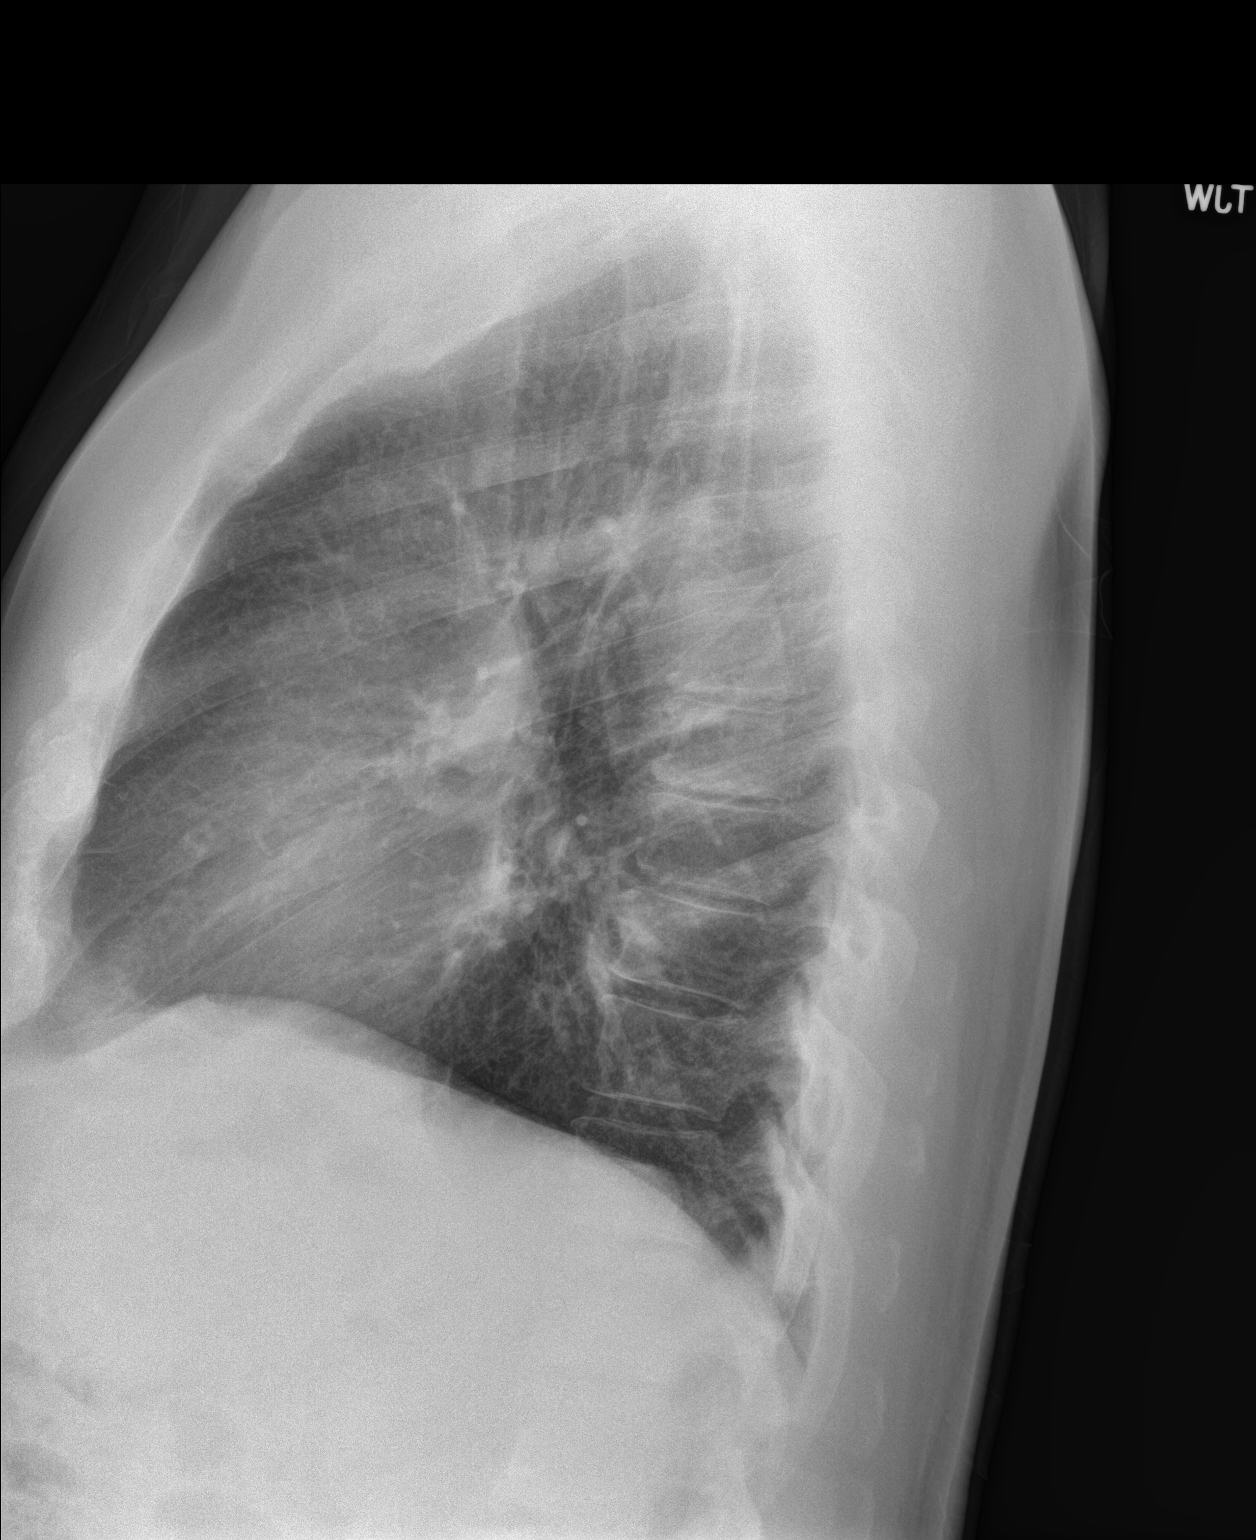

[2 of 2 positions shown; findings below may reference images not displayed]

FINDINGS: The cardiomediastinal silhouette is within normal limits. The lungs
are well inflated and clear. There is no evidence of pleural
effusion or pneumothorax. No acute osseous abnormality is
identified.
IMPRESSION: No active cardiopulmonary disease.

## 2018-08-25 NOTE — Progress Notes (Signed)
Cough.  Started about 4-6 weeks ago.  Ongoing.  No every second of every day but ongoing.  Some in the day, some at night.  Can happen with or without eating.  No sputum usually, minimal occ phlegm. Throat clearing.  No voice change.  He doesn't feel sick o/w.  No FCNAVD.  No heartburn.  No antecedent illness.  No wheeze.  No hemoptysis.  He quit smoking about 20 years ago.  On ACE.    R 2nd finger with no known trauma but subungual lesion noted.  Incidentally noted recently by patient.  Meds, vitals, and allergies reviewed.   ROS: Per HPI unless specifically indicated in ROS section   GEN: nad, alert and oriented HEENT: mucous membranes moist, OP wnl NECK: supple w/o LA CV: rrr. PULM: ctab, no inc wob ABD: soft, +bs EXT: no edema SKIN:  R 2nd finger with small subungual lesion noted.

## 2018-08-25 NOTE — Patient Instructions (Addendum)
Stop the lisinopril for now and update me in about 10-14 days.   If worse, then let me know.  Go to the lab on the way out.  We'll contact you with your xray report. We will call about your referral.  Rosaria Ferries or Azalee Course will call you if you don't see one of them on the way out.  Take care.  Glad to see you.

## 2018-08-28 DIAGNOSIS — R059 Cough, unspecified: Secondary | ICD-10-CM | POA: Insufficient documentation

## 2018-08-28 DIAGNOSIS — R05 Cough: Secondary | ICD-10-CM | POA: Insufficient documentation

## 2018-08-28 DIAGNOSIS — L989 Disorder of the skin and subcutaneous tissue, unspecified: Secondary | ICD-10-CM | POA: Insufficient documentation

## 2018-08-28 DIAGNOSIS — R053 Chronic cough: Secondary | ICD-10-CM | POA: Insufficient documentation

## 2018-08-28 NOTE — Assessment & Plan Note (Signed)
ddx d/w pt. well-appearing.  Okay for outpatient follow-up.  Asthma- no hx COPD- no hx  GERD- no sx Post nasal gtt- minimal sx ACE- possible cause. Post infectious- not likely   Discussed with patient about options.  Check cxr today.  See notes on imaging.  Stop ACE inhibitor.  He will update me if not improved.  He may end up needing treatment for silent GERD versus postnasal drip.  Rationale for plan discussed with patient.  He agrees.

## 2018-08-28 NOTE — Assessment & Plan Note (Signed)
This may just be a benign incidental nevus but I would like dermatology input.  Discussed with patient.  He agrees.  Referred.

## 2018-09-02 ENCOUNTER — Other Ambulatory Visit: Payer: Self-pay | Admitting: Family Medicine

## 2018-10-12 ENCOUNTER — Ambulatory Visit (INDEPENDENT_AMBULATORY_CARE_PROVIDER_SITE_OTHER): Payer: Medicare Other

## 2018-10-12 DIAGNOSIS — Z23 Encounter for immunization: Secondary | ICD-10-CM

## 2018-11-12 ENCOUNTER — Other Ambulatory Visit: Payer: Self-pay | Admitting: Family Medicine

## 2018-11-13 ENCOUNTER — Other Ambulatory Visit: Payer: Self-pay | Admitting: Family Medicine

## 2018-11-13 NOTE — Telephone Encounter (Signed)
Electronic refill request Zovirax Last office visit 08/25/18 Last refill 10/26/18 #90/2

## 2018-11-14 LAB — HM DIABETES EYE EXAM

## 2018-11-14 NOTE — Telephone Encounter (Signed)
Sent. Thanks.   

## 2018-11-16 ENCOUNTER — Ambulatory Visit: Payer: Medicare Other | Admitting: Family Medicine

## 2018-11-20 ENCOUNTER — Ambulatory Visit: Payer: Medicare Other | Admitting: Family Medicine

## 2018-11-20 DIAGNOSIS — Z0289 Encounter for other administrative examinations: Secondary | ICD-10-CM

## 2018-12-01 ENCOUNTER — Ambulatory Visit: Payer: Medicare Other | Admitting: Family Medicine

## 2018-12-05 ENCOUNTER — Ambulatory Visit: Payer: Medicare Other | Admitting: Family Medicine

## 2018-12-08 ENCOUNTER — Ambulatory Visit (INDEPENDENT_AMBULATORY_CARE_PROVIDER_SITE_OTHER): Payer: Medicare Other | Admitting: Family Medicine

## 2018-12-08 ENCOUNTER — Encounter: Payer: Self-pay | Admitting: Family Medicine

## 2018-12-08 VITALS — BP 132/84 | HR 64 | Temp 98.4°F | Ht 71.5 in | Wt 221.5 lb

## 2018-12-08 DIAGNOSIS — E119 Type 2 diabetes mellitus without complications: Secondary | ICD-10-CM | POA: Diagnosis not present

## 2018-12-08 LAB — POCT GLYCOSYLATED HEMOGLOBIN (HGB A1C): Hemoglobin A1C: 5.7 % — AB (ref 4.0–5.6)

## 2018-12-08 MED ORDER — ROSUVASTATIN CALCIUM 20 MG PO TABS: ORAL_TABLET | ORAL | 3 refills | Status: DC

## 2018-12-08 NOTE — Patient Instructions (Addendum)
Stop the metformin and recheck in 3 months.  Update me as needed in the meantime.    The only lab you need to have done for your next diabetic visit is an A1c.  We can do this with a fingerstick test at the office visit.  You do not need a lab visit ahead of time for this.  It does not matter if you are fasting when the lab is done.    You may not be diabetic at this point.  Thanks for your effort.  Take care.  Glad to see you.

## 2018-12-08 NOTE — Progress Notes (Signed)
Diabetes:  Using medications without difficulties: yes Hypoglycemic episodes: no sx Hyperglycemic episodes:no sx  Feet problems:not unless he has to do public speaking, with some itching after that (and it may be stress related) Blood Sugars averaging: not checked.   A1c improved, down to 5.7, d/w pt at [place].   He is trying to put with ED after urology procedure.  D/w pt.    Meds, vitals, and allergies reviewed.   ROS: Per HPI unless specifically indicated in ROS section   GEN: nad, alert and oriented HEENT: mucous membranes moist NECK: supple w/o LA CV: rrr. PULM: ctab, no inc wob ABD: soft, +bs EXT: no edema SKIN: well perfused.

## 2018-12-10 NOTE — Assessment & Plan Note (Signed)
Stop the metformin and recheck in 3 months.  We can do A1c at the visit. Update me as needed in the meantime.   He may not be diabetic at this point.  Discussed.  He is working on diet and exercise.  Continue as is otherwise.

## 2018-12-15 ENCOUNTER — Encounter: Payer: Self-pay | Admitting: Family Medicine

## 2019-02-26 ENCOUNTER — Telehealth: Payer: Self-pay

## 2019-02-26 ENCOUNTER — Ambulatory Visit (INDEPENDENT_AMBULATORY_CARE_PROVIDER_SITE_OTHER): Payer: Medicare Other | Admitting: Family Medicine

## 2019-02-26 ENCOUNTER — Encounter: Payer: Self-pay | Admitting: Family Medicine

## 2019-02-26 VITALS — BP 136/82 | HR 59 | Temp 98.5°F | Resp 18 | Ht 71.5 in | Wt 225.5 lb

## 2019-02-26 DIAGNOSIS — J069 Acute upper respiratory infection, unspecified: Secondary | ICD-10-CM

## 2019-02-26 DIAGNOSIS — B9789 Other viral agents as the cause of diseases classified elsewhere: Secondary | ICD-10-CM | POA: Diagnosis not present

## 2019-02-26 MED ORDER — BENZONATATE 100 MG PO CAPS: 100.0000 mg | ORAL_CAPSULE | Freq: Two times a day (BID) | ORAL | 0 refills | Status: DC | PRN

## 2019-02-26 NOTE — Progress Notes (Signed)
Subjective:     Xavier Esparza is a 73 y.o. male presenting for Cough (symptoms started on 02/21/2019. Some better but still has a lot of chest congestion, cough, rattling sound in the chest, runny nose. No fever. No body aches.)     Cough  This is a new problem. The current episode started in the past 7 days. The problem has been gradually improving. The problem occurs every few hours. The cough is non-productive. Associated symptoms include nasal congestion, rhinorrhea and wheezing. Pertinent negatives include no chest pain, chills, ear congestion, ear pain, fever, headaches, heartburn, myalgias, postnasal drip, sore throat, shortness of breath or sweats. Treatments tried: decongestant.   Sick contact at work - pink eye Worse when lays down at night - can hear the wheezing  Review of Systems  Constitutional: Negative for chills and fever.  HENT: Positive for congestion and rhinorrhea. Negative for ear pain, postnasal drip and sore throat.   Respiratory: Positive for cough and wheezing. Negative for shortness of breath.   Cardiovascular: Negative for chest pain.  Gastrointestinal: Negative for diarrhea, heartburn, nausea and vomiting.  Musculoskeletal: Negative for myalgias.  Neurological: Negative for headaches.     Social History   Tobacco Use  Smoking Status Former Smoker  . Years: 30.00  . Types: Cigarettes  . Last attempt to quit: 12/27/1990  . Years since quitting: 28.1  Smokeless Tobacco Never Used        Objective:    BP Readings from Last 3 Encounters:  02/26/19 136/82  12/08/18 132/84  08/25/18 122/80   Wt Readings from Last 3 Encounters:  02/26/19 225 lb 8 oz (102.3 kg)  12/08/18 221 lb 8 oz (100.5 kg)  08/25/18 221 lb 12 oz (100.6 kg)    BP 136/82   Pulse (!) 59   Temp 98.5 F (36.9 C)   Resp 18   Ht 5' 11.5" (1.816 m)   Wt 225 lb 8 oz (102.3 kg)   SpO2 97%   BMI 31.01 kg/m    Physical Exam Constitutional:      General: He is not in acute  distress.    Appearance: He is well-developed. He is not ill-appearing.  HENT:     Head: Normocephalic and atraumatic.     Right Ear: Tympanic membrane and ear canal normal.     Left Ear: Tympanic membrane and ear canal normal.     Nose: Mucosal edema and rhinorrhea present.     Right Sinus: No maxillary sinus tenderness or frontal sinus tenderness.     Left Sinus: No maxillary sinus tenderness or frontal sinus tenderness.     Mouth/Throat:     Pharynx: Uvula midline. Posterior oropharyngeal erythema present. No oropharyngeal exudate.     Tonsils: Swelling: 0 on the right. 0 on the left.  Neck:     Musculoskeletal: Neck supple.  Cardiovascular:     Rate and Rhythm: Normal rate and regular rhythm.     Heart sounds: No murmur.  Pulmonary:     Effort: Pulmonary effort is normal. No respiratory distress.     Breath sounds: Wheezing present.  Lymphadenopathy:     Cervical: No cervical adenopathy.  Skin:    General: Skin is warm and dry.     Capillary Refill: Capillary refill takes less than 2 seconds.  Neurological:     Mental Status: He is alert.           Assessment & Plan:   Problem List Items Addressed This Visit  None    Visit Diagnoses    Viral URI with cough    -  Primary   Relevant Medications   benzonatate (TESSALON) 100 MG capsule     Likely viral uri. Pt improving. Offered and discussed trial of albuterol but given improvement pt elected to continue symptomatic treatment. Discussed that hx of tobacco use may put him at increased risk with viruses.   Return if symptoms worsen or fail to improve.  Lesleigh Noe, MD

## 2019-02-26 NOTE — Telephone Encounter (Signed)
[facility] Night - Client Nonclinical Telephone Record [facility] Night - Client Client Site [facility] Physician Renford Dills - MD Contact Type Call Who Is Calling Patient / Member / Family / Caregiver Caller Name Jasyah Theurer Caller Phone Number [phone] Patient Name Xavier Esparza Patient DOB 05-Feb-1946 Call Type Message Only Information Provided Reason for Call Request to Schedule Office Appointment Initial Comment He wants to know if he can get an appt today. He has cold or flu Sx for a few days. Additional Comment Call Closed By: Eather Colas Transaction Date/Time: 02/26/2019 7:47:54 AM (ET)

## 2019-02-26 NOTE — Patient Instructions (Signed)
Based on your symptoms, it looks like you have a virus.   Antibiotics are not need for a viral infection but the following will help:   1. Drink plenty of fluids 2. Get lots of rest  Sinus Congestion 1) Neti Pot (Saline rinse) -- 2 times day -- if tolerated 2) Flonase (Store Brand ok) - once daily 3) Over the counter congestion medications  Cough 1) Cough drops can be helpful 2) Nyquil (or nighttime cough medication) 3) Honey is proven to be one of the best cough medications   Sore Throat 1) Honey as above, cough drops 2) Ibuprofen or Aleve can be helpful 3) Salt water Gargles  If you develop fevers (Temperature >100.4), chills, worsening symptoms or symptoms lasting longer than 10 days return to clinic.     If breathing is difficult or wheezing worsens let me know and we can try an inhaler.

## 2019-02-26 NOTE — Telephone Encounter (Signed)
I spoke with pt; since 02/22/19 pt has had non prod cough, chest congestion and no fever. Pt continues with occasional cough and chest congestion. mucines is not effective. Pt scheduled appt to see Dr Einar Pheasant 02/26/19 at 12:20.

## 2019-02-27 ENCOUNTER — Ambulatory Visit: Payer: Medicare Other | Admitting: Family Medicine

## 2019-03-01 ENCOUNTER — Other Ambulatory Visit: Payer: Self-pay | Admitting: Family Medicine

## 2019-03-06 ENCOUNTER — Ambulatory Visit: Payer: Medicare Other | Admitting: Family Medicine

## 2019-03-20 ENCOUNTER — Ambulatory Visit: Payer: Medicare Other | Admitting: Family Medicine

## 2019-04-11 NOTE — Telephone Encounter (Signed)
Pt seen 02/26/19.

## 2019-04-17 ENCOUNTER — Ambulatory Visit (INDEPENDENT_AMBULATORY_CARE_PROVIDER_SITE_OTHER): Payer: Medicare Other | Admitting: Family Medicine

## 2019-04-17 DIAGNOSIS — E7800 Pure hypercholesterolemia, unspecified: Secondary | ICD-10-CM

## 2019-04-17 DIAGNOSIS — C61 Malignant neoplasm of prostate: Secondary | ICD-10-CM

## 2019-04-17 DIAGNOSIS — E119 Type 2 diabetes mellitus without complications: Secondary | ICD-10-CM

## 2019-04-17 NOTE — Progress Notes (Signed)
Interactive audio and video telecommunications were attempted between this provider and patient, however failed, due to patient having technical difficulties OR patient did not have access to video capability.  We continued and completed visit with audio only.   Virtual Visit via Telephone Note  I connected with patient on 04/17/19 at 8:15 by telephone and verified that I am speaking with the correct person using two identifiers.  Location of patient: home  Location of MD: [place] Name of referring provider (if blank then none associated): Names per persons and role in encounter:  MD: Earlyne Iba, Patient: name listed above.    I discussed the limitations, risks, security and privacy concerns of performing an evaluation and management service by telephone and the availability of in person appointments. I also discussed with the patient that there may be a patient responsible charge related to this service. The patient expressed understanding and agreed to proceed.  CC: DM  HPI.  Diabetes:  Using medications without difficulties: yes Hypoglycemic episodes: no Hyperglycemic episodes: no Feet problems: no Blood Sugars averaging: not checked eye exam within last year: yes  Elevated Cholesterol: Using medications without problems: yes Muscle aches: no Diet compliance: yes Exercise:yes  Can come in for fasting labs on 04/30/2019 at 820 AM.  Labs ordered.   Prostate cancer d/w pt.  He has f/u with urology tomorrow AM.  PSA per urology.      Observations/Objective:  nad  Speaking in complete sentences.    Assessment and Plan: DM2.  No change in meds.  Continue as is for now.   Can come in for fasting labs on 04/30/2019 at 820 AM.  Labs ordered.  He agrees with plan.  Continue work on diet and exercise.   HLD. No change in meds.  Continue as is for now.   Can come in for fasting labs on 04/30/2019 at 820 AM.  Labs ordered.  He agrees with plan.  Continue work on diet  and exercise.   Prostate cancer.  He has f/u with urology pending.  I will defer.  He agrees.  Pandemic considerations d/w pt. no issues currently.  Follow Up Instructions: as above.    I discussed the assessment and treatment plan with the patient. The patient was provided an opportunity to ask questions and all were answered. The patient agreed with the plan and demonstrated an understanding of the instructions.   The patient was advised to call back or seek an in-person evaluation if the symptoms worsen or if the condition fails to improve as anticipated.  I provided 18 minutes of non-face-to-face time during this encounter.  Elsie Stain, MD

## 2019-04-19 NOTE — Assessment & Plan Note (Signed)
  HLD. No change in meds.  Continue as is for now.   Can come in for fasting labs on 04/30/2019 at 820 AM.  Labs ordered.  He agrees with plan.  Continue work on diet and exercise.

## 2019-04-19 NOTE — Assessment & Plan Note (Signed)
  Prostate cancer.  He has f/u with urology pending.  I will defer.  He agrees.

## 2019-04-19 NOTE — Assessment & Plan Note (Signed)
DM2.  No change in meds.  Continue as is for now.   Can come in for fasting labs on 04/30/2019 at 820 AM.  Labs ordered.  He agrees with plan.  Continue work on diet and exercise.

## 2019-04-30 ENCOUNTER — Other Ambulatory Visit: Payer: Medicare Other

## 2019-05-11 ENCOUNTER — Other Ambulatory Visit (INDEPENDENT_AMBULATORY_CARE_PROVIDER_SITE_OTHER): Payer: Medicare Other

## 2019-05-11 DIAGNOSIS — E119 Type 2 diabetes mellitus without complications: Secondary | ICD-10-CM | POA: Diagnosis not present

## 2019-05-11 LAB — COMPREHENSIVE METABOLIC PANEL
ALT: 13 U/L (ref 0–53)
AST: 22 U/L (ref 0–37)
Albumin: 3.7 g/dL (ref 3.5–5.2)
Alkaline Phosphatase: 46 U/L (ref 39–117)
BUN: 13 mg/dL (ref 6–23)
CO2: 29 mEq/L (ref 19–32)
Calcium: 9.2 mg/dL (ref 8.4–10.5)
Chloride: 104 mEq/L (ref 96–112)
Creatinine, Ser: 1.43 mg/dL (ref 0.40–1.50)
GFR: 58.48 mL/min — ABNORMAL LOW (ref >60.00)
Glucose, Bld: 103 mg/dL — ABNORMAL HIGH (ref 70–99)
Potassium: 4.2 mEq/L (ref 3.5–5.1)
Sodium: 140 mEq/L (ref 135–145)
Total Bilirubin: 0.5 mg/dL (ref 0.2–1.2)
Total Protein: 6.7 g/dL (ref 6.0–8.3)

## 2019-05-11 LAB — LIPID PANEL
Cholesterol: 128 mg/dL (ref 0–200)
HDL: 41.7 mg/dL (ref >39.00)
LDL Cholesterol: 64 mg/dL (ref 0–99)
NonHDL: 86.24
Total CHOL/HDL Ratio: 3
Triglycerides: 109 mg/dL (ref 0.0–149.0)
VLDL: 21.8 mg/dL (ref 0.0–40.0)

## 2019-05-11 LAB — HEMOGLOBIN A1C: Hgb A1c MFr Bld: 6.2 % (ref 4.6–6.5)

## 2019-05-18 ENCOUNTER — Ambulatory Visit (INDEPENDENT_AMBULATORY_CARE_PROVIDER_SITE_OTHER): Payer: Medicare Other

## 2019-05-18 DIAGNOSIS — Z0000 Encounter for general adult medical examination without abnormal findings: Secondary | ICD-10-CM | POA: Diagnosis not present

## 2019-05-18 NOTE — Patient Instructions (Addendum)
Xavier Esparza , Thank you for taking time to come for your Medicare Wellness Visit. I appreciate your ongoing commitment to your health goals. Please review the following plan we discussed and let me know if I can assist you in the future.   These are the goals we discussed: Goals    . Patient Stated     Starting 05/18/19, I will continue to take medication as prescribed.         This is a list of the screening recommended for you and due dates:  Health Maintenance  Topic Date Due  . Complete foot exam   12/26/2020*  . Urine Protein Check  12/26/2020*  . Tetanus Vaccine  12/26/2020*  . Flu Shot  07/28/2019  . Hemoglobin A1C  11/11/2019  . Eye exam for diabetics  11/15/2019  . Colon Cancer Screening  03/09/2021  .  Hepatitis C: One time screening is recommended by Center for Disease Control  (CDC) for  adults born from [birth year] through 1966.   Completed  . Pneumonia vaccines  Completed  *Topic was postponed. The date shown is not the original due date.   Preventive Care for Adults  A healthy lifestyle and preventive care can promote health and wellness. Preventive health guidelines for adults include the following key practices.  . A routine yearly physical is a good way to check with your health care provider about your health and preventive screening. It is a chance to share any concerns and updates on your health and to receive a thorough exam.  . Visit your dentist for a routine exam and preventive care every 6 months. Brush your teeth twice a day and floss once a day. Good oral hygiene prevents tooth decay and gum disease.  . The frequency of eye exams is based on your age, health, family medical history, use  of contact lenses, and other factors. Follow your health care provider's recommendations for frequency of eye exams.  . Eat a healthy diet. Foods like vegetables, fruits, whole grains, low-fat dairy products, and lean protein foods contain the nutrients you need without too  many calories. Decrease your intake of foods high in solid fats, added sugars, and salt. Eat the right amount of calories for you. Get information about a proper diet from your health care provider, if necessary.  . Regular physical exercise is one of the most important things you can do for your health. Most adults should get at least 150 minutes of moderate-intensity exercise (any activity that increases your heart rate and causes you to sweat) each week. In addition, most adults need muscle-strengthening exercises on 2 or more days a week.  Silver Sneakers may be a benefit available to you. To determine eligibility, you may visit the website: www.silversneakers.com or contact program at 858-219-6205 Mon-Fri between 8AM-8PM.   . Maintain a healthy weight. The body mass index (BMI) is a screening tool to identify possible weight problems. It provides an estimate of body fat based on height and weight. Your health care provider can find your BMI and can help you achieve or maintain a healthy weight.   For adults 20 years and older: ? A BMI below 18.5 is considered underweight. ? A BMI of 18.5 to 24.9 is normal. ? A BMI of 25 to 29.9 is considered overweight. ? A BMI of 30 and above is considered obese.   . Maintain normal blood lipids and cholesterol levels by exercising and minimizing your intake of saturated fat. Eat a balanced  diet with plenty of fruit and vegetables. Blood tests for lipids and cholesterol should begin at age 70 and be repeated every 5 years. If your lipid or cholesterol levels are high, you are over 50, or you are at high risk for heart disease, you may need your cholesterol levels checked more frequently. Ongoing high lipid and cholesterol levels should be treated with medicines if diet and exercise are not working.  . If you smoke, find out from your health care provider how to quit. If you do not use tobacco, please do not start.  . If you choose to drink alcohol, please  do not consume more than 2 drinks per day. One drink is considered to be 12 ounces (355 mL) of beer, 5 ounces (148 mL) of wine, or 1.5 ounces (44 mL) of liquor.  . If you are 63-27 years old, ask your health care provider if you should take aspirin to prevent strokes.  . Use sunscreen. Apply sunscreen liberally and repeatedly throughout the day. You should seek shade when your shadow is shorter than you. Protect yourself by wearing long sleeves, pants, a wide-brimmed hat, and sunglasses year round, whenever you are outdoors.  . Once a month, do a whole body skin exam, using a mirror to look at the skin on your back. Tell your health care provider of new moles, moles that have irregular borders, moles that are larger than a pencil eraser, or moles that have changed in shape or color.

## 2019-05-18 NOTE — Progress Notes (Signed)
Subjective:   Xavier Esparza is a 73 y.o. male who presents for Medicare Annual/Subsequent preventive examination.  Review of Systems: N/A Cardiac Risk Factors include: advanced age (>58men, >67 women);male gender;dyslipidemia     Objective:    Vitals: There were no vitals taken for this visit.  There is no height or weight on file to calculate BMI.  Advanced Directives 05/18/2019 05/11/2018 03/09/2018 09/15/2017 08/08/2017 01/10/2012  Does Patient Have a Medical Advance Directive? Yes No No No No Patient does not have advance directive  Type of Scientist, forensic Power of [place];Living will - - - - -  Copy of [place] in Chart? No - copy requested - - - - -  Would patient like information on creating a medical advance directive? - Yes (MAU/Ambulatory/Procedural Areas - Information given) - No - Patient declined - -    Tobacco Social History   Tobacco Use  Smoking Status Former Smoker  . Years: 30.00  . Types: Cigarettes  . Last attempt to quit: 12/27/1990  . Years since quitting: 28.4  Smokeless Tobacco Never Used     Counseling given: No   Clinical Intake:  Pre-visit preparation completed: Yes  Pain : No/denies pain Pain Score: 0-No pain     Nutritional Status: BMI > 30  Obese Nutritional Risks: None Diabetes: No  How often do you need to have someone help you when you read instructions, pamphlets, or other written materials from your doctor or pharmacy?: 1 - Never What is the last grade level you completed in school?: Associate degree  Interpreter Needed?: No  Comments: pt lives with spouse Information entered by :: LPinson, LPN  Past Medical History:  Diagnosis Date  . Adopted   . Cataract    resolved with surgery  . Diabetes mellitus    PT WAS TAKING METFORMIN BUT PCP TOOK PT OFF DUE TO MOST RECENT AIC OF 6.4 ON 07-2017  . Dyslipidemia   . Erectile dysfunction   . Herpes labialis    Upper Lip  . Hyperlipidemia   . Male  hypogonadism   . Prostate cancer ([place])   . Renal stones   . Thyroid nodule    Right   Past Surgical History:  Procedure Laterality Date  . CATARACT EXTRACTION, BILATERAL  2017   Dr. Katy Fitch  . COLONOSCOPY WITH PROPOFOL N/A 03/09/2018   Procedure: COLONOSCOPY WITH PROPOFOL;  Surgeon: Lin Landsman, MD;  Location: [facility] ENDOSCOPY;  Service: Gastroenterology;  Laterality: N/A;  . CYSTOSCOPY W/ URETERAL STENT PLACEMENT  08/09/2017   Procedure: CYSTOSCOPY WITH RETROGRADE PYELOGRAM/URETERAL STENT PLACEMENT;  Surgeon: Royston Cowper, MD;  Location: [facility] ORS;  Service: Urology;;  . EXTRACORPOREAL SHOCK WAVE LITHOTRIPSY Left 09/15/2017   Procedure: EXTRACORPOREAL SHOCK WAVE LITHOTRIPSY (ESWL);  Surgeon: Royston Cowper, MD;  Location: [facility] ORS;  Service: Urology;  Laterality: Left;  . EYE SURGERY Bilateral 2016   cataract extractions  . GREEN LIGHT LASER TURP (TRANSURETHRAL RESECTION OF PROSTATE N/A 04/20/2016   Procedure: GREEN LIGHT LASER TURP (TRANSURETHRAL RESECTION OF PROSTATE;  Surgeon: Royston Cowper, MD;  Location: [facility] ORS;  Service: Urology;  Laterality: N/A;  . INGUINAL HERNIA REPAIR  01/11/2012   Procedure: HERNIA REPAIR INGUINAL ADULT;  Surgeon: Pedro Earls, MD;  Location: [place];  Service: General;  Laterality: Right;  Open right inguinal hernia repair with mesh  . RADIOACTIVE SEED IMPLANT     PROSTATE  . URETEROSCOPY WITH HOLMIUM LASER LITHOTRIPSY Right 08/09/2017  Procedure: URETEROSCOPY WITH HOLMIUM LASER LITHOTRIPSY;  Surgeon: Royston Cowper, MD;  Location: [facility] ORS;  Service: Urology;  Laterality: Right;   Family History  Adopted: Yes   Social History   Socioeconomic History  . Marital status: Married    Spouse name: Not on file  . Number of children: Not on file  . Years of education: Not on file  . Highest education level: Not on file  Occupational History  . Not on file  Social Needs  . Financial resource strain: Not on file  . Food  insecurity:    Worry: Not on file    Inability: Not on file  . Transportation needs:    Medical: Not on file    Non-medical: Not on file  Tobacco Use  . Smoking status: Former Smoker    Years: 30.00    Types: Cigarettes    Last attempt to quit: 12/27/1990    Years since quitting: 28.4  . Smokeless tobacco: Never Used  Substance and Sexual Activity  . Alcohol use: Yes    Comment: Rare Socially  . Drug use: No  . Sexual activity: Yes  Lifestyle  . Physical activity:    Days per week: Not on file    Minutes per session: Not on file  . Stress: Not on file  Relationships  . Social connections:    Talks on phone: Not on file    Gets together: Not on file    Attends religious service: Not on file    Active member of club or organization: Not on file    Attends meetings of clubs or organizations: Not on file    Relationship status: Not on file  Other Topics Concern  . Not on file  Social History Narrative   From [place]   Married 24-Mar-1970   2 children alive as of 2017/03/24, 1 biological child and one stepchild   One child died 03-24-17    Outpatient Encounter Medications as of 05/18/2019  Medication Sig  . acyclovir (ZOVIRAX) 800 MG tablet TAKE 1 TABLET BY MOUTH ONCE DAILY  . Calcium Carbonate (CALCIUM 600 PO) Take 2 tablets by mouth daily.  [name] GLUCOSAMINE HCL PO Take 1 tablet by mouth daily.   . Magnesium 500 MG TABS Take 1 tablet by mouth at bedtime.  . metFORMIN (GLUCOPHAGE) 500 MG tablet TAKE 1 TABLET BY MOUTH ONCE DAILY (Patient taking differently: daily. )  . Multiple Vitamin (MULTIVITAMIN) tablet Take 1 tablet by mouth daily. Reported on 01/07/2016  . Omega-3 Fatty Acids ([facility] FISH OIL PO) Take 2 tablets by mouth daily.   . Red Yeast Rice 600 MG TABS Take 2 tablets (1,200 mg total) every other day by mouth. Alternate with crestor  . rosuvastatin (CRESTOR) 20 MG tablet TAKE 1 TABLET BY MOUTH EVERY OTHER DAY IN THE MORNING   No  facility-administered encounter medications on file as of 05/18/2019.     Activities of Daily Living In your present state of health, do you have any difficulty performing the following activities: 05/18/2019  Hearing? N  Vision? N  Difficulty concentrating or making decisions? N  Walking or climbing stairs? N  Dressing or bathing? N  Doing errands, shopping? N  Preparing Food and eating ? N  Using the Toilet? N  In the past six months, have you accidently leaked urine? N  Do you have problems with loss of bowel control? N  Managing your  Medications? N  Managing your Finances? N  Housekeeping or managing your Housekeeping? N  Some recent data might be hidden    Patient Care Team: Tonia Ghent, MD as PCP - General (Family Medicine)   Assessment:   This is a routine wellness examination for [place].  Vision Screening Comments: Vision exam in Nov 2019 with Dr. Clent Jacks   Exercise Activities and Dietary recommendations Current Exercise Habits: Home exercise routine, Type of exercise: Other - see comments;strength training/weights;walking(stationary bike), Time (Minutes): 60, Frequency (Times/Week): 5, Weekly Exercise (Minutes/Week): 300, Intensity: Moderate, Exercise limited by: None identified  Goals    . Patient Stated     Starting 05/18/19, I will continue to take medication as prescribed.         Fall Risk Fall Risk  05/18/2019 05/11/2018 08/02/2017 01/07/2016 11/08/2014  Falls in the past year? 0 No No No No   Depression Screen PHQ 2/9 Scores 05/18/2019 05/11/2018 08/02/2017 01/07/2016  PHQ - 2 Score 0 0 0 0  PHQ- 9 Score 0 0 - -    Cognitive Function MMSE - Mini Mental State Exam 05/18/2019 05/11/2018  Orientation to time 5 5  Orientation to Place 5 5  Registration 3 3  Attention/ Calculation 0 0  Recall 3 3  Language- name 2 objects 0 0  Language- repeat 1 1  Language- follow 3 step command 0 3  Language- read & follow direction 0 0  Write a sentence 0 0  Copy  design 0 0  Total score 17 20     PLEASE NOTE: A Mini-Cog screen was completed. Maximum score is 17. A value of 0 denotes this part of [place]stein MMSE was not completed or the patient failed this part of the Mini-Cog screening.   Mini-Cog Screening Orientation to Time - Max 5 pts Orientation to Place - Max 5 pts Registration - Max 3 pts Recall - Max 3 pts Language Repeat - Max 1 pts      Immunization History  Administered Date(s) Administered  . Influenza Split 09/21/2011, 09/21/2012  . Influenza Whole 09/14/2010  . Influenza, High Dose Seasonal PF 11/30/2016  . Influenza,inj,Quad PF,6+ Mos 09/26/2013, 11/08/2014, 11/28/2015, 10/13/2017, 10/12/2018  . Pneumococcal Conjugate-13 09/26/2013  . Pneumococcal Polysaccharide-23 09/17/2010  . Td 04/14/2007  . Zoster 10/05/2011   Screening Tests Health Maintenance  Topic Date Due  . FOOT EXAM  12/26/2020 (Originally 11/11/2018)  . URINE MICROALBUMIN  12/26/2020 (Originally 02/09/2019)  . TETANUS/TDAP  12/26/2020 (Originally 04/13/2017)  . INFLUENZA VACCINE  07/28/2019  . HEMOGLOBIN A1C  11/11/2019  . OPHTHALMOLOGY EXAM  11/15/2019  . COLONOSCOPY  03/09/2021  . Hepatitis C Screening  Completed  . PNA vac Low Risk Adult  Completed       Plan:     I have personally reviewed, addressed, and noted the following in the patient's chart:  A. Medical and social history B. Use of alcohol, tobacco or illicit drugs  C. Current medications and supplements D. Functional ability and status E.  Nutritional status F.  Physical activity G. Advance directives H. List of other physicians I.  Hospitalizations, surgeries, and ER visits in previous 12 months J.  Vitals (unless it is a telemedicine encounter) K. Screenings to include cognitive, depression, hearing, vision (NOTE: hearing and vision screenings not completed in telemedicine encounter) L. Referrals and appointments   In addition, I have reviewed and discussed with patient certain  preventive protocols, quality metrics, and best practice recommendations. A written personalized care plan for  preventive services and recommendations were provided to patient.  With patient's permission, we connected on 05/18/19 at 10:00 AM EDT by a video enabled telemedicine application. Two patient identifiers were used to ensure the encounter occurred with the correct person.    Patient was in home and writer was in office.   Signed,   Lindell Noe, MHA, BS, LPN Health Coach

## 2019-05-18 NOTE — Progress Notes (Signed)
PCP notes:   Health maintenance:  Foot exam - PCP follow-up requested Microalbumin - PCP follow-up requested Tetanus vaccine - postponed/insurance  Abnormal screenings:   None  Patient concerns:   Patient wants to schedule an appt to have ears examined for wax buildup and to discuss a referral for an audiologist due to missing high pitch tone in both ears in 2019.   Nurse concerns:  None  Next PCP appt:   05/22/19 @ 0900  I reviewed health advisor's note, was available for consultation on the day of service listed in this note, and agree with documentation and plan. Elsie Stain, MD.

## 2019-05-22 ENCOUNTER — Ambulatory Visit (INDEPENDENT_AMBULATORY_CARE_PROVIDER_SITE_OTHER): Payer: Medicare Other | Admitting: Family Medicine

## 2019-05-22 ENCOUNTER — Telehealth: Payer: Self-pay | Admitting: Family Medicine

## 2019-05-22 ENCOUNTER — Encounter: Payer: Self-pay | Admitting: Family Medicine

## 2019-05-22 VITALS — Ht 71.5 in | Wt 220.0 lb

## 2019-05-22 DIAGNOSIS — E7800 Pure hypercholesterolemia, unspecified: Secondary | ICD-10-CM | POA: Diagnosis not present

## 2019-05-22 DIAGNOSIS — C61 Malignant neoplasm of prostate: Secondary | ICD-10-CM | POA: Diagnosis not present

## 2019-05-22 DIAGNOSIS — Z7189 Other specified counseling: Secondary | ICD-10-CM | POA: Diagnosis not present

## 2019-05-22 DIAGNOSIS — E119 Type 2 diabetes mellitus without complications: Secondary | ICD-10-CM

## 2019-05-22 DIAGNOSIS — Z0000 Encounter for general adult medical examination without abnormal findings: Secondary | ICD-10-CM

## 2019-05-22 MED ORDER — METFORMIN HCL 500 MG PO TABS: 500.0000 mg | ORAL_TABLET | Freq: Every day | ORAL | 3 refills | Status: DC

## 2019-05-22 MED ORDER — METFORMIN HCL 500 MG PO TABS: 500.0000 mg | ORAL_TABLET | Freq: Every day | ORAL | Status: DC

## 2019-05-22 NOTE — Progress Notes (Signed)
Virtual visit completed through WebEx or similar program Patient location: home  Provider location: [place] at [facility], office   Limitations and rationale for visit method d/w patient.  Patient agreed to proceed.   CC: follow up.   HPI:  Pandemic considerations d/w pt.  He is trying to work from home as much as possible.    Tetanus d/w pt Flu 2019 PNA UTD Shingles d/w pt.  Colonoscopy 2019.  PSA per urology. I'll defer, dw pt.  He has f/u with them in October.   Wife designated if patient were incapacitated.  He would have [employer] designated next if his wife were incapacitated.    Diabetes:  Using medications without difficulties:yes Hypoglycemic episodes:no Hyperglycemic episodes: no Feet problems: no Blood Sugars averaging: not checked.  eye exam within last year: yes A1c slightly up but still controlled, d/w pt.   Elevated Cholesterol: Using medications without problems: yes Muscle aches: no Diet compliance: yes Exercise: yes Labs d/w pt.  Lipids are reasonable.    Prostate cancer.  See above.  I'll defer to urology.  He agrees.  He has OV scheduled for tomorrow.    Patient wants to schedule an appt to have ears examined for wax buildup and to discuss a referral for an audiologist due to missing high pitch tone in both ears in 2019.  He wouldn't want hearing aids at this point.  Discussed home irrigation and prn valsalva.    Meds and allergies reviewed.   ROS: Per HPI unless specifically indicated in ROS section   NAD Speech wnl  A/P: Health maintenance. Tetanus d/w pt Flu 2019 PNA UTD Shingles d/w pt.  Colonoscopy 2019.  PSA per urology. I'll defer, dw pt.  He has f/u with them in October.   Wife designated if patient were incapacitated.  He would have [employer] designated next if his wife were incapacitated.    Diabetes:  Continue metformin for now.  He felt better about continuing with metformin.  We talked about hypoglycemia cautions A1c  slightly up but still controlled, d/w pt.  Plan on recheck in about 3 months with nonfasting labs at the visit.  We can recheck microalbumin with next set of labs.  Elevated Cholesterol: Labs d/w pt.  Lipids are reasonable.  Continue as is.  Continue work on diet and exercise.  Prostate cancer.  See above.  I'll defer to urology.  He agrees.  He has OV scheduled for tomorrow.  I will await those notes.  Patient wants to schedule an appt to have ears examined for wax buildup and to discuss a referral for an audiologist due to missing high pitch tone in both ears in 2019.  He wouldn't want hearing aids at this point.  Discussed home irrigation and prn valsalva.  We will get him scheduled in the clinic.

## 2019-05-22 NOTE — Telephone Encounter (Signed)
Left message with wife to call us back and schedule appointments below.     Tonia Ghent, MD  Genella Rife H        Please schedule this gentleman here at [facility] in about 1 week so I can check his ears in clinic.    And he needs to be scheduled for about 3 months out with nonfasting labs at a DM2 clinic visit here. We can do labs at the clinic visit.   Thanks.   Brigitte Pulse

## 2019-05-23 NOTE — Assessment & Plan Note (Signed)
  Wife designated if patient were incapacitated.  He would have Sheryl Eller designated next if his wife were incapacitated.   °

## 2019-05-23 NOTE — Assessment & Plan Note (Addendum)
Continue metformin for now.  He felt better about continuing with metformin.  We talked about hypoglycemia cautions A1c slightly up but still controlled, d/w pt.  Plan on recheck in about 3 months with nonfasting labs at the visit. We can recheck microalbumin with next set of labs. >25 minutes spent in face to face time with patient, >50% spent in counselling or coordination of care

## 2019-05-23 NOTE — Assessment & Plan Note (Signed)
Health maintenance. Tetanus d/w pt Flu 2019 PNA UTD Shingles d/w pt.  Colonoscopy 2019.  PSA per urology. I'll defer, dw pt.  He has f/u with them in October.   Wife designated if patient were incapacitated.  He would have [employer] designated next if his wife were incapacitated.

## 2019-05-23 NOTE — Assessment & Plan Note (Signed)
Labs d/w pt.  Lipids are reasonable.  Continue as is.  Continue work on diet and exercise.

## 2019-05-23 NOTE — Telephone Encounter (Addendum)
Noted. Thanks.

## 2019-05-23 NOTE — Assessment & Plan Note (Signed)
I'll defer to urology.  He agrees.  He has OV scheduled for tomorrow.  I will await those notes.

## 2019-06-18 ENCOUNTER — Telehealth: Payer: Self-pay | Admitting: Family Medicine

## 2019-06-18 ENCOUNTER — Other Ambulatory Visit: Payer: Self-pay | Admitting: *Deleted

## 2019-06-18 MED ORDER — ROSUVASTATIN CALCIUM 20 MG PO TABS: ORAL_TABLET | ORAL | 3 refills | Status: DC

## 2019-06-18 NOTE — Telephone Encounter (Signed)
Best number [phone]   Pt needs refill on  [facility]  stated they have not heard from office  [facility] [address]  Pt is out of meds

## 2019-06-18 NOTE — Telephone Encounter (Signed)
RF sent to pharmacy, pt advised.

## 2019-08-23 ENCOUNTER — Ambulatory Visit (INDEPENDENT_AMBULATORY_CARE_PROVIDER_SITE_OTHER): Payer: Medicare Other

## 2019-08-23 DIAGNOSIS — Z23 Encounter for immunization: Secondary | ICD-10-CM | POA: Diagnosis not present

## 2019-09-11 ENCOUNTER — Other Ambulatory Visit: Payer: Self-pay | Admitting: Family Medicine

## 2019-12-17 ENCOUNTER — Other Ambulatory Visit: Payer: Self-pay | Admitting: Family Medicine

## 2019-12-17 NOTE — Telephone Encounter (Signed)
Electronic refill request. Acyclovir Last office visit:   05/22/2019 Last Filled:    30 tablet 12 11/14/2018  Please advise.

## 2019-12-18 MED ORDER — ACYCLOVIR 800 MG PO TABS: 800.0000 mg | ORAL_TABLET | Freq: Every day | ORAL | 12 refills | Status: DC

## 2019-12-18 NOTE — Telephone Encounter (Signed)
Sent. Thanks.  Initially sent with 0 RF, then resent with 12 RF.

## 2020-01-22 ENCOUNTER — Telehealth: Payer: Self-pay

## 2020-01-22 ENCOUNTER — Other Ambulatory Visit: Payer: Self-pay | Admitting: *Deleted

## 2020-01-22 MED ORDER — METFORMIN HCL 500 MG PO TABS: 500.0000 mg | ORAL_TABLET | Freq: Every day | ORAL | 1 refills | Status: DC

## 2020-01-22 MED ORDER — ROSUVASTATIN CALCIUM 20 MG PO TABS: ORAL_TABLET | ORAL | 1 refills | Status: DC

## 2020-01-22 MED ORDER — ROSUVASTATIN CALCIUM 20 MG PO TABS: ORAL_TABLET | ORAL | 0 refills | Status: DC

## 2020-01-22 MED ORDER — ACYCLOVIR 800 MG PO TABS: 800.0000 mg | ORAL_TABLET | Freq: Every day | ORAL | 1 refills | Status: DC

## 2020-01-22 NOTE — Telephone Encounter (Addendum)
Pt is now using [facility] for all of his maintenance drugs. He needs acyclovir, metformin, and rosuvastatin sent to them.   HT at [place] rosuvastatin 30 day. He will do [facility]. I sent that refill.  His last OV was 04/2019. Was not sure how many refills he would want to send to mail order.

## 2020-01-22 NOTE — Telephone Encounter (Signed)
Refills sent electronically

## 2020-05-15 ENCOUNTER — Other Ambulatory Visit: Payer: Self-pay | Admitting: Family Medicine

## 2020-05-15 DIAGNOSIS — E119 Type 2 diabetes mellitus without complications: Secondary | ICD-10-CM

## 2020-05-27 ENCOUNTER — Other Ambulatory Visit: Payer: Self-pay

## 2020-05-27 ENCOUNTER — Other Ambulatory Visit (INDEPENDENT_AMBULATORY_CARE_PROVIDER_SITE_OTHER): Payer: Medicare Other

## 2020-05-27 DIAGNOSIS — E119 Type 2 diabetes mellitus without complications: Secondary | ICD-10-CM

## 2020-05-27 LAB — COMPREHENSIVE METABOLIC PANEL
ALT: 17 U/L (ref 0–53)
AST: 25 U/L (ref 0–37)
Albumin: 4.1 g/dL (ref 3.5–5.2)
Alkaline Phosphatase: 41 U/L (ref 39–117)
BUN: 16 mg/dL (ref 6–23)
CO2: 31 mEq/L (ref 19–32)
Calcium: 9.3 mg/dL (ref 8.4–10.5)
Chloride: 104 mEq/L (ref 96–112)
Creatinine, Ser: 1.18 mg/dL (ref 0.40–1.50)
GFR: 72.79 mL/min (ref >60.00)
Glucose, Bld: 101 mg/dL — ABNORMAL HIGH (ref 70–99)
Potassium: 4.1 mEq/L (ref 3.5–5.1)
Sodium: 139 mEq/L (ref 135–145)
Total Bilirubin: 0.5 mg/dL (ref 0.2–1.2)
Total Protein: 7 g/dL (ref 6.0–8.3)

## 2020-05-27 LAB — MICROALBUMIN / CREATININE URINE RATIO
Creatinine,U: 153.9 mg/dL
Microalb Creat Ratio: 1.8 mg/g (ref 0.0–30.0)
Microalb, Ur: 2.8 mg/dL — ABNORMAL HIGH (ref 0.0–1.9)

## 2020-05-27 LAB — LIPID PANEL
Cholesterol: 170 mg/dL (ref 0–200)
HDL: 48.5 mg/dL (ref >39.00)
LDL Cholesterol: 96 mg/dL (ref 0–99)
NonHDL: 121.49
Total CHOL/HDL Ratio: 4
Triglycerides: 125 mg/dL (ref 0.0–149.0)
VLDL: 25 mg/dL (ref 0.0–40.0)

## 2020-05-27 LAB — HEMOGLOBIN A1C: Hgb A1c MFr Bld: 6.2 % (ref 4.6–6.5)

## 2020-05-28 ENCOUNTER — Ambulatory Visit (INDEPENDENT_AMBULATORY_CARE_PROVIDER_SITE_OTHER): Payer: Medicare Other

## 2020-05-28 DIAGNOSIS — Z0000 Encounter for general adult medical examination without abnormal findings: Secondary | ICD-10-CM

## 2020-05-28 NOTE — Progress Notes (Signed)
Subjective:   Xavier Esparza is a 74 y.o. male who presents for Medicare Annual/Subsequent preventive examination.  Review of Systems: N/A   I connected with the patient today by telephone and verified that I am speaking with the correct person using two identifiers. Location patient: home Location nurse: work Persons participating in the virtual visit: patient, Marine scientist.   I discussed the limitations, risks, security and privacy concerns of performing an evaluation and management service by telephone and the availability of in person appointments. I also discussed with the patient that there may be a patient responsible charge related to this service. The patient expressed understanding and verbally consented to this telephonic visit.    Interactive audio and video telecommunications were attempted between this nurse and patient, however failed, due to patient having technical difficulties OR patient did not have access to video capability.  We continued and completed visit with audio only.     Cardiac Risk Factors include: advanced age (>49men, >12 women);male gender;diabetes mellitus;Other (see comment), Risk factor comments: hypercholesterolemia     Objective:    Vitals: There were no vitals taken for this visit.  There is no height or weight on file to calculate BMI.  Advanced Directives 05/28/2020 05/18/2019 05/11/2018 03/09/2018 09/15/2017 08/08/2017 01/10/2012  Does Patient Have a Medical Advance Directive? No Yes No No No No Patient does not have advance directive  Type of Advance Directive - [place];Living will - - - - -  Copy of [place] in Chart? - No - copy requested - - - - -  Would patient like information on creating a medical advance directive? No - Patient declined - Yes (MAU/Ambulatory/Procedural Areas - Information given) - No - Patient declined - -    Tobacco Social History   Tobacco Use  Smoking Status Former Smoker  . Years:  30.00  . Types: Cigarettes  . Quit date: 12/27/1990  . Years since quitting: 29.4  Smokeless Tobacco Never Used     Counseling given: Not Answered   Clinical Intake:  Pre-visit preparation completed: Yes  Pain : No/denies pain     Nutritional Risks: None Diabetes: Yes CBG done?: No Did pt. bring in CBG monitor from home?: No  How often do you need to have someone help you when you read instructions, pamphlets, or other written materials from your doctor or pharmacy?: 1 - Never What is the last grade level you completed in school?: associates  Interpreter Needed?: No  Information entered by :: CJohnson, LPN  Past Medical History:  Diagnosis Date  . Adopted   . Cataract    resolved with surgery  . Diabetes mellitus   . Dyslipidemia   . Erectile dysfunction   . Herpes labialis    Upper Lip  . Hyperlipidemia   . Male hypogonadism   . Prostate cancer ([place])   . Renal stones   . Thyroid nodule    Right   Past Surgical History:  Procedure Laterality Date  . CATARACT EXTRACTION, BILATERAL  2017   Dr. Katy Fitch  . COLONOSCOPY WITH PROPOFOL N/A 03/09/2018   Procedure: COLONOSCOPY WITH PROPOFOL;  Surgeon: Lin Landsman, MD;  Location: [facility] ENDOSCOPY;  Service: Gastroenterology;  Laterality: N/A;  . CYSTOSCOPY W/ URETERAL STENT PLACEMENT  08/09/2017   Procedure: CYSTOSCOPY WITH RETROGRADE PYELOGRAM/URETERAL STENT PLACEMENT;  Surgeon: Royston Cowper, MD;  Location: [facility] ORS;  Service: Urology;;  . EXTRACORPOREAL SHOCK WAVE LITHOTRIPSY Left 09/15/2017   Procedure: EXTRACORPOREAL SHOCK WAVE  LITHOTRIPSY (ESWL);  Surgeon: Royston Cowper, MD;  Location: [facility] ORS;  Service: Urology;  Laterality: Left;  . EYE SURGERY Bilateral 2015/03/31   cataract extractions  . GREEN LIGHT LASER TURP (TRANSURETHRAL RESECTION OF PROSTATE N/A 04/20/2016   Procedure: GREEN LIGHT LASER TURP (TRANSURETHRAL RESECTION OF PROSTATE;  Surgeon: Royston Cowper, MD;  Location: [facility] ORS;  Service: Urology;   Laterality: N/A;  . INGUINAL HERNIA REPAIR  01/11/2012   Procedure: HERNIA REPAIR INGUINAL ADULT;  Surgeon: Pedro Earls, MD;  Location: [place];  Service: General;  Laterality: Right;  Open right inguinal hernia repair with mesh  . RADIOACTIVE SEED IMPLANT     PROSTATE  . URETEROSCOPY WITH HOLMIUM LASER LITHOTRIPSY Right 08/09/2017   Procedure: URETEROSCOPY WITH HOLMIUM LASER LITHOTRIPSY;  Surgeon: Royston Cowper, MD;  Location: [facility] ORS;  Service: Urology;  Laterality: Right;   Family History  Adopted: Yes   Social History   Socioeconomic History  . Marital status: Married    Spouse name: Not on file  . Number of children: Not on file  . Years of education: Not on file  . Highest education level: Not on file  Occupational History  . Not on file  Tobacco Use  . Smoking status: Former Smoker    Years: 30.00    Types: Cigarettes    Quit date: 12/27/1990    Years since quitting: 29.4  . Smokeless tobacco: Never Used  Substance and Sexual Activity  . Alcohol use: Yes    Comment: Rare Socially  . Drug use: No  . Sexual activity: Yes  Other Topics Concern  . Not on file  Social History Narrative   From [place] then [employer]   Married 1971   2 children alive as of 30-Mar-2017, 1 biological child and one stepchild   One child died March 30, 2017   Social Determinants of Health   Financial Resource Strain:   . Difficulty of Paying Living Expenses:   Food Insecurity:   . Worried About Charity fundraiser in the Last Year:   . Arboriculturist in the Last Year:   Transportation Needs:   . Film/video editor (Medical):   [name] Lack of Transportation (Non-Medical):   Physical Activity:   . Days of Exercise per Week:   . Minutes of Exercise per Session:   Stress:   . Feeling of Stress :   Social Connections:   . Frequency of Communication with Friends and Family:   . Frequency of Social Gatherings with Friends and Family:   .  Attends Religious Services:   . Active Member of Clubs or Organizations:   . Attends Archivist Meetings:   [name] Marital Status:     Outpatient Encounter Medications as of 05/28/2020  Medication Sig  . acyclovir (ZOVIRAX) 800 MG tablet Take 1 tablet (800 mg total) by mouth daily.  . Calcium Carbonate (CALCIUM 600 PO) Take 2 tablets by mouth daily.  [name] GLUCOSAMINE HCL PO Take 1 tablet by mouth daily.   . Magnesium 500 MG TABS Take 1 tablet by mouth at bedtime.  . metFORMIN (GLUCOPHAGE) 500 MG tablet Take 1 tablet (500 mg total) by mouth daily.  . Multiple Vitamin (MULTIVITAMIN) tablet Take 1 tablet by mouth daily. Reported on 01/07/2016  . Omega-3 Fatty Acids ([employer] FISH OIL PO) Take 2 tablets by mouth daily.   . Red Yeast Rice 600 MG TABS Take  2 tablets (1,200 mg total) every other day by mouth. Alternate with crestor  . rosuvastatin (CRESTOR) 20 MG tablet TAKE 1 TABLET BY MOUTH EVERY OTHER DAY IN THE MORNING   No facility-administered encounter medications on file as of 05/28/2020.    Activities of Daily Living In your present state of health, do you have any difficulty performing the following activities: 05/28/2020  Hearing? N  Vision? N  Difficulty concentrating or making decisions? N  Walking or climbing stairs? N  Dressing or bathing? N  Doing errands, shopping? N  Preparing Food and eating ? N  Using the Toilet? N  In the past six months, have you accidently leaked urine? N  Do you have problems with loss of bowel control? N  Managing your Medications? N  Managing your Finances? N  Housekeeping or managing your Housekeeping? N  Some recent data might be hidden    Patient Care Team: Tonia Ghent, MD as PCP - General (Family Medicine)   Assessment:   This is a routine wellness examination for Kavian.  Exercise Activities and Dietary recommendations Current Exercise Habits: Home exercise routine, Type of exercise: walking;strength training/weights, Time (Minutes): 45,  Frequency (Times/Week): 6, Weekly Exercise (Minutes/Week): 270, Intensity: Moderate, Exercise limited by: None identified  Goals    . Patient Stated     Starting 05/18/19, I will continue to take medication as prescribed.      . Patient Stated     05/28/2020, I will continue to power walk and lift weights 6 days a week for 45 minutes.        Fall Risk Fall Risk  05/28/2020 05/18/2019 05/11/2018 08/02/2017 01/07/2016  Falls in the past year? 0 0 No No No  Number falls in past yr: 0 - - - -  Injury with Fall? 0 - - - -  Risk for fall due to : Medication side effect - - - -  Follow up Falls evaluation completed;Falls prevention discussed - - - -   Is the patient's home free of loose throw rugs in walkways, pet beds, electrical cords, etc?   yes      Grab bars in the bathroom? no      Handrails on the stairs?   yes      Adequate lighting?   yes  Timed Get Up and Go Performed: N/A  Depression Screen PHQ 2/9 Scores 05/28/2020 05/18/2019 05/11/2018 08/02/2017  PHQ - 2 Score 0 0 0 0  PHQ- 9 Score 0 0 0 -    Cognitive Function MMSE - Mini Mental State Exam 05/28/2020 05/18/2019 05/11/2018  Orientation to time 5 5 5   Orientation to Place 5 5 5   Registration 3 3 3   Attention/ Calculation 5 0 0  Recall 3 3 3   Language- name 2 objects - 0 0  Language- repeat 1 1 1   Language- follow 3 step command - 0 3  Language- read & follow direction - 0 0  Write a sentence - 0 0  Copy design - 0 0  Total score - 17 20  Mini Cog  Mini-Cog screen was completed. Maximum score is 22. A value of 0 denotes this part of the MMSE was not completed or the patient failed this part of the Mini-Cog screening.       Immunization History  Administered Date(s) Administered  . Fluad Quad(high Dose 65+) 08/23/2019  . Influenza Split 09/21/2011, 09/21/2012  . Influenza Whole 09/14/2010  . Influenza, High Dose Seasonal PF 11/30/2016  .  Influenza,inj,Quad PF,6+ Mos 09/26/2013, 11/08/2014, 11/28/2015, 10/13/2017,  10/12/2018  . Pneumococcal Conjugate-13 09/26/2013  . Pneumococcal Polysaccharide-23 09/17/2010  . Td 04/14/2007  . Zoster 10/05/2011    Qualifies for Shingles Vaccine: yes   Screening Tests Health Maintenance  Topic Date Due  . COVID-19 Vaccine (1) Never done  . OPHTHALMOLOGY EXAM  11/15/2019  . FOOT EXAM  12/26/2020 (Originally 11/11/2018)  . TETANUS/TDAP  12/26/2020 (Originally 04/13/2017)  . INFLUENZA VACCINE  07/27/2020  . HEMOGLOBIN A1C  11/26/2020  . COLONOSCOPY  03/09/2021  . URINE MICROALBUMIN  05/27/2021  . Hepatitis C Screening  Completed  . PNA vac Low Risk Adult  Completed   Cancer Screenings: Lung: Low Dose CT Chest recommended if Age 28-80 years, 30 pack-year currently smoking OR have quit w/in 15 years. Patient does not qualify. Colorectal: completed 03/09/2018  Additional Screenings:  Hepatitis C Screening: 08/02/2017      Plan:   Patient will continue to power walk and lift weights 6 days a week for 45 minutes.   I have personally reviewed and noted the following in the patient's chart:   . Medical and social history . Use of alcohol, tobacco or illicit drugs  . Current medications and supplements . Functional ability and status . Nutritional status . Physical activity . Advanced directives . List of other physicians . Hospitalizations, surgeries, and ER visits in previous 12 months . Vitals . Screenings to include cognitive, depression, and falls . Referrals and appointments  In addition, I have reviewed and discussed with patient certain preventive protocols, quality metrics, and best practice recommendations. A written personalized care plan for preventive services as well as general preventive health recommendations were provided to patient.     Kayceon, Oki, LPN  07/04/9783

## 2020-05-28 NOTE — Progress Notes (Signed)
PCP notes:  Health Maintenance: Eye exam- scheduled for November 2021   Abnormal Screenings: none   Patient concerns: Needs ears flushed- has wax buildup Has a bump on his abdomen   Nurse concerns: none   Next PCP appt.: 06/03/2020 @ 8:15 am

## 2020-05-28 NOTE — Patient Instructions (Signed)
Mr. Xavier Esparza , Thank you for taking time to come for your Medicare Wellness Visit. I appreciate your ongoing commitment to your health goals. Please review the following plan we discussed and let me know if I can assist you in the future.   Screening recommendations/referrals: Colonoscopy: Up to date, completed 03/09/2018 Recommended yearly ophthalmology/optometry visit for glaucoma screening and checkup Recommended yearly dental visit for hygiene and checkup  Vaccinations: Influenza vaccine: Up to date, completed 08/23/2019 Pneumococcal vaccine: Completed series Tdap vaccine: decline Shingles vaccine: discussed    Advanced directives: Advance directive discussed with you today. Even though you declined this today please call our office should you change your mind and we can give you the proper paperwork for you to fill out.   Conditions/risks identified: diabetes, hypercholesterolemia  Next appointment: 06/03/2020 @ 8:15 am   Preventive Care 65 Years and Older, Male Preventive care refers to lifestyle choices and visits with your health care provider that can promote health and wellness. What does preventive care include?  A yearly physical exam. This is also called an annual well check.  Dental exams once or twice a year.  Routine eye exams. Ask your health care provider how often you should have your eyes checked.  Personal lifestyle choices, including:  Daily care of your teeth and gums.  Regular physical activity.  Eating a healthy diet.  Avoiding tobacco and drug use.  Limiting alcohol use.  Practicing safe sex.  Taking low doses of aspirin every day.  Taking vitamin and mineral supplements as recommended by your health care provider. What happens during an annual well check? The services and screenings done by your health care provider during your annual well check will depend on your age, overall health, lifestyle risk factors, and family history of  disease. Counseling  Your health care provider may ask you questions about your:  Alcohol use.  Tobacco use.  Drug use.  Emotional well-being.  Home and relationship well-being.  Sexual activity.  Eating habits.  History of falls.  Memory and ability to understand (cognition).  Work and work Statistician. Screening  You may have the following tests or measurements:  Height, weight, and BMI.  Blood pressure.  Lipid and cholesterol levels. These may be checked every 5 years, or more frequently if you are over 47 years old.  Skin check.  Lung cancer screening. You may have this screening every year starting at age 64 if you have a 30-pack-year history of smoking and currently smoke or have quit within the past 15 years.  Fecal occult blood test (FOBT) of the stool. You may have this test every year starting at age 88.  Flexible sigmoidoscopy or colonoscopy. You may have a sigmoidoscopy every 5 years or a colonoscopy every 10 years starting at age 80.  Prostate cancer screening. Recommendations will vary depending on your family history and other risks.  Hepatitis C blood test.  Hepatitis B blood test.  Sexually transmitted disease (STD) testing.  Diabetes screening. This is done by checking your blood sugar (glucose) after you have not eaten for a while (fasting). You may have this done every 1-3 years.  Abdominal aortic aneurysm (AAA) screening. You may need this if you are a current or former smoker.  Osteoporosis. You may be screened starting at age 20 if you are at high risk. Talk with your health care provider about your test results, treatment options, and if necessary, the need for more tests. Vaccines  Your health care provider may recommend  certain vaccines, such as:  Influenza vaccine. This is recommended every year.  Tetanus, diphtheria, and acellular pertussis (Tdap, Td) vaccine. You may need a Td booster every 10 years.  Zoster vaccine. You may  need this after age 71.  Pneumococcal 13-valent conjugate (PCV13) vaccine. One dose is recommended after age 28.  Pneumococcal polysaccharide (PPSV23) vaccine. One dose is recommended after age 52. Talk to your health care provider about which screenings and vaccines you need and how often you need them. This information is not intended to replace advice given to you by your health care provider. Make sure you discuss any questions you have with your health care provider. Document Released: 01/09/2016 Document Revised: 09/01/2016 Document Reviewed: 10/14/2015 Elsevier Interactive Patient Education  2017 [place] Prevention in the Home Falls can cause injuries. They can happen to people of all ages. There are many things you can do to make your home safe and to help prevent falls. What can I do on the outside of my home?  Regularly fix the edges of walkways and driveways and fix any cracks.  Remove anything that might make you trip as you walk through a door, such as a raised step or threshold.  Trim any bushes or trees on the path to your home.  Use bright outdoor lighting.  Clear any walking paths of anything that might make someone trip, such as rocks or tools.  Regularly check to see if handrails are loose or broken. Make sure that both sides of any steps have handrails.  Any raised decks and porches should have guardrails on the edges.  Have any leaves, snow, or ice cleared regularly.  Use sand or salt on walking paths during winter.  Clean up any spills in your garage right away. This includes oil or grease spills. What can I do in the bathroom?  Use night lights.  Install grab bars by the toilet and in the tub and shower. Do not use towel bars as grab bars.  Use non-skid mats or decals in the tub or shower.  If you need to sit down in the shower, use a plastic, non-slip stool.  Keep the floor dry. Clean up any water that spills on the floor as soon as it  happens.  Remove soap buildup in the tub or shower regularly.  Attach bath mats securely with double-sided non-slip rug tape.  Do not have throw rugs and other things on the floor that can make you trip. What can I do in the bedroom?  Use night lights.  Make sure that you have a light by your bed that is easy to reach.  Do not use any sheets or blankets that are too big for your bed. They should not hang down onto the floor.  Have a firm chair that has side arms. You can use this for support while you get dressed.  Do not have throw rugs and other things on the floor that can make you trip. What can I do in the kitchen?  Clean up any spills right away.  Avoid walking on wet floors.  Keep items that you use a lot in easy-to-reach places.  If you need to reach something above you, use a strong step stool that has a grab bar.  Keep electrical cords out of the way.  Do not use floor polish or wax that makes floors slippery. If you must use wax, use non-skid floor wax.  Do not have throw rugs and other  things on the floor that can make you trip. What can I do with my stairs?  Do not leave any items on the stairs.  Make sure that there are handrails on both sides of the stairs and use them. Fix handrails that are broken or loose. Make sure that handrails are as long as the stairways.  Check any carpeting to make sure that it is firmly attached to the stairs. Fix any carpet that is loose or worn.  Avoid having throw rugs at the top or bottom of the stairs. If you do have throw rugs, attach them to the floor with carpet tape.  Make sure that you have a light switch at the top of the stairs and the bottom of the stairs. If you do not have them, ask someone to add them for you. What else can I do to help prevent falls?  Wear shoes that:  Do not have high heels.  Have rubber bottoms.  Are comfortable and fit you well.  Are closed at the toe. Do not wear sandals.  If you  use a stepladder:  Make sure that it is fully opened. Do not climb a closed stepladder.  Make sure that both sides of the stepladder are locked into place.  Ask someone to hold it for you, if possible.  Clearly mark and make sure that you can see:  Any grab bars or handrails.  First and last steps.  Where the edge of each step is.  Use tools that help you move around (mobility aids) if they are needed. These include:  Canes.  Walkers.  Scooters.  Crutches.  Turn on the lights when you go into a dark area. Replace any light bulbs as soon as they burn out.  Set up your furniture so you have a clear path. Avoid moving your furniture around.  If any of your floors are uneven, fix them.  If there are any pets around you, be aware of where they are.  Review your medicines with your doctor. Some medicines can make you feel dizzy. This can increase your chance of falling. Ask your doctor what other things that you can do to help prevent falls. This information is not intended to replace advice given to you by your health care provider. Make sure you discuss any questions you have with your health care provider. Document Released: 10/09/2009 Document Revised: 05/20/2016 Document Reviewed: 01/17/2015 Elsevier Interactive Patient Education  2017 [employer].

## 2020-05-29 ENCOUNTER — Ambulatory Visit: Payer: Medicare Other

## 2020-06-03 ENCOUNTER — Ambulatory Visit (INDEPENDENT_AMBULATORY_CARE_PROVIDER_SITE_OTHER): Payer: Medicare Other | Admitting: Family Medicine

## 2020-06-03 ENCOUNTER — Encounter: Payer: Self-pay | Admitting: Family Medicine

## 2020-06-03 ENCOUNTER — Other Ambulatory Visit: Payer: Self-pay

## 2020-06-03 VITALS — BP 124/70 | HR 64 | Temp 97.0°F | Ht 71.5 in | Wt 206.1 lb

## 2020-06-03 DIAGNOSIS — E291 Testicular hypofunction: Secondary | ICD-10-CM

## 2020-06-03 DIAGNOSIS — Z7189 Other specified counseling: Secondary | ICD-10-CM

## 2020-06-03 DIAGNOSIS — L821 Other seborrheic keratosis: Secondary | ICD-10-CM | POA: Diagnosis not present

## 2020-06-03 DIAGNOSIS — E119 Type 2 diabetes mellitus without complications: Secondary | ICD-10-CM

## 2020-06-03 DIAGNOSIS — E7800 Pure hypercholesterolemia, unspecified: Secondary | ICD-10-CM | POA: Diagnosis not present

## 2020-06-03 DIAGNOSIS — Z0000 Encounter for general adult medical examination without abnormal findings: Secondary | ICD-10-CM

## 2020-06-03 NOTE — Patient Instructions (Addendum)
Please ask the front about signing a record release from Dr. Letta Kocher clinic.   Tetanus shot and shingles shots may be cheaper at the pharmacy.    Benign appearing seborrheic keratosis on the abdomen.   Take care.  Glad to see you.

## 2020-06-03 NOTE — Progress Notes (Signed)
This visit occurred during the SARS-CoV-2 public health emergency.  Safety protocols were in place, including screening questions prior to the visit, additional usage of staff PPE, and extensive cleaning of exam room while observing appropriate contact time as indicated for disinfecting solutions.  T replacement per outside clinic.  Urology follow up d/w pt.  Requesting records.  Last PSA 0.5 per patient report.    "bump on abdomen."  He wanted this evaluated.  Not tender.  He was concerned about potentially having earwax in his ear canals but none was noted on exam.   On suppressive therapy with acyclovir and doing well.  Reasonable to continue as is.  Eye exam scheduled for this fall.   Tetanus d/w pt.  Shingles d/w pt.   Flu 2020 PNA up to date.  covid vaccine 2021.   PSA per urology.   Wife designated if patient were incapacitated. He would have [employer] designated next if his wife were incapacitated.  Colonoscopy 2019.   Intentional weight loss with diet and exercise.    Elevated Cholesterol: Using medications without problems:yes Muscle aches: no Diet compliance:yes Exercise:yes Alternating RYR and crestor.    Hyperglycemia vs DM2 d/w pt.  Labs d/w pt.  Still on metformin.  No ADE on med.   No low sugars.    PMH and SH reviewed  Meds, vitals, and allergies reviewed.   ROS: Per HPI unless specifically indicated in ROS section   GEN: nad, alert and oriented HEENT: ncat, TM wnl B without cerumen impaction bilaterally NECK: supple w/o LA CV: rrr. PULM: ctab, no inc wob ABD: soft, +bs EXT: no edema SKIN: no acute rash Benign appearing SK on abd Likely lipoma on the L thigh.    At least 30 minutes were devoted to patient care in this encounter (this can potentially include time spent reviewing the patient's file/history, interviewing and examining the patient, counseling/reviewing plan with patient, ordering referrals, ordering tests, reviewing relevant  laboratory or x-ray data, and documenting the encounter).

## 2020-06-05 DIAGNOSIS — L821 Other seborrheic keratosis: Secondary | ICD-10-CM | POA: Insufficient documentation

## 2020-06-05 MED ORDER — METFORMIN HCL 500 MG PO TABS: 500.0000 mg | ORAL_TABLET | Freq: Every day | ORAL | 3 refills | Status: DC

## 2020-06-05 MED ORDER — ACYCLOVIR 800 MG PO TABS: 800.0000 mg | ORAL_TABLET | Freq: Every day | ORAL | 3 refills | Status: DC

## 2020-06-05 MED ORDER — ROSUVASTATIN CALCIUM 20 MG PO TABS: ORAL_TABLET | ORAL | 3 refills | Status: DC

## 2020-06-05 NOTE — Assessment & Plan Note (Signed)
Benign appearing, reassured.  Update me as needed.

## 2020-06-05 NOTE — Assessment & Plan Note (Signed)
Wife designated if patient were incapacitated.  He would have Sheryl Eller designated next if his wife were incapacitated.   °

## 2020-06-05 NOTE — Assessment & Plan Note (Signed)
Eye exam scheduled for this fall.   Tetanus d/w pt.  Shingles d/w pt.   Flu 2020 PNA up to date.  covid vaccine 2021.   PSA per urology.   Wife designated if patient were incapacitated. He would have [employer] designated next if his wife were incapacitated.  Colonoscopy 2019.

## 2020-06-05 NOTE — Assessment & Plan Note (Signed)
Alternating RYR and crestor.   Continue as is.  Intentional weight loss with diet and exercise noted.  Discussed.

## 2020-06-05 NOTE — Assessment & Plan Note (Signed)
Hyperglycemia vs DM2 d/w pt. unclear if he is diabetic at this point given his A1c below 6.5 on a low dose of metformin.  Labs d/w pt. he wanted to continue on Metformin and this is reasonable.  No ADE on med.   No low sugars.   We can recheck periodically.  He agrees.  Recheck in 6 months with A1c at the visit.

## 2020-06-05 NOTE — Assessment & Plan Note (Signed)
T replacement per outside clinic.  Urology follow up d/w pt.  Requesting records.  Last PSA 0.5 per patient report.

## 2020-06-28 ENCOUNTER — Other Ambulatory Visit: Payer: Self-pay | Admitting: Family Medicine

## 2020-09-18 ENCOUNTER — Ambulatory Visit (INDEPENDENT_AMBULATORY_CARE_PROVIDER_SITE_OTHER): Payer: Medicare Other

## 2020-09-18 DIAGNOSIS — Z23 Encounter for immunization: Secondary | ICD-10-CM | POA: Diagnosis not present

## 2020-09-19 ENCOUNTER — Telehealth: Payer: Self-pay | Admitting: Family Medicine

## 2020-09-19 NOTE — Telephone Encounter (Signed)
For reference- CDC recommendation:  People 65 years and older should receive a booster shot of Pfizer-BioNTech's COVID-19 vaccine at least 6 months after their Pfizer-BioNTech primary series.   Website pasted below has information to schedule a vaccination appointment.  I expect [facility] to rollout additional scheduling appointments in the near future.  Thanks.  [email or web address]

## 2020-09-19 NOTE — Telephone Encounter (Signed)
Spouse  called she wanted know when pt  can get his  covid booster vaccine  1st  01/19/20 2nd 02/09/20 Flu shot 09/18/20 Please advise

## 2020-09-22 NOTE — Telephone Encounter (Signed)
Patient advised.

## 2020-10-21 ENCOUNTER — Ambulatory Visit: Payer: Medicare Other

## 2020-11-27 ENCOUNTER — Other Ambulatory Visit: Payer: Self-pay

## 2020-11-27 ENCOUNTER — Other Ambulatory Visit (INDEPENDENT_AMBULATORY_CARE_PROVIDER_SITE_OTHER): Payer: Medicare Other

## 2020-11-27 ENCOUNTER — Other Ambulatory Visit: Payer: Self-pay | Admitting: Family Medicine

## 2020-11-27 DIAGNOSIS — E119 Type 2 diabetes mellitus without complications: Secondary | ICD-10-CM

## 2020-11-27 LAB — BASIC METABOLIC PANEL
BUN: 20 mg/dL (ref 6–23)
CO2: 31 mEq/L (ref 19–32)
Calcium: 9.9 mg/dL (ref 8.4–10.5)
Chloride: 101 mEq/L (ref 96–112)
Creatinine, Ser: 1.37 mg/dL (ref 0.40–1.50)
GFR: 50.41 mL/min — ABNORMAL LOW (ref >60.00)
Glucose, Bld: 102 mg/dL — ABNORMAL HIGH (ref 70–99)
Potassium: 4.5 mEq/L (ref 3.5–5.1)
Sodium: 139 mEq/L (ref 135–145)

## 2020-11-27 LAB — HEMOGLOBIN A1C: Hgb A1c MFr Bld: 6.1 % (ref 4.6–6.5)

## 2020-12-04 ENCOUNTER — Ambulatory Visit (INDEPENDENT_AMBULATORY_CARE_PROVIDER_SITE_OTHER): Payer: Medicare Other | Admitting: Family Medicine

## 2020-12-04 ENCOUNTER — Other Ambulatory Visit: Payer: Self-pay

## 2020-12-04 ENCOUNTER — Encounter: Payer: Self-pay | Admitting: Family Medicine

## 2020-12-04 DIAGNOSIS — R739 Hyperglycemia, unspecified: Secondary | ICD-10-CM

## 2020-12-04 NOTE — Patient Instructions (Signed)
Don't change your meds for now.  Recheck in about 6 months at a yearly physical/visit.  Labs ahead of time if possible.   Update me as needed.  Keep working on diet and exercise.   If you have any trouble with low sugar symptoms, then stop the metformin and update me.  Take care.  Glad to see you.

## 2020-12-04 NOTE — Progress Notes (Signed)
This visit occurred during the SARS-CoV-2 public health emergency.  Safety protocols were in place, including screening questions prior to the visit, additional usage of staff PPE, and extensive cleaning of exam room while observing appropriate contact time as indicated for disinfecting solutions.  Hyperglycemia vs DM, he likely doesn't have DM2 at this point.   Using medications without difficulties: still on metformin.  Hypoglycemic episodes: no sx Hyperglycemic episodes: no sx Feet problems: no Blood Sugars averaging: not checked at home.   eye exam within last year: due, d/w pt.   Labs d/w pt.    Meds, vitals, and allergies reviewed.  ROS: Per HPI unless specifically indicated in ROS section   GEN: nad, alert and oriented HEENT: ncat NECK: supple w/o LA CV: rrr. PULM: ctab, no inc wob ABD: soft, +bs EXT: no edema SKIN: well perfused.

## 2020-12-07 DIAGNOSIS — R739 Hyperglycemia, unspecified: Secondary | ICD-10-CM | POA: Insufficient documentation

## 2020-12-07 NOTE — Assessment & Plan Note (Signed)
Diabetes versus hyperglycemia discussed with patient.  Given his A1c, I do not think the stopping Metformin would make his A1c rises above 6.5.  That said, it likely makes sense to continue medication as is since he is tolerating it. Recheck in about 6 months at a yearly physical/visit.  Labs ahead of time if possible.   Update me as needed.  Keep working on diet and exercise.   If any trouble with low sugar symptoms, then stop the metformin and update me.  He agrees with plan.

## 2020-12-23 ENCOUNTER — Other Ambulatory Visit: Payer: Self-pay | Admitting: Family Medicine

## 2021-01-16 ENCOUNTER — Other Ambulatory Visit: Payer: Self-pay

## 2021-01-16 ENCOUNTER — Telehealth (INDEPENDENT_AMBULATORY_CARE_PROVIDER_SITE_OTHER): Payer: Self-pay | Admitting: Gastroenterology

## 2021-01-16 DIAGNOSIS — Z86010 Personal history of colonic polyps: Secondary | ICD-10-CM

## 2021-01-16 DIAGNOSIS — N200 Calculus of kidney: Secondary | ICD-10-CM | POA: Insufficient documentation

## 2021-01-16 MED ORDER — NA SULFATE-K SULFATE-MG SULF 17.5-3.13-1.6 GM/177ML PO SOLN: 1.0000 | Freq: Once | ORAL | 0 refills | Status: AC

## 2021-01-16 NOTE — Progress Notes (Signed)
Gastroenterology Pre-Procedure Review  Request Date: 03/12/21 Requesting Physician: Dr. Marius Ditch  PATIENT REVIEW QUESTIONS: The patient responded to the following health history questions as indicated:    1. Are you having any GI issues? No 2. Do you have a personal history of Polyps? Yes 03/09/18 3. Do you have a family history of Colon Cancer or Polyps? Family history unknown 4. Diabetes Mellitus? Pt states he is not diabetic takes metformin for diabetes prevention 5. Joint replacements in the past 12 months?no 6. Major health problems in the past 3 months?no 7. Any artificial heart valves, MVP, or defibrillator?no    MEDICATIONS & ALLERGIES:    Patient reports the following regarding taking any anticoagulation/antiplatelet therapy:   Plavix, Coumadin, Eliquis, Xarelto, Lovenox, Pradaxa, Brilinta, or Effient? no Aspirin? no  Patient confirms/reports the following medications:  Current Outpatient Medications  Medication Sig Dispense Refill  . acyclovir (ZOVIRAX) 800 MG tablet TAKE 1 TABLET DAILY 90 tablet 1  . Calcium Carbonate (CALCIUM 600 PO) Take 2 tablets by mouth daily.    [name] GLUCOSAMINE HCL PO Take 1 tablet by mouth daily.     . Magnesium 500 MG TABS Take 1 tablet by mouth at bedtime.    . metFORMIN (GLUCOPHAGE) 500 MG tablet TAKE 1 TABLET DAILY 90 tablet 1  . Multiple Vitamin (MULTIVITAMIN) tablet Take 1 tablet by mouth daily. Reported on 01/07/2016    . Omega-3 Fatty Acids ([employer] FISH OIL PO) Take 2 tablets by mouth daily.     . Red Yeast Rice 600 MG TABS Take 2 tablets (1,200 mg total) every other day by mouth. Alternate with crestor    . rosuvastatin (CRESTOR) 20 MG tablet TAKE 1 TABLET BY MOUTH EVERY OTHER DAY IN THE MORNING 90 tablet 3   No current facility-administered medications for this visit.    Patient confirms/reports the following allergies:  No Known Allergies  No orders of the defined types were placed in this encounter.   AUTHORIZATION INFORMATION Primary  Insurance: 1D#: Group #:  Secondary Insurance: 1D#: Group #:  SCHEDULE INFORMATION: Date: Thursday 03/12/21 Time: Location:[facility]

## 2021-01-20 ENCOUNTER — Other Ambulatory Visit: Payer: Self-pay

## 2021-01-20 DIAGNOSIS — Z86010 Personal history of colonic polyps: Secondary | ICD-10-CM

## 2021-01-20 MED ORDER — NA SULFATE-K SULFATE-MG SULF 17.5-3.13-1.6 GM/177ML PO SOLN: 1.0000 | Freq: Once | ORAL | 0 refills | Status: AC

## 2021-02-05 ENCOUNTER — Other Ambulatory Visit: Payer: Self-pay

## 2021-02-05 MED ORDER — GOLYTELY 236 G PO SOLR: 4000.0000 mL | Freq: Once | ORAL | 0 refills | Status: AC

## 2021-03-04 ENCOUNTER — Encounter: Payer: Self-pay | Admitting: Gastroenterology

## 2021-03-10 ENCOUNTER — Other Ambulatory Visit
Admission: RE | Admit: 2021-03-10 | Discharge: 2021-03-10 | Disposition: A | Discharge status: Home / Self Care | Payer: Medicare Other | Source: Ambulatory Visit | Attending: Gastroenterology | Admitting: Gastroenterology

## 2021-03-10 ENCOUNTER — Other Ambulatory Visit: Payer: Self-pay

## 2021-03-10 DIAGNOSIS — Z01812 Encounter for preprocedural laboratory examination: Secondary | ICD-10-CM | POA: Diagnosis present

## 2021-03-10 DIAGNOSIS — Z20822 Contact with and (suspected) exposure to covid-19: Secondary | ICD-10-CM | POA: Insufficient documentation

## 2021-03-10 LAB — SARS CORONAVIRUS 2 (TAT 6-24 HRS): SARS Coronavirus 2: NEGATIVE

## 2021-03-11 ENCOUNTER — Telehealth: Payer: Self-pay | Admitting: Gastroenterology

## 2021-03-11 NOTE — Discharge Instructions (Signed)

## 2021-03-11 NOTE — Telephone Encounter (Signed)
Patient needs clarification on some questions he has about his prep.  Procedure is schedules for tomorrow

## 2021-03-11 NOTE — Telephone Encounter (Signed)
Gave him the instruction for the prep for Golytely.

## 2021-03-12 ENCOUNTER — Ambulatory Visit
Admission: RE | Admit: 2021-03-12 | Discharge: 2021-03-12 | Disposition: A | Discharge status: Home / Self Care | Payer: Medicare Other | Attending: Gastroenterology | Admitting: Gastroenterology

## 2021-03-12 ENCOUNTER — Ambulatory Visit: Payer: Medicare Other | Admitting: Anesthesiology

## 2021-03-12 ENCOUNTER — Encounter
Admission: RE | Disposition: A | Discharge status: Home / Self Care | Payer: Self-pay | Source: Home / Self Care | Attending: Gastroenterology

## 2021-03-12 ENCOUNTER — Encounter: Payer: Self-pay | Admitting: Gastroenterology

## 2021-03-12 ENCOUNTER — Other Ambulatory Visit: Payer: Self-pay

## 2021-03-12 DIAGNOSIS — Z1211 Encounter for screening for malignant neoplasm of colon: Secondary | ICD-10-CM | POA: Insufficient documentation

## 2021-03-12 DIAGNOSIS — Z7984 Long term (current) use of oral hypoglycemic drugs: Secondary | ICD-10-CM | POA: Insufficient documentation

## 2021-03-12 DIAGNOSIS — Z8546 Personal history of malignant neoplasm of prostate: Secondary | ICD-10-CM | POA: Diagnosis not present

## 2021-03-12 DIAGNOSIS — D122 Benign neoplasm of ascending colon: Secondary | ICD-10-CM | POA: Insufficient documentation

## 2021-03-12 DIAGNOSIS — K635 Polyp of colon: Secondary | ICD-10-CM | POA: Insufficient documentation

## 2021-03-12 DIAGNOSIS — Z87891 Personal history of nicotine dependence: Secondary | ICD-10-CM | POA: Diagnosis not present

## 2021-03-12 DIAGNOSIS — Z86010 Personal history of colonic polyps: Secondary | ICD-10-CM

## 2021-03-12 DIAGNOSIS — K5730 Diverticulosis of large intestine without perforation or abscess without bleeding: Secondary | ICD-10-CM | POA: Diagnosis not present

## 2021-03-12 DIAGNOSIS — Z79899 Other long term (current) drug therapy: Secondary | ICD-10-CM | POA: Diagnosis not present

## 2021-03-12 DIAGNOSIS — R739 Hyperglycemia, unspecified: Secondary | ICD-10-CM

## 2021-03-12 DIAGNOSIS — K644 Residual hemorrhoidal skin tags: Secondary | ICD-10-CM | POA: Diagnosis not present

## 2021-03-12 HISTORY — PX: COLONOSCOPY WITH PROPOFOL: SHX5780

## 2021-03-12 HISTORY — PX: POLYPECTOMY: SHX5525

## 2021-03-12 LAB — GLUCOSE, CAPILLARY: Glucose-Capillary: 85 mg/dL (ref 70–99)

## 2021-03-12 SURGERY — COLONOSCOPY WITH PROPOFOL
Anesthesia: General | Site: Rectum

## 2021-03-12 MED ORDER — LACTATED RINGERS IV SOLN: INTRAVENOUS | Status: DC

## 2021-03-12 MED ORDER — LIDOCAINE HCL (CARDIAC) PF 100 MG/5ML IV SOSY
PREFILLED_SYRINGE | INTRAVENOUS | Status: DC | PRN
  Administered 2021-03-12: 50 mg via INTRAVENOUS

## 2021-03-12 MED ORDER — STERILE WATER FOR IRRIGATION IR SOLN
Status: DC | PRN
  Administered 2021-03-12: .05 mL

## 2021-03-12 MED ORDER — PROPOFOL 10 MG/ML IV BOLUS
INTRAVENOUS | Status: DC | PRN
  Administered 2021-03-12: 20 mg via INTRAVENOUS
  Administered 2021-03-12: 120 mg via INTRAVENOUS
  Administered 2021-03-12 (×2): 20 mg via INTRAVENOUS
  Administered 2021-03-12: 40 mg via INTRAVENOUS
  Administered 2021-03-12: 20 mg via INTRAVENOUS
  Administered 2021-03-12: 40 mg via INTRAVENOUS
  Administered 2021-03-12 (×3): 20 mg via INTRAVENOUS

## 2021-03-12 SURGICAL SUPPLY — 7 items
FORCEPS BIOP RAD 4 LRG CAP 4 (CUTTING FORCEPS) ×1 IMPLANT
GOWN CVR UNV OPN BCK APRN NK (MISCELLANEOUS) ×4 IMPLANT
GOWN ISOL THUMB LOOP REG UNIV (MISCELLANEOUS) ×6
KIT PRC NS LF DISP ENDO (KITS) ×2 IMPLANT
KIT PROCEDURE OLYMPUS (KITS) ×3
MANIFOLD NEPTUNE II (INSTRUMENTS) ×3 IMPLANT
WATER STERILE IRR 250ML POUR (IV SOLUTION) ×3 IMPLANT

## 2021-03-12 NOTE — H&P (Signed)
[name], MD [address]  [place]  [place], [place] [address]  Main: [phone]  Fax: [phone] Pager: [phone]  Primary Care Physician:  Tonia Ghent, MD Primary Gastroenterologist:  Dr. [name]  Pre-Procedure History & Physical: HPI:  Xavier Esparza is a 75 y.o. male is here for an colonoscopy.   Past Medical History:  Diagnosis Date   Adopted    Cataract    resolved with surgery   Diabetes mellitus    Dyslipidemia    Erectile dysfunction    Herpes labialis    Upper Lip   Hyperlipidemia    Male hypogonadism    Prostate cancer [facility])    Renal stones    Thyroid nodule    Right    Past Surgical History:  Procedure Laterality Date   CATARACT EXTRACTION, BILATERAL  2017   Dr. Katy Fitch   COLONOSCOPY WITH PROPOFOL N/A 03/09/2018   Procedure: COLONOSCOPY WITH PROPOFOL;  Surgeon: Lin Landsman, MD;  Location: [facility];  Service: Gastroenterology;  Laterality: N/A;   CYSTOSCOPY W/ URETERAL STENT PLACEMENT  08/09/2017   Procedure: CYSTOSCOPY WITH RETROGRADE PYELOGRAM/URETERAL STENT PLACEMENT;  Surgeon: [facility] Cowper, MD;  Location: [facility];  Service: Urology;;   EXTRACORPOREAL SHOCK WAVE LITHOTRIPSY Left 09/15/2017   Procedure: EXTRACORPOREAL SHOCK WAVE LITHOTRIPSY (ESWL);  Surgeon: [facility] Cowper, MD;  Location: [facility];  Service: Urology;  Laterality: Left;   EYE SURGERY Bilateral 2016   cataract extractions   GREEN LIGHT LASER TURP (TRANSURETHRAL RESECTION OF PROSTATE N/A 04/20/2016   Procedure: GREEN LIGHT LASER TURP (TRANSURETHRAL RESECTION OF PROSTATE;  Surgeon: [facility] Cowper, MD;  Location: [facility];  Service: Urology;  Laterality: N/A;   INGUINAL HERNIA REPAIR  01/11/2012   Procedure: HERNIA REPAIR INGUINAL ADULT;  Surgeon: Pedro Earls, MD;  Location: [facility];  Service: General;  Laterality: Right;  Open right inguinal hernia repair with mesh   RADIOACTIVE SEED IMPLANT     PROSTATE    URETEROSCOPY WITH HOLMIUM LASER LITHOTRIPSY Right 08/09/2017   Procedure: URETEROSCOPY WITH HOLMIUM LASER LITHOTRIPSY;  Surgeon: [facility] Cowper, MD;  Location: [facility];  Service: Urology;  Laterality: Right;    Prior to Admission medications   Medication Sig Start Date End Date Taking? Authorizing Provider  acyclovir (ZOVIRAX) 800 MG tablet TAKE 1 TABLET DAILY 12/23/20  Yes Tonia Ghent, MD  Calcium Carbonate (CALCIUM 600 PO) Take 2 tablets by mouth daily.   Yes [provider]  GLUCOSAMINE HCL PO Take 1 tablet by mouth daily.    Yes [provider]  Magnesium 500 MG TABS Take 1 tablet by mouth at bedtime.   Yes [provider]  metFORMIN (GLUCOPHAGE) 500 MG tablet TAKE 1 TABLET DAILY 07/01/20  Yes Tonia Ghent, MD  Multiple Vitamin (MULTIVITAMIN) tablet Take 1 tablet by mouth daily. Reported on 01/07/2016   Yes [provider]  Omega-3 Fatty Acids ([employer] FISH OIL PO) Take 2 tablets by mouth daily.    Yes [provider]  Red Yeast Rice 600 MG TABS Take 2 tablets (1,200 mg total) every other day by mouth. Alternate with crestor 11/11/17  Yes Tonia Ghent, MD  rosuvastatin (CRESTOR) 20 MG tablet TAKE 1 TABLET BY MOUTH EVERY OTHER DAY IN THE MORNING 06/05/20  Yes Tonia Ghent, MD    Allergies as of 01/16/2021   (No Known Allergies)    Family History  Adopted: Yes    Social History  Socioeconomic History   Marital status: Married    Spouse name: Not on file   Number of children: Not on file   Years of education: Not on file   Highest education level: Not on file  Occupational History   Not on file  Tobacco Use   Smoking status: Former Smoker    Years: 30.00    Types: Cigarettes    Quit date: 12/27/1990    Years since quitting: 30.2   Smokeless tobacco: Never Used  Vaping Use   Vaping Use: Never used  Substance and Sexual Activity   Alcohol use: Yes    Comment: Rare Socially   Drug use: No   Sexual  activity: Yes  Other Topics Concern   Not on file  Social History Narrative   From [place] then [employer]   Married 1971   2 children alive as of 03/20/17, 1 biological child and one stepchild   One child died Mar 20, 2017   Social Determinants of Health   Financial Resource Strain: Not on file  Food Insecurity: Not on file  Transportation Needs: Not on file  Physical Activity: Not on file  Stress: Not on file  Social Connections: Not on file  Intimate Partner Violence: Not on file    Review of Systems: See HPI, otherwise negative ROS  Physical Exam: BP 131/72    Pulse 61    Temp 97.8 F (36.6 C) (Temporal)    Resp 18    Ht 6' (1.829 m)    Wt 93 kg    SpO2 99%    BMI 27.80 kg/m  General:   Alert,  pleasant and cooperative in NAD Head:  Normocephalic and atraumatic. Neck:  Supple; no masses or thyromegaly. Lungs:  Clear throughout to auscultation.    Heart:  Regular rate and rhythm. Abdomen:  Soft, nontender and nondistended. Normal bowel sounds, without guarding, and without rebound.   Neurologic:  Alert and  oriented x4;  grossly normal neurologically.  Impression/Plan: Xavier Esparza is here for an colonoscopy to be performed for h/o colon polyps  Risks, benefits, limitations, and alternatives regarding  colonoscopy have been reviewed with the patient.  Questions have been answered.  All parties agreeable.   Sherri Sear, MD  03/12/2021, 8:14 AM

## 2021-03-12 NOTE — Anesthesia Procedure Notes (Signed)
Date/Time: 03/12/2021 8:22 AM Performed by: Cameron Ali, CRNA Pre-anesthesia Checklist: Patient identified, Emergency Drugs available, Suction available, Timeout performed and Patient being monitored Patient Re-evaluated:Patient Re-evaluated prior to induction Oxygen Delivery Method: Nasal cannula Placement Confirmation: positive ETCO2

## 2021-03-12 NOTE — Transfer of Care (Signed)
Immediate Anesthesia Transfer of Care Note  Patient: Xavier Esparza  Procedure(s) Performed: COLONOSCOPY WITH PROPOFOL (N/A Rectum) POLYPECTOMY (Rectum)  Patient Location: PACU  Anesthesia Type: General  Level of Consciousness: awake, alert  and patient cooperative  Airway and Oxygen Therapy: Patient Spontanous Breathing and Patient connected to supplemental oxygen  Post-op Assessment: Post-op Vital signs reviewed, Patient's Cardiovascular Status Stable, Respiratory Function Stable, Patent Airway and No signs of Nausea or vomiting  Post-op Vital Signs: Reviewed and stable  Complications: No complications documented.

## 2021-03-12 NOTE — Op Note (Signed)
[facility] Gastroenterology Patient Name: Xavier Esparza Procedure Date: 03/12/2021 7:51 AM MRN: [id number] Account #: [id number] Date of Birth: May 25, 1946 Admit Type: Outpatient Age: 75 Room: [facility] OR ROOM 01 Gender: Male Note Status: Finalized Procedure:             Colonoscopy Indications:           Surveillance: Personal history of adenomatous polyps                         on last colonoscopy 3 years ago, Last colonoscopy:                         March 2019 Providers:             Lin Landsman MD, MD Referring MD:          Elveria Rising. Damita Dunnings, MD (Referring MD) Medicines:             General Anesthesia Complications:         No immediate complications. Estimated blood loss: None. Procedure:             Pre-Anesthesia Assessment:                        - Prior to the procedure, a History and Physical was                         performed, and patient medications and allergies were                         reviewed. The patient is competent. The risks and                         benefits of the procedure and the sedation options and                         risks were discussed with the patient. All questions                         were answered and informed consent was obtained.                         Patient identification and proposed procedure were                         verified by the physician, the nurse, the                         anesthesiologist, the anesthetist and the technician                         in the pre-procedure area in the procedure room in the                         endoscopy suite. Mental Status Examination: alert and                         oriented. Airway Examination: normal oropharyngeal  airway and neck mobility. Respiratory Examination:                         clear to auscultation. CV Examination: normal.                         Prophylactic Antibiotics: The patient does not require                          prophylactic antibiotics. Prior Anticoagulants: The                         patient has taken no previous anticoagulant or                         antiplatelet agents. ASA Grade Assessment: II - A                         patient with mild systemic disease. After reviewing                         the risks and benefits, the patient was deemed in                         satisfactory condition to undergo the procedure. The                         anesthesia plan was to use general anesthesia.                         Immediately prior to administration of medications,                         the patient was re-assessed for adequacy to receive                         sedatives. The heart rate, respiratory rate, oxygen                         saturations, blood pressure, adequacy of pulmonary                         ventilation, and response to care were monitored                         throughout the procedure. The physical status of the                         patient was re-assessed after the procedure.                        After obtaining informed consent, the colonoscope was                         passed under direct vision. Throughout the procedure,                         the patient's blood pressure, pulse, and oxygen  saturations were monitored continuously. The was                         introduced through the anus and advanced to the the                         cecum, identified by appendiceal orifice and ileocecal                         valve. The colonoscopy was performed with moderate                         difficulty due to multiple diverticula in the colon                         and significant looping. Successful completion of the                         procedure was aided by applying abdominal pressure.                         The patient tolerated the procedure well. The quality                         of the bowel preparation was evaluated using  the BBPS                         [facility] Bowel Preparation Scale) with scores of: Right                         Colon = 3, Transverse Colon = 3 and Left Colon = 3                         (entire mucosa seen well with no residual staining,                         small fragments of stool or opaque liquid). The total                         BBPS score equals 9. Findings:      The perianal and digital rectal examinations were normal. Pertinent       negatives include normal sphincter tone and no palpable rectal lesions.      Three sessile polyps were found in the ascending colon. The polyps were       diminutive in size. These polyps were removed with a cold biopsy       forceps. Resection and retrieval were complete.      Multiple diverticula were found in the left colon.      Non-bleeding external hemorrhoids were found during retroflexion. The       hemorrhoids were medium-sized. Impression:            - Three diminutive polyps in the ascending colon,                         removed with a cold biopsy forceps. Resected and  retrieved.                        - Diverticulosis in the left colon.                        - Non-bleeding external hemorrhoids. Recommendation:        - Discharge patient to home (with escort).                        - Resume previous diet today.                        - Continue present medications.                        - Await pathology results.                        - Repeat colonoscopy in 5 years for surveillance of                         multiple polyps. Procedure Code(s):     --- Professional ---                        [id number], Colonoscopy, flexible; with biopsy, single or                         multiple Diagnosis Code(s):     --- Professional ---                        Z86.010, Personal history of colonic polyps                        K63.5, Polyp of colon                        K64.4, Residual hemorrhoidal skin tags                         K57.30, Diverticulosis of large intestine without                         perforation or abscess without bleeding CPT copyright 2019 American Medical Association. All rights reserved. The codes documented in this report are preliminary and upon coder review may  be revised to meet current compliance requirements. Dr. Ulyess Mort Lin Landsman MD, MD 03/12/2021 8:57:12 AM This report has been signed electronically. Number of Addenda: 0 Note Initiated On: 03/12/2021 7:51 AM Scope Withdrawal Time: 0 hours 14 minutes 33 seconds  Total Procedure Duration: 0 hours 21 minutes 53 seconds  Estimated Blood Loss:  Estimated blood loss: none.      [facility]

## 2021-03-12 NOTE — Anesthesia Preprocedure Evaluation (Signed)
Anesthesia Evaluation  Patient identified by MRN, date of birth, ID band Patient awake    Reviewed: Allergy & Precautions, H&P , NPO status , Patient's Chart, lab work & pertinent test results, reviewed documented beta blocker date and time   History of Anesthesia Complications Negative for: history of anesthetic complications  Airway Mallampati: II  TM Distance: >3 FB Neck ROM: full    Dental  (+) Dental Advidsory Given, Teeth Intact   Pulmonary neg pulmonary ROS, former smoker,    Pulmonary exam normal        Cardiovascular negative cardio ROS Normal cardiovascular exam     Neuro/Psych negative neurological ROS  negative psych ROS   GI/Hepatic Neg liver ROS, colon polyps   Endo/Other  diabetes, Type 2  Renal/GU Renal disease (kidney stones)  negative genitourinary   Musculoskeletal   Abdominal Normal abdominal exam  (+)   Peds  Hematology negative hematology ROS (+)   Anesthesia Other Findings   Reproductive/Obstetrics negative OB ROS                             Anesthesia Physical  Anesthesia Plan  ASA: II  Anesthesia Plan: General   Post-op Pain Management:    Induction: Intravenous  PONV Risk Score and Plan: 2 and Propofol infusion, TIVA and Treatment may vary due to age or medical condition  Airway Management Planned: Natural Airway and Nasal Cannula  Additional Equipment:   Intra-op Plan:   Post-operative Plan:   Informed Consent: I have reviewed the patients History and Physical, chart, labs and discussed the procedure including the risks, benefits and alternatives for the proposed anesthesia with the patient or authorized representative who has indicated his/her understanding and acceptance.     Dental Advisory Given  Plan Discussed with: CRNA  Anesthesia Plan Comments:         Anesthesia Quick Evaluation

## 2021-03-12 NOTE — Anesthesia Postprocedure Evaluation (Signed)
Anesthesia Post Note  Patient: Xavier Esparza  Procedure(s) Performed: COLONOSCOPY WITH PROPOFOL (N/A Rectum) POLYPECTOMY (Rectum)     Patient location during evaluation: PACU Anesthesia Type: General Level of consciousness: awake and alert Pain management: pain level controlled Vital Signs Assessment: post-procedure vital signs reviewed and stable Respiratory status: nonlabored ventilation and spontaneous breathing Cardiovascular status: blood pressure returned to baseline Postop Assessment: no apparent nausea or vomiting Anesthetic complications: no   No complications documented.  Zienna Ahlin [employer]

## 2021-03-13 ENCOUNTER — Encounter: Payer: Self-pay | Admitting: Gastroenterology

## 2021-03-13 LAB — SURGICAL PATHOLOGY

## 2021-05-11 ENCOUNTER — Other Ambulatory Visit: Payer: Self-pay

## 2021-05-11 MED ORDER — ROSUVASTATIN CALCIUM 20 MG PO TABS: ORAL_TABLET | ORAL | 3 refills | Status: DC

## 2021-06-04 ENCOUNTER — Ambulatory Visit: Payer: Medicare Other | Admitting: Family Medicine

## 2021-06-23 LAB — PSA: PSA: 0.2

## 2021-07-01 ENCOUNTER — Other Ambulatory Visit: Payer: Self-pay | Admitting: Family Medicine

## 2021-07-01 DIAGNOSIS — E119 Type 2 diabetes mellitus without complications: Secondary | ICD-10-CM

## 2021-07-02 ENCOUNTER — Other Ambulatory Visit: Payer: Self-pay

## 2021-07-02 ENCOUNTER — Other Ambulatory Visit (INDEPENDENT_AMBULATORY_CARE_PROVIDER_SITE_OTHER): Payer: Medicare Other

## 2021-07-02 DIAGNOSIS — E119 Type 2 diabetes mellitus without complications: Secondary | ICD-10-CM

## 2021-07-02 LAB — COMPREHENSIVE METABOLIC PANEL
ALT: 16 U/L (ref 0–53)
AST: 26 U/L (ref 0–37)
Albumin: 4 g/dL (ref 3.5–5.2)
Alkaline Phosphatase: 36 U/L — ABNORMAL LOW (ref 39–117)
BUN: 18 mg/dL (ref 6–23)
CO2: 28 mEq/L (ref 19–32)
Calcium: 9.5 mg/dL (ref 8.4–10.5)
Chloride: 102 mEq/L (ref 96–112)
Creatinine, Ser: 1.28 mg/dL (ref 0.40–1.50)
GFR: 54.47 mL/min — ABNORMAL LOW (ref >60.00)
Glucose, Bld: 93 mg/dL (ref 70–99)
Potassium: 3.9 mEq/L (ref 3.5–5.1)
Sodium: 138 mEq/L (ref 135–145)
Total Bilirubin: 1 mg/dL (ref 0.2–1.2)
Total Protein: 7.1 g/dL (ref 6.0–8.3)

## 2021-07-02 LAB — LIPID PANEL
Cholesterol: 177 mg/dL (ref 0–200)
HDL: 49.9 mg/dL (ref >39.00)
NonHDL: 127.5
Total CHOL/HDL Ratio: 4
Triglycerides: 210 mg/dL — ABNORMAL HIGH (ref 0.0–149.0)
VLDL: 42 mg/dL — ABNORMAL HIGH (ref 0.0–40.0)

## 2021-07-02 LAB — LDL CHOLESTEROL, DIRECT: Direct LDL: 90 mg/dL

## 2021-07-02 LAB — MICROALBUMIN / CREATININE URINE RATIO
Creatinine,U: 191.2 mg/dL
Microalb Creat Ratio: 3.7 mg/g (ref 0.0–30.0)
Microalb, Ur: 7 mg/dL — ABNORMAL HIGH (ref 0.0–1.9)

## 2021-07-02 LAB — HEMOGLOBIN A1C: Hgb A1c MFr Bld: 6 % (ref 4.6–6.5)

## 2021-07-07 ENCOUNTER — Other Ambulatory Visit: Payer: Self-pay

## 2021-07-07 ENCOUNTER — Ambulatory Visit (INDEPENDENT_AMBULATORY_CARE_PROVIDER_SITE_OTHER): Payer: Medicare Other | Admitting: Family Medicine

## 2021-07-07 ENCOUNTER — Encounter: Payer: Self-pay | Admitting: Family Medicine

## 2021-07-07 VITALS — BP 118/76 | HR 52 | Temp 96.9°F | Wt 211.4 lb

## 2021-07-07 DIAGNOSIS — R001 Bradycardia, unspecified: Secondary | ICD-10-CM

## 2021-07-07 DIAGNOSIS — R739 Hyperglycemia, unspecified: Secondary | ICD-10-CM

## 2021-07-07 DIAGNOSIS — E119 Type 2 diabetes mellitus without complications: Secondary | ICD-10-CM | POA: Diagnosis not present

## 2021-07-07 DIAGNOSIS — E7800 Pure hypercholesterolemia, unspecified: Secondary | ICD-10-CM

## 2021-07-07 MED ORDER — LISINOPRIL 2.5 MG PO TABS: 2.5000 mg | ORAL_TABLET | Freq: Every day | ORAL | 3 refills | Status: DC

## 2021-07-07 NOTE — Progress Notes (Signed)
This visit occurred during the SARS-CoV-2 public health emergency.  Safety protocols were in place, including screening questions prior to the visit, additional usage of staff PPE, and extensive cleaning of exam room while observing appropriate contact time as indicated for disinfecting solutions.  Diabetes vs hyperglycemia (possible dx given ongoing metformin use, d/w pt) Using medications without difficulties: yes Hypoglycemic episodes: no sx Hyperglycemic episodes: no sx Feet problems: no Blood Sugars averaging:  no sx eye exam within last year: yes, this [facility] [address]) MALB pos, d/w pt. discussed with patient about ACE inhibitor start with ACE specific cautions considered.  No contraindication.  Discussed potential for cough and angioedema.  Elevated Cholesterol: Using medications without problems: yes Muscle aches: no Diet compliance: yes Exercise: yes  He is still seeing urology.    Relative bradycardia noted, recheck pulse 52 manually.  Not lightheaded.  He has high fitness level given his age.  He is doing resistance walking/jogging 3 miles.  He has about a 15 minute pace.    Meds, vitals, and allergies reviewed.  ROS: Per HPI unless specifically indicated in ROS section   GEN: nad, alert and oriented HEENT: ncat NECK: supple w/o LA CV: rrr. PULM: ctab, no inc wob ABD: soft, +bs EXT: no edema SKIN: Well-perfused.  Diabetic foot exam: Normal inspection No skin breakdown No calluses  Normal DP pulses Normal sensation to light touch and monofilament Nails normal

## 2021-07-07 NOTE — Patient Instructions (Addendum)
Ask the front to send a record request to [facility] on [address] and Dr. Yves Dill with urology.   Take care.  Glad to see you. Plan on recheck in about 6 months.  Labs ahead of time if possible.

## 2021-07-08 DIAGNOSIS — R001 Bradycardia, unspecified: Secondary | ICD-10-CM | POA: Insufficient documentation

## 2021-07-08 NOTE — Assessment & Plan Note (Signed)
Continue alternating red yeast rice and Crestor.  Continue work on diet and exercise.

## 2021-07-08 NOTE — Assessment & Plan Note (Addendum)
Hyperglycemia versus diabetes discussed with patient.  See above. MALB pos, d/w pt. discussed with patient about ACE inhibitor start with ACE specific cautions considered.  No contraindication.  Discussed potential for cough and angioedema.  Prescription for lisinopril sent.  Recheck periodically.  See after visit summary.

## 2021-07-08 NOTE — Assessment & Plan Note (Signed)
Not on any rate limiting medications and I suspect this is related to fitness level.  He is not lightheaded and he is still okay for outpatient follow-up.

## 2021-07-15 ENCOUNTER — Telehealth: Payer: Self-pay | Admitting: Family Medicine

## 2021-07-15 NOTE — Telephone Encounter (Signed)
Called patient reviewed information in note. Informed that even if he is considered "pre diabetic" he sill has elevated blood sugars and is given to help protect the kidneys. Let him know that I would let you know and see if you had any further instructions we would call. He did not have any further questions and understood after our conversation.

## 2021-07-15 NOTE — Telephone Encounter (Signed)
Pt called he would like a call back to discuss why he was put on lisinopril (ZESTRIL) 2.5 MG tablet

## 2021-07-15 NOTE — Telephone Encounter (Signed)
Completely agree, let me know if he has other questions.  Thanks for addressing this.

## 2021-07-27 ENCOUNTER — Other Ambulatory Visit: Payer: Self-pay | Admitting: Family Medicine

## 2021-09-01 ENCOUNTER — Encounter: Payer: Self-pay | Admitting: Family Medicine

## 2021-09-01 ENCOUNTER — Telehealth (INDEPENDENT_AMBULATORY_CARE_PROVIDER_SITE_OTHER): Payer: Medicare Other | Admitting: Family Medicine

## 2021-09-01 VITALS — Temp 97.4°F | Ht 72.0 in | Wt 211.0 lb

## 2021-09-01 DIAGNOSIS — U071 COVID-19: Secondary | ICD-10-CM | POA: Insufficient documentation

## 2021-09-01 MED ORDER — PROMETHAZINE-DM 6.25-15 MG/5ML PO SYRP: 5.0000 mL | ORAL_SOLUTION | Freq: Every evening | ORAL | 0 refills | Status: DC | PRN

## 2021-09-01 MED ORDER — NIRMATRELVIR/RITONAVIR (PAXLOVID)TABLET: 2.0000 | ORAL_TABLET | Freq: Two times a day (BID) | ORAL | 0 refills | Status: AC

## 2021-09-01 NOTE — Assessment & Plan Note (Signed)
COVID19  Infection < 5 days from onset of symptoms in quadruple vaccinated overweight individual with history of  Prediabetes/diabetes. No clear sign of bacterial infection at this time.   No SOB.  No red flags/need for ER visit or in-person exam at respiratory clinic at this time..    Pt higher risk for COVID complications given  age, weight. GFR  54 and no medication contraindications except will need to hold statin  Start paxlovid 5 day course. Reviewed course of medication and side effect profile with patient in detail.   Symptomatic care with mucinex and cough suppressant at night. If SOB begins symptoms worsening.. have low threshold for in-person exam, if severe shortness of breath ER visit recommended.  Can monitor Oxygen saturation at home with home monitor if able to obtain.  Go to ER if O2 sat < 90% on room air.   Reviewed home care and provided information through [facility].  Recommended quarantine 5 days isolation recommended. Return to work day 6 and wear mask for 4 more days to complete 10 days. Provided info about prevention of spread of COVID 19.

## 2021-09-01 NOTE — Progress Notes (Signed)
TELEPHONE VISIT  Due to national recommendations of social distancing due to [place] 19, Audio telehealth visit is felt to be most appropriate for this patient at this time.   I connected with Xavier Esparza on 09/01/21 at 12:00 PM EDT by telephone and verified that I am speaking with the correct person using two identifiers.   I discussed the limitations, risks, security and privacy concerns of performing an evaluation and management service by telephone and the availability of in person appointments. I also discussed with the patient that there may be a patient responsible charge related to this service. The patient expressed understanding and agreed to proceed.  Patient location: Home Provider Location: [place] Participants: Eliezer Lofts and Xavier Esparza  Chief Complaint  Patient presents with   Covid Positive   Cough   Nasal Congestion     History of Present Illness: 75 year old male patient of Dr. Josefine Class with history of smoking, high cholesterol, prediabetes/diabetes and BMI 28 presents with COVID infection.  No chronic lung disease. No immunocompromise  He started having symptoms, cough on 9/4. Cough , dry, nasal congestion, no ear pain, no ST, no face pain  Scratchy throat with cough.  No body ache, some stiffness.  Increase in fatigue. No fever, no chills.  Waking up coughing some.  No SOB, no Wheeze.   Using cough suppressant.   GFR = 54   COVID 19 screen COVID testing: home test x 2 COVID vaccine:  Covid-19 Vaccine 06/15/2021  Pfizer COVID-19 Vaccine 09/26/2020 , 03/02/2020 , 02/10/2020   COVID exposure: No recent travel or known exposure to [place]  The importance of social distancing was discussed today.    ROS  ROS: Per HPI unless specifically indicated in ROS section     Past Medical History:  Diagnosis Date   Adopted    Cataract    resolved with surgery   Diabetes mellitus    Dyslipidemia    Erectile dysfunction    Herpes labialis     Upper Lip   Hyperlipidemia    Male hypogonadism    Prostate cancer ([place])    Renal stones    Thyroid nodule    Right    reports that he quit smoking about 30 years ago. His smoking use included cigarettes. He has never used smokeless tobacco. He reports current alcohol use. He reports that he does not use drugs.   Current Outpatient Medications:    acyclovir (ZOVIRAX) 800 MG tablet, TAKE 1 TABLET DAILY, Disp: 90 tablet, Rfl: 1   Calcium Carbonate (CALCIUM 600 PO), Take 2 tablets by mouth daily., Disp: , Rfl:    GLUCOSAMINE HCL PO, Take 1 tablet by mouth daily. , Disp: , Rfl:    lisinopril (ZESTRIL) 2.5 MG tablet, Take 1 tablet (2.5 mg total) by mouth daily., Disp: 90 tablet, Rfl: 3   Magnesium 500 MG TABS, Take 1 tablet by mouth at bedtime., Disp: , Rfl:    metFORMIN (GLUCOPHAGE) 500 MG tablet, TAKE 1 TABLET DAILY, Disp: 90 tablet, Rfl: 1   Multiple Vitamin (MULTIVITAMIN) tablet, Take 1 tablet by mouth daily. Reported on 01/07/2016, Disp: , Rfl:    Omega-3 Fatty Acids ([employer] FISH OIL PO), Take 2 tablets by mouth daily. , Disp: , Rfl:    Red Yeast Rice 600 MG TABS, Take 2 tablets (1,200 mg total) every other day by mouth. Alternate with crestor, Disp: , Rfl:    rosuvastatin (CRESTOR) 20 MG tablet, TAKE 1 TABLET BY MOUTH  EVERY OTHER DAY IN THE MORNING, Disp: 90 tablet, Rfl: 3   Observations/Objective: Temperature (!) 97.4 F (36.3 C), temperature source Oral, height 6' (1.829 m), weight 211 lb (95.7 kg).  Physical Exam  Physical Exam Constitutional:      General: The patient is not in acute distress. Pulmonary:     Effort: Pulmonary effort is normal. No respiratory distress.  Neurological:     Mental Status: The patient is alert and oriented to person, place, and time.  Psychiatric:        Mood and Affect: Mood normal.        Behavior: Behavior normal.   Assessment and Plan No problem-specific Assessment & Plan notes found for this encounter.     Problem List Items Addressed This  Visit     COVID-19 virus infection - Primary    COVID19  Infection < 5 days from onset of symptoms in quadruple vaccinated overweight individual with history of  Prediabetes/diabetes. No clear sign of bacterial infection at this time.   No SOB.  No red flags/need for ER visit or in-person exam at respiratory clinic at this time..    Pt higher risk for COVID complications given  age, weight. GFR  54 and no medication contraindications except will need to hold statin  Start paxlovid 5 day course. Reviewed course of medication and side effect profile with patient in detail.   Symptomatic care with mucinex and cough suppressant at night. If SOB begins symptoms worsening.. have low threshold for in-person exam, if severe shortness of breath ER visit recommended.  Can monitor Oxygen saturation at home with home monitor if able to obtain.  Go to ER if O2 sat < 90% on room air.   Reviewed home care and provided information through [facility].  Recommended quarantine 5 days isolation recommended. Return to work day 6 and wear mask for 4 more days to complete 10 days. Provided info about prevention of spread of COVID 19.       Relevant Medications   nirmatrelvir/ritonavir EUA (PAXLOVID) 20 x 150 MG & 10 x 100MG  TABS    I discussed the assessment and treatment plan with the patient. The patient was provided an opportunity to ask questions and all were answered. The patient agreed with the plan and demonstrated an understanding of the instructions.   The patient was advised to call back or seek an in-person evaluation if the symptoms worsen or if the condition fails to improve as anticipated.  I provided 15 minutes of non-face-to-face time during this encounter.   Eliezer Lofts, MD

## 2021-09-01 NOTE — Patient Instructions (Addendum)
Hold crestor and red yeast rice. Start Paxlovid.

## 2021-11-26 ENCOUNTER — Other Ambulatory Visit: Payer: Medicare Other

## 2021-12-08 ENCOUNTER — Encounter: Payer: Medicare Other | Admitting: Family Medicine

## 2021-12-29 ENCOUNTER — Other Ambulatory Visit: Payer: Self-pay

## 2021-12-29 ENCOUNTER — Ambulatory Visit (INDEPENDENT_AMBULATORY_CARE_PROVIDER_SITE_OTHER): Payer: Medicare Other | Admitting: Family Medicine

## 2021-12-29 ENCOUNTER — Encounter: Payer: Self-pay | Admitting: Family Medicine

## 2021-12-29 VITALS — BP 148/78 | HR 72 | Temp 97.5°F | Ht 72.0 in | Wt 213.1 lb

## 2021-12-29 DIAGNOSIS — R053 Chronic cough: Secondary | ICD-10-CM

## 2021-12-29 DIAGNOSIS — R030 Elevated blood-pressure reading, without diagnosis of hypertension: Secondary | ICD-10-CM | POA: Diagnosis not present

## 2021-12-29 MED ORDER — LOSARTAN POTASSIUM 25 MG PO TABS: 25.0000 mg | ORAL_TABLET | Freq: Every day | ORAL | 0 refills | Status: DC

## 2021-12-29 NOTE — Progress Notes (Signed)
Subjective:    Patient ID: Xavier Esparza, male    DOB: 09/26/1946, 76 y.o.   MRN: [id number]  This visit occurred during the SARS-CoV-2 public health emergency.  Safety protocols were in place, including screening questions prior to the visit, additional usage of staff PPE, and extensive cleaning of exam room while observing appropriate contact time as indicated for disinfecting solutions.   HPI 76 yo pt of Dr Damita Dunnings presents with c/o cough   Wt Readings from Last 3 Encounters:  12/29/21 213 lb 2 oz (96.7 kg)  09/01/21 211 lb (95.7 kg)  07/07/21 211 lb 7 oz (95.9 kg)   28.90 kg/m  Cough for about a month  Not as bad as it was to start  A little hoarse also  Initially a dry cough  Last few days a little phlegm - clear /no color  No wheezing  No chest tightness   No heartburn  No post nasal drip  Has not had a live tree in his house   From time to time L ear feels stopped up  Lets warm water run in it  No ST at all   Used lozenges  [facility] brand cough med    Did not start with a cold  He did a covid test-it was negative    He had covid in sept and took Paxlovid  Had cough in 2019- ? Ace caused  Cxr at that time was normal  It got better   Now on lisinopril since July  BP Readings from Last 3 Encounters:  12/29/21 (!) 148/78  07/07/21 118/76  03/12/21 120/80   Patient Active Problem List   Diagnosis Date Noted   COVID-19 virus infection 09/01/2021   Bradycardia 07/08/2021   Kidney stone 01/16/2021   Hyperglycemia 12/07/2020   Seborrheic keratosis 06/05/2020   Cough 08/28/2018   Health care maintenance 05/17/2018   Advance care planning 05/17/2018   Medicare annual wellness visit, initial 05/11/2018   Special screening for malignant neoplasms, colon 02/14/2018   Microalbuminuria 04/26/2017   Prostate cancer ([place]) 04/26/2017   Vegetarian diet 04/26/2017   Benign prostatic hypertrophy 01/07/2016   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   Multinodular goiter  11/30/2011   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 Katy Fitch   COLONOSCOPY WITH PROPOFOL N/A 03/09/2018   Procedure: COLONOSCOPY WITH PROPOFOL;  Surgeon: Lin Landsman, MD;  Location: [place];  Service: Gastroenterology;  Laterality: N/A;   COLONOSCOPY WITH PROPOFOL N/A 03/12/2021   Procedure: COLONOSCOPY WITH PROPOFOL;  Surgeon: Lin Landsman, MD;  Location: [place];  Service: Endoscopy;  Laterality: N/A;  priority 4   CYSTOSCOPY W/ URETERAL STENT PLACEMENT  08/09/2017   Procedure: CYSTOSCOPY WITH RETROGRADE PYELOGRAM/URETERAL STENT PLACEMENT;  Surgeon: Royston Cowper, MD;  Location: [facility];  Service: Urology;;   EXTRACORPOREAL SHOCK WAVE LITHOTRIPSY Left 09/15/2017   Procedure: EXTRACORPOREAL SHOCK WAVE  LITHOTRIPSY (ESWL);  Surgeon: Royston Cowper, MD;  Location: [facility];  Service: Urology;  Laterality: Left;   EYE SURGERY Bilateral 2016   cataract extractions   GREEN LIGHT LASER TURP (TRANSURETHRAL RESECTION OF PROSTATE N/A 04/20/2016   Procedure: GREEN LIGHT LASER TURP (TRANSURETHRAL RESECTION OF PROSTATE;  Surgeon: Royston Cowper, MD;  Location: [facility];  Service: Urology;  Laterality: N/A;   INGUINAL HERNIA REPAIR  01/11/2012   Procedure: HERNIA REPAIR INGUINAL ADULT;  Surgeon: Pedro Earls, MD;  Location: [place];  Service: General;  Laterality: Right;  Open right inguinal hernia repair with mesh   POLYPECTOMY  03/12/2021    Procedure: POLYPECTOMY;  Surgeon: Lin Landsman, MD;  Location: [place];  Service: Endoscopy;;   RADIOACTIVE SEED IMPLANT     PROSTATE   URETEROSCOPY WITH HOLMIUM LASER LITHOTRIPSY Right 08/09/2017   Procedure: URETEROSCOPY WITH HOLMIUM LASER LITHOTRIPSY;  Surgeon: Royston Cowper, MD;  Location: [facility];  Service: Urology;  Laterality: Right;   Social History   Tobacco Use   Smoking status: Former    Years: 30.00    Types: Cigarettes    Quit date: 12/27/1990    Years since quitting: 31.0   Smokeless tobacco: Never  Vaping Use   Vaping Use: Never used  Substance Use Topics   Alcohol use: Yes    Comment: Rare Socially   Drug use: No   Family History  Adopted: Yes   No Known Allergies Current Outpatient Medications on File Prior to Visit  Medication Sig Dispense Refill   acyclovir (ZOVIRAX) 800 MG tablet TAKE 1 TABLET DAILY 90 tablet 1   Calcium Carbonate (CALCIUM 600 PO) Take 2 tablets by mouth daily.     GLUCOSAMINE HCL PO Take 1 tablet by mouth daily.      Magnesium 500 MG TABS Take 1 tablet by mouth at bedtime.     metFORMIN (GLUCOPHAGE) 500 MG tablet TAKE 1 TABLET DAILY 90 tablet 1   Multiple Vitamin (MULTIVITAMIN) tablet Take 1 tablet by mouth daily. Reported on 01/07/2016     Omega-3 Fatty Acids ([employer] FISH OIL PO) Take 2 tablets by mouth daily.      Red Yeast Rice 600 MG TABS Take 2 tablets (1,200 mg total) every other day by mouth. Alternate with crestor     rosuvastatin (CRESTOR) 20 MG tablet TAKE 1 TABLET BY MOUTH EVERY OTHER DAY IN THE MORNING 90 tablet 3   No current facility-administered medications on file prior to visit.     Review of Systems  Constitutional:  Negative for activity change, appetite change, fatigue, fever and unexpected weight change.  HENT:  Negative for congestion, rhinorrhea, sore throat and trouble swallowing.        Clears throat more frequently than in the past  Eyes:  Negative for pain, redness, itching and visual  disturbance.  Respiratory:  Positive for cough. Negative for choking, chest tightness, shortness of breath, wheezing and stridor.   Cardiovascular:  Negative for chest pain and palpitations.  Gastrointestinal:  Negative for abdominal pain, blood in stool, constipation, diarrhea, nausea and vomiting.  Endocrine: Negative for cold intolerance, heat intolerance, polydipsia and polyuria.  Genitourinary:  Negative for difficulty urinating, dysuria, frequency and urgency.  Musculoskeletal:  Negative for arthralgias, joint swelling and myalgias.  Skin:  Negative for pallor and rash.  Neurological:  Negative for dizziness, tremors, weakness, numbness and headaches.  Hematological:  Negative for adenopathy. Does not bruise/bleed easily.  Psychiatric/Behavioral:  Negative  for decreased concentration and dysphoric mood. The patient is not nervous/anxious.       Objective:   Physical Exam Constitutional:      General: He is not in acute distress.    Appearance: Normal appearance. He is well-developed. He is not ill-appearing or diaphoretic.  HENT:     Head: Normocephalic and atraumatic.     Right Ear: Tympanic membrane, ear canal and external ear normal. There is no impacted cerumen.     Left Ear: Tympanic membrane, ear canal and external ear normal. There is no impacted cerumen.     Nose: Nose normal. No congestion or rhinorrhea.     Comments: Nares are boggy Eyes:     General: No scleral icterus.       Right eye: No discharge.        Left eye: No discharge.     Conjunctiva/sclera: Conjunctivae normal.     Pupils: Pupils are equal, round, and reactive to light.  Neck:     Thyroid: No thyromegaly.     Vascular: No carotid bruit or JVD.  Cardiovascular:     Rate and Rhythm: Normal rate and regular rhythm.     Heart sounds: Normal heart sounds.    No gallop.  Pulmonary:     Effort: Pulmonary effort is normal. No respiratory distress.     Breath sounds: Normal breath sounds. No stridor. No  wheezing, rhonchi or rales.     Comments: Good air exch Abdominal:     General: Bowel sounds are normal. There is no distension or abdominal bruit.     Palpations: Abdomen is soft. There is no mass.     Tenderness: There is no abdominal tenderness. There is no guarding or rebound.  Musculoskeletal:     Cervical back: Normal range of motion and neck supple. No tenderness.     Right lower leg: No edema.     Left lower leg: No edema.  Lymphadenopathy:     Cervical: No cervical adenopathy.  Skin:    General: Skin is warm and dry.     Coloration: Skin is not pale.     Findings: No rash.  Neurological:     Mental Status: He is alert.     Coordination: Coordination normal.     Deep Tendon Reflexes: Reflexes are normal and symmetric. Reflexes normal.  Psychiatric:        Mood and Affect: Mood normal.          Assessment & Plan:   Problem List Items Addressed This Visit       Other   Cough - Primary    Chart reviewed and he had similar issue in 2019 (neg cxr at that time) Reassuring exam today He has been on lisinopril since July-? If cause This episode did not start with illness No h/o asthma/copd/gerd or allergies Possible ace cough inst to hold lisinopril and start losartan 25 mg daily  He will call/update if no improvement in a week (cough) and also will need to monitor bp       Elevated BP without diagnosis of hypertension    Pt has taken lisinopril in the past for renal protection and it may have caused a cough  BP Readings from Last 3 Encounters:  12/29/21 (!) 148/78  07/07/21 118/76  03/12/21 120/80    Will change to losartan 25 mg daily and watch bp as well

## 2021-12-29 NOTE — Assessment & Plan Note (Signed)
Pt has taken lisinopril in the past for renal protection and it may have caused a cough  BP Readings from Last 3 Encounters:  12/29/21 (!) 148/78  07/07/21 118/76  03/12/21 120/80    Will change to losartan 25 mg daily and watch bp as well

## 2021-12-29 NOTE — Assessment & Plan Note (Signed)
Chart reviewed and he had similar issue in 2019 (neg cxr at that time) Reassuring exam today He has been on lisinopril since July-? If cause This episode did not start with illness No h/o asthma/copd/gerd or allergies Possible ace cough inst to hold lisinopril and start losartan 25 mg daily  He will call/update if no improvement in a week (cough) and also will need to monitor bp

## 2021-12-29 NOTE — Patient Instructions (Signed)
Stop your lisinopril  In it's place take losaratan 25 mg daily   If your cough does not start to improve in a week please call   If any new symptoms - let us know also   If you notice indigestion /heartburn let us know also

## 2022-01-01 ENCOUNTER — Other Ambulatory Visit: Payer: Medicare Other

## 2022-01-06 NOTE — Progress Notes (Signed)
Subjective:   Xavier Esparza is a 76 y.o. male who presents for Medicare Annual/Subsequent preventive examination.  I connected with Xavier Esparza today by telephone and verified that I am speaking with the correct person using two identifiers. Location patient: home Location provider: work Persons participating in the virtual visit: patient, Marine scientist.    I discussed the limitations, risks, security and privacy concerns of performing an evaluation and management service by telephone and the availability of in person appointments. I also discussed with the patient that there may be a patient responsible charge related to this service. The patient expressed understanding and verbally consented to this telephonic visit.    Interactive audio and video telecommunications were attempted between this provider and patient, however failed, due to patient having technical difficulties OR patient did not have access to video capability.  We continued and completed visit with audio only.  Some vital signs may be absent or patient reported.   Time Spent with patient on telephone encounter: 30 minutes  Review of Systems     Cardiac Risk Factors include: advanced age (>40men, >59 women);dyslipidemia     Objective:    Today's Vitals   01/07/22 1535  Weight: 213 lb (96.6 kg)  Height: 6' (1.829 m)   Body mass index is 28.89 kg/m.  Advanced Directives 01/07/2022 03/12/2021 05/28/2020 05/18/2019 05/11/2018 03/09/2018 09/15/2017  Does Patient Have a Medical Advance Directive? No No No Yes No No No  Type of Advance Directive - - Public librarian;Living will - - -  Copy of [place] in Chart? - - - No - copy requested - - -  Would patient like information on creating a medical advance directive? Yes (MAU/Ambulatory/Procedural Areas - Information given) No - Patient declined No - Patient declined - Yes (MAU/Ambulatory/Procedural Areas - Information given) - No - Patient declined     Current Medications (verified) Outpatient Encounter Medications as of 01/07/2022  Medication Sig   acyclovir (ZOVIRAX) 800 MG tablet TAKE 1 TABLET DAILY   Calcium Carbonate (CALCIUM 600 PO) Take 2 tablets by mouth daily.   GLUCOSAMINE HCL PO Take 1 tablet by mouth daily.    losartan (COZAAR) 25 MG tablet Take 1 tablet (25 mg total) by mouth daily.   Magnesium 500 MG TABS Take 1 tablet by mouth at bedtime.   metFORMIN (GLUCOPHAGE) 500 MG tablet TAKE 1 TABLET DAILY   Multiple Vitamin (MULTIVITAMIN) tablet Take 1 tablet by mouth daily. Reported on 01/07/2016   Omega-3 Fatty Acids ([employer] FISH OIL PO) Take 2 tablets by mouth daily.    Red Yeast Rice 600 MG TABS Take 2 tablets (1,200 mg total) every other day by mouth. Alternate with crestor   rosuvastatin (CRESTOR) 20 MG tablet TAKE 1 TABLET BY MOUTH EVERY OTHER DAY IN THE MORNING   No facility-administered encounter medications on file as of 01/07/2022.    Allergies (verified) Patient has no known allergies.   History: Past Medical History:  Diagnosis Date   Adopted    Cataract    resolved with surgery   Diabetes mellitus    Dyslipidemia    Erectile dysfunction    Herpes labialis    Upper Lip   Hyperlipidemia    Male hypogonadism    Prostate cancer ([place])    Renal stones    Thyroid nodule    Right   Past Surgical History:  Procedure Laterality Date   CATARACT EXTRACTION, BILATERAL  2017   Dr. Katy Fitch  COLONOSCOPY WITH PROPOFOL N/A 03/09/2018   Procedure: COLONOSCOPY WITH PROPOFOL;  Surgeon: Lin Landsman, MD;  Location[facility] END[facility]COPY;  Service: Gastroenterology;  Laterality: N/A;   COLONOSCOPY WITH PROPOFOL N/A 03/12/2021   Procedure: COLONOSCOPY WITH PROPOFOL;  Surgeon: Lin Landsman, MD;  Location: [facility];  Service: Endoscopy;  Laterality: N/A;  priority 4   CYSTOSCOPY W/ URETERAL STENT PLACEMENT  08/09/2017   Procedure: CYSTOSCOPY WITH RETROGRADE PYELOGRAM/URETERAL STENT PLACEMENT;  Surgeon: Royston Cowper, MD;  Location: [facility] ORS;  Service: Urology;;   EXTRACORPOREAL SHOCK WAVE LITHOTRIPSY Left 09/15/2017   Procedure: EXTRACORPOREAL SHOCK WAVE LITHOTRIPSY (ESWL);  Surgeon: Royston Cowper, MD;  Location: [facility] ORS;  Service: Urology;  Laterality: Left;   EYE SURGERY Bilateral March 15, 2015   cataract extractions   GREEN LIGHT LASER TURP (TRANSURETHRAL RESECTION OF PROSTATE N/A 04/20/2016   Procedure: GREEN LIGHT LASER TURP (TRANSURETHRAL RESECTION OF PROSTATE;  Surgeon: Royston Cowper, MD;  Location: [facility] ORS;  Service: Urology;  Laterality: N/A;   INGUINAL HERNIA REPAIR  01/11/2012   Procedure: HERNIA REPAIR INGUINAL ADULT;  Surgeon: Pedro Earls, MD;  Location: [place];  Service: General;  Laterality: Right;  Open right inguinal hernia repair with mesh   POLYPECTOMY  03/12/2021   Procedure: POLYPECTOMY;  Surgeon: Lin Landsman, MD;  Location: [facility];  Service: Endoscopy;;   RADIOACTIVE SEED IMPLANT     PROSTATE   URETEROSCOPY WITH HOLMIUM LASER LITHOTRIPSY Right 08/09/2017   Procedure: URETEROSCOPY WITH HOLMIUM LASER LITHOTRIPSY;  Surgeon: Royston Cowper, MD;  Location: [facility] OR[facility];  Service: Urology;  Laterality: Right;   Family History  Adopted: Yes   Social History   Socioeconomic History   Marital status: Married    Spouse name: Not on file   Number of children: Not on file   Years of education: Not on file   Highest education level: Not on file  Occupational History   Not on file  Tobacco Use   Smoking status: Former    Years: 30.00    Types: Cigarettes    Quit date: 12/27/1990    Years since quitting: 31.0   Smokeless tobacco: Never  Vaping Use   Vaping Use: Never used  Substance and Sexual Activity   Alcohol use: Yes    Comment: Rare Socially   Drug use: No   Sexual activity: Yes  Other Topics Concern   Not on file  Social History Narrative   From [place] then [place]   Married 1971    2 children alive as of 03-14-17, 1 biological child and one stepchild   One child died 03/14/17   Social Determinants of Health   Financial Resource Strain: Low Risk    Difficulty of Paying Living Expenses: Not hard at all  Food Insecurity: No Food Insecurity   Worried About Charity fundraiser in the Last Year: Never true   Arboriculturist in the Last Year: Never true  Transportation Needs: No Transportation Needs   Lack of Transportation (Medical): No   Lack of Transportation (Non-Medical): No  Physical Activity: Insufficiently Active   Days of Exercise per Week: 3 days   Minutes of Exercise per Session: 30 min  Stress: No Stress Concern Present   Feeling of Stress : Not at all  Social Connections: Socially Integrated   Frequency of Communication with Friends and Family: More than three times a week  Frequency of Social Gatherings with Friends and Family: More than three times a week   Attends Religious Services: More than 4 times per year   Active Member of Clubs or Organizations: Yes   Attends Music therapist: More than 4 times per year   Marital Status: Married    Tobacco Counseling Counseling given: Not Answered   Clinical Intake:  Pre-visit preparation completed: Yes  Pain : No/denies pain     BMI - recorded: 28.89 Nutritional Status: BMI 25 -29 Overweight Nutritional Risks: None Diabetes: Yes CBG done?: No Did pt. bring in CBG monitor from home?: No  How often do you need to have someone help you when you read instructions, pamphlets, or other written materials from your doctor or pharmacy?: 1 - Never  Diabetic?No, patient does take metformin   Interpreter Needed?: No  Information entered by :: Orrin Brigham LPN   Activities of Daily Living In your present state of health, do you have any difficulty performing the following activities: 01/07/2022 03/12/2021  Hearing? N N  Vision? N N  Difficulty concentrating or making decisions? N N   Walking or climbing stairs? N N  Dressing or bathing? N N  Doing errands, shopping? N -  Preparing Food and eating ? N -  Using the Toilet? N -  In the past six months, have you accidently leaked urine? N -  Do you have problems with loss of bowel control? N -  Managing your Medications? N -  Managing your Finances? N -  Housekeeping or managing your Housekeeping? N -  Some recent data might be hidden    Patient Care Team: Tonia Ghent, MD as PCP - General (Family Medicine)  Indicate any recent Medical Services you may have received from other than [facility] providers in the past year (date may be approximate).     Assessment:   This is a routine wellness examination for [place].  Hearing/Vision screen Hearing Screening - Comments:: No issues  Vision Screening - Comments:: Last exam   Dietary issues and exercise activities discussed: Current Exercise Habits: Home exercise routine, Type of exercise: strength training/weights;walking, Time (Minutes): 30, Frequency (Times/Week): 3, Weekly Exercise (Minutes/Week): 90   Goals Addressed             This Visit's Progress    Patient Stated       Would like to lose weight and keep exercise routine        Depression Screen PHQ 2/9 Scores 01/07/2022 12/04/2020 05/28/2020 05/18/2019 05/11/2018 08/02/2017 01/07/2016  PHQ - 2 Score 0 0 0 0 0 0 0  PHQ- 9 Score - - 0 0 0 - -    Fall Risk Fall Risk  01/07/2022 12/04/2020 05/28/2020 05/18/2019 05/11/2018  Falls in the past year? 0 0 0 0 No  Number falls in past yr: 0 0 0 - -  Injury with Fall? 0 0 0 - -  Risk for fall due to : No Fall Risks - Medication side effect - -  Follow up Falls prevention discussed Falls evaluation completed Falls evaluation completed;Falls prevention discussed - -    FALL RISK PREVENTION PERTAINING TO THE HOME:  Any stairs in or around the home? Yes  If so, are there any without handrails? No  Home free of loose throw rugs in walkways, pet beds, electrical cords,  etc? Yes  Adequate lighting in your home to reduce risk of falls? Yes   ASSISTIVE DEVICES UTILIZED TO PREVENT FALLS:  Life  alert? No  Use of a cane, walker or w/c? No  Grab bars in the bathroom? No  Shower chair or bench in shower? No  Elevated toilet seat or a handicapped toilet? No   TIMED UP AND GO:  Was the test performed? No .     Cognitive Function: Normal cognitive status assessed by this Nurse Health Advisor. No abnormalities found.   MMSE - Mini Mental State Exam 05/28/2020 05/18/2019 05/11/2018  Orientation to time 5 5 5   Orientation to Place 5 5 5   Registration 3 3 3   Attention/ Calculation 5 0 0  Recall 3 3 3   Language- name 2 objects - 0 0  Language- repeat 1 1 1   Language- follow 3 step command - 0 3  Language- read & follow direction - 0 0  Write a sentence - 0 0  Copy design - 0 0  Total score - 17 20        Immunizations Immunization History  Administered Date(s) Administered   Fluad Quad(high Dose 65+) 08/23/2019, 09/18/2020   Influenza Split 09/21/2011, 09/21/2012   Influenza Whole 09/14/2010   Influenza, High Dose Seasonal PF 11/30/2016   Influenza,inj,Quad PF,6+ Mos 09/26/2013, 11/08/2014, 11/28/2015, 10/13/2017, 10/12/2018   PFIZER Comirnaty(Gray Top)Covid-19 Tri-Sucrose Vaccine 06/15/2021   PFIZER(Purple Top)SARS-COV-2 Vaccination 02/10/2020, 03/02/2020, 09/26/2020   Pneumococcal Conjugate-13 09/26/2013   Pneumococcal Polysaccharide-23 09/17/2010   Td 04/14/2007   Zoster, Live 10/05/2011    TDAP status: Due, Education has been provided regarding the importance of this vaccine. Advised may receive this vaccine at local pharmacy or Health Dept. Aware to provide a copy of the vaccination record if obtained from local pharmacy or Health Dept. Verbalized acceptance and understanding.  Flu Vaccine status: Up to date Patient plans on providing updated vaccine information at next visit Pneumococcal vaccine status: Up to date  Covid-19 vaccine  status: Completed vaccines Patient plans on providing updated vaccine information at next visit Qualifies for Shingles Vaccine? Yes   Zostavax completed Yes   Shingrix Completed?: No.    Education has been provided regarding the importance of this vaccine. Patient has been advised to call insurance company to determine out of pocket expense if they have not yet received this vaccine. Advised may also receive vaccine at local pharmacy or Health Dept. Verbalized acceptance and understanding.  Screening Tests Health Maintenance  Topic Date Due   Zoster Vaccines- Shingrix (1 of 2) Never done   TETANUS/TDAP  04/13/2017   OPHTHALMOLOGY EXAM  11/15/2019   INFLUENZA VACCINE  07/27/2021   COVID-19 Vaccine (5 - Booster for Pfizer series) 08/10/2021   HEMOGLOBIN A1C  01/02/2022   FOOT EXAM  07/07/2022   COLONOSCOPY (Pts 45-51yrs Insurance coverage will need to be confirmed)  03/12/2026   Pneumonia Vaccine 74+ Years old  Completed   Hepatitis C Screening  Completed   HPV VACCINES  Aged Out    Health Maintenance  Health Maintenance Due  Topic Date Due   Zoster Vaccines- Shingrix (1 of 2) Never done   TETANUS/TDAP  04/13/2017   OPHTHALMOLOGY EXAM  11/15/2019   INFLUENZA VACCINE  07/27/2021   COVID-19 Vaccine (5 - Booster for Pfizer series) 08/10/2021   HEMOGLOBIN A1C  01/02/2022    Colorectal cancer screening: Type of screening: Colonoscopy. Completed 03/12/21. Repeat every 5 years  Lung Cancer Screening: (Low Dose CT Chest recommended if Age 8-80 years, 30 pack-year currently smoking OR have quit w/in 15years.) does not qualify.     Additional Screening:  Hepatitis C  Screening: does qualify; Completed 08/02/17  Vision Screening: Recommended annual ophthalmology exams for early detection of glaucoma and other disorders of the eye. Is the patient up to date with their annual eye exam?  Yes  Who is the provider or what is the name of the office in which the patient attends annual eye  exams? Dr. Sabra Heck   Dental Screening: Recommended annual dental exams for proper oral hygiene  Community Resource Referral / Chronic Care Management: CRR required this visit?  No   CCM required this visit?  No      Plan:     I have personally reviewed and noted the following in the patients chart:   Medical and social history Use of alcohol, tobacco or illicit drugs  Current medications and supplements including opioid prescriptions. Patient is not currently taking opioid prescriptions. Functional ability and status Nutritional status Physical activity Advanced directives List of other physicians Hospitalizations, surgeries, and ER visits in previous 12 months Vitals Screenings to include cognitive, depression, and falls Referrals and appointments  In addition, I have reviewed and discussed with patient certain preventive protocols, quality metrics, and best practice recommendations. A written personalized care plan for preventive services as well as general preventive health recommendations were provided to patient.   Due to this being a telephonic visit, the after visit summary with patients personalized plan was offered to patient via mail or my-chart.  Patient preferred to pick up at office at next visit.     Loma Messing, LPN   [id number]   Nurse Health Advisor  Nurse Notes: none

## 2022-01-07 ENCOUNTER — Ambulatory Visit (INDEPENDENT_AMBULATORY_CARE_PROVIDER_SITE_OTHER): Payer: Medicare Other

## 2022-01-07 VITALS — Ht 72.0 in | Wt 213.0 lb

## 2022-01-07 DIAGNOSIS — Z0000 Encounter for general adult medical examination without abnormal findings: Secondary | ICD-10-CM

## 2022-01-07 NOTE — Patient Instructions (Signed)
Xavier Esparza , Thank you for taking time to complete your Medicare Wellness Visit. I appreciate your ongoing commitment to your health goals. Please review the following plan we discussed and let me know if I can assist you in the future.   Screening recommendations/referrals: Colonoscopy: no longer required Recommended yearly ophthalmology/optometry visit for glaucoma screening and checkup Recommended yearly dental visit for hygiene and checkup  Vaccinations: Influenza vaccine: up to date, please bring vaccine information to your next appointment Pneumococcal vaccine: up to date Tdap vaccine: Due-last completed 04/14/07, May obtain vaccine at our office or your local pharmacy. Shingles vaccine: Discuss with pharmacy   Covid-19: up to date, please bring vaccine information to your next appointment  Advanced directives: information available at your next appointment  Conditions/risks identified: see problem list  Next appointment: Follow up in one year for your annual wellness visit. 01/10/23 @ 10:30am, this will be a telephone visit  Preventive Care 77 Years and Older, Male Preventive care refers to lifestyle choices and visits with your health care provider that can promote health and wellness. What does preventive care include? A yearly physical exam. This is also called an annual well check. Dental exams once or twice a year. Routine eye exams. Ask your health care provider how often you should have your eyes checked. Personal lifestyle choices, including: Daily care of your teeth and gums. Regular physical activity. Eating a healthy diet. Avoiding tobacco and drug use. Limiting alcohol use. Practicing safe sex. Taking low doses of aspirin every day. Taking vitamin and mineral supplements as recommended by your health care provider. What happens during an annual well check? The services and screenings done by your health care provider during your annual well check will depend on  your age, overall health, lifestyle risk factors, and family history of disease. Counseling  Your health care provider may ask you questions about your: Alcohol use. Tobacco use. Drug use. Emotional well-being. Home and relationship well-being. Sexual activity. Eating habits. History of falls. Memory and ability to understand (cognition). Work and work Statistician. Screening  You may have the following tests or measurements: Height, weight, and BMI. Blood pressure. Lipid and cholesterol levels. These may be checked every 5 years, or more frequently if you are over 55 years old. Skin check. Lung cancer screening. You may have this screening every year starting at age 77 if you have a 30-pack-year history of smoking and currently smoke or have quit within the past 15 years. Fecal occult blood test (FOBT) of the stool. You may have this test every year starting at age 77. Flexible sigmoidoscopy or colonoscopy. You may have a sigmoidoscopy every 5 years or a colonoscopy every 10 years starting at age 77. Prostate cancer screening. Recommendations will vary depending on your family history and other risks. Hepatitis C blood test. Hepatitis B blood test. Sexually transmitted disease (STD) testing. Diabetes screening. This is done by checking your blood sugar (glucose) after you have not eaten for a while (fasting). You may have this done every 1-3 years. Abdominal aortic aneurysm (AAA) screening. You may need this if you are a current or former smoker. Osteoporosis. You may be screened starting at age 77 if you are at high risk. Talk with your health care provider about your test results, treatment options, and if necessary, the need for more tests. Vaccines  Your health care provider may recommend certain vaccines, such as: Influenza vaccine. This is recommended every year. Tetanus, diphtheria, and acellular pertussis (Tdap, Td) vaccine. You  may need a Td booster every 10 years. Zoster  vaccine. You may need this after age 8. Pneumococcal 13-valent conjugate (PCV13) vaccine. One dose is recommended after age 77. Pneumococcal polysaccharide (PPSV23) vaccine. One dose is recommended after age 77. Talk to your health care provider about which screenings and vaccines you need and how often you need them. This information is not intended to replace advice given to you by your health care provider. Make sure you discuss any questions you have with your health care provider. Document Released: 01/09/2016 Document Revised: 09/01/2016 Document Reviewed: 10/14/2015 Elsevier Interactive Patient Education  2017 [place] Prevention in the Home Falls can cause injuries. They can happen to people of all ages. There are many things you can do to make your home safe and to help prevent falls. What can I do on the outside of my home? Regularly fix the edges of walkways and driveways and fix any cracks. Remove anything that might make you trip as you walk through a door, such as a raised step or threshold. Trim any bushes or trees on the path to your home. Use bright outdoor lighting. Clear any walking paths of anything that might make someone trip, such as rocks or tools. Regularly check to see if handrails are loose or broken. Make sure that both sides of any steps have handrails. Any raised decks and porches should have guardrails on the edges. Have any leaves, snow, or ice cleared regularly. Use sand or salt on walking paths during winter. Clean up any spills in your garage right away. This includes oil or grease spills. What can I do in the bathroom? Use night lights. Install grab bars by the toilet and in the tub and shower. Do not use towel bars as grab bars. Use non-skid mats or decals in the tub or shower. If you need to sit down in the shower, use a plastic, non-slip stool. Keep the floor dry. Clean up any water that spills on the floor as soon as it happens. Remove  soap buildup in the tub or shower regularly. Attach bath mats securely with double-sided non-slip rug tape. Do not have throw rugs and other things on the floor that can make you trip. What can I do in the bedroom? Use night lights. Make sure that you have a light by your bed that is easy to reach. Do not use any sheets or blankets that are too big for your bed. They should not hang down onto the floor. Have a firm chair that has side arms. You can use this for support while you get dressed. Do not have throw rugs and other things on the floor that can make you trip. What can I do in the kitchen? Clean up any spills right away. Avoid walking on wet floors. Keep items that you use a lot in easy-to-reach places. If you need to reach something above you, use a strong step stool that has a grab bar. Keep electrical cords out of the way. Do not use floor polish or wax that makes floors slippery. If you must use wax, use non-skid floor wax. Do not have throw rugs and other things on the floor that can make you trip. What can I do with my stairs? Do not leave any items on the stairs. Make sure that there are handrails on both sides of the stairs and use them. Fix handrails that are broken or loose. Make sure that handrails are as long as the  stairways. Check any carpeting to make sure that it is firmly attached to the stairs. Fix any carpet that is loose or worn. Avoid having throw rugs at the top or bottom of the stairs. If you do have throw rugs, attach them to the floor with carpet tape. Make sure that you have a light switch at the top of the stairs and the bottom of the stairs. If you do not have them, ask someone to add them for you. What else can I do to help prevent falls? Wear shoes that: Do not have high heels. Have rubber bottoms. Are comfortable and fit you well. Are closed at the toe. Do not wear sandals. If you use a stepladder: Make sure that it is fully opened. Do not climb a  closed stepladder. Make sure that both sides of the stepladder are locked into place. Ask someone to hold it for you, if possible. Clearly mark and make sure that you can see: Any grab bars or handrails. First and last steps. Where the edge of each step is. Use tools that help you move around (mobility aids) if they are needed. These include: Canes. Walkers. Scooters. Crutches. Turn on the lights when you go into a dark area. Replace any light bulbs as soon as they burn out. Set up your furniture so you have a clear path. Avoid moving your furniture around. If any of your floors are uneven, fix them. If there are any pets around you, be aware of where they are. Review your medicines with your doctor. Some medicines can make you feel dizzy. This can increase your chance of falling. Ask your doctor what other things that you can do to help prevent falls. This information is not intended to replace advice given to you by your health care provider. Make sure you discuss any questions you have with your health care provider. Document Released: 10/09/2009 Document Revised: 05/20/2016 Document Reviewed: 01/17/2015 Elsevier Interactive Patient Education  2017 [employer].

## 2022-01-08 ENCOUNTER — Encounter: Payer: Self-pay | Admitting: Family Medicine

## 2022-01-08 ENCOUNTER — Other Ambulatory Visit: Payer: Self-pay

## 2022-01-08 ENCOUNTER — Ambulatory Visit (INDEPENDENT_AMBULATORY_CARE_PROVIDER_SITE_OTHER): Payer: Medicare Other | Admitting: Family Medicine

## 2022-01-08 ENCOUNTER — Ambulatory Visit: Payer: Medicare Other | Admitting: Family Medicine

## 2022-01-08 DIAGNOSIS — R053 Chronic cough: Secondary | ICD-10-CM

## 2022-01-08 DIAGNOSIS — Z7189 Other specified counseling: Secondary | ICD-10-CM

## 2022-01-08 DIAGNOSIS — E119 Type 2 diabetes mellitus without complications: Secondary | ICD-10-CM | POA: Diagnosis not present

## 2022-01-08 DIAGNOSIS — Z0000 Encounter for general adult medical examination without abnormal findings: Secondary | ICD-10-CM

## 2022-01-08 DIAGNOSIS — R739 Hyperglycemia, unspecified: Secondary | ICD-10-CM | POA: Diagnosis not present

## 2022-01-08 LAB — HEMOGLOBIN A1C: Hgb A1c MFr Bld: 6.2 % (ref 4.6–6.5)

## 2022-01-08 LAB — BASIC METABOLIC PANEL
BUN: 17 mg/dL (ref 6–23)
CO2: 31 mEq/L (ref 19–32)
Calcium: 9.6 mg/dL (ref 8.4–10.5)
Chloride: 101 mEq/L (ref 96–112)
Creatinine, Ser: 1.22 mg/dL (ref 0.40–1.50)
GFR: 57.49 mL/min — ABNORMAL LOW (ref >60.00)
Glucose, Bld: 95 mg/dL (ref 70–99)
Potassium: 4.8 mEq/L (ref 3.5–5.1)
Sodium: 137 mEq/L (ref 135–145)

## 2022-01-08 LAB — MICROALBUMIN / CREATININE URINE RATIO
Creatinine,U: 165.5 mg/dL
Microalb Creat Ratio: 2.6 mg/g (ref 0.0–30.0)
Microalb, Ur: 4.3 mg/dL — ABNORMAL HIGH (ref 0.0–1.9)

## 2022-01-08 MED ORDER — RED YEAST RICE 600 MG PO TABS: 2.0000 | ORAL_TABLET | Freq: Every day | ORAL | Status: DC

## 2022-01-08 NOTE — Patient Instructions (Addendum)
Check with your insurance to see if they will cover the shingles shot and the tetanus shot.   Go to the lab on the way out.   If you have mychart we'll likely use that to update you.    Take care.  Glad to see you.  Plan on recheck in about 6 months.  We can check sugar and lipids prior to the visit- fasting lab visit.   Let me know if the cough persists or gets worse.

## 2022-01-08 NOTE — Progress Notes (Signed)
This visit occurred during the SARS-CoV-2 public health emergency.  Safety protocols were in place, including screening questions prior to the visit, additional usage of staff PPE, and extensive cleaning of exam room while observing appropriate contact time as indicated for disinfecting solutions.  Diabetes vs hyperglycemia.   Using medications without difficulties: yes Hypoglycemic episodes: no sx Hyperglycemic episodes: no sx Feet problems: no  Blood Sugars averaging: not checked at home.  Labs pending.  He is still working on diet and exercise.   eye exam within last year: done at Dr. Ammie Ferrier office in the last year.    On ARB.    Using medication without problems or lightheadedness: yes Chest pain with exertion:no Edema:no Short of breath:no Cough.  Off lisinopril now.  On ARB.  Possibility of ACE related cough discussed with patient.  Cough is less common now.  Not totally resolved.  No fevers. No discolored sputum.  Sometimes cough is dry, about 50/50.  Still with some throat clearing.  Noted when he eats peanuts, but not eating peanut butter.  Can happen with other types of nuts also.  He does not have a nut allergy in terms of lip swelling or itching.    Tetanus d/w pt.  Shingles d/w pt.   Flu 2022 PNA up to date.  covid vaccine 2022 PSA per urology.   Wife designated if patient were incapacitated.  He would have [employer] designated next if his wife were incapacitated.   Colonoscopy 2022  PMH and SH reviewed.   Vital signs, Meds and allergies reviewed.  ROS: Per HPI unless specifically indicated in ROS section   GEN: nad, alert and oriented HEENT: ncat NECK: supple w/o LA CV: rrr.  PULM: ctab, no inc wob ABD: soft, +bs EXT: no edema SKIN: Well-perfused.  30 minutes were devoted to patient care in this encounter (this includes time spent reviewing the patient's file/history, interviewing and examining the patient, counseling/reviewing plan with patient).

## 2022-01-10 NOTE — Assessment & Plan Note (Signed)
Tetanus d/w pt.  Shingles d/w pt.   Flu 2022 PNA up to date.  covid vaccine 2022 PSA per urology.   Wife designated if patient were incapacitated.  He would have [employer] designated next if his wife were incapacitated.   Colonoscopy 2022

## 2022-01-10 NOTE — Assessment & Plan Note (Addendum)
He may not be diabetic, based on his A1c and current metformin use.  Continue metformin for now.  Discussed options.  See notes on labs. Plan on recheck in about 6 months.  We can check sugar and lipids prior to the visit- fasting lab visit.

## 2022-01-10 NOTE — Assessment & Plan Note (Addendum)
Discussed with patient.  Would observe for now.  He will update me if this continues.  Lungs are clear.  He could have a GERD component related to nuts but it does not sound like he has a peanut allergy.  Would continue ARB.  He is off lisinopril currently.

## 2022-01-10 NOTE — Assessment & Plan Note (Signed)
Wife designated if patient were incapacitated.  He would have [employer] designated next if his wife were incapacitated.

## 2022-01-23 ENCOUNTER — Other Ambulatory Visit: Payer: Self-pay | Admitting: Family Medicine

## 2022-03-19 ENCOUNTER — Other Ambulatory Visit: Payer: Self-pay

## 2022-03-19 ENCOUNTER — Telehealth: Payer: Self-pay

## 2022-03-19 ENCOUNTER — Ambulatory Visit (INDEPENDENT_AMBULATORY_CARE_PROVIDER_SITE_OTHER): Payer: Medicare Other | Admitting: Nurse Practitioner

## 2022-03-19 VITALS — BP 118/76 | HR 62 | Temp 97.6°F | Resp 14 | Ht 72.0 in | Wt 216.5 lb

## 2022-03-19 DIAGNOSIS — J020 Streptococcal pharyngitis: Secondary | ICD-10-CM | POA: Diagnosis not present

## 2022-03-19 DIAGNOSIS — J029 Acute pharyngitis, unspecified: Secondary | ICD-10-CM

## 2022-03-19 LAB — POCT RAPID STREP A (OFFICE): Rapid Strep A Screen: POSITIVE — AB

## 2022-03-19 MED ORDER — AMOXICILLIN 500 MG PO CAPS: 500.0000 mg | ORAL_CAPSULE | Freq: Two times a day (BID) | ORAL | 0 refills | Status: AC

## 2022-03-19 NOTE — Patient Instructions (Addendum)
Nice to see you today ?I sent the antibiotic to the pharmacy ?Follow up if no improvement  ?

## 2022-03-19 NOTE — Assessment & Plan Note (Signed)
Strep test in office positive. ?

## 2022-03-19 NOTE — Telephone Encounter (Signed)
Patient seen in office and evaluated ?

## 2022-03-19 NOTE — Assessment & Plan Note (Signed)
Patient positive for strep pharyngitis in office.  We will start patient on amoxicillin 500 mg twice daily for 10 days.  Did review signs and symptoms when to be seen urgent or emergently.  Follow-up if no improvement.  Patient can continue over-the-counter analgesics such as cough drops, Tylenol, warm salt water gargles. ?

## 2022-03-19 NOTE — Progress Notes (Signed)
? ?Acute Office Visit ? ?Subjective:  ? ? Patient ID: Xavier Esparza, male    DOB: 08/07/1946, 76 y.o.   MRN: [id number] ? ?Chief Complaint  ?Patient presents with  ? Sore Throat  ?  Started on 03/18/22, sore throat, yesterday had slight runny nose. No fever or headache. covid test negative at home today 03/19/22  ? ? ? ?Patient is in today for sore throat ? ?Symptoms started yesterday morning ?States it is a little better today ?No sick contacts per patient report ?Covid test today that was negative ?Flu vaccine utd ?Covid vaccines utd ? ?Been doing cough drops. ? ?Past Medical History:  ?Diagnosis Date  ? Adopted   ? Cataract   ? resolved with surgery  ? Diabetes mellitus   ? Dyslipidemia   ? Erectile dysfunction   ? Herpes labialis   ? Upper Lip  ? Hyperlipidemia   ? Male hypogonadism   ? Prostate cancer ([place])   ? Renal stones   ? Thyroid nodule   ? Right  ? ? ?Past Surgical History:  ?Procedure Laterality Date  ? CATARACT EXTRACTION, BILATERAL  2017  ? Dr. Katy Fitch  ? COLONOSCOPY WITH PROPOFOL N/A 03/09/2018  ? Procedure: COLONOSCOPY WITH PROPOFOL;  Surgeon: Lin Landsman, MD;  Location: [facility] ENDOSCOPY;  Service: Gastroenterology;  Laterality: N/A;  ? COLONOSCOPY WITH PROPOFOL N/A 03/12/2021  ? Procedure: COLONOSCOPY WITH PROPOFOL;  Surgeon: Lin Landsman, MD;  Location: [place];  Service: Endoscopy;  Laterality: N/A;  priority 4  ? CYSTOSCOPY W/ URETERAL STENT PLACEMENT  08/09/2017  ? Procedure: CYSTOSCOPY WITH RETROGRADE PYELOGRAM/URETERAL STENT PLACEMENT;  Surgeon: Royston Cowper, MD;  Location: [facility] ORS;  Service: Urology;;  ? [place] LITHOTRIPSY Left 09/15/2017  ? Procedure: EXTRACORPOREAL SHOCK WAVE LITHOTRIPSY (ESWL);  Surgeon: Royston Cowper, MD;  Location: [facility] ORS;  Service: Urology;  Laterality: Left;  ? EYE SURGERY Bilateral 2016  ? cataract extractions  ? GREEN LIGHT LASER TURP (TRANSURETHRAL RESECTION OF PROSTATE N/A 04/20/2016  ? Procedure: GREEN LIGHT LASER TURP  (TRANSURETHRAL RESECTION OF PROSTATE;  Surgeon: Royston Cowper, MD;  Location: [facility] ORS;  Service: Urology;  Laterality: N/A;  ? INGUINAL HERNIA REPAIR  01/11/2012  ? Procedure: HERNIA REPAIR INGUINAL ADULT;  Surgeon: Pedro Earls, MD;  Location: [place];  Service: General;  Laterality: Right;  Open right inguinal hernia repair with mesh  ? POLYPECTOMY  03/12/2021  ? Procedure: POLYPECTOMY;  Surgeon: Lin Landsman, MD;  Location: [place];  Service: Endoscopy;;  ? RADIOACTIVE SEED IMPLANT    ? PROSTATE  ? URETEROSCOPY WITH HOLMIUM LASER LITHOTRIPSY Right 08/09/2017  ? Procedure: URETEROSCOPY WITH HOLMIUM LASER LITHOTRIPSY;  Surgeon: Royston Cowper, MD;  Location: [facility] ORS;  Service: Urology;  Laterality: Right;  ? ? ?Family History  ?Adopted: Yes  ? ? ?Social History  ? ?Socioeconomic History  ? Marital status: Married  ?  Spouse name: Not on file  ? Number of children: Not on file  ? Years of education: Not on file  ? Highest education level: Not on file  ?Occupational History  ? Not on file  ?Tobacco Use  ? Smoking status: Former  ?  Years: 30.00  ?  Types: Cigarettes  ?  Quit date: 12/27/1990  ?  Years since quitting: 31.2  ? Smokeless tobacco: Never  ?Vaping Use  ? Vaping Use: Never used  ?Substance and Sexual Activity  ? Alcohol use: Yes  ?  Comment: Rare Socially  ? Drug use: No  ? Sexual activity: Yes  ?Other Topics Concern  ? Not on file  ?Social History Narrative  ? From [facility]  ? Insurance sales  ? Attended [employer] then [employer]  ? Married 1971  ? 2 children alive as of 2017-03-29, 1 biological child and one stepchild  ? One child died 2017-03-29  ? ?Social Determinants of Health  ? ?Financial Resource Strain: Low Risk   ? Difficulty of Paying Living Expenses: Not hard at all  ?Food Insecurity: No Food Insecurity  ? Worried About Charity fundraiser in the Last Year: Never true  ? Ran Out of Food in the Last Year: Never true  ?Transportation Needs: No Transportation Needs  ?  Lack of Transportation (Medical): No  ? Lack of Transportation (Non-Medical): No  ?Physical Activity: Insufficiently Active  ? Days of Exercise per Week: 3 days  ? Minutes of Exercise per Session: 30 min  ?Stress: No Stress Concern Present  ? Feeling of Stress : Not at all  ?Social Connections: Socially Integrated  ? Frequency of Communication with Friends and Family: More than three times a week  ? Frequency of Social Gatherings with Friends and Family: More than three times a week  ? Attends Religious Services: More than 4 times per year  ? Active Member of Clubs or Organizations: Yes  ? Attends Archivist Meetings: More than 4 times per year  ? Marital Status: Married  ?Intimate Partner Violence: Not At Risk  ? Fear of Current or Ex-Partner: No  ? Emotionally Abused: No  ? Physically Abused: No  ? Sexually Abused: No  ? ? ?Outpatient Medications Prior to Visit  ?Medication Sig Dispense Refill  ? acyclovir (ZOVIRAX) 800 MG tablet TAKE 1 TABLET DAILY 90 tablet 1  ? Calcium Carbonate (CALCIUM 600 PO) Take 2 tablets by mouth daily.    ? GLUCOSAMINE HCL PO Take 1 tablet by mouth daily.     ? losartan (COZAAR) 25 MG tablet Take 1 tablet (25 mg total) by mouth daily. 90 tablet 0  ? Magnesium 500 MG TABS Take 1 tablet by mouth at bedtime.    ? metFORMIN (GLUCOPHAGE) 500 MG tablet TAKE 1 TABLET DAILY 90 tablet 1  ? Multiple Vitamin (MULTIVITAMIN) tablet Take 1 tablet by mouth daily. Reported on 01/07/2016    ? Omega-3 Fatty Acids ([employer] FISH OIL PO) Take 2 tablets by mouth daily.     ? Red Yeast Rice 600 MG TABS Take 2 tablets (1,200 mg total) by mouth daily.    ? rosuvastatin (CRESTOR) 20 MG tablet TAKE 1 TABLET BY MOUTH EVERY OTHER DAY IN THE MORNING 90 tablet 3  ? ?No facility-administered medications prior to visit.  ? ? ?No Known Allergies ? ?Review of Systems  ?Constitutional:  Positive for fatigue. Negative for chills and fever.  ?HENT:  Positive for sore throat. Negative for congestion, ear discharge, ear  pain, rhinorrhea, sinus pressure and sinus pain.   ?Respiratory:  Positive for cough (intermittent base line per patinet). Negative for shortness of breath.   ?Cardiovascular:  Negative for chest pain.  ?Gastrointestinal:  Negative for diarrhea, nausea and vomiting.  ?Musculoskeletal:  Positive for myalgias (yesterday moring that abated). Negative for arthralgias.  ?Neurological:  Negative for headaches.  ? ?   ?Objective:  ?  ?Physical Exam ?Vitals and nursing note reviewed.  ?Constitutional:   ?   Appearance: He is well-developed.  ?HENT:  ?  Right Ear: Tympanic membrane, ear canal and external ear normal.  ?   Left Ear: Tympanic membrane, ear canal and external ear normal.  ?   Nose:  ?   Right Sinus: No maxillary sinus tenderness or frontal sinus tenderness.  ?   Left Sinus: No maxillary sinus tenderness or frontal sinus tenderness.  ?   Mouth/Throat:  ?   Mouth: Mucous membranes are moist.  ?   Pharynx: Oropharynx is clear. Posterior oropharyngeal erythema present.  ?Cardiovascular:  ?   Rate and Rhythm: Normal rate and regular rhythm.  ?   Heart sounds: Normal heart sounds.  ?Pulmonary:  ?   Effort: Pulmonary effort is normal.  ?   Breath sounds: Normal breath sounds.  ?Lymphadenopathy:  ?   Cervical: Cervical adenopathy present.  ?Neurological:  ?   Mental Status: He is alert.  ? ? ?BP 118/76   Pulse 62   Temp 97.6 ?F (36.4 ?C)   Resp 14   Ht 6' (1.829 m)   Wt 216 lb 8 oz (98.2 kg)   SpO2 97%   BMI 29.36 kg/m?  ?Wt Readings from Last 3 Encounters:  ?03/19/22 216 lb 8 oz (98.2 kg)  ?01/08/22 210 lb (95.3 kg)  ?01/07/22 213 lb (96.6 kg)  ? ? ?Health Maintenance Due  ?Topic Date Due  ? Zoster Vaccines- Shingrix (1 of 2) Never done  ? TETANUS/TDAP  04/13/2017  ? OPHTHALMOLOGY EXAM  11/15/2019  ? COVID-19 Vaccine (6 - Booster for Pfizer series) 12/04/2021  ? ? ?There are no preventive care reminders to display for this patient. ? ? ?Lab Results  ?Component Value Date  ? TSH 0.91 11/28/2015  ? ?Lab Results   ?Component Value Date  ? WBC 6.3 11/28/2015  ? HGB 14.6 11/28/2015  ? HCT 45.0 11/28/2015  ? MCV 84.9 11/28/2015  ? PLT 288.0 11/28/2015  ? ?Lab Results  ?Component Value Date  ? NA 137 01/08/2022  ? K 4.8 01/

## 2022-03-19 NOTE — Telephone Encounter (Signed)
Christy RN at [facility] transferred pt to our office; pt has S/T that is painful to swallow that started on 03/18/22. No other covid symptoms and pt is going to do covid test prior to appt today at 10:40 with Romilda Garret NP. Sending note to Romilda Garret NP and Anastasiya CMA. Will add access note when available. ?

## 2022-03-19 NOTE — Telephone Encounter (Signed)
[facility] - Client ?TELEPHONE ADVICE RECORD ?Access[facility]urse? ?Patient ?Name: ?Xavier DAVI ?Esparza ?Gender: Male ?DOB: 10/07/1946 ?Age: 76 Y 61 M 14 ?D ?Return ?Phone ?Number: ?[phone] ?(Primary), ?[id number] ?(Secondary) ?Address: ?City/ ?State/ ?Zip: ?[place] ? [address] ?Client [place] [facility] - Client ?Client Site [place] ?Provider Renford Dills - MD ?Contact Type Call ?Who Is Calling Patient / Member / Family / Caregiver ?Call Type Triage / Clinical ?Relationship To Patient Self ?Return Phone Number [phone] (Primary) ?Chief Complaint Swallowing Difficulty ?Reason for Call Symptomatic / Request for Health Information ?Initial Comment Caller has been having a sore throat for a few days ?now, difficulty swallowing and it is painful. ?Translation No ?Nurse Assessment ?Nurse: Terence Lux, RN, Christine Date/Time (Eastern Time): 03/19/2022 8:03:51 AM ?Confirm and document reason for call. If ?symptomatic, describe symptoms. ?---Caller has been having a sore throat for a few days ?now, difficulty swallowing and it is painful. Has been ?taking OTC remedies but pain upon swallowing. Temp ?97.6 ?Does the patient have any new or worsening ?symptoms? ---Yes ?Will a triage be completed? ---Yes ?Related visit to physician within the last 2 weeks? ---No ?Does the PT have any chronic conditions? (i.e. ?diabetes, asthma, this includes High risk factors for ?pregnancy, etc.) ?---No ?Is this a behavioral health or substance abuse call? ---No ?Guidelines ?Guideline Title Affirmed Question Affirmed Notes Nurse Date/Time (Eastern ?Time) ?Sore Throat History of rheumatic ?fever ?Terence Lux, RN, ?Christine ?03/19/2022 8:05:39 ?AM ?Disp. Time (Eastern ?Time) Disposition Final User ?03/19/2022 8:10:58 AM See PCP within 24 Hours Yes Terence Lux, RN, Altha Harm ?Caller Disagree/Comply Comply ?PLEASE NOTE: All timestamps contained within this report are  represented as [place] Standard Time. ?CONFIDENTIALTY NOTICE: This fax transmission is intended only for the addressee. It contains information that is legally privileged, confidential or ?otherwise protected from use or disclosure. If you are not the intended recipient, you are strictly prohibited from reviewing, disclosing, copying using ?or disseminating any of this information or taking any action in reliance on or regarding this information. If you have received this fax in error, please ?notify us immediately by telephone so that we can arrange for its return to [place]. Phone: [phone], Toll-Free: [phone], Fax: [phone] ?Page: 2 of 2 ?Call Id: [id number] ?Caller Understands Yes ?PreDisposition Call Doctor ?Care Advice Given Per Guideline ?SEE PCP WITHIN 24 HOURS: * IF OFFICE WILL BE OPEN: You need to be examined within the next 24 hours. Call your doctor ?(or NP/PA) when the office opens and make an appointment. SORE THROAT: * Here are some simple things you can do to treat ?and reduce sore throat pain. * Sip warm chicken broth or apple juice. * Suck on hard candy or an over-the-counter throat lozenge. * ?Gargle with warm salt water four times a day. To make salt water, put 1/2 teaspoon of salt in 8 oz (240 ml) of warm water. * Avoid ?cigarette smoke. * ACETAMINOPHEN - EXTRA STRENGTH TYLENOL: Take 1,000 mg (two 500 mg pills) every 6 to 8 hours as ?needed. Each Extra Strength Tylenol pill has 500 mg of acetaminophen. The most you should take is 6 pills a day (3,000 mg total). ?Note: In [place], the maximum is 8 pills a day (4,000 mg total). * IBUPROFEN (E.G., MOTRIN, ADVIL): Take 400 mg (two 200 ?mg pills) by mouth every 6 hours. The most you should take is 6 pills a day (1,200 mg total). CALL BACK IF: * You become worse ?CARE ADVICE given per  Sore Throat (Adult) guideline. ?Comments ?User: Hughie Closs, RN Date/Time Eilene Ghazi Time): 03/19/2022 8:15:20 AM ?transferred to office for  appt ?Referrals ?REFERRED TO PCP OFFIC ?

## 2022-04-11 ENCOUNTER — Other Ambulatory Visit: Payer: Self-pay | Admitting: Family Medicine

## 2022-06-04 ENCOUNTER — Other Ambulatory Visit: Payer: Self-pay | Admitting: Urology

## 2022-06-04 DIAGNOSIS — R310 Gross hematuria: Secondary | ICD-10-CM

## 2022-06-08 ENCOUNTER — Emergency Department: Payer: Medicare Other

## 2022-06-08 ENCOUNTER — Ambulatory Visit: Admission: RE | Admit: 2022-06-08 | Payer: Medicare Other | Source: Ambulatory Visit

## 2022-06-08 ENCOUNTER — Inpatient Hospital Stay
Admission: EM | Admit: 2022-06-08 | Discharge: 2022-06-29 | DRG: 871 | Discharge status: Against Medical Advice | Payer: Medicare Other | Attending: Internal Medicine | Admitting: Internal Medicine

## 2022-06-08 ENCOUNTER — Encounter: Payer: Self-pay | Admitting: Emergency Medicine

## 2022-06-08 ENCOUNTER — Other Ambulatory Visit: Payer: Self-pay

## 2022-06-08 DIAGNOSIS — I953 Hypotension of hemodialysis: Secondary | ICD-10-CM | POA: Diagnosis not present

## 2022-06-08 DIAGNOSIS — E785 Hyperlipidemia, unspecified: Secondary | ICD-10-CM | POA: Diagnosis present

## 2022-06-08 DIAGNOSIS — D62 Acute posthemorrhagic anemia: Secondary | ICD-10-CM | POA: Diagnosis not present

## 2022-06-08 DIAGNOSIS — R6520 Severe sepsis without septic shock: Secondary | ICD-10-CM | POA: Diagnosis present

## 2022-06-08 DIAGNOSIS — I81 Portal vein thrombosis: Secondary | ICD-10-CM | POA: Diagnosis present

## 2022-06-08 DIAGNOSIS — K8042 Calculus of bile duct with acute cholecystitis without obstruction: Secondary | ICD-10-CM | POA: Diagnosis present

## 2022-06-08 DIAGNOSIS — Z515 Encounter for palliative care: Secondary | ICD-10-CM

## 2022-06-08 DIAGNOSIS — R160 Hepatomegaly, not elsewhere classified: Secondary | ICD-10-CM

## 2022-06-08 DIAGNOSIS — Z9842 Cataract extraction status, left eye: Secondary | ICD-10-CM

## 2022-06-08 DIAGNOSIS — Z20822 Contact with and (suspected) exposure to covid-19: Secondary | ICD-10-CM | POA: Diagnosis present

## 2022-06-08 DIAGNOSIS — K567 Ileus, unspecified: Secondary | ICD-10-CM | POA: Diagnosis not present

## 2022-06-08 DIAGNOSIS — J9811 Atelectasis: Secondary | ICD-10-CM | POA: Diagnosis present

## 2022-06-08 DIAGNOSIS — Y838 Other surgical procedures as the cause of abnormal reaction of the patient, or of later complication, without mention of misadventure at the time of the procedure: Secondary | ICD-10-CM | POA: Diagnosis not present

## 2022-06-08 DIAGNOSIS — K8591 Acute pancreatitis with uninfected necrosis, unspecified: Secondary | ICD-10-CM | POA: Diagnosis not present

## 2022-06-08 DIAGNOSIS — G928 Other toxic encephalopathy: Secondary | ICD-10-CM | POA: Diagnosis not present

## 2022-06-08 DIAGNOSIS — D72829 Elevated white blood cell count, unspecified: Secondary | ICD-10-CM | POA: Diagnosis not present

## 2022-06-08 DIAGNOSIS — G9341 Metabolic encephalopathy: Secondary | ICD-10-CM | POA: Diagnosis present

## 2022-06-08 DIAGNOSIS — E1129 Type 2 diabetes mellitus with other diabetic kidney complication: Secondary | ICD-10-CM

## 2022-06-08 DIAGNOSIS — M6282 Rhabdomyolysis: Secondary | ICD-10-CM | POA: Diagnosis present

## 2022-06-08 DIAGNOSIS — K2970 Gastritis, unspecified, without bleeding: Secondary | ICD-10-CM | POA: Diagnosis present

## 2022-06-08 DIAGNOSIS — K264 Chronic or unspecified duodenal ulcer with hemorrhage: Secondary | ICD-10-CM | POA: Diagnosis present

## 2022-06-08 DIAGNOSIS — R7989 Other specified abnormal findings of blood chemistry: Secondary | ICD-10-CM | POA: Diagnosis present

## 2022-06-08 DIAGNOSIS — T508X5A Adverse effect of diagnostic agents, initial encounter: Secondary | ICD-10-CM | POA: Diagnosis present

## 2022-06-08 DIAGNOSIS — I700 Atherosclerosis of aorta: Secondary | ICD-10-CM | POA: Diagnosis present

## 2022-06-08 DIAGNOSIS — Z789 Other specified health status: Secondary | ICD-10-CM | POA: Diagnosis not present

## 2022-06-08 DIAGNOSIS — N200 Calculus of kidney: Secondary | ICD-10-CM

## 2022-06-08 DIAGNOSIS — T8241XA Breakdown (mechanical) of vascular dialysis catheter, initial encounter: Secondary | ICD-10-CM | POA: Diagnosis not present

## 2022-06-08 DIAGNOSIS — R682 Dry mouth, unspecified: Secondary | ICD-10-CM | POA: Diagnosis not present

## 2022-06-08 DIAGNOSIS — I4510 Unspecified right bundle-branch block: Secondary | ICD-10-CM | POA: Diagnosis present

## 2022-06-08 DIAGNOSIS — N186 End stage renal disease: Secondary | ICD-10-CM | POA: Diagnosis not present

## 2022-06-08 DIAGNOSIS — Z7984 Long term (current) use of oral hypoglycemic drugs: Secondary | ICD-10-CM

## 2022-06-08 DIAGNOSIS — K851 Biliary acute pancreatitis without necrosis or infection: Secondary | ICD-10-CM | POA: Diagnosis not present

## 2022-06-08 DIAGNOSIS — R188 Other ascites: Secondary | ICD-10-CM

## 2022-06-08 DIAGNOSIS — N179 Acute kidney failure, unspecified: Secondary | ICD-10-CM | POA: Diagnosis present

## 2022-06-08 DIAGNOSIS — K859 Acute pancreatitis without necrosis or infection, unspecified: Secondary | ICD-10-CM

## 2022-06-08 DIAGNOSIS — K750 Abscess of liver: Secondary | ICD-10-CM | POA: Diagnosis present

## 2022-06-08 DIAGNOSIS — Z87891 Personal history of nicotine dependence: Secondary | ICD-10-CM

## 2022-06-08 DIAGNOSIS — R55 Syncope and collapse: Secondary | ICD-10-CM | POA: Diagnosis not present

## 2022-06-08 DIAGNOSIS — Z9079 Acquired absence of other genital organ(s): Secondary | ICD-10-CM

## 2022-06-08 DIAGNOSIS — Z87442 Personal history of urinary calculi: Secondary | ICD-10-CM

## 2022-06-08 DIAGNOSIS — T402X5A Adverse effect of other opioids, initial encounter: Secondary | ICD-10-CM | POA: Diagnosis not present

## 2022-06-08 DIAGNOSIS — K8502 Idiopathic acute pancreatitis with infected necrosis: Secondary | ICD-10-CM | POA: Diagnosis not present

## 2022-06-08 DIAGNOSIS — A419 Sepsis, unspecified organism: Principal | ICD-10-CM | POA: Diagnosis present

## 2022-06-08 DIAGNOSIS — Z5329 Procedure and treatment not carried out because of patient's decision for other reasons: Secondary | ICD-10-CM | POA: Diagnosis present

## 2022-06-08 DIAGNOSIS — E872 Acidosis, unspecified: Secondary | ICD-10-CM | POA: Diagnosis present

## 2022-06-08 DIAGNOSIS — K922 Gastrointestinal hemorrhage, unspecified: Secondary | ICD-10-CM | POA: Diagnosis not present

## 2022-06-08 DIAGNOSIS — E119 Type 2 diabetes mellitus without complications: Secondary | ICD-10-CM | POA: Diagnosis present

## 2022-06-08 DIAGNOSIS — Z9841 Cataract extraction status, right eye: Secondary | ICD-10-CM

## 2022-06-08 DIAGNOSIS — R319 Hematuria, unspecified: Secondary | ICD-10-CM | POA: Insufficient documentation

## 2022-06-08 DIAGNOSIS — N170 Acute kidney failure with tubular necrosis: Secondary | ICD-10-CM | POA: Diagnosis present

## 2022-06-08 DIAGNOSIS — R34 Anuria and oliguria: Secondary | ICD-10-CM | POA: Diagnosis present

## 2022-06-08 DIAGNOSIS — Z7189 Other specified counseling: Secondary | ICD-10-CM | POA: Diagnosis not present

## 2022-06-08 DIAGNOSIS — D6489 Other specified anemias: Secondary | ICD-10-CM | POA: Diagnosis present

## 2022-06-08 DIAGNOSIS — Y92239 Unspecified place in hospital as the place of occurrence of the external cause: Secondary | ICD-10-CM | POA: Diagnosis not present

## 2022-06-08 DIAGNOSIS — Z79899 Other long term (current) drug therapy: Secondary | ICD-10-CM

## 2022-06-08 DIAGNOSIS — I10 Essential (primary) hypertension: Secondary | ICD-10-CM | POA: Diagnosis present

## 2022-06-08 DIAGNOSIS — N400 Enlarged prostate without lower urinary tract symptoms: Secondary | ICD-10-CM | POA: Diagnosis not present

## 2022-06-08 DIAGNOSIS — Z8546 Personal history of malignant neoplasm of prostate: Secondary | ICD-10-CM | POA: Diagnosis not present

## 2022-06-08 DIAGNOSIS — R945 Abnormal results of liver function studies: Secondary | ICD-10-CM | POA: Diagnosis not present

## 2022-06-08 DIAGNOSIS — Z7982 Long term (current) use of aspirin: Secondary | ICD-10-CM

## 2022-06-08 DIAGNOSIS — M549 Dorsalgia, unspecified: Secondary | ICD-10-CM | POA: Diagnosis present

## 2022-06-08 DIAGNOSIS — D696 Thrombocytopenia, unspecified: Secondary | ICD-10-CM | POA: Diagnosis not present

## 2022-06-08 DIAGNOSIS — N1411 Contrast-induced nephropathy: Secondary | ICD-10-CM | POA: Diagnosis present

## 2022-06-08 LAB — CBC WITH DIFFERENTIAL/PLATELET
Abs Immature Granulocytes: 0.11 10*3/uL — ABNORMAL HIGH (ref 0.00–0.07)
Basophils Absolute: 0 10*3/uL (ref 0.0–0.1)
Basophils Relative: 0 %
Eosinophils Absolute: 0 10*3/uL (ref 0.0–0.5)
Eosinophils Relative: 0 %
HCT: 50.2 % (ref 39.0–52.0)
Hemoglobin: 15.9 g/dL (ref 13.0–17.0)
Immature Granulocytes: 1 %
Lymphocytes Relative: 15 %
Lymphs Abs: 3 10*3/uL (ref 0.7–4.0)
MCH: 27.1 pg (ref 26.0–34.0)
MCHC: 31.7 g/dL (ref 30.0–36.0)
MCV: 85.5 fL (ref 80.0–100.0)
Monocytes Absolute: 1 10*3/uL (ref 0.1–1.0)
Monocytes Relative: 5 %
Neutro Abs: 15.6 10*3/uL — ABNORMAL HIGH (ref 1.7–7.7)
Neutrophils Relative %: 79 %
Platelets: 222 10*3/uL (ref 150–400)
RBC: 5.87 MIL/uL — ABNORMAL HIGH (ref 4.22–5.81)
RDW: 16.9 % — ABNORMAL HIGH (ref 11.5–15.5)
WBC: 19.8 10*3/uL — ABNORMAL HIGH (ref 4.0–10.5)
nRBC: 0 % (ref 0.0–0.2)

## 2022-06-08 LAB — HEPATIC FUNCTION PANEL
ALT: 530 U/L — ABNORMAL HIGH (ref 0–44)
AST: 1756 U/L — ABNORMAL HIGH (ref 15–41)
Albumin: 3.6 g/dL (ref 3.5–5.0)
Alkaline Phosphatase: 54 U/L (ref 38–126)
Bilirubin, Direct: 0.6 mg/dL — ABNORMAL HIGH (ref 0.0–0.2)
Indirect Bilirubin: 1.3 mg/dL — ABNORMAL HIGH (ref 0.3–0.9)
Total Bilirubin: 1.9 mg/dL — ABNORMAL HIGH (ref 0.3–1.2)
Total Protein: 6.8 g/dL (ref 6.5–8.1)

## 2022-06-08 LAB — URINALYSIS, ROUTINE W REFLEX MICROSCOPIC
Bilirubin Urine: NEGATIVE
Glucose, UA: 50 mg/dL — AB
Ketones, ur: NEGATIVE mg/dL
Leukocytes,Ua: NEGATIVE
Nitrite: NEGATIVE
Protein, ur: 100 mg/dL — AB
Specific Gravity, Urine: 1.015 (ref 1.005–1.030)
pH: 6 (ref 5.0–8.0)

## 2022-06-08 LAB — BASIC METABOLIC PANEL
Anion gap: 16 — ABNORMAL HIGH (ref 5–15)
BUN: 22 mg/dL (ref 8–23)
CO2: 25 mmol/L (ref 22–32)
Calcium: 9.6 mg/dL (ref 8.9–10.3)
Chloride: 96 mmol/L — ABNORMAL LOW (ref 98–111)
Creatinine, Ser: 1.69 mg/dL — ABNORMAL HIGH (ref 0.61–1.24)
GFR, Estimated: 41 mL/min — ABNORMAL LOW (ref >60)
Glucose, Bld: 160 mg/dL — ABNORMAL HIGH (ref 70–99)
Potassium: UNDETERMINED mmol/L (ref 3.5–5.1)
Sodium: 137 mmol/L (ref 135–145)

## 2022-06-08 LAB — LACTIC ACID, PLASMA
Lactic Acid, Venous: 5.6 mmol/L (ref 0.5–1.9)
Lactic Acid, Venous: 5.5 mmol/L (ref 0.5–1.9)

## 2022-06-08 LAB — ACETAMINOPHEN LEVEL: Acetaminophen (Tylenol), Serum: <10 ug/mL — ABNORMAL LOW (ref 10–30)

## 2022-06-08 LAB — POTASSIUM: Potassium: 3.4 mmol/L — ABNORMAL LOW (ref 3.5–5.1)

## 2022-06-08 LAB — LIPASE, BLOOD: Lipase: 1633 U/L — ABNORMAL HIGH (ref 11–51)

## 2022-06-08 LAB — PROTIME-INR
INR: 1.1 (ref 0.8–1.2)
Prothrombin Time: 14.5 seconds (ref 11.4–15.2)

## 2022-06-08 LAB — TROPONIN I (HIGH SENSITIVITY)
Troponin I (High Sensitivity): 15 ng/L (ref <18)
Troponin I (High Sensitivity): 16 ng/L (ref <18)

## 2022-06-08 IMAGING — DX DG CHEST 1V PORT
1 series · 1 of 1 positions shown · non-contrast
Comparison: [DATE].

CLINICAL DATA: Weakness.  Hypotension.

EXAM:
PORTABLE CHEST 1 VIEW

[chest ap]
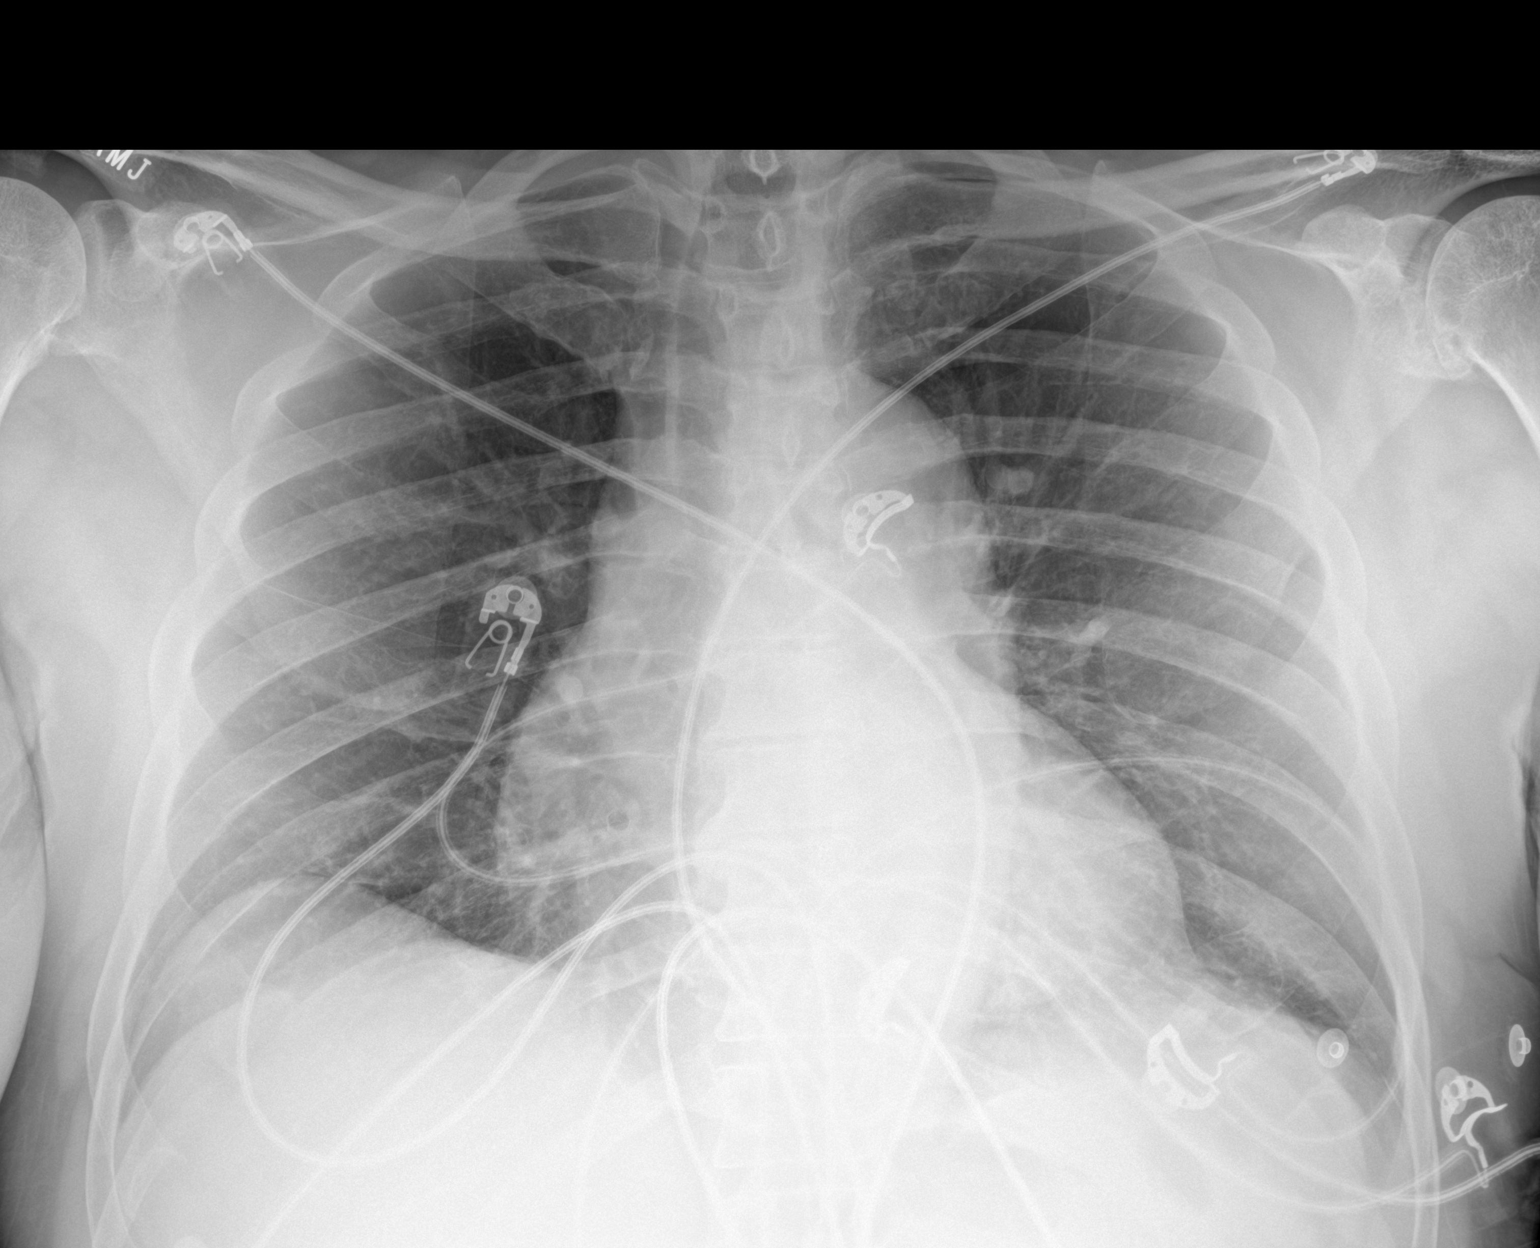

[1 of 1 positions shown; findings below may reference images not displayed]

FINDINGS: EKG leads project over the chest.

Cardiomediastinal contours and hilar structures are stable
accounting for AP projection and portable technique.

No lobar consolidation.  No gross effusion on frontal radiograph.

Linear areas of perihilar airspace disease in the RIGHT chest.

On limited assessment there is no acute skeletal process.
IMPRESSION: Linear areas of perihilar airspace disease in the RIGHT chest,
likely atelectasis.

## 2022-06-08 IMAGING — US US ABDOMEN LIMITED
1 series · 15 of 25 positions shown · non-contrast
Comparison: None Available.

CLINICAL DATA: Right upper quadrant abdominal pain

EXAM:
ULTRASOUND ABDOMEN LIMITED RIGHT UPPER QUADRANT

[Series 1: us abdomen limited ruq · 15 of 110 slices shown]
[im 1/110]
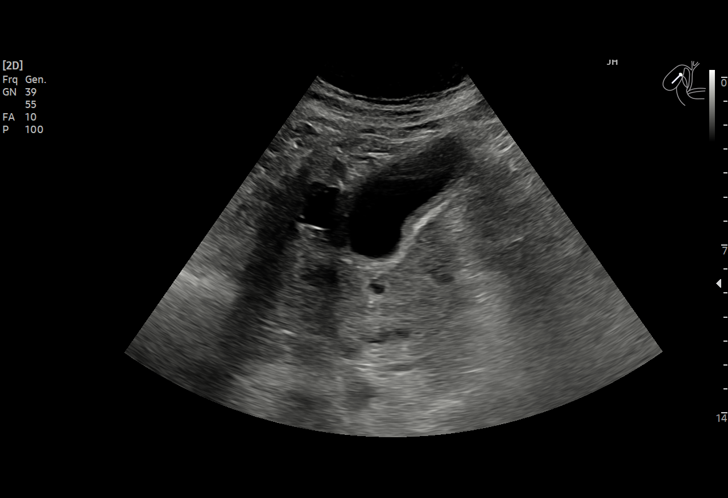
[im 10/110]
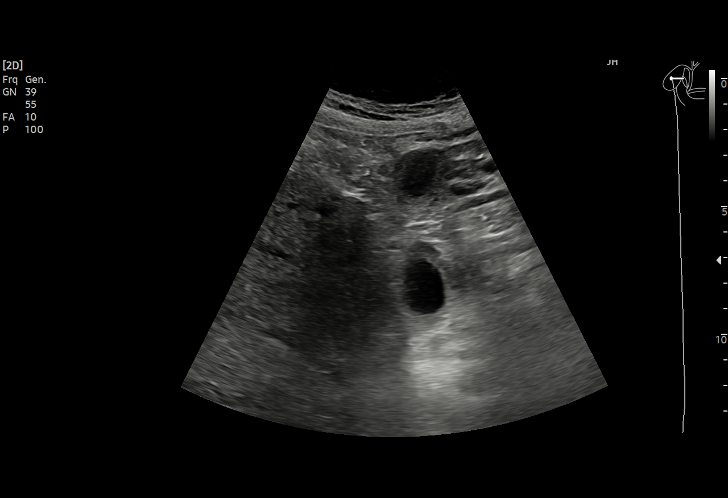
[im 19/110]
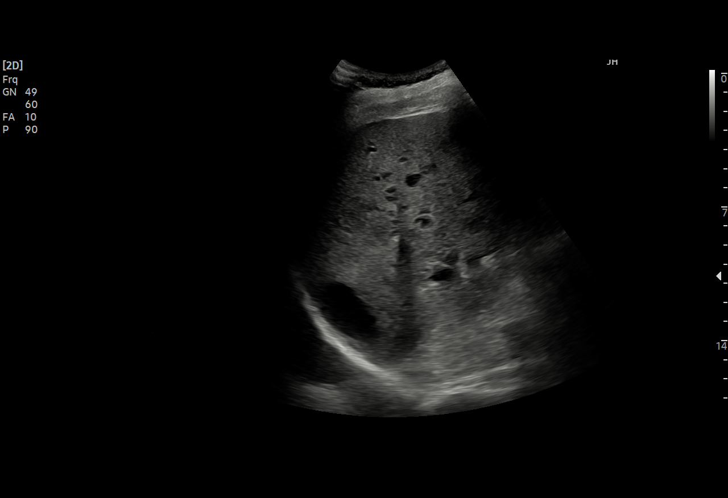
[im 23/110]
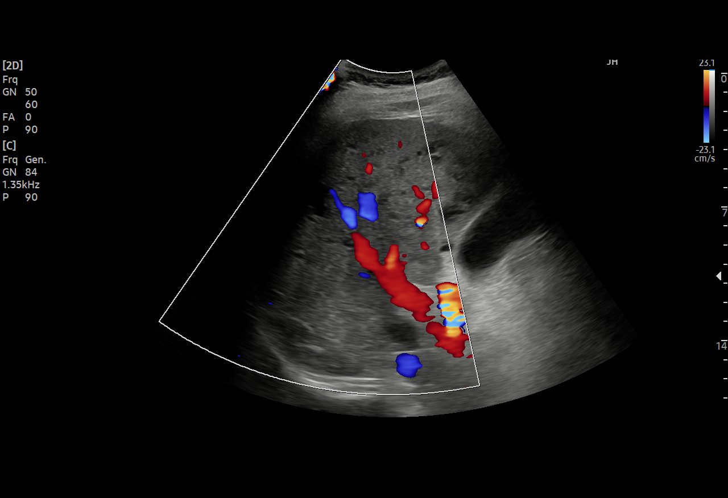
[im 32/110]
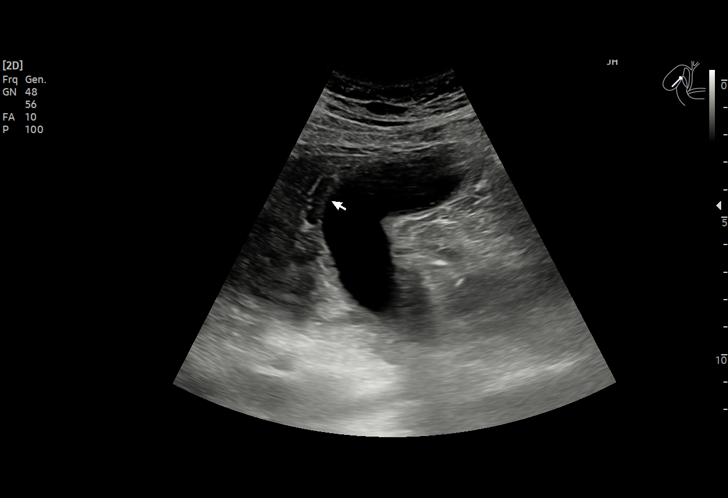
[im 41/110]
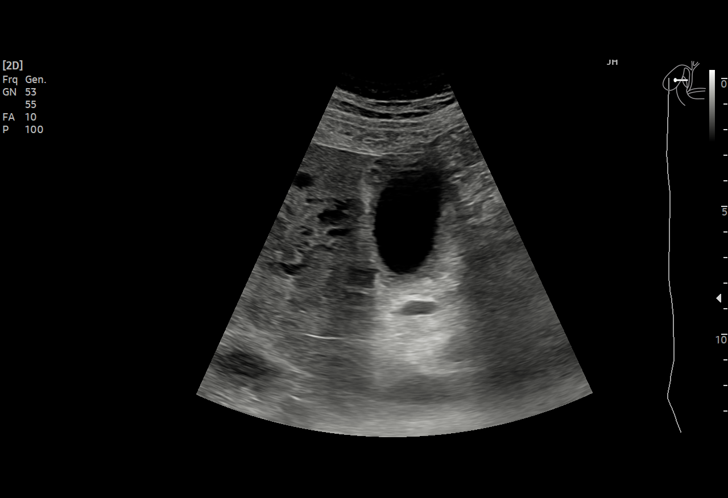
[im 46/110]
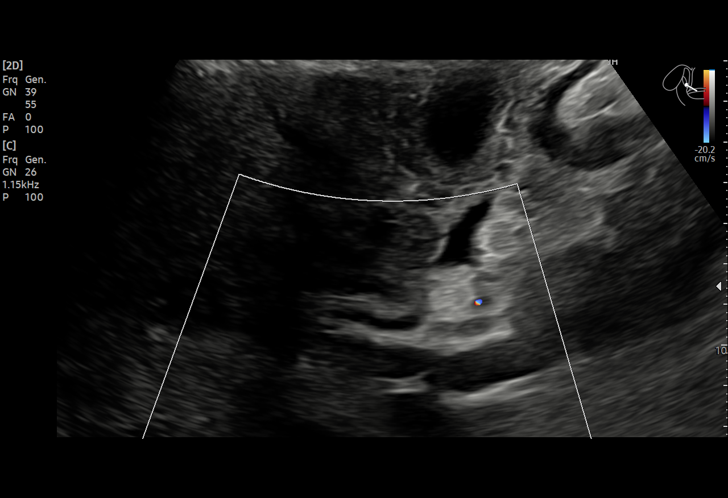
[im 55/110]
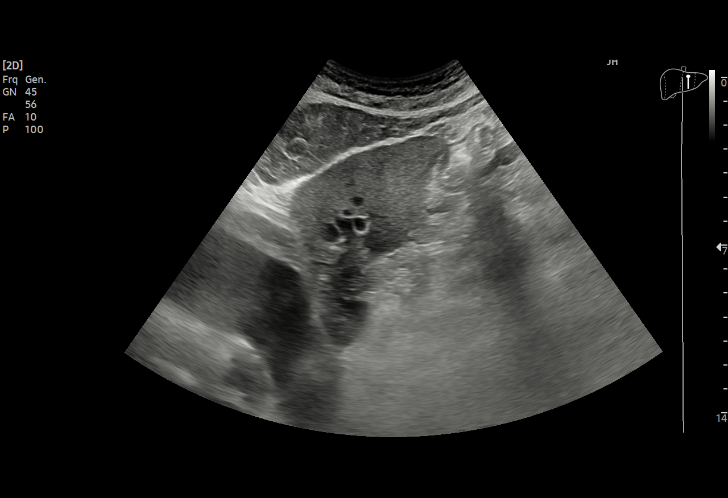
[im 64/110]
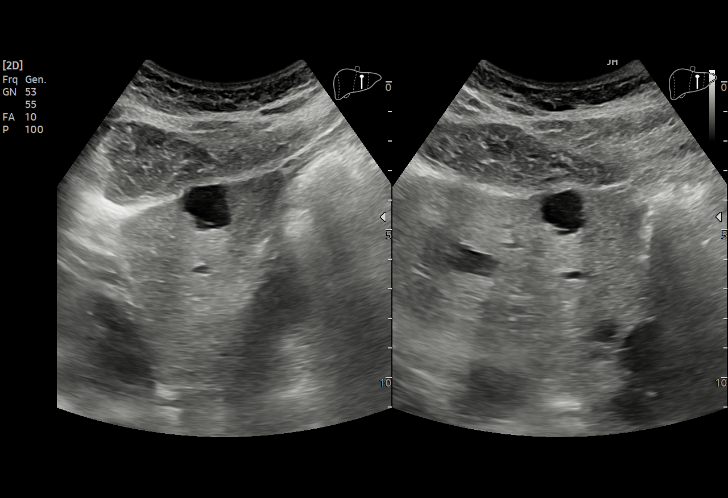
[im 69/110]
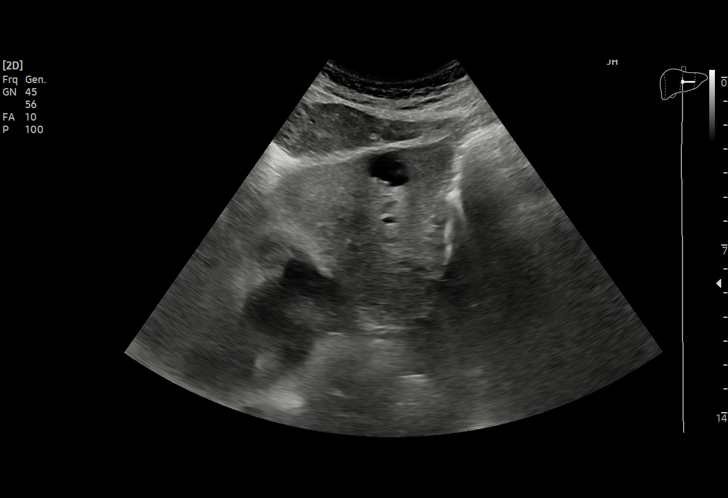
[im 78/110]
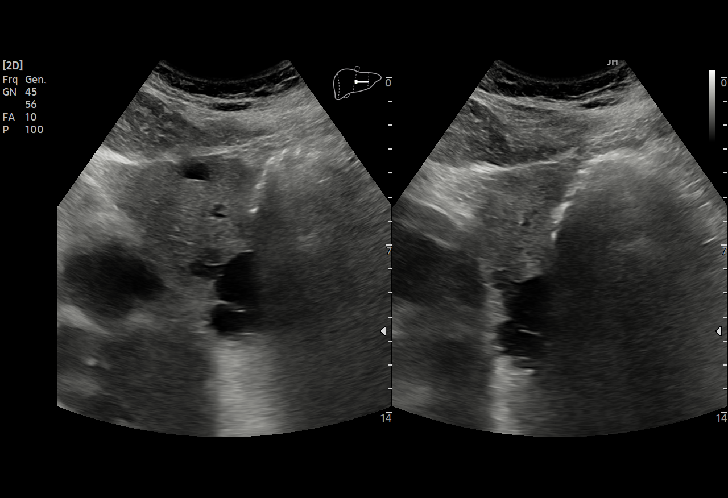
[im 87/110]
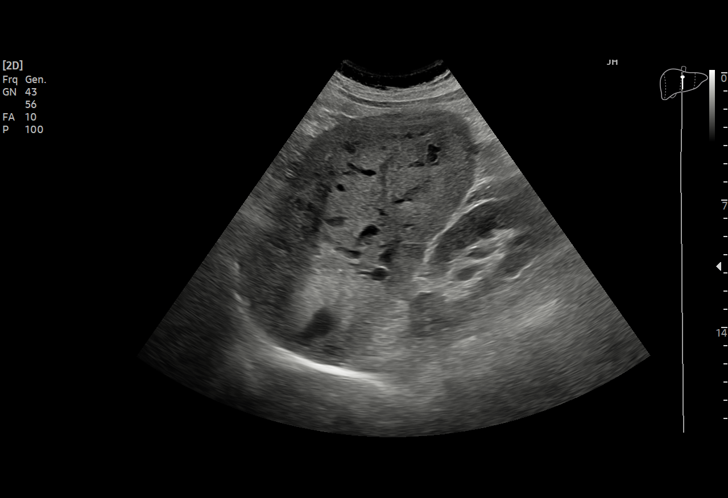
[im 91/110]
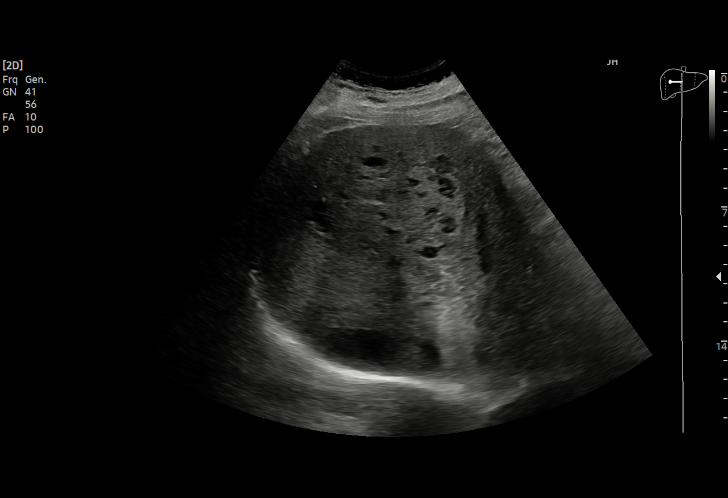
[im 100/110]
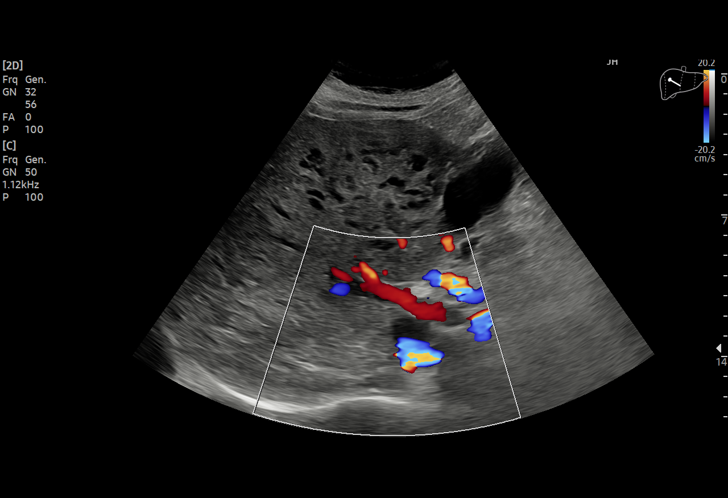
[im 110/110]
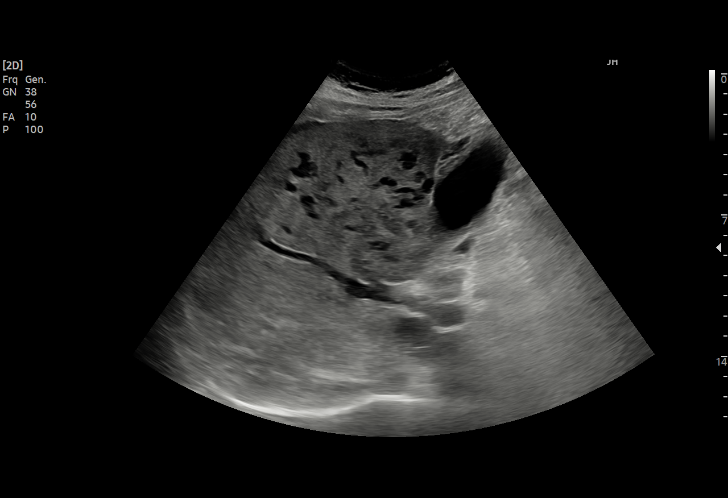

[15 of 25 positions shown; findings below may reference images not displayed]

FINDINGS: Gallbladder:

The gallbladder wall is mildly thickened and there is trace
pericholecystic fluid, nonspecific. This can be seen in the setting
of acute calculus cholecystitis or inflammatory conditions of the
biliary tree, as can be seen with cholangitis or pancreatitis. The
gallbladder is not distended. No intraluminal stones or sludge is
identified. The sonographic Murphy sign is reportedly negative.

Common bile duct:

Diameter: 2 mm in proximal diameter

Liver:

There is intrahepatic biliary ductal dilation noted within the right
hepatic lobe and within the biliary hilum. There is superimposed
heterogeneity of the hepatic echotexture in this region, best
appreciated on # 92-95 and and ill-defined mixed solid and cystic
mass is difficult to exclude. Multiple additional simple cysts are
identified within the left hepatic lobe. Portal vein is patent on
color Doppler imaging with normal direction of blood flow towards
the liver.

Other: No ascites
IMPRESSION: 1. Mild gallbladder wall thickening with trace pericholecystic
fluid, nonspecific. No evidence for cholelithiasis or gallbladder
sludge.
2. Intrahepatic biliary ductal dilation within the right hepatic
lobe and biliary hilum. There is superimposed heterogeneity of the
hepatic echotexture in this region and ill-defined mixed solid and
cystic mass is difficult to exclude. Further evaluation with
contrast-enhanced MRI is recommended.
3. Multiple additional simple cysts within the left hepatic lobe.

## 2022-06-08 IMAGING — CT CT ABD-PELV W/ CM
2 of 5 series · 15 of 46 positions shown, 17 images · IV contrast (APPLIED)
Comparison: None Available.

CLINICAL DATA: Hematuria.  Hypotension.  Syncopal episode.

EXAM:
CT ABDOMEN AND PELVIS WITH CONTRAST
TECHNIQUE: Multidetector CT imaging of the abdomen and pelvis was performed
using the standard protocol following bolus administration of
intravenous contrast.

[Series 2: abdomen 5.0 · axial · 0.90mm/px · z∈[-1339,-879]mm · 12 of 110 slices shown, 14 images]
[im 9/110  soft-tissue]
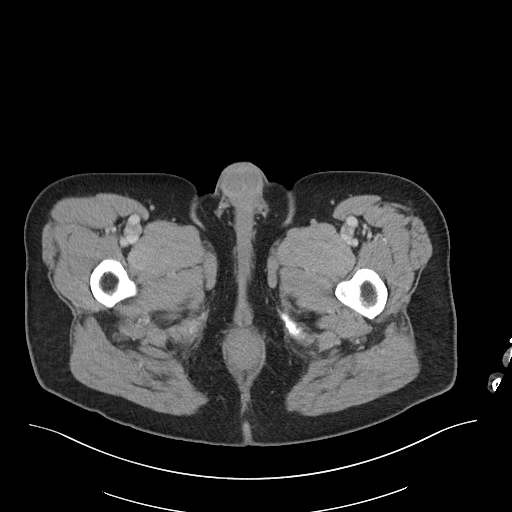
[im 9/110  bone]
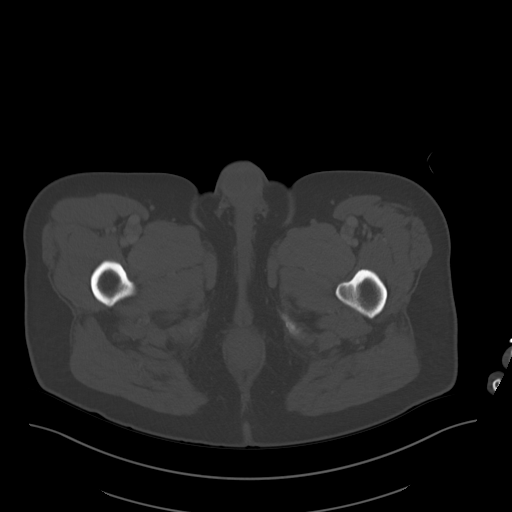
[im 17/110  soft-tissue]
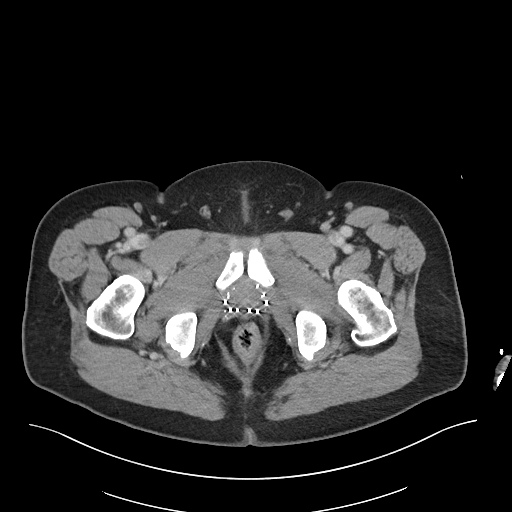
[im 26/110  soft-tissue]
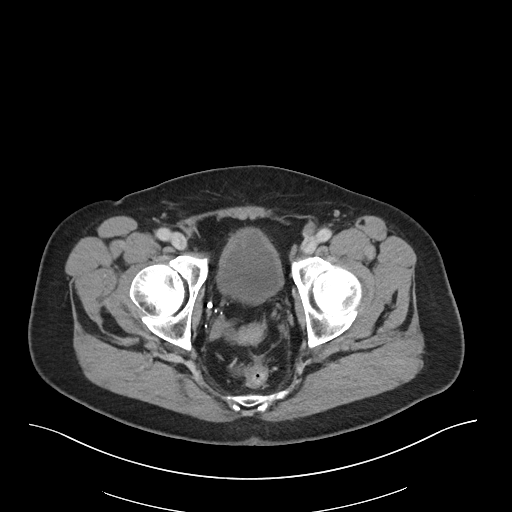
[im 34/110  soft-tissue]
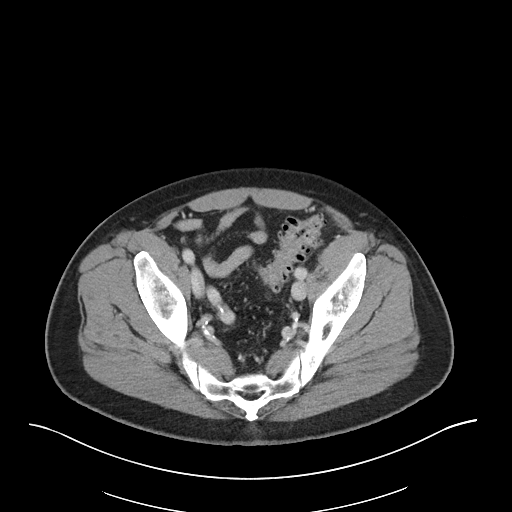
[im 42/110  soft-tissue]
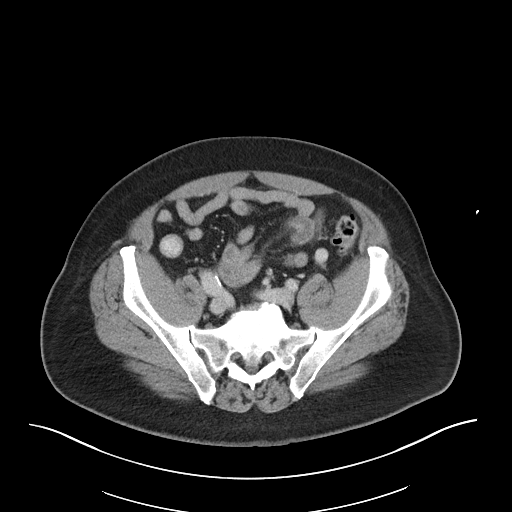
[im 51/110  soft-tissue]
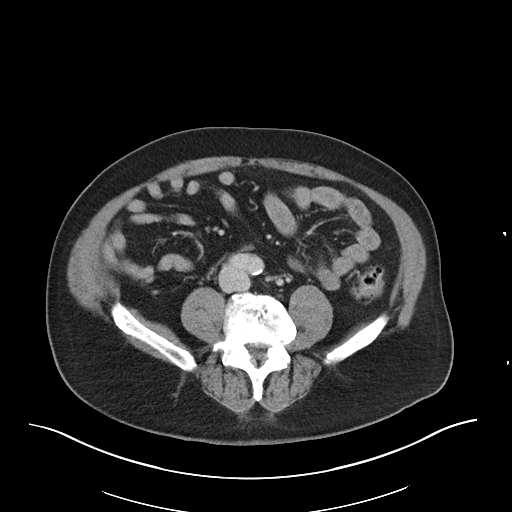
[im 59/110  soft-tissue]
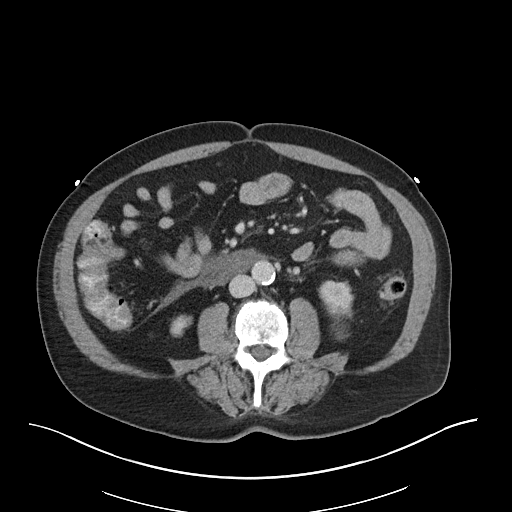
[im 68/110  soft-tissue]
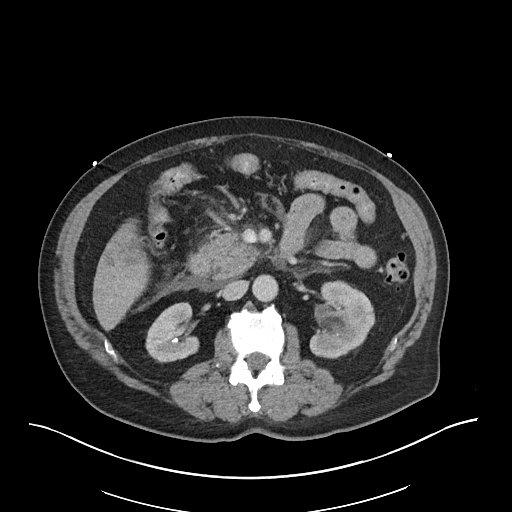
[im 76/110  soft-tissue]
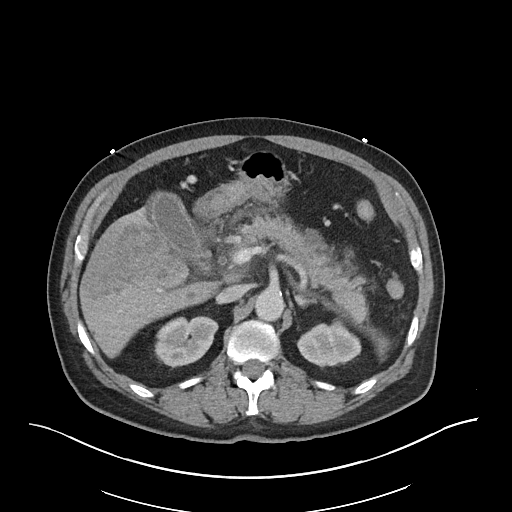
[im 76/110  bone]
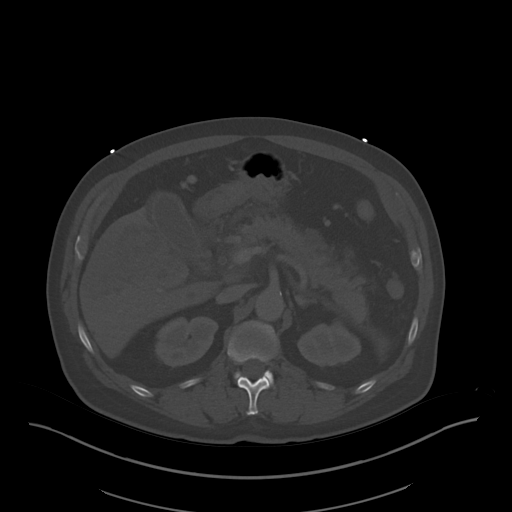
[im 84/110  soft-tissue]
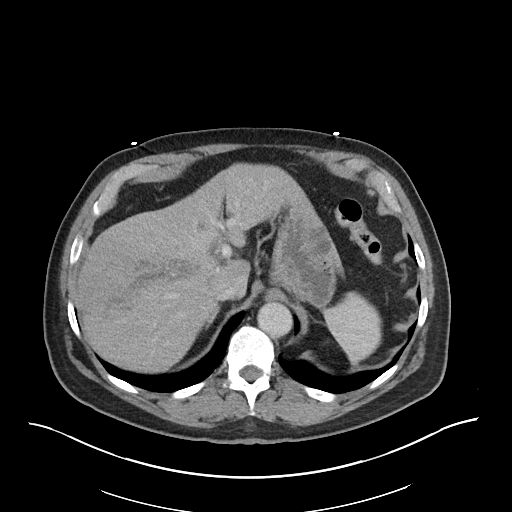
[im 93/110  soft-tissue]
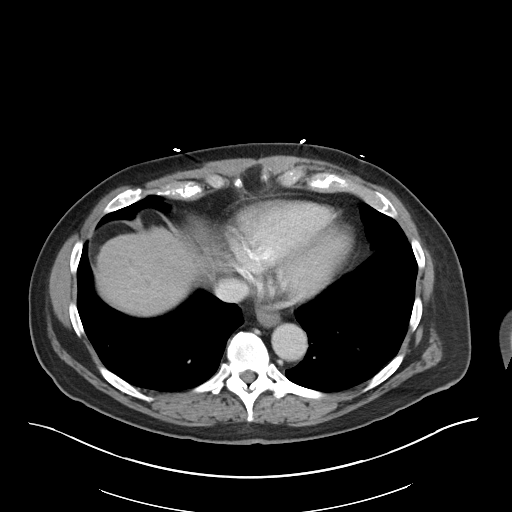
[im 101/110  soft-tissue]
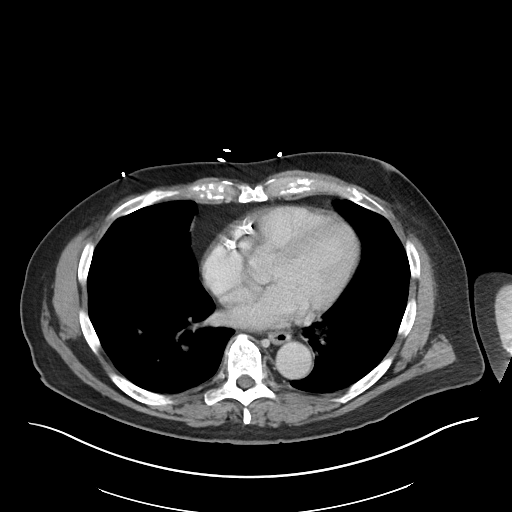

[Series 5: abdomen 3.0 mpr cor · coronal · 0.86mm/px · 3 of 94 slices shown]
[im 32/94  soft-tissue]
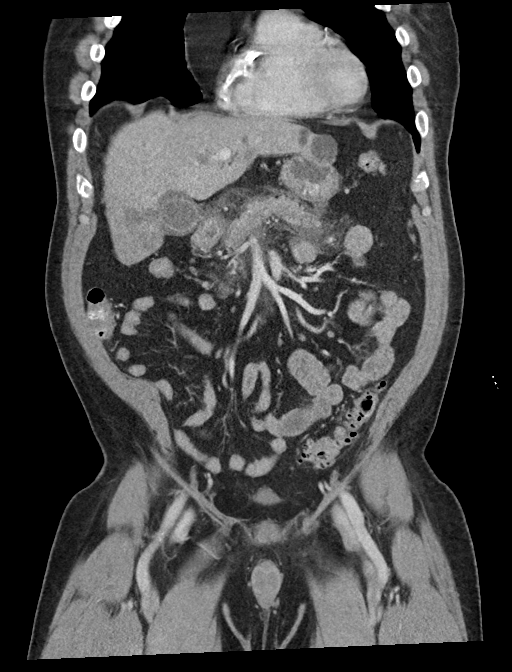
[im 42/94  soft-tissue]
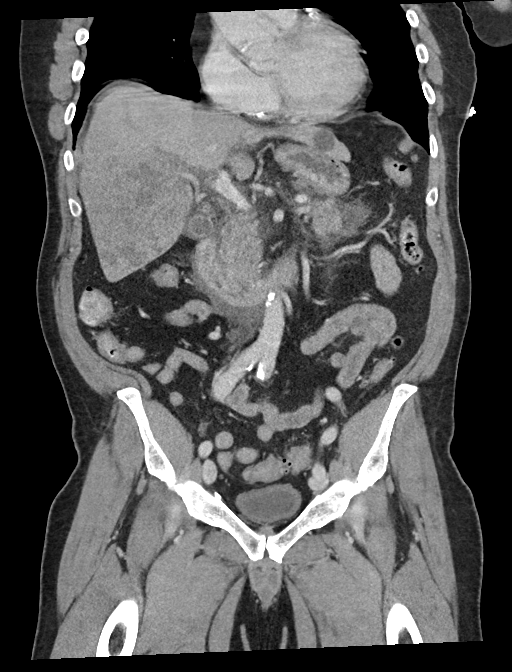
[im 52/94  soft-tissue]
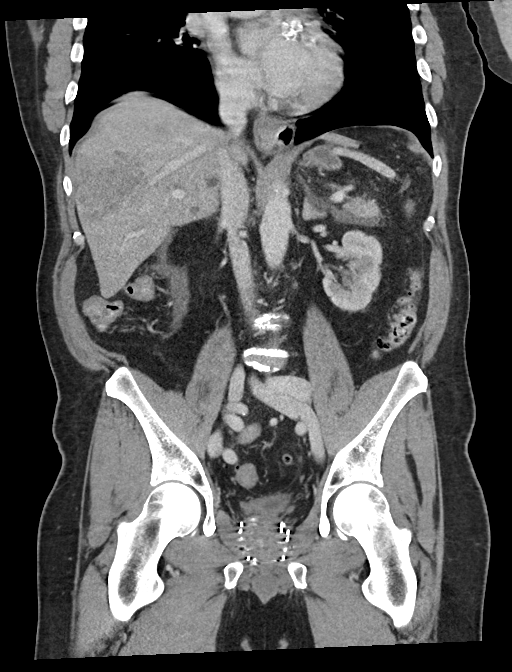

[15 of 46 positions shown; findings below may reference images not displayed]

RADIATION DOSE REDUCTION: This exam was performed according to the
departmental dose-optimization program which includes automated
exposure control, adjustment of the mA and/or kV according to
patient size and/or use of iterative reconstruction technique.

CONTRAST:  80mL OMNIPAQUE IOHEXOL 300 MG/ML  SOLN
FINDINGS: Lower Chest: No acute findings.

Hepatobiliary: Multiple small benign-appearing hepatic cysts are
noted. An ill-defined heterogeneous low-attenuation lesion is seen
in the anterior segment the right hepatic lobe which measures
approximately 9.1 x 6.4 cm on image 34/2. Portal vein thrombosis is
also seen in the anterior segment of the right hepatic lobe adjacent
to this lesion. No other suspicious hepatic masses are identified.

Mild diffuse gallbladder wall thickening is seen, which could be
secondary to acute pancreatitis although acute cholecystitis cannot
definitely be excluded.

Pancreas: Moderate acute pancreatitis is seen with diffuse
peripancreatic inflammatory changes and small amount of fluid
extending inferiorly in the right anterior pararenal space. No
evidence of pancreatic necrosis or pseudocysts. No evidence of
pancreatic mass.

Spleen: Within normal limits in size and appearance.

Adrenals/Urinary Tract: 2 small less than 1 cm right renal calculi
are seen. No evidence of ureteral calculi or hydronephrosis. 5 cm
benign-appearing left renal cyst noted (no followup imaging
recommended). No masses identified.

Stomach/Bowel: Small hiatal hernia noted. No evidence of
obstruction, inflammatory process or abnormal fluid collections.
Normal appendix visualized. Diverticulosis is seen mainly involving
the descending and sigmoid colon, however there is no evidence of
diverticulitis.

Vascular/Lymphatic: No pathologically enlarged lymph nodes. No acute
vascular findings. Aortic atherosclerotic calcification incidentally
noted.

Reproductive: Brachytherapy seeds noted throughout the prostate
gland. Otherwise unremarkable.

Other:  None.

Musculoskeletal:  No suspicious bone lesions identified.
IMPRESSION: Moderate acute pancreatitis. No evidence of pancreatic necrosis or
pseudocysts.

Mild diffuse gallbladder wall thickening likely secondary to acute
pancreatitis, although acute cholecystitis cannot definitely be
excluded.

9.1 cm ill-defined heterogeneous mass lesion in anterior segment of
right hepatic lobe, with thrombosis of the anterior right portal
vein. Differential diagnosis includes neoplasm and abscess. Consider
abdomen MRI without and with contrast for further characterization.

Right nephrolithiasis. No evidence of ureteral calculi or
hydronephrosis.

Small hiatal hernia.

Colonic diverticulosis, without radiographic evidence of
diverticulitis.

Aortic Atherosclerosis ([N6]-[N6]).

## 2022-06-08 IMAGING — MR MR ABDOMEN WO/W CM
19 of 20 series · 44 of 48 positions shown · IV contrast (10ml Gadavist)
Comparison: Comparison made with CT evaluation [DATE].

CLINICAL DATA: Abnormalities on recent CT evaluation.

EXAM:
MRI ABDOMEN WITHOUT AND WITH CONTRAST
TECHNIQUE: Multiplanar multisequence MR imaging of the abdomen was performed
both before and after the administration of intravenous contrast.
CONTRAST:  10mL GADAVIST GADOBUTROL 1 MMOL/ML IV SOLN

[Series 3: T2 · coronal · 6.0mm · 1.19mm/px · 1 of 33 slices shown (1 of 2)]
[im 1/33]
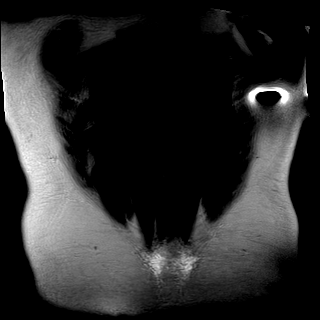

[Series 4: T2 · axial · 6.0mm · 1.19mm/px · 1 of 36 slices shown (2 of 2)]
[im 1/36]
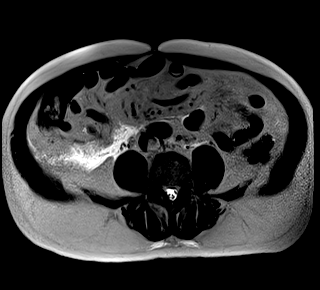

[Series 6: T2 fat-sat · axial · 6.0mm · 1.19mm/px · 1 of 38 slices shown]
[im 1/38]
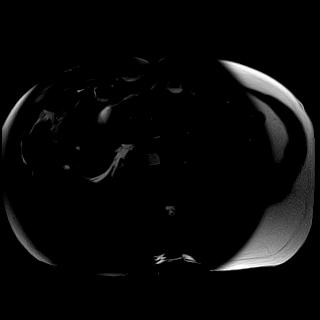

[Series 7: ax dwi_tracew · axial · 6.0mm · 1.42mm/px · z∈[-260,+6]mm · 3 of 114 slices shown]
[im 1/114]
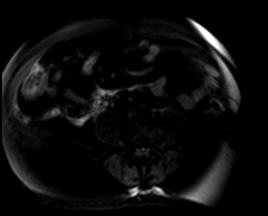
[im 57/114]
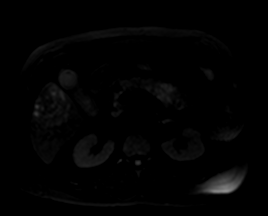
[im 114/114]
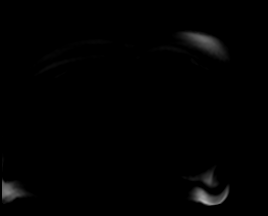

[Series 8: ax dwi_adc · axial · 6.0mm · 1.42mm/px · 1 of 38 slices shown]
[im 1/38]
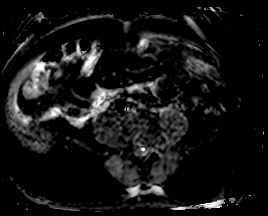

[Series 9: in & out · axial · 3.0mm · 1.19mm/px · z∈[-257,+3]mm · 2 of 88 slices shown (1 of 2)]
[im 1/88]
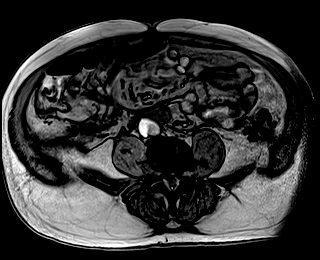
[im 88/88]
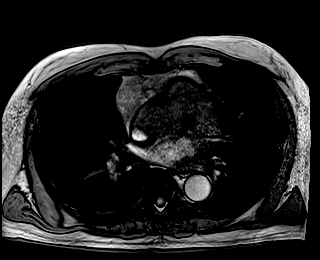

[Series 10: in & out · axial · 3.0mm · 1.19mm/px · z∈[-257,+3]mm · 3 of 88 slices shown (2 of 2)]
[im 1/88]
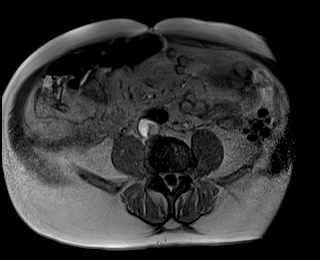
[im 44/88]
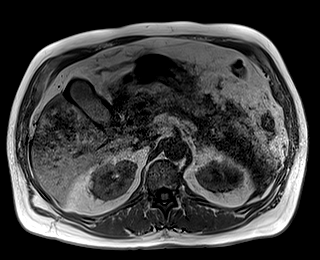
[im 88/88]
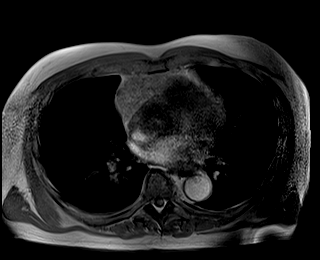

[Series 11: bSSFP · axial · 6.0mm · 0.74mm/px · 1 of 36 slices shown]
[im 1/36]
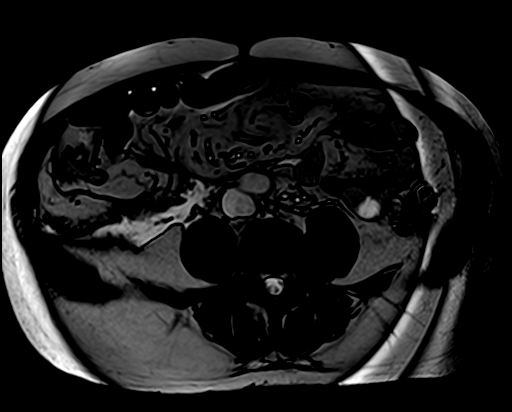

[Series 12: T1 dynamic fat-sat · axial · non-contrast · 3.0mm · 1.19mm/px · z∈[-257,+3]mm · 3 of 88 slices shown (1 of 5)]
[im 1/88]
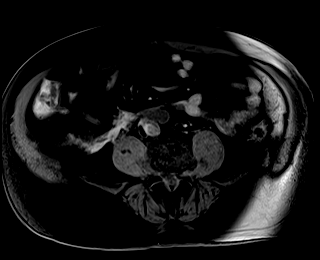
[im 44/88]
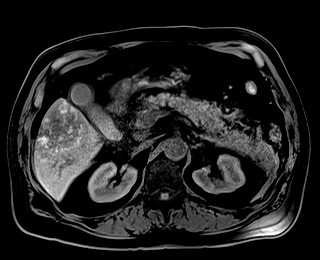
[im 88/88]
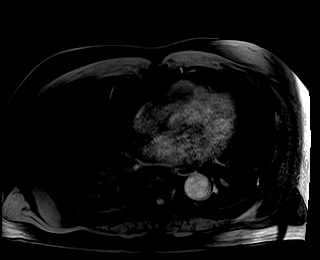

[Series 13: T1 dynamic fat-sat post-contrast · axial · 3.0mm · 1.19mm/px · z∈[-257,+3]mm · 3 of 88 slices shown (1 of 5)]
[im 1/88]
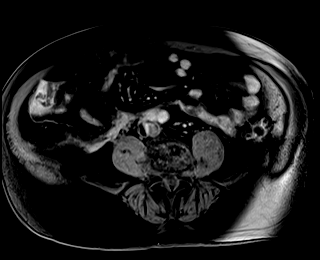
[im 44/88]
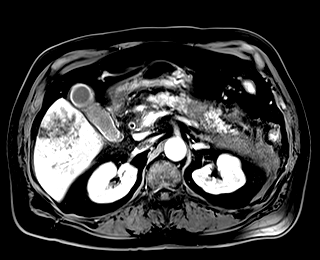
[im 88/88]
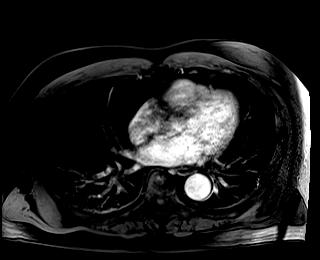

[Series 14: T1 dynamic fat-sat · axial · 3.0mm · 1.19mm/px · z∈[-257,+3]mm · 3 of 88 slices shown (2 of 5)]
[im 1/88]
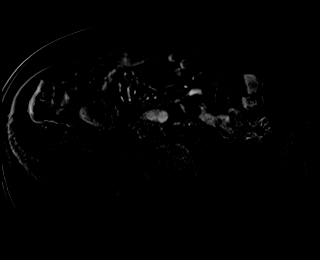
[im 44/88]
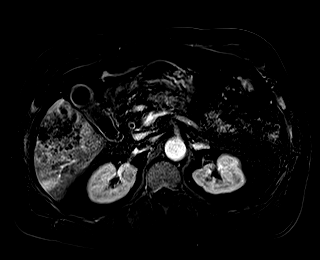
[im 88/88]
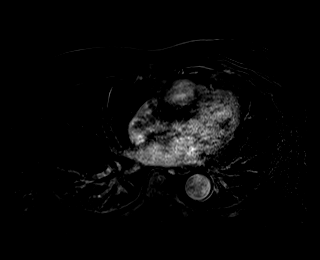

[Series 15: T1 dynamic fat-sat post-contrast · axial · 3.0mm · 1.19mm/px · z∈[-257,+3]mm · 3 of 88 slices shown (2 of 5)]
[im 1/88]
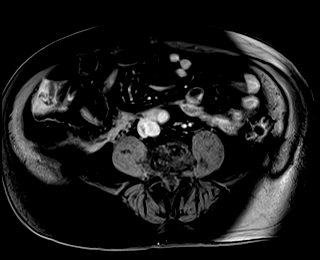
[im 44/88]
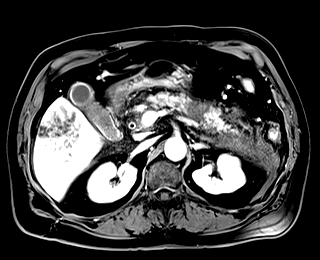
[im 88/88]
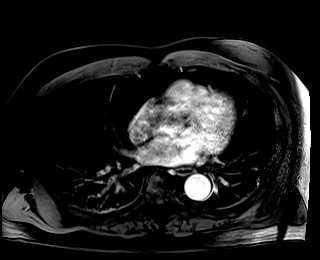

[Series 16: T1 dynamic fat-sat · axial · 3.0mm · 1.19mm/px · z∈[-257,+3]mm · 3 of 88 slices shown (3 of 5)]
[im 1/88]
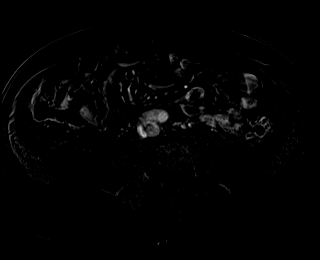
[im 44/88]
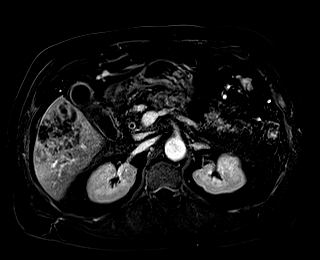
[im 88/88]
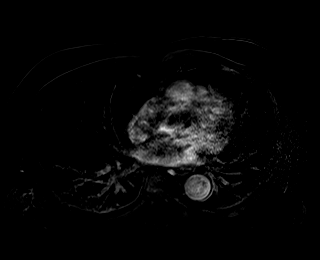

[Series 17: T1 dynamic fat-sat post-contrast · axial · 3.0mm · 1.19mm/px · z∈[-257,+3]mm · 3 of 88 slices shown (3 of 5)]
[im 1/88]
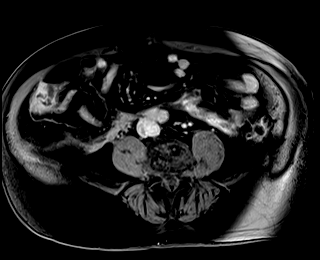
[im 44/88]
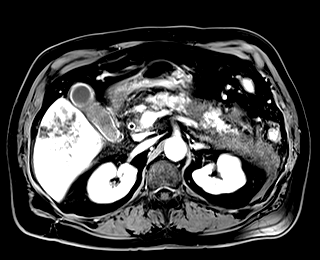
[im 88/88]
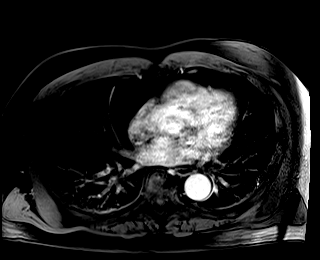

[Series 18: T1 dynamic fat-sat · axial · 3.0mm · 1.19mm/px · z∈[-257,+3]mm · 3 of 88 slices shown (4 of 5)]
[im 1/88]
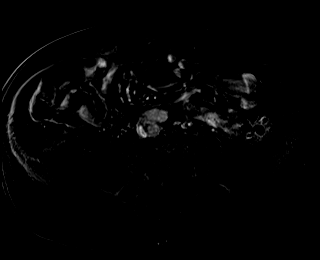
[im 44/88]
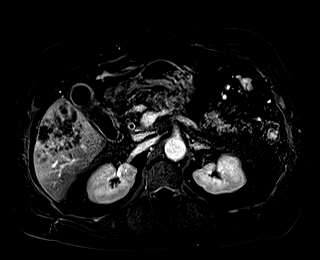
[im 88/88]
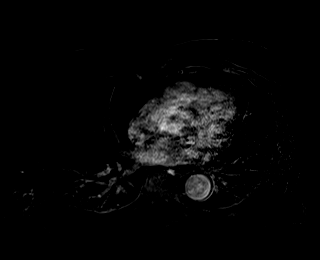

[Series 19: T1 dynamic post-contrast · coronal · 3.0mm · 1.31mm/px · 2 of 72 slices shown]
[im 1/72]
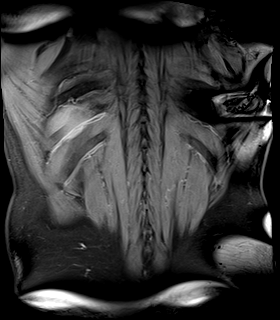
[im 72/72]
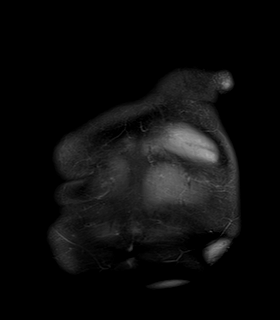

[Series 20: T1 dynamic fat-sat post-contrast · axial · 3.0mm · 1.19mm/px · z∈[-257,+3]mm · 3 of 88 slices shown (4 of 5)]
[im 1/88]
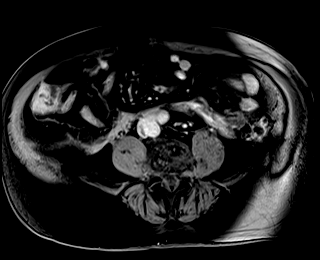
[im 44/88]
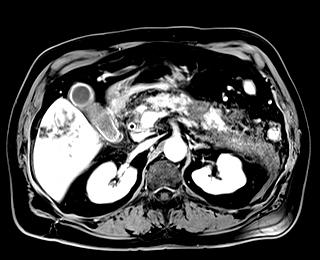
[im 88/88]
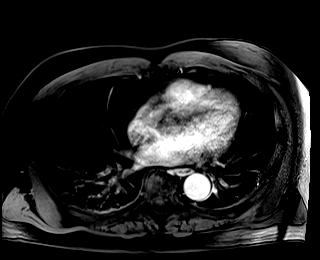

[Series 21: T1 dynamic fat-sat · axial · 3.0mm · 1.19mm/px · z∈[-257,+3]mm · 3 of 88 slices shown (5 of 5)]
[im 1/88]
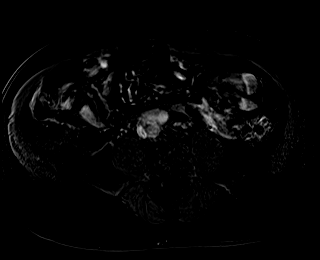
[im 44/88]
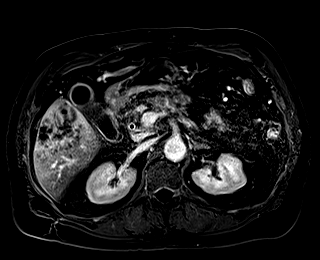
[im 88/88]
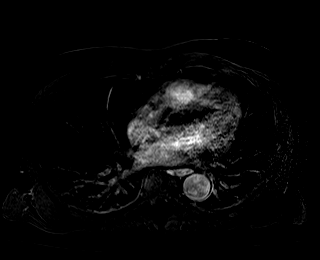

[Series 22: T1 dynamic fat-sat post-contrast · axial · 3.0mm · 1.19mm/px · z∈[-257,-129]mm · 2 of 88 slices shown (5 of 5)]
[im 1/88]
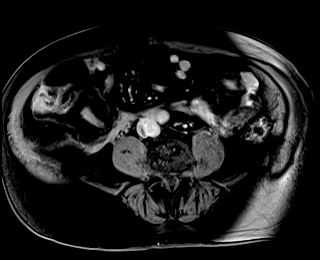
[im 44/88]
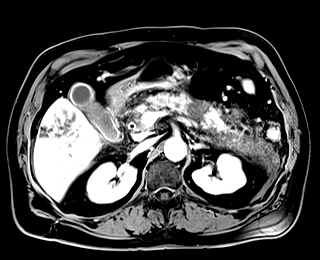

[44 of 48 positions shown; findings below may reference images not displayed]

FINDINGS: Lower chest: Incidental imaging of the lung bases without effusion
or consolidation.

Hepatobiliary: Multi cystic appearing area in the RIGHT hepatic lobe
within the distribution of portal venous thrombus showing variable
perfusion in this area measuring 9.5 x 6.4 cm (image 33/13) portal
venous thrombus to RIGHT superior and anterior portal venous
branches subtending hepatic subsegment VIII and V. there are areas
of intrinsic T1 hyperintensity that surround this lesion and are
interspersed throughout the lesion.

A second hypovascular lesion is demonstrated in the posterior LEFT
hemiliver, lateral segment (image 28/13) also with intrinsic T1
hyperintensity measuring 17 mm.

Perfusional changes in the area related to portal venous thrombus,
less conspicuous on delayed phase.

Hepatic cysts elsewhere in the liver.

Mild pericholecystic fluid and edema in the gallbladder wall with
mild enhancement of the gallbladder wall.

Mild intrahepatic biliary duct distension. Subtle area of T2
hypointensity along the distal common bile duct up to 2-3 mm is
nonspecific on thick section non MRCP sequences. Note that the study
was not performed as an MRCP. Signs of peribiliary enhancement in
the setting of acute pancreatitis, see below.

Pancreas: Extensive peripancreatic and interstitial pancreatic edema
compatible with pancreatitis.

Diffuse edema and developing fluid without focal characteristics
tracking throughout the anterior pararenal space and about the
peripancreatic fat and lesser sac. The amount of fluid and edema is
increased over the short interval since the CT which was performed 8
hours prior to this exam.

Edema blunts the intrinsic T1 signal that is seen in the pancreas.
An area of non enhancement is present in the tail of the pancreas.
This measures 2.8 x 2.4 cm (image 47/17) an additional area of
pancreatic necrosis present in the neck of the pancreas (image 52/17
measuring 2.4 cm in the axial plane. There is no sign of ductal
dilation.

Spleen: Splenic vein is patent. Spleen is normal size without focal
lesion. Edema tracks towards the spleen in the LEFT upper quadrant,
increased since previous imaging as discussed.

Adrenals/Urinary Tract:  Adrenal glands are normal.

Bosniak category I cyst in the lower pole the LEFT kidney measuring
4.7 cm for which no follow-up is recommended in the absence of
localizing symptoms.

No suspicious renal lesion or hydronephrosis.

Stomach/Bowel: Edema of the gastric antrum and duodenum in the
setting of acute necrotic and edematous pancreatitis

Vascular/Lymphatic: Portal venous thrombus of anterior RIGHT hepatic
portal venous branches. Main portal vein is patent as are LEFT
portal branches. SMV is patent and splenic vein is patent. Normal
caliber of the abdominal aorta. No adenopathy in the abdomen.

Other: Small volume ascites. Anterior pararenal fluid and edema
measuring up to 9.8 x 2.8 cm on the RIGHT is currently nonfocal and
is increased in volume also along the LEFT hemiabdomen tracking
towards the spleen and throughout the anterior pararenal space.

Musculoskeletal: No suspicious bone lesions identified.
IMPRESSION: 1. Multi cystic area in the RIGHT hepatic lobe following a vascular
distribution, affected by portal venous thrombus, in isolation is
most suspicious for developing micro abscesses in the setting of
poor vascular flow related to portal thrombus and acute necrotic
pancreatitis. There is however intrinsic T1 hyperintensity which can
be seen in the setting of hemorrhage, mucin or ENAIDE (melanoma,
felt unlikely based on distribution and associated findings at this
time). Alternative neoplastic considerations remain in the
differential due to a second smaller lesion in the LEFT hepatic lobe
which is away from the affected vascular distribution, also with
intrinsic T1 hyperintensity. For this reason sampling of the area in
the RIGHT hepatic lobe may be helpful to exclude underlying
malignancy and obtain a sample for microbiologic assessment.
Ultimately, 8-12 week follow-up after therapy even if biopsy is
negative is suggested to ensure resolution.
2. Acute necrotic and edematous pancreatitis with worsening since
previous imaging. Focal areas of necrosis can lead to ductal
interruption and for this reason close follow-up is suggested.
3. Mild intrahepatic biliary duct distension. Subtle area of T2
hypointensity along the distal common bile duct cannot be confirmed
as intraductal stone and may simply represent volume averaging but
does raise the question of intraductal calculus. Dedicated MRCP may
be helpful in the setting of worsening pancreatitis.
4. Gallbladder edema with under distension favored to be secondary
to severe acute pancreatitis, attention on follow-up. Could consider
HIDA scan as warranted if there is continued concern for concomitant
cholecystitis.
5. Increase in anterior pararenal fluid since previous imaging
related to worsening of pancreatitis.

These results will be called to the ordering clinician or
representative by the Radiologist Assistant, and communication
documented in the PACS or [REDACTED].

## 2022-06-08 MED ORDER — LACTATED RINGERS IV SOLN: INTRAVENOUS | Status: AC

## 2022-06-08 MED ORDER — HEPARIN BOLUS VIA INFUSION
6300.0000 [IU] | Freq: Once | INTRAVENOUS | Status: AC
  Administered 2022-06-09: 6300 [IU] via INTRAVENOUS
  Filled 2022-06-08: qty 6300

## 2022-06-08 MED ORDER — INSULIN ASPART 100 UNIT/ML IJ SOLN
0.0000 [IU] | INTRAMUSCULAR | Status: DC
  Administered 2022-06-09 – 2022-06-10 (×6): 2 [IU] via SUBCUTANEOUS
  Administered 2022-06-11: 3 [IU] via SUBCUTANEOUS
  Administered 2022-06-11 (×2): 2 [IU] via SUBCUTANEOUS
  Administered 2022-06-11: 3 [IU] via SUBCUTANEOUS
  Administered 2022-06-11: 2 [IU] via SUBCUTANEOUS
  Administered 2022-06-12: 3 [IU] via SUBCUTANEOUS
  Administered 2022-06-12: 5 [IU] via SUBCUTANEOUS
  Administered 2022-06-12 (×2): 3 [IU] via SUBCUTANEOUS
  Administered 2022-06-12: 2 [IU] via SUBCUTANEOUS
  Administered 2022-06-12 – 2022-06-14 (×8): 3 [IU] via SUBCUTANEOUS
  Administered 2022-06-14: 2 [IU] via SUBCUTANEOUS
  Administered 2022-06-14 (×3): 3 [IU] via SUBCUTANEOUS
  Administered 2022-06-15 (×4): 2 [IU] via SUBCUTANEOUS
  Administered 2022-06-15 (×3): 3 [IU] via SUBCUTANEOUS
  Administered 2022-06-16 (×2): 2 [IU] via SUBCUTANEOUS
  Administered 2022-06-16: 3 [IU] via SUBCUTANEOUS
  Administered 2022-06-16: 2 [IU] via SUBCUTANEOUS
  Administered 2022-06-16: 3 [IU] via SUBCUTANEOUS
  Administered 2022-06-17: 2 [IU] via SUBCUTANEOUS
  Administered 2022-06-17: 3 [IU] via SUBCUTANEOUS
  Administered 2022-06-17: 2 [IU] via SUBCUTANEOUS
  Administered 2022-06-17 – 2022-06-18 (×8): 3 [IU] via SUBCUTANEOUS
  Administered 2022-06-19 (×3): 5 [IU] via SUBCUTANEOUS
  Administered 2022-06-19 – 2022-06-20 (×3): 3 [IU] via SUBCUTANEOUS
  Administered 2022-06-20 (×2): 5 [IU] via SUBCUTANEOUS
  Administered 2022-06-20 (×3): 3 [IU] via SUBCUTANEOUS
  Filled 2022-06-08 (×54): qty 1

## 2022-06-08 MED ORDER — HYDROMORPHONE HCL 1 MG/ML IJ SOLN
1.0000 mg | Freq: Once | INTRAMUSCULAR | Status: AC
  Administered 2022-06-08: 1 mg via INTRAVENOUS
  Filled 2022-06-08: qty 1

## 2022-06-08 MED ORDER — HYDROMORPHONE HCL 1 MG/ML IJ SOLN
0.5000 mg | Freq: Once | INTRAMUSCULAR | Status: AC
  Administered 2022-06-08: 0.5 mg via INTRAVENOUS
  Filled 2022-06-08: qty 0.5

## 2022-06-08 MED ORDER — LACTATED RINGERS IV BOLUS
1000.0000 mL | Freq: Once | INTRAVENOUS | Status: AC
  Administered 2022-06-08: 1000 mL via INTRAVENOUS

## 2022-06-08 MED ORDER — ONDANSETRON HCL 4 MG/2ML IJ SOLN
4.0000 mg | Freq: Four times a day (QID) | INTRAMUSCULAR | Status: DC | PRN
  Administered 2022-06-09: 4 mg via INTRAVENOUS
  Filled 2022-06-08: qty 2

## 2022-06-08 MED ORDER — ONDANSETRON HCL 4 MG PO TABS: 4.0000 mg | ORAL_TABLET | Freq: Four times a day (QID) | ORAL | Status: DC | PRN

## 2022-06-08 MED ORDER — SODIUM CHLORIDE 0.9 % IV SOLN
2.0000 g | Freq: Once | INTRAVENOUS | Status: AC
  Administered 2022-06-08: 2 g via INTRAVENOUS
  Filled 2022-06-08: qty 12.5

## 2022-06-08 MED ORDER — METOCLOPRAMIDE HCL 5 MG/ML IJ SOLN
5.0000 mg | Freq: Once | INTRAMUSCULAR | Status: AC
  Administered 2022-06-08: 5 mg via INTRAVENOUS
  Filled 2022-06-08: qty 2

## 2022-06-08 MED ORDER — SODIUM CHLORIDE 0.9 % IV SOLN
2.0000 g | Freq: Once | INTRAVENOUS | Status: AC
  Administered 2022-06-09: 2 g via INTRAVENOUS
  Filled 2022-06-08: qty 12.5

## 2022-06-08 MED ORDER — FENTANYL CITRATE PF 50 MCG/ML IJ SOSY
25.0000 ug | PREFILLED_SYRINGE | Freq: Once | INTRAMUSCULAR | Status: AC
  Administered 2022-06-08: 25 ug via INTRAVENOUS
  Filled 2022-06-08: qty 1

## 2022-06-08 MED ORDER — HYDROMORPHONE HCL 1 MG/ML IJ SOLN
0.5000 mg | INTRAMUSCULAR | Status: DC | PRN
  Administered 2022-06-09: 0.5 mg via INTRAVENOUS
  Filled 2022-06-08: qty 0.5

## 2022-06-08 MED ORDER — METRONIDAZOLE 500 MG/100ML IV SOLN
500.0000 mg | Freq: Once | INTRAVENOUS | Status: AC
  Administered 2022-06-08: 500 mg via INTRAVENOUS
  Filled 2022-06-08: qty 100

## 2022-06-08 MED ORDER — GADOBUTROL 1 MMOL/ML IV SOLN
10.0000 mL | Freq: Once | INTRAVENOUS | Status: AC | PRN
  Administered 2022-06-08: 10 mL via INTRAVENOUS

## 2022-06-08 MED ORDER — METRONIDAZOLE 500 MG/100ML IV SOLN
500.0000 mg | Freq: Two times a day (BID) | INTRAVENOUS | Status: DC
  Administered 2022-06-09: 500 mg via INTRAVENOUS
  Filled 2022-06-08: qty 100

## 2022-06-08 MED ORDER — IOHEXOL 300 MG/ML  SOLN
100.0000 mL | Freq: Once | INTRAMUSCULAR | Status: AC | PRN
  Administered 2022-06-08: 80 mL via INTRAVENOUS

## 2022-06-08 MED ORDER — HYDROMORPHONE HCL 1 MG/ML IJ SOLN
1.0000 mg | Freq: Once | INTRAMUSCULAR | Status: AC
  Administered 2022-06-09: 1 mg via INTRAVENOUS
  Filled 2022-06-08: qty 1

## 2022-06-08 MED ORDER — VANCOMYCIN HCL IN DEXTROSE 1-5 GM/200ML-% IV SOLN
1000.0000 mg | Freq: Once | INTRAVENOUS | Status: AC
  Administered 2022-06-08: 1000 mg via INTRAVENOUS
  Filled 2022-06-08: qty 200

## 2022-06-08 MED ORDER — ONDANSETRON HCL 4 MG/2ML IJ SOLN
4.0000 mg | Freq: Once | INTRAMUSCULAR | Status: AC
  Administered 2022-06-08: 4 mg via INTRAVENOUS
  Filled 2022-06-08: qty 2

## 2022-06-08 MED ORDER — ACETAMINOPHEN 325 MG PO TABS
650.0000 mg | ORAL_TABLET | Freq: Four times a day (QID) | ORAL | Status: DC | PRN
  Administered 2022-06-13 – 2022-06-20 (×4): 650 mg via ORAL
  Filled 2022-06-08 (×4): qty 2

## 2022-06-08 MED ORDER — ACETAMINOPHEN 650 MG RE SUPP: 650.0000 mg | Freq: Four times a day (QID) | RECTAL | Status: DC | PRN

## 2022-06-08 MED ORDER — HEPARIN (PORCINE) 25000 UT/250ML-% IV SOLN
1550.0000 [IU]/h | INTRAVENOUS | Status: DC
  Administered 2022-06-09: 1550 [IU]/h via INTRAVENOUS
  Filled 2022-06-08: qty 250

## 2022-06-08 NOTE — Assessment & Plan Note (Addendum)
Unable to do anticoagulation with drop in hemoglobin and upper GI bleed. 

## 2022-06-08 NOTE — H&P (Signed)
History and Physical    Patient: Xavier Esparza NWG:[id number] DOB: Aug 07, 1946 DOA: 06/08/2022 DOS: the patient was seen and examined on 06/08/2022 PCP: Tonia Ghent, MD  Patient coming from: Home  Chief Complaint:  Chief Complaint  Patient presents with   Loss of Consciousness    HPI: Xavier Esparza is a 76 y.o. male with medical history significant for DM, BPH, history of prostate cancer, kidney stones status post lithotripsy, negative colonoscopy 2022 (3 benign polyps), who presents to the ED by EMS following a syncopal episode at home while walking to the bathroom.  Wife said she heard a loud thud and went to find him on the floor.  She said he was awake when she got there but he could not help himself up and she called 911.  Patient states he was in his usual state of health but about 2 weeks prior he saw a little blood in his urine similar to when he had his kidney stone.  He saw his urologist yesterday who gave him a prescription for Levaquin.  He otherwise denied feeling unwell previous to the episode.  He denied chest pain, shortness of breath, headache or visual disturbance, one-sided numbness weakness or tingling, nausea or vomiting, abdominal pain, urinary frequency or burning or diarrhea.[facility] reported BP 76/45 and he was treated with IM epinephrine and a 400 mL fluid bolus.  He was awake and alert x4 by arrival.  After his arrival in the emergency room he developed abdominal pain and vomiting. ED course and data review: On arrival vitals within normal limits.  Blood work notable for WBC 20,000 with a lactic acid of 5.5, hemoglobin 15.9 with normal platelets.  Creatinine 1.69 up from baseline of 1.28.  Troponin 16.  Lipase 1633 with AST 1756, ALT 530, total bili 1.9, direct bili 0.6 and indirect 1.3.  INR 1.1 acetaminophen level less than 10.  EKG, personally viewed and interpreted with sinus rhythm at 65 with RBBB and nonspecific ST-T wave changes.  Chest x-ray showed airspace disease  right perihilar likely atelectasis CT abdomen and pelvis showed the following findings: IMPRESSION: Moderate acute pancreatitis. No evidence of pancreatic necrosis or pseudocysts.   Mild diffuse gallbladder wall thickening likely secondary to acute pancreatitis, although acute cholecystitis cannot definitely be excluded.   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 Differential diagnosis includes neoplasm and abscess. Consider abdomen MRI without and with contrast for further characterization.   Right nephrolithiasis. No evidence of ureteral calculi or hydronephrosis.   Small hiatal hernia.   Colonic diverticulosis, without radiographic evidence of diverticulitis.  Right upper quadrant ultrasound showed the following  IMPRESSION: 1. Mild gallbladder wall thickening with trace pericholecystic fluid, nonspecific. No evidence for cholelithiasis or gallbladder sludge. 2. Intrahepatic biliary ductal dilation within the right hepatic lobe and biliary hilum. There is superimposed heterogeneity of the hepatic echotexture in this region and ill-defined mixed solid and cystic mass is difficult to exclude. Further evaluation with contrast-enhanced MRI is recommended. 3. Multiple additional simple cysts within the left hepatic lobe.   The ED provider spoke with on-call gastroenterologist Dr. Vicente Males who recommended MRI abdomen.  Okay to admit to this hospital.  Will not need ERCP given location of hepatic lesion as it is too high up.  Can consider IR consult. Hospitalist consulted for admission     Past Medical History:  Diagnosis Date   Adopted    Cataract  resolved with surgery   Diabetes mellitus    Dyslipidemia    Erectile dysfunction    Herpes labialis    Upper Lip   Hyperlipidemia    Male hypogonadism    Prostate cancer [facility])    Renal stones    Thyroid nodule    Right    Past Surgical History:  Procedure Laterality Date   CATARACT EXTRACTION, BILATERAL  2017   Dr. Katy Fitch   COLONOSCOPY WITH PROPOFOL N/A 03/09/2018   Procedure: COLONOSCOPY WITH PROPOFOL;  Surgeon: Lin Landsman, MD;  Location: [facility] ENDOSCOPY;  Service: Gastroenterology;  Laterality: N/A;   COLONOSCOPY WITH PROPOFOL N/A 03/12/2021   Procedure: COLONOSCOPY WITH PROPOFOL;  Surgeon: Lin Landsman, MD;  Location: [place];  Service: Endoscopy;  Laterality: N/A;  priority 4   CYSTOSCOPY W/ URETERAL STENT PLACEMENT  08/09/2017   Procedure: CYSTOSCOPY WITH RETROGRADE PYELOGRAM/URETERAL STENT PLACEMENT;  Surgeon: Royston Cowper, MD;  Location: [facility] ORS;  Service: Urology;;   EXTRACORPOREAL SHOCK WAVE LITHOTRIPSY Left 09/15/2017   Procedure: EXTRACORPOREAL SHOCK WAVE LITHOTRIPSY (ESWL);  Surgeon: Royston Cowper, MD;  Location: [facility] ORS;  Service: Urology;  Laterality: Left;   EYE SURGERY Bilateral 2016   cataract extractions   GREEN LIGHT LASER TURP (TRANSURETHRAL RESECTION OF PROSTATE N/A 04/20/2016   Procedure: GREEN LIGHT LASER TURP (TRANSURETHRAL RESECTION OF PROSTATE;  Surgeon: Royston Cowper, MD;  Location: [facility] ORS;  Service: Urology;  Laterality: N/A;   INGUINAL HERNIA REPAIR  01/11/2012   Procedure: HERNIA REPAIR INGUINAL ADULT;  Surgeon: Pedro Earls, MD;  Location: [place];  Service: General;  Laterality: Right;  Open right inguinal hernia repair with mesh   POLYPECTOMY  03/12/2021   Procedure: POLYPECTOMY;  Surgeon: Lin Landsman, MD;  Location: [place];  Service: Endoscopy;;   RADIOACTIVE SEED IMPLANT     PROSTATE   URETEROSCOPY WITH HOLMIUM LASER LITHOTRIPSY Right 08/09/2017   Procedure: URETEROSCOPY WITH HOLMIUM LASER LITHOTRIPSY;  Surgeon: Royston Cowper, MD;  Location: [facility] ORS;  Service: Urology;  Laterality: Right;   Social History:  reports that he quit smoking about 31 years ago. His smoking use included cigarettes. He  has never used smokeless tobacco. He reports current alcohol use. He reports that he does not use drugs.  No Known Allergies  Family History  Adopted: Yes    Prior to Admission medications   Medication Sig Start Date End Date Taking? Authorizing Provider  acyclovir (ZOVIRAX) 800 MG tablet TAKE 1 TABLET DAILY 01/25/22   Tonia Ghent, MD  Calcium Carbonate (CALCIUM 600 PO) Take 2 tablets by mouth daily.    [provider]  GLUCOSAMINE HCL PO Take 1 tablet by mouth daily.     [provider]  losartan (COZAAR) 25 MG tablet Take 1 tablet (25 mg total) by mouth daily. 12/29/21   Tower, Wynelle Fanny, MD  Magnesium 500 MG TABS Take 1 tablet by mouth at bedtime.    [provider]  metFORMIN (GLUCOPHAGE) 500 MG tablet TAKE 1 TABLET DAILY 01/25/22   Tonia Ghent, MD  Multiple Vitamin (MULTIVITAMIN) tablet Take 1 tablet by mouth daily. Reported on 01/07/2016    [provider]  Omega-3 Fatty Acids ([employer] FISH OIL PO) Take 2 tablets by mouth daily.     [provider]  Red Yeast Rice 600 MG TABS Take 2 tablets (1,200 mg total) by mouth daily. 01/08/22   Tonia Ghent, MD  rosuvastatin (CRESTOR) 20 MG  tablet TAKE 1 TABLET EVERY OTHER  DAY IN THE MORNING. NEEDS TO CALL OFFICE TO SCHEDULE YEARLY EXAM 04/12/22   Tonia Ghent, MD    Physical Exam: Vitals:   06/08/22 1930 06/08/22 2000 06/08/22 2100 06/08/22 2130  BP: (!) 147/88 (!) 156/88 (!) 152/89 (!) 145/81  Pulse: 63 66 (!) 58 72  Resp: (!) 21 (!) 24 18 (!) 26  Temp:      TempSrc:      SpO2: 93% 93% 91% 95%  Weight:      Height:       Physical Exam Vitals and nursing note reviewed.  Constitutional:      General: He is not in acute distress. HENT:     Head: Normocephalic and atraumatic.  Cardiovascular:     Rate and Rhythm: Normal rate and regular rhythm.     Heart sounds: Normal heart sounds.  Pulmonary:     Effort: Tachypnea present.     Breath sounds: Normal breath sounds.      Comments: Conversational dyspnea Abdominal:     Palpations: Abdomen is soft.     Tenderness: There is no abdominal tenderness.  Neurological:     Mental Status: Mental status is at baseline.     Labs on Admission: I have personally reviewed following labs and imaging studies  CBC: Recent Labs  Lab 06/08/22 1057  WBC 19.8*  NEUTROABS 15.6*  HGB 15.9  HCT 50.2  MCV 85.5  PLT 573   Basic Metabolic Panel: Recent Labs  Lab 06/08/22 1201 06/08/22 1253  NA 137  --   K HEMOLYZED SPECIMEN UNABLE TO OBTAIN RESULT 3.4*  CL 96*  --   CO2 25  --   GLUCOSE 160*  --   BUN 22  --   CREATININE 1.69*  --   CALCIUM 9.6  --    GFR: Estimated Creatinine Clearance: 44.1 mL/min (A) (by C-G formula based on SCr of 1.69 mg/dL (H)). Liver Function Tests: Recent Labs  Lab 06/08/22 1810  AST 1,756*  ALT 530*  ALKPHOS 54  BILITOT 1.9*  PROT 6.8  ALBUMIN 3.6   Recent Labs  Lab 06/08/22 1810  LIPASE 1,633*   No results for input(s): "AMMONIA" in the last 168 hours. Coagulation Profile: Recent Labs  Lab 06/08/22 2028  INR 1.1   Cardiac Enzymes: No results for input(s): "CKTOTAL", "CKMB", "CKMBINDEX", "TROPONINI" in the last 168 hours. BNP (last 3 results) No results for input(s): "PROBNP" in the last 8760 hours. HbA1C: No results for input(s): "HGBA1C" in the last 72 hours. CBG: No results for input(s): "GLUCAP" in the last 168 hours. Lipid Profile: No results for input(s): "CHOL", "HDL", "LDLCALC", "TRIG", "CHOLHDL", "LDLDIRECT" in the last 72 hours. Thyroid Function Tests: No results for input(s): "TSH", "T4TOTAL", "FREET4", "T3FREE", "THYROIDAB" in the last 72 hours. Anemia Panel: No results for input(s): "VITAMINB12", "FOLATE", "FERRITIN", "TIBC", "IRON", "RETICCTPCT" in the last 72 hours. Urine analysis:    Component Value Date/Time   COLORURINE AMBER (A) 06/08/2022 1811   APPEARANCEUR CLOUDY (A) 06/08/2022 1811   LABSPEC 1.015 06/08/2022 1811   PHURINE 6.0  06/08/2022 1811   GLUCOSEU 50 (A) 06/08/2022 1811   GLUCOSEU NEGATIVE 11/28/2015 0936   HGBUR LARGE (A) 06/08/2022 1811   BILIRUBINUR NEGATIVE 06/08/2022 1811   KETONESUR NEGATIVE 06/08/2022 1811   PROTEINUR 100 (A) 06/08/2022 1811   UROBILINOGEN 0.2 11/28/2015 0936   NITRITE NEGATIVE 06/08/2022 1811   LEUKOCYTESUR NEGATIVE 06/08/2022 1811    Radiological Exams on Admission:  US ABDOMEN LIMITED RUQ (LIVER/GB)  Result Date: 06/08/2022 CLINICAL DATA:  Right upper quadrant abdominal pain EXAM: ULTRASOUND ABDOMEN LIMITED RIGHT UPPER QUADRANT COMPARISON:  None Available. FINDINGS: Gallbladder: The gallbladder wall is mildly thickened and there is trace pericholecystic fluid, nonspecific. This can be seen in the setting of acute calculus cholecystitis or inflammatory conditions of the biliary tree, as can be seen with cholangitis or pancreatitis. The gallbladder is not distended. No intraluminal stones or sludge is identified. The sonographic Percell Miller sign is reportedly negative. Common bile duct: Diameter: 2 mm in proximal diameter Liver: There is intrahepatic biliary ductal dilation noted within the right hepatic lobe and within the biliary hilum. There is superimposed heterogeneity of the hepatic echotexture in this region, best appreciated on # 92-95 and and ill-defined mixed solid and cystic mass is difficult to exclude. Multiple additional simple cysts are identified within the left hepatic lobe. Portal vein is patent on color Doppler imaging with normal direction of blood flow towards the liver. Other: No ascites IMPRESSION: 1. Mild gallbladder wall thickening with trace pericholecystic fluid, nonspecific. No evidence for cholelithiasis or gallbladder sludge. 2. Intrahepatic biliary ductal dilation within the right hepatic lobe and biliary hilum. There is superimposed heterogeneity of the hepatic echotexture in this region and ill-defined mixed solid and cystic mass is difficult to exclude. Further  evaluation with contrast-enhanced MRI is recommended. 3. Multiple additional simple cysts within the left hepatic lobe. Electronically Signed   By: Fidela Salisbury M.D.   On: 06/08/2022 18:17   CT ABDOMEN PELVIS W CONTRAST  Result Date: 06/08/2022 CLINICAL DATA:  Hematuria.  Hypotension.  Syncopal episode. EXAM: CT ABDOMEN AND PELVIS WITH CONTRAST TECHNIQUE: Multidetector CT imaging of the abdomen and pelvis was performed using the standard protocol following bolus administration of intravenous contrast. RADIATION DOSE REDUCTION: This exam was performed according to the departmental dose-optimization program which includes automated exposure control, adjustment of the mA and/or kV according to patient size and/or use of iterative reconstruction technique. CONTRAST:  47m OMNIPAQUE IOHEXOL 300 MG/ML  SOLN COMPARISON:  None Available. FINDINGS: Lower Chest: No acute findings. Hepatobiliary: Multiple small benign-appearing hepatic cysts are noted. An ill-defined heterogeneous low-attenuation lesion is seen in the anterior segment the right hepatic lobe which measures approximately 9.1 x 6.4 cm on image 34/2. 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 No other suspicious hepatic masses are identified. Mild diffuse gallbladder wall thickening is seen, which could be secondary to acute pancreatitis although acute cholecystitis cannot definitely be excluded. Pancreas: Moderate acute pancreatitis is seen with diffuse peripancreatic inflammatory changes and small amount of fluid extending inferiorly in the right anterior pararenal space. No evidence of pancreatic necrosis or pseudocysts. No evidence of pancreatic mass. Spleen: Within normal limits in size and appearance. Adrenals/Urinary Tract: 2 small less than 1 cm right renal calculi are seen. No evidence of ureteral calculi or hydronephrosis. 5 cm benign-appearing left renal cyst noted (no followup  imaging recommended). No masses identified. Stomach/Bowel: Small hiatal hernia noted. No evidence of obstruction, inflammatory process or abnormal fluid collections. Normal appendix visualized. Diverticulosis is seen mainly involving the descending and sigmoid colon, however there is no evidence of diverticulitis. Vascular/Lymphatic: No pathologically enlarged lymph nodes. No acute vascular findings. Aortic atherosclerotic calcification incidentally noted. Reproductive: Brachytherapy seeds noted throughout the prostate gland. Otherwise unremarkable. Other:  None. Musculoskeletal:  No suspicious bone lesions identified. IMPRESSION: Moderate acute pancreatitis. No evidence of pancreatic necrosis or pseudocysts.  Mild diffuse gallbladder wall thickening likely secondary to acute pancreatitis, although acute cholecystitis cannot definitely be excluded. 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 Differential diagnosis includes neoplasm and abscess. Consider abdomen MRI without and with contrast for further characterization. Right nephrolithiasis. No evidence of ureteral calculi or hydronephrosis. Small hiatal hernia. Colonic diverticulosis, without radiographic evidence of diverticulitis. Aortic Atherosclerosis (ICD10-I70.0). Electronically Signed   By: Marlaine Hind M.D.   On: 06/08/2022 15:55   DG Chest Portable 1 View  Result Date: 06/08/2022 CLINICAL DATA:  Weakness.  Hypotension. EXAM: PORTABLE CHEST 1 VIEW COMPARISON:  August 25, 2018. FINDINGS: EKG leads project over the chest. Cardiomediastinal contours and hilar structures are stable accounting for AP projection and portable technique. No lobar consolidation.  No gross effusion on frontal radiograph. Linear areas of perihilar airspace disease in the RIGHT chest. On limited assessment there is no acute skeletal process. IMPRESSION: Linear areas of perihilar airspace  disease in the RIGHT chest, likely atelectasis. Electronically Signed   By: Zetta Bills M.D.   On: 06/08/2022 14:07     Data Reviewed: Relevant notes from primary care and specialist visits, past discharge summaries as available in EHR, including Care Everywhere. Prior diagnostic testing as pertinent to current admission diagnoses Updated medications and problem lists for reconciliation ED course, including vitals, labs, imaging, treatment and response to treatment Triage notes, nursing and pharmacy notes and ED provider's notes Notable results as noted in HPI   Assessment and Plan: * Severe sepsis ([place]) Severe sepsis intra-abdominal source, possible septic portal vein thrombosis from acute pancreatitis Intra-abdominal sepsis coverage IV sepsis fluids  Syncope Secondary to hypotension, either vasovagal Unable to rule out acute PE with contrasted study given recent CT abdomen and pelvis Consider V/Q in the a.m. 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 We will monitor H&H  Liver mass, right lobe, abscess vs mass 9.1 cm ill-defined heterogeneous mass lesion in anterior segment of right hepatic lobe, with thrombosis of the anterior right portal vein. Differential diagnosis includes neoplasm and abscess.  Follow-up abdomen MRI without and with contrast for further characterization.  Acute pancreatitis Keep n.p.o. IV hydration, IV antiemetics, IV pain medication and supportive care Follow-up abdominal MRI GI consult Trend lipase and LFTs  AKI (acute kidney injury) ([place]) Secondary to sepsis, hypotension with renal hypoperfusion Continue IV fluids  HTN (hypertension) Holding  antihypertensives due to hypotension on arrival, to resume as appropriate  History of prostate cancer No acute issues  Nephrolithiasis Nonobstructing right renal stone.  No acute issues suspected  BPH (benign prostatic hyperplasia) No acute issues  Diabetes mellitus without complication ([place]) Controlled.  Sliding scale insulin coverage        DVT prophylaxis: Heparin infusion  Consults: GI, Dr. Vicente Males  Advance Care Planning:   Code Status: Not on file full  Family Communication: nonenone  Disposition Plan: Back to previous home environment  Severity of Illness: The appropriate patient status for this patient is INPATIENT. Inpatient status is judged to be reasonable and necessary in order to provide the required intensity of service to ensure the patient's safety. The patient's presenting symptoms, physical exam findings, and initial radiographic and laboratory data in the context of their  chronic comorbidities is felt to place them at high risk for further clinical deterioration. Furthermore, it is not anticipated that the patient will be medically stable for discharge from the hospital within 2 midnights of admission.   * I certify that at the point of admission it is my clinical judgment that the patient will require inpatient hospital care spanning beyond 2 midnights from the point of admission due to high intensity of service, high risk for further deterioration and high frequency of surveillance required.*  Author: Athena Masse, MD 06/08/2022 10:05 PM  For on call review [email or web address].

## 2022-06-08 NOTE — Assessment & Plan Note (Addendum)
Controlled.  Sliding scale insulin coverage

## 2022-06-08 NOTE — Assessment & Plan Note (Deleted)
Keep n.p.o. IV hydration, IV antiemetics, IV pain medication and supportive care Follow-up abdominal MRI GI consult Trend lipase and LFTs

## 2022-06-08 NOTE — Assessment & Plan Note (Deleted)
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 Differential diagnosis includes neoplasm and abscess.  Follow-up abdomen MRI without and with contrast for further characterization.

## 2022-06-08 NOTE — Assessment & Plan Note (Signed)
Nonobstructing right renal stone.  No acute issues suspected

## 2022-06-08 NOTE — Assessment & Plan Note (Addendum)
Present on admission secondary to severe necrotizing pancreatitis.  On Zosyn empirically

## 2022-06-08 NOTE — Progress Notes (Incomplete)
ANTICOAGULATION CONSULT NOTE   Pharmacy Consult for heparin infusion Indication: VTE Tx  No Known Allergies  Patient Measurements: Height: 6' (182.9 cm) Weight: 96.6 kg (213 lb) IBW/kg (Calculated) : 77.6 Heparin Dosing Weight: 96.6 kg  Vital Signs: Temp: 97.6 F (36.4 C) (06/13 1048) Temp Source: Oral (06/13 1048) BP: 145/81 (06/13 2130) Pulse Rate: 72 (06/13 2130)  Labs: Recent Labs    06/08/22 1057 06/08/22 1201 06/08/22 1253 06/08/22 2028  HGB 15.9  --   --   --   HCT 50.2  --   --   --   PLT 222  --   --   --   LABPROT  --   --   --  14.5  INR  --   --   --  1.1  CREATININE  --  1.69*  --   --   TROPONINIHS  --  15 16  --     Estimated Creatinine Clearance: 44.1 mL/min (A) (by C-G formula based on SCr of 1.69 mg/dL (H)).   Medical History: Past Medical History:  Diagnosis Date   Adopted    Cataract    resolved with surgery   Diabetes mellitus    Dyslipidemia    Erectile dysfunction    Herpes labialis    Upper Lip   Hyperlipidemia    Male hypogonadism    Prostate cancer ([place])    Renal stones    Thyroid nodule    Right    Assessment: Pt is 76 yo male presenting to ED by EMS following a syncopal episode with possibly septic portal vein thrombus.  Goal of Therapy:  Heparin level 0.3-0.7 units/ml Monitor platelets by anticoagulation protocol: Yes   Plan:  Bolus 6300 units x 1 Start heparin infusion at 1550 units/hr Check HL in 8 hr after start of infusion CBC daily while on heparin  Renda Rolls, PharmD, [facility] 06/08/2022 10:47 PM

## 2022-06-08 NOTE — Assessment & Plan Note (Addendum)
Secondary to hypotension. 

## 2022-06-08 NOTE — ED Triage Notes (Signed)
Patient to ED via [facility] from home for syncope. Patient started taking a new antibiotic yesterday- Levofloxcin. Patient's BP was initially 76/45 with EMS- pt was given 0.3 epi IM and 400 NS from EMS. Patient Aox4 at this time.

## 2022-06-08 NOTE — Assessment & Plan Note (Signed)
No acute issues.

## 2022-06-08 NOTE — Assessment & Plan Note (Deleted)
Holding antihypertensives due to hypotension on arrival, to resume as appropriate

## 2022-06-08 NOTE — ED Provider Notes (Signed)
4:44 PM Assumed care for off going team.   Blood pressure (!) 146/75, pulse (!) 57, temperature 97.6 F (36.4 C), temperature source Oral, resp. rate 14, height 6' (1.829 m), weight 96.6 kg, SpO2 96 %.  See their HPI for full report but in brief pending CT admission.   Moderate acute pancreatitis. No evidence of pancreatic necrosis or  pseudocysts.    Mild diffuse gallbladder wall thickening likely secondary to acute  pancreatitis, although acute cholecystitis cannot definitely be  excluded.    9.1 cm ill-defined heterogeneous mass lesion in anterior segment of  right hepatic lobe, with thrombosis of the anterior right portal  vein. Differential diagnosis includes neoplasm and abscess. Consider  abdomen MRI without and with contrast for further characterization.    Right nephrolithiasis. No evidence of ureteral calculi or  hydronephrosis.    Small hiatal hernia.    Colonic diverticulosis, without radiographic evidence of  diverticulitis.    5:25 PM reevaluated patient he is tender in his right upper quadrant so I added on an ultrasound to evaluate his gallbladder.  Added on hepatic function and lipase testing.  We will wait for these results to order MRI in case patient needs MRCP.  Patient ordered some IV Dilaudid and IV Reglan given he is already on 8 of Zofran and still feeling very nauseous.  8:06 PM patient has significantly elevated lipase with some slight elevation in his LFTs.  He denies any alcohol use or any Tylenol use.  Will discuss with GI  9:35 PM discussed with Dr. Vicente Males from GI patient does not need MRCP and I asked if patient could be admitted prior to MRI resulting and he was okay with patient being admitted here.  Will discuss with hospital team for admission for pancreatitis and further work-up of this liver mass       Vanessa Bennett, MD 06/08/22 2136

## 2022-06-08 NOTE — Assessment & Plan Note (Addendum)
Patient started on CRRT on 06/17/2022

## 2022-06-08 NOTE — ED Provider Notes (Signed)
[facility] Provider Note    Event Date/Time   First MD Initiated Contact with Patient 06/08/22 1050     (approximate)   History   Loss of Consciousness   HPI  Xavier Esparza is a 76 y.o. male  who presents to the emergency department today because of concerns for a syncopal episode.  The patient states that he got up to have a bowel movement when he became weak and passed out.  He says that his stool has been abnormal since he has not eaten any solid food for the past couple of days.  He has been drinking fluids in preparation for an exam that was scheduled for today.  He was ordered a CT abdomen pelvis because of hematuria by his urologist.  He denies any abdominal pain.  Patient denies any fevers.  Denies any chest pain.  Physical Exam   Triage Vital Signs: ED Triage Vitals  Enc Vitals Group     BP 06/08/22 1048 117/69     Pulse Rate 06/08/22 1048 68     Resp 06/08/22 1048 18     Temp 06/08/22 1048 97.6 F (36.4 C)     Temp Source 06/08/22 1048 Oral     SpO2 06/08/22 1048 97 %     Weight 06/08/22 1046 213 lb (96.6 kg)     Height 06/08/22 1046 6' (1.829 m)     Head Circumference --      Peak Flow --      Pain Score 06/08/22 1045 2     Pain Loc --      Pain Edu? --      Excl. in [place]? --     Most recent vital signs: Vitals:   06/08/22 1048  BP: 117/69  Pulse: 68  Resp: 18  Temp: 97.6 F (36.4 C)  SpO2: 97%    General: Awake, alert, oriented. CV:  Good peripheral perfusion. Regular rate and rhythm. Resp:  Normal effort.  Lungs clear. Abd:  No distention.     ED Results / Procedures / Treatments   Labs (all labs ordered are listed, but only abnormal results are displayed) Labs Reviewed  CBC WITH DIFFERENTIAL/PLATELET - Abnormal; Notable for the following components:      Result Value   WBC 19.8 (*)    RBC 5.87 (*)    RDW 16.9 (*)    Neutro Abs 15.6 (*)    Abs Immature Granulocytes 0.11 (*)    All other components within normal  limits  BASIC METABOLIC PANEL - Abnormal; Notable for the following components:   Chloride 96 (*)    Glucose, Bld 160 (*)    Creatinine, Ser 1.69 (*)    GFR, Estimated 41 (*)    Anion gap 16 (*)    All other components within normal limits  POTASSIUM - Abnormal; Notable for the following components:   Potassium 3.4 (*)    All other components within normal limits  LACTIC ACID, PLASMA - Abnormal; Notable for the following components:   Lactic Acid, Venous 5.6 (*)    All other components within normal limits  CULTURE, BLOOD (ROUTINE X 2)  CULTURE, BLOOD (ROUTINE X 2)  URINALYSIS, ROUTINE W REFLEX MICROSCOPIC  TROPONIN I (HIGH SENSITIVITY)  TROPONIN I (HIGH SENSITIVITY)     EKG  I, Nance Pear, attending physician, personally viewed and interpreted this EKG  EKG Time: 1141 Rate: 65 Rhythm: sinus rhythm Axis: left axis deviation Intervals: qtc 464 QRS: RBBB  ST changes: no st elevation Impression: abnormal ekg  RADIOLOGY I independently interpreted and visualized the CXR. My interpretation: No pneumonia. No pneumothorax. Radiology interpretation:  IMPRESSION:  Linear areas of perihilar airspace disease in the RIGHT chest,  likely atelectasis.    PROCEDURES:  Critical Care performed: Yes  Procedures   CRITICAL CARE Performed by: Nance Pear   Total critical care time: 35 minutes  Critical care time was exclusive of separately billable procedures and treating other patients.  Critical care was necessary to treat or prevent imminent or life-threatening deterioration.  Critical care was time spent personally by me on the following activities: development of treatment plan with patient and/or surrogate as well as nursing, discussions with consultants, evaluation of patient's response to treatment, examination of patient, obtaining history from patient or surrogate, ordering and performing treatments and interventions, ordering and review of laboratory  studies, ordering and review of radiographic studies, pulse oximetry and re-evaluation of patient's condition.   MEDICATIONS ORDERED IN ED: Medications - No data to display   IMPRESSION / MDM / [place] / ED COURSE  I reviewed the triage vital signs and the nursing notes.                              Differential diagnosis includes, but is not limited to, vasovagal, anemia, electrolyte abnormality, ACS.  Patient's presentation is most consistent with acute presentation with potential threat to life or bodily function.  Patient presented to the emergency department today after a syncopal episode.  Vital signs without concerning hypotension or tachycardia here in the emergency department.  Blood work however did show a fairly significant leukocytosis.  Somewhat unclear etiology.  Because of elevation however lactic acid was obtained.  This also was elevated.  This did raise suspicion for possible sepsis.  Because of this patient was ordered IV fluids and broad-spectrum antibiotics.  Chest x-ray and CT abdomen/pelvis were ordered.  Chest x-ray without any clear pneumonia.  Awaiting CT abdomen pelvis at time of signout.  FINAL CLINICAL IMPRESSION(S) / ED DIAGNOSES   Final diagnoses:  Syncope, unspecified syncope type  Leukocytosis, unspecified type  Lactic acidosis    Note:  This document was prepared using Dragon voice recognition software and may include unintentional dictation errors.    Nance Pear, MD 06/08/22 [phone]

## 2022-06-09 ENCOUNTER — Inpatient Hospital Stay: Payer: Medicare Other

## 2022-06-09 ENCOUNTER — Inpatient Hospital Stay
Admission: AD | Admit: 2022-06-09 | Payer: Medicare Other | Source: Other Acute Inpatient Hospital | Admitting: Internal Medicine

## 2022-06-09 ENCOUNTER — Inpatient Hospital Stay (HOSPITAL_COMMUNITY)
Admit: 2022-06-09 | Discharge: 2022-06-09 | Disposition: A | Discharge status: Home / Self Care | Payer: Medicare Other | Attending: Internal Medicine | Admitting: Internal Medicine

## 2022-06-09 ENCOUNTER — Other Ambulatory Visit: Payer: Self-pay | Admitting: Radiology

## 2022-06-09 DIAGNOSIS — A419 Sepsis, unspecified organism: Secondary | ICD-10-CM | POA: Diagnosis not present

## 2022-06-09 DIAGNOSIS — K8591 Acute pancreatitis with uninfected necrosis, unspecified: Secondary | ICD-10-CM | POA: Diagnosis not present

## 2022-06-09 DIAGNOSIS — R55 Syncope and collapse: Secondary | ICD-10-CM | POA: Diagnosis not present

## 2022-06-09 DIAGNOSIS — K851 Biliary acute pancreatitis without necrosis or infection: Secondary | ICD-10-CM

## 2022-06-09 LAB — ECHOCARDIOGRAM COMPLETE
AR max vel: 2.74 cm2
AV Area VTI: 2.69 cm2
AV Area mean vel: 2.84 cm2
AV Mean grad: 3 mmHg
AV Peak grad: 7.2 mmHg
Ao pk vel: 1.34 m/s
Area-P 1/2: 2.25 cm2
Height: 72 in
MV VTI: 2.66 cm2
S' Lateral: 3.25 cm
Weight: 3408 oz

## 2022-06-09 LAB — COMPREHENSIVE METABOLIC PANEL
ALT: 621 U/L — ABNORMAL HIGH (ref 0–44)
AST: 1895 U/L — ABNORMAL HIGH (ref 15–41)
Albumin: 3.4 g/dL — ABNORMAL LOW (ref 3.5–5.0)
Alkaline Phosphatase: 57 U/L (ref 38–126)
Anion gap: 10 (ref 5–15)
BUN: 38 mg/dL — ABNORMAL HIGH (ref 8–23)
CO2: 26 mmol/L (ref 22–32)
Calcium: 8.2 mg/dL — ABNORMAL LOW (ref 8.9–10.3)
Chloride: 100 mmol/L (ref 98–111)
Creatinine, Ser: 1.96 mg/dL — ABNORMAL HIGH (ref 0.61–1.24)
GFR, Estimated: 35 mL/min — ABNORMAL LOW (ref >60)
Glucose, Bld: 140 mg/dL — ABNORMAL HIGH (ref 70–99)
Potassium: 4.8 mmol/L (ref 3.5–5.1)
Sodium: 136 mmol/L (ref 135–145)
Total Bilirubin: 1.4 mg/dL — ABNORMAL HIGH (ref 0.3–1.2)
Total Protein: 6.1 g/dL — ABNORMAL LOW (ref 6.5–8.1)

## 2022-06-09 LAB — CBC WITH DIFFERENTIAL/PLATELET
Abs Immature Granulocytes: 0.03 10*3/uL (ref 0.00–0.07)
Basophils Absolute: 0 10*3/uL (ref 0.0–0.1)
Basophils Relative: 0 %
Eosinophils Absolute: 0 10*3/uL (ref 0.0–0.5)
Eosinophils Relative: 0 %
HCT: 43.9 % (ref 39.0–52.0)
Hemoglobin: 14.3 g/dL (ref 13.0–17.0)
Immature Granulocytes: 0 %
Lymphocytes Relative: 6 %
Lymphs Abs: 0.7 10*3/uL (ref 0.7–4.0)
MCH: 27.4 pg (ref 26.0–34.0)
MCHC: 32.6 g/dL (ref 30.0–36.0)
MCV: 84.1 fL (ref 80.0–100.0)
Monocytes Absolute: 1 10*3/uL (ref 0.1–1.0)
Monocytes Relative: 9 %
Neutro Abs: 9.7 10*3/uL — ABNORMAL HIGH (ref 1.7–7.7)
Neutrophils Relative %: 85 %
Platelets: 157 10*3/uL (ref 150–400)
RBC: 5.22 MIL/uL (ref 4.22–5.81)
RDW: 16.4 % — ABNORMAL HIGH (ref 11.5–15.5)
WBC: 11.4 10*3/uL — ABNORMAL HIGH (ref 4.0–10.5)
nRBC: 0 % (ref 0.0–0.2)

## 2022-06-09 LAB — CBG MONITORING, ED
Glucose-Capillary: 111 mg/dL — ABNORMAL HIGH (ref 70–99)
Glucose-Capillary: 122 mg/dL — ABNORMAL HIGH (ref 70–99)
Glucose-Capillary: 127 mg/dL — ABNORMAL HIGH (ref 70–99)
Glucose-Capillary: 142 mg/dL — ABNORMAL HIGH (ref 70–99)

## 2022-06-09 LAB — CORTISOL-AM, BLOOD: Cortisol - AM: 40.1 ug/dL — ABNORMAL HIGH (ref 6.7–22.6)

## 2022-06-09 LAB — PROCALCITONIN: Procalcitonin: 6.81 ng/mL

## 2022-06-09 LAB — LIPASE, BLOOD: Lipase: 1741 U/L — ABNORMAL HIGH (ref 11–51)

## 2022-06-09 IMAGING — NM NM PULMONARY PERF PARTICULATE
1 series · 8 of 8 positions shown · non-contrast
Comparison: Chest x-ray dated [DATE]

CLINICAL DATA: Chest pain

EXAM:
NUCLEAR MEDICINE PERFUSION LUNG SCAN
TECHNIQUE: Perfusion images were obtained in multiple projections after
intravenous injection of radiopharmaceutical.
Ventilation scans intentionally deferred if perfusion scan and chest
x-ray adequate for interpretation during COVID 19 epidemic.
RADIOPHARMACEUTICALS:  3.96 mCi [UT] MAA IV

[Series 1000: lung perfusion · 1.65mm/px · 4 acquisitions, 8 frames shown]
[im 1/4]
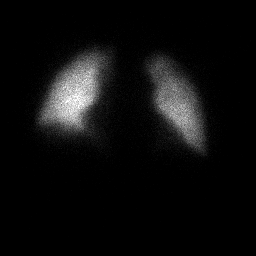
[im 1/4]
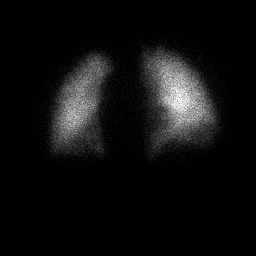
[im 2/4]
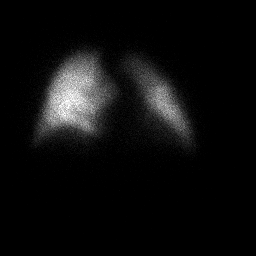
[im 2/4]
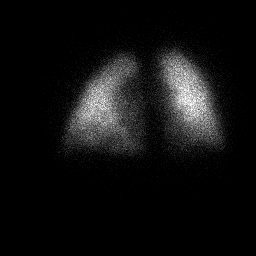
[im 3/4]
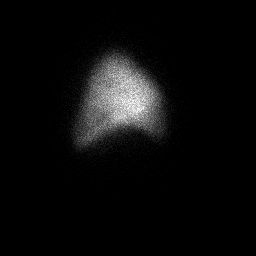
[im 3/4]
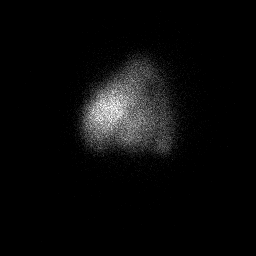
[im 4/4]
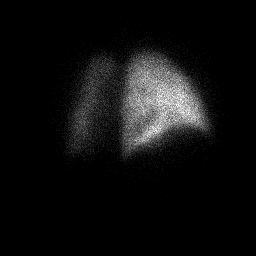
[im 4/4]
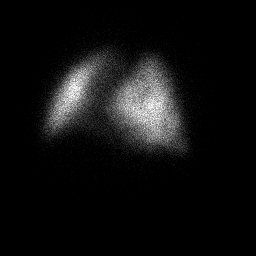

[8 of 8 positions shown; findings below may reference images not displayed]

FINDINGS: No large segmental perfusion defects. Small subsegmental perfusion
defects seen in the right lung.
IMPRESSION: Nondiagnostic (low or intermediate probability)

## 2022-06-09 IMAGING — MR MR MRCP
3 of 6 series · 11 of 48 positions shown · non-contrast
Comparison: MRI of the same date for which this study was done to
complete the evaluation of the biliary tree.

CLINICAL DATA: A 76-year-old male presents for follow-up of imaging
that was acquired earlier on the same date to assess the biliary
tree in the setting of acute necrotic pancreatitis. Please see
earlier MRI for further details regarding pancreatitis and
associated findings.

EXAM:
MRI ABDOMEN WITHOUT CONTRAST  (INCLUDING MRCP)
TECHNIQUE: Multiplanar multisequence MR imaging of the abdomen was performed.
Heavily T2-weighted images of the biliary and pancreatic ducts were
obtained, and three-dimensional MRCP images were rendered by post
processing.

[Series 2: T2 · axial · 6.0mm · 1.19mm/px · z∈[-84,+31]mm · 4 of 17 slices shown]
[im 1/17]
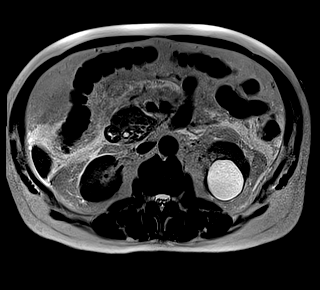
[im 6/17]
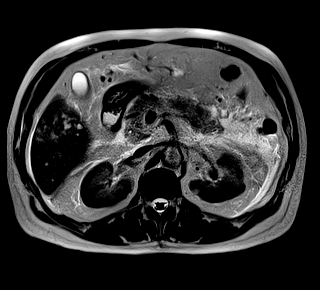
[im 11/17]
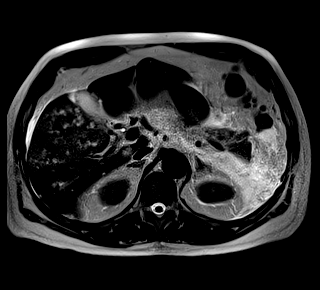
[im 17/17]
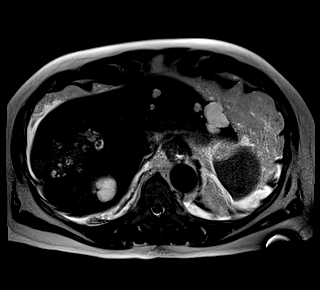

[Series 7: MRCP · coronal · 3.0mm · 1.12mm/px · 6 of 23 slices shown]
[im 1/23]
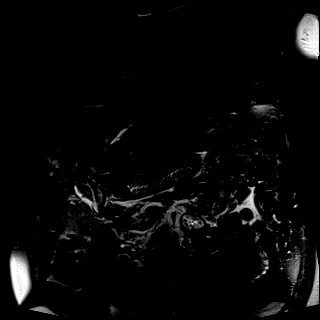
[im 5/23]
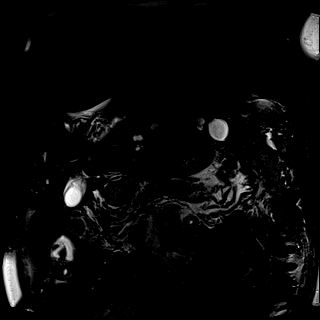
[im 9/23]
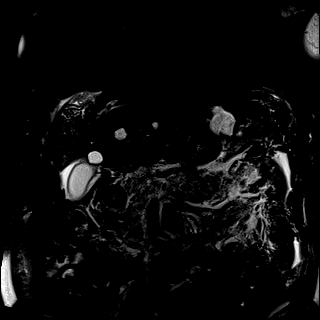
[im 14/23]
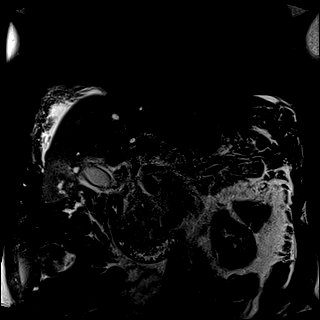
[im 18/23]
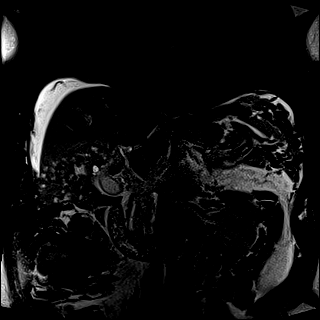
[im 23/23]
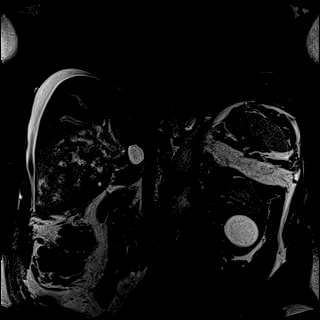

[Series 8: radials · coronal · 50.0mm · 0.78mm/px · 1 of 5 slices shown]
[im 1/5]
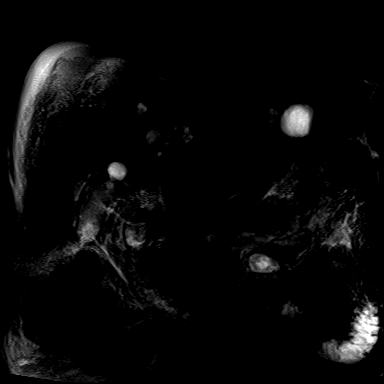

[11 of 48 positions shown; findings below may reference images not displayed]

FINDINGS: Lower chest: Effusions partially visualized in the lower chest. Not
well assessed.

Hepatobiliary: Hepatic findings with hepatic lesions and portal
venous thrombosis as well as hepatic cysts better outlined on
previous MRI evaluation.

There is sludge in the gallbladder. There is gallbladder wall edema
as outlined on previous imaging.

There is no discrete evidence of choledocholithiasis. The biliary
tree is nondilated. The pancreatic duct is nondilated.

Pancreas: Pancreatic edema as seen on recent imaging better
displayed on recent MR evaluation performed earlier the same day.

Spleen:  Not well evaluated better evaluated on previous imaging.

Adrenals/Urinary Tract: Similarly with limited assessment on this
dedicated MRCP.

Stomach/Bowel: Edema of the stomach and adjacent small bowel also
better displayed on recent dedicated abdominal MRI.

Vascular/Lymphatic:  Not well evaluated

Other: Extensive edema throughout the upper abdomen with signs of
perihepatic ascites.

Musculoskeletal: Not well evaluated
IMPRESSION: No signs of choledocholithiasis without dilation of the biliary
tree. See dedicated abdominal MRI for further details regarding
liver and pancreas as well as associated findings of acute necrotic
pancreatitis.

## 2022-06-09 MED ORDER — PROCHLORPERAZINE EDISYLATE 10 MG/2ML IJ SOLN
10.0000 mg | Freq: Four times a day (QID) | INTRAMUSCULAR | Status: DC | PRN
  Administered 2022-06-09 – 2022-06-13 (×7): 10 mg via INTRAVENOUS
  Filled 2022-06-09 (×7): qty 2

## 2022-06-09 MED ORDER — PIPERACILLIN-TAZOBACTAM 3.375 G IVPB
3.3750 g | Freq: Three times a day (TID) | INTRAVENOUS | Status: AC
  Administered 2022-06-09 – 2022-06-10 (×5): 3.375 g via INTRAVENOUS
  Filled 2022-06-09 (×5): qty 50

## 2022-06-09 MED ORDER — METOCLOPRAMIDE HCL 5 MG/ML IJ SOLN
10.0000 mg | Freq: Three times a day (TID) | INTRAMUSCULAR | Status: DC
Start: 2022-06-09 — End: 2022-06-10
  Administered 2022-06-09 – 2022-06-10 (×4): 10 mg via INTRAVENOUS
  Filled 2022-06-09 (×4): qty 2

## 2022-06-09 MED ORDER — HYDROMORPHONE HCL 1 MG/ML IJ SOLN
1.0000 mg | INTRAMUSCULAR | Status: DC | PRN
  Administered 2022-06-09 – 2022-06-10 (×8): 1 mg via INTRAVENOUS
  Filled 2022-06-09 (×8): qty 1

## 2022-06-09 MED ORDER — OXYCODONE HCL 5 MG PO TABS
5.0000 mg | ORAL_TABLET | ORAL | Status: DC | PRN
  Administered 2022-06-09 – 2022-06-12 (×2): 5 mg via ORAL
  Filled 2022-06-09 (×2): qty 1

## 2022-06-09 MED ORDER — TECHNETIUM TO 99M ALBUMIN AGGREGATED
4.0000 | Freq: Once | INTRAVENOUS | Status: AC
  Administered 2022-06-09: 3.96 via INTRAVENOUS
  Filled 2022-06-09: qty 4

## 2022-06-09 NOTE — ED Notes (Signed)
Called [facility] talked W/Tara no bed assignment at the moment

## 2022-06-09 NOTE — ED Notes (Signed)
Notified pharmacy c/o Xavier Esparza , heparin drip is on hold for this pt at this time ,  Per nathan , to call pharmacy when heparin drip restarted  so they can time APTT draw, and recalculate medication

## 2022-06-09 NOTE — Consult Note (Signed)
[name], MD [address]  [place]  [place], [place] [address]  Main: [phone]  Fax: [phone] Pager: [phone]   Consultation  Referring Provider:     No ref. provider found Primary Care Physician:  Tonia Ghent, MD Primary Gastroenterologist: Althia Forts Reason for Consultation:   Elevated LFTs, acute pancreatitis,?  Hepatic lesion    Date of Admission:  06/08/2022 Date of Consultation:  06/09/2022         HPI:   Xavier Esparza is a 76 y.o. male history of diabetes, history of prostate cancer, history of nephrolithiasis s/p lithotripsy presented from home after syncope.  He was found to have significant leukocytosis, hypotension.  Started on treatment with ciprofloxacin by his urologist because of hematuria.  Patient reports that he has been noticing blood in urine about 2 weeks ago.  Admission labs were significant for elevated lactate 5.5, leukocytosis 20 K, serum lipase was 1633.  LFTs significant for AST 1756, ALT 530, T. bili 1.9, alkaline phosphatase 54, serum creatinine 1.96, BUN 38 patient has been aggressively resuscitated, started on broad-spectrum antibiotics.  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 Differential diagnosis includes neoplasm and abscess.   Patient underwent right upper quadrant ultrasound which revealed CBD 2 mm, no evidence of ascites, levels of cholelithiasis, intrahepatic biliary ductal dilation within the right hepatic lobe and biliary hilum.  Patient underwent MRI abdomen today, results are pending.  His vitals have improved with resuscitation, has been afebrile, WBC count improved, down to 11.4.  When I interviewed the patient, he was lying comfortably  in bed.  He was complaining of pain in the epigastric area.  His wife is bedside Patient does not smoke or drink alcohol  NSAIDs: None  Antiplts/Anticoagulants/Anti thrombotics: None  GI Procedures: Colonoscopy 03/12/2021 - Three diminutive polyps in the ascending colon, removed with a cold biopsy forceps. Resected and retrieved. - Diverticulosis in the left colon. - Nonbleeding external hemorrhoids  Past Medical History:  Diagnosis Date   Adopted    Cataract    resolved with surgery   Diabetes mellitus    Dyslipidemia    Erectile dysfunction    Herpes labialis    Upper Lip   Hyperlipidemia    Male hypogonadism    Prostate cancer [facility])    Renal stones    Thyroid nodule    Right    Past Surgical History:  Procedure Laterality Date   CATARACT EXTRACTION, BILATERAL  2017   Dr. Katy Fitch   COLONOSCOPY WITH PROPOFOL N/A 03/09/2018   Procedure: COLONOSCOPY WITH PROPOFOL;  Surgeon: Lin Landsman, MD;  Location: [facility];  Service: Gastroenterology;  Laterality: N/A;   COLONOSCOPY WITH PROPOFOL N/A 03/12/2021   Procedure: COLONOSCOPY WITH PROPOFOL;  Surgeon: Lin Landsman, MD;  Location: [place];  Service: Endoscopy;  Laterality: N/A;  priority 4   CYSTOSCOPY W/ URETERAL STENT PLACEMENT  08/09/2017   Procedure: CYSTOSCOPY WITH RETROGRADE PYELOGRAM/URETERAL STENT PLACEMENT;  Surgeon: Royston Cowper, MD;  Location: [facility];  Service: Urology;;   EXTRACORPOREAL SHOCK WAVE LITHOTRIPSY Left 09/15/2017   Procedure: EXTRACORPOREAL SHOCK WAVE LITHOTRIPSY (ESWL);  Surgeon: Royston Cowper, MD;  Location: [facility];  Service: Urology;  Laterality: Left;   EYE SURGERY Bilateral 2016  cataract extractions   GREEN LIGHT LASER TURP (TRANSURETHRAL RESECTION OF PROSTATE N/A 04/20/2016   Procedure: GREEN LIGHT LASER TURP (TRANSURETHRAL RESECTION OF PROSTATE;  Surgeon: Royston Cowper, MD;  Location: [facility];  Service: Urology;  Laterality: N/A;   INGUINAL HERNIA REPAIR   01/11/2012   Procedure: HERNIA REPAIR INGUINAL ADULT;  Surgeon: Pedro Earls, MD;  Location: [facility];  Service: General;  Laterality: Right;  Open right inguinal hernia repair with mesh   POLYPECTOMY  03/12/2021   Procedure: POLYPECTOMY;  Surgeon: Lin Landsman, MD;  Location: [place];  Service: Endoscopy;;   RADIOACTIVE SEED IMPLANT     PROSTATE   URETEROSCOPY WITH HOLMIUM LASER LITHOTRIPSY Right 08/09/2017   Procedure: URETEROSCOPY WITH HOLMIUM LASER LITHOTRIPSY;  Surgeon: Royston Cowper, MD;  Location: [facility];  Service: Urology;  Laterality: Right;   Current Facility-Administered Medications:    acetaminophen (TYLENOL) tablet 650 mg, 650 mg, Oral, Q6H PRN **OR** acetaminophen (TYLENOL) suppository 650 mg, 650 mg, Rectal, Q6H PRN, Athena Masse, MD   HYDROmorphone (DILAUDID) injection 1 mg, 1 mg, Intravenous, Q3H PRN, Priscella Mann, Sudheer B, MD, 1 mg at 06/09/22 1139   insulin aspart (novoLOG) injection 0-15 Units, 0-15 Units, Subcutaneous, Q4H, Athena Masse, MD, 2 Units at 06/09/22 0804   lactated ringers infusion, , Intravenous, Continuous, Sreenath, Sudheer B, MD, Last Rate: 250 mL/hr at 06/09/22 1550, Restarted at 06/09/22 1550   metoCLOPramide (REGLAN) injection 10 mg, 10 mg, Intravenous, Q8H, Sreenath, Sudheer B, MD, 10 mg at 06/09/22 1419   oxyCODONE (Oxy IR/ROXICODONE) immediate release tablet 5 mg, 5 mg, Oral, Q4H PRN, Priscella Mann, Sudheer B, MD, 5 mg at 06/09/22 0844   piperacillin-tazobactam (ZOSYN) IVPB 3.375 g, 3.375 g, Intravenous, Q8H, Dallie Piles, RPH, Last Rate: 12.5 mL/hr at 06/09/22 1550, Restarted at 06/09/22 1550   prochlorperazine (COMPAZINE) injection 10 mg, 10 mg, Intravenous, Q6H PRN, Priscella Mann, Sudheer B, MD, 10 mg at 06/09/22 1228  Current Outpatient Medications:    acyclovir (ZOVIRAX) 800 MG tablet, TAKE 1 TABLET DAILY, Disp: 90 tablet, Rfl: 1   aspirin EC 81 MG tablet, Take 81 mg by mouth daily. Swallow whole., Disp: ,  Rfl:    Calcium Carbonate (CALCIUM 600 PO), Take 2 tablets by mouth daily., Disp: , Rfl:    GLUCOSAMINE HCL PO, Take 1 tablet by mouth daily. , Disp: , Rfl:    losartan (COZAAR) 25 MG tablet, Take 1 tablet (25 mg total) by mouth daily., Disp: 90 tablet, Rfl: 0   Magnesium 500 MG TABS, Take 1 tablet by mouth daily., Disp: , Rfl:    metFORMIN (GLUCOPHAGE) 500 MG tablet, TAKE 1 TABLET DAILY, Disp: 90 tablet, Rfl: 1   Multiple Vitamin (MULTIVITAMIN) tablet, Take 1 tablet by mouth daily. Reported on 01/07/2016, Disp: , Rfl:    Omega-3 Fatty Acids ([employer] FISH OIL PO), Take 1,000 mg by mouth daily., Disp: , Rfl:    Red Yeast Rice 600 MG TABS, Take 2 tablets (1,200 mg total) by mouth daily., Disp: , Rfl:    rosuvastatin (CRESTOR) 20 MG tablet, TAKE 1 TABLET EVERY OTHER  DAY IN THE MORNING. NEEDS TO CALL OFFICE TO SCHEDULE YEARLY EXAM, Disp: 45 tablet, Rfl: 0   Testosterone 1.62 % GEL, Apply 2 Pump topically daily., Disp: , Rfl:     Family History  Adopted: Yes     Social History   Tobacco Use   Smoking status: Former    Years: 30.00    Types:  Cigarettes    Quit date: 12/27/1990    Years since quitting: 31.4   Smokeless tobacco: Never  Vaping Use   Vaping Use: Never used  Substance Use Topics   Alcohol use: Yes    Comment: Rare Socially   Drug use: No    Allergies as of 06/08/2022   (No Known Allergies)    Review of Systems:    All systems reviewed and negative except where noted in HPI.   Physical Exam:  Vital signs in last 24 hours: Temp:  [98.6 F (37 C)-98.8 F (37.1 C)] 98.8 F (37.1 C) (06/14 1232) Pulse Rate:  [57-76] 76 (06/14 1400) Resp:  [14-26] 20 (06/14 1400) BP: (116-182)/(66-96) 116/66 (06/14 1400) SpO2:  [89 %-97 %] 93 % (06/14 1447)   General:   Pleasant, cooperative in NAD Head:  Normocephalic and atraumatic. Eyes:   No icterus.   Conjunctiva pink. PERRLA. Ears:  Normal auditory acuity. Neck:  Supple; no masses or thyroidomegaly Lungs: Respirations even  and unlabored. Lungs clear to auscultation bilaterally.   No wheezes, crackles, or rhonchi.  Heart:  Regular rate and rhythm;  Without murmur, clicks, rubs or gallops Abdomen:  Soft, nondistended, nontender. Normal bowel sounds. No appreciable masses or hepatomegaly.  No rebound or guarding.  Rectal:  Not performed. Msk:  Symmetrical without gross deformities.  Strength generalized weakness Extremities:  Without edema, cyanosis or clubbing. Neurologic:  Alert and oriented x3;  grossly normal neurologically. Skin:  Intact without significant lesions or rashes. Psych:  Alert and cooperative. Normal affect.  LAB RESULTS:    Latest Ref Rng & Units 06/09/2022    4:15 AM 06/08/2022   10:57 AM 11/28/2015    9:36 AM  CBC  WBC 4.0 - 10.5 K/uL 11.4  19.8  6.3   Hemoglobin 13.0 - 17.0 g/dL 14.3  15.9  14.6   Hematocrit 39.0 - 52.0 % 43.9  50.2  45.0   Platelets 150 - 400 K/uL 157  222  288.0     BMET    Latest Ref Rng & Units 06/09/2022    4:15 AM 06/08/2022   12:53 PM 06/08/2022   12:01 PM  BMP  Glucose 70 - 99 mg/dL 140   160   BUN 8 - 23 mg/dL 38   22   Creatinine 0.61 - 1.24 mg/dL 1.96   1.69   Sodium 135 - 145 mmol/L 136   137   Potassium 3.5 - 5.1 mmol/L 4.8  3.4  HEMOLYZED SPECIMEN UNABLE TO OBTAIN RESULT   Chloride 98 - 111 mmol/L 100   96   CO2 22 - 32 mmol/L 26   25   Calcium 8.9 - 10.3 mg/dL 8.2   9.6     LFT    Latest Ref Rng & Units 06/09/2022    4:15 AM 06/08/2022    6:10 PM 07/02/2021    8:46 AM  Hepatic Function  Total Protein 6.5 - 8.1 g/dL 6.1  6.8  7.1   Albumin 3.5 - 5.0 g/dL 3.4  3.6  4.0   AST 15 - 41 U/L 1,895  1,756  26   ALT 0 - 44 U/L 621  530  16   Alk Phosphatase 38 - 126 U/L 57  54  36   Total Bilirubin 0.3 - 1.2 mg/dL 1.4  1.9  1.0   Bilirubin, Direct 0.0 - 0.2 mg/dL  0.6       STUDIES: MR ABDOMEN MRCP WO CONTRAST  Result Date: 06/09/2022  CLINICAL DATA:  A 76 year old male presents for follow-up of imaging that was acquired earlier on the same date  to assess the biliary tree in the setting of acute necrotic pancreatitis. Please see earlier MRI for further details regarding pancreatitis and associated findings. EXAM: MRI ABDOMEN WITHOUT CONTRAST  (INCLUDING MRCP) TECHNIQUE: Multiplanar multisequence MR imaging of the abdomen was performed. Heavily T2-weighted images of the biliary and pancreatic ducts were obtained, and three-dimensional MRCP images were rendered by post processing. COMPARISON:  MRI of the same date for which this study was done to complete the evaluation of the biliary tree. FINDINGS: Lower chest: Effusions partially visualized in the lower chest. Not well assessed. Hepatobiliary: Hepatic findings with hepatic lesions and portal venous thrombosis as well as hepatic cysts better outlined on previous MRI evaluation. There is sludge in the gallbladder. There is gallbladder wall edema as outlined on previous imaging. There is no discrete evidence of choledocholithiasis. The biliary tree is nondilated. The pancreatic duct is nondilated. Pancreas: Pancreatic edema as seen on recent imaging better displayed on recent MR evaluation performed earlier the same day. Spleen:  Not well evaluated better evaluated on previous imaging. Adrenals/Urinary Tract: Similarly with limited assessment on this dedicated MRCP. Stomach/Bowel: Edema of the stomach and adjacent small bowel also better displayed on recent dedicated abdominal MRI. Vascular/Lymphatic:  Not well evaluated Other: Extensive edema throughout the upper abdomen with signs of perihepatic ascites. Musculoskeletal: Not well evaluated IMPRESSION: No signs of choledocholithiasis without dilation of the biliary tree. See dedicated abdominal MRI for further details regarding liver and pancreas as well as associated findings of acute necrotic pancreatitis. Electronically Signed   By: Zetta Bills M.D.   On: 06/09/2022 15:54   MR 3D Recon At Scanner  Result Date: 06/09/2022 CLINICAL DATA:  A  76 year old male presents for follow-up of imaging that was acquired earlier on the same date to assess the biliary tree in the setting of acute necrotic pancreatitis. Please see earlier MRI for further details regarding pancreatitis and associated findings. EXAM: MRI ABDOMEN WITHOUT CONTRAST  (INCLUDING MRCP) TECHNIQUE: Multiplanar multisequence MR imaging of the abdomen was performed. Heavily T2-weighted images of the biliary and pancreatic ducts were obtained, and three-dimensional MRCP images were rendered by post processing. COMPARISON:  MRI of the same date for which this study was done to complete the evaluation of the biliary tree. FINDINGS: Lower chest: Effusions partially visualized in the lower chest. Not well assessed. Hepatobiliary: Hepatic findings with hepatic lesions and portal venous thrombosis as well as hepatic cysts better outlined on previous MRI evaluation. There is sludge in the gallbladder. There is gallbladder wall edema as outlined on previous imaging. There is no discrete evidence of choledocholithiasis. The biliary tree is nondilated. The pancreatic duct is nondilated. Pancreas: Pancreatic edema as seen on recent imaging better displayed on recent MR evaluation performed earlier the same day. Spleen:  Not well evaluated better evaluated on previous imaging. Adrenals/Urinary Tract: Similarly with limited assessment on this dedicated MRCP. Stomach/Bowel: Edema of the stomach and adjacent small bowel also better displayed on recent dedicated abdominal MRI. Vascular/Lymphatic:  Not well evaluated Other: Extensive edema throughout the upper abdomen with signs of perihepatic ascites. Musculoskeletal: Not well evaluated IMPRESSION: No signs of choledocholithiasis without dilation of the biliary tree. See dedicated abdominal MRI for further details regarding liver and pancreas as well as associated findings of acute necrotic pancreatitis. Electronically Signed   By: Zetta Bills M.D.   On:  06/09/2022 15:54   NM Pulmonary  Perfusion  Result Date: 06/09/2022 CLINICAL DATA:  Chest pain EXAM: NUCLEAR MEDICINE PERFUSION LUNG SCAN TECHNIQUE: Perfusion images were obtained in multiple projections after intravenous injection of radiopharmaceutical. Ventilation scans intentionally deferred if perfusion scan and chest x-ray adequate for interpretation during COVID 19 epidemic. RADIOPHARMACEUTICALS:  3.96 mCi Tc-47mMAA IV COMPARISON:  Chest x-ray dated June 08, 2022 FINDINGS: No large segmental perfusion defects. Small subsegmental perfusion defects seen in the right lung. IMPRESSION: Nondiagnostic (low or intermediate probability) Electronically Signed   By: LYetta GlassmanM.D.   On: 06/09/2022 14:25   MR ABDOMEN W WO CONTRAST  Result Date: 06/09/2022 CLINICAL DATA:  Abnormalities on recent CT evaluation. EXAM: MRI ABDOMEN WITHOUT AND WITH CONTRAST TECHNIQUE: Multiplanar multisequence MR imaging of the abdomen was performed both before and after the administration of intravenous contrast. CONTRAST:  143mGADAVIST GADOBUTROL 1 MMOL/ML IV SOLN COMPARISON:  Comparison made with CT evaluation of January 08, 2022. FINDINGS: Lower chest: Incidental imaging of the lung bases without effusion or consolidation. Hepatobiliary: Multi cystic appearing area in the RIGHT hepatic lobe within the distribution of portal venous thrombus showing variable perfusion in this area measuring 9.5 x 6.4 cm (image 33/13) portal venous thrombus to RIGHT superior and anterior portal venous branches subtending hepatic subsegment VIII and V. there are areas of intrinsic T1 hyperintensity that surround this lesion and are interspersed throughout the lesion. A second hypovascular lesion is demonstrated in the posterior LEFT hemiliver, lateral segment (image 28/13) also with intrinsic T1 hyperintensity measuring 17 mm. Perfusional changes in the area related to portal venous thrombus, less conspicuous on delayed phase. Hepatic cysts  elsewhere in the liver. Mild pericholecystic fluid and edema in the gallbladder wall with mild enhancement of the gallbladder wall. Mild intrahepatic biliary duct distension. Subtle area of T2 hypointensity along the distal common bile duct up to 2-3 mm is nonspecific on thick section non MRCP sequences. Note that the study was not performed as an MRCP. Signs of peribiliary enhancement in the setting of acute pancreatitis, see below. Pancreas: Extensive peripancreatic and interstitial pancreatic edema compatible with pancreatitis. Diffuse edema and developing fluid without focal characteristics tracking throughout the anterior pararenal space and about the peripancreatic fat and lesser sac. The amount of fluid and edema is increased over the short interval since the CT which was performed 8 hours prior to this exam. Edema blunts the intrinsic T1 signal that is seen in the pancreas. An area of non enhancement is present in the tail of the pancreas. This measures 2.8 x 2.4 cm (image 47/17) an additional area of pancreatic necrosis present in the neck of the pancreas (image 52/17 measuring 2.4 cm in the axial plane. There is no sign of ductal dilation. Spleen: Splenic vein is patent. Spleen is normal size without focal lesion. Edema tracks towards the spleen in the LEFT upper quadrant, increased since previous imaging as discussed. Adrenals/Urinary Tract:  Adrenal glands are normal. Bosniak category I cyst in the lower pole the LEFT kidney measuring 4.7 cm for which no follow-up is recommended in the absence of localizing symptoms. No suspicious renal lesion or hydronephrosis. 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 Main portal vein is patent as are LEFT portal branches. SMV is patent and splenic vein is patent. Normal caliber of the abdominal aorta. No  adenopathy in the abdomen. Other: Small volume ascites. Anterior pararenal fluid and  edema measuring up to 9.8 x 2.8 cm on the RIGHT is currently nonfocal and is increased in volume also along the LEFT hemiabdomen tracking towards the spleen and throughout the anterior pararenal space. Musculoskeletal: No suspicious bone lesions identified. IMPRESSION: 1. Multi cystic area in the RIGHT hepatic lobe following a vascular distribution, affected by portal venous thrombus, in isolation is most suspicious for developing micro abscesses in the setting of poor vascular flow related to portal thrombus and acute necrotic pancreatitis. There is however intrinsic T1 hyperintensity which can be seen in the setting of hemorrhage, mucin or melanin (melanoma, felt unlikely based on distribution and associated findings at this time). Alternative neoplastic considerations remain in the differential due to a second smaller lesion in the LEFT hepatic lobe which is away from the affected vascular distribution, also with intrinsic T1 hyperintensity. For this reason sampling of the area in the RIGHT hepatic lobe may be helpful to exclude underlying malignancy and obtain a sample for microbiologic assessment. Ultimately, 8-12 week follow-up after therapy even if biopsy is negative is suggested to ensure resolution. 2. Acute necrotic and edematous pancreatitis with worsening since previous imaging. Focal areas of necrosis can lead to ductal interruption and for this reason close follow-up is suggested. 3. Mild intrahepatic biliary duct distension. Subtle area of T2 hypointensity along the distal common bile duct cannot be confirmed as intraductal stone and may simply represent volume averaging but does raise the question of intraductal calculus. Dedicated MRCP may be helpful in the setting of worsening pancreatitis. 4. Gallbladder edema with under distension favored to be secondary to severe acute pancreatitis, attention on follow-up. Could  consider HIDA scan as warranted if there is continued concern for concomitant cholecystitis. 5. Increase in anterior pararenal fluid since previous imaging related to worsening of pancreatitis. These results will be called to the ordering clinician or representative by the Radiologist Assistant, and communication documented in the PACS or [employer]. Electronically Signed   By: Zetta Bills M.D.   On: 06/09/2022 09:10   US ABDOMEN LIMITED RUQ (LIVER/GB)  Result Date: 06/08/2022 CLINICAL DATA:  Right upper quadrant abdominal pain EXAM: ULTRASOUND ABDOMEN LIMITED RIGHT UPPER QUADRANT COMPARISON:  None Available. FINDINGS: Gallbladder: The gallbladder wall is mildly thickened and there is trace pericholecystic fluid, nonspecific. This can be seen in the setting of acute calculus cholecystitis or inflammatory conditions of the biliary tree, as can be seen with cholangitis or pancreatitis. The gallbladder is not distended. No intraluminal stones or sludge is identified. The sonographic Percell Miller sign is reportedly negative. Common bile duct: Diameter: 2 mm in proximal diameter Liver: There is intrahepatic biliary ductal dilation noted within the right hepatic lobe and within the biliary hilum. There is superimposed heterogeneity of the hepatic echotexture in this region, best appreciated on # [id number] and and ill-defined mixed solid and cystic mass is difficult to exclude. Multiple additional simple cysts are identified within the left hepatic lobe. Portal vein is patent on color Doppler imaging with normal direction of blood flow towards the liver. Other: No ascites IMPRESSION: 1. Mild gallbladder wall thickening with trace pericholecystic fluid, nonspecific. No evidence for cholelithiasis or gallbladder sludge. 2. Intrahepatic biliary ductal dilation within the right hepatic lobe and biliary hilum. There is superimposed heterogeneity of the hepatic echotexture in this region and ill-defined mixed solid and cystic  mass is difficult to exclude. Further evaluation with contrast-enhanced MRI is recommended. 3. Multiple additional simple cysts within the left hepatic lobe. Electronically Signed   By: Cassandria Anger  Christa See M.D.   On: 06/08/2022 18:17   CT ABDOMEN PELVIS W CONTRAST  Result Date: 06/08/2022 CLINICAL DATA:  Hematuria.  Hypotension.  Syncopal episode. EXAM: CT ABDOMEN AND PELVIS WITH CONTRAST TECHNIQUE: Multidetector CT imaging of the abdomen and pelvis was performed using the standard protocol following bolus administration of intravenous contrast. RADIATION DOSE REDUCTION: This exam was performed according to the departmental dose-optimization program which includes automated exposure control, adjustment of the mA and/or kV according to patient size and/or use of iterative reconstruction technique. CONTRAST:  30m OMNIPAQUE IOHEXOL 300 MG/ML  SOLN COMPARISON:  None Available. FINDINGS: Lower Chest: No acute findings. Hepatobiliary: Multiple small benign-appearing hepatic cysts are noted. An ill-defined heterogeneous low-attenuation lesion is seen in the anterior segment the right hepatic lobe which measures approximately 9.1 x 6.4 cm on image 34/2. 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 No other suspicious hepatic masses are identified. Mild diffuse gallbladder wall thickening is seen, which could be secondary to acute pancreatitis although acute cholecystitis cannot definitely be excluded. Pancreas: Moderate acute pancreatitis is seen with diffuse peripancreatic inflammatory changes and small amount of fluid extending inferiorly in the right anterior pararenal space. No evidence of pancreatic necrosis or pseudocysts. No evidence of pancreatic mass. Spleen: Within normal limits in size and appearance. Adrenals/Urinary Tract: 2 small less than 1 cm right renal calculi are seen. No evidence of ureteral calculi or hydronephrosis. 5 cm benign-appearing  left renal cyst noted (no followup imaging recommended). No masses identified. Stomach/Bowel: Small hiatal hernia noted. No evidence of obstruction, inflammatory process or abnormal fluid collections. Normal appendix visualized. Diverticulosis is seen mainly involving the descending and sigmoid colon, however there is no evidence of diverticulitis. Vascular/Lymphatic: No pathologically enlarged lymph nodes. No acute vascular findings. Aortic atherosclerotic calcification incidentally noted. Reproductive: Brachytherapy seeds noted throughout the prostate gland. Otherwise unremarkable. Other:  None. Musculoskeletal:  No suspicious bone lesions identified. IMPRESSION: Moderate acute pancreatitis. No evidence of pancreatic necrosis or pseudocysts. Mild diffuse gallbladder wall thickening likely secondary to acute pancreatitis, although acute cholecystitis cannot definitely be excluded. 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 Differential diagnosis includes neoplasm and abscess. Consider abdomen MRI without and with contrast for further characterization. Right nephrolithiasis. No evidence of ureteral calculi or hydronephrosis. Small hiatal hernia. Colonic diverticulosis, without radiographic evidence of diverticulitis. Aortic Atherosclerosis (ICD10-I70.0). Electronically Signed   By: JMarlaine HindM.D.   On: 06/08/2022 15:55   DG Chest Portable 1 View  Result Date: 06/08/2022 CLINICAL DATA:  Weakness.  Hypotension. EXAM: PORTABLE CHEST 1 VIEW COMPARISON:  August 25, 2018. FINDINGS: EKG leads project over the chest. Cardiomediastinal contours and hilar structures are stable accounting for AP projection and portable technique. No lobar consolidation.  No gross effusion on frontal radiograph. Linear areas of perihilar airspace disease in the RIGHT chest. On limited assessment there is no acute skeletal process. IMPRESSION:  Linear areas of perihilar airspace disease in the RIGHT chest, likely atelectasis. Electronically Signed   By: GZetta BillsM.D.   On: 06/08/2022 14:07      Impression / Plan:   Xavier SELSORis a 76y.o. 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 There is no dilation of biliary tree or pancreatic duct No history of alcohol abuse Check serum triglyceride levels at a later stage as they could be falsely elevated in acute inflammatory setting Continue with current resuscitation with maintenance IV fluids Maintain n.p.o. Monitor closely for signs of infection Patient is currently on gram-negative coverage Recommend transfer to tertiary care center due to lack of ability at [facility] for any emergent/urgent intervention if patient decompensates secondary to acute necrotizing pancreatitis  Liver lesions and elevated LFTs MRI abdomen with contrast is suggestive of developing microabscesses vs neoplasm and recommended ultrasound-guided liver biopsy at a later stage There is no evidence of choledocholithiasis on MRCP, therefore no indication for ERCP at this time Predominantly hepatocellular pattern, preserved synthetic function, normal PT/INR Check viral hepatitis panel Monitor LFTs closely, avoid hepatotoxic agents  Severe sepsis: Clinically stable, not on pressor support Patient was initiated on broad-spectrum antibiotics, currently on gram-negative coverage only Leukocytosis is improving Blood cultures are pending  Thank you for involving me in the care of this patient.  Please call GI back with any questions or concerns    LOS: 1 day   Sherri Sear, MD   06/09/2022, 4:14 PM    Note: This dictation was prepared with Dragon dictation along with smaller phrase technology. Any transcriptional errors that result from this process are unintentional.

## 2022-06-09 NOTE — ED Notes (Signed)
Called Helaine Chess, he stated patient waiting for progressive bed at [id number]

## 2022-06-09 NOTE — Discharge Summary (Signed)
Physician Discharge Summary  Xavier Esparza:[id number] DOB: [birth year] DOA: 06/08/2022  PCP: Tonia Ghent, MD  Admit date: 06/08/2022 Discharge date: 06/09/2022  Admitted From: Home Disposition: Transfer to [facility]  Recommendations for Outpatient Follow-up:  [place] II: N/A Equipment/Devices: None  Discharge Condition: Stable CODE STATUS: Full Diet recommendation: N.p.o. for now  Brief/Interim Summary: 76 y.o. male with medical history significant for DM, BPH, history of prostate cancer, kidney stones status post lithotripsy, negative colonoscopy 2022 (3 benign polyps), who presents to the ED by EMS following a syncopal episode at home while walking to the bathroom.  Wife said she heard a loud thud and went to find him on the floor.  She said he was awake when she got there but he could not help himself up and she called 911.  Patient states he was in his usual state of health but about 2 weeks prior he saw a little blood in his urine similar to when he had his kidney stone.  He saw his urologist yesterday who gave him a prescription for Levaquin.  He otherwise denied feeling unwell previous to the episode.  He denied chest pain, shortness of breath, headache or visual disturbance, one-sided numbness weakness or tingling, nausea or vomiting, abdominal pain, urinary frequency or burning or diarrhea.[facility] reported BP 76/45 and he was treated with IM epinephrine and a 400 mL fluid bolus.  He was awake and alert x4 by arrival.  After his arrival in the emergency room he developed abdominal pain and vomiting.  Initial CT abdomen pelvis with moderate acute pancreatitis no evidence of pancreatic necrosis or pseudocyst.  Follow-up MRI abdomen concerning for possible choledocholithiasis with evidence of necrotizing pancreatitis.  Case discussed with GI Dr. Marius Ditch at [facility].  There was mention of possible portal vein thrombosis.  Heparin GTT was initially started however patient  developed suspected acute hematuria so heparin GTT was stopped.  VQ scan ordered and perfusion component will be completed prior to patient transfer.  Per Dr. Marius Ditch this patient has evidence of necrotizing pancreatitis and is at risk for development of ascending cholangitis.  He has significant transaminitis with overriding lipase elevation consistent with pancreatitis.  Given clinical status and need for possible urgent ERCP transfer recommended to [facility] assistance from GI.  GI at [facility] notified Dr. Lunette Stands [facility], GI.  at time of discharge patient is hemodynamically stable.  I have requested a progressive care bed.  Given concern for choledocholithiasis and risk of developing ascending cholangitis the patient has been on aggressive intravenous fluids, IV antiemetics, IV pain control, IV Zosyn.  Recommend these interventions be continued on arrival to [facility].  Please notify Dr. Carlean Purl or oncology patient admission.  MRCP was ordered today [place].  If we are able to complete the study before transfer we will completed here otherwise will need MRCP and GI consult on arrival to [facility].    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  Initial CT with a  9.1 cm ill defined heterogeneous mass in anterior segment of right hepatic lobe.  Thrombosis of portal vein noted.  Differentials on arrival included neoplasm versus abscess.  MRCP is pending at  time of discharge.  MRI abdomen pelvis revealed evidence of necrotizing pancreatitis with possibility of choledocholithiasis.  Considering need for possible ERCP, not available at [facility] this week, patient will require transfer to [facility].  GI at [facility] engaged.  Discussed directly with GI Dr. Arelia Longest at [facility] who will need to be consulted when patient arrives. Plan: Transfer to [facility]  Patient was on aggressive intravenous fluids, IV Zosyn, IV antiemetics, IV pain control while at [facility].  Recommend these interventions to be continued on arrival to [facility].  Please notify Dr. Carlean Purl or on-call GI when patient arrives.  Discharge Instructions  Discharge Instructions     Diet - low sodium heart healthy   Complete by: As directed    Increase activity slowly   Complete by: As directed       Allergies as of 06/09/2022   No Known Allergies      Medication List     TAKE these medications    acyclovir 800 MG tablet Commonly known as: ZOVIRAX TAKE 1 TABLET DAILY   aspirin EC 81 MG tablet Take 81 mg by mouth daily. Swallow whole.   CALCIUM 600 PO Take 2 tablets by mouth daily.   [employer] FISH OIL PO Take 1,000 mg by mouth daily.   GLUCOSAMINE HCL PO Take 1 tablet by mouth daily.   losartan 25 MG tablet Commonly known as: COZAAR Take 1 tablet (25 mg total) by mouth daily.   Magnesium 500 MG Tabs Take 1 tablet by mouth daily.   metFORMIN 500 MG tablet Commonly known as: GLUCOPHAGE TAKE 1 TABLET DAILY   multivitamin tablet Take 1 tablet by mouth daily. Reported on 01/07/2016   Red Yeast Rice 600 MG Tabs Take 2 tablets (1,200 mg total) by mouth daily.   rosuvastatin 20 MG tablet Commonly known as: CRESTOR TAKE 1 TABLET EVERY OTHER  DAY IN THE MORNING. NEEDS TO CALL OFFICE TO SCHEDULE YEARLY EXAM   Testosterone 1.62 % Gel Apply 2 Pump topically daily.        No Known Allergies  Consultations: GI   Procedures/Studies: MR  ABDOMEN W WO CONTRAST  Result Date: 06/09/2022 CLINICAL DATA:  Abnormalities on recent CT evaluation. EXAM: MRI ABDOMEN WITHOUT AND WITH CONTRAST TECHNIQUE: Multiplanar multisequence MR imaging of the abdomen was performed both before and after the administration of intravenous contrast. CONTRAST:  71m GADAVIST GADOBUTROL 1 MMOL/ML IV SOLN COMPARISON:  Comparison made with CT evaluation of January 08, 2022. FINDINGS: Lower chest: Incidental imaging of the lung bases without effusion or consolidation. Hepatobiliary: Multi cystic appearing area in the RIGHT hepatic lobe within the distribution of portal venous thrombus showing variable perfusion in this area measuring 9.5 x 6.4 cm (image 33/13) portal venous thrombus to RIGHT superior and anterior portal venous branches subtending hepatic subsegment VIII and V. there are areas of intrinsic T1 hyperintensity that surround this lesion and are interspersed throughout the lesion. A second hypovascular lesion is demonstrated in the posterior LEFT hemiliver, lateral segment (image 28/13) also with intrinsic T1 hyperintensity measuring 17 mm. Perfusional changes in the area related to portal venous thrombus, less conspicuous on delayed phase. Hepatic cysts elsewhere in the liver. Mild pericholecystic fluid and edema in the gallbladder wall with mild enhancement of the gallbladder wall. Mild intrahepatic  biliary duct distension. Subtle area of T2 hypointensity along the distal common bile duct up to 2-3 mm is nonspecific on thick section non MRCP sequences. Note that the study was not performed as an MRCP. Signs of peribiliary enhancement in the setting of acute pancreatitis, see below. Pancreas: Extensive peripancreatic and interstitial pancreatic edema compatible with pancreatitis. Diffuse edema and developing fluid without focal characteristics tracking throughout the anterior pararenal space and about the peripancreatic fat and lesser sac. The amount of fluid and edema  is increased over the short interval since the CT which was performed 8 hours prior to this exam. Edema blunts the intrinsic T1 signal that is seen in the pancreas. An area of non enhancement is present in the tail of the pancreas. This measures 2.8 x 2.4 cm (image 47/17) an additional area of pancreatic necrosis present in the neck of the pancreas (image 52/17 measuring 2.4 cm in the axial plane. There is no sign of ductal dilation. Spleen: Splenic vein is patent. Spleen is normal size without focal lesion. Edema tracks towards the spleen in the LEFT upper quadrant, increased since previous imaging as discussed. Adrenals/Urinary Tract:  Adrenal glands are normal. Bosniak category I cyst in the lower pole the LEFT kidney measuring 4.7 cm for which no follow-up is recommended in the absence of localizing symptoms. No suspicious renal lesion or hydronephrosis. 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 Main portal vein is patent as are LEFT portal branches. SMV is patent and splenic vein is patent. Normal caliber of the abdominal aorta. No adenopathy in the abdomen. Other: Small volume ascites. Anterior pararenal fluid and edema measuring up to 9.8 x 2.8 cm on the RIGHT is currently nonfocal and is increased in volume also along the LEFT hemiabdomen tracking towards the spleen and throughout the anterior pararenal space. Musculoskeletal: No suspicious bone lesions identified. IMPRESSION: 1. Multi cystic area in the RIGHT hepatic lobe following a vascular distribution, affected by portal venous thrombus, in isolation is most suspicious for developing micro abscesses in the setting of poor vascular flow related to portal thrombus and acute necrotic pancreatitis. There is however intrinsic T1 hyperintensity which can be seen in the setting of hemorrhage,  mucin or melanin (melanoma, felt unlikely based on distribution and associated findings at this time). Alternative neoplastic considerations remain in the differential due to a second smaller lesion in the LEFT hepatic lobe which is away from the affected vascular distribution, also with intrinsic T1 hyperintensity. For this reason sampling of the area in the RIGHT hepatic lobe may be helpful to exclude underlying malignancy and obtain a sample for microbiologic assessment. Ultimately, 8-12 week follow-up after therapy even if biopsy is negative is suggested to ensure resolution. 2. Acute necrotic and edematous pancreatitis with worsening since previous imaging. Focal areas of necrosis can lead to ductal interruption and for this reason close follow-up is suggested. 3. Mild intrahepatic biliary duct distension. Subtle area of T2 hypointensity along the distal common bile duct cannot be confirmed as intraductal stone and may simply represent volume averaging but does raise the question of intraductal calculus. Dedicated MRCP may be helpful in the setting of worsening pancreatitis. 4. Gallbladder edema with under distension favored to be secondary to severe acute pancreatitis, attention on follow-up. Could consider HIDA scan as warranted if there is continued concern for concomitant cholecystitis. 5. Increase in anterior pararenal fluid since  previous imaging related to worsening of pancreatitis. These results will be called to the ordering clinician or representative by the Radiologist Assistant, and communication documented in the PACS or [employer]. Electronically Signed   By: Zetta Bills M.D.   On: 06/09/2022 09:10   US ABDOMEN LIMITED RUQ (LIVER/GB)  Result Date: 06/08/2022 CLINICAL DATA:  Right upper quadrant abdominal pain EXAM: ULTRASOUND ABDOMEN LIMITED RIGHT UPPER QUADRANT COMPARISON:  None Available. FINDINGS: Gallbladder: The gallbladder wall is mildly thickened and there is trace  pericholecystic fluid, nonspecific. This can be seen in the setting of acute calculus cholecystitis or inflammatory conditions of the biliary tree, as can be seen with cholangitis or pancreatitis. The gallbladder is not distended. No intraluminal stones or sludge is identified. The sonographic Percell Miller sign is reportedly negative. Common bile duct: Diameter: 2 mm in proximal diameter Liver: There is intrahepatic biliary ductal dilation noted within the right hepatic lobe and within the biliary hilum. There is superimposed heterogeneity of the hepatic echotexture in this region, best appreciated on # [id number] and and ill-defined mixed solid and cystic mass is difficult to exclude. Multiple additional simple cysts are identified within the left hepatic lobe. Portal vein is patent on color Doppler imaging with normal direction of blood flow towards the liver. Other: No ascites IMPRESSION: 1. Mild gallbladder wall thickening with trace pericholecystic fluid, nonspecific. No evidence for cholelithiasis or gallbladder sludge. 2. Intrahepatic biliary ductal dilation within the right hepatic lobe and biliary hilum. There is superimposed heterogeneity of the hepatic echotexture in this region and ill-defined mixed solid and cystic mass is difficult to exclude. Further evaluation with contrast-enhanced MRI is recommended. 3. Multiple additional simple cysts within the left hepatic lobe. Electronically Signed   By: Fidela Salisbury M.D.   On: 06/08/2022 18:17   CT ABDOMEN PELVIS W CONTRAST  Result Date: 06/08/2022 CLINICAL DATA:  Hematuria.  Hypotension.  Syncopal episode. EXAM: CT ABDOMEN AND PELVIS WITH CONTRAST TECHNIQUE: Multidetector CT imaging of the abdomen and pelvis was performed using the standard protocol following bolus administration of intravenous contrast. RADIATION DOSE REDUCTION: This exam was performed according to the departmental dose-optimization program which includes automated exposure control, adjustment  of the mA and/or kV according to patient size and/or use of iterative reconstruction technique. CONTRAST:  53m OMNIPAQUE IOHEXOL 300 MG/ML  SOLN COMPARISON:  None Available. FINDINGS: Lower Chest: No acute findings. Hepatobiliary: Multiple small benign-appearing hepatic cysts are noted. An ill-defined heterogeneous low-attenuation lesion is seen in the anterior segment the right hepatic lobe which measures approximately 9.1 x 6.4 cm on image 34/2. 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 No other suspicious hepatic masses are identified. Mild diffuse gallbladder wall thickening is seen, which could be secondary to acute pancreatitis although acute cholecystitis cannot definitely be excluded. Pancreas: Moderate acute pancreatitis is seen with diffuse peripancreatic inflammatory changes and small amount of fluid extending inferiorly in the right anterior pararenal space. No evidence of pancreatic necrosis or pseudocysts. No evidence of pancreatic mass. Spleen: Within normal limits in size and appearance. Adrenals/Urinary Tract: 2 small less than 1 cm right renal calculi are seen. No evidence of ureteral calculi or hydronephrosis. 5 cm benign-appearing left renal cyst noted (no followup imaging recommended). No masses identified. Stomach/Bowel: Small hiatal hernia noted. No evidence of obstruction, inflammatory process or abnormal fluid collections. Normal appendix visualized. Diverticulosis is seen mainly involving the descending and sigmoid colon, however there is no evidence of  diverticulitis. Vascular/Lymphatic: No pathologically enlarged lymph nodes. No acute vascular findings. Aortic atherosclerotic calcification incidentally noted. Reproductive: Brachytherapy seeds noted throughout the prostate gland. Otherwise unremarkable. Other:  None. Musculoskeletal:  No suspicious bone lesions identified. IMPRESSION: Moderate acute pancreatitis. No  evidence of pancreatic necrosis or pseudocysts. Mild diffuse gallbladder wall thickening likely secondary to acute pancreatitis, although acute cholecystitis cannot definitely be excluded. 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 Differential diagnosis includes neoplasm and abscess. Consider abdomen MRI without and with contrast for further characterization. Right nephrolithiasis. No evidence of ureteral calculi or hydronephrosis. Small hiatal hernia. Colonic diverticulosis, without radiographic evidence of diverticulitis. Aortic Atherosclerosis (ICD10-I70.0). Electronically Signed   By: Marlaine Hind M.D.   On: 06/08/2022 15:55   DG Chest Portable 1 View  Result Date: 06/08/2022 CLINICAL DATA:  Weakness.  Hypotension. EXAM: PORTABLE CHEST 1 VIEW COMPARISON:  August 25, 2018. FINDINGS: EKG leads project over the chest. Cardiomediastinal contours and hilar structures are stable accounting for AP projection and portable technique. No lobar consolidation.  No gross effusion on frontal radiograph. Linear areas of perihilar airspace disease in the RIGHT chest. On limited assessment there is no acute skeletal process. IMPRESSION: Linear areas of perihilar airspace disease in the RIGHT chest, likely atelectasis. Electronically Signed   By: Zetta Bills M.D.   On: 06/08/2022 14:07      Subjective: Patient seen and examined on day of transfer.  Endorsing some abdominal pain but hemodynamically stable.  Discharge Exam: Vitals:   06/09/22 1200 06/09/22 1232  BP: 131/78   Pulse: 71   Resp: 17   Temp:  98.8 F (37.1 C)  SpO2: 92%    Vitals:   06/09/22 0755 06/09/22 1000 06/09/22 1200 06/09/22 1232  BP:  (!) 141/89 131/78   Pulse:  73 71   Resp:  20 17   Temp: 98.6 F (37 C)   98.8 F (37.1 C)  TempSrc: Oral   Oral  SpO2:  (!) 89% 92%   Weight:      Height:        General: Pt is alert, awake, not  in acute distress Cardiovascular: RRR, S1/S2 +, no rubs, no gallops Respiratory: CTA bilaterally, no wheezing, no rhonchi Abdominal: Right lower quadrant tenderness.  Belly soft Extremities: no edema, no cyanosis    The results of significant diagnostics from this hospitalization (including imaging, microbiology, ancillary and laboratory) are listed below for reference.     Microbiology: Recent Results (from the past 240 hour(s))  Blood culture (routine x 2)     Status: None (Preliminary result)   Collection Time: 06/08/22  1:33 PM   Specimen: BLOOD  Result Value Ref Range Status   Specimen Description BLOOD LEFT HAND  Final   Special Requests   Final    BOTTLES DRAWN AEROBIC AND ANAEROBIC Blood Culture results may not be optimal due to an inadequate volume of blood received in culture bottles   Culture   Final    NO GROWTH < 24 HOURS Performed at [facility], [address], [place], [place] [address]    Report Status PENDING  Incomplete  Blood culture (routine x 2)     Status: None (Preliminary result)   Collection Time: 06/08/22  2:15 PM   Specimen: BLOOD  Result Value Ref Range Status   Specimen Description BLOOD BLOOD LEFT WRIST  Final   Special Requests   Final  BOTTLES DRAWN AEROBIC AND ANAEROBIC Blood Culture results may not be optimal due to an inadequate volume of blood received in culture bottles   Culture   Final    NO GROWTH < 24 HOURS Performed at [facility], [address] [place], [place] [address]    Report Status PENDING  Incomplete     Labs: BNP (last 3 results) No results for input(s): "BNP" in the last 8760 hours. Basic Metabolic Panel: Recent Labs  Lab 06/08/22 1201 06/08/22 1253 06/09/22 0415  NA 137  --  136  K HEMOLYZED SPECIMEN UNABLE TO OBTAIN RESULT 3.4* 4.8  CL 96*  --  100  CO2 25  --  26  GLUCOSE 160*  --  140*  BUN 22  --  38*  CREATININE 1.69*  --  1.96*  CALCIUM 9.6  --  8.2*   Liver Function  Tests: Recent Labs  Lab 06/08/22 1810 06/09/22 0415  AST 1,756* 1,895*  ALT 530* 621*  ALKPHOS 54 57  BILITOT 1.9* 1.4*  PROT 6.8 6.1*  ALBUMIN 3.6 3.4*   Recent Labs  Lab 06/08/22 1810 06/09/22 0415  LIPASE 1,633* 1,741*   No results for input(s): "AMMONIA" in the last 168 hours. CBC: Recent Labs  Lab 06/08/22 1057 06/09/22 0415  WBC 19.8* 11.4*  NEUTROABS 15.6* 9.7*  HGB 15.9 14.3  HCT 50.2 43.9  MCV 85.5 84.1  PLT 222 157   Cardiac Enzymes: No results for input(s): "CKTOTAL", "CKMB", "CKMBINDEX", "TROPONINI" in the last 168 hours. BNP: Invalid input(s): "POCBNP" CBG: Recent Labs  Lab 06/08/22 2355 06/09/22 0727 06/09/22 1110  GLUCAP 142* 127* 122*   D-Dimer No results for input(s): "DDIMER" in the last 72 hours. Hgb A1c No results for input(s): "HGBA1C" in the last 72 hours. Lipid Profile No results for input(s): "CHOL", "HDL", "LDLCALC", "TRIG", "CHOLHDL", "LDLDIRECT" in the last 72 hours. Thyroid function studies No results for input(s): "TSH", "T4TOTAL", "T3FREE", "THYROIDAB" in the last 72 hours.  Invalid input(s): "FREET3" Anemia work up No results for input(s): "VITAMINB12", "FOLATE", "FERRITIN", "TIBC", "IRON", "RETICCTPCT" in the last 72 hours. Urinalysis    Component Value Date/Time   COLORURINE AMBER (A) 06/08/2022 1811   APPEARANCEUR CLOUDY (A) 06/08/2022 1811   LABSPEC 1.015 06/08/2022 1811   PHURINE 6.0 06/08/2022 1811   GLUCOSEU 50 (A) 06/08/2022 1811   GLUCOSEU NEGATIVE 11/28/2015 0936   HGBUR LARGE (A) 06/08/2022 1811   BILIRUBINUR NEGATIVE 06/08/2022 1811   KETONESUR NEGATIVE 06/08/2022 1811   PROTEINUR 100 (A) 06/08/2022 1811   UROBILINOGEN 0.2 11/28/2015 0936   NITRITE NEGATIVE 06/08/2022 1811   LEUKOCYTESUR NEGATIVE 06/08/2022 1811   Sepsis Labs Recent Labs  Lab 06/08/22 1057 06/09/22 0415  WBC 19.8* 11.4*   Microbiology Recent Results (from the past 240 hour(s))  Blood culture (routine x 2)     Status: None  (Preliminary result)   Collection Time: 06/08/22  1:33 PM   Specimen: BLOOD  Result Value Ref Range Status   Specimen Description BLOOD LEFT HAND  Final   Special Requests   Final    BOTTLES DRAWN AEROBIC AND ANAEROBIC Blood Culture results may not be optimal due to an inadequate volume of blood received in culture bottles   Culture   Final    NO GROWTH < 24 HOURS Performed at [facility], [address] [place],  [address]    Report Status PENDING  Incomplete  Blood culture (routine x 2)     Status: None (Preliminary  result)   Collection Time: 06/08/22  2:15 PM   Specimen: BLOOD  Result Value Ref Range Status   Specimen Description BLOOD BLOOD LEFT WRIST  Final   Special Requests   Final    BOTTLES DRAWN AEROBIC AND ANAEROBIC Blood Culture results may not be optimal due to an inadequate volume of blood received in culture bottles   Culture   Final    NO GROWTH < 24 HOURS Performed at [facility], [address] [place], [place] [address]    Report Status PENDING  Incomplete     Time coordinating discharge: Over 30 minutes  SIGNED:   Sidney Ace, MD  [facility] 06/09/2022, 1:30 PM Pager   If 7PM-7AM, please contact night-coverage

## 2022-06-09 NOTE — ED Notes (Signed)
Informed RN bed assigned 

## 2022-06-09 NOTE — ED Notes (Signed)
Provider at bedside

## 2022-06-09 NOTE — Progress Notes (Signed)
Pharmacy Antibiotic Note  Xavier Esparza is a 76 y.o. male w/ PMH of  DM, BPH, prostate cancer, kidney stones s/p lithotripsyadmitted on 06/08/2022 with acute pancreatitis.  Pharmacy has been consulted for Zosyn dosing.  Plan: start Zosyn 3.375g IV q8h (4 hour infusion). --follow renal function for needed dose adjustments  Height: 6' (182.9 cm) Weight: 96.6 kg (213 lb) IBW/kg (Calculated) : 77.6  Temp (24hrs), Avg:98.1 F (36.7 C), Min:97.6 F (36.4 C), Max:98.6 F (37 C)  Recent Labs  Lab 06/08/22 1057 06/08/22 1201 06/08/22 1316 06/08/22 1708 06/09/22 0415  WBC 19.8*  --   --   --  11.4*  CREATININE  --  1.69*  --   --  1.96*  LATICACIDVEN  --   --  5.6* 5.5*  --     Estimated Creatinine Clearance: 38 mL/min (A) (by C-G formula based on SCr of 1.96 mg/dL (H)).    No Known Allergies  Antimicrobials this admission: 06/13 vancomycin x 1 06/13 cefepime >> 06/14 06/13 metronidazole >> 06/14 06/14 Zosyn >>   Microbiology results: 06/13 BCx: NGTD  Thank you for allowing pharmacy to be a part of this patient's care.  Dallie Piles 06/09/2022 8:20 AM

## 2022-06-09 NOTE — Progress Notes (Signed)
*  PRELIMINARY RESULTS* Echocardiogram 2D Echocardiogram has been performed.  Xavier Esparza 06/09/2022, 12:19 PM

## 2022-06-10 ENCOUNTER — Inpatient Hospital Stay: Payer: Medicare Other

## 2022-06-10 DIAGNOSIS — Z8546 Personal history of malignant neoplasm of prostate: Secondary | ICD-10-CM

## 2022-06-10 DIAGNOSIS — I81 Portal vein thrombosis: Secondary | ICD-10-CM

## 2022-06-10 DIAGNOSIS — K851 Biliary acute pancreatitis without necrosis or infection: Secondary | ICD-10-CM | POA: Diagnosis not present

## 2022-06-10 DIAGNOSIS — E119 Type 2 diabetes mellitus without complications: Secondary | ICD-10-CM

## 2022-06-10 DIAGNOSIS — K859 Acute pancreatitis without necrosis or infection, unspecified: Secondary | ICD-10-CM

## 2022-06-10 DIAGNOSIS — N179 Acute kidney failure, unspecified: Secondary | ICD-10-CM | POA: Diagnosis not present

## 2022-06-10 DIAGNOSIS — K8591 Acute pancreatitis with uninfected necrosis, unspecified: Secondary | ICD-10-CM

## 2022-06-10 DIAGNOSIS — R55 Syncope and collapse: Secondary | ICD-10-CM

## 2022-06-10 DIAGNOSIS — K8502 Idiopathic acute pancreatitis with infected necrosis: Secondary | ICD-10-CM

## 2022-06-10 DIAGNOSIS — R945 Abnormal results of liver function studies: Secondary | ICD-10-CM

## 2022-06-10 DIAGNOSIS — A419 Sepsis, unspecified organism: Secondary | ICD-10-CM | POA: Diagnosis not present

## 2022-06-10 DIAGNOSIS — E872 Acidosis, unspecified: Secondary | ICD-10-CM

## 2022-06-10 DIAGNOSIS — D72829 Elevated white blood cell count, unspecified: Secondary | ICD-10-CM

## 2022-06-10 LAB — COMPREHENSIVE METABOLIC PANEL
ALT: 374 U/L — ABNORMAL HIGH (ref 0–44)
AST: 593 U/L — ABNORMAL HIGH (ref 15–41)
Albumin: 2.8 g/dL — ABNORMAL LOW (ref 3.5–5.0)
Alkaline Phosphatase: 44 U/L (ref 38–126)
Anion gap: 10 (ref 5–15)
BUN: 50 mg/dL — ABNORMAL HIGH (ref 8–23)
CO2: 21 mmol/L — ABNORMAL LOW (ref 22–32)
Calcium: 5.7 mg/dL — CL (ref 8.9–10.3)
Chloride: 106 mmol/L (ref 98–111)
Creatinine, Ser: 3.95 mg/dL — ABNORMAL HIGH (ref 0.61–1.24)
GFR, Estimated: 15 mL/min — ABNORMAL LOW (ref >60)
Glucose, Bld: 126 mg/dL — ABNORMAL HIGH (ref 70–99)
Potassium: 5.1 mmol/L (ref 3.5–5.1)
Sodium: 137 mmol/L (ref 135–145)
Total Bilirubin: 1.7 mg/dL — ABNORMAL HIGH (ref 0.3–1.2)
Total Protein: 5.6 g/dL — ABNORMAL LOW (ref 6.5–8.1)

## 2022-06-10 LAB — HEPATITIS PANEL, ACUTE
HCV Ab: NONREACTIVE
Hep A IgM: NONREACTIVE
Hep B C IgM: NONREACTIVE
Hepatitis B Surface Ag: NONREACTIVE

## 2022-06-10 LAB — SARS CORONAVIRUS 2 BY RT PCR: SARS Coronavirus 2 by RT PCR: NEGATIVE

## 2022-06-10 LAB — LACTIC ACID, PLASMA
Lactic Acid, Venous: 2.9 mmol/L (ref 0.5–1.9)
Lactic Acid, Venous: 3.5 mmol/L (ref 0.5–1.9)
Lactic Acid, Venous: 4 mmol/L (ref 0.5–1.9)
Lactic Acid, Venous: 2.4 mmol/L (ref 0.5–1.9)

## 2022-06-10 LAB — HEPARIN LEVEL (UNFRACTIONATED): Heparin Unfractionated: 0.79 IU/mL — ABNORMAL HIGH (ref 0.30–0.70)

## 2022-06-10 LAB — BLOOD GAS, ARTERIAL
Acid-base deficit: 4 mmol/L — ABNORMAL HIGH (ref 0.0–2.0)
Bicarbonate: 19.6 mmol/L — ABNORMAL LOW (ref 20.0–28.0)
FIO2: 21 %
O2 Saturation: 94.9 %
Patient temperature: 37
pCO2 arterial: 31 mmHg — ABNORMAL LOW (ref 32–48)
pH, Arterial: 7.41 (ref 7.35–7.45)
pO2, Arterial: 65 mmHg — ABNORMAL LOW (ref 83–108)

## 2022-06-10 LAB — CBC
HCT: 48 % (ref 39.0–52.0)
HCT: 44.1 % (ref 39.0–52.0)
Hemoglobin: 15.4 g/dL (ref 13.0–17.0)
Hemoglobin: 14.3 g/dL (ref 13.0–17.0)
MCH: 26.9 pg (ref 26.0–34.0)
MCH: 27.2 pg (ref 26.0–34.0)
MCHC: 32.1 g/dL (ref 30.0–36.0)
MCHC: 32.4 g/dL (ref 30.0–36.0)
MCV: 83.8 fL (ref 80.0–100.0)
MCV: 84 fL (ref 80.0–100.0)
Platelets: 154 10*3/uL (ref 150–400)
Platelets: 167 10*3/uL (ref 150–400)
RBC: 5.73 MIL/uL (ref 4.22–5.81)
RBC: 5.25 MIL/uL (ref 4.22–5.81)
RDW: 17.6 % — ABNORMAL HIGH (ref 11.5–15.5)
RDW: 17.3 % — ABNORMAL HIGH (ref 11.5–15.5)
WBC: 12.6 10*3/uL — ABNORMAL HIGH (ref 4.0–10.5)
WBC: 14.2 10*3/uL — ABNORMAL HIGH (ref 4.0–10.5)
nRBC: 0 % (ref 0.0–0.2)
nRBC: 0 % (ref 0.0–0.2)

## 2022-06-10 LAB — CK: Total CK: 1611 U/L — ABNORMAL HIGH (ref 49–397)

## 2022-06-10 LAB — BASIC METABOLIC PANEL
Anion gap: 12 (ref 5–15)
BUN: 67 mg/dL — ABNORMAL HIGH (ref 8–23)
CO2: 23 mmol/L (ref 22–32)
Calcium: 5.6 mg/dL — CL (ref 8.9–10.3)
Chloride: 102 mmol/L (ref 98–111)
Creatinine, Ser: 5.29 mg/dL — ABNORMAL HIGH (ref 0.61–1.24)
GFR, Estimated: 11 mL/min — ABNORMAL LOW (ref >60)
Glucose, Bld: 131 mg/dL — ABNORMAL HIGH (ref 70–99)
Potassium: 4.8 mmol/L (ref 3.5–5.1)
Sodium: 137 mmol/L (ref 135–145)

## 2022-06-10 LAB — TRIGLYCERIDES: Triglycerides: 135 mg/dL (ref <150)

## 2022-06-10 LAB — GLUCOSE, CAPILLARY
Glucose-Capillary: 140 mg/dL — ABNORMAL HIGH (ref 70–99)
Glucose-Capillary: 136 mg/dL — ABNORMAL HIGH (ref 70–99)
Glucose-Capillary: 140 mg/dL — ABNORMAL HIGH (ref 70–99)

## 2022-06-10 LAB — PHOSPHORUS: Phosphorus: 3.4 mg/dL (ref 2.5–4.6)

## 2022-06-10 LAB — PROCALCITONIN: Procalcitonin: 19.36 ng/mL

## 2022-06-10 LAB — CBG MONITORING, ED
Glucose-Capillary: 143 mg/dL — ABNORMAL HIGH (ref 70–99)
Glucose-Capillary: 139 mg/dL — ABNORMAL HIGH (ref 70–99)

## 2022-06-10 LAB — LIPASE, BLOOD: Lipase: 688 U/L — ABNORMAL HIGH (ref 11–51)

## 2022-06-10 LAB — SEDIMENTATION RATE: Sed Rate: 18 mm/hr (ref 0–20)

## 2022-06-10 LAB — AMMONIA: Ammonia: 13 umol/L (ref 9–35)

## 2022-06-10 LAB — MRSA NEXT GEN BY PCR, NASAL: MRSA by PCR Next Gen: NOT DETECTED

## 2022-06-10 IMAGING — US US RENAL
1 series · 14 of 25 positions shown · non-contrast
Comparison: No prior ultrasound, correlation is made with CT
abdomen pelvis and MRI abdomen [DATE]

CLINICAL DATA: Acute kidney injury

EXAM:
RENAL / URINARY TRACT ULTRASOUND COMPLETE

[Series 1: us renal · 14 of 42 slices shown]
[im 1/42]
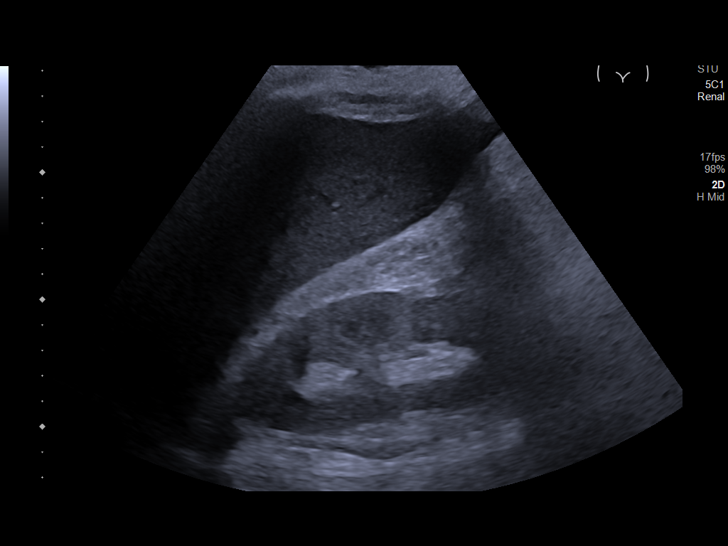
[im 4/42]
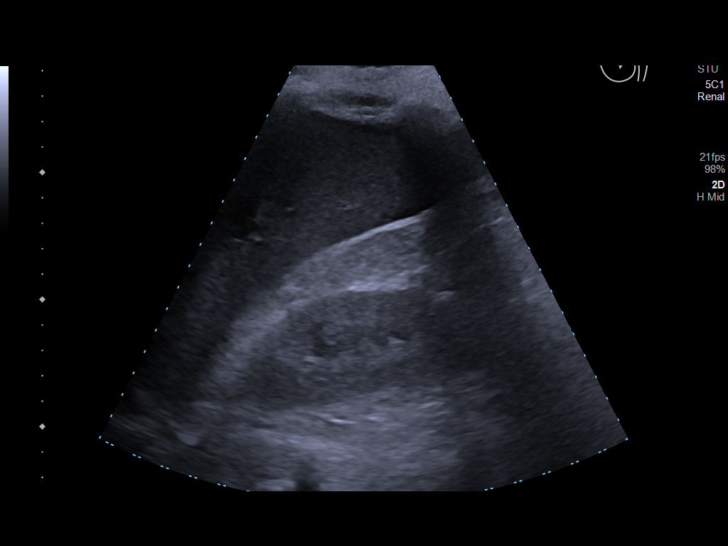
[im 7/42]
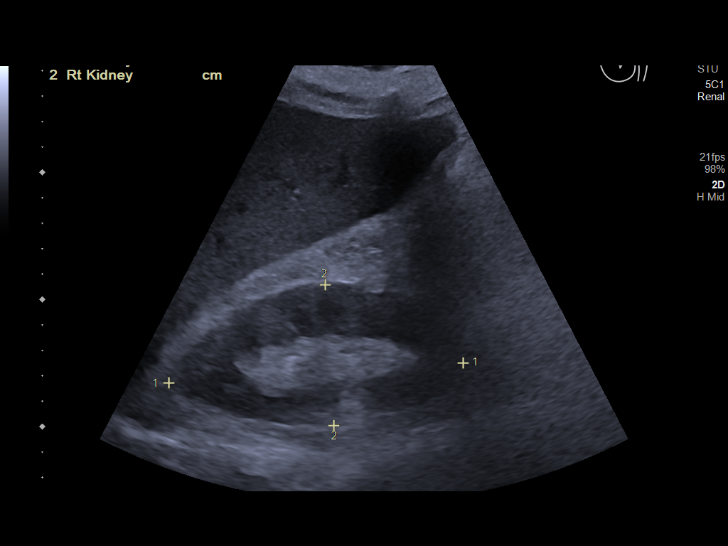
[im 11/42]
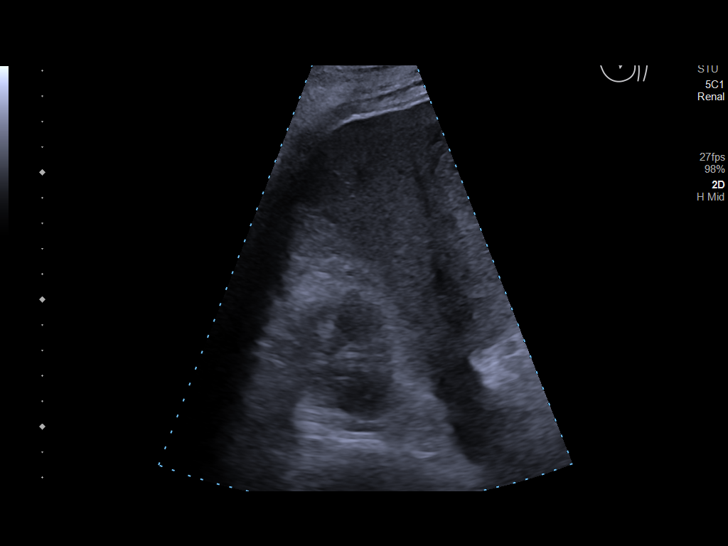
[im 14/42]
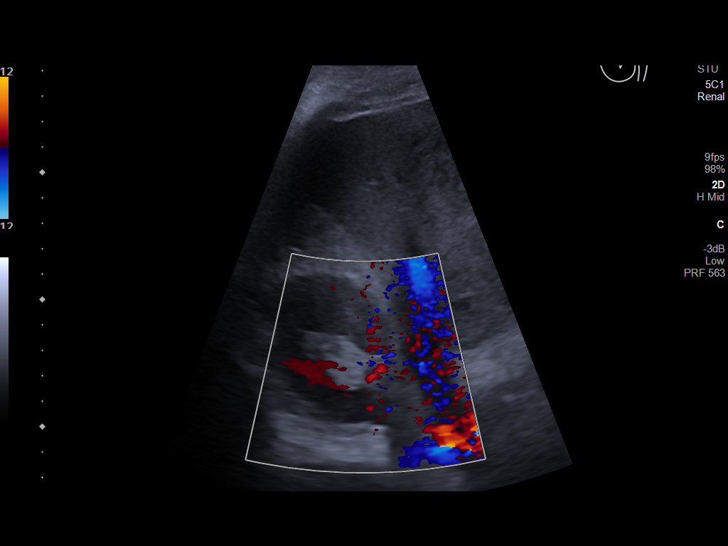
[im 16/42]
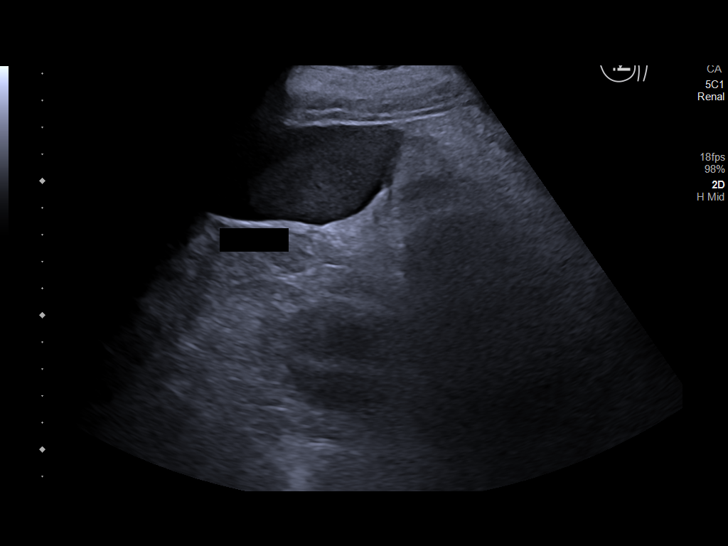
[im 19/42]
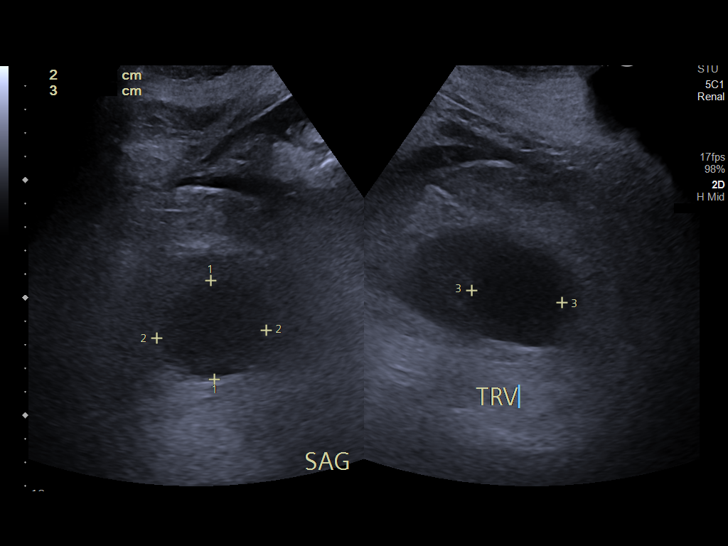
[im 23/42]
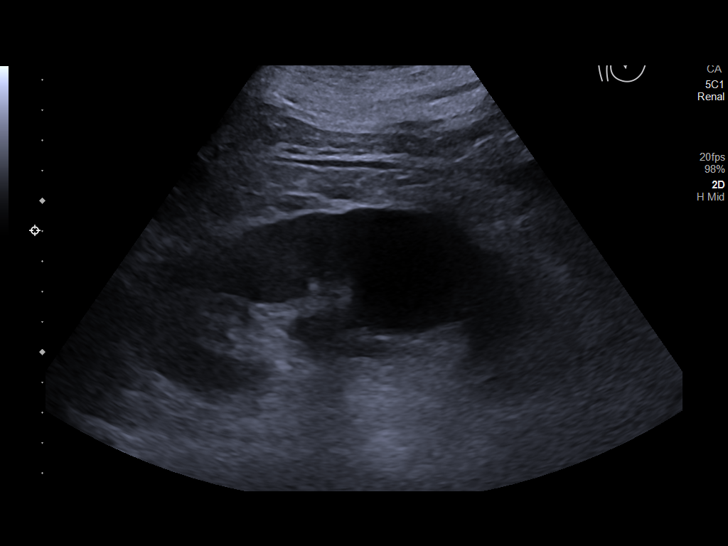
[im 26/42]
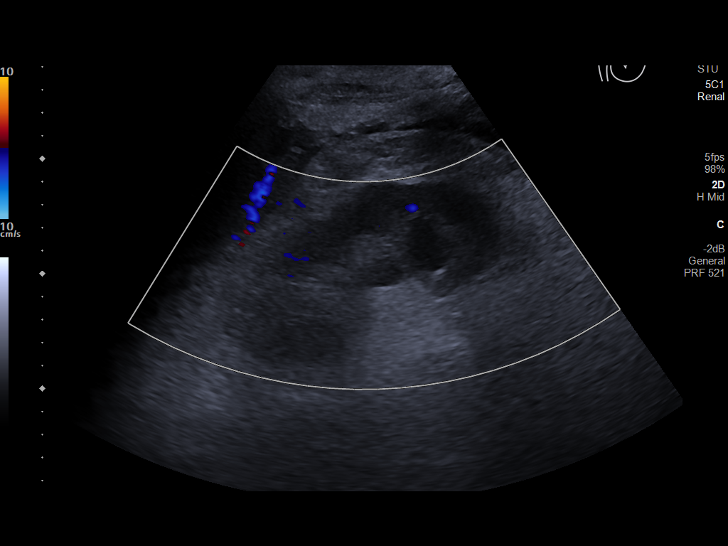
[im 28/42]
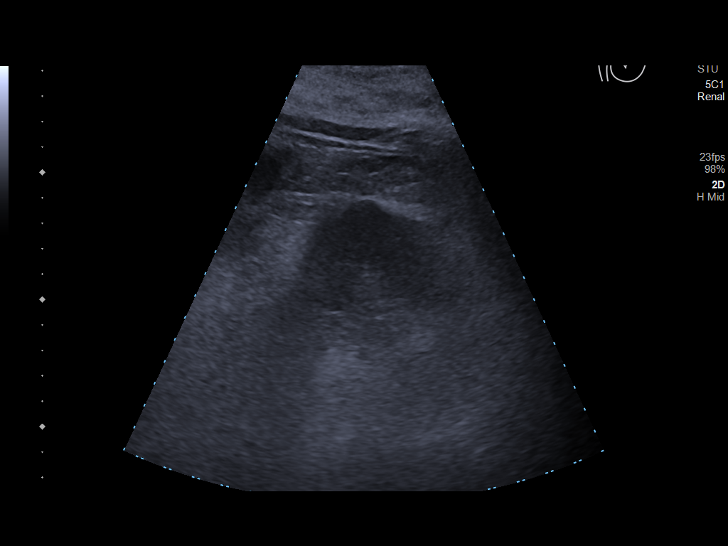
[im 31/42]
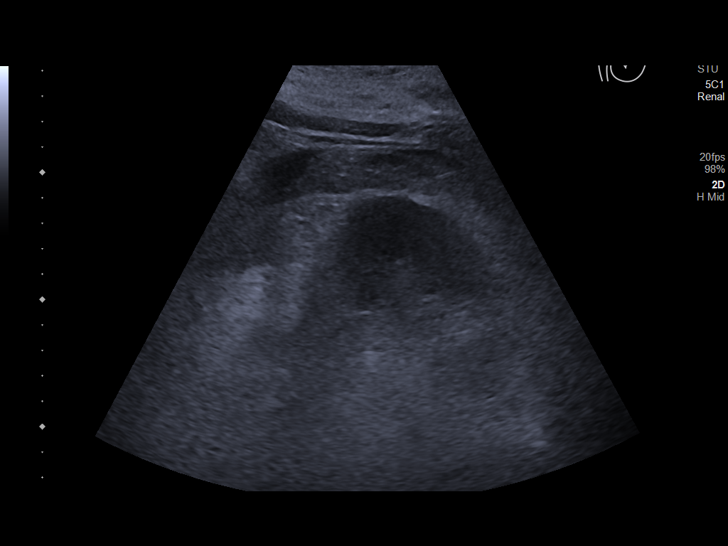
[im 35/42]
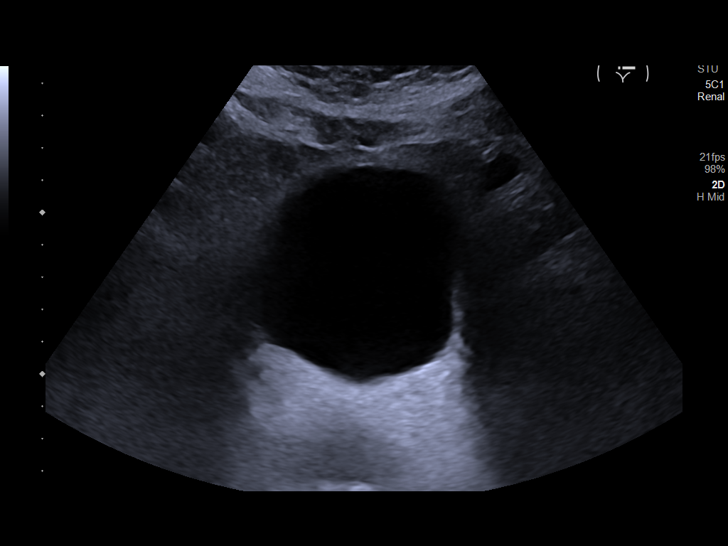
[im 38/42]
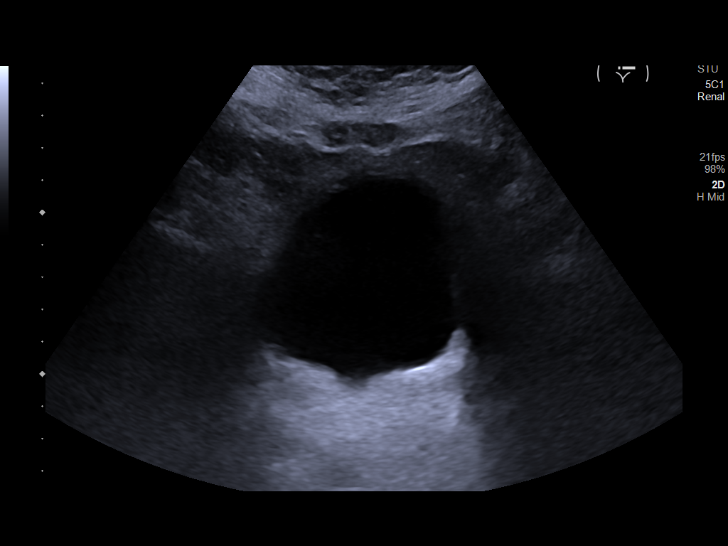
[im 42/42]
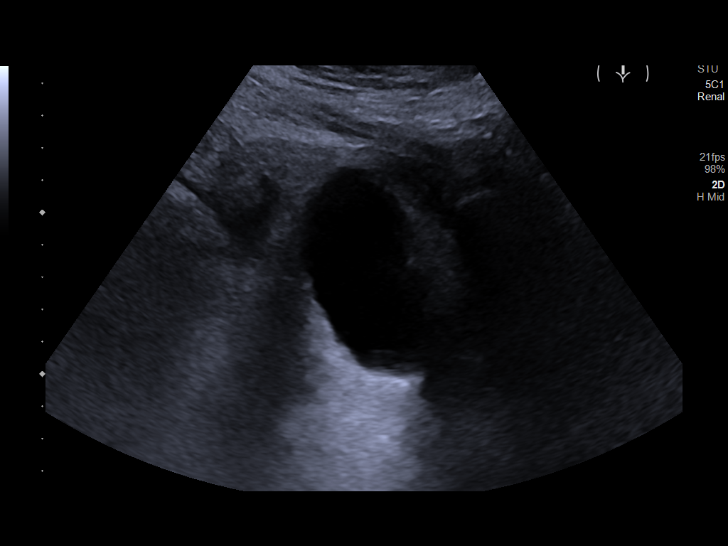

[14 of 25 positions shown; findings below may reference images not displayed]

FINDINGS: Right Kidney:

Renal measurements: 11.6 x 5.6 x 5.2 cm = volume: 172 mL.
Echogenicity within normal limits. No mass or hydronephrosis
visualized.

Left Kidney:

Renal measurements: 12.9 x 5.8 x 5.1 cm = volume: 201 mL.
Echogenicity within normal limits. No hydronephrosis visualized.
Cystic lesion in the mid left kidney measures up to 4.4 x 4.7 x
cm, consistent with a simple cyst, as seen on the [DATE] CT and
MRI

Bladder:

Appears normal for degree of bladder distention.

Other:

Small amount of ascites.
IMPRESSION: 1. No acute finding in the kidneys.
2. Small volume ascites.

## 2022-06-10 MED ORDER — PIPERACILLIN-TAZOBACTAM 3.375 G IVPB 30 MIN
3.3750 g | Freq: Three times a day (TID) | INTRAVENOUS | Status: DC
Start: 2022-06-10 — End: 2022-06-11
  Administered 2022-06-10 – 2022-06-11 (×2): 3.375 g via INTRAVENOUS
  Filled 2022-06-10 (×5): qty 50

## 2022-06-10 MED ORDER — HEPARIN (PORCINE) 25000 UT/250ML-% IV SOLN
1600.0000 [IU]/h | INTRAVENOUS | Status: DC
  Administered 2022-06-10: 1700 [IU]/h via INTRAVENOUS
  Filled 2022-06-10 (×2): qty 250

## 2022-06-10 MED ORDER — CALCIUM GLUCONATE-NACL 1-0.675 GM/50ML-% IV SOLN
1.0000 g | Freq: Once | INTRAVENOUS | Status: AC
Start: 2022-06-10 — End: 2022-06-10
  Administered 2022-06-10: 1000 mg via INTRAVENOUS
  Filled 2022-06-10: qty 50

## 2022-06-10 MED ORDER — LACTATED RINGERS IV SOLN: INTRAVENOUS | Status: DC

## 2022-06-10 MED ORDER — HYDROMORPHONE HCL 1 MG/ML IJ SOLN
1.0000 mg | INTRAMUSCULAR | Status: DC | PRN
  Administered 2022-06-10 – 2022-06-25 (×58): 1 mg via INTRAVENOUS
  Filled 2022-06-10 (×60): qty 1

## 2022-06-10 MED ORDER — METOCLOPRAMIDE HCL 5 MG/ML IJ SOLN
5.0000 mg | Freq: Three times a day (TID) | INTRAMUSCULAR | Status: DC
  Administered 2022-06-10 – 2022-06-11 (×2): 5 mg via INTRAVENOUS
  Filled 2022-06-10 (×2): qty 2

## 2022-06-10 MED ORDER — STERILE WATER FOR INJECTION IV SOLN
INTRAVENOUS | Status: DC
  Filled 2022-06-10: qty 150
  Filled 2022-06-10 (×3): qty 1000

## 2022-06-10 MED ORDER — HEPARIN BOLUS VIA INFUSION
6000.0000 [IU] | Freq: Once | INTRAVENOUS | Status: AC
  Administered 2022-06-10: 6000 [IU] via INTRAVENOUS
  Filled 2022-06-10: qty 6000

## 2022-06-10 MED ORDER — CHLORHEXIDINE GLUCONATE CLOTH 2 % EX PADS
6.0000 | MEDICATED_PAD | Freq: Every day | CUTANEOUS | Status: DC
  Administered 2022-06-10 – 2022-06-29 (×20): 6 via TOPICAL

## 2022-06-10 NOTE — ED Notes (Signed)
Provider made aware patient continuously refuses to have blood glucose checked and sliding scale insulin administered

## 2022-06-10 NOTE — ED Notes (Signed)
Called [facility] spoke to [place] @ [phone] to get an update on bed assignment @ this time still no bed available

## 2022-06-10 NOTE — Progress Notes (Signed)
Xavier Darby, MD [address]  [place]  [place], [place] [address]  Main: [phone]  Fax: [phone] Pager: [phone]   Subjective: Patient is transferred to ICU.  Transferred to [facility] because there was no evidence of choledocholithiasis on MRCP.  Patient continues to have abdominal pain.  His creatinine worsened compared to yesterday with decrease in urine output.  Patient denies any nausea or vomiting.    Objective: Vital signs in last 24 hours: Vitals:   06/10/22 1900 06/10/22 2000 06/10/22 2100 06/10/22 2200  BP: 109/69 1'07/64 94/64 91/77 '$  Pulse: (!) 104 95 97 (!) 105  Resp:      Temp:      TempSrc:      SpO2: 92% 95% 93% 96%  Weight:      Height:       Weight change:   Intake/Output Summary (Last 24 hours) at 06/10/2022 2241 Last data filed at 06/10/2022 2000 Gross per 24 hour  Intake 458.58 ml  Output 250 ml  Net 208.58 ml     Exam: Heart:: Tachycardia, regular rhythm Lungs: bibasilar rales Abdomen: Moderate upper abdominal tenderness, no rebound guarding or rigidity   Lab Results:    Latest Ref Rng & Units 06/10/2022    3:29 PM 06/10/2022    5:32 AM 06/09/2022    4:15 AM  CBC  WBC 4.0 - 10.5 K/uL 14.2  12.6  11.4   Hemoglobin 13.0 - 17.0 g/dL 14.3  15.4  14.3   Hematocrit 39.0 - 52.0 % 44.1  48.0  43.9   Platelets 150 - 400 K/uL 167  154  157       Latest Ref Rng & Units 06/10/2022    3:29 PM 06/10/2022    5:32 AM 06/09/2022    4:15 AM  CMP  Glucose 70 - 99 mg/dL 131  126  140   BUN 8 - 23 mg/dL 67  50  38   Creatinine 0.61 - 1.24 mg/dL 5.29  3.95  1.96   Sodium 135 - 145 mmol/L 137  137  136   Potassium 3.5 - 5.1 mmol/L 4.8  5.1  4.8   Chloride 98 - 111 mmol/L 102  106  100   CO2 22 - 32 mmol/L '23  21  26   '$ Calcium 8.9 - 10.3 mg/dL 5.6  5.7  8.2   Total Protein 6.5 - 8.1 g/dL  5.6  6.1   Total Bilirubin 0.3 - 1.2 mg/dL  1.7  1.4   Alkaline Phos 38 - 126 U/L  44  57   AST 15 - 41 U/L  593  1,895   ALT 0 -  44 U/L  374  621     Micro Results: Recent Results (from the past 240 hour(s))  Blood culture (routine x 2)     Status: None (Preliminary result)   Collection Time: 06/08/22  1:33 PM   Specimen: BLOOD  Result Value Ref Range Status   Specimen Description BLOOD LEFT HAND  Final   Special Requests   Final    BOTTLES DRAWN AEROBIC AND ANAEROBIC Blood Culture results may not be optimal due to an inadequate volume of blood received in culture bottles   Culture   Final    NO GROWTH 2 DAYS Performed at [facility], [address] [place], [place] [address]    Report Status PENDING  Incomplete  Blood culture (routine x 2)     Status: None (  Preliminary result)   Collection Time: 06/08/22  2:15 PM   Specimen: BLOOD  Result Value Ref Range Status   Specimen Description BLOOD BLOOD LEFT WRIST  Final   Special Requests   Final    BOTTLES DRAWN AEROBIC AND ANAEROBIC Blood Culture results may not be optimal due to an inadequate volume of blood received in culture bottles   Culture   Final    NO GROWTH 2 DAYS Performed at [facility], [address] [place], [place] [address]    Report Status PENDING  Incomplete  MRSA Next Gen by PCR, Nasal     Status: None   Collection Time: 06/10/22  4:14 PM   Specimen: Nasal Mucosa; Nasal Swab  Result Value Ref Range Status   MRSA by PCR Next Gen NOT DETECTED NOT DETECTED Final    Comment: (NOTE) The GeneXpert MRSA Assay (FDA approved for NASAL specimens only), is one component of a comprehensive MRSA colonization surveillance program. It is not intended to diagnose MRSA infection nor to guide or monitor treatment for MRSA infections. Test performance is not FDA approved in patients less than 76 years old. Performed at [facility], [address] [place], [place] [address]   SARS Coronavirus 2 by RT PCR (hospital order, performed in [facility] hospital lab) *cepheid single result test* Anterior Nasal Swab     Status:  None   Collection Time: 06/10/22  5:17 PM   Specimen: Anterior Nasal Swab  Result Value Ref Range Status   SARS Coronavirus 2 by RT PCR NEGATIVE NEGATIVE Final    Comment: (NOTE) SARS-CoV-2 target nucleic acids are NOT DETECTED.  The SARS-CoV-2 RNA is generally detectable in upper and lower respiratory specimens during the acute phase of infection. The lowest concentration of SARS-CoV-2 viral copies this assay can detect is 250 copies / mL. A negative result does not preclude SARS-CoV-2 infection and should not be used as the sole basis for treatment or other patient management decisions.  A negative result may occur with improper specimen collection / handling, submission of specimen other than nasopharyngeal swab, presence of viral mutation(s) within the areas targeted by this assay, and inadequate number of viral copies (<250 copies / mL). A negative result must be combined with clinical observations, patient history, and epidemiological information.  Fact Sheet for Patients:   https://www.patel.info/  Fact Sheet for Healthcare Providers: https://hall.com/  This test is not yet approved or  cleared by the [place] FDA and has been authorized for detection and/or diagnosis of SARS-CoV-2 by FDA under an Emergency Use Authorization (EUA).  This EUA will remain in effect (meaning this test can be used) for the duration of the COVID-19 declaration under Section 564(b)(1) of the Act, 21 U.S.C. section 360bbb-3(b)(1), unless the authorization is terminated or revoked sooner.  Performed at [facility], [address] [place], [place] [address]    Studies/Results: US RENAL  Result Date: 06/10/2022 CLINICAL DATA:  Acute kidney injury EXAM: RENAL / URINARY TRACT ULTRASOUND COMPLETE COMPARISON:  No prior ultrasound, correlation is made with CT abdomen pelvis and MRI abdomen 06/08/2022 FINDINGS: Right Kidney: Renal  measurements: 11.6 x 5.6 x 5.2 cm = volume: 172 mL. Echogenicity within normal limits. No mass or hydronephrosis visualized. Left Kidney: Renal measurements: 12.9 x 5.8 x 5.1 cm = volume: 201 mL. Echogenicity within normal limits. No hydronephrosis visualized. Cystic lesion in the mid left kidney measures up to 4.4 x 4.7 x 5.1 cm, consistent with a simple cyst,  as seen on the 06/08/2022 CT and MRI Bladder: Appears normal for degree of bladder distention. Other: Small amount of ascites. IMPRESSION: 1. No acute finding in the kidneys. 2. Small volume ascites. Electronically Signed   By: Merilyn Baba M.D.   On: 06/10/2022 12:21   ECHOCARDIOGRAM COMPLETE  Result Date: 06/09/2022    ECHOCARDIOGRAM REPORT   Patient Name:   Xavier Esparza Date of Exam: 06/09/2022 Medical Rec #:  [id number]    Height:       72.0 in Accession #:    [id number]   Weight:       213.0 lb Date of Birth:  Jan 15, 1946    BSA:          2.188 m Patient Age:    76 years     BP:           139/86 mmHg Patient Gender: M            HR:           74 bpm. Exam Location:  [facility] Procedure: 2D Echo, Color Doppler and Cardiac Doppler Indications:     R55 Syncope  History:         Patient has no prior history of Echocardiogram examinations.                  Risk Factors:Diabetes and Dyslipidemia.  Sonographer:     Charmayne Sheer Referring Phys:  [id number] Athena Masse Diagnosing Phys: Kate Sable MD  Sonographer Comments: Suboptimal apical window and suboptimal subcostal window. IMPRESSIONS  1. Left ventricular ejection fraction, by estimation, is 60 to 65%. The left ventricle has normal function. The left ventricle has no regional wall motion abnormalities. There is mild left ventricular hypertrophy. Left ventricular diastolic parameters are consistent with Grade I diastolic dysfunction (impaired relaxation).  2. Right ventricular systolic function is normal. The right ventricular size is normal.  3. The mitral valve is normal in structure. Mild mitral valve  regurgitation.  4. The aortic valve is tricuspid. Aortic valve regurgitation is not visualized. Aortic valve sclerosis is present, with no evidence of aortic valve stenosis.  5. Aortic dilatation noted. There is borderline dilatation of the aortic root, measuring 38 mm.  6. The inferior vena cava is normal in size with greater than 50% respiratory variability, suggesting right atrial pressure of 3 mmHg. FINDINGS  Left Ventricle: Left ventricular ejection fraction, by estimation, is 60 to 65%. The left ventricle has normal function. The left ventricle has no regional wall motion abnormalities. The left ventricular internal cavity size was normal in size. There is  mild left ventricular hypertrophy. Left ventricular diastolic parameters are consistent with Grade I diastolic dysfunction (impaired relaxation). Right Ventricle: The right ventricular size is normal. No increase in right ventricular wall thickness. Right ventricular systolic function is normal. Left Atrium: Left atrial size was normal in size. Right Atrium: Right atrial size was normal in size. Pericardium: There is no evidence of pericardial effusion. Mitral Valve: The mitral valve is normal in structure. Mild mitral valve regurgitation. MV peak gradient, 4.0 mmHg. The mean mitral valve gradient is 1.0 mmHg. Tricuspid Valve: The tricuspid valve is normal in structure. Tricuspid valve regurgitation is trivial. Aortic Valve: The aortic valve is tricuspid. Aortic valve regurgitation is not visualized. Aortic valve sclerosis is present, with no evidence of aortic valve stenosis. Aortic valve mean gradient measures 3.0 mmHg. Aortic valve peak gradient measures 7.2  mmHg. Aortic valve area, by VTI measures 2.69 cm. Pulmonic  Valve: The pulmonic valve was not well visualized. Pulmonic valve regurgitation is not visualized. Aorta: Aortic dilatation noted. There is borderline dilatation of the aortic root, measuring 38 mm. Venous: The inferior vena cava is normal  in size with greater than 50% respiratory variability, suggesting right atrial pressure of 3 mmHg. IAS/Shunts: No atrial level shunt detected by color flow Doppler.  LEFT VENTRICLE PLAX 2D LVIDd:         4.48 cm   Diastology LVIDs:         3.25 cm   LV e' medial:    6.42 cm/s LV PW:         1.07 cm   LV E/e' medial:  6.8 LV IVS:        1.01 cm   LV e' lateral:   7.83 cm/s LVOT diam:     2.20 cm   LV E/e' lateral: 5.6 LV SV:         53 LV SV Index:   24 LVOT Area:     3.80 cm  RIGHT VENTRICLE RV Basal diam:  2.30 cm LEFT ATRIUM             Index        RIGHT ATRIUM           Index LA diam:        3.90 cm 1.78 cm/m   RA Area:     11.40 cm LA Vol (A2C):   31.8 ml 14.53 ml/m  RA Volume:   21.60 ml  9.87 ml/m LA Vol (A4C):   30.7 ml 14.03 ml/m LA Biplane Vol: 32.9 ml 15.03 ml/m  AORTIC VALVE                    PULMONIC VALVE AV Area (Vmax):    2.74 cm     PV Vmax:       0.98 m/s AV Area (Vmean):   2.84 cm     PV Vmean:      70.200 cm/s AV Area (VTI):     2.69 cm     PV VTI:        0.136 m AV Vmax:           134.00 cm/s  PV Peak grad:  3.9 mmHg AV Vmean:          84.600 cm/s  PV Mean grad:  2.0 mmHg AV VTI:            0.198 m AV Peak Grad:      7.2 mmHg AV Mean Grad:      3.0 mmHg LVOT Vmax:         96.60 cm/s LVOT Vmean:        63.300 cm/s LVOT VTI:          0.140 m LVOT/AV VTI ratio: 0.71  AORTA Ao Root diam: 3.80 cm MITRAL VALVE MV Area (PHT): 2.25 cm    SHUNTS MV Area VTI:   2.66 cm    Systemic VTI:  0.14 m MV Peak grad:  4.0 mmHg    Systemic Diam: 2.20 cm MV Mean grad:  1.0 mmHg MV Vmax:       1.00 m/s MV Vmean:      52.6 cm/s MV Decel Time: 337 msec MV E velocity: 43.70 cm/s MV A velocity: 97.30 cm/s MV E/A ratio:  0.45 Kate Sable MD Electronically signed by Kate Sable MD Signature Date/Time: 06/09/2022/6:43:28 PM    Final    MR  ABDOMEN MRCP WO CONTRAST  Result Date: 06/09/2022 CLINICAL DATA:  A 76 year old male presents for follow-up of imaging that was acquired earlier on the same date  to assess the biliary tree in the setting of acute necrotic pancreatitis. Please see earlier MRI for further details regarding pancreatitis and associated findings. EXAM: MRI ABDOMEN WITHOUT CONTRAST  (INCLUDING MRCP) TECHNIQUE: Multiplanar multisequence MR imaging of the abdomen was performed. Heavily T2-weighted images of the biliary and pancreatic ducts were obtained, and three-dimensional MRCP images were rendered by post processing. COMPARISON:  MRI of the same date for which this study was done to complete the evaluation of the biliary tree. FINDINGS: Lower chest: Effusions partially visualized in the lower chest. Not well assessed. Hepatobiliary: Hepatic findings with hepatic lesions and portal venous thrombosis as well as hepatic cysts better outlined on previous MRI evaluation. There is sludge in the gallbladder. There is gallbladder wall edema as outlined on previous imaging. There is no discrete evidence of choledocholithiasis. The biliary tree is nondilated. The pancreatic duct is nondilated. Pancreas: Pancreatic edema as seen on recent imaging better displayed on recent MR evaluation performed earlier the same day. Spleen:  Not well evaluated better evaluated on previous imaging. Adrenals/Urinary Tract: Similarly with limited assessment on this dedicated MRCP. Stomach/Bowel: Edema of the stomach and adjacent small bowel also better displayed on recent dedicated abdominal MRI. Vascular/Lymphatic:  Not well evaluated Other: Extensive edema throughout the upper abdomen with signs of perihepatic ascites. Musculoskeletal: Not well evaluated IMPRESSION: No signs of choledocholithiasis without dilation of the biliary tree. See dedicated abdominal MRI for further details regarding liver and pancreas as well as associated findings of acute necrotic pancreatitis. Electronically Signed   By: Zetta Bills M.D.   On: 06/09/2022 15:54   MR 3D Recon At Scanner  Result Date: 06/09/2022 CLINICAL DATA:  A  76 year old male presents for follow-up of imaging that was acquired earlier on the same date to assess the biliary tree in the setting of acute necrotic pancreatitis. Please see earlier MRI for further details regarding pancreatitis and associated findings. EXAM: MRI ABDOMEN WITHOUT CONTRAST  (INCLUDING MRCP) TECHNIQUE: Multiplanar multisequence MR imaging of the abdomen was performed. Heavily T2-weighted images of the biliary and pancreatic ducts were obtained, and three-dimensional MRCP images were rendered by post processing. COMPARISON:  MRI of the same date for which this study was done to complete the evaluation of the biliary tree. FINDINGS: Lower chest: Effusions partially visualized in the lower chest. Not well assessed. Hepatobiliary: Hepatic findings with hepatic lesions and portal venous thrombosis as well as hepatic cysts better outlined on previous MRI evaluation. There is sludge in the gallbladder. There is gallbladder wall edema as outlined on previous imaging. There is no discrete evidence of choledocholithiasis. The biliary tree is nondilated. The pancreatic duct is nondilated. Pancreas: Pancreatic edema as seen on recent imaging better displayed on recent MR evaluation performed earlier the same day. Spleen:  Not well evaluated better evaluated on previous imaging. Adrenals/Urinary Tract: Similarly with limited assessment on this dedicated MRCP. Stomach/Bowel: Edema of the stomach and adjacent small bowel also better displayed on recent dedicated abdominal MRI. Vascular/Lymphatic:  Not well evaluated Other: Extensive edema throughout the upper abdomen with signs of perihepatic ascites. Musculoskeletal: Not well evaluated IMPRESSION: No signs of choledocholithiasis without dilation of the biliary tree. See dedicated abdominal MRI for further details regarding liver and pancreas as well as associated findings of acute necrotic pancreatitis. Electronically Signed   By: Jewel Baize.D.  On:  06/09/2022 15:54   NM Pulmonary Perfusion  Result Date: 06/09/2022 CLINICAL DATA:  Chest pain EXAM: NUCLEAR MEDICINE PERFUSION LUNG SCAN TECHNIQUE: Perfusion images were obtained in multiple projections after intravenous injection of radiopharmaceutical. Ventilation scans intentionally deferred if perfusion scan and chest x-ray adequate for interpretation during COVID 19 epidemic. RADIOPHARMACEUTICALS:  3.96 mCi Tc-110mMAA IV COMPARISON:  Chest x-ray dated June 08, 2022 FINDINGS: No large segmental perfusion defects. Small subsegmental perfusion defects seen in the right lung. IMPRESSION: Nondiagnostic (low or intermediate probability) Electronically Signed   By: LYetta GlassmanM.D.   On: 06/09/2022 14:25   Medications: I have reviewed the patient's current medications. Prior to Admission:  Medications Prior to Admission  Medication Sig Dispense Refill Last Dose   acyclovir (ZOVIRAX) 800 MG tablet TAKE 1 TABLET DAILY 90 tablet 1 06/08/2022 at 0800   aspirin EC 81 MG tablet Take 81 mg by mouth daily. Swallow whole.   06/08/2022 at 0800   Calcium Carbonate (CALCIUM 600 PO) Take 2 tablets by mouth daily.   06/08/2022 at 0800   GLUCOSAMINE HCL PO Take 1 tablet by mouth daily.    06/08/2022 at 0800   losartan (COZAAR) 25 MG tablet Take 1 tablet (25 mg total) by mouth daily. 90 tablet 0 06/08/2022 at 0800   Magnesium 500 MG TABS Take 1 tablet by mouth daily.   06/08/2022 at 0800   metFORMIN (GLUCOPHAGE) 500 MG tablet TAKE 1 TABLET DAILY 90 tablet 1 06/08/2022 at 0800   Multiple Vitamin (MULTIVITAMIN) tablet Take 1 tablet by mouth daily. Reported on 01/07/2016   06/08/2022 at 0800   Omega-3 Fatty Acids ([employer] FISH OIL PO) Take 1,000 mg by mouth daily.   06/08/2022 at 0800   Red Yeast Rice 600 MG TABS Take 2 tablets (1,200 mg total) by mouth daily.   06/08/2022 at 0800   rosuvastatin (CRESTOR) 20 MG tablet TAKE 1 TABLET EVERY OTHER  DAY IN THE MORNING. NEEDS TO CALL OFFICE TO SCHEDULE YEARLY EXAM 45 tablet 0  06/08/2022 at 0800   Testosterone 1.62 % GEL Apply 2 Pump topically daily.   06/08/2022 at 0800   Scheduled:  [START ON 06/11/2022] Chlorhexidine Gluconate Cloth  6 each Topical Q0600   insulin aspart  0-15 Units Subcutaneous Q4H   metoCLOPramide (REGLAN) injection  5 mg Intravenous Q8H   Continuous:  heparin 1,700 Units/hr (06/10/22 2000)   piperacillin-tazobactam 3.375 g (06/10/22 2136)   sodium bicarbonate 150 mEq in sterile water 1,150 mL infusion 100 mL/hr at 06/10/22 2000   PEGB:[id number]**OR** acetaminophen, HYDROmorphone (DILAUDID) injection, oxyCODONE, prochlorperazine Anti-infectives (From admission, onward)    Start     Dose/Rate Route Frequency Ordered Stop   06/10/22 2200  piperacillin-tazobactam (ZOSYN) IVPB 3.375 g        3.375 g 100 mL/hr over 30 Minutes Intravenous Every 8 hours 06/10/22 2019     06/09/22 0900  piperacillin-tazobactam (ZOSYN) IVPB 3.375 g        3.375 g 12.5 mL/hr over 240 Minutes Intravenous Every 8 hours 06/09/22 0826 06/10/22 1830   06/09/22 0000  ceFEPIme (MAXIPIME) 2 g in sodium chloride 0.9 % 100 mL IVPB        2 g 200 mL/hr over 30 Minutes Intravenous  Once 06/08/22 2221 06/09/22 0051   06/08/22 2300  metroNIDAZOLE (FLAGYL) IVPB 500 mg  Status:  Discontinued        500 mg 100 mL/hr over 60 Minutes Intravenous Every 12 hours 06/08/22 2221 06/09/22 06269  06/08/22 1345  ceFEPIme (MAXIPIME) 2 g in sodium chloride 0.9 % 100 mL IVPB        2 g 200 mL/hr over 30 Minutes Intravenous  Once 06/08/22 1343 06/08/22 1449   06/08/22 1345  vancomycin (VANCOCIN) IVPB 1000 mg/200 mL premix        1,000 mg 200 mL/hr over 60 Minutes Intravenous  Once 06/08/22 1343 06/08/22 1517   06/08/22 1345  metroNIDAZOLE (FLAGYL) IVPB 500 mg        500 mg 100 mL/hr over 60 Minutes Intravenous  Once 06/08/22 1343 06/08/22 1623      Scheduled Meds:  [START ON 06/11/2022] Chlorhexidine Gluconate Cloth  6 each Topical Q0600   insulin aspart  0-15 Units Subcutaneous  Q4H   metoCLOPramide (REGLAN) injection  5 mg Intravenous Q8H   Continuous Infusions:  heparin 1,700 Units/hr (06/10/22 2000)   piperacillin-tazobactam 3.375 g (06/10/22 2136)   sodium bicarbonate 150 mEq in sterile water 1,150 mL infusion 100 mL/hr at 06/10/22 2000   PRN Meds:.acetaminophen **OR** acetaminophen, HYDROmorphone (DILAUDID) injection, oxyCODONE, prochlorperazine   Assessment: Principal Problem:   Severe sepsis (HCC) Active Problems:   Diabetes mellitus without complication (HCC)   BPH (benign prostatic hyperplasia)   Nephrolithiasis   Acute pancreatitis   Liver mass, right lobe, abscess vs mass   Portal vein thrombosis   AKI (acute kidney injury) ([place])   Syncope   History of prostate cancer   HTN (hypertension)   Necrotizing pancreatitis 76 year old male with no significant past medical history, history of prostate cancer in remission, history of nephrolithiasis s/p lithotripsy is admitted with acute necrotizing pancreatitis  Plan: Acute necrotizing pancreatitis of unclear etiology except for MRCP showing gallbladder sludge evidence of choledocholithiasis ?  Levaquin induced acute pancreatitis Continue maintenance IV fluids Maintain strict n.p.o. Recommend to start tube feeds for enteral nutrition If patient's condition deteriorates, recommend repeat CT scan and recommend transfer to tertiary care center Continue therapeutic anticoagulation for acute DVT Continue supportive care at this time  Hepatic microabscesses Currently on Zosyn Infectious disease has been consulted  AKI: Worsening renal function In setting of acute necrotizing pancreatitis + ?  Contrast-induced nephropathy Nephrology has been consulted  Overall prognosis is guarded due to progression to multiorgan failure.  No further recommendations from GI standpoint at this time   LOS: 2 days   Myiah Petkus 06/10/2022, 10:41 PM

## 2022-06-10 NOTE — Plan of Care (Signed)
Dr. Informed of critical Calcium 5.7.  Re-draw sent per lab request

## 2022-06-10 NOTE — Progress Notes (Signed)
[place] for heparin infusion initiation & monitoring Indication: portal vein thrombosis  No Known Allergies  Patient Measurements: Height: 6' (182.9 cm) Weight: 96.6 kg (213 lb) IBW/kg (Calculated) : 77.6 Heparin Dosing Weight: 96.6 kg  Vital Signs: Temp: 98.8 F (37.1 C) (06/15 0755) Temp Source: Oral (06/15 0755) BP: 100/72 (06/15 0755) Pulse Rate: 103 (06/15 0701)  Labs: Recent Labs    06/08/22 1057 06/08/22 1201 06/08/22 1253 06/08/22 2028 06/09/22 0415 06/10/22 0532  HGB 15.9  --   --   --  14.3 15.4  HCT 50.2  --   --   --  43.9 48.0  PLT 222  --   --   --  157 154  LABPROT  --   --   --  14.5  --   --   INR  --   --   --  1.1  --   --   CREATININE  --  1.69*  --   --  1.96* 3.95*  TROPONINIHS  --  15 16  --   --   --     Estimated Creatinine Clearance: 18.9 mL/min (A) (by C-G formula based on SCr of 3.95 mg/dL (H)).   Medical History: Past Medical History:  Diagnosis Date   Adopted    Cataract    resolved with surgery   Diabetes mellitus    Dyslipidemia    Erectile dysfunction    Herpes labialis    Upper Lip   Hyperlipidemia    Male hypogonadism    Prostate cancer ([place])    Renal stones    Thyroid nodule    Right    Medications:  Scheduled:   insulin aspart  0-15 Units Subcutaneous Q4H   metoCLOPramide (REGLAN) injection  10 mg Intravenous Q8H    Assessment: 76 y.o. male w/ PMH of  DM, BPH, prostate cancer, kidney stones s/p lithotripsy admitted on 06/08/2022 with acute pancreatitis and portal vein thrombosis. No chronic anticoagulation prior to arrival. H&H, platelets stable at baseline, INR 1.1  Goal of Therapy:  Heparin level 0.3-0.7 units/ml Monitor platelets by anticoagulation protocol: Yes   Plan:  Give 6000 units bolus x 1 Start heparin infusion at 1700 units/hr Check anti-Xa level in 8 hours and daily while on heparin Continue to monitor H&H and platelets  Dallie Piles 06/10/2022,12:01  PM

## 2022-06-10 NOTE — Consult Note (Signed)
[facility]  CONSULT NOTE    Date: 06/10/2022                  Patient Name:  Xavier Esparza  MRN: [id number]  DOB: 1946-09-03  Age / Sex: 76 y.o., male         PCP: Tonia Ghent, MD                 Service Requesting Consult: [facility]                 Reason for Consult: Acute kidney disease            History of Present Illness: Xavier Esparza is a 76 y.o.  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  Patient seen resting in bed, wife at bedside.  Wife states patient was getting up to use the restroom for the morning and she heard a thud.  She found patient lying in floor and unable to get himself up.  She called 911.  Patient and wife state no previous illness.  States patient did vomit after fall.  Reports normal appetite prior.  Denies chest pain or cough.  Denies known fever or chills.  Currently not experiencing nausea or vomiting.  Denies diarrhea or frequent urination.  Patient states he has frequent kidney stones and had noticed small amounts of blood in his urine about 2 weeks ago.  Recently visited urologist and received prescription for Levaquin.  Patient denies NSAID use.  Patient denies family history of personal kidney dysfunction.  Initial labs on ED arrival include potassium 3.4, glucose 160, creatinine 1.69 with GFR 41, WBCs 19.8, and lactic acid 5.6.Elevated liver enzymes of lipase 1633, AST 1756 and ALT 530.  Total bilirubin 1.9.  MRI abdomen shows acute necrotic and edematous pancreatitis, gallbladder edema.  MRCP shows no sign of choledocholithiasis without dilation of biliary tree.  Renal ultrasound negative for obstruction.  Liver and labs have improved slightly since admission with lipase  688, AST 593 and ALT 374 with total bilirubin 1.7.  However renal function has declined to BUN 50, creatinine 3.95 with GFR 15.   Medications: Outpatient medications: (Not in a hospital admission)   Current medications: Current Facility-Administered Medications  Medication Dose Route Frequency Provider Last Rate Last Admin   acetaminophen (TYLENOL) tablet 650 mg  650 mg Oral Q6H PRN Athena Masse, MD       Or   acetaminophen (TYLENOL) suppository 650 mg  650 mg Rectal Q6H PRN Athena Masse, MD       heparin ADULT infusion 100 units/mL (25000 units/2108m)  1,700 Units/hr Intravenous Continuous GDallie Piles RPH 17 mL/hr at 06/10/22 1343 1,700 Units/hr at 06/10/22 1343   HYDROmorphone (DILAUDID) injection 1 mg  1 mg Intravenous Q3H PRN SRalene MuskratB, MD   1 mg at 06/10/22 1345   insulin aspart (novoLOG) injection 0-15 Units  0-15 Units Subcutaneous Q4H DAthena Masse MD   2 Units at 06/10/22 1200   lactated ringers infusion   Intravenous Continuous SRalene MuskratB, MD 150 mL/hr at 06/10/22 1235 New Bag at 06/10/22 1235   metoCLOPramide (REGLAN) injection 10 mg  10 mg Intravenous Q8H SRalene MuskratB, MD   10 mg at 06/10/22  1431   oxyCODONE (Oxy IR/ROXICODONE) immediate release tablet 5 mg  5 mg Oral Q4H PRN Ralene Muskrat B, MD   5 mg at 06/09/22 0844   piperacillin-tazobactam (ZOSYN) IVPB 3.375 g  3.375 g Intravenous Q8H Dallie Piles, RPH 12.5 mL/hr at 06/10/22 1431 3.375 g at 06/10/22 1431   prochlorperazine (COMPAZINE) injection 10 mg  10 mg Intravenous Q6H PRN Ralene Muskrat B, MD   10 mg at 06/10/22 1526   Current Outpatient Medications  Medication Sig Dispense Refill   acyclovir (ZOVIRAX) 800 MG tablet TAKE 1 TABLET DAILY 90 tablet 1   aspirin EC 81 MG tablet Take 81 mg by mouth daily. Swallow whole.     Calcium Carbonate (CALCIUM 600 PO) Take 2 tablets by mouth daily.     GLUCOSAMINE HCL PO Take 1 tablet by mouth daily.      losartan (COZAAR) 25 MG  tablet Take 1 tablet (25 mg total) by mouth daily. 90 tablet 0   Magnesium 500 MG TABS Take 1 tablet by mouth daily.     metFORMIN (GLUCOPHAGE) 500 MG tablet TAKE 1 TABLET DAILY 90 tablet 1   Multiple Vitamin (MULTIVITAMIN) tablet Take 1 tablet by mouth daily. Reported on 01/07/2016     Omega-3 Fatty Acids ([employer] FISH OIL PO) Take 1,000 mg by mouth daily.     Red Yeast Rice 600 MG TABS Take 2 tablets (1,200 mg total) by mouth daily.     rosuvastatin (CRESTOR) 20 MG tablet TAKE 1 TABLET EVERY OTHER  DAY IN THE MORNING. NEEDS TO CALL OFFICE TO SCHEDULE YEARLY EXAM 45 tablet 0   Testosterone 1.62 % GEL Apply 2 Pump topically daily.        Allergies: No Known Allergies    Past Medical History: Past Medical History:  Diagnosis Date   Adopted    Cataract    resolved with surgery   Diabetes mellitus    Dyslipidemia    Erectile dysfunction    Herpes labialis    Upper Lip   Hyperlipidemia    Male hypogonadism    Prostate cancer ([place])    Renal stones    Thyroid nodule    Right     Past Surgical History: Past Surgical History:  Procedure Laterality Date   CATARACT EXTRACTION, BILATERAL  2017   Dr. Katy Fitch   COLONOSCOPY WITH PROPOFOL N/A 03/09/2018   Procedure: COLONOSCOPY WITH PROPOFOL;  Surgeon: Lin Landsman, MD;  Location: [place];  Service: Gastroenterology;  Laterality: N/A;   COLONOSCOPY WITH PROPOFOL N/A 03/12/2021   Procedure: COLONOSCOPY WITH PROPOFOL;  Surgeon: Lin Landsman, MD;  Location: [place];  Service: Endoscopy;  Laterality: N/A;  priority 4   CYSTOSCOPY W/ URETERAL STENT PLACEMENT  08/09/2017   Procedure: CYSTOSCOPY WITH RETROGRADE PYELOGRAM/URETERAL STENT PLACEMENT;  Surgeon: Royston Cowper, MD;  Location: [facility];  Service: Urology;;   EXTRACORPOREAL SHOCK WAVE LITHOTRIPSY Left 09/15/2017   Procedure: EXTRACORPOREAL SHOCK WAVE LITHOTRIPSY (ESWL);  Surgeon: Royston Cowper, MD;  Location: [facility];  Service: Urology;  Laterality: Left;    EYE SURGERY Bilateral 2016   cataract extractions   GREEN LIGHT LASER TURP (TRANSURETHRAL RESECTION OF PROSTATE N/A 04/20/2016   Procedure: GREEN LIGHT LASER TURP (TRANSURETHRAL RESECTION OF PROSTATE;  Surgeon: Royston Cowper, MD;  Location: [facility];  Service: Urology;  Laterality: N/A;   INGUINAL HERNIA REPAIR  01/11/2012   Procedure: HERNIA REPAIR INGUINAL ADULT;  Surgeon: Pedro Earls, MD;  Location: [facility];  Service: General;  Laterality: Right;  Open right inguinal hernia repair with mesh   POLYPECTOMY  03/12/2021   Procedure: POLYPECTOMY;  Surgeon: Lin Landsman, MD;  Location: [place];  Service: Endoscopy;;   RADIOACTIVE SEED IMPLANT     PROSTATE   URETEROSCOPY WITH HOLMIUM LASER LITHOTRIPSY Right 08/09/2017   Procedure: URETEROSCOPY WITH HOLMIUM LASER LITHOTRIPSY;  Surgeon: Royston Cowper, MD;  Location: [facility];  Service: Urology;  Laterality: Right;     Family History: Family History  Adopted: Yes     Social History: Social History   Socioeconomic History   Marital status: Married    Spouse name: Not on file   Number of children: Not on file   Years of education: Not on file   Highest education level: Not on file  Occupational History   Not on file  Tobacco Use   Smoking status: Former    Years: 30.00    Types: Cigarettes    Quit date: 12/27/1990    Years since quitting: 31.4   Smokeless tobacco: Never  Vaping Use   Vaping Use: Never used  Substance and Sexual Activity   Alcohol use: Yes    Comment: Rare Socially   Drug use: No   Sexual activity: Yes  Other Topics Concern   Not on file  Social History Narrative   From [place]   Married 1971   2 children alive as of 03/25/17, 1 biological child and one stepchild   One child died 25-Mar-2017   Social Determinants of Health   Financial Resource Strain: Low Risk  (01/07/2022)   Overall Financial Resource Strain  (CARDIA)    Difficulty of Paying Living Expenses: Not hard at all  Food Insecurity: No Food Insecurity (01/07/2022)   Hunger Vital Sign    Worried About Running Out of Food in the Last Year: Never true    [place] in the Last Year: Never true  Transportation Needs: No Transportation Needs (01/07/2022)   PRAPARE - Hydrologist (Medical): No    Lack of Transportation (Non-Medical): No  Physical Activity: Insufficiently Active (01/07/2022)   Exercise Vital Sign    Days of Exercise per Week: 3 days    Minutes of Exercise per Session: 30 min  Stress: No Stress Concern Present (01/07/2022)   [place]    Feeling of Stress : Not at all  Social Connections: [place] (01/07/2022)   Social Connection and Isolation Panel [NHANES]    Frequency of Communication with Friends and Family: More than three times a week    Frequency of Social Gatherings with Friends and Family: More than three times a week    Attends Religious Services: More than 4 times per year    Active Member of [employer] or Organizations: Yes    Attends Music therapist: More than 4 times per year    Marital Status: Married  Human resources officer Violence: Not At Risk (01/07/2022)   Humiliation, Afraid, Rape, and Kick questionnaire    Fear of Current or Ex-Partner: No    Emotionally Abused: No    Physically Abused: No    Sexually Abused: No     Review of Systems: Review of Systems  Constitutional:  Negative for chills, fever and malaise/fatigue.  HENT:  Negative for congestion, sore throat and tinnitus.   Eyes:  Negative for blurred vision and redness.  Respiratory:  Negative for cough, shortness of breath and wheezing.   Cardiovascular:  Negative for chest pain, palpitations, claudication and leg swelling.  Gastrointestinal:  Negative for abdominal pain, blood in stool, diarrhea, nausea and vomiting.   Genitourinary:  Negative for flank pain, frequency and hematuria.  Musculoskeletal:  Positive for falls. Negative for back pain and myalgias.  Skin:  Negative for rash.  Neurological:  Positive for loss of consciousness. Negative for dizziness, weakness and headaches.  Endo/Heme/Allergies:  Does not bruise/bleed easily.  Psychiatric/Behavioral:  Negative for depression. The patient is not nervous/anxious and does not have insomnia.     Vital Signs: Blood pressure 112/80, pulse 98, temperature 98.6 F (37 C), temperature source Oral, resp. rate 18, height 6' (1.829 m), weight 96.6 kg, SpO2 100 %.  Weight trends: Filed Weights   06/08/22 1046  Weight: 96.6 kg    Physical Exam: General: NAD, restless  Head: Normocephalic, atraumatic. Moist oral mucosal membranes  Eyes: Anicteric  Lungs:  Clear to auscultation, normal effort, room air  Heart: Regular rate and rhythm  Abdomen:  Fine nontender, guarding  Extremities: Trace peripheral edema.  Neurologic: Nonfocal, moving all four extremities  Skin: No lesions  Access: None     Lab results: Basic Metabolic Panel: Recent Labs  Lab 06/08/22 1201 06/08/22 1253 06/09/22 0415 06/10/22 0532  NA 137  --  136 137  K HEMOLYZED SPECIMEN UNABLE TO OBTAIN RESULT 3.4* 4.8 5.1  CL 96*  --  100 106  CO2 25  --  26 21*  GLUCOSE 160*  --  140* 126*  BUN 22  --  38* 50*  CREATININE 1.69*  --  1.96* 3.95*  CALCIUM 9.6  --  8.2* 5.7*    Liver Function Tests: Recent Labs  Lab 06/08/22 1810 06/09/22 0415 06/10/22 0532  AST 1,756* 1,895* 593*  ALT 530* 621* 374*  ALKPHOS 54 57 44  BILITOT 1.9* 1.4* 1.7*  PROT 6.8 6.1* 5.6*  ALBUMIN 3.6 3.4* 2.8*   Recent Labs  Lab 06/08/22 1810 06/09/22 0415 06/10/22 0532  LIPASE 1,633* 1,741* 688*   No results for input(s): "AMMONIA" in the last 168 hours.  CBC: Recent Labs  Lab 06/08/22 1057 06/09/22 0415 06/10/22 0532  WBC 19.8* 11.4* 12.6*  NEUTROABS 15.6* 9.7*  --   HGB 15.9  14.3 15.4  HCT 50.2 43.9 48.0  MCV 85.5 84.1 83.8  PLT 222 157 154    Cardiac Enzymes: No results for input(s): "CKTOTAL", "CKMB", "CKMBINDEX", "TROPONINI" in the last 168 hours.  BNP: Invalid input(s): "POCBNP"  CBG: Recent Labs  Lab 06/09/22 0727 06/09/22 1110 06/09/22 1601 06/10/22 0738 06/10/22 1104  GLUCAP 127* 122* 111* 143* 139*    Microbiology: Results for orders placed or performed during the hospital encounter of 06/08/22  Blood culture (routine x 2)     Status: None (Preliminary result)   Collection Time: 06/08/22  1:33 PM   Specimen: BLOOD  Result Value Ref Range Status   Specimen Description BLOOD LEFT HAND  Final   Special Requests   Final    BOTTLES DRAWN AEROBIC AND ANAEROBIC Blood Culture results may not be optimal due to an inadequate volume of blood received in culture bottles   Culture   Final    NO GROWTH 2 DAYS Performed at [facility], [address], [place], [place] [address]    Report Status PENDING  Incomplete  Blood culture (routine x 2)  Status: None (Preliminary result)   Collection Time: 06/08/22  2:15 PM   Specimen: BLOOD  Result Value Ref Range Status   Specimen Description BLOOD BLOOD LEFT WRIST  Final   Special Requests   Final    BOTTLES DRAWN AEROBIC AND ANAEROBIC Blood Culture results may not be optimal due to an inadequate volume of blood received in culture bottles   Culture   Final    NO GROWTH 2 DAYS Performed at [facility], [address], [place], [place] [address]    Report Status PENDING  Incomplete    Coagulation Studies: Recent Labs    06/08/22 04-03-27  LABPROT 14.5  INR 1.1    Urinalysis: Recent Labs    06/08/22 1811  COLORURINE AMBER*  LABSPEC 1.015  PHURINE 6.0  GLUCOSEU 50*  HGBUR LARGE*  BILIRUBINUR NEGATIVE  KETONESUR NEGATIVE  PROTEINUR 100*  NITRITE NEGATIVE  LEUKOCYTESUR NEGATIVE      Imaging: US RENAL  Result Date: 06/10/2022 CLINICAL DATA:  Acute kidney  injury EXAM: RENAL / URINARY TRACT ULTRASOUND COMPLETE COMPARISON:  No prior ultrasound, correlation is made with CT abdomen pelvis and MRI abdomen 06/08/2022 FINDINGS: Right Kidney: Renal measurements: 11.6 x 5.6 x 5.2 cm = volume: 172 mL. Echogenicity within normal limits. No mass or hydronephrosis visualized. Left Kidney: Renal measurements: 12.9 x 5.8 x 5.1 cm = volume: 201 mL. Echogenicity within normal limits. No hydronephrosis visualized. Cystic lesion in the mid left kidney measures up to 4.4 x 4.7 x 5.1 cm, consistent with a simple cyst, as seen on the 06/08/2022 CT and MRI Bladder: Appears normal for degree of bladder distention. Other: Small amount of ascites. IMPRESSION: 1. No acute finding in the kidneys. 2. Small volume ascites. Electronically Signed   By: Merilyn Baba M.D.   On: 06/10/2022 12:21   ECHOCARDIOGRAM COMPLETE  Result Date: 06/09/2022    ECHOCARDIOGRAM REPORT   Patient Name:   JAGER KOSKA Date of Exam: 06/09/2022 Medical Rec #:  [id number]    Height:       72.0 in Accession #:    [id number]   Weight:       213.0 lb Date of Birth:  08/01/1946    BSA:          2.188 m Patient Age:    27 years     BP:           139/86 mmHg Patient Gender: M            HR:           74 bpm. Exam Location:  [facility] Procedure: 2D Echo, Color Doppler and Cardiac Doppler Indications:     R55 Syncope  History:         Patient has no prior history of Echocardiogram examinations.                  Risk Factors:Diabetes and Dyslipidemia.  Sonographer:     Charmayne Sheer Referring Phys:  [id number] Athena Masse Diagnosing Phys: Kate Sable MD  Sonographer Comments: Suboptimal apical window and suboptimal subcostal window. IMPRESSIONS  1. Left ventricular ejection fraction, by estimation, is 60 to 65%. The left ventricle has normal function. The left ventricle has no regional wall motion abnormalities. There is mild left ventricular hypertrophy. Left ventricular diastolic parameters are consistent with Grade I diastolic  dysfunction (impaired relaxation).  2. Right ventricular systolic function is normal. The right ventricular size is normal.  3. The mitral valve is normal  in structure. Mild mitral valve regurgitation.  4. The aortic valve is tricuspid. Aortic valve regurgitation is not visualized. Aortic valve sclerosis is present, with no evidence of aortic valve stenosis.  5. Aortic dilatation noted. There is borderline dilatation of the aortic root, measuring 38 mm.  6. The inferior vena cava is normal in size with greater than 50% respiratory variability, suggesting right atrial pressure of 3 mmHg. FINDINGS  Left Ventricle: Left ventricular ejection fraction, by estimation, is 60 to 65%. The left ventricle has normal function. The left ventricle has no regional wall motion abnormalities. The left ventricular internal cavity size was normal in size. There is  mild left ventricular hypertrophy. Left ventricular diastolic parameters are consistent with Grade I diastolic dysfunction (impaired relaxation). Right Ventricle: The right ventricular size is normal. No increase in right ventricular wall thickness. Right ventricular systolic function is normal. Left Atrium: Left atrial size was normal in size. Right Atrium: Right atrial size was normal in size. Pericardium: There is no evidence of pericardial effusion. Mitral Valve: The mitral valve is normal in structure. Mild mitral valve regurgitation. MV peak gradient, 4.0 mmHg. The mean mitral valve gradient is 1.0 mmHg. Tricuspid Valve: The tricuspid valve is normal in structure. Tricuspid valve regurgitation is trivial. Aortic Valve: The aortic valve is tricuspid. Aortic valve regurgitation is not visualized. Aortic valve sclerosis is present, with no evidence of aortic valve stenosis. Aortic valve mean gradient measures 3.0 mmHg. Aortic valve peak gradient measures 7.2  mmHg. Aortic valve area, by VTI measures 2.69 cm. Pulmonic Valve: The pulmonic valve was not well visualized.  Pulmonic valve regurgitation is not visualized. Aorta: Aortic dilatation noted. There is borderline dilatation of the aortic root, measuring 38 mm. Venous: The inferior vena cava is normal in size with greater than 50% respiratory variability, suggesting right atrial pressure of 3 mmHg. IAS/Shunts: No atrial level shunt detected by color flow Doppler.  LEFT VENTRICLE PLAX 2D LVIDd:         4.48 cm   Diastology LVIDs:         3.25 cm   LV e' medial:    6.42 cm/s LV PW:         1.07 cm   LV E/e' medial:  6.8 LV IVS:        1.01 cm   LV e' lateral:   7.83 cm/s LVOT diam:     2.20 cm   LV E/e' lateral: 5.6 LV SV:         53 LV SV Index:   24 LVOT Area:     3.80 cm  RIGHT VENTRICLE RV Basal diam:  2.30 cm LEFT ATRIUM             Index        RIGHT ATRIUM           Index LA diam:        3.90 cm 1.78 cm/m   RA Area:     11.40 cm LA Vol (A2C):   31.8 ml 14.53 ml/m  RA Volume:   21.60 ml  9.87 ml/m LA Vol (A4C):   30.7 ml 14.03 ml/m LA Biplane Vol: 32.9 ml 15.03 ml/m  AORTIC VALVE                    PULMONIC VALVE AV Area (Vmax):    2.74 cm     PV Vmax:       0.98 m/s AV Area (Vmean):   2.84 cm  PV Vmean:      70.200 cm/s AV Area (VTI):     2.69 cm     PV VTI:        0.136 m AV Vmax:           134.00 cm/s  PV Peak grad:  3.9 mmHg AV Vmean:          84.600 cm/s  PV Mean grad:  2.0 mmHg AV VTI:            0.198 m AV Peak Grad:      7.2 mmHg AV Mean Grad:      3.0 mmHg LVOT Vmax:         96.60 cm/s LVOT Vmean:        63.300 cm/s LVOT VTI:          0.140 m LVOT/AV VTI ratio: 0.71  AORTA Ao Root diam: 3.80 cm MITRAL VALVE MV Area (PHT): 2.25 cm    SHUNTS MV Area VTI:   2.66 cm    Systemic VTI:  0.14 m MV Peak grad:  4.0 mmHg    Systemic Diam: 2.20 cm MV Mean grad:  1.0 mmHg MV Vmax:       1.00 m/s MV Vmean:      52.6 cm/s MV Decel Time: 337 msec MV E velocity: 43.70 cm/s MV A velocity: 97.30 cm/s MV E/A ratio:  0.45 Kate Sable MD Electronically signed by Kate Sable MD Signature Date/Time:  06/09/2022/6:43:28 PM    Final    MR ABDOMEN MRCP WO CONTRAST  Result Date: 06/09/2022 CLINICAL DATA:  A 76 year old male presents for follow-up of imaging that was acquired earlier on the same date to assess the biliary tree in the setting of acute necrotic pancreatitis. Please see earlier MRI for further details regarding pancreatitis and associated findings. EXAM: MRI ABDOMEN WITHOUT CONTRAST  (INCLUDING MRCP) TECHNIQUE: Multiplanar multisequence MR imaging of the abdomen was performed. Heavily T2-weighted images of the biliary and pancreatic ducts were obtained, and three-dimensional MRCP images were rendered by post processing. COMPARISON:  MRI of the same date for which this study was done to complete the evaluation of the biliary tree. FINDINGS: Lower chest: Effusions partially visualized in the lower chest. Not well assessed. Hepatobiliary: Hepatic findings with hepatic lesions and portal venous thrombosis as well as hepatic cysts better outlined on previous MRI evaluation. There is sludge in the gallbladder. There is gallbladder wall edema as outlined on previous imaging. There is no discrete evidence of choledocholithiasis. The biliary tree is nondilated. The pancreatic duct is nondilated. Pancreas: Pancreatic edema as seen on recent imaging better displayed on recent MR evaluation performed earlier the same day. Spleen:  Not well evaluated better evaluated on previous imaging. Adrenals/Urinary Tract: Similarly with limited assessment on this dedicated MRCP. Stomach/Bowel: Edema of the stomach and adjacent small bowel also better displayed on recent dedicated abdominal MRI. Vascular/Lymphatic:  Not well evaluated Other: Extensive edema throughout the upper abdomen with signs of perihepatic ascites. Musculoskeletal: Not well evaluated IMPRESSION: No signs of choledocholithiasis without dilation of the biliary tree. See dedicated abdominal MRI for further details regarding liver and pancreas as well as  associated findings of acute necrotic pancreatitis. Electronically Signed   By: Zetta Bills M.D.   On: 06/09/2022 15:54   MR 3D Recon At Scanner  Result Date: 06/09/2022 CLINICAL DATA:  A 76 year old male presents for follow-up of imaging that was acquired earlier on the same date to assess the biliary tree in the setting  of acute necrotic pancreatitis. Please see earlier MRI for further details regarding pancreatitis and associated findings. EXAM: MRI ABDOMEN WITHOUT CONTRAST  (INCLUDING MRCP) TECHNIQUE: Multiplanar multisequence MR imaging of the abdomen was performed. Heavily T2-weighted images of the biliary and pancreatic ducts were obtained, and three-dimensional MRCP images were rendered by post processing. COMPARISON:  MRI of the same date for which this study was done to complete the evaluation of the biliary tree. FINDINGS: Lower chest: Effusions partially visualized in the lower chest. Not well assessed. Hepatobiliary: Hepatic findings with hepatic lesions and portal venous thrombosis as well as hepatic cysts better outlined on previous MRI evaluation. There is sludge in the gallbladder. There is gallbladder wall edema as outlined on previous imaging. There is no discrete evidence of choledocholithiasis. The biliary tree is nondilated. The pancreatic duct is nondilated. Pancreas: Pancreatic edema as seen on recent imaging better displayed on recent MR evaluation performed earlier the same day. Spleen:  Not well evaluated better evaluated on previous imaging. Adrenals/Urinary Tract: Similarly with limited assessment on this dedicated MRCP. Stomach/Bowel: Edema of the stomach and adjacent small bowel also better displayed on recent dedicated abdominal MRI. Vascular/Lymphatic:  Not well evaluated Other: Extensive edema throughout the upper abdomen with signs of perihepatic ascites. Musculoskeletal: Not well evaluated IMPRESSION: No signs of choledocholithiasis without dilation of the biliary tree. See  dedicated abdominal MRI for further details regarding liver and pancreas as well as associated findings of acute necrotic pancreatitis. Electronically Signed   By: Zetta Bills M.D.   On: 06/09/2022 15:54   NM Pulmonary Perfusion  Result Date: 06/09/2022 CLINICAL DATA:  Chest pain EXAM: NUCLEAR MEDICINE PERFUSION LUNG SCAN TECHNIQUE: Perfusion images were obtained in multiple projections after intravenous injection of radiopharmaceutical. Ventilation scans intentionally deferred if perfusion scan and chest x-ray adequate for interpretation during COVID 19 epidemic. RADIOPHARMACEUTICALS:  3.96 mCi Tc-5mMAA IV COMPARISON:  Chest x-ray dated June 08, 2022 FINDINGS: No large segmental perfusion defects. Small subsegmental perfusion defects seen in the right lung. IMPRESSION: Nondiagnostic (low or intermediate probability) Electronically Signed   By: LYetta GlassmanM.D.   On: 06/09/2022 14:25   MR ABDOMEN W WO CONTRAST  Result Date: 06/09/2022 CLINICAL DATA:  Abnormalities on recent CT evaluation. EXAM: MRI ABDOMEN WITHOUT AND WITH CONTRAST TECHNIQUE: Multiplanar multisequence MR imaging of the abdomen was performed both before and after the administration of intravenous contrast. CONTRAST:  149mGADAVIST GADOBUTROL 1 MMOL/ML IV SOLN COMPARISON:  Comparison made with CT evaluation of January 08, 2022. FINDINGS: Lower chest: Incidental imaging of the lung bases without effusion or consolidation. Hepatobiliary: Multi cystic appearing area in the RIGHT hepatic lobe within the distribution of portal venous thrombus showing variable perfusion in this area measuring 9.5 x 6.4 cm (image 33/13) portal venous thrombus to RIGHT superior and anterior portal venous branches subtending hepatic subsegment VIII and V. there are areas of intrinsic T1 hyperintensity that surround this lesion and are interspersed throughout the lesion. A second hypovascular lesion is demonstrated in the posterior LEFT hemiliver, lateral  segment (image 28/13) also with intrinsic T1 hyperintensity measuring 17 mm. Perfusional changes in the area related to portal venous thrombus, less conspicuous on delayed phase. Hepatic cysts elsewhere in the liver. Mild pericholecystic fluid and edema in the gallbladder wall with mild enhancement of the gallbladder wall. Mild intrahepatic biliary duct distension. Subtle area of T2 hypointensity along the distal common bile duct up to 2-3 mm is nonspecific on thick section non MRCP sequences. Note that the study  was not performed as an MRCP. Signs of peribiliary enhancement in the setting of acute pancreatitis, see below. Pancreas: Extensive peripancreatic and interstitial pancreatic edema compatible with pancreatitis. Diffuse edema and developing fluid without focal characteristics tracking throughout the anterior pararenal space and about the peripancreatic fat and lesser sac. The amount of fluid and edema is increased over the short interval since the CT which was performed 8 hours prior to this exam. Edema blunts the intrinsic T1 signal that is seen in the pancreas. An area of non enhancement is present in the tail of the pancreas. This measures 2.8 x 2.4 cm (image 47/17) an additional area of pancreatic necrosis present in the neck of the pancreas (image 52/17 measuring 2.4 cm in the axial plane. There is no sign of ductal dilation. Spleen: Splenic vein is patent. Spleen is normal size without focal lesion. Edema tracks towards the spleen in the LEFT upper quadrant, increased since previous imaging as discussed. Adrenals/Urinary Tract:  Adrenal glands are normal. Bosniak category I cyst in the lower pole the LEFT kidney measuring 4.7 cm for which no follow-up is recommended in the absence of localizing symptoms. No suspicious renal lesion or hydronephrosis. Stomach/Bowel: Edema of the gastric antrum and duodenum in the setting of acute necrotic and edematous pancreatitis Vascular/Lymphatic: Portal venous  thrombus of anterior RIGHT hepatic portal venous branches. Main portal vein is patent as are LEFT portal branches. SMV is patent and splenic vein is patent. Normal caliber of the abdominal aorta. No adenopathy in the abdomen. Other: Small volume ascites. Anterior pararenal fluid and edema measuring up to 9.8 x 2.8 cm on the RIGHT is currently nonfocal and is increased in volume also along the LEFT hemiabdomen tracking towards the spleen and throughout the anterior pararenal space. Musculoskeletal: No suspicious bone lesions identified. IMPRESSION: 1. Multi cystic area in the RIGHT hepatic lobe following a vascular distribution, affected by portal venous thrombus, in isolation is most suspicious for developing micro abscesses in the setting of poor vascular flow related to portal thrombus and acute necrotic pancreatitis. There is however intrinsic T1 hyperintensity which can be seen in the setting of hemorrhage, mucin or melanin (melanoma, felt unlikely based on distribution and associated findings at this time). Alternative neoplastic considerations remain in the differential due to a second smaller lesion in the LEFT hepatic lobe which is away from the affected vascular distribution, also with intrinsic T1 hyperintensity. For this reason sampling of the area in the RIGHT hepatic lobe may be helpful to exclude underlying malignancy and obtain a sample for microbiologic assessment. Ultimately, 8-12 week follow-up after therapy even if biopsy is negative is suggested to ensure resolution. 2. Acute necrotic and edematous pancreatitis with worsening since previous imaging. Focal areas of necrosis can lead to ductal interruption and for this reason close follow-up is suggested. 3. Mild intrahepatic biliary duct distension. Subtle area of T2 hypointensity along the distal common bile duct cannot be confirmed as intraductal stone and may simply represent volume averaging but does raise the question of intraductal calculus.  Dedicated MRCP may be helpful in the setting of worsening pancreatitis. 4. Gallbladder edema with under distension favored to be secondary to severe acute pancreatitis, attention on follow-up. Could consider HIDA scan as warranted if there is continued concern for concomitant cholecystitis. 5. Increase in anterior pararenal fluid since previous imaging related to worsening of pancreatitis. These results will be called to the ordering clinician or representative by the Radiologist Assistant, and communication documented in the PACS or [facility]. Electronically Signed   By: Zetta Bills M.D.   On: 06/09/2022 09:10   US ABDOMEN LIMITED RUQ (LIVER/GB)  Result Date: 06/08/2022 CLINICAL DATA:  Right upper quadrant abdominal pain EXAM: ULTRASOUND ABDOMEN LIMITED RIGHT UPPER QUADRANT COMPARISON:  None Available. FINDINGS: Gallbladder: The gallbladder wall is mildly thickened and there is trace pericholecystic fluid, nonspecific. This can be seen in the setting of acute calculus cholecystitis or inflammatory conditions of the biliary tree, as can be seen with cholangitis or pancreatitis. The gallbladder is not distended. No intraluminal stones or sludge is identified. The sonographic Percell Miller sign is reportedly negative. Common bile duct: Diameter: 2 mm in proximal diameter Liver: There is intrahepatic biliary ductal dilation noted within the right hepatic lobe and within the biliary hilum. There is superimposed heterogeneity of the hepatic echotexture in this region, best appreciated on # [id number] and and ill-defined mixed solid and cystic mass is difficult to exclude. Multiple additional simple cysts are identified within the left hepatic lobe. Portal vein is patent on color Doppler imaging with normal direction of blood flow towards the liver. Other: No ascites IMPRESSION: 1. Mild gallbladder wall thickening with trace pericholecystic fluid, nonspecific. No evidence for cholelithiasis or gallbladder sludge. 2.  Intrahepatic biliary ductal dilation within the right hepatic lobe and biliary hilum. There is superimposed heterogeneity of the hepatic echotexture in this region and ill-defined mixed solid and cystic mass is difficult to exclude. Further evaluation with contrast-enhanced MRI is recommended. 3. Multiple additional simple cysts within the left hepatic lobe. Electronically Signed   By: Fidela Salisbury M.D.   On: 06/08/2022 18:17     Assessment & Plan: Mr. MOSS BERRY is a 76 y.o.  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  Normal kidney function prior to admission.  IV contrast exposure on 06/08/2022 and 06/09/2022.  Renal ultrasound negative for obstruction.  Urinalysis shows hematuria and mild proteinuria.  No acute indication for dialysis at this time.  Agree with IV fluids.  Treatment of underlying cause may show improvement with renal function.  We will continue to monitor labs, patient presentation, and urine output.  Avoid nephrotoxic agents and therapies.  Additional testing and seriologies ordered, ANA, C3/C4, ANCA, phosphorus, CK.and CRP  2.  Acute pancreatitis, severe necrotizing.  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  Currently n.p.o.  Supportive care with antiemetics and pain control.  High rate IV fluids.  IV antibiotics prescribed.  3.  Diabetes mellitus type 2 without complication.Home regimen includes metformin.  Hemoglobin A1c 6.2 on 01/08/2022.  Glucose control managed by primary team.   LOS: 2 Colon Flattery 6/15/20233:44 PM

## 2022-06-10 NOTE — Consult Note (Addendum)
NAME:  Xavier Esparza, MRN:  [id number], DOB:  1946/02/15, LOS: 2 ADMISSION DATE:  06/08/2022   History of Present Illness:  76 y.o. male with medical history significant for DM, BPH, history of prostate cancer, kidney stones status post lithotripsy, negative colonoscopy 2022 (3 benign polyps),    Presented to the ER via EMS following a syncopal episode at home while walking to the bathroom when his wife heard a loud thud and found him on the floor and was unable to pick himself up 911 was called  2 weeks prior to admission he saw blood in his urine similar to when he had a kidney stone 1 day prior to admission patient was seen by urologist and gave prescription of Levaquin which was a new medication   Patient took 2 doses of Levaquin and subsequently had a syncopal episode Patient found to have severe pancreatitis Elevated lipase Abnormal CT chest   When EMS arrived patient was hypotensive patient had a creatinine of 1.6 with elevated lactic acidosis Lipase 1600 According to the wife patient eats healthy Triglyceride levels within normal limits No history of alcohol abuse Non-smoker  ER course CT of the abdomen pelvis showed moderate pancreatitis with portal vein thrombosis Patient started on heparin infusion started on antibiotics Admitted for severe sepsis PCCM consulted for progressive worsening pancreatitis  Pertinent  Medical History  NEW MEDICATION LEVOFLOXACIN WAS TAKEN PRIOR TO DX OF PANCREATITIS  Significant Hospital Events: Including procedures, antibiotic start and stop dates in addition to other pertinent events   6/14 admitted for severe sepsis and pancreatitis with lactic acidosis 6/15 PCCM consulted for worsening pancreatitis acidosis renal failure and closer monitoring     Micro Data:  COVID test has not been checked   Antimicrobials:   Antibiotics Given (last 72 hours)     Date/Time Action Medication Dose Rate   06/08/22 1418 New Bag/Given    vancomycin (VANCOCIN) IVPB 1000 mg/200 mL premix 1,000 mg 200 mL/hr   06/08/22 1419 New Bag/Given   ceFEPIme (MAXIPIME) 2 g in sodium chloride 0.9 % 100 mL IVPB 2 g 200 mL/hr   06/08/22 1523 New Bag/Given   metroNIDAZOLE (FLAGYL) IVPB 500 mg 500 mg 100 mL/hr   06/09/22 0005 New Bag/Given   ceFEPIme (MAXIPIME) 2 g in sodium chloride 0.9 % 100 mL IVPB 2 g 200 mL/hr   06/09/22 0005 New Bag/Given   metroNIDAZOLE (FLAGYL) IVPB 500 mg 500 mg 100 mL/hr   06/09/22 0844 New Bag/Given   piperacillin-tazobactam (ZOSYN) IVPB 3.375 g 3.375 g 12.5 mL/hr   06/09/22 1422 New Bag/Given   piperacillin-tazobactam (ZOSYN) IVPB 3.375 g 3.375 g 12.5 mL/hr   06/09/22 2121 New Bag/Given   piperacillin-tazobactam (ZOSYN) IVPB 3.375 g 3.375 g 12.5 mL/hr   06/10/22 0557 New Bag/Given   piperacillin-tazobactam (ZOSYN) IVPB 3.375 g 3.375 g 12.5 mL/hr   06/10/22 1431 New Bag/Given   piperacillin-tazobactam (ZOSYN) IVPB 3.375 g 3.375 g 12.5 mL/hr            Interim History / Subjective:  Admitted to ICU stepdown bed Severe pancreatitis Abdominal pain Lactic acidosis      Objective   Blood pressure 130/82, pulse (!) 110, temperature 97.7 F (36.5 C), temperature source Oral, resp. rate 18, height 6' (1.829 m), weight 102 kg, SpO2 91 %.        Intake/Output Summary (Last 24 hours) at 06/10/2022 1705 Last data filed at 06/10/2022 1000 Gross per 24 hour  Intake 1041.03 ml  Output 250 ml  Net 791.03 ml   Filed Weights   06/08/22 1046 06/10/22 1610  Weight: 96.6 kg 102 kg      REVIEW OF SYSTEMS  PATIENT IS UNABLE TO PROVIDE COMPLETE REVIEW OF SYSTEMS DUE TO SEVERE CRITICAL ILLNESS  And lethargy  PHYSICAL EXAMINATION:  GENERAL:critically ill appearing EYES: Pupils equal, round, reactive to light.  No scleral icterus.  MOUTH: Moist mucosal membrane.  NECK: Supple.  PULMONARY: +rhonchi, +wheezing CARDIOVASCULAR: S1 and S2.  No murmurs  GASTROINTESTINAL: +distended. Decreased bowel  sounds. +abd tendernass all over MUSCULOSKELETAL: No swelling, clubbing, or edema.  NEUROLOGIC: obtunded SKIN:intact,warm,dry     Labs/imaging that I havepersonally reviewed  (right click and "Reselect all SmartList Selections" daily)      ASSESSMENT AND PLAN SYNOPSIS  76 year old African-American male with acute onset of severe pancreatitis with abdominal pain leading to lactic acidosis and acute renal failure likely related to medications  Patient is at high risk for cardiac arrest Patient is at high risk for intubation  Patient currently alert and awake but lethargic is able to maintain his own blood pressure and is not hypoxic at the moment  At this time I agree with stepdown ICU status monitoring Pain control Supportive care and antibiotics Continue heparin for portal vein thrombosis Follow-up general surgery and GI recommendations RECOMMEND COVID TESTING   ACUTE KIDNEY INJURY/Renal Failure -continue Foley Catheter-assess need -Avoid nephrotoxic agents -Follow urine output, BMP -Ensure adequate renal perfusion, optimize oxygenation -Renal dose medications   Intake/Output Summary (Last 24 hours) at 06/10/2022 1705 Last data filed at 06/10/2022 1000 Gross per 24 hour  Intake 1041.03 ml  Output 250 ml  Net 791.03 ml    ENDO - ICU hypoglycemic\Hyperglycemia protocol -check FSBS per protocol   GI GI PROPHYLAXIS as indicated  NUTRITIONAL STATUS DIET-->npo Constipation protocol as indicated   ELECTROLYTES -follow labs as needed -replace as needed -pharmacy consultation and following   ACUTE ANEMIA- TRANSFUSE AS NEEDED CONSIDER TRANSFUSION  IF HGB<7     Best practice (right click and "Reselect all SmartList Selections" daily)  Diet: NPO  DVT prophylaxis: Systemic AC GI prophylaxis: PPI  Mobility:  bed rest  Code Status:  FULL Disposition:ICU  Labs   CBC: Recent Labs  Lab 06/08/22 1057 06/09/22 0415 06/10/22 0532 06/10/22 1529  WBC  19.8* 11.4* 12.6* 14.2*  NEUTROABS 15.6* 9.7*  --   --   HGB 15.9 14.3 15.4 14.3  HCT 50.2 43.9 48.0 44.1  MCV 85.5 84.1 83.8 84.0  PLT 222 157 154 350    Basic Metabolic Panel: Recent Labs  Lab 06/08/22 1201 06/08/22 1253 06/09/22 0415 06/10/22 0532 06/10/22 1529  NA 137  --  136 137 137  K HEMOLYZED SPECIMEN UNABLE TO OBTAIN RESULT 3.4* 4.8 5.1 4.8  CL 96*  --  100 106 102  CO2 25  --  26 21* 23  GLUCOSE 160*  --  140* 126* 131*  BUN 22  --  38* 50* 67*  CREATININE 1.69*  --  1.96* 3.95* 5.29*  CALCIUM 9.6  --  8.2* 5.7* 5.6*   GFR: Estimated Creatinine Clearance: 14.5 mL/min (A) (by C-G formula based on SCr of 5.29 mg/dL (H)). Recent Labs  Lab 06/08/22 1057 06/08/22 1316 06/08/22 1708 06/09/22 0415 06/10/22 0532 06/10/22 0938 06/10/22 1529  PROCALCITON  --   --   --  6.81 19.36  --   --   WBC 19.8*  --   --  11.4* 12.6*  --  14.2*  LATICACIDVEN  --    < >  5.5*  --  2.9* 3.5* 4.0*   < > = values in this interval not displayed.    Liver Function Tests: Recent Labs  Lab 06/08/22 1810 06/09/22 0415 06/10/22 0532  AST 1,756* 1,895* 593*  ALT 530* 621* 374*  ALKPHOS 54 57 44  BILITOT 1.9* 1.4* 1.7*  PROT 6.8 6.1* 5.6*  ALBUMIN 3.6 3.4* 2.8*   Recent Labs  Lab 06/08/22 1810 06/09/22 0415 06/10/22 0532  LIPASE 1,633* 1,741* 688*   No results for input(s): "AMMONIA" in the last 168 hours.  ABG    Component Value Date/Time   PHART 7.41 06/10/2022 1530   PCO2ART 31 (L) 06/10/2022 1530   PO2ART 65 (L) 06/10/2022 1530   HCO3 19.6 (L) 06/10/2022 1530   ACIDBASEDEF 4.0 (H) 06/10/2022 1530   O2SAT 94.9 06/10/2022 1530     Coagulation Profile: Recent Labs  Lab 06/08/22 2028  INR 1.1    Cardiac Enzymes: No results for input(s): "CKTOTAL", "CKMB", "CKMBINDEX", "TROPONINI" in the last 168 hours.  HbA1C: Hgb A1c MFr Bld  Date/Time Value Ref Range Status  01/08/2022 10:34 AM 6.2 4.6 - 6.5 % Final    Comment:    Glycemic Control Guidelines for  People with Diabetes:Non Diabetic:  <6%Goal of Therapy: <7%Additional Action Suggested:  >8%   07/02/2021 08:46 AM 6.0 4.6 - 6.5 % Final    Comment:    Glycemic Control Guidelines for People with Diabetes:Non Diabetic:  <6%Goal of Therapy: <7%Additional Action Suggested:  >8%     CBG: Recent Labs  Lab 06/09/22 1110 06/09/22 1601 06/10/22 0738 06/10/22 1104 06/10/22 1617  GLUCAP 122* 111* 143* 139* 140*     Past Medical History:  He,  has a past medical history of Adopted, Cataract, Diabetes mellitus, Dyslipidemia, Erectile dysfunction, Herpes labialis, Hyperlipidemia, Male hypogonadism, Prostate cancer ([place]), Renal stones, and Thyroid nodule.   Surgical History:   Past Surgical History:  Procedure Laterality Date   CATARACT EXTRACTION, BILATERAL  2017   Dr. Katy Fitch   COLONOSCOPY WITH PROPOFOL N/A 03/09/2018   Procedure: COLONOSCOPY WITH PROPOFOL;  Surgeon: Lin Landsman, MD;  Location: [facility] ENDOSCOPY;  Service: Gastroenterology;  Laterality: N/A;   COLONOSCOPY WITH PROPOFOL N/A 03/12/2021   Procedure: COLONOSCOPY WITH PROPOFOL;  Surgeon: Lin Landsman, MD;  Location: [place];  Service: Endoscopy;  Laterality: N/A;  priority 4   CYSTOSCOPY W/ URETERAL STENT PLACEMENT  08/09/2017   Procedure: CYSTOSCOPY WITH RETROGRADE PYELOGRAM/URETERAL STENT PLACEMENT;  Surgeon: Royston Cowper, MD;  Location: [facility] ORS;  Service: Urology;;   EXTRACORPOREAL SHOCK WAVE LITHOTRIPSY Left 09/15/2017   Procedure: EXTRACORPOREAL SHOCK WAVE LITHOTRIPSY (ESWL);  Surgeon: Royston Cowper, MD;  Location: [facility] ORS;  Service: Urology;  Laterality: Left;   EYE SURGERY Bilateral 2016   cataract extractions   GREEN LIGHT LASER TURP (TRANSURETHRAL RESECTION OF PROSTATE N/A 04/20/2016   Procedure: GREEN LIGHT LASER TURP (TRANSURETHRAL RESECTION OF PROSTATE;  Surgeon: Royston Cowper, MD;  Location: [facility] ORS;  Service: Urology;  Laterality: N/A;   INGUINAL HERNIA REPAIR  01/11/2012   Procedure:  HERNIA REPAIR INGUINAL ADULT;  Surgeon: Pedro Earls, MD;  Location: [place];  Service: General;  Laterality: Right;  Open right inguinal hernia repair with mesh   POLYPECTOMY  03/12/2021   Procedure: POLYPECTOMY;  Surgeon: Lin Landsman, MD;  Location: [place];  Service: Endoscopy;;   RADIOACTIVE SEED IMPLANT     PROSTATE   URETEROSCOPY WITH HOLMIUM LASER LITHOTRIPSY Right 08/09/2017  Procedure: URETEROSCOPY WITH HOLMIUM LASER LITHOTRIPSY;  Surgeon: Royston Cowper, MD;  Location: [facility] ORS;  Service: Urology;  Laterality: Right;     Social History:   reports that he quit smoking about 31 years ago. His smoking use included cigarettes. He has never used smokeless tobacco. He reports current alcohol use. He reports that he does not use drugs.   Family History:  His family history is not on file. He was adopted.   Allergies No Known Allergies   Home Medications  Prior to Admission medications   Medication Sig Start Date End Date Taking? Authorizing Provider  acyclovir (ZOVIRAX) 800 MG tablet TAKE 1 TABLET DAILY 01/25/22  Yes Tonia Ghent, MD  aspirin EC 81 MG tablet Take 81 mg by mouth daily. Swallow whole.   Yes [provider]  Calcium Carbonate (CALCIUM 600 PO) Take 2 tablets by mouth daily.   Yes [provider]  GLUCOSAMINE HCL PO Take 1 tablet by mouth daily.    Yes [provider]  losartan (COZAAR) 25 MG tablet Take 1 tablet (25 mg total) by mouth daily. 12/29/21  Yes Tower, Wynelle Fanny, MD  Magnesium 500 MG TABS Take 1 tablet by mouth daily.   Yes [provider]  metFORMIN (GLUCOPHAGE) 500 MG tablet TAKE 1 TABLET DAILY 01/25/22  Yes Tonia Ghent, MD  Multiple Vitamin (MULTIVITAMIN) tablet Take 1 tablet by mouth daily. Reported on 01/07/2016   Yes [provider]  Omega-3 Fatty Acids ([employer] FISH OIL PO) Take 1,000 mg by mouth daily.   Yes [provider]  Red Yeast Rice 600 MG TABS Take 2  tablets (1,200 mg total) by mouth daily. 01/08/22  Yes Tonia Ghent, MD  rosuvastatin (CRESTOR) 20 MG tablet TAKE 1 TABLET EVERY OTHER  DAY IN THE MORNING. NEEDS TO CALL OFFICE TO SCHEDULE YEARLY EXAM 04/12/22  Yes Tonia Ghent, MD  Testosterone 1.62 % GEL Apply 2 Pump topically daily. 05/12/22  Yes [provider]       DVT/GI PRX  assessed I Assessed the need for Labs I Assessed the need for Foley I Assessed the need for Central Venous Line Family Discussion when available I Assessed the need for Mobilization I made an Assessment of medications to be adjusted accordingly Safety Risk assessment completed  CASE DISCUSSED IN MULTIDISCIPLINARY ROUNDS WITH ICU TEAM     Critical Care Time devoted to patient care services described in this note is 65 minutes.   Critical care was necessary to treat /prevent imminent and life-threatening deterioration.  I ANTICIPATE PROLONGED ICU LOS  Patient is critically ill. Patient with Multiorgan failure and at high risk for cardiac arrest and death.    Corrin Parker, M.D.  [facility]  Medical Director [place] Director [facility] Cardio-Pulmonary Department

## 2022-06-10 NOTE — Progress Notes (Signed)
[place] for heparin infusion initiation & monitoring Indication: portal vein thrombosis  No Known Allergies  Patient Measurements: Height: 6' (182.9 cm) Weight: 102 kg (224 lb 13.9 oz) IBW/kg (Calculated) : 77.6 Heparin Dosing Weight: 96.6 kg  Vital Signs: Temp: 97.7 F (36.5 C) (06/15 1610) Temp Source: Oral (06/15 1610) BP: 91/77 (06/15 2200) Pulse Rate: 105 (06/15 2200)  Labs: Recent Labs    06/08/22 1201 06/08/22 1253 06/08/22 2028 06/09/22 0415 06/10/22 0532 06/10/22 1529 06/10/22 1618 06/10/22 2155  HGB  --   --   --  14.3 15.4 14.3  --   --   HCT  --   --   --  43.9 48.0 44.1  --   --   PLT  --   --   --  157 154 167  --   --   LABPROT  --   --  14.5  --   --   --   --   --   INR  --   --  1.1  --   --   --   --   --   HEPARINUNFRC  --   --   --   --   --   --   --  0.79*  CREATININE 1.69*  --   --  1.96* 3.95* 5.29*  --   --   CKTOTAL  --   --   --   --   --   --  1,611*  --   TROPONINIHS 15 16  --   --   --   --   --   --      Estimated Creatinine Clearance: 14.5 mL/min (A) (by C-G formula based on SCr of 5.29 mg/dL (H)).   Medical History: Past Medical History:  Diagnosis Date   Adopted    Cataract    resolved with surgery   Diabetes mellitus    Dyslipidemia    Erectile dysfunction    Herpes labialis    Upper Lip   Hyperlipidemia    Male hypogonadism    Prostate cancer ([place])    Renal stones    Thyroid nodule    Right    Medications:  Scheduled:   [START ON 06/11/2022] Chlorhexidine Gluconate Cloth  6 each Topical Q0600   insulin aspart  0-15 Units Subcutaneous Q4H   metoCLOPramide (REGLAN) injection  5 mg Intravenous Q8H    Assessment: 76 y.o. male w/ PMH of  DM, BPH, prostate cancer, kidney stones s/p lithotripsy admitted on 06/08/2022 with acute pancreatitis and portal vein thrombosis. No chronic anticoagulation prior to arrival. H&H, platelets stable at baseline, INR 1.1  Goal of Therapy:   Heparin level 0.3-0.7 units/ml Monitor platelets by anticoagulation protocol: Yes  6/15 2155 HL 0.79, supratherapeutic   Plan:  Decrease heparin infusion to 1600 units/hr Recheck HL in 8 hr after rate change CBC daily while on heparin  Renda Rolls, PharmD, [facility] 06/10/2022 11:10 PM

## 2022-06-10 NOTE — Consult Note (Signed)
[facility] SURGICAL CONSULTATION NOTE (initial) - cpt: 99255   HISTORY OF PRESENT ILLNESS (HPI):  76 y.o. male presented to [facility] ED on June 13 for evaluation of a syncopal episode with fall, whose work-up identified severe pancreatitis. Patient reports some improvement since admission but clearly remains uncomfortable, noting his pain is worse in the right lower quadrant. Admitted with severe sepsis, with portal vein thrombosis, acute pancreatitis, AKI.  Initially was heparinized but this was withheld and resumed recently.  MRCP completed ruling out choledocholithiasis.  LFTs monitored with diminishing transaminases and lipase.  Venous lactate continues to climb today.  Admitting white count was 20,000, diminishing to 11 and now gradually climbing to 14.2.  Renal function also appears to be decreasing with climbing creatinine currently at 5.29  Surgery is consulted by hospitalist physician Dr. 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  PAST MEDICAL HISTORY (PMH):  Past Medical History:  Diagnosis Date   Adopted    Cataract    resolved with surgery   Diabetes mellitus    Dyslipidemia    Erectile dysfunction    Herpes labialis    Upper Lip   Hyperlipidemia    Male hypogonadism    Prostate cancer ([place])    Renal stones    Thyroid nodule    Right     PAST SURGICAL HISTORY ([place]):  Past Surgical History:  Procedure Laterality Date   CATARACT EXTRACTION, BILATERAL  2017   Dr. Katy Fitch   COLONOSCOPY WITH PROPOFOL N/A 03/09/2018   Procedure: COLONOSCOPY WITH PROPOFOL;  Surgeon: Lin Landsman, MD;  Location: [place];  Service: Gastroenterology;  Laterality: N/A;   COLONOSCOPY WITH PROPOFOL N/A 03/12/2021   Procedure: COLONOSCOPY WITH PROPOFOL;  Surgeon: Lin Landsman, MD;  Location: [place];  Service: Endoscopy;  Laterality: N/A;  priority 4   CYSTOSCOPY W/ URETERAL STENT PLACEMENT  08/09/2017   Procedure:  CYSTOSCOPY WITH RETROGRADE PYELOGRAM/URETERAL STENT PLACEMENT;  Surgeon: Royston Cowper, MD;  Location: [facility];  Service: Urology;;   EXTRACORPOREAL SHOCK WAVE LITHOTRIPSY Left 09/15/2017   Procedure: EXTRACORPOREAL SHOCK WAVE LITHOTRIPSY (ESWL);  Surgeon: Royston Cowper, MD;  Location: [facility];  Service: Urology;  Laterality: Left;   EYE SURGERY Bilateral 2016   cataract extractions   GREEN LIGHT LASER TURP (TRANSURETHRAL RESECTION OF PROSTATE N/A 04/20/2016   Procedure: GREEN LIGHT LASER TURP (TRANSURETHRAL RESECTION OF PROSTATE;  Surgeon: Royston Cowper, MD;  Location: [facility];  Service: Urology;  Laterality: N/A;   INGUINAL HERNIA REPAIR  01/11/2012   Procedure: HERNIA REPAIR INGUINAL ADULT;  Surgeon: Pedro Earls, MD;  Location: [place];  Service: General;  Laterality: Right;  Open right inguinal hernia repair with mesh   POLYPECTOMY  03/12/2021   Procedure: POLYPECTOMY;  Surgeon: Lin Landsman, MD;  Location: [place];  Service: Endoscopy;;   RADIOACTIVE SEED IMPLANT     PROSTATE   URETEROSCOPY WITH HOLMIUM LASER LITHOTRIPSY Right 08/09/2017   Procedure: URETEROSCOPY WITH HOLMIUM LASER LITHOTRIPSY;  Surgeon: Royston Cowper, MD;  Location: [facility];  Service: Urology;  Laterality: Right;     MEDICATIONS:  Prior to Admission medications   Medication Sig Start Date End Date Taking? Authorizing Provider  acyclovir (ZOVIRAX) 800 MG tablet TAKE 1 TABLET DAILY 01/25/22  Yes Tonia Ghent, MD  aspirin EC 81 MG tablet Take 81 mg by mouth daily. Swallow whole.   Yes [provider]  Calcium Carbonate (CALCIUM 600 PO)  Take 2 tablets by mouth daily.   Yes [provider]  GLUCOSAMINE HCL PO Take 1 tablet by mouth daily.    Yes [provider]  losartan (COZAAR) 25 MG tablet Take 1 tablet (25 mg total) by mouth daily. 12/29/21  Yes Tower, Wynelle Fanny, MD  Magnesium 500 MG TABS Take 1 tablet by mouth daily.   Yes [provider]  metFORMIN (GLUCOPHAGE) 500 MG tablet TAKE 1 TABLET DAILY 01/25/22  Yes Tonia Ghent, MD  Multiple Vitamin (MULTIVITAMIN) tablet Take 1 tablet by mouth daily. Reported on 01/07/2016   Yes [provider]  Omega-3 Fatty Acids ([employer] FISH OIL PO) Take 1,000 mg by mouth daily.   Yes [provider]  Red Yeast Rice 600 MG TABS Take 2 tablets (1,200 mg total) by mouth daily. 01/08/22  Yes Tonia Ghent, MD  rosuvastatin (CRESTOR) 20 MG tablet TAKE 1 TABLET EVERY OTHER  DAY IN THE MORNING. NEEDS TO CALL OFFICE TO SCHEDULE YEARLY EXAM 04/12/22  Yes Tonia Ghent, MD  Testosterone 1.62 % GEL Apply 2 Pump topically daily. 05/12/22  Yes [provider]     ALLERGIES:  No Known Allergies   SOCIAL HISTORY:  Social History   Socioeconomic History   Marital status: Married    Spouse name: Not on file   Number of children: Not on file   Years of education: Not on file   Highest education level: Not on file  Occupational History   Not on file  Tobacco Use   Smoking status: Former    Years: 30.00    Types: Cigarettes    Quit date: 12/27/1990    Years since quitting: 31.4   Smokeless tobacco: Never  Vaping Use   Vaping Use: Never used  Substance and Sexual Activity   Alcohol use: Yes    Comment: Rare Socially   Drug use: No   Sexual activity: Yes  Other Topics Concern   Not on file  Social History Narrative   From [place] then [employer]   Married 1971   2 children alive as of March 13, 2017, 1 biological child and one stepchild   One child died 2017-03-13   Social Determinants of Health   Financial Resource Strain: Low Risk  (01/07/2022)   Overall Financial Resource Strain (CARDIA)    Difficulty of Paying Living Expenses: Not hard at all  Food Insecurity: No Food Insecurity (01/07/2022)   Hunger Vital Sign    Worried About Running Out of Food in the Last Year: Never true    [place] in the Last Year: Never  true  Transportation Needs: No Transportation Needs (01/07/2022)   PRAPARE - Hydrologist (Medical): No    Lack of Transportation (Non-Medical): No  Physical Activity: Insufficiently Active (01/07/2022)   Exercise Vital Sign    Days of Exercise per Week: 3 days    Minutes of Exercise per Session: 30 min  Stress: No Stress Concern Present (01/07/2022)   [place]    Feeling of Stress : Not at all  Social Connections: [place] (01/07/2022)   Social Connection and Isolation Panel [NHANES]    Frequency of Communication with Friends and Family: More than three times a week    Frequency of Social Gatherings with Friends and Family: More than three times a week    Attends Religious Services:  More than 4 times per year    Active Member of Clubs or Organizations: Yes    Attends Archivist Meetings: More than 4 times per year    Marital Status: Married  Human resources officer Violence: Not At Risk (01/07/2022)   Humiliation, Afraid, Rape, and Kick questionnaire    Fear of Current or Ex-Partner: No    Emotionally Abused: No    Physically Abused: No    Sexually Abused: No     FAMILY HISTORY:  Family History  Adopted: Yes      VITAL SIGNS:  Temp:  [97.7 F (36.5 C)-98.8 F (37.1 C)] 97.7 F (36.5 C) (06/15 1610) Pulse Rate:  [67-116] 110 (06/15 1610) Resp:  [11-39] 18 (06/15 1610) BP: (87-144)/(69-86) 130/82 (06/15 1610) SpO2:  [85 %-100 %] 91 % (06/15 1610) Weight:  [102 kg] 102 kg (06/15 1610)     Height: 6' (182.9 cm) Weight: 102 kg BMI (Calculated): 30.49   INTAKE/OUTPUT:  06/14 0701 - 06/15 0700 In: 2086.9 [I.V.:1995.8; IV Piggyback:91] Out: 450 [Urine:450]  PHYSICAL EXAM:  Physical Exam Blood pressure 130/82, pulse (!) 110, temperature 97.7 F (36.5 C), temperature source Oral, resp. rate 18, height 6' (1.829 m), weight 102 kg, SpO2 91 %. Last Weight  Most recent  update: 06/10/2022  4:18 PM    Weight  102 kg (224 lb 13.9 oz)             CONSTITUTIONAL: Well developed, and nourished, appropriately responsive and aware without distress.  Uncomfortable. EYES: Sclera non-icteric.   EARS, NOSE, MOUTH AND THROAT: The oropharynx is dry. Oral mucosa is pink and moist.    Hearing is intact to voice.  NECK: Trachea is midline, and there is no jugular venous distension.  LYMPH NODES:  Lymph nodes in the neck are not enlarged. RESPIRATORY:  Normal respiratory effort without pathologic use of accessory muscles. CARDIOVASCULAR: Heart rate at 110 GI: The abdomen is tender, and distended.  Focally more tender in the right lower quadrant without remarkable guarding, also associated epigastric tenderness there were no palpable masses.  MUSCULOSKELETAL:  Symmetrical muscle tone present. SKIN: Skin turgor is normal. No pathologic skin lesions appreciated.  NEUROLOGIC:  Motor and sensation appear grossly normal.  Cranial nerves are grossly without defect. PSYCH:  Alert and oriented to person, place and time. Affect is appropriate for situation.  Data Reviewed I have personally reviewed what is currently available of the patient's imaging, recent labs and medical records.    Labs:     Latest Ref Rng & Units 06/10/2022    3:29 PM 06/10/2022    5:32 AM 06/09/2022    4:15 AM  CBC  WBC 4.0 - 10.5 K/uL 14.2  12.6  11.4   Hemoglobin 13.0 - 17.0 g/dL 14.3  15.4  14.3   Hematocrit 39.0 - 52.0 % 44.1  48.0  43.9   Platelets 150 - 400 K/uL 167  154  157       Latest Ref Rng & Units 06/10/2022    5:32 AM 06/09/2022    4:15 AM 06/08/2022    6:10 PM  CMP  Glucose 70 - 99 mg/dL 126  140    BUN 8 - 23 mg/dL 50  38    Creatinine 0.61 - 1.24 mg/dL 3.95  1.96    Sodium 135 - 145 mmol/L 137  136    Potassium 3.5 - 5.1 mmol/L 5.1  4.8    Chloride 98 - 111 mmol/L 106  100  CO2 22 - 32 mmol/L 21  26    Calcium 8.9 - 10.3 mg/dL 5.7  8.2    Total Protein 6.5 - 8.1 g/dL 5.6   6.1  6.8   Total Bilirubin 0.3 - 1.2 mg/dL 1.7  1.4  1.9   Alkaline Phos 38 - 126 U/L 44  57  54   AST 15 - 41 U/L 593  1,895  1,756   ALT 0 - 44 U/L 374  621  530      Imaging studies:   Last 24 hrs: US RENAL  Result Date: 06/10/2022 CLINICAL DATA:  Acute kidney injury EXAM: RENAL / URINARY TRACT ULTRASOUND COMPLETE COMPARISON:  No prior ultrasound, correlation is made with CT abdomen pelvis and MRI abdomen 06/08/2022 FINDINGS: Right Kidney: Renal measurements: 11.6 x 5.6 x 5.2 cm = volume: 172 mL. Echogenicity within normal limits. No mass or hydronephrosis visualized. Left Kidney: Renal measurements: 12.9 x 5.8 x 5.1 cm = volume: 201 mL. Echogenicity within normal limits. No hydronephrosis visualized. Cystic lesion in the mid left kidney measures up to 4.4 x 4.7 x 5.1 cm, consistent with a simple cyst, as seen on the 06/08/2022 CT and MRI Bladder: Appears normal for degree of bladder distention. Other: Small amount of ascites. IMPRESSION: 1. No acute finding in the kidneys. 2. Small volume ascites. Electronically Signed   By: Merilyn Baba M.D.   On: 06/10/2022 12:21    CLINICAL DATA:  Abnormalities on recent CT evaluation.   EXAM: MRI ABDOMEN WITHOUT AND WITH CONTRAST   TECHNIQUE: Multiplanar multisequence MR imaging of the abdomen was performed both before and after the administration of intravenous contrast.   CONTRAST:  66m GADAVIST GADOBUTROL 1 MMOL/ML IV SOLN   COMPARISON:  Comparison made with CT evaluation of January 08, 2022.   FINDINGS: Lower chest: Incidental imaging of the lung bases without effusion or consolidation.   Hepatobiliary: Multi cystic appearing area in the RIGHT hepatic lobe within the distribution of portal venous thrombus showing variable perfusion in this area measuring 9.5 x 6.4 cm (image 33/13) portal venous thrombus to RIGHT superior and anterior portal venous branches subtending hepatic subsegment VIII and V. there are areas of intrinsic T1  hyperintensity that surround this lesion and are interspersed throughout the lesion.   A second hypovascular lesion is demonstrated in the posterior LEFT hemiliver, lateral segment (image 28/13) also with intrinsic T1 hyperintensity measuring 17 mm.   Perfusional changes in the area related to portal venous thrombus, less conspicuous on delayed phase.   Hepatic cysts elsewhere in the liver.   Mild pericholecystic fluid and edema in the gallbladder wall with mild enhancement of the gallbladder wall.   Mild intrahepatic biliary duct distension. Subtle area of T2 hypointensity along the distal common bile duct up to 2-3 mm is nonspecific on thick section non MRCP sequences. Note that the study was not performed as an MRCP. Signs of peribiliary enhancement in the setting of acute pancreatitis, see below.   Pancreas: Extensive peripancreatic and interstitial pancreatic edema compatible with pancreatitis.   Diffuse edema and developing fluid without focal characteristics tracking throughout the anterior pararenal space and about the peripancreatic fat and lesser sac. The amount of fluid and edema is increased over the short interval since the CT which was performed 8 hours prior to this exam.   Edema blunts the intrinsic T1 signal that is seen in the pancreas. An area of non enhancement is present in the tail of the pancreas. This measures  2.8 x 2.4 cm (image 47/17) an additional area of pancreatic necrosis present in the neck of the pancreas (image 52/17 measuring 2.4 cm in the axial plane. There is no sign of ductal dilation.   Spleen: Splenic vein is patent. Spleen is normal size without focal lesion. Edema tracks towards the spleen in the LEFT upper quadrant, increased since previous imaging as discussed.   Adrenals/Urinary Tract:  Adrenal glands are normal.   Bosniak category I cyst in the lower pole the LEFT kidney measuring 4.7 cm for which no follow-up is recommended in  the absence of localizing symptoms.   No suspicious renal lesion or hydronephrosis.   Stomach/Bowel: Edema of the gastric antrum and duodenum in the setting of acute necrotic and edematous pancreatitis   Vascular/Lymphatic: Portal venous thrombus of anterior RIGHT hepatic portal venous branches. Main portal vein is patent as are LEFT portal branches. SMV is patent and splenic vein is patent. Normal caliber of the abdominal aorta. No adenopathy in the abdomen.   Other: Small volume ascites. Anterior pararenal fluid and edema measuring up to 9.8 x 2.8 cm on the RIGHT is currently nonfocal and is increased in volume also along the LEFT hemiabdomen tracking towards the spleen and throughout the anterior pararenal space.   Musculoskeletal: No suspicious bone lesions identified.   IMPRESSION: 1. Multi cystic area in the RIGHT hepatic lobe following a vascular distribution, affected by portal venous thrombus, in isolation is most suspicious for developing micro abscesses in the setting of poor vascular flow related to portal thrombus and acute necrotic pancreatitis. There is however intrinsic T1 hyperintensity which can be seen in the setting of hemorrhage, mucin or melanin (melanoma, felt unlikely based on distribution and associated findings at this time). Alternative neoplastic considerations remain in the differential due to a second smaller lesion in the LEFT hepatic lobe which is away from the affected vascular distribution, also with intrinsic T1 hyperintensity. For this reason sampling of the area in the RIGHT hepatic lobe may be helpful to exclude underlying malignancy and obtain a sample for microbiologic assessment. Ultimately, 8-12 week follow-up after therapy even if biopsy is negative is suggested to ensure resolution. 2. Acute necrotic and edematous pancreatitis with worsening since previous imaging. Focal areas of necrosis can lead to ductal interruption and for this  reason close follow-up is suggested. 3. Mild intrahepatic biliary duct distension. Subtle area of T2 hypointensity along the distal common bile duct cannot be confirmed as intraductal stone and may simply represent volume averaging but does raise the question of intraductal calculus. Dedicated MRCP may be helpful in the setting of worsening pancreatitis. 4. Gallbladder edema with under distension favored to be secondary to severe acute pancreatitis, attention on follow-up. Could consider HIDA scan as warranted if there is continued concern for concomitant cholecystitis. 5. Increase in anterior pararenal fluid since previous imaging related to worsening of pancreatitis.   These results will be called to the ordering clinician or representative by the Radiologist Assistant, and communication documented in the PACS or [employer].     Electronically Signed   By: Zetta Bills M.D.   On: 06/09/2022 09:10 CLINICAL DATA:  A 76 year old male presents for follow-up of imaging that was acquired earlier on the same date to assess the biliary tree in the setting of acute necrotic pancreatitis. Please see earlier MRI for further details regarding pancreatitis and associated findings.   EXAM: MRI ABDOMEN WITHOUT CONTRAST  (INCLUDING MRCP)   TECHNIQUE: Multiplanar multisequence MR imaging of the  abdomen was performed. Heavily T2-weighted images of the biliary and pancreatic ducts were obtained, and three-dimensional MRCP images were rendered by post processing.   COMPARISON:  MRI of the same date for which this study was done to complete the evaluation of the biliary tree.   FINDINGS: Lower chest: Effusions partially visualized in the lower chest. Not well assessed.   Hepatobiliary: Hepatic findings with hepatic lesions and portal venous thrombosis as well as hepatic cysts better outlined on previous MRI evaluation.   There is sludge in the gallbladder. There is gallbladder  wall edema as outlined on previous imaging.   There is no discrete evidence of choledocholithiasis. The biliary tree is nondilated. The pancreatic duct is nondilated.   Pancreas: Pancreatic edema as seen on recent imaging better displayed on recent MR evaluation performed earlier the same day.   Spleen:  Not well evaluated better evaluated on previous imaging.   Adrenals/Urinary Tract: Similarly with limited assessment on this dedicated MRCP.   Stomach/Bowel: Edema of the stomach and adjacent small bowel also better displayed on recent dedicated abdominal MRI.   Vascular/Lymphatic:  Not well evaluated   Other: Extensive edema throughout the upper abdomen with signs of perihepatic ascites.   Musculoskeletal: Not well evaluated   IMPRESSION: No signs of choledocholithiasis without dilation of the biliary tree. See dedicated abdominal MRI for further details regarding liver and pancreas as well as associated findings of acute necrotic pancreatitis.     Electronically Signed   By: Zetta Bills M.D.   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   Hematuria    Acute pancreatitis    Liver mass, right lobe, abscess vs mass    Portal vein thrombosis    AKI (acute kidney injury) ([place])    Severe sepsis ([facility])    Syncope    History of prostate cancer    HTN (hypertension)    Sore throat 03/19/2022   Strep pharyngitis 03/19/2022   Elevated BP without diagnosis of hypertension 12/29/2021   Bradycardia 07/08/2021   Nephrolithiasis 01/16/2021   Hyperglycemia 12/07/2020   Seborrheic keratosis 06/05/2020   Cough 08/28/2018   Health care maintenance 05/17/2018   Advance care planning 05/17/2018   Medicare annual wellness visit, initial 05/11/2018   Special screening for malignant neoplasms, colon  02/14/2018   Microalbuminuria 04/26/2017   Prostate cancer ([place]) 04/26/2017   Vegetarian diet 04/26/2017   BPH (benign prostatic hyperplasia) 01/07/2016   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   Multinodular goiter 11/30/2011   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  Any pancreatic intervention would be considered at a tertiary care facility, early transfer may alleviate the obstacle of patient portability should additional crises arise.  -Appreciate support by the critical care team.  -Control pain  -IV resuscitation/maintain euvolemia.   - Anticoagulation for portal vein thrombosis, and DVT prophylaxis.   - Foley catheter for monitoring renal function.    Spoke with patient's wife at bedside, considering the gravity of his current condition.  Significant potential for decompensation, cardiovascular events or intubation.      Thank you for the opportunity  to participate in this patient's care.   -- Ronny Bacon, M.D., FACS 06/10/2022, 4:36 PM

## 2022-06-10 NOTE — Consult Note (Signed)
NAME: Xavier Esparza  DOB: 1946-05-12  MRN: [id number]  Date/Time: 06/10/2022 6:43 PM  REQUESTING PROVIDER: Dr.Sreenath Subjective:  REASON FOR CONSULT: pancreatitis with MOF ?History from wife at bedside Xavier Esparza is a 76 y.o. male fairly healthy at baseline, prostate ca ( wife is not aware and says it is just a spot) followed by [facility] after syncope As per his wife he has been having hematuria and went to see his urologist on Monday- HE ws given levaquin 2 tablets in preparation for cystoscopy but no date was given as per his wife and he was to get labs first Monday night patient took '500mg'$  of levaquin and on Tuesday 06/08/22 morning he took another dose- His wife heard a noise and found him on the floor of the kitchen and looked like he had passed out for a few seconds. He woke up and was feeling dizzy and She called EMS. When EMS arrived he was sitting on the edge of the bed and told them he was okay until he took the 2 nd dose of levaquin and felt dizzy after an hour- his bP was low at 76/45 and they gave him epinephrine as they were concerned about an allergic reaction to levaquin causing low BP In the ED b BP 168/87, temp 97.6, HR 59 , sats 95% Wbc 19.8, , hb 15.9, PLT 222 and cr 1.69 In the ED he developed abdominal pain and vomiting and CT abdomen with contrast showed moderate acute pancreatitis . Follow up MRI revealed necrotizing pancreatitis, portal vein thrombosis and liver lesion suggestive of microabscesses. He was started on borad spectrum antibiotics of vanco, cefepime and flagyl which was changed yesterday to zosyn Heparin GTT was started but because of acute hematuria it was stopped. HE had significant transaminitis and was seen by GI. As there was concern for ascending cholangitis, cholelithiasis transfer to [facility] for urgent ERCP was considered  Today patient has developed worsening creatinine- 5.69 multiple specialities were involved and he was transferred to PCU in  ICU I am asked to see the patient for antibiotic management of necrotizing pancreatitis   Past Medical History:  Diagnosis Date   Adopted    Cataract    resolved with surgery   Diabetes mellitus    Dyslipidemia    Erectile dysfunction    Herpes labialis    Upper Lip   Hyperlipidemia    Male hypogonadism    Prostate cancer [facility])    Renal stones    Thyroid nodule    Right    Past Surgical History:  Procedure Laterality Date   CATARACT EXTRACTION, BILATERAL  2017   Dr. Katy Fitch   COLONOSCOPY WITH PROPOFOL N/A 03/09/2018   Procedure: COLONOSCOPY WITH PROPOFOL;  Surgeon: Lin Landsman, MD;  Location: [facility];  Service: Gastroenterology;  Laterality: N/A;   COLONOSCOPY WITH PROPOFOL N/A 03/12/2021   Procedure: COLONOSCOPY WITH PROPOFOL;  Surgeon: Lin Landsman, MD;  Location: [place];  Service: Endoscopy;  Laterality: N/A;  priority 4   CYSTOSCOPY W/ URETERAL STENT PLACEMENT  08/09/2017   Procedure: CYSTOSCOPY WITH RETROGRADE PYELOGRAM/URETERAL STENT PLACEMENT;  Surgeon: Royston Cowper, MD;  Location: [facility] OR[facility];  Service: Urology;;   EXTRACORPOREAL SHOCK WAVE LITHOTRIPSY Left 09/15/2017   Procedure: EXTRACORPOREAL SHOCK WAVE LITHOTRIPSY (ESWL);  Surgeon: Royston Cowper, MD;  Location: [facility] OR[facility];  Service: Urology;  Laterality: Left;   EYE SURGERY Bilateral 2016   cataract extractions   GREEN LIGHT LASER TURP (TRANSURETHRAL RESECTION OF PROSTATE  N/A 04/20/2016   Procedure: GREEN LIGHT LASER TURP (TRANSURETHRAL RESECTION OF PROSTATE;  Surgeon: Royston Cowper, MD;  Location: [facility];  Service: Urology;  Laterality: N/A;   INGUINAL HERNIA REPAIR  01/11/2012   Procedure: HERNIA REPAIR INGUINAL ADULT;  Surgeon: Pedro Earls, MD;  Location: [place];  Service: General;  Laterality: Right;  Open right inguinal hernia repair with mesh   POLYPECTOMY  03/12/2021   Procedure: POLYPECTOMY;  Surgeon: Lin Landsman, MD;  Location: [place];  Service: Endoscopy;;   RADIOACTIVE SEED IMPLANT     PROSTATE   URETEROSCOPY WITH HOLMIUM LASER LITHOTRIPSY Right 08/09/2017   Procedure: URETEROSCOPY WITH HOLMIUM LASER LITHOTRIPSY;  Surgeon: Royston Cowper, MD;  Location: [facility];  Service: Urology;  Laterality: Right;    Social History   Socioeconomic History   Marital status: Married    Spouse name: Not on file   Number of children: Not on file   Years of education: Not on file   Highest education level: Not on file  Occupational History   Not on file  Tobacco Use   Smoking status: Former    Years: 30.00    Types: Cigarettes    Quit date: 12/27/1990    Years since quitting: 31.4   Smokeless tobacco: Never  Vaping Use   Vaping Use: Never used  Substance and Sexual Activity   Alcohol use: Yes    Comment: Rare Socially   Drug use: No   Sexual activity: Yes  Other Topics Concern   Not on file  Social History Narrative   From [place] then [employer]   Married 1971   2 children alive as of 03-25-17, 1 biological child and one stepchild   One child died 03-25-2017   Social Determinants of Health   Financial Resource Strain: Low Risk  (01/07/2022)   Overall Financial Resource Strain (CARDIA)    Difficulty of Paying Living Expenses: Not hard at all  Food Insecurity: No Food Insecurity (01/07/2022)   Hunger Vital Sign    Worried About Running Out of Food in the Last Year: Never true    [place] in the Last Year: Never true  Transportation Needs: No Transportation Needs (01/07/2022)   PRAPARE - Hydrologist (Medical): No    Lack of Transportation (Non-Medical): No  Physical Activity: Insufficiently Active (01/07/2022)   Exercise Vital Sign    Days of Exercise per Week: 3 days    Minutes of Exercise per Session: 30 min  Stress: No Stress Concern Present (01/07/2022)   [place]     Feeling of Stress : Not at all  Social Connections: [place] (01/07/2022)   Social Connection and Isolation Panel [NHANES]    Frequency of Communication with Friends and Family: More than three times a week    Frequency of Social Gatherings with Friends and Family: More than three times a week    Attends Religious Services: More than 4 times per year    Active Member of [employer] or Organizations: Yes    Attends Archivist Meetings: More than 4 times per year    Marital Status: Married  Human resources officer Violence: Not At Risk (01/07/2022)   Humiliation, Afraid, Rape, and Kick questionnaire    Fear of Current or Ex-Partner: No    Emotionally Abused: No    Physically  Abused: No    Sexually Abused: No    Family History  Adopted: Yes   No Known Allergies I? Current Facility-Administered Medications  Medication Dose Route Frequency Provider Last Rate Last Admin   acetaminophen (TYLENOL) tablet 650 mg  650 mg Oral Q6H PRN Athena Masse, MD       Or   acetaminophen (TYLENOL) suppository 650 mg  650 mg Rectal Q6H PRN Athena Masse, MD       [START ON 06/11/2022] Chlorhexidine Gluconate Cloth 2 % PADS 6 each  6 each Topical Q0600 Ralene Muskrat B, MD   6 each at 06/10/22 1614   heparin ADULT infusion 100 units/mL (25000 units/257m)  1,700 Units/hr Intravenous Continuous GDallie Piles RPH 17 mL/hr at 06/10/22 1343 1,700 Units/hr at 06/10/22 1343   HYDROmorphone (DILAUDID) injection 1 mg  1 mg Intravenous Q1H PRN KFlora Lipps MD   1 mg at 06/10/22 1808   insulin aspart (novoLOG) injection 0-15 Units  0-15 Units Subcutaneous Q4H DAthena Masse MD   2 Units at 06/10/22 1630   metoCLOPramide (REGLAN) injection 10 mg  10 mg Intravenous Q8H Sreenath, Sudheer B, MD   10 mg at 06/10/22 1431   oxyCODONE (Oxy IR/ROXICODONE) immediate release tablet 5 mg  5 mg Oral Q4H PRN SRalene MuskratB, MD   5 mg at 06/09/22 0844   piperacillin-tazobactam (ZOSYN) IVPB 3.375 g  3.375 g  Intravenous Q8H [facility]  Stopped at 06/10/22 1830   prochlorperazine (COMPAZINE) injection 10 mg  10 mg Intravenous Q6H PRN SRalene MuskratB, MD   10 mg at 06/10/22 1526   sodium bicarbonate 150 mEq in sterile water 1,150 mL infusion   Intravenous Continuous Sreenath, Sudheer B, MD         Abtx:  Anti-infectives (From admission, onward)    Start     Dose/Rate Route Frequency Ordered Stop   06/09/22 0900  piperacillin-tazobactam (ZOSYN) IVPB 3.375 g        3.375 g 12.5 mL/hr over 240 Minutes Intravenous Every 8 hours 06/09/22 0826     06/09/22 0000  ceFEPIme (MAXIPIME) 2 g in sodium chloride 0.9 % 100 mL IVPB        2 g 200 mL/hr over 30 Minutes Intravenous  Once 06/08/22 2221 06/09/22 0051   06/08/22 2300  metroNIDAZOLE (FLAGYL) IVPB 500 mg  Status:  Discontinued        500 mg 100 mL/hr over 60 Minutes Intravenous Every 12 hours 06/08/22 2221 06/09/22 0812   06/08/22 1345  ceFEPIme (MAXIPIME) 2 g in sodium chloride 0.9 % 100 mL IVPB        2 g 200 mL/hr over 30 Minutes Intravenous  Once 06/08/22 1343 06/08/22 1449   06/08/22 1345  vancomycin (VANCOCIN) IVPB 1000 mg/200 mL premix        1,000 mg 200 mL/hr over 60 Minutes Intravenous  Once 06/08/22 1343 06/08/22 1517   06/08/22 1345  metroNIDAZOLE (FLAGYL) IVPB 500 mg        500 mg 100 mL/hr over 60 Minutes Intravenous  Once 06/08/22 1343 06/08/22 1623       REVIEW OF SYSTEMS:  NA Objective:  VITALS:  BP 108/78   Pulse (!) 105   Temp 97.7 F (36.5 C) (Oral)   Resp 18   Ht 6' (1.829 m)   Wt 102 kg   SpO2 94%   BMI 30.50 kg/m   PHYSICAL EXAM:  General: pt is in some  distress, encephalopathic, can follow some commands like opening his eyes, and saying his name and where he is- Not able to give any history Head: Normocephalic, without obvious abnormality, atraumatic. Eyes: Conjunctivae clear, anicteric sclerae. Pupils are equal ENT Nares normal. No drainage or sinus tenderness. Lips, mucosa, and tongue  normal. No Thrush Neck:  symmetrical, no adenopathy, thyroid: non tender no carotid bruit and no JVD. Lungs: b/l air entry- decreased bases Heart: s1s2 Abdomen:   tender epigastrium Extremities: atraumatic, no cyanosis. No edema. No clubbing Skin: No rashes or lesions. Or bruising Lymph: Cervical, supraclavicular normal. Neurologic: cannot assess in detail Pertinent Labs Lab Results CBC    Component Value Date/Time   WBC 14.2 (H) 06/10/2022 1529   RBC 5.25 06/10/2022 1529   HGB 14.3 06/10/2022 1529   HCT 44.1 06/10/2022 1529   PLT 167 06/10/2022 1529   MCV 84.0 06/10/2022 1529   MCH 27.2 06/10/2022 1529   MCHC 32.4 06/10/2022 1529   RDW 17.3 (H) 06/10/2022 1529   LYMPHSABS 0.7 06/09/2022 0415   MONOABS 1.0 06/09/2022 0415   EOSABS 0.0 06/09/2022 0415   BASOSABS 0.0 06/09/2022 0415       Latest Ref Rng & Units 06/10/2022    3:29 PM 06/10/2022    5:32 AM 06/09/2022    4:15 AM  CMP  Glucose 70 - 99 mg/dL 131  126  140   BUN 8 - 23 mg/dL 67  50  38   Creatinine 0.61 - 1.24 mg/dL 5.29  3.95  1.96   Sodium 135 - 145 mmol/L 137  137  136   Potassium 3.5 - 5.1 mmol/L 4.8  5.1  4.8   Chloride 98 - 111 mmol/L 102  106  100   CO2 22 - 32 mmol/L '23  21  26   '$ Calcium 8.9 - 10.3 mg/dL 5.6  5.7  8.2   Total Protein 6.5 - 8.1 g/dL  5.6  6.1   Total Bilirubin 0.3 - 1.2 mg/dL  1.7  1.4   Alkaline Phos 38 - 126 U/L  44  57   AST 15 - 41 U/L  593  1,895   ALT 0 - 44 U/L  374  621       Microbiology: Recent Results (from the past 240 hour(s))  Blood culture (routine x 2)     Status: None (Preliminary result)   Collection Time: 06/08/22  1:33 PM   Specimen: BLOOD  Result Value Ref Range Status   Specimen Description BLOOD LEFT HAND  Final   Special Requests   Final    BOTTLES DRAWN AEROBIC AND ANAEROBIC Blood Culture results may not be optimal due to an inadequate volume of blood received in culture bottles   Culture   Final    NO GROWTH 2 DAYS Performed at [facility], [address] [place], [place] [address]    Report Status PENDING  Incomplete  Blood culture (routine x 2)     Status: None (Preliminary result)   Collection Time: 06/08/22  2:15 PM   Specimen: BLOOD  Result Value Ref Range Status   Specimen Description BLOOD BLOOD LEFT WRIST  Final   Special Requests   Final    BOTTLES DRAWN AEROBIC AND ANAEROBIC Blood Culture results may not be optimal due to an inadequate volume of blood received in culture bottles   Culture   Final    NO GROWTH 2 DAYS Performed at [facility], [address] [place],  [place] [address]    Report Status PENDING  Incomplete  MRSA Next Gen by PCR, Nasal     Status: None   Collection Time: 06/10/22  4:14 PM   Specimen: Nasal Mucosa; Nasal Swab  Result Value Ref Range Status   MRSA by PCR Next Gen NOT DETECTED NOT DETECTED Final    Comment: (NOTE) The GeneXpert MRSA Assay (FDA approved for NASAL specimens only), is one component of a comprehensive MRSA colonization surveillance program. It is not intended to diagnose MRSA infection nor to guide or monitor treatment for MRSA infections. Test performance is not FDA approved in patients less than 54 years old. Performed at [facility], [address], [place], [place] [address]     IMAGING RESULTS:  I have personally reviewed the films ?multicystic apprearing rt hepatic lobe lesion in the distribution of portal venous thrombus showing variable perfusion in the area measuring 9.5X 6.4 cm Necrotic and edematous pancreas  Impression/Recommendation ?76 yr male who was having hematuria for a few weeks and was followed by urologist and was supposed to have systoscopy in preparation of which he was given levauin- He took 2 doses of levauin within 12 hrs and had syncopal episode and dizziness after that Later developed abdominal pain in the ED  found to have acute  necrotizing pancreas Is this a allergic reaction/idisyncratic reaction to  Levaquin? Or is it not related to levaquin?  Necrotizing pancreatitis - unclear etiology  ? Levaquin ? Gall stones ( none seen in [place]) No alcohol No other new drugs  This pancreatitis is quick in progression and he has portal vein thrombosis and liver leison around the PV suggestive of liver inflammation, phlegmon but not a well defined abscess  AKI- combination of acute pancreatitis, ATN ???contrast induced nephropathy ?? rhabdo  Encephalopathy- multifactorial- HE was alert on admission--AKI and opioids are contributing to it Toxic encephalopathy from pancreatitis  Was hypotensive before presentation but not now On no pressors  Abnormal LFTS- combination of pancreatitis, shock liver ? Ascending cholangitis  Agree with zosyn  Hypocalcemia from acute pancreatitis  EKG- RBBB-prolonged PR  Hematuria on presentation H/o prostate ca  Pt has poor prognosis  Discussed the management with wife in detail and with the care team ? ?Note:  This document was prepared using Dragon voice recognition software and may include unintentional dictation errors.

## 2022-06-10 NOTE — Progress Notes (Addendum)
PROGRESS NOTE    Xavier Esparza  YWV:[id number] DOB: 10/04/1946 DOA: 06/08/2022 PCP: Tonia Ghent, MD    Brief Narrative:  76 y.o. male with medical history significant for DM, BPH, history of prostate cancer, kidney stones status post lithotripsy, negative colonoscopy 2022 (3 benign polyps), who presents to the ED by EMS following a syncopal episode at home while walking to the bathroom.  Wife said she heard a loud thud and went to find him on the floor.  She said he was awake when she got there but he could not help himself up and she called 911.  Patient states he was in his usual state of health but about 2 weeks prior he saw a little blood in his urine similar to when he had his kidney stone.  He saw his urologist yesterday who gave him a prescription for Levaquin.  He otherwise denied feeling unwell previous to the episode.  He denied chest pain, shortness of breath, headache or visual disturbance, one-sided numbness weakness or tingling, nausea or vomiting, abdominal pain, urinary frequency or burning or diarrhea.[facility] reported BP 76/45 and he was treated with IM epinephrine and a 400 mL fluid bolus.  He was awake and alert x4 by arrival.  After his arrival in the emergency room he developed abdominal pain and vomiting.   Initial CT abdomen pelvis with moderate acute pancreatitis no evidence of pancreatic necrosis or pseudocyst.  Follow-up MRI abdomen concerning for possible choledocholithiasis with evidence of necrotizing pancreatitis.  Case discussed with GI Dr. Marius Ditch at [facility].   There was mention of possible portal vein thrombosis.  Heparin GTT was initially started however patient developed suspected acute hematuria so heparin GTT was stopped.  VQ scan ordered and perfusion component will be completed prior to patient transfer.   6/15: Clinical status overall unchanged.  Patient remains with abdominal pain associated with nausea.  Hemodynamically stable.  Mild elevation in white count.   Lipase and LFTs have improved over interval.  Discussed case with GI and general surgery.  Likely that patient's presentation is secondary to necrotizing pancreatitis.  Currently as there is no CBD stone and there is no strong indication for transfer for ERCP.  From a surgical standpoint no intervention is recommended.  There is nothing from an interventional radiology standpoint to drain.  As such we will discontinue transfer and keep patient in [facility].   Assessment & Plan:   Principal Problem:   Severe sepsis ([place]) Active Problems:   Acute pancreatitis   Liver mass, right lobe, abscess vs mass   Portal vein thrombosis   Syncope   AKI (acute kidney injury) ([place])   HTN (hypertension)   Diabetes mellitus without complication (H[facility])   BPH (benign prostatic hyperplasia)   Nephrolithiasis   History of prostate cancer   Necrotizing pancreatitis  Severe necrotizing pancreatitis Portal vein thrombosis Patient presented with intractable nausea and vomiting.  Ill-defined heterogeneous mass in the liver.  MRI and MRCP revealed severe acute necrotizing pancreatitis with associated portal venous thrombosis.  No evidence of CBD stone.  No indication for transfer.  Microabscess noted.  Plan: Complex management.  Will need involvement from multiple consult services.  Consultants engaged include general surgery, IR, nephrology, ICU.  GI following and appreciate involvement in care.  Continue aggressive intravenous fluid resuscitation.  Continue n.p.o. status for now.  IV antiemetics, IV pain control.  Continue IV antibiotics for now close monitoring.  Will require stepdown bed and intensivist consult.  Patient high risk for  decompensation  We will attempt heparin GTT.  Close monitoring of urine output for hematuria  Acute kidney injury Significant and in the setting of aggressive fluid resuscitation.  AKI likely secondary to ATN in the setting of severe acute pancreatitis Plan: Aggressive IV fluid  resuscitation Daily labs Renal ultrasound Complete UA Nephrology consult  Syncopal event Suspect secondary to hypotension versus vasovagal event PE ruled out, VQ scan low probability Continue telemetry monitoring  Hematuria History of nephrolithiasis History history of BPH History of prostate cancer No evidence of urinary retention despite worsening kidney function Bladder scans every 8 hours Low threshold for Foley catheterization if need be  Diabetes mellitus without complication Sliding-scale coverage    DVT prophylaxis: Heparin GTT Code Status: Full Family Communication: Wife at bedside 6/15 Disposition Plan: Status is: Inpatient Remains inpatient appropriate because: Severe acute pancreatitis   Level of care: Stepdown  Consultants:  ICU GI General surgery ID Nephrology  Procedures:  None  Antimicrobials: Zosyn   Subjective: Patient seen and examined.  Sitting up in bed.  Appears uncomfortable mildly.  Endorsing right lower quadrant abdominal pain.  Objective: Vitals:   06/10/22 0747 06/10/22 0748 06/10/22 0755 06/10/22 1400  BP:   100/72 112/80  Pulse:    98  Resp: (!) 22 (!) '25 17 18  '$ Temp:   98.8 F (37.1 C) 98.6 F (37 C)  TempSrc:   Oral Oral  SpO2:    100%  Weight:      Height:        Intake/Output Summary (Last 24 hours) at 06/10/2022 1532 Last data filed at 06/10/2022 1000 Gross per 24 hour  Intake 1041.03 ml  Output 250 ml  Net 791.03 ml   Filed Weights   06/08/22 1046  Weight: 96.6 kg    Examination:  General exam: Appears uncomfortable Respiratory system: Clear to auscultation. Respiratory effort normal. Cardiovascular system: S1-S2, RRR, no murmurs, no pedal edema Gastrointestinal system: Soft, nondistended, mild TTP right lower quadrant, normal bowel sounds Central nervous system: Alert and oriented. No focal neurological deficits. Extremities: Symmetric 5 x 5 power. Skin: No rashes, lesions or ulcers Psychiatry:  Judgement and insight appear normal. Mood & affect appropriate.     Data Reviewed: I have personally reviewed following labs and imaging studies  CBC: Recent Labs  Lab 06/08/22 1057 06/09/22 0415 06/10/22 0532  WBC 19.8* 11.4* 12.6*  NEUTROABS 15.6* 9.7*  --   HGB 15.9 14.3 15.4  HCT 50.2 43.9 48.0  MCV 85.5 84.1 83.8  PLT 222 157 371   Basic Metabolic Panel: Recent Labs  Lab 06/08/22 1201 06/08/22 1253 06/09/22 0415 06/10/22 0532  NA 137  --  136 137  K HEMOLYZED SPECIMEN UNABLE TO OBTAIN RESULT 3.4* 4.8 5.1  CL 96*  --  100 106  CO2 25  --  26 21*  GLUCOSE 160*  --  140* 126*  BUN 22  --  38* 50*  CREATININE 1.69*  --  1.96* 3.95*  CALCIUM 9.6  --  8.2* 5.7*   GFR: Estimated Creatinine Clearance: 18.9 mL/min (A) (by C-G formula based on SCr of 3.95 mg/dL (H)). Liver Function Tests: Recent Labs  Lab 06/08/22 1810 06/09/22 0415 06/10/22 0532  AST 1,756* 1,895* 593*  ALT 530* 621* 374*  ALKPHOS 54 57 44  BILITOT 1.9* 1.4* 1.7*  PROT 6.8 6.1* 5.6*  ALBUMIN 3.6 3.4* 2.8*   Recent Labs  Lab 06/08/22 1810 06/09/22 0415 06/10/22 0532  LIPASE 1,633* 1,741* 688*   No  results for input(s): "AMMONIA" in the last 168 hours. Coagulation Profile: Recent Labs  Lab 06/08/22 2028  INR 1.1   Cardiac Enzymes: No results for input(s): "CKTOTAL", "CKMB", "CKMBINDEX", "TROPONINI" in the last 168 hours. BNP (last 3 results) No results for input(s): "PROBNP" in the last 8760 hours. HbA1C: No results for input(s): "HGBA1C" in the last 72 hours. CBG: Recent Labs  Lab 06/09/22 0727 06/09/22 1110 06/09/22 1601 06/10/22 0738 06/10/22 1104  GLUCAP 127* 122* 111* 143* 139*   Lipid Profile: No results for input(s): "CHOL", "HDL", "LDLCALC", "TRIG", "CHOLHDL", "LDLDIRECT" in the last 72 hours. Thyroid Function Tests: No results for input(s): "TSH", "T4TOTAL", "FREET4", "T3FREE", "THYROIDAB" in the last 72 hours. Anemia Panel: No results for input(s): "VITAMINB12",  "FOLATE", "FERRITIN", "TIBC", "IRON", "RETICCTPCT" in the last 72 hours. Sepsis Labs: Recent Labs  Lab 06/08/22 1316 06/08/22 1708 06/09/22 0415 06/10/22 0532 06/10/22 0938  PROCALCITON  --   --  6.81 19.36  --   LATICACIDVEN 5.6* 5.5*  --  2.9* 3.5*    Recent Results (from the past 240 hour(s))  Blood culture (routine x 2)     Status: None (Preliminary result)   Collection Time: 06/08/22  1:33 PM   Specimen: BLOOD  Result Value Ref Range Status   Specimen Description BLOOD LEFT HAND  Final   Special Requests   Final    BOTTLES DRAWN AEROBIC AND ANAEROBIC Blood Culture results may not be optimal due to an inadequate volume of blood received in culture bottles   Culture   Final    NO GROWTH 2 DAYS Performed at [facility], [address] [place], [place] [address]    Report Status PENDING  Incomplete  Blood culture (routine x 2)     Status: None (Preliminary result)   Collection Time: 06/08/22  2:15 PM   Specimen: BLOOD  Result Value Ref Range Status   Specimen Description BLOOD BLOOD LEFT WRIST  Final   Special Requests   Final    BOTTLES DRAWN AEROBIC AND ANAEROBIC Blood Culture results may not be optimal due to an inadequate volume of blood received in culture bottles   Culture   Final    NO GROWTH 2 DAYS Performed at [facility], [address] [place], [place] [address]    Report Status PENDING  Incomplete         Radiology Studies: US RENAL  Result Date: 06/10/2022 CLINICAL DATA:  Acute kidney injury EXAM: RENAL / URINARY TRACT ULTRASOUND COMPLETE COMPARISON:  No prior ultrasound, correlation is made with CT abdomen pelvis and MRI abdomen 06/08/2022 FINDINGS: Right Kidney: Renal measurements: 11.6 x 5.6 x 5.2 cm = volume: 172 mL. Echogenicity within normal limits. No mass or hydronephrosis visualized. Left Kidney: Renal measurements: 12.9 x 5.8 x 5.1 cm = volume: 201 mL. Echogenicity within normal limits. No hydronephrosis visualized. Cystic  lesion in the mid left kidney measures up to 4.4 x 4.7 x 5.1 cm, consistent with a simple cyst, as seen on the 06/08/2022 CT and MRI Bladder: Appears normal for degree of bladder distention. Other: Small amount of ascites. IMPRESSION: 1. No acute finding in the kidneys. 2. Small volume ascites. Electronically Signed   By: Merilyn Baba M.D.   On: 06/10/2022 12:21   ECHOCARDIOGRAM COMPLETE  Result Date: 06/09/2022    ECHOCARDIOGRAM REPORT   Patient Name:   Xavier Esparza Date of Exam: 06/09/2022 Medical Rec #:  [id number]    Height:  72.0 in Accession #:    [id number]   Weight:       213.0 lb Date of Birth:  Feb 13, 1946    BSA:          2.188 m Patient Age:    15 years     BP:           139/86 mmHg Patient Gender: M            HR:           74 bpm. Exam Location:  [facility] Procedure: 2D Echo, Color Doppler and Cardiac Doppler Indications:     R55 Syncope  History:         Patient has no prior history of Echocardiogram examinations.                  Risk Factors:Diabetes and Dyslipidemia.  Sonographer:     Charmayne Sheer Referring Phys:  [id number] Athena Masse Diagnosing Phys: Kate Sable MD  Sonographer Comments: Suboptimal apical window and suboptimal subcostal window. IMPRESSIONS  1. Left ventricular ejection fraction, by estimation, is 60 to 65%. The left ventricle has normal function. The left ventricle has no regional wall motion abnormalities. There is mild left ventricular hypertrophy. Left ventricular diastolic parameters are consistent with Grade I diastolic dysfunction (impaired relaxation).  2. Right ventricular systolic function is normal. The right ventricular size is normal.  3. The mitral valve is normal in structure. Mild mitral valve regurgitation.  4. The aortic valve is tricuspid. Aortic valve regurgitation is not visualized. Aortic valve sclerosis is present, with no evidence of aortic valve stenosis.  5. Aortic dilatation noted. There is borderline dilatation of the aortic root, measuring 38  mm.  6. The inferior vena cava is normal in size with greater than 50% respiratory variability, suggesting right atrial pressure of 3 mmHg. FINDINGS  Left Ventricle: Left ventricular ejection fraction, by estimation, is 60 to 65%. The left ventricle has normal function. The left ventricle has no regional wall motion abnormalities. The left ventricular internal cavity size was normal in size. There is  mild left ventricular hypertrophy. Left ventricular diastolic parameters are consistent with Grade I diastolic dysfunction (impaired relaxation). Right Ventricle: The right ventricular size is normal. No increase in right ventricular wall thickness. Right ventricular systolic function is normal. Left Atrium: Left atrial size was normal in size. Right Atrium: Right atrial size was normal in size. Pericardium: There is no evidence of pericardial effusion. Mitral Valve: The mitral valve is normal in structure. Mild mitral valve regurgitation. MV peak gradient, 4.0 mmHg. The mean mitral valve gradient is 1.0 mmHg. Tricuspid Valve: The tricuspid valve is normal in structure. Tricuspid valve regurgitation is trivial. Aortic Valve: The aortic valve is tricuspid. Aortic valve regurgitation is not visualized. Aortic valve sclerosis is present, with no evidence of aortic valve stenosis. Aortic valve mean gradient measures 3.0 mmHg. Aortic valve peak gradient measures 7.2  mmHg. Aortic valve area, by VTI measures 2.69 cm. Pulmonic Valve: The pulmonic valve was not well visualized. Pulmonic valve regurgitation is not visualized. Aorta: Aortic dilatation noted. There is borderline dilatation of the aortic root, measuring 38 mm. Venous: The inferior vena cava is normal in size with greater than 50% respiratory variability, suggesting right atrial pressure of 3 mmHg. IAS/Shunts: No atrial level shunt detected by color flow Doppler.  LEFT VENTRICLE PLAX 2D LVIDd:         4.48 cm   Diastology LVIDs:  3.25 cm   LV e' medial:     6.42 cm/s LV PW:         1.07 cm   LV E/e' medial:  6.8 LV IVS:        1.01 cm   LV e' lateral:   7.83 cm/s LVOT diam:     2.20 cm   LV E/e' lateral: 5.6 LV SV:         53 LV SV Index:   24 LVOT Area:     3.80 cm  RIGHT VENTRICLE RV Basal diam:  2.30 cm LEFT ATRIUM             Index        RIGHT ATRIUM           Index LA diam:        3.90 cm 1.78 cm/m   RA Area:     11.40 cm LA Vol (A2C):   31.8 ml 14.53 ml/m  RA Volume:   21.60 ml  9.87 ml/m LA Vol (A4C):   30.7 ml 14.03 ml/m LA Biplane Vol: 32.9 ml 15.03 ml/m  AORTIC VALVE                    PULMONIC VALVE AV Area (Vmax):    2.74 cm     PV Vmax:       0.98 m/s AV Area (Vmean):   2.84 cm     PV Vmean:      70.200 cm/s AV Area (VTI):     2.69 cm     PV VTI:        0.136 m AV Vmax:           134.00 cm/s  PV Peak grad:  3.9 mmHg AV Vmean:          84.600 cm/s  PV Mean grad:  2.0 mmHg AV VTI:            0.198 m AV Peak Grad:      7.2 mmHg AV Mean Grad:      3.0 mmHg LVOT Vmax:         96.60 cm/s LVOT Vmean:        63.300 cm/s LVOT VTI:          0.140 m LVOT/AV VTI ratio: 0.71  AORTA Ao Root diam: 3.80 cm MITRAL VALVE MV Area (PHT): 2.25 cm    SHUNTS MV Area VTI:   2.66 cm    Systemic VTI:  0.14 m MV Peak grad:  4.0 mmHg    Systemic Diam: 2.20 cm MV Mean grad:  1.0 mmHg MV Vmax:       1.00 m/s MV Vmean:      52.6 cm/s MV Decel Time: 337 msec MV E velocity: 43.70 cm/s MV A velocity: 97.30 cm/s MV E/A ratio:  0.45 Kate Sable MD Electronically signed by Kate Sable MD Signature Date/Time: 06/09/2022/6:43:28 PM    Final    MR ABDOMEN MRCP WO CONTRAST  Result Date: 06/09/2022 CLINICAL DATA:  A 76 year old male presents for follow-up of imaging that was acquired earlier on the same date to assess the biliary tree in the setting of acute necrotic pancreatitis. Please see earlier MRI for further details regarding pancreatitis and associated findings. EXAM: MRI ABDOMEN WITHOUT CONTRAST  (INCLUDING MRCP) TECHNIQUE: Multiplanar multisequence MR imaging  of the abdomen was performed. Heavily T2-weighted images of the biliary and pancreatic ducts were obtained, and three-dimensional MRCP images were rendered by post  processing. COMPARISON:  MRI of the same date for which this study was done to complete the evaluation of the biliary tree. FINDINGS: Lower chest: Effusions partially visualized in the lower chest. Not well assessed. Hepatobiliary: Hepatic findings with hepatic lesions and portal venous thrombosis as well as hepatic cysts better outlined on previous MRI evaluation. There is sludge in the gallbladder. There is gallbladder wall edema as outlined on previous imaging. There is no discrete evidence of choledocholithiasis. The biliary tree is nondilated. The pancreatic duct is nondilated. Pancreas: Pancreatic edema as seen on recent imaging better displayed on recent MR evaluation performed earlier the same day. Spleen:  Not well evaluated better evaluated on previous imaging. Adrenals/Urinary Tract: Similarly with limited assessment on this dedicated MRCP. Stomach/Bowel: Edema of the stomach and adjacent small bowel also better displayed on recent dedicated abdominal MRI. Vascular/Lymphatic:  Not well evaluated Other: Extensive edema throughout the upper abdomen with signs of perihepatic ascites. Musculoskeletal: Not well evaluated IMPRESSION: No signs of choledocholithiasis without dilation of the biliary tree. See dedicated abdominal MRI for further details regarding liver and pancreas as well as associated findings of acute necrotic pancreatitis. Electronically Signed   By: Zetta Bills M.D.   On: 06/09/2022 15:54   MR 3D Recon At Scanner  Result Date: 06/09/2022 CLINICAL DATA:  A 76 year old male presents for follow-up of imaging that was acquired earlier on the same date to assess the biliary tree in the setting of acute necrotic pancreatitis. Please see earlier MRI for further details regarding pancreatitis and associated findings. EXAM: MRI  ABDOMEN WITHOUT CONTRAST  (INCLUDING MRCP) TECHNIQUE: Multiplanar multisequence MR imaging of the abdomen was performed. Heavily T2-weighted images of the biliary and pancreatic ducts were obtained, and three-dimensional MRCP images were rendered by post processing. COMPARISON:  MRI of the same date for which this study was done to complete the evaluation of the biliary tree. FINDINGS: Lower chest: Effusions partially visualized in the lower chest. Not well assessed. Hepatobiliary: Hepatic findings with hepatic lesions and portal venous thrombosis as well as hepatic cysts better outlined on previous MRI evaluation. There is sludge in the gallbladder. There is gallbladder wall edema as outlined on previous imaging. There is no discrete evidence of choledocholithiasis. The biliary tree is nondilated. The pancreatic duct is nondilated. Pancreas: Pancreatic edema as seen on recent imaging better displayed on recent MR evaluation performed earlier the same day. Spleen:  Not well evaluated better evaluated on previous imaging. Adrenals/Urinary Tract: Similarly with limited assessment on this dedicated MRCP. Stomach/Bowel: Edema of the stomach and adjacent small bowel also better displayed on recent dedicated abdominal MRI. Vascular/Lymphatic:  Not well evaluated Other: Extensive edema throughout the upper abdomen with signs of perihepatic ascites. Musculoskeletal: Not well evaluated IMPRESSION: No signs of choledocholithiasis without dilation of the biliary tree. See dedicated abdominal MRI for further details regarding liver and pancreas as well as associated findings of acute necrotic pancreatitis. Electronically Signed   By: Zetta Bills M.D.   On: 06/09/2022 15:54   NM Pulmonary Perfusion  Result Date: 06/09/2022 CLINICAL DATA:  Chest pain EXAM: NUCLEAR MEDICINE PERFUSION LUNG SCAN TECHNIQUE: Perfusion images were obtained in multiple projections after intravenous injection of radiopharmaceutical. Ventilation  scans intentionally deferred if perfusion scan and chest x-ray adequate for interpretation during COVID 19 epidemic. RADIOPHARMACEUTICALS:  3.96 mCi Tc-86mMAA IV COMPARISON:  Chest x-ray dated June 08, 2022 FINDINGS: No large segmental perfusion defects. Small subsegmental perfusion defects seen in the right lung. IMPRESSION: Nondiagnostic (low or intermediate  probability) Electronically Signed   By: Yetta Glassman M.D.   On: 06/09/2022 14:25   MR ABDOMEN W WO CONTRAST  Result Date: 06/09/2022 CLINICAL DATA:  Abnormalities on recent CT evaluation. EXAM: MRI ABDOMEN WITHOUT AND WITH CONTRAST TECHNIQUE: Multiplanar multisequence MR imaging of the abdomen was performed both before and after the administration of intravenous contrast. CONTRAST:  43m GADAVIST GADOBUTROL 1 MMOL/ML IV SOLN COMPARISON:  Comparison made with CT evaluation of January 08, 2022. FINDINGS: Lower chest: Incidental imaging of the lung bases without effusion or consolidation. Hepatobiliary: Multi cystic appearing area in the RIGHT hepatic lobe within the distribution of portal venous thrombus showing variable perfusion in this area measuring 9.5 x 6.4 cm (image 33/13) portal venous thrombus to RIGHT superior and anterior portal venous branches subtending hepatic subsegment VIII and V. there are areas of intrinsic T1 hyperintensity that surround this lesion and are interspersed throughout the lesion. A second hypovascular lesion is demonstrated in the posterior LEFT hemiliver, lateral segment (image 28/13) also with intrinsic T1 hyperintensity measuring 17 mm. Perfusional changes in the area related to portal venous thrombus, less conspicuous on delayed phase. Hepatic cysts elsewhere in the liver. Mild pericholecystic fluid and edema in the gallbladder wall with mild enhancement of the gallbladder wall. Mild intrahepatic biliary duct distension. Subtle area of T2 hypointensity along the distal common bile duct up to 2-3 mm is nonspecific on  thick section non MRCP sequences. Note that the study was not performed as an MRCP. Signs of peribiliary enhancement in the setting of acute pancreatitis, see below. Pancreas: Extensive peripancreatic and interstitial pancreatic edema compatible with pancreatitis. Diffuse edema and developing fluid without focal characteristics tracking throughout the anterior pararenal space and about the peripancreatic fat and lesser sac. The amount of fluid and edema is increased over the short interval since the CT which was performed 8 hours prior to this exam. Edema blunts the intrinsic T1 signal that is seen in the pancreas. An area of non enhancement is present in the tail of the pancreas. This measures 2.8 x 2.4 cm (image 47/17) an additional area of pancreatic necrosis present in the neck of the pancreas (image 52/17 measuring 2.4 cm in the axial plane. There is no sign of ductal dilation. Spleen: Splenic vein is patent. Spleen is normal size without focal lesion. Edema tracks towards the spleen in the LEFT upper quadrant, increased since previous imaging as discussed. Adrenals/Urinary Tract:  Adrenal glands are normal. Bosniak category I cyst in the lower pole the LEFT kidney measuring 4.7 cm for which no follow-up is recommended in the absence of localizing symptoms. No suspicious renal lesion or hydronephrosis. 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 Main portal vein is patent as are LEFT portal branches. SMV is patent and splenic vein is patent. Normal caliber of the abdominal aorta. No adenopathy in the abdomen. Other: Small volume ascites. Anterior pararenal fluid and edema measuring up to 9.8 x 2.8 cm on the RIGHT is currently nonfocal and is increased in volume also along the LEFT hemiabdomen tracking towards the spleen and throughout the anterior  pararenal space. Musculoskeletal: No suspicious bone lesions identified. IMPRESSION: 1. Multi cystic area in the RIGHT hepatic lobe following a vascular distribution, affected by portal venous thrombus, in isolation is most suspicious for developing micro abscesses in the setting of poor vascular flow related to portal thrombus and acute  necrotic pancreatitis. There is however intrinsic T1 hyperintensity which can be seen in the setting of hemorrhage, mucin or melanin (melanoma, felt unlikely based on distribution and associated findings at this time). Alternative neoplastic considerations remain in the differential due to a second smaller lesion in the LEFT hepatic lobe which is away from the affected vascular distribution, also with intrinsic T1 hyperintensity. For this reason sampling of the area in the RIGHT hepatic lobe may be helpful to exclude underlying malignancy and obtain a sample for microbiologic assessment. Ultimately, 8-12 week follow-up after therapy even if biopsy is negative is suggested to ensure resolution. 2. Acute necrotic and edematous pancreatitis with worsening since previous imaging. Focal areas of necrosis can lead to ductal interruption and for this reason close follow-up is suggested. 3. Mild intrahepatic biliary duct distension. Subtle area of T2 hypointensity along the distal common bile duct cannot be confirmed as intraductal stone and may simply represent volume averaging but does raise the question of intraductal calculus. Dedicated MRCP may be helpful in the setting of worsening pancreatitis. 4. Gallbladder edema with under distension favored to be secondary to severe acute pancreatitis, attention on follow-up. Could consider HIDA scan as warranted if there is continued concern for concomitant cholecystitis. 5. Increase in anterior pararenal fluid since previous imaging related to worsening of pancreatitis. These results will be called to the ordering clinician or representative by  the Radiologist Assistant, and communication documented in the PACS or [employer]. Electronically Signed   By: Zetta Bills M.D.   On: 06/09/2022 09:10   US ABDOMEN LIMITED RUQ (LIVER/GB)  Result Date: 06/08/2022 CLINICAL DATA:  Right upper quadrant abdominal pain EXAM: ULTRASOUND ABDOMEN LIMITED RIGHT UPPER QUADRANT COMPARISON:  None Available. FINDINGS: Gallbladder: The gallbladder wall is mildly thickened and there is trace pericholecystic fluid, nonspecific. This can be seen in the setting of acute calculus cholecystitis or inflammatory conditions of the biliary tree, as can be seen with cholangitis or pancreatitis. The gallbladder is not distended. No intraluminal stones or sludge is identified. The sonographic Percell Miller sign is reportedly negative. Common bile duct: Diameter: 2 mm in proximal diameter Liver: There is intrahepatic biliary ductal dilation noted within the right hepatic lobe and within the biliary hilum. There is superimposed heterogeneity of the hepatic echotexture in this region, best appreciated on # [id number] and and ill-defined mixed solid and cystic mass is difficult to exclude. Multiple additional simple cysts are identified within the left hepatic lobe. Portal vein is patent on color Doppler imaging with normal direction of blood flow towards the liver. Other: No ascites IMPRESSION: 1. Mild gallbladder wall thickening with trace pericholecystic fluid, nonspecific. No evidence for cholelithiasis or gallbladder sludge. 2. Intrahepatic biliary ductal dilation within the right hepatic lobe and biliary hilum. There is superimposed heterogeneity of the hepatic echotexture in this region and ill-defined mixed solid and cystic mass is difficult to exclude. Further evaluation with contrast-enhanced MRI is recommended. 3. Multiple additional simple cysts within the left hepatic lobe. Electronically Signed   By: Fidela Salisbury M.D.   On: 06/08/2022 18:17   CT ABDOMEN PELVIS W  CONTRAST  Result Date: 06/08/2022 CLINICAL DATA:  Hematuria.  Hypotension.  Syncopal episode. EXAM: CT ABDOMEN AND PELVIS WITH CONTRAST TECHNIQUE: Multidetector CT imaging of the abdomen and pelvis was performed using the standard protocol following bolus administration of intravenous contrast. RADIATION DOSE REDUCTION: This exam was performed according to the departmental dose-optimization program which includes automated exposure control, adjustment of the mA and/or kV according to  patient size and/or use of iterative reconstruction technique. CONTRAST:  79m OMNIPAQUE IOHEXOL 300 MG/ML  SOLN COMPARISON:  None Available. FINDINGS: Lower Chest: No acute findings. Hepatobiliary: Multiple small benign-appearing hepatic cysts are noted. An ill-defined heterogeneous low-attenuation lesion is seen in the anterior segment the right hepatic lobe which measures approximately 9.1 x 6.4 cm on image 34/2. 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 No other suspicious hepatic masses are identified. Mild diffuse gallbladder wall thickening is seen, which could be secondary to acute pancreatitis although acute cholecystitis cannot definitely be excluded. Pancreas: Moderate acute pancreatitis is seen with diffuse peripancreatic inflammatory changes and small amount of fluid extending inferiorly in the right anterior pararenal space. No evidence of pancreatic necrosis or pseudocysts. No evidence of pancreatic mass. Spleen: Within normal limits in size and appearance. Adrenals/Urinary Tract: 2 small less than 1 cm right renal calculi are seen. No evidence of ureteral calculi or hydronephrosis. 5 cm benign-appearing left renal cyst noted (no followup imaging recommended). No masses identified. Stomach/Bowel: Small hiatal hernia noted. No evidence of obstruction, inflammatory process or abnormal fluid collections. Normal appendix visualized. Diverticulosis is seen  mainly involving the descending and sigmoid colon, however there is no evidence of diverticulitis. Vascular/Lymphatic: No pathologically enlarged lymph nodes. No acute vascular findings. Aortic atherosclerotic calcification incidentally noted. Reproductive: Brachytherapy seeds noted throughout the prostate gland. Otherwise unremarkable. Other:  None. Musculoskeletal:  No suspicious bone lesions identified. IMPRESSION: Moderate acute pancreatitis. No evidence of pancreatic necrosis or pseudocysts. Mild diffuse gallbladder wall thickening likely secondary to acute pancreatitis, although acute cholecystitis cannot definitely be excluded. 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 Differential diagnosis includes neoplasm and abscess. Consider abdomen MRI without and with contrast for further characterization. Right nephrolithiasis. No evidence of ureteral calculi or hydronephrosis. Small hiatal hernia. Colonic diverticulosis, without radiographic evidence of diverticulitis. Aortic Atherosclerosis (ICD10-I70.0). Electronically Signed   By: JMarlaine HindM.D.   On: 06/08/2022 15:55        Scheduled Meds:  insulin aspart  0-15 Units Subcutaneous Q4H   metoCLOPramide (REGLAN) injection  10 mg Intravenous Q8H   Continuous Infusions:  heparin 1,700 Units/hr (06/10/22 1343)   lactated ringers 150 mL/hr at 06/10/22 1235   piperacillin-tazobactam (ZOSYN)  IV 3.375 g (06/10/22 1431)     LOS: 2 days   CRITICAL CARE Performed by: SSidney Ace  Total critical care time: 45 minutes  Critical care time was exclusive of separately billable procedures and treating other patients.  Critical care was necessary to treat or prevent imminent or life-threatening deterioration.  Critical care was time spent personally by me on the following activities: development of treatment plan with patient and/or surrogate as  well as nursing, discussions with consultants, evaluation of patient's response to treatment, examination of patient, obtaining history from patient or surrogate, ordering and performing treatments and interventions, ordering and review of laboratory studies, ordering and review of radiographic studies, pulse oximetry and re-evaluation of patient's condition.     SSidney Ace MD [facility]   If 7PM-7AM, please contact night-coverage  06/10/2022, 3:32 PM

## 2022-06-11 ENCOUNTER — Inpatient Hospital Stay: Payer: Medicare Other

## 2022-06-11 DIAGNOSIS — N179 Acute kidney failure, unspecified: Secondary | ICD-10-CM | POA: Diagnosis not present

## 2022-06-11 DIAGNOSIS — K8502 Idiopathic acute pancreatitis with infected necrosis: Secondary | ICD-10-CM | POA: Diagnosis not present

## 2022-06-11 DIAGNOSIS — R55 Syncope and collapse: Secondary | ICD-10-CM | POA: Diagnosis not present

## 2022-06-11 DIAGNOSIS — R160 Hepatomegaly, not elsewhere classified: Secondary | ICD-10-CM

## 2022-06-11 DIAGNOSIS — Z7189 Other specified counseling: Secondary | ICD-10-CM | POA: Diagnosis not present

## 2022-06-11 DIAGNOSIS — A419 Sepsis, unspecified organism: Secondary | ICD-10-CM | POA: Diagnosis not present

## 2022-06-11 DIAGNOSIS — I81 Portal vein thrombosis: Secondary | ICD-10-CM | POA: Diagnosis not present

## 2022-06-11 DIAGNOSIS — K859 Acute pancreatitis without necrosis or infection, unspecified: Secondary | ICD-10-CM | POA: Diagnosis not present

## 2022-06-11 DIAGNOSIS — K8591 Acute pancreatitis with uninfected necrosis, unspecified: Secondary | ICD-10-CM | POA: Diagnosis not present

## 2022-06-11 LAB — COMPREHENSIVE METABOLIC PANEL
ALT: 218 U/L — ABNORMAL HIGH (ref 0–44)
AST: 262 U/L — ABNORMAL HIGH (ref 15–41)
Albumin: 2.7 g/dL — ABNORMAL LOW (ref 3.5–5.0)
Alkaline Phosphatase: 41 U/L (ref 38–126)
Anion gap: 10 (ref 5–15)
BUN: 78 mg/dL — ABNORMAL HIGH (ref 8–23)
CO2: 26 mmol/L (ref 22–32)
Calcium: 4.3 mg/dL — CL (ref 8.9–10.3)
Chloride: 101 mmol/L (ref 98–111)
Creatinine, Ser: 6.32 mg/dL — ABNORMAL HIGH (ref 0.61–1.24)
GFR, Estimated: 8 mL/min — ABNORMAL LOW (ref >60)
Glucose, Bld: 138 mg/dL — ABNORMAL HIGH (ref 70–99)
Potassium: 4.3 mmol/L (ref 3.5–5.1)
Sodium: 137 mmol/L (ref 135–145)
Total Bilirubin: 1.5 mg/dL — ABNORMAL HIGH (ref 0.3–1.2)
Total Protein: 5.4 g/dL — ABNORMAL LOW (ref 6.5–8.1)

## 2022-06-11 LAB — HEPATITIS B CORE ANTIBODY, TOTAL: Hep B Core Total Ab: NONREACTIVE

## 2022-06-11 LAB — BASIC METABOLIC PANEL
Anion gap: 13 (ref 5–15)
BUN: 85 mg/dL — ABNORMAL HIGH (ref 8–23)
CO2: 25 mmol/L (ref 22–32)
Calcium: <4 mg/dL — CL (ref 8.9–10.3)
Chloride: 99 mmol/L (ref 98–111)
Creatinine, Ser: 6.99 mg/dL — ABNORMAL HIGH (ref 0.61–1.24)
GFR, Estimated: 8 mL/min — ABNORMAL LOW (ref >60)
Glucose, Bld: 131 mg/dL — ABNORMAL HIGH (ref 70–99)
Potassium: 4.2 mmol/L (ref 3.5–5.1)
Sodium: 137 mmol/L (ref 135–145)

## 2022-06-11 LAB — CBC WITH DIFFERENTIAL/PLATELET
Abs Immature Granulocytes: 0.07 10*3/uL (ref 0.00–0.07)
Basophils Absolute: 0 10*3/uL (ref 0.0–0.1)
Basophils Relative: 0 %
Eosinophils Absolute: 0.1 10*3/uL (ref 0.0–0.5)
Eosinophils Relative: 1 %
HCT: 38.8 % — ABNORMAL LOW (ref 39.0–52.0)
Hemoglobin: 12.9 g/dL — ABNORMAL LOW (ref 13.0–17.0)
Immature Granulocytes: 1 %
Lymphocytes Relative: 7 %
Lymphs Abs: 0.9 10*3/uL (ref 0.7–4.0)
MCH: 27.4 pg (ref 26.0–34.0)
MCHC: 33.2 g/dL (ref 30.0–36.0)
MCV: 82.4 fL (ref 80.0–100.0)
Monocytes Absolute: 1 10*3/uL (ref 0.1–1.0)
Monocytes Relative: 8 %
Neutro Abs: 11.2 10*3/uL — ABNORMAL HIGH (ref 1.7–7.7)
Neutrophils Relative %: 83 %
Platelets: 157 10*3/uL (ref 150–400)
RBC: 4.71 MIL/uL (ref 4.22–5.81)
RDW: 16.9 % — ABNORMAL HIGH (ref 11.5–15.5)
WBC Morphology: ABNORMAL
WBC: 13.3 10*3/uL — ABNORMAL HIGH (ref 4.0–10.5)
nRBC: 0 % (ref 0.0–0.2)

## 2022-06-11 LAB — CALCIUM, IONIZED: Calcium, Ionized, Serum: 3.1 mg/dL — ABNORMAL LOW (ref 4.5–5.6)

## 2022-06-11 LAB — GLUCOSE, CAPILLARY
Glucose-Capillary: 136 mg/dL — ABNORMAL HIGH (ref 70–99)
Glucose-Capillary: 140 mg/dL — ABNORMAL HIGH (ref 70–99)
Glucose-Capillary: 146 mg/dL — ABNORMAL HIGH (ref 70–99)
Glucose-Capillary: 147 mg/dL — ABNORMAL HIGH (ref 70–99)
Glucose-Capillary: 147 mg/dL — ABNORMAL HIGH (ref 70–99)
Glucose-Capillary: 157 mg/dL — ABNORMAL HIGH (ref 70–99)

## 2022-06-11 LAB — CK: Total CK: 1740 U/L — ABNORMAL HIGH (ref 49–397)

## 2022-06-11 LAB — HEPARIN LEVEL (UNFRACTIONATED): Heparin Unfractionated: 0.66 IU/mL (ref 0.30–0.70)

## 2022-06-11 LAB — LACTIC ACID, PLASMA: Lactic Acid, Venous: 1.7 mmol/L (ref 0.5–1.9)

## 2022-06-11 LAB — HEPATITIS B SURFACE ANTIBODY,QUALITATIVE: Hep B S Ab: NONREACTIVE

## 2022-06-11 LAB — HEPATITIS B SURFACE ANTIGEN: Hepatitis B Surface Ag: NONREACTIVE

## 2022-06-11 LAB — HEPATITIS C ANTIBODY: HCV Ab: NONREACTIVE

## 2022-06-11 LAB — C-REACTIVE PROTEIN: CRP: 27 mg/dL — ABNORMAL HIGH (ref <1.0)

## 2022-06-11 IMAGING — RF DG NASO G TUBE PLC W/FL W/RAD
1 series · 1 of 1 positions shown · IV contrast (omnipaque)
Comparison: None Available.

CLINICAL DATA: History of prostate cancer, sepsis, abdominal pain,
pancreatitis

EXAM:
NASO G TUBE PLACEMENT WITH FL AND WITH RAD
CONTRAST:  5 mL Omnipaque 300
FLUOROSCOPY:
Radiation Exposure Index (if provided by the fluoroscopic device):
41.4 mGy
Number of Acquired Spot Images: 0

[Series 1: cp_standard · 0.26mm/px · 1 of 1 slices shown]
[im 1/1]
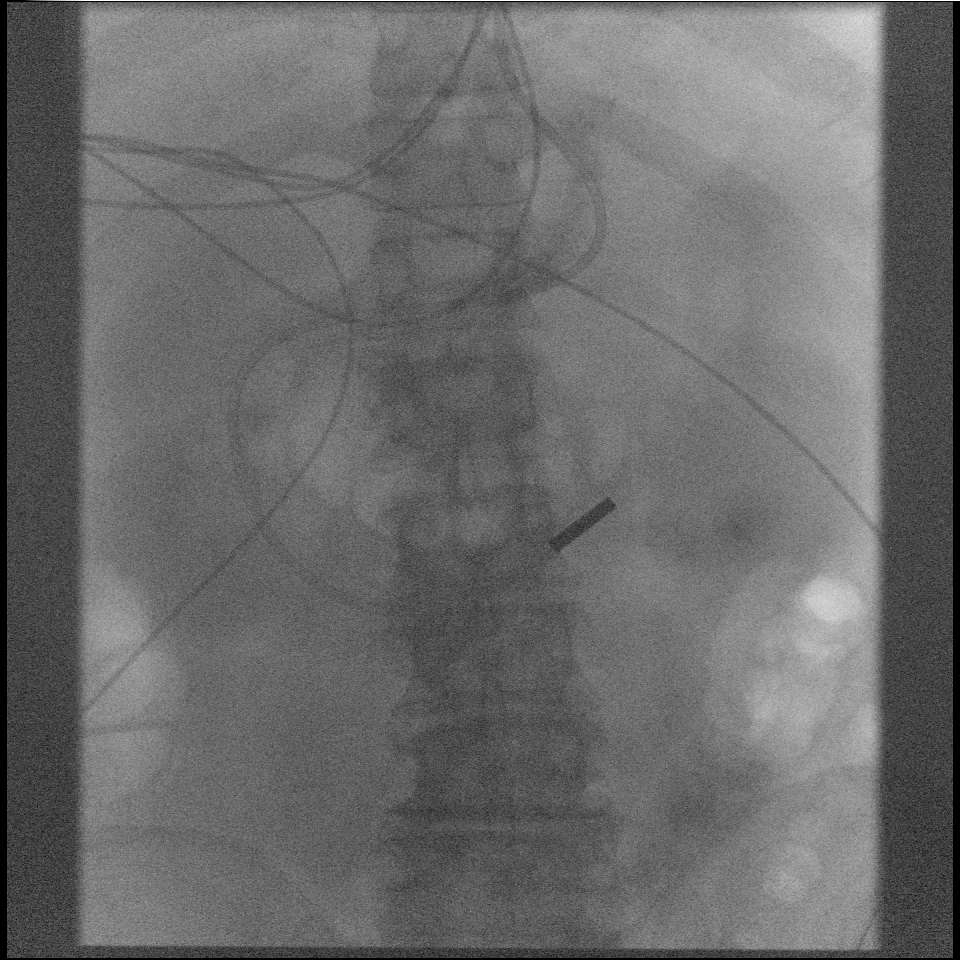

[1 of 1 positions shown; findings below may reference images not displayed]

FINDINGS: Dobbhoff tube was inserted through the right nostril and advanced
into the stomach. Dobbhoff tube was foot the further advanced into
the fourth portion of the duodenum. Stylet was removed and tube was
secured to the skin surface of the nose.

No immediate complications.  Incidentally noted small hiatal hernia.
IMPRESSION: Successful placement of a Dobbhoff tube with the tip located distal
to the pylorus in the fourth portion of the duodenum.

## 2022-06-11 IMAGING — CT CT ABD-PELV W/O CM
2 of 4 series · 14 of 46 positions shown, 16 images · non-contrast
Comparison: Multiple exams, including MRI [DATE] and [DATE]

CLINICAL DATA: Sepsis, abdominal pain. History of prostate cancer.
History of renal calculi. Syncope. Pancreatitis with focal
pancreatic necrosis on [DATE] MRI. Portal vein thrombosis in the
right hepatic lobe with possible abscesses in the liver.



[Series 2: routine abd/pel wo · axial · 0.87mm/px · z∈[-1152,-707]mm · 11 of 107 slices shown, 13 images]
[im 9/107  soft-tissue]
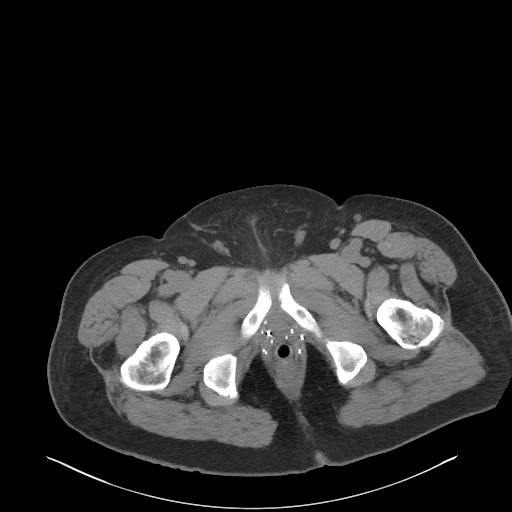
[im 9/107  bone]
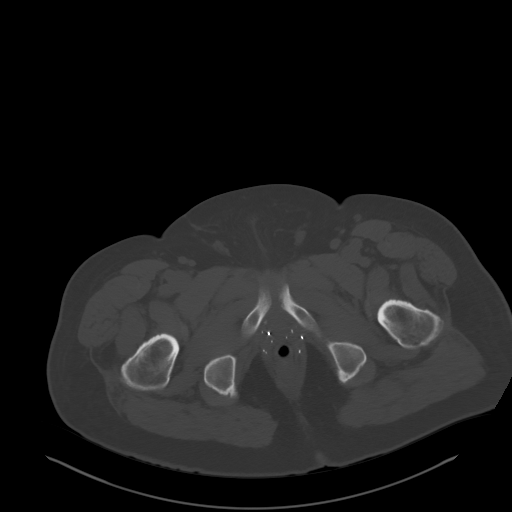
[im 17/107  soft-tissue]
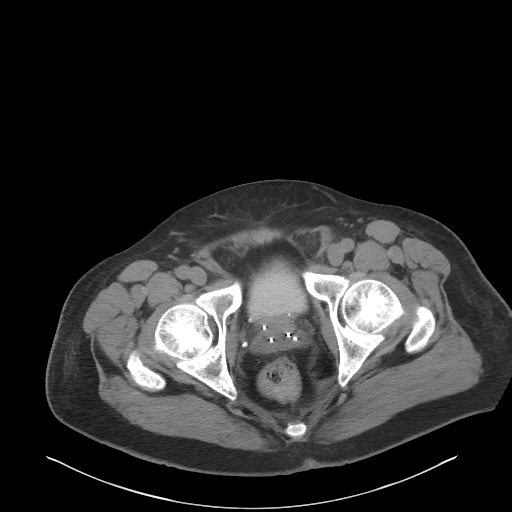
[im 26/107  soft-tissue]
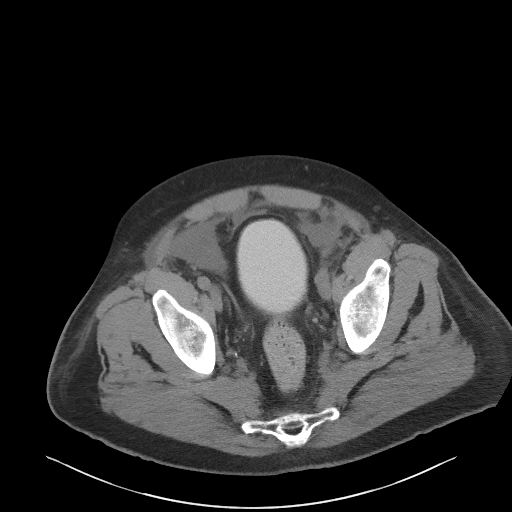
[im 34/107  soft-tissue]
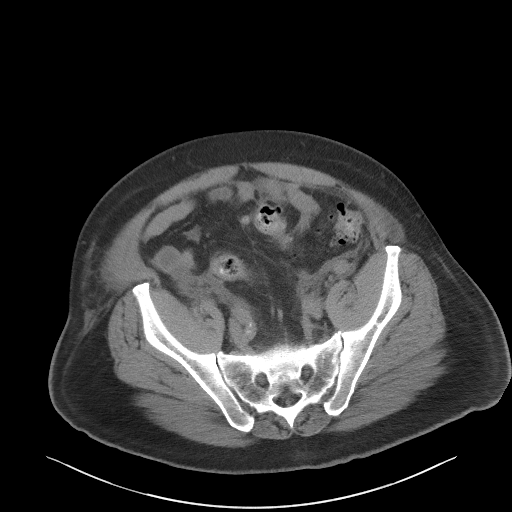
[im 43/107  soft-tissue]
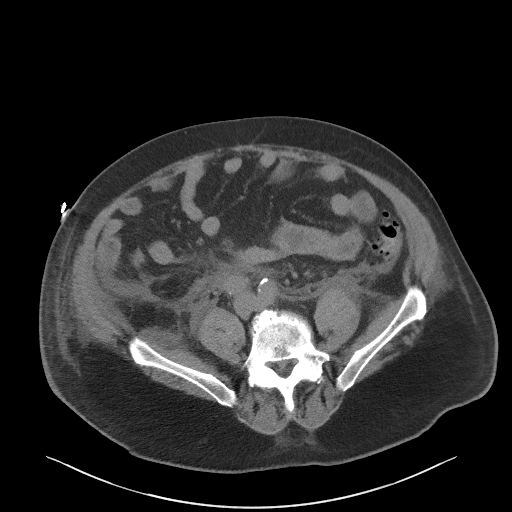
[im 56/107  soft-tissue]
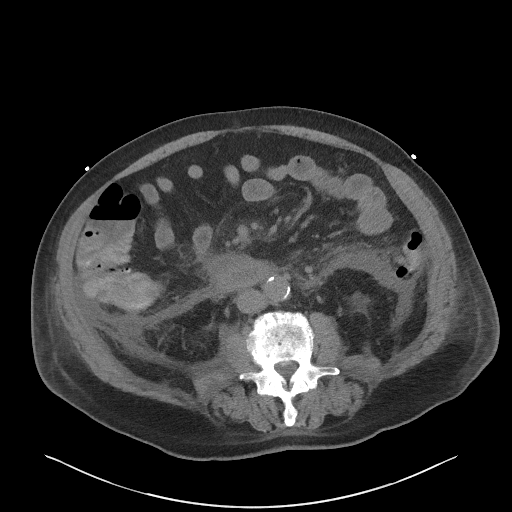
[im 64/107  soft-tissue]
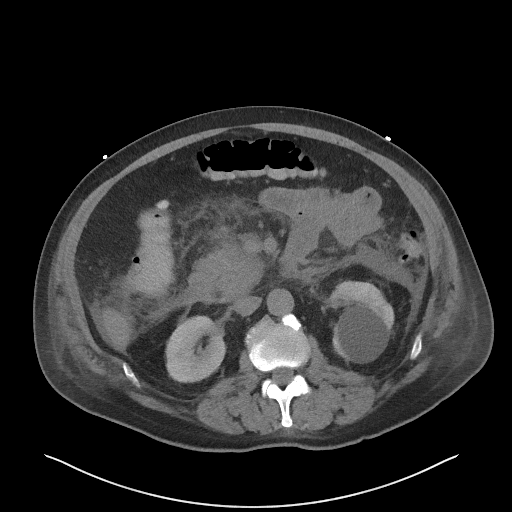
[im 73/107  soft-tissue]
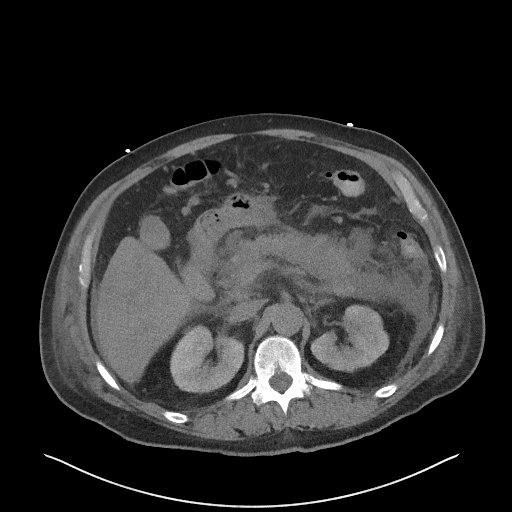
[im 81/107  soft-tissue]
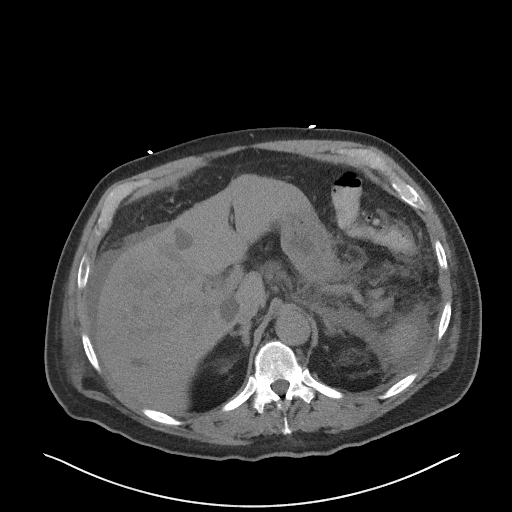
[im 81/107  bone]
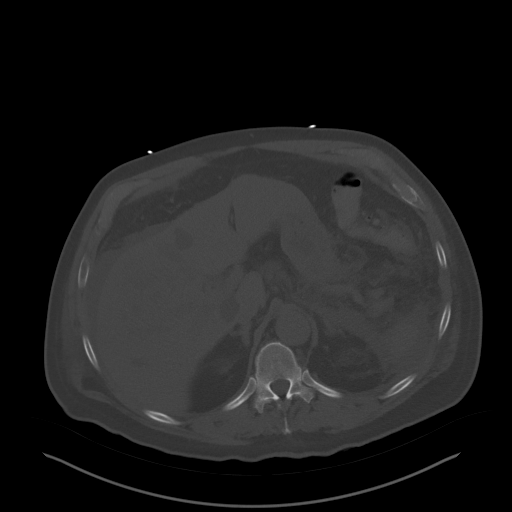
[im 90/107  soft-tissue]
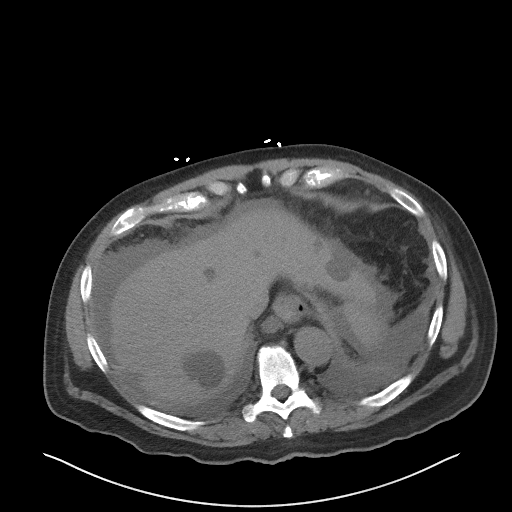
[im 98/107  soft-tissue]
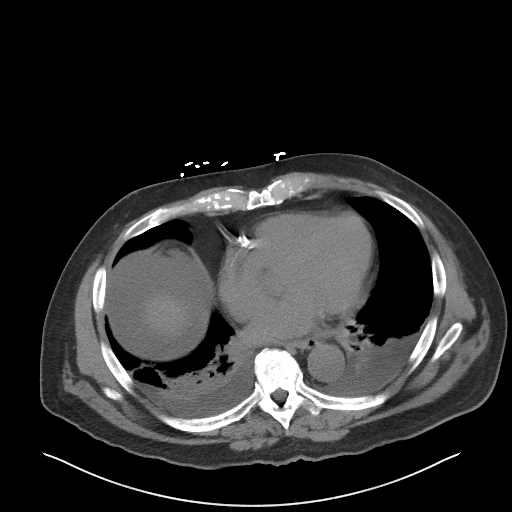

[Series 5: coronal st · coronal · 0.83mm/px · 3 of 105 slices shown]
[im 35/105  soft-tissue]
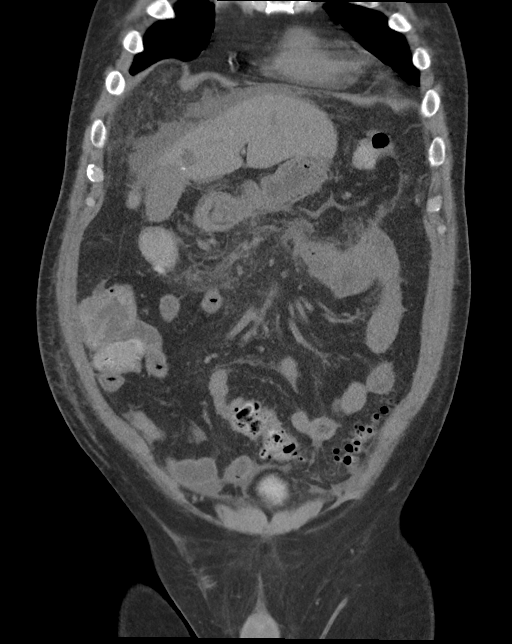
[im 47/105  soft-tissue]
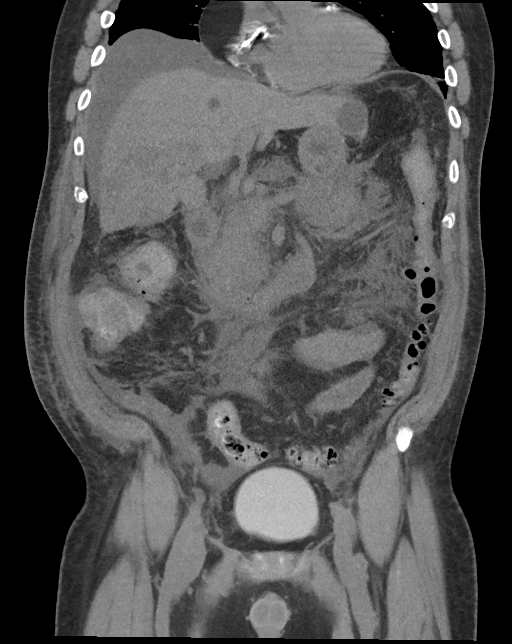
[im 58/105  soft-tissue]
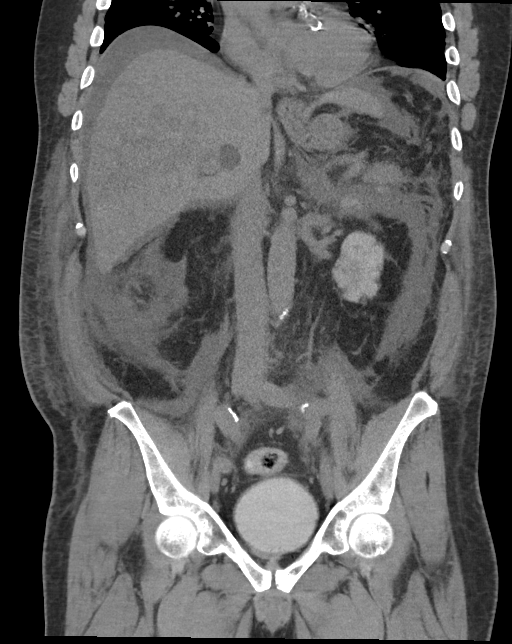

[14 of 46 positions shown; findings below may reference images not displayed]

FINDINGS: Lower chest: Small but increased bilateral pleural effusions with
passive atelectasis. Coronary artery atherosclerotic calcifications.

Hepatobiliary: Heterogeneous density in the right hepatic lobe
corresponding to the prior region of portal vein thrombosis and
suspected micro abscesses. Scattered hepatic fluid density lesions
compatible with cysts throughout the rest of the liver. High density
material in the gallbladder, possibly sludge. Probable sludge in the
nondilated common bile duct.

Pancreas: Substantial peripancreatic edema. No gas density is
identified within the pancreatic parenchyma or previous regions of
pancreatic necrosis to indicate super infection with gas-forming
organism. Acute peripancreatic fluid collections appear increased
and there is increased surrounding mesenteric edema and fluid
tracking along the paracolic gutters and anterior margins the
perirenal fascia.

Spleen: Unremarkable

Adrenals/Urinary Tract: Two nonobstructive right renal calculi are
observed, the larger measuring 0.7 cm in the right kidney lower pole
on image 68 series 5. Benign 4.4 cm left kidney lower pole cyst.

There is substantial contrast medium in the renal parenchyma
bilaterally on today's noncontrast examination, indicating
persistent nephrograms compatible with acute renal failure. This is
likely left over from prior contrast enhanced studies from
[DATE] and possibly [DATE]. Correlate with urine production.

Stomach/Bowel: Small type 1 hiatal hernia. Indistinct descending and
transverse duodenum likely from secondary inflammation. Scattered
colonic diverticula. Descending and sigmoid colon diverticulosis.

Vascular/Lymphatic: Atherosclerosis is present, including aortoiliac
atherosclerotic disease.

Reproductive: Brachytherapy seed implants in the prostate gland.

Other: Increased ascites likely related to the pancreatitis. There
is also increasing presacral edema.

Musculoskeletal: Lumbar spondylosis and degenerative disc disease
causing multilevel impingement.
IMPRESSION: 1. Increased ascites and acute peripancreatic fluid collections. No
gas is identified within the pancreatic parenchyma to suggest
superinfection of the prior pancreatic necrosis with gas-forming
organism at this time. Today's exam is performed without IV contrast
and accordingly assessment for further pancreatic necrosis is not
feasible.
2. Delayed nephrogram with contrast in the kidneys left over from
the studies from 2-3 days ago. Appearance compatible with acute
renal failure.
3. Small but increased bilateral pleural effusions with passive
atelectasis.
4. Roughly similar distribution of heterogeneous density in the
right hepatic lobe corresponding to the prior region of portal vein
thrombosis and suspected microabscesses.
5. Two nonobstructive right renal calculi are observed.
6. Other imaging findings of potential clinical significance: Aortic
Atherosclerosis ([2I]-[2I]). Brachytherapy seed implants in the
prostate gland. Multilevel lumbar impingement. Small type 1 hiatal
hernia. Descending and sigmoid colon diverticulosis. Secondary
inflammation of the descending and transverse duodenum.

## 2022-06-11 IMAGING — DX DG CHEST 1V PORT
1 series · 1 of 1 positions shown · non-contrast
Comparison: Prior chest radiographs [DATE] and earlier. Same
day CT abdomen/pelvis [DATE].
COMPARISON: Prior chest radiographs [DATE] and earlier. Same
day CT abdomen/pelvis [DATE].

Addendum:
CLINICAL DATA: Provided history: Encounter for central line and NG
tube placement.

EXAM:
PORTABLE CHEST 1 VIEW

[chest ap]
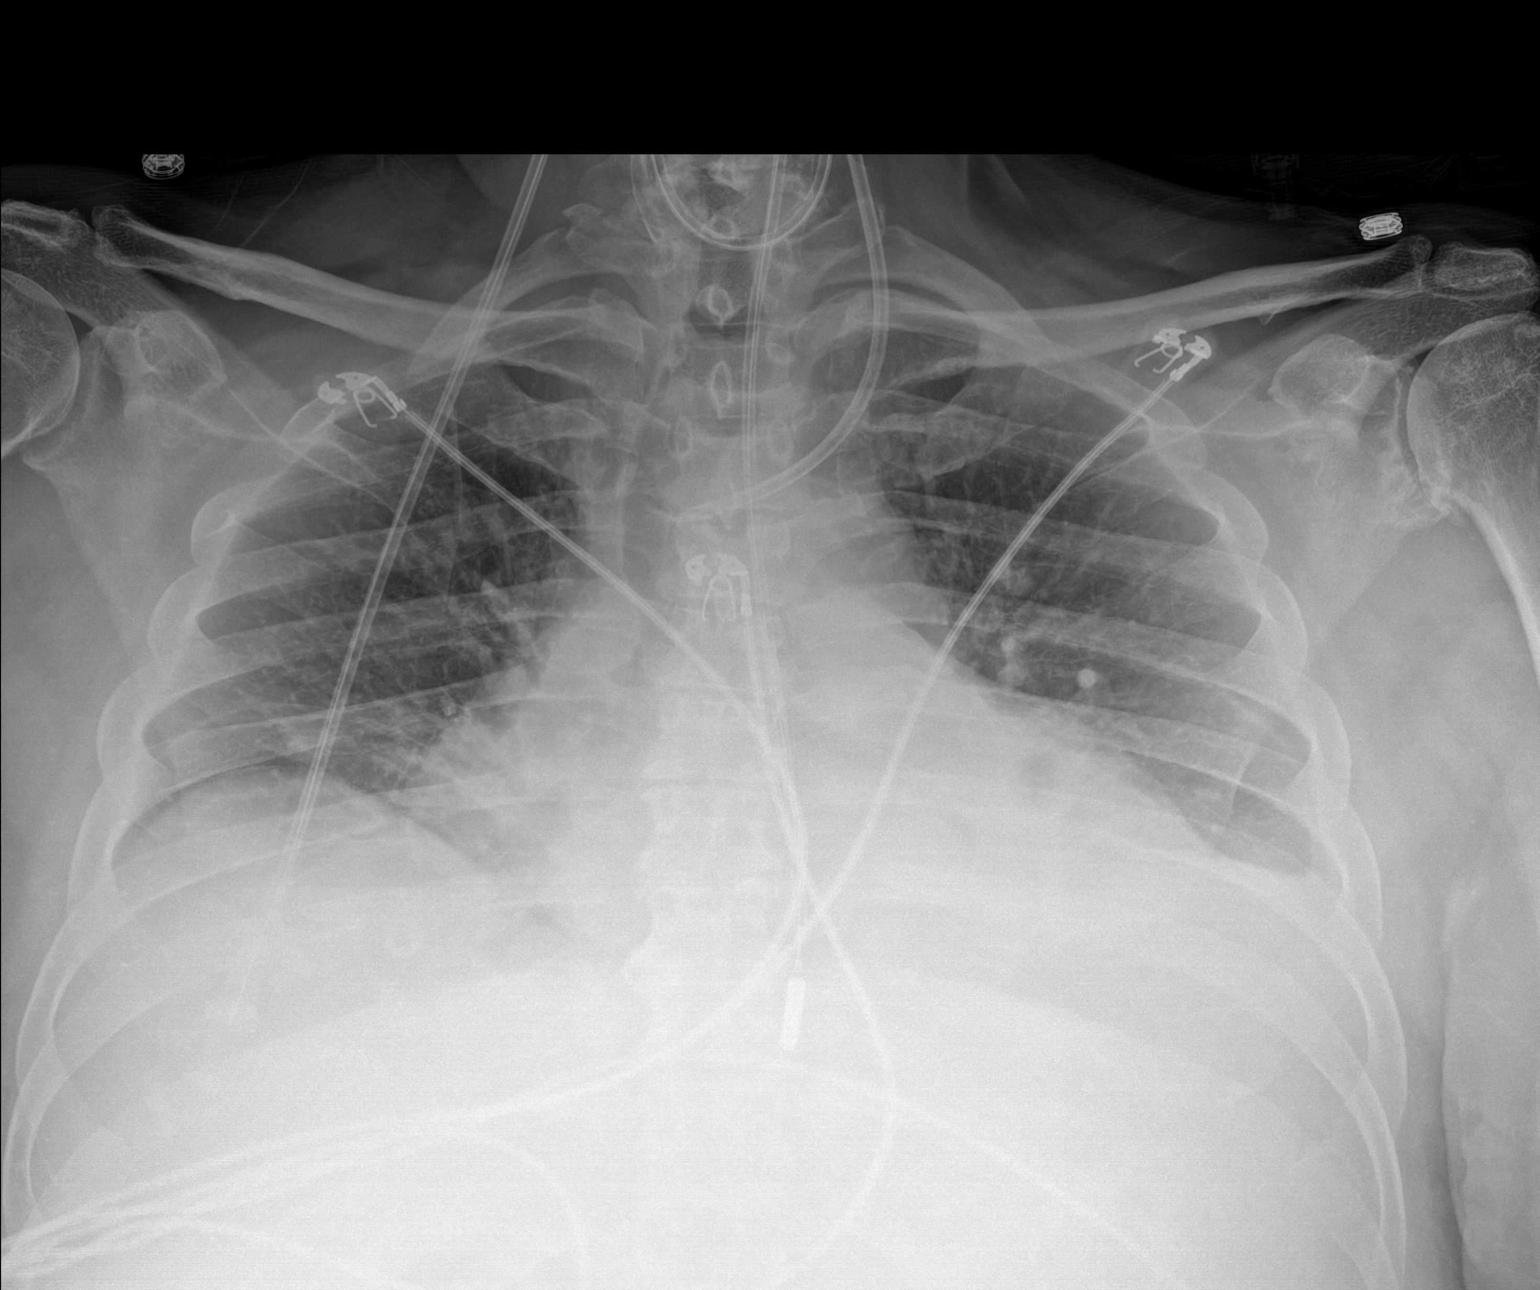

[1 of 1 positions shown; findings below may reference images not displayed]

FINDINGS: Left IJ approach central venous catheter terminating in the expected
location of the brachiocephalic/SVC confluence. An enteric tube is
coiled at the level of the lower neck and terminates at the level of
the distal esophagus.

Shallow inspiration radiograph, accentuating the cardiac silhouette
and limiting evaluation of heart size. Small bilateral pleural
effusions better appreciated on same day CT abdomen/pelvis. Mild
bibasilar atelectasis. No evidence of pneumothorax. No acute bony
abnormality identified. Degenerative changes of the left
glenohumeral joint.

Attempts are being made to reach the ordering provider at this time
regarding impression #1.
IMPRESSION: An enteric tube is coiled at the level of the lower neck and
terminates at the level of the distal esophagus.

Left IJ approach central venous catheter terminating in the expected
location of the brachiocephalic/SVC confluence.

Shallow inspiration radiograph with small bilateral pleural
effusions and bibasilar atelectasis.

ADDENDUM:
Impression #1 called to Dr. BUTLER of the ICU at [DATE] p.m. on
[DATE].

*** End of Addendum ***
FINDINGS: Left IJ approach central venous catheter terminating in the expected
location of the brachiocephalic/SVC confluence. An enteric tube is
coiled at the level of the lower neck and terminates at the level of
the distal esophagus.

Shallow inspiration radiograph, accentuating the cardiac silhouette
and limiting evaluation of heart size. Small bilateral pleural
effusions better appreciated on same day CT abdomen/pelvis. Mild
bibasilar atelectasis. No evidence of pneumothorax. No acute bony
abnormality identified. Degenerative changes of the left
glenohumeral joint.

Attempts are being made to reach the ordering provider at this time
regarding impression #1.
IMPRESSION: An enteric tube is coiled at the level of the lower neck and
terminates at the level of the distal esophagus.

Left IJ approach central venous catheter terminating in the expected
location of the brachiocephalic/SVC confluence.

Shallow inspiration radiograph with small bilateral pleural
effusions and bibasilar atelectasis.

## 2022-06-11 MED ORDER — LORAZEPAM 2 MG/ML IJ SOLN
1.0000 mg | Freq: Once | INTRAMUSCULAR | Status: AC
  Administered 2022-06-11: 1 mg via INTRAVENOUS
  Filled 2022-06-11: qty 1

## 2022-06-11 MED ORDER — LIDOCAINE-PRILOCAINE 2.5-2.5 % EX CREA: 1.0000 | TOPICAL_CREAM | CUTANEOUS | Status: DC | PRN

## 2022-06-11 MED ORDER — PIPERACILLIN-TAZOBACTAM IN DEX 2-0.25 GM/50ML IV SOLN
2.2500 g | Freq: Three times a day (TID) | INTRAVENOUS | Status: DC
  Administered 2022-06-11 – 2022-06-17 (×17): 2.25 g via INTRAVENOUS
  Filled 2022-06-11 (×18): qty 50

## 2022-06-11 MED ORDER — CALCIUM GLUCONATE-NACL 1-0.675 GM/50ML-% IV SOLN
1.0000 g | Freq: Once | INTRAVENOUS | Status: AC
  Administered 2022-06-11: 1000 mg via INTRAVENOUS
  Filled 2022-06-11: qty 50

## 2022-06-11 MED ORDER — ALTEPLASE 2 MG IJ SOLR: 2.0000 mg | Freq: Once | INTRAMUSCULAR | Status: DC | PRN

## 2022-06-11 MED ORDER — MIDAZOLAM HCL 2 MG/2ML IJ SOLN
1.0000 mg | Freq: Once | INTRAMUSCULAR | Status: DC
  Filled 2022-06-11 (×2): qty 2

## 2022-06-11 MED ORDER — FREE WATER
30.0000 mL | Status: DC
  Administered 2022-06-11 – 2022-06-16 (×20): 30 mL

## 2022-06-11 MED ORDER — LIDOCAINE HCL 1 % IJ SOLN
5.0000 mL | Freq: Once | INTRAMUSCULAR | Status: AC
  Administered 2022-06-11: 5 mL via INTRADERMAL
  Filled 2022-06-11: qty 10

## 2022-06-11 MED ORDER — PROSOURCE TF PO LIQD
45.0000 mL | Freq: Two times a day (BID) | ORAL | Status: DC
  Administered 2022-06-12 – 2022-06-14 (×5): 45 mL
  Filled 2022-06-11 (×9): qty 45

## 2022-06-11 MED ORDER — HEPARIN SODIUM (PORCINE) 1000 UNIT/ML DIALYSIS
1000.0000 [IU] | INTRAMUSCULAR | Status: DC | PRN
  Administered 2022-06-14 – 2022-06-16 (×2): 1000 [IU] via INTRAVENOUS_CENTRAL

## 2022-06-11 MED ORDER — LACTATED RINGERS IV SOLN: INTRAVENOUS | Status: DC

## 2022-06-11 MED ORDER — OSMOLITE 1.5 CAL PO LIQD
1000.0000 mL | ORAL | Status: DC
  Administered 2022-06-11: 1000 mL

## 2022-06-11 MED ORDER — LIDOCAINE HCL (PF) 1 % IJ SOLN: 5.0000 mL | INTRAMUSCULAR | Status: DC | PRN

## 2022-06-11 MED ORDER — METOCLOPRAMIDE HCL 5 MG/ML IJ SOLN
5.0000 mg | Freq: Three times a day (TID) | INTRAMUSCULAR | Status: DC | PRN
Start: 2022-06-11 — End: 2022-06-18

## 2022-06-11 MED ORDER — PENTAFLUOROPROP-TETRAFLUOROETH EX AERO: 1.0000 | INHALATION_SPRAY | CUTANEOUS | Status: DC | PRN

## 2022-06-11 MED ORDER — RENA-VITE PO TABS
1.0000 | ORAL_TABLET | Freq: Every day | ORAL | Status: DC
Start: 2022-06-11 — End: 2022-06-28
  Administered 2022-06-11 – 2022-06-27 (×13): 1
  Filled 2022-06-11 (×14): qty 1

## 2022-06-11 MED ORDER — IOHEXOL 180 MG/ML  SOLN
20.0000 mL | Freq: Once | INTRAMUSCULAR | Status: AC | PRN
Start: 2022-06-11 — End: 2022-06-11
  Administered 2022-06-11: 10 mL

## 2022-06-11 MED ORDER — MIDAZOLAM HCL 2 MG/2ML IJ SOLN
1.0000 mg | Freq: Once | INTRAMUSCULAR | Status: AC
  Administered 2022-06-11: 1 mg via INTRAVENOUS

## 2022-06-11 NOTE — Progress Notes (Signed)
PHARMACY NOTE:  ANTIMICROBIAL RENAL DOSAGE ADJUSTMENT  Current antimicrobial regimen includes a mismatch between antimicrobial dosage and estimated renal function.  As per policy approved by the Pharmacy & Therapeutics and Medical Executive Committees, the antimicrobial dosage will be adjusted accordingly.  Current antimicrobial dosage:  Zosyn 3.375 g IV q8h (4-hr infusion)  Indication: Severe, necrotizing pancreatitis  Renal Function:  Estimated Creatinine Clearance: 12.1 mL/min (A) (by C-G formula based on SCr of 6.32 mg/dL (H)).    Antimicrobial dosage has been changed to:  Zosyn 2.25 g IV q8h (30-min infusion)  Thank you for allowing pharmacy to be a part of this patient's care.  Benita Gutter, [facility] 06/11/2022 9:16 AM

## 2022-06-11 NOTE — Progress Notes (Signed)
Date of Admission:  06/08/2022    ID: Xavier Esparza is a 76 y.o. male Principal Problem:   Severe sepsis ([place]) Active Problems:   Diabetes mellitus without complication (H[facility])   BPH (benign prostatic hyperplasia)   Nephrolithiasis   Acute pancreatitis   Liver mass, right lobe, abscess vs mass   Portal vein thrombosis   AKI (acute kidney injury) ([place])   Syncope   History of prostate cancer   HTN (hypertension)   Necrotizing pancreatitis    Subjective: Pt still in apin Says abd hurts A little confused but answering more questions today  Medications:   Chlorhexidine Gluconate Cloth  6 each Topical Q0600   insulin aspart  0-15 Units Subcutaneous Q4H   lidocaine  5 mL Intradermal Once   midazolam  1 mg Intravenous Once    Objective: Vital signs in last 24 hours: Temp:  [97.7 F (36.5 C)-98.6 F (37 C)] 98 F (36.7 C) (06/16 0800) Pulse Rate:  [86-110] 101 (06/16 1200) Resp:  [16-32] 32 (06/16 1000) BP: (91-139)/(64-87) 116/78 (06/16 1200) SpO2:  [91 %-100 %] 94 % (06/16 1200) Weight:  [102 kg] 102 kg (06/15 1610) Patient Vitals for the past 24 hrs:  BP Temp Temp src Pulse Resp SpO2 Height Weight  06/11/22 1200 116/78 -- -- (!) 101 -- 94 % -- --  06/11/22 1100 114/68 -- -- 96 -- 93 % -- --  06/11/22 1000 139/87 -- -- 99 (!) 32 95 % -- --  06/11/22 0800 123/86 98 F (36.7 C) Oral 97 -- 94 % -- --  06/11/22 0700 127/79 -- -- 99 (!) 28 94 % -- --  06/11/22 0600 134/81 -- -- 100 (!) 27 92 % -- --  06/11/22 0500 111/74 -- -- 100 -- 95 % -- --  06/11/22 0400 111/76 98.1 F (36.7 C) Oral (!) 106 (!) 22 96 % -- --  06/11/22 0300 118/85 -- -- 90 (!) 24 93 % -- --  06/11/22 0200 132/81 -- -- 99 (!) 22 93 % -- --  06/11/22 0100 91/73 -- -- 86 16 95 % -- --  06/11/22 0000 110/78 -- -- 95 -- 93 % -- --  06/10/22 2300 117/73 -- -- (!) 103 -- 92 % -- --  06/10/22 2200 91/77 -- -- (!) 105 -- 96 % -- --  06/10/22 2100 94/64 -- -- 97 -- 93 % -- --  06/10/22 2000 107/64 -- -- 95  -- 95 % -- --  06/10/22 1900 109/69 -- -- (!) 104 -- 92 % -- --  06/10/22 1800 129/86 -- -- (!) 103 -- 93 % -- --  06/10/22 1700 108/78 -- -- (!) 105 -- 94 % -- --  06/10/22 1610 130/82 97.7 F (36.5 C) Oral (!) 110 18 91 % 6' (1.829 m) 102 kg  06/10/22 1400 112/80 98.6 F (37 C) Oral 98 18 100 % -- --     PHYSICAL EXAM:  General: awake but some confusion and restless afebrile Lungs: b/l air entry Heart: tachycardia Abdomen: Soft, distended epigastrium tender Extremities: atraumatic, no cyanosis. No edema. No clubbing Skin: No rashes or lesions. Or bruising Lymph: Cervical, supraclavicular normal. Neurologic: moves all extremities   Lab Results Recent Labs    06/10/22 1529 06/11/22 0419 06/11/22 1147  WBC 14.2* 13.3*  --   HGB 14.3 12.9*  --   HCT 44.1 38.8*  --   NA 137 137 137  K 4.8 4.3 4.2  CL 102 101 99  CO2 '23 26 25  '$ BUN 67* 78* 85*  CREATININE 5.29* 6.32* 6.99*   Liver Panel Recent Labs    06/08/22 1810 06/09/22 0415 06/10/22 0532 06/11/22 0419  PROT 6.8   < > 5.6* 5.4*  ALBUMIN 3.6   < > 2.8* 2.7*  AST 1,756*   < > 593* 262*  ALT 530*   < > 374* 218*  ALKPHOS 54   < > 44 41  BILITOT 1.9*   < > 1.7* 1.5*  BILIDIR 0.6*  --   --   --   IBILI 1.3*  --   --   --    < > = values in this interval not displayed.   Sedimentation Rate Recent Labs    06/10/22 1618  ESRSEDRATE 18   C-Reactive Protein Recent Labs    06/10/22 2155  CRP 27.0*    Microbiology: 6.13/23 bc - NG Studies/Results: CT ABDOMEN PELVIS WO CONTRAST  Result Date: 06/11/2022 CLINICAL DATA:  Sepsis, abdominal pain. History of prostate cancer. History of renal calculi. Syncope. Pancreatitis with focal pancreatic necrosis on 06/08/2022 MRI. 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 EXAM: CT ABDOMEN AND PELVIS WITHOUT CONTRAST TECHNIQUE: Multidetector CT imaging of the abdomen and pelvis was performed following the standard protocol without  IV contrast. RADIATION DOSE REDUCTION: This exam was performed according to the departmental dose-optimization program which includes automated exposure control, adjustment of the mA and/or kV according to patient size and/or use of iterative reconstruction technique. COMPARISON:  Multiple exams, including MRI 06/09/2022 and 06/08/2022 FINDINGS: Lower chest: Small but increased bilateral pleural effusions with passive atelectasis. Coronary artery atherosclerotic calcifications. Hepatobiliary: Heterogeneous density in the right hepatic lobe corresponding to the prior region of portal vein thrombosis and suspected micro abscesses. Scattered hepatic fluid density lesions compatible with cysts throughout the rest of the liver. High density material in the gallbladder, possibly sludge. Probable sludge in the nondilated common bile duct. Pancreas: Substantial peripancreatic edema. No gas density is identified within the pancreatic parenchyma or previous regions of pancreatic necrosis to indicate super infection with gas-forming organism. Acute peripancreatic fluid collections appear increased and there is increased surrounding mesenteric edema and fluid tracking along the paracolic gutters and anterior margins the perirenal fascia. Spleen: Unremarkable Adrenals/Urinary Tract: Two nonobstructive right renal calculi are observed, the larger measuring 0.7 cm in the right kidney lower pole on image 68 series 5. Benign 4.4 cm left kidney lower pole cyst. There is substantial contrast medium in the renal parenchyma bilaterally on today's noncontrast examination, indicating persistent nephrograms compatible with acute renal failure. This is likely left over from prior contrast enhanced studies from 06/08/2022 and possibly 06/09/2022. Correlate with urine production. Stomach/Bowel: Small type 1 hiatal hernia. Indistinct descending and transverse duodenum likely from secondary inflammation. Scattered colonic diverticula.  Descending and sigmoid colon diverticulosis. Vascular/Lymphatic: Atherosclerosis is present, including aortoiliac atherosclerotic disease. Reproductive: Brachytherapy seed implants in the prostate gland. Other: Increased ascites likely related to the pancreatitis. There is also increasing presacral edema. Musculoskeletal: Lumbar spondylosis and degenerative disc disease causing multilevel impingement. IMPRESSION: 1. Increased ascites and acute peripancreatic fluid collections. No gas is identified within the pancreatic parenchyma to suggest superinfection of the prior pancreatic necrosis with gas-forming organism at this time. Today's exam is performed without IV contrast and accordingly assessment for further pancreatic necrosis is not feasible. 2. Delayed nephrogram with contrast in the kidneys left over from the studies from 2-3 days ago. Appearance compatible with acute  renal failure. 3. Small but increased bilateral pleural effusions with passive atelectasis. 4. 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 5. Two nonobstructive right renal calculi are observed. 6. Other imaging findings of potential clinical significance: Aortic Atherosclerosis (ICD10-I70.0). Brachytherapy seed implants in the prostate gland. Multilevel lumbar impingement. Small type 1 hiatal hernia. Descending and sigmoid colon diverticulosis. Secondary inflammation of the descending and transverse duodenum. Electronically Signed   By: Van Clines M.D.   On: 06/11/2022 09:34   US RENAL  Result Date: 06/10/2022 CLINICAL DATA:  Acute kidney injury EXAM: RENAL / URINARY TRACT ULTRASOUND COMPLETE COMPARISON:  No prior ultrasound, correlation is made with CT abdomen pelvis and MRI abdomen 06/08/2022 FINDINGS: Right Kidney: Renal measurements: 11.6 x 5.6 x 5.2 cm = volume: 172 mL. Echogenicity within normal limits.  No mass or hydronephrosis visualized. Left Kidney: Renal measurements: 12.9 x 5.8 x 5.1 cm = volume: 201 mL. Echogenicity within normal limits. No hydronephrosis visualized. Cystic lesion in the mid left kidney measures up to 4.4 x 4.7 x 5.1 cm, consistent with a simple cyst, as seen on the 06/08/2022 CT and MRI Bladder: Appears normal for degree of bladder distention. Other: Small amount of ascites. IMPRESSION: 1. No acute finding in the kidneys. 2. Small volume ascites. Electronically Signed   By: Merilyn Baba M.D.   On: 06/10/2022 12:21   MR ABDOMEN MRCP WO CONTRAST  Result Date: 06/09/2022 CLINICAL DATA:  A 76 year old male presents for follow-up of imaging that was acquired earlier on the same date to assess the biliary tree in the setting of acute necrotic pancreatitis. Please see earlier MRI for further details regarding pancreatitis and associated findings. EXAM: MRI ABDOMEN WITHOUT CONTRAST  (INCLUDING MRCP) TECHNIQUE: Multiplanar multisequence MR imaging of the abdomen was performed. Heavily T2-weighted images of the biliary and pancreatic ducts were obtained, and three-dimensional MRCP images were rendered by post processing. COMPARISON:  MRI of the same date for which this study was done to complete the evaluation of the biliary tree. FINDINGS: Lower chest: Effusions partially visualized in the lower chest. Not well assessed. Hepatobiliary: Hepatic findings with hepatic lesions and portal venous thrombosis as well as hepatic cysts better outlined on previous MRI evaluation. There is sludge in the gallbladder. There is gallbladder wall edema as outlined on previous imaging. There is no discrete evidence of choledocholithiasis. The biliary tree is nondilated. The pancreatic duct is nondilated. Pancreas: Pancreatic edema as seen on recent imaging better displayed on recent MR evaluation performed earlier the same day. Spleen:  Not well evaluated better evaluated on previous imaging. Adrenals/Urinary  Tract: Similarly with limited assessment on this dedicated MRCP. Stomach/Bowel: Edema of the stomach and adjacent small bowel also better displayed on recent dedicated abdominal MRI. Vascular/Lymphatic:  Not well evaluated Other: Extensive edema throughout the upper abdomen with signs of perihepatic ascites. Musculoskeletal: Not well evaluated IMPRESSION: No signs of choledocholithiasis without dilation of the biliary tree. See dedicated abdominal MRI for further details regarding liver and pancreas as well as associated findings of acute necrotic pancreatitis. Electronically Signed   By: Zetta Bills M.D.   On: 06/09/2022 15:54   MR 3D Recon At Scanner  Result Date: 06/09/2022 CLINICAL DATA:  A 76 year old male presents for follow-up of imaging that was acquired earlier on the same date to assess the biliary tree in the setting of acute necrotic pancreatitis. Please see earlier MRI for further details  regarding pancreatitis and associated findings. EXAM: MRI ABDOMEN WITHOUT CONTRAST  (INCLUDING MRCP) TECHNIQUE: Multiplanar multisequence MR imaging of the abdomen was performed. Heavily T2-weighted images of the biliary and pancreatic ducts were obtained, and three-dimensional MRCP images were rendered by post processing. COMPARISON:  MRI of the same date for which this study was done to complete the evaluation of the biliary tree. FINDINGS: Lower chest: Effusions partially visualized in the lower chest. Not well assessed. Hepatobiliary: Hepatic findings with hepatic lesions and portal venous thrombosis as well as hepatic cysts better outlined on previous MRI evaluation. There is sludge in the gallbladder. There is gallbladder wall edema as outlined on previous imaging. There is no discrete evidence of choledocholithiasis. The biliary tree is nondilated. The pancreatic duct is nondilated. Pancreas: Pancreatic edema as seen on recent imaging better displayed on recent MR evaluation performed earlier the same  day. Spleen:  Not well evaluated better evaluated on previous imaging. Adrenals/Urinary Tract: Similarly with limited assessment on this dedicated MRCP. Stomach/Bowel: Edema of the stomach and adjacent small bowel also better displayed on recent dedicated abdominal MRI. Vascular/Lymphatic:  Not well evaluated Other: Extensive edema throughout the upper abdomen with signs of perihepatic ascites. Musculoskeletal: Not well evaluated IMPRESSION: No signs of choledocholithiasis without dilation of the biliary tree. See dedicated abdominal MRI for further details regarding liver and pancreas as well as associated findings of acute necrotic pancreatitis. Electronically Signed   By: Zetta Bills M.D.   On: 06/09/2022 15:54   NM Pulmonary Perfusion  Result Date: 06/09/2022 CLINICAL DATA:  Chest pain EXAM: NUCLEAR MEDICINE PERFUSION LUNG SCAN TECHNIQUE: Perfusion images were obtained in multiple projections after intravenous injection of radiopharmaceutical. Ventilation scans intentionally deferred if perfusion scan and chest x-ray adequate for interpretation during COVID 19 epidemic. RADIOPHARMACEUTICALS:  3.96 mCi Tc-41mMAA IV COMPARISON:  Chest x-ray dated June 08, 2022 FINDINGS: No large segmental perfusion defects. Small subsegmental perfusion defects seen in the right lung. IMPRESSION: Nondiagnostic (low or intermediate probability) Electronically Signed   By: LYetta GlassmanM.D.   On: 06/09/2022 14:25     Assessment/Plan: Impression/Recommendation ?89yr male who was having hematuria for a few weeks and was followed by urologist and was supposed to have systoscopy in preparation of which he was given levauin- He took 2 doses of levauin within 12 hrs and had syncopal episode and dizziness after that Later developed abdominal pain in the ED  found to have acute  necrotizing pancreas Is this a allergic reaction/idisyncratic reaction to Levaquin? Or is it just coincidental ??   Necrotizing pancreatitis -  unclear etiology  ? Levaquin ? Gall stones ( none seen in [place] No alcohol No other new drugs   This pancreatitis is quick in progression and he has portal vein thrombosis and liver leison around the PV suggestive of liver inflammation, phlegmon but not a well defined abscess  Agree with zosyn Ne fever in 24 hrs   AKI- combination of acute pancreatitis, ATN ???contrast induced nephropathy ?? rhabdo   Encephalopathy- multifactorial- HE was alert on admission--AKI and opioids are contributing to it Toxic encephalopathy from pancreatitis   Was hypotensive before presentation but not now On no pressors   Abnormal LFTS- improving a lot combination of  shock liver ? Ascending cholangitis ? pancreatitis      Hypocalcemia from acute pancreatitis   EKG- RBBB-prolonged PR   Hematuria on presentation H/o prostate ca   Condition critical   Discussed the management with care team  RCID  available  by phon this weekend

## 2022-06-11 NOTE — Progress Notes (Signed)
PROGRESS NOTE    Xavier EGGEBRECHT  GNF:[id number] DOB: [birth year] DOA: 06/08/2022 PCP: Tonia Ghent, MD    Brief Narrative:  76 y.o. male with medical history significant for DM, BPH, history of prostate cancer, kidney stones status post lithotripsy, negative colonoscopy 2022 (3 benign polyps), who presents to the ED by EMS following a syncopal episode at home while walking to the bathroom.  Wife said she heard a loud thud and went to find him on the floor.  She said he was awake when she got there but he could not help himself up and she called 911.  Patient states he was in his usual state of health but about 2 weeks prior he saw a little blood in his urine similar to when he had his kidney stone.  He saw his urologist yesterday who gave him a prescription for Levaquin.  He otherwise denied feeling unwell previous to the episode.  He denied chest pain, shortness of breath, headache or visual disturbance, one-sided numbness weakness or tingling, nausea or vomiting, abdominal pain, urinary frequency or burning or diarrhea.[facility] reported BP 76/45 and he was treated with IM epinephrine and a 400 mL fluid bolus.  He was awake and alert x4 by arrival.  After his arrival in the emergency room he developed abdominal pain and vomiting.   Initial CT abdomen pelvis with moderate acute pancreatitis no evidence of pancreatic necrosis or pseudocyst.  Follow-up MRI abdomen concerning for possible choledocholithiasis with evidence of necrotizing pancreatitis.  Case discussed with GI Dr. Marius Ditch at [facility].   There was mention of possible portal vein thrombosis.  Heparin GTT was initially started however patient developed suspected acute hematuria so heparin GTT was stopped.  VQ scan ordered and perfusion component will be completed prior to patient transfer.   6/15: Clinical status overall unchanged.  Patient remains with abdominal pain associated with nausea.  Hemodynamically stable.  Mild elevation in white count.   Lipase and LFTs have improved over interval.  Discussed case with GI and general surgery.  Likely that patient's presentation is secondary to necrotizing pancreatitis.  Currently as there is no CBD stone and there is no strong indication for transfer for ERCP.  From a surgical standpoint no intervention is recommended.  There is nothing from an interventional radiology standpoint to drain.  As such we will discontinue transfer and keep patient in [facility].  6/16: No appreciable change in clinical status.  LFTs are downtrending.  White count slightly downtrending.  No fevers noted over interval.  Increasing abdominal distention noted.  Ascites and peripancreatic fluid seen on repeat CAT scan today.  Discussed case with PCCM, nephrology, GI.  Will attempt postpyloric feedings.  Kidney function continues to deteriorate.  Nephrology will initiate hemodialysis today.   Assessment & Plan:   Principal Problem:   Severe sepsis ([facility]) Active Problems:   Acute pancreatitis   Liver mass, right lobe, abscess vs mass   Portal vein thrombosis   Syncope   AKI (acute kidney injury) ([place])   HTN (hypertension)   Diabetes mellitus without complication (HCC)   BPH (benign prostatic hyperplasia)   Nephrolithiasis   History of prostate cancer   Necrotizing pancreatitis  Severe necrotizing pancreatitis Portal vein thrombosis Patient presented with intractable nausea and vomiting.  Ill-defined heterogeneous mass in the liver.  MRI and MRCP revealed severe acute necrotizing pancreatitis with associated portal venous thrombosis.  No evidence of CBD stone.  Microabscess noted.  Plan: Complex management.  Will need involvement from multiple  consult services.  Consultants engaged include general surgery, IR, nephrology, ICU.  GI following and appreciate involvement in care.  Continue aggressive intravenous fluid resuscitation.  Continue stepdown status.  Appreciate ICU management.  We will attempt postpyloric Dobbhoff  tube today.  Continue IV pain control, IV antiemetics, IV Zosyn  We will attempt to continue heparin GTT.  Unfortunately patient developed acute hematuria today 6/16.  We will temporarily hold heparin GTT and reattempt to start tomorrow.    At this point transfer to tertiary care is not a likely option.  I have attempted to contact [facility] and [facility] transfer centers.  Unfortunately both of these services are close to outside medicine transfers.  I have discussed this with the general surgery service who continues to feel that patient will be better served in a tertiary care facility.  If a surgical service at [facility] or tertiary care facility would be willing to accept the patient on their service for further management and close monitoring we can transfer otherwise we will continue to provide optimized care at our facility.   Acute kidney injury Significant and in the setting of aggressive fluid resuscitation.  AKI likely secondary to ATN in the setting of severe acute pancreatitis Kidney function continues to deteriorate.  Creatinine 6 as of 6/16 Plan: Discussed with nephrology.  We will place Vas-Cath and initiate hemodialysis today  Syncopal event Suspect secondary to hypotension versus vasovagal event PE ruled out, VQ scan low probability Continue telemetry monitoring  Hematuria History of nephrolithiasis History history of BPH History of prostate cancer No evidence of urinary retention despite worsening kidney function Continue Foley catheter  Diabetes mellitus without complication Sliding-scale coverage    DVT prophylaxis: Heparin GTT Code Status: Full Family Communication: Wife at bedside 6/15 Disposition Plan: Status is: Inpatient Remains inpatient appropriate because: Severe acute pancreatitis   Level of care: Stepdown  Consultants:  ICU GI General surgery ID Nephrology  Procedures:  None  Antimicrobials: Zosyn   Subjective: Patient seen and examined.   Lying in bed.  Appears uncomfortable  Objective: Vitals:   06/11/22 1000 06/11/22 1100 06/11/22 1200 06/11/22 1300  BP: 139/87 114/68 116/78 123/89  Pulse: 99 96 (!) 101 100  Resp: (!) 32  (!) 22   Temp:   98.3 F (36.8 C)   TempSrc:   Oral   SpO2: 95% 93% 94% 91%  Weight:      Height:        Intake/Output Summary (Last 24 hours) at 06/11/2022 1402 Last data filed at 06/11/2022 1300 Gross per 24 hour  Intake 2335.54 ml  Output 225 ml  Net 2110.54 ml   Filed Weights   06/08/22 1046 06/10/22 1610  Weight: 96.6 kg 102 kg    Examination:  General exam: Uncomfortable appearing Respiratory system: Clear to auscultation. Respiratory effort normal. Cardiovascular system: S1-S2, RRR, no murmurs, no pedal edema Gastrointestinal system: Mild distention.  Right lower quadrant tenderness Central nervous system: Alert and oriented. No focal neurological deficits. Extremities: Symmetric 5 x 5 power. Skin: No rashes, lesions or ulcers Psychiatry: Judgement and insight appear normal. Mood & affect appropriate.     Data Reviewed: I have personally reviewed following labs and imaging studies  CBC: Recent Labs  Lab 06/08/22 1057 06/09/22 0415 06/10/22 0532 06/10/22 1529 06/11/22 0419  WBC 19.8* 11.4* 12.6* 14.2* 13.3*  NEUTROABS 15.6* 9.7*  --   --  11.2*  HGB 15.9 14.3 15.4 14.3 12.9*  HCT 50.2 43.9 48.0 44.1 38.8*  MCV  85.5 84.1 83.8 84.0 82.4  PLT 222 157 154 167 885   Basic Metabolic Panel: Recent Labs  Lab 06/09/22 0415 06/10/22 0532 06/10/22 1529 06/10/22 1700 06/11/22 0419 06/11/22 1147  NA 136 137 137  --  137 137  K 4.8 5.1 4.8  --  4.3 4.2  CL 100 106 102  --  101 99  CO2 26 21* 23  --  26 25  GLUCOSE 140* 126* 131*  --  138* 131*  BUN 38* 50* 67*  --  78* 85*  CREATININE 1.96* 3.95* 5.29*  --  6.32* 6.99*  CALCIUM 8.2* 5.7* 5.6*  --  4.3* <4.0*  PHOS  --   --   --  3.4  --   --    GFR: Estimated Creatinine Clearance: 10.9 mL/min (A) (by C-G formula  based on SCr of 6.99 mg/dL (H)). Liver Function Tests: Recent Labs  Lab 06/08/22 1810 06/09/22 0415 06/10/22 0532 06/11/22 0419  AST 1,756* 1,895* 593* 262*  ALT 530* 621* 374* 218*  ALKPHOS 54 57 44 41  BILITOT 1.9* 1.4* 1.7* 1.5*  PROT 6.8 6.1* 5.6* 5.4*  ALBUMIN 3.6 3.4* 2.8* 2.7*   Recent Labs  Lab 06/08/22 1810 06/09/22 0415 06/10/22 0532  LIPASE 1,633* 1,741* 688*   Recent Labs  Lab 06/10/22 1900  AMMONIA 13   Coagulation Profile: Recent Labs  Lab 06/08/22 2028  INR 1.1   Cardiac Enzymes: Recent Labs  Lab 06/10/22 1618 06/11/22 0419  CKTOTAL 1,611* 1,740*   BNP (last 3 results) No results for input(s): "PROBNP" in the last 8760 hours. HbA1C: No results for input(s): "HGBA1C" in the last 72 hours. CBG: Recent Labs  Lab 06/10/22 1932 06/10/22 2320 06/11/22 0329 06/11/22 0731 06/11/22 1110  GLUCAP 136* 140* 136* 146* 140*   Lipid Profile: Recent Labs    06/10/22 1529  TRIG 135   Thyroid Function Tests: No results for input(s): "TSH", "T4TOTAL", "FREET4", "T3FREE", "THYROIDAB" in the last 72 hours. Anemia Panel: No results for input(s): "VITAMINB12", "FOLATE", "FERRITIN", "TIBC", "IRON", "RETICCTPCT" in the last 72 hours. Sepsis Labs: Recent Labs  Lab 06/09/22 0415 06/10/22 0532 06/10/22 0938 06/10/22 1529 06/10/22 1813  PROCALCITON 6.81 19.36  --   --   --   LATICACIDVEN  --  2.9* 3.5* 4.0* 2.4*    Recent Results (from the past 240 hour(s))  Blood culture (routine x 2)     Status: None (Preliminary result)   Collection Time: 06/08/22  1:33 PM   Specimen: BLOOD  Result Value Ref Range Status   Specimen Description BLOOD LEFT HAND  Final   Special Requests   Final    BOTTLES DRAWN AEROBIC AND ANAEROBIC Blood Culture results may not be optimal due to an inadequate volume of blood received in culture bottles   Culture   Final    NO GROWTH 3 DAYS Performed at [facility], [address] [place], [place] [address]     Report Status PENDING  Incomplete  Blood culture (routine x 2)     Status: None (Preliminary result)   Collection Time: 06/08/22  2:15 PM   Specimen: BLOOD  Result Value Ref Range Status   Specimen Description BLOOD BLOOD LEFT WRIST  Final   Special Requests   Final    BOTTLES DRAWN AEROBIC AND ANAEROBIC Blood Culture results may not be optimal due to an inadequate volume of blood received in culture bottles   Culture   Final    NO GROWTH  3 DAYS Performed at [facility], [address] [place], [place] [address]    Report Status PENDING  Incomplete  MRSA Next Gen by PCR, Nasal     Status: None   Collection Time: 06/10/22  4:14 PM   Specimen: Nasal Mucosa; Nasal Swab  Result Value Ref Range Status   MRSA by PCR Next Gen NOT DETECTED NOT DETECTED Final    Comment: (NOTE) The GeneXpert MRSA Assay (FDA approved for NASAL specimens only), is one component of a comprehensive MRSA colonization surveillance program. It is not intended to diagnose MRSA infection nor to guide or monitor treatment for MRSA infections. Test performance is not FDA approved in patients less than 43 years old. Performed at [facility], [address] [place], [place] [address]   SARS Coronavirus 2 by RT PCR (hospital order, performed in [facility] hospital lab) *cepheid single result test* Anterior Nasal Swab     Status: None   Collection Time: 06/10/22  5:17 PM   Specimen: Anterior Nasal Swab  Result Value Ref Range Status   SARS Coronavirus 2 by RT PCR NEGATIVE NEGATIVE Final    Comment: (NOTE) SARS-CoV-2 target nucleic acids are NOT DETECTED.  The SARS-CoV-2 RNA is generally detectable in upper and lower respiratory specimens during the acute phase of infection. The lowest concentration of SARS-CoV-2 viral copies this assay can detect is 250 copies / mL. A negative result does not preclude SARS-CoV-2 infection and should not be used as the sole basis for treatment or other patient  management decisions.  A negative result may occur with improper specimen collection / handling, submission of specimen other than nasopharyngeal swab, presence of viral mutation(s) within the areas targeted by this assay, and inadequate number of viral copies (<250 copies / mL). A negative result must be combined with clinical observations, patient history, and epidemiological information.  Fact Sheet for Patients:   https://www.patel.info/  Fact Sheet for Healthcare Providers: https://hall.com/  This test is not yet approved or  cleared by the [place] FDA and has been authorized for detection and/or diagnosis of SARS-CoV-2 by FDA under an Emergency Use Authorization (EUA).  This EUA will remain in effect (meaning this test can be used) for the duration of the COVID-19 declaration under Section 564(b)(1) of the Act, 21 U.S.C. section 360bbb-3(b)(1), unless the authorization is terminated or revoked sooner.  Performed at [facility], [address], [place], [place] [address]          Radiology Studies: CT ABDOMEN PELVIS WO CONTRAST  Result Date: 06/11/2022 CLINICAL DATA:  Sepsis, abdominal pain. History of prostate cancer. History of renal calculi. Syncope. Pancreatitis with focal pancreatic necrosis on 06/08/2022 MRI. 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 EXAM: CT ABDOMEN AND PELVIS WITHOUT CONTRAST TECHNIQUE: Multidetector CT imaging of the abdomen and pelvis was performed following the standard protocol without IV contrast. RADIATION DOSE REDUCTION: This exam was performed according to the departmental dose-optimization program which includes automated exposure control, adjustment of the mA and/or kV according to patient size and/or use of iterative reconstruction technique. COMPARISON:  Multiple exams, including MRI 06/09/2022 and 06/08/2022 FINDINGS: Lower chest: Small  but increased bilateral pleural effusions with passive atelectasis. Coronary artery atherosclerotic calcifications. Hepatobiliary: Heterogeneous density in the right hepatic lobe corresponding to the prior region of portal vein thrombosis and suspected micro abscesses. Scattered hepatic fluid density lesions compatible with cysts throughout the rest of the liver. High density material  in the gallbladder, possibly sludge. Probable sludge in the nondilated common bile duct. Pancreas: Substantial peripancreatic edema. No gas density is identified within the pancreatic parenchyma or previous regions of pancreatic necrosis to indicate super infection with gas-forming organism. Acute peripancreatic fluid collections appear increased and there is increased surrounding mesenteric edema and fluid tracking along the paracolic gutters and anterior margins the perirenal fascia. Spleen: Unremarkable Adrenals/Urinary Tract: Two nonobstructive right renal calculi are observed, the larger measuring 0.7 cm in the right kidney lower pole on image 68 series 5. Benign 4.4 cm left kidney lower pole cyst. There is substantial contrast medium in the renal parenchyma bilaterally on today's noncontrast examination, indicating persistent nephrograms compatible with acute renal failure. This is likely left over from prior contrast enhanced studies from 06/08/2022 and possibly 06/09/2022. Correlate with urine production. Stomach/Bowel: Small type 1 hiatal hernia. Indistinct descending and transverse duodenum likely from secondary inflammation. Scattered colonic diverticula. Descending and sigmoid colon diverticulosis. Vascular/Lymphatic: Atherosclerosis is present, including aortoiliac atherosclerotic disease. Reproductive: Brachytherapy seed implants in the prostate gland. Other: Increased ascites likely related to the pancreatitis. There is also increasing presacral edema. Musculoskeletal: Lumbar spondylosis and degenerative disc disease  causing multilevel impingement. IMPRESSION: 1. Increased ascites and acute peripancreatic fluid collections. No gas is identified within the pancreatic parenchyma to suggest superinfection of the prior pancreatic necrosis with gas-forming organism at this time. Today's exam is performed without IV contrast and accordingly assessment for further pancreatic necrosis is not feasible. 2. Delayed nephrogram with contrast in the kidneys left over from the studies from 2-3 days ago. Appearance compatible with acute renal failure. 3. Small but increased bilateral pleural effusions with passive atelectasis. 4. 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 5. Two nonobstructive right renal calculi are observed. 6. Other imaging findings of potential clinical significance: Aortic Atherosclerosis (ICD10-I70.0). Brachytherapy seed implants in the prostate gland. Multilevel lumbar impingement. Small type 1 hiatal hernia. Descending and sigmoid colon diverticulosis. Secondary inflammation of the descending and transverse duodenum. Electronically Signed   By: Van Clines M.D.   On: 06/11/2022 09:34   US RENAL  Result Date: 06/10/2022 CLINICAL DATA:  Acute kidney injury EXAM: RENAL / URINARY TRACT ULTRASOUND COMPLETE COMPARISON:  No prior ultrasound, correlation is made with CT abdomen pelvis and MRI abdomen 06/08/2022 FINDINGS: Right Kidney: Renal measurements: 11.6 x 5.6 x 5.2 cm = volume: 172 mL. Echogenicity within normal limits. No mass or hydronephrosis visualized. Left Kidney: Renal measurements: 12.9 x 5.8 x 5.1 cm = volume: 201 mL. Echogenicity within normal limits. No hydronephrosis visualized. Cystic lesion in the mid left kidney measures up to 4.4 x 4.7 x 5.1 cm, consistent with a simple cyst, as seen on the 06/08/2022 CT and MRI Bladder: Appears normal for degree of bladder distention.  Other: Small amount of ascites. IMPRESSION: 1. No acute finding in the kidneys. 2. Small volume ascites. Electronically Signed   By: Merilyn Baba M.D.   On: 06/10/2022 12:21   MR ABDOMEN MRCP WO CONTRAST  Result Date: 06/09/2022 CLINICAL DATA:  A 76 year old male presents for follow-up of imaging that was acquired earlier on the same date to assess the biliary tree in the setting of acute necrotic pancreatitis. Please see earlier MRI for further details regarding pancreatitis and associated findings. EXAM: MRI ABDOMEN WITHOUT CONTRAST  (INCLUDING MRCP) TECHNIQUE: Multiplanar multisequence MR imaging of the abdomen was performed. Heavily T2-weighted images of the biliary  and pancreatic ducts were obtained, and three-dimensional MRCP images were rendered by post processing. COMPARISON:  MRI of the same date for which this study was done to complete the evaluation of the biliary tree. FINDINGS: Lower chest: Effusions partially visualized in the lower chest. Not well assessed. Hepatobiliary: Hepatic findings with hepatic lesions and portal venous thrombosis as well as hepatic cysts better outlined on previous MRI evaluation. There is sludge in the gallbladder. There is gallbladder wall edema as outlined on previous imaging. There is no discrete evidence of choledocholithiasis. The biliary tree is nondilated. The pancreatic duct is nondilated. Pancreas: Pancreatic edema as seen on recent imaging better displayed on recent MR evaluation performed earlier the same day. Spleen:  Not well evaluated better evaluated on previous imaging. Adrenals/Urinary Tract: Similarly with limited assessment on this dedicated MRCP. Stomach/Bowel: Edema of the stomach and adjacent small bowel also better displayed on recent dedicated abdominal MRI. Vascular/Lymphatic:  Not well evaluated Other: Extensive edema throughout the upper abdomen with signs of perihepatic ascites. Musculoskeletal: Not well evaluated IMPRESSION: No signs of  choledocholithiasis without dilation of the biliary tree. See dedicated abdominal MRI for further details regarding liver and pancreas as well as associated findings of acute necrotic pancreatitis. Electronically Signed   By: Zetta Bills M.D.   On: 06/09/2022 15:54   MR 3D Recon At Scanner  Result Date: 06/09/2022 CLINICAL DATA:  A 76 year old male presents for follow-up of imaging that was acquired earlier on the same date to assess the biliary tree in the setting of acute necrotic pancreatitis. Please see earlier MRI for further details regarding pancreatitis and associated findings. EXAM: MRI ABDOMEN WITHOUT CONTRAST  (INCLUDING MRCP) TECHNIQUE: Multiplanar multisequence MR imaging of the abdomen was performed. Heavily T2-weighted images of the biliary and pancreatic ducts were obtained, and three-dimensional MRCP images were rendered by post processing. COMPARISON:  MRI of the same date for which this study was done to complete the evaluation of the biliary tree. FINDINGS: Lower chest: Effusions partially visualized in the lower chest. Not well assessed. Hepatobiliary: Hepatic findings with hepatic lesions and portal venous thrombosis as well as hepatic cysts better outlined on previous MRI evaluation. There is sludge in the gallbladder. There is gallbladder wall edema as outlined on previous imaging. There is no discrete evidence of choledocholithiasis. The biliary tree is nondilated. The pancreatic duct is nondilated. Pancreas: Pancreatic edema as seen on recent imaging better displayed on recent MR evaluation performed earlier the same day. Spleen:  Not well evaluated better evaluated on previous imaging. Adrenals/Urinary Tract: Similarly with limited assessment on this dedicated MRCP. Stomach/Bowel: Edema of the stomach and adjacent small bowel also better displayed on recent dedicated abdominal MRI. Vascular/Lymphatic:  Not well evaluated Other: Extensive edema throughout the upper abdomen with signs  of perihepatic ascites. Musculoskeletal: Not well evaluated IMPRESSION: No signs of choledocholithiasis without dilation of the biliary tree. See dedicated abdominal MRI for further details regarding liver and pancreas as well as associated findings of acute necrotic pancreatitis. Electronically Signed   By: Zetta Bills M.D.   On: 06/09/2022 15:54   NM Pulmonary Perfusion  Result Date: 06/09/2022 CLINICAL DATA:  Chest pain EXAM: NUCLEAR MEDICINE PERFUSION LUNG SCAN TECHNIQUE: Perfusion images were obtained in multiple projections after intravenous injection of radiopharmaceutical. Ventilation scans intentionally deferred if perfusion scan and chest x-ray adequate for interpretation during COVID 19 epidemic. RADIOPHARMACEUTICALS:  3.96 mCi Tc-7mMAA IV COMPARISON:  Chest x-ray dated June 08, 2022 FINDINGS: No large segmental perfusion defects. Small  subsegmental perfusion defects seen in the right lung. IMPRESSION: Nondiagnostic (low or intermediate probability) Electronically Signed   By: Yetta Glassman M.D.   On: 06/09/2022 14:25        Scheduled Meds:  Chlorhexidine Gluconate Cloth  6 each Topical Q0600   insulin aspart  0-15 Units Subcutaneous Q4H   midazolam  1 mg Intravenous Once   Continuous Infusions:  lactated ringers 100 mL/hr at 06/11/22 1300   piperacillin-tazobactam (ZOSYN)  IV 2.25 g (06/11/22 1325)     LOS: 3 days   CRITICAL CARE Performed by: Sidney Ace   Total critical care time: 45 minutes  Critical care time was exclusive of separately billable procedures and treating other patients.  Critical care was necessary to treat or prevent imminent or life-threatening deterioration.  Critical care was time spent personally by me on the following activities: development of treatment plan with patient and/or surrogate as well as nursing, discussions with consultants, evaluation of patient's response to treatment, examination of patient, obtaining history from  patient or surrogate, ordering and performing treatments and interventions, ordering and review of laboratory studies, ordering and review of radiographic studies, pulse oximetry and re-evaluation of patient's condition.     Sidney Ace, MD [facility]   If 7PM-7AM, please contact night-coverage  06/11/2022, 2:02 PM

## 2022-06-11 NOTE — Progress Notes (Signed)
Initial Nutrition Assessment  DOCUMENTATION CODES:   Obesity unspecified  INTERVENTION:   Osmolite 1.5'@65ml' /hr- Initiate at 78m/hr and increase by 184mhr q 8 hours until goal rate is reached.   Pro-Source 4565mID via tube, provides 40kcal and 11g of protein per serving   Free water flushes 46m43m hours to maintain tube patency   Regimen provides 2420kcal/day, 120g/day protein and 1369ml76m of free water   Pt at high refeed risk; recommend monitor potassium, magnesium and phosphorus labs daily until stable  Rena-vit daily via tube   NUTRITION DIAGNOSIS:   Inadequate oral intake related to acute illness as evidenced by NPO status.  GOAL:   Patient will meet greater than or equal to 90% of their needs  MONITOR:   Diet advancement, Labs, Weight trends, TF tolerance, Skin, I & O's  REASON FOR ASSESSMENT:   Consult Enteral/tube feeding initiation and management  ASSESSMENT:   76 y/84male with h/o DM, BPH, prostate cancer, kidney stones and HTN who is admitted with severe necrotizing pancreatitis, portal vein thrombosis, liver lesions, AKI and sepsis.  -Pt s/p fluoroscopy guided post pyloric NGT today  Met with pt in room today. Pt reports poor oral intake for several days pta r/t abdominal pain and poor appetite. Pt reports that he is feeling bad today. He reports continued abdominal pain. Pt with abdominal distension today. Pt is noted to have ascites. HD initiated today for AKI. Plan today will be to start trickle tube feeds via NGT and advance to goal rate over 24-48 hours. Pt is at high refeed risk. Per chart, pt with weight gain pta.   Medications reviewed and include: insulin, LRS '@100ml' /hr, zosyn   Labs reviewed: K 4.2 wnl, BUN 85(H), creat 6.99(H), Ca <4.0(L), alb 2.7(L), lipase 688(H) Wbc- 13.3(H) Cbgs- 157, 140, 146, 136 x 24 hrs AIC 6.2- 1/13  NUTRITION - FOCUSED PHYSICAL EXAM:  Flowsheet Row Most Recent Value  Orbital Region No depletion  Upper Arm  Region No depletion  Thoracic and Lumbar Region No depletion  Buccal Region No depletion  Temple Region No depletion  Clavicle Bone Region No depletion  Clavicle and Acromion Bone Region No depletion  Scapular Bone Region No depletion  Dorsal Hand No depletion  Patellar Region No depletion  Anterior Thigh Region No depletion  Posterior Calf Region No depletion  Edema (RD Assessment) None  Hair Reviewed  Eyes Reviewed  Mouth Reviewed  Skin Reviewed  Nails Reviewed   Diet Order:   Diet Order             Diet - low sodium heart healthy           Diet NPO time specified  Diet effective now                  EDUCATION NEEDS:   Education needs have been addressed  Skin:  Skin Assessment: Reviewed RN Assessment  Last BM:  pta  Height:   Ht Readings from Last 1 Encounters:  06/10/22 6' (1.829 m)    Weight:   Wt Readings from Last 1 Encounters:  06/11/22 102.7 kg    Ideal Body Weight:  80.9 kg  BMI:  Body mass index is 30.71 kg/m.  Estimated Nutritional Needs:   Kcal:  2200-2500kcal/day  Protein:  110-125g/day  Fluid:  2.1-2.4L/day  CaseyKoleen DistanceRD, LDN Please refer to [facility] and/or RD on-call/weekend/after hours pager

## 2022-06-11 NOTE — Progress Notes (Signed)
[facility] SURGICAL PROGRESS NOTE (cpt [phone])  Hospital Day(s): 3.   Interval History:  Patient seen and examined no acute events or new complaints overnight.  Patient continues to have abdominal pain  Leukocytosis with some improvement this morning; to 13.3K Renal function continues to worsen; sCr - 6.32; UO - 250 ccs Hypocalcemia to 4.3 (worse) LFTs and Bilirubin are improving He is NPO He continues on Zosyn   Review of Systems:  Constitutional: denies fever, chills  HEENT: denies cough or congestion  Respiratory: denies any shortness of breath  Cardiovascular: denies chest pain or palpitations  Gastrointestinal: + abdominal pain Genitourinary: denies burning with urination or urinary frequency Musculoskeletal: denies pain, decreased motor or sensation  Vital signs in last 24 hours: [min-max] current  Temp:  [97.7 F (36.5 C)-98.8 F (37.1 C)] 98.1 F (36.7 C) (06/16 0400) Pulse Rate:  [86-110] 99 (06/16 0700) Resp:  [16-28] 28 (06/16 0700) BP: (91-134)/(64-86) 127/79 (06/16 0700) SpO2:  [91 %-100 %] 94 % (06/16 0700) Weight:  [102 kg] 102 kg (06/15 1610)     Height: 6' (182.9 cm) Weight: 102 kg BMI (Calculated): 30.49   Intake/Output last 2 shifts:  06/15 0701 - 06/16 0700 In: 1613.2 [I.V.:1402.1; IV Piggyback:211.2] Out: 250 [Urine:250]   Physical Exam:  Constitutional: alert, cooperative and no distress  HENT: normocephalic without obvious abnormality  Eyes: PERRL, EOM's grossly intact and symmetric  Respiratory: breathing non-labored at rest  Cardiovascular: regular rate and sinus rhythm  Gastrointestinal: Soft, upper abdominal tenderness, some distended, no rebound/guarding   Labs:     Latest Ref Rng & Units 06/11/2022    4:19 AM 06/10/2022    3:29 PM 06/10/2022    5:32 AM  CBC  WBC 4.0 - 10.5 K/uL 13.3  14.2  12.6   Hemoglobin 13.0 - 17.0 g/dL 12.9  14.3  15.4   Hematocrit 39.0 - 52.0 % 38.8  44.1  48.0   Platelets 150 - 400 K/uL 157   167  154       Latest Ref Rng & Units 06/11/2022    4:19 AM 06/10/2022    3:29 PM 06/10/2022    5:32 AM  CMP  Glucose 70 - 99 mg/dL 138  131  126   BUN 8 - 23 mg/dL 78  67  50   Creatinine 0.61 - 1.24 mg/dL 6.32  5.29  3.95   Sodium 135 - 145 mmol/L 137  137  137   Potassium 3.5 - 5.1 mmol/L 4.3  4.8  5.1   Chloride 98 - 111 mmol/L 101  102  106   CO2 22 - 32 mmol/L '26  23  21   '$ Calcium 8.9 - 10.3 mg/dL 4.3  5.6  5.7   Total Protein 6.5 - 8.1 g/dL 5.4   5.6   Total Bilirubin 0.3 - 1.2 mg/dL 1.5   1.7   Alkaline Phos 38 - 126 U/L 41   44   AST 15 - 41 U/L 262   593   ALT 0 - 44 U/L 218   374      Imaging studies: No new pertinent imaging studies   Assessment/Plan: (ICD-10's: K85.90) 76 y.o. male with necrotizing pancreatitis (unclear source), worsening AKI, hypocalcemia, and numerous comorbid conditions   - At this time, no surgical emergent intervention warranted. Again, this critically ill patient would be best benefited in tertiary care setting. Agree that there may not be much else to offer at a tertiary facility currently but  the worry is he is at high risk for decompensation. Additionally, should he need surgical intervention, this would best be done at a tertiary center, and would hate for transfer to delay his care. Understand this has been attempt but tertiary centers are closed to transfer. No emergent indications at this time.    - NPO + IVF support  - Continue IV Abx (Zosyn)  - Monitor abdominal examination - May need repeat imaging pending clinical condition    - Monitor leukocytosis; improving  - Monitor renal function; worsening; nephrology following  - Continue foley catheter; monitor UO  - Pain control prn; antiemetics prn  All of the above findings and recommendations were discussed with the patient, and the medical team, and all of patient's questions were answered to his expressed satisfaction.  -- Edison Simon, PA-C [facility] 06/11/2022, 7:46 AM M-F: 7am - 4pm

## 2022-06-11 NOTE — Progress Notes (Signed)
[place] for heparin infusion initiation & monitoring Indication: portal vein thrombosis  Patient Measurements: Height: 6' (182.9 cm) Weight: 102 kg (224 lb 13.9 oz) IBW/kg (Calculated) : 77.6 Heparin Dosing Weight: 96.6 kg  Labs: Recent Labs    06/08/22 1201 06/08/22 1253 06/08/22 2028 06/09/22 0415 06/10/22 0532 06/10/22 1529 06/10/22 1618 06/10/22 2155 06/11/22 0419 06/11/22 0739  HGB  --   --   --    < > 15.4 14.3  --   --  12.9*  --   HCT  --   --   --    < > 48.0 44.1  --   --  38.8*  --   PLT  --   --   --    < > 154 167  --   --  157  --   LABPROT  --   --  14.5  --   --   --   --   --   --   --   INR  --   --  1.1  --   --   --   --   --   --   --   HEPARINUNFRC  --   --   --   --   --   --   --  0.79*  --  0.66  CREATININE 1.69*  --   --    < > 3.95* 5.29*  --   --  6.32*  --   CKTOTAL  --   --   --   --   --   --  1,611*  --   --   --   TROPONINIHS 15 16  --   --   --   --   --   --   --   --    < > = values in this interval not displayed.     Estimated Creatinine Clearance: 12.1 mL/min (A) (by C-G formula based on SCr of 6.32 mg/dL (H)).   Medical History: Past Medical History:  Diagnosis Date   Adopted    Cataract    resolved with surgery   Diabetes mellitus    Dyslipidemia    Erectile dysfunction    Herpes labialis    Upper Lip   Hyperlipidemia    Male hypogonadism    Prostate cancer ([place])    Renal stones    Thyroid nodule    Right   Assessment: 76 y.o. male w/ PMH of  DM, BPH, prostate cancer, kidney stones s/p lithotripsy admitted on 06/08/2022 with acute pancreatitis and portal vein thrombosis. No chronic anticoagulation prior to arrival. H&H, platelets stable at baseline, INR 1.1  Goal of Therapy:  Heparin level 0.3-0.7 units/ml Monitor platelets by anticoagulation protocol: Yes  6/15 2155 HL 0.79, supratherapeutic 6/16 0739 HL 0.66, therapeutic x 1   Plan:  --Heparin level is therapeutic x  1 --Continue heparin infusion at 1600 units/hr --Recheck confirmatory HL in 8 hr --CBC daily while on heparin  Benita Gutter  06/11/2022 9:13 AM

## 2022-06-11 NOTE — Progress Notes (Signed)
NAME:  Xavier Esparza, MRN:  [id number], DOB:  1946-06-08, LOS: 3 ADMISSION DATE:  06/08/2022, CONSULTATION DATE: 06/11/22 REFERRING MD: Dr. Priscella Mann, CHIEF COMPLAINT: Loss of Consciousness    History of Present Illness:  This is a 76 yo male who presented to [facility] ER on 06/13 from home via EMS following a syncopal episode.  The pt does have a recent hx of hematuria and was evaluated by Urologist Dr. Yves Dill in the outpatient setting on 06/12.  During outpatient visit labs were obtained and pt prescribed levaquin in preparation for a cystoscopy.  He took a total of 2 doses, and on the morning of 06/13 the pts wife heard a loud thud, and found the pt on the bathroom floor.  She reported when she arrived in the bathroom the pt was awake, but unable to get himself off the floor prompting EMS notification.  Upon EMS arrival pt hypotensive bp 76/45, he received IM epinephrine and 400 ml IV fluid bolus  ED Course Upon arrival to the ER pt c/o abdominal pain with associated vomiting.  CT Abd Pelvis w contrast concerned moderate acute pancreatitis, no evidence of pancreatic necrosis or pseudocyst; thrombosis of the anterior right portal vein possible neoplasm or abscess.  CXR revealed atelectasis.  ED provider consulted GI physician Dr. Vicente Males who recommended MRI Abd and determined pt would not need ERCP due to high location of hepatic lesion.  Pt received iv zosyn and heparin gtt initiated.  Pt admitted to the stepdown unit per hospitalist team for additional workup and treatment.  PCCM team consulted 06/15 to assist with treatment.    Pertinent  Medical History  Type II Diabetes Mellitus Dyslipidemia  Erectile Dysfunction Herpes Labialis HLD Hyperlipidemia  Prostate Cancer Renal Stones Thyroid Nodule   Significant Hospital Events: Including procedures, antibiotic start and stop dates in addition to other pertinent events   6/14: admitted for severe sepsis and pancreatitis with lactic acidosis 6/15: PCCM  consulted for worsening pancreatitis acidosis renal failure and closer monitoring 6/16: Pt with worsening acute kidney injury nephrology consulted recommended hemodialysis  6/16: CT Abd Pelvis Increased ascites and acute peripancreatic        fluid collections. No gas is identified within the pancreatic           parenchyma to suggest superinfection of the prior pancreatic        necrosis with gas-forming organism at this time. Today's exam         performed without IV contrast and accordingly assessment for          further pancreatic necrosis is not feasible. Delayed nephrogram        with contrast in the kidneys left over from the studies from 2-3       days ago. Appearance compatible with acute renal failure. Small         but increased bilateral pleural effusions with passive atelectasis.      Roughly similar distribution of heterogeneous density in the      right hepatic lobe corresponding to the prior region of portal         vein thrombosis and suspected microabscesses. Two       nonobstructive right renal calculi are observed. Other imaging          findings of potential clinical significance: Aortic Atherosclerosis         (ICD10-I70.0). Brachytherapy seed implants in the prostate gland.       Multilevel lumbar impingement. Small type 1 hiatal  hernia.       Descending and sigmoid colon diverticulosis. Secondary      inflammation of the descending and transverse duodenum.  Interim History / Subjective:  Pt c/o 10/10 back pain despite 1 mg of prn dilaudid.  He also has intermittent confusion.    Objective   Blood pressure 123/86, pulse 97, temperature 98.1 F (36.7 C), temperature source Oral, resp. rate (!) 28, height 6' (1.829 m), weight 102 kg, SpO2 94 %.        Intake/Output Summary (Last 24 hours) at 06/11/2022 0823 Last data filed at 06/11/2022 0600 Gross per 24 hour  Intake 1613.23 ml  Output 250 ml  Net 1363.23 ml   Filed Weights   06/08/22 1046 06/10/22 1610   Weight: 96.6 kg 102 kg    Examination: General: Acutely ill appearing male, 10/10 back pain  HENT: Supple, no JVD  Lungs: Faint crackles throughout, even, non labored  Cardiovascular: Sinus tachycardia, no r/g, 2+ radial/2+ distal pulses, no edema  Abdomen: Faint BS x4, distended, taut, tenderness present  Extremities: Normal bulk and tone, moves all extremities  Neuro: Disoriented to situation at times, follows commands, PERRLA GU: Indwelling foley catheter draining dark yellow urine   Resolved Hospital Problem list     Assessment & Plan:  Severe necrotizing pancreatitis  Portal vein thrombosis~CT Abd Pelvis 06/13 concerning for anterior right portal vein possible neoplasm or abscess Transaminitis  - Trend WBC and monitor fever curve  - Follow cultures  - Continue zosyn per ID recommendations  - General surgery and GI consulted appreciate input  - Keep NPO for now defer to GI and General Surgery  - Trend hepatic function panel  - Continue heparin gtt dosing per pharmacy   Syncopal episode possibly due to vasovagal episode/hypotension - Continuous telemetry monitoring  - Gentle iv maintenance fluid to maintain map >65  Acute kidney injury with lactic and metabolic acidosis Rhabdomyolysis   Hypocalcemia  - Trend BMP, CK, and lactic acid  - Replace electrolytes as indicated  - Monitor UOP - Nephrology consulted appreciate input: recommending initiation of hemodialysis   Type II diabetes mellitus  - CBG's q4hrs - SSI   Acute pain  - Prn dilaudid for pain management   Best Practice (right click and "Reselect all SmartList Selections" daily)   Diet/type: NPO DVT prophylaxis: LMWH GI prophylaxis: N/A Lines: N/A Foley:  Yes, and it is still needed Code Status:  full code Last date of multidisciplinary goals of care discussion [06/11/2022]  Updated pts wife via telephone regarding pt condition and current plan of care  Labs   CBC: Recent Labs  Lab 06/08/22 1057  06/09/22 0415 06/10/22 0532 06/10/22 1529 06/11/22 0419  WBC 19.8* 11.4* 12.6* 14.2* 13.3*  NEUTROABS 15.6* 9.7*  --   --  11.2*  HGB 15.9 14.3 15.4 14.3 12.9*  HCT 50.2 43.9 48.0 44.1 38.8*  MCV 85.5 84.1 83.8 84.0 82.4  PLT 222 157 154 167 295    Basic Metabolic Panel: Recent Labs  Lab 06/08/22 1201 06/08/22 1253 06/09/22 0415 06/10/22 0532 06/10/22 1529 06/10/22 1700 06/11/22 0419  NA 137  --  136 137 137  --  137  K HEMOLYZED SPECIMEN UNABLE TO OBTAIN RESULT 3.4* 4.8 5.1 4.8  --  4.3  CL 96*  --  100 106 102  --  101  CO2 25  --  26 21* 23  --  26  GLUCOSE 160*  --  140* 126* 131*  --  138*  BUN 22  --  38* 50* 67*  --  78*  CREATININE 1.69*  --  1.96* 3.95* 5.29*  --  6.32*  CALCIUM 9.6  --  8.2* 5.7* 5.6*  --  4.3*  PHOS  --   --   --   --   --  3.4  --    GFR: Estimated Creatinine Clearance: 12.1 mL/min (A) (by C-G formula based on SCr of 6.32 mg/dL (H)). Recent Labs  Lab 06/09/22 0415 06/10/22 0532 06/10/22 0938 06/10/22 1529 06/10/22 1813 06/11/22 0419  PROCALCITON 6.81 19.36  --   --   --   --   WBC 11.4* 12.6*  --  14.2*  --  13.3*  LATICACIDVEN  --  2.9* 3.5* 4.0* 2.4*  --     Liver Function Tests: Recent Labs  Lab 06/08/22 1810 06/09/22 0415 06/10/22 0532 06/11/22 0419  AST 1,756* 1,895* 593* 262*  ALT 530* 621* 374* 218*  ALKPHOS 54 57 44 41  BILITOT 1.9* 1.4* 1.7* 1.5*  PROT 6.8 6.1* 5.6* 5.4*  ALBUMIN 3.6 3.4* 2.8* 2.7*   Recent Labs  Lab 06/08/22 1810 06/09/22 0415 06/10/22 0532  LIPASE 1,633* 1,741* 688*   Recent Labs  Lab 06/10/22 1900  AMMONIA 13    ABG    Component Value Date/Time   PHART 7.41 06/10/2022 1530   PCO2ART 31 (L) 06/10/2022 1530   PO2ART 65 (L) 06/10/2022 1530   HCO3 19.6 (L) 06/10/2022 1530   ACIDBASEDEF 4.0 (H) 06/10/2022 1530   O2SAT 94.9 06/10/2022 1530     Coagulation Profile: Recent Labs  Lab 06/08/22 2028  INR 1.1    Cardiac Enzymes: Recent Labs  Lab 06/10/22 1618  CKTOTAL 1,611*     HbA1C: Hgb A1c MFr Bld  Date/Time Value Ref Range Status  01/08/2022 10:34 AM 6.2 4.6 - 6.5 % Final    Comment:    Glycemic Control Guidelines for People with Diabetes:Non Diabetic:  <6%Goal of Therapy: <7%Additional Action Suggested:  >8%   07/02/2021 08:46 AM 6.0 4.6 - 6.5 % Final    Comment:    Glycemic Control Guidelines for People with Diabetes:Non Diabetic:  <6%Goal of Therapy: <7%Additional Action Suggested:  >8%     CBG: Recent Labs  Lab 06/10/22 1617 06/10/22 1932 06/10/22 2320 06/11/22 0329 06/11/22 0731  GLUCAP 140* 136* 140* 136* 146*    Review of Systems: Positives in BOLD  Gen: Denies fever, chills, weight change, fatigue, night sweats HEENT: Denies blurred vision, double vision, hearing loss, tinnitus, sinus congestion, rhinorrhea, sore throat, neck stiffness, dysphagia PULM: Denies shortness of breath, cough, sputum production, hemoptysis, wheezing CV: Denies chest pain, edema, orthopnea, paroxysmal nocturnal dyspnea, palpitations GI: back/abdominal pain, nausea, vomiting, diarrhea, hematochezia, melena, constipation, change in bowel habits GU: Denies dysuria, hematuria, polyuria, oliguria, urethral discharge Endocrine: Denies hot or cold intolerance, polyuria, polyphagia or appetite change Derm: Denies rash, dry skin, scaling or peeling skin change Heme: Denies easy bruising, bleeding, bleeding gums Neuro: Denies headache, numbness, weakness, slurred speech, loss of memory or consciousness  Past Medical History:  He,  has a past medical history of Adopted, Cataract, Diabetes mellitus, Dyslipidemia, Erectile dysfunction, Herpes labialis, Hyperlipidemia, Male hypogonadism, Prostate cancer ([place]), Renal stones, and Thyroid nodule.   Surgical History:   Past Surgical History:  Procedure Laterality Date   CATARACT EXTRACTION, BILATERAL  2017   Dr. Katy Fitch   COLONOSCOPY WITH PROPOFOL N/A 03/09/2018   Procedure: COLONOSCOPY WITH PROPOFOL;  Surgeon: Lin Landsman,  MD;  Location: [facility];  Service: Gastroenterology;  Laterality: N/A;   COLONOSCOPY WITH PROPOFOL N/A 03/12/2021   Procedure: COLONOSCOPY WITH PROPOFOL;  Surgeon: Lin Landsman, MD;  Location: [place];  Service: Endoscopy;  Laterality: N/A;  priority 4   CYSTOSCOPY W/ URETERAL STENT PLACEMENT  08/09/2017   Procedure: CYSTOSCOPY WITH RETROGRADE PYELOGRAM/URETERAL STENT PLACEMENT;  Surgeon: Royston Cowper, MD;  Location: [facility];  Service: Urology;;   EXTRACORPOREAL SHOCK WAVE LITHOTRIPSY Left 09/15/2017   Procedure: EXTRACORPOREAL SHOCK WAVE LITHOTRIPSY (ESWL);  Surgeon: Royston Cowper, MD;  Location: [facility];  Service: Urology;  Laterality: Left;   EYE SURGERY Bilateral 2016   cataract extractions   GREEN LIGHT LASER TURP (TRANSURETHRAL RESECTION OF PROSTATE N/A 04/20/2016   Procedure: GREEN LIGHT LASER TURP (TRANSURETHRAL RESECTION OF PROSTATE;  Surgeon: Royston Cowper, MD;  Location: [facility];  Service: Urology;  Laterality: N/A;   INGUINAL HERNIA REPAIR  01/11/2012   Procedure: HERNIA REPAIR INGUINAL ADULT;  Surgeon: Pedro Earls, MD;  Location: [place];  Service: General;  Laterality: Right;  Open right inguinal hernia repair with mesh   POLYPECTOMY  03/12/2021   Procedure: POLYPECTOMY;  Surgeon: Lin Landsman, MD;  Location: [place];  Service: Endoscopy;;   RADIOACTIVE SEED IMPLANT     PROSTATE   URETEROSCOPY WITH HOLMIUM LASER LITHOTRIPSY Right 08/09/2017   Procedure: URETEROSCOPY WITH HOLMIUM LASER LITHOTRIPSY;  Surgeon: Royston Cowper, MD;  Location: [facility];  Service: Urology;  Laterality: Right;     Social History:   reports that he quit smoking about 31 years ago. His smoking use included cigarettes. He has never used smokeless tobacco. He reports current alcohol use. He reports that he does not use drugs.   Family History:  His family history is not on file. He was adopted.   Allergies No Known  Allergies   Home Medications  Prior to Admission medications   Medication Sig Start Date End Date Taking? Authorizing Provider  acyclovir (ZOVIRAX) 800 MG tablet TAKE 1 TABLET DAILY 01/25/22  Yes Tonia Ghent, MD  aspirin EC 81 MG tablet Take 81 mg by mouth daily. Swallow whole.   Yes [provider]  Calcium Carbonate (CALCIUM 600 PO) Take 2 tablets by mouth daily.   Yes [provider]  GLUCOSAMINE HCL PO Take 1 tablet by mouth daily.    Yes [provider]  losartan (COZAAR) 25 MG tablet Take 1 tablet (25 mg total) by mouth daily. 12/29/21  Yes Tower, Wynelle Fanny, MD  Magnesium 500 MG TABS Take 1 tablet by mouth daily.   Yes [provider]  metFORMIN (GLUCOPHAGE) 500 MG tablet TAKE 1 TABLET DAILY 01/25/22  Yes Tonia Ghent, MD  Multiple Vitamin (MULTIVITAMIN) tablet Take 1 tablet by mouth daily. Reported on 01/07/2016   Yes [provider]  Omega-3 Fatty Acids ([employer] FISH OIL PO) Take 1,000 mg by mouth daily.   Yes [provider]  Red Yeast Rice 600 MG TABS Take 2 tablets (1,200 mg total) by mouth daily. 01/08/22  Yes Tonia Ghent, MD  rosuvastatin (CRESTOR) 20 MG tablet TAKE 1 TABLET EVERY OTHER  DAY IN THE MORNING. NEEDS TO CALL OFFICE TO SCHEDULE YEARLY EXAM 04/12/22  Yes Tonia Ghent, MD  Testosterone 1.62 % GEL Apply 2 Pump topically daily. 05/12/22  Yes [provider]     Critical care time: 60 minutes      Donell Beers, AGNP  Pulmonary/Critical Care Pager [phone] (please enter 7 digits) PCCM Consult Pager [phone] (please enter 7 digits)

## 2022-06-11 NOTE — Progress Notes (Signed)
[facility]  ROUNDING NOTE   Subjective:   Patient seen and evaluated at bedside in ICU Alert and oriented NPO Room air Complains of abd discomfort Foley catheter  Creatinine 6.99 BUN 85 UOP 250 on day shift  Objective:  Vital signs in last 24 hours:  Temp:  [97.7 F (36.5 C)-98.6 F (37 C)] 98.3 F (36.8 C) (06/16 1200) Pulse Rate:  [86-110] 100 (06/16 1300) Resp:  [16-32] 22 (06/16 1200) BP: (91-139)/(64-89) 123/89 (06/16 1300) SpO2:  [91 %-100 %] 91 % (06/16 1300) Weight:  [102 kg] 102 kg (06/15 1610)  Weight change:  Filed Weights   06/08/22 1046 06/10/22 1610  Weight: 96.6 kg 102 kg    Intake/Output: I/O last 3 completed shifts: In: 2613.2 [I.V.:2402.1; IV Piggyback:211.2] Out: 250 [Urine:250]   Intake/Output this shift:  Total I/O In: 722.3 [I.V.:702.3; Other:20] Out: 225 [Urine:225]  Physical Exam: General: NAD, restless  Head: Normocephalic, atraumatic. Moist oral mucosal membranes  Eyes: Anicteric  Lungs:  Clear to auscultation, normal effort  Heart: Regular rate and rhythm  Abdomen:  Soft, grimace to deep palpation  Extremities:  No peripheral edema.  Neurologic: Nonfocal, moving all four extremities  Skin: No lesions  Access: None    Basic Metabolic Panel: Recent Labs  Lab 06/09/22 0415 06/10/22 0532 06/10/22 1529 06/10/22 1700 06/11/22 0419 06/11/22 1147  NA 136 137 137  --  137 137  K 4.8 5.1 4.8  --  4.3 4.2  CL 100 106 102  --  101 99  CO2 26 21* 23  --  26 25  GLUCOSE 140* 126* 131*  --  138* 131*  BUN 38* 50* 67*  --  78* 85*  CREATININE 1.96* 3.95* 5.29*  --  6.32* 6.99*  CALCIUM 8.2* 5.7* 5.6*  --  4.3* <4.0*  PHOS  --   --   --  3.4  --   --     Liver Function Tests: Recent Labs  Lab 06/08/22 1810 06/09/22 0415 06/10/22 0532 06/11/22 0419  AST 1,756* 1,895* 593* 262*  ALT 530* 621* 374* 218*  ALKPHOS 54 57 44 41  BILITOT 1.9* 1.4* 1.7* 1.5*  PROT 6.8 6.1* 5.6* 5.4*  ALBUMIN 3.6 3.4* 2.8* 2.7*    Recent Labs  Lab 06/08/22 1810 06/09/22 0415 06/10/22 0532  LIPASE 1,633* 1,741* 688*   Recent Labs  Lab 06/10/22 1900  AMMONIA 13    CBC: Recent Labs  Lab 06/08/22 1057 06/09/22 0415 06/10/22 0532 06/10/22 1529 06/11/22 0419  WBC 19.8* 11.4* 12.6* 14.2* 13.3*  NEUTROABS 15.6* 9.7*  --   --  11.2*  HGB 15.9 14.3 15.4 14.3 12.9*  HCT 50.2 43.9 48.0 44.1 38.8*  MCV 85.5 84.1 83.8 84.0 82.4  PLT 222 157 154 167 157    Cardiac Enzymes: Recent Labs  Lab 06/10/22 1618 06/11/22 0419  CKTOTAL 1,611* 1,740*    BNP: Invalid input(s): "POCBNP"  CBG: Recent Labs  Lab 06/10/22 1932 06/10/22 2320 06/11/22 0329 06/11/22 0731 06/11/22 1110  GLUCAP 136* 140* 136* 146* 140*    Microbiology: Results for orders placed or performed during the hospital encounter of 06/08/22  Blood culture (routine x 2)     Status: None (Preliminary result)   Collection Time: 06/08/22  1:33 PM   Specimen: BLOOD  Result Value Ref Range Status   Specimen Description BLOOD LEFT HAND  Final   Special Requests   Final    BOTTLES DRAWN AEROBIC AND ANAEROBIC Blood Culture results may  not be optimal due to an inadequate volume of blood received in culture bottles   Culture   Final    NO GROWTH 3 DAYS Performed at [facility], [address] [place], [place] [address]    Report Status PENDING  Incomplete  Blood culture (routine x 2)     Status: None (Preliminary result)   Collection Time: 06/08/22  2:15 PM   Specimen: BLOOD  Result Value Ref Range Status   Specimen Description BLOOD BLOOD LEFT WRIST  Final   Special Requests   Final    BOTTLES DRAWN AEROBIC AND ANAEROBIC Blood Culture results may not be optimal due to an inadequate volume of blood received in culture bottles   Culture   Final    NO GROWTH 3 DAYS Performed at [facility], [address], [place], [place] [address]    Report Status PENDING  Incomplete  MRSA Next Gen by PCR, Nasal     Status: None    Collection Time: 06/10/22  4:14 PM   Specimen: Nasal Mucosa; Nasal Swab  Result Value Ref Range Status   MRSA by PCR Next Gen NOT DETECTED NOT DETECTED Final    Comment: (NOTE) The GeneXpert MRSA Assay (FDA approved for NASAL specimens only), is one component of a comprehensive MRSA colonization surveillance program. It is not intended to diagnose MRSA infection nor to guide or monitor treatment for MRSA infections. Test performance is not FDA approved in patients less than 30 years old. Performed at [facility], [address] [place], [place] [address]   SARS Coronavirus 2 by RT PCR (hospital order, performed in [facility] hospital lab) *cepheid single result test* Anterior Nasal Swab     Status: None   Collection Time: 06/10/22  5:17 PM   Specimen: Anterior Nasal Swab  Result Value Ref Range Status   SARS Coronavirus 2 by RT PCR NEGATIVE NEGATIVE Final    Comment: (NOTE) SARS-CoV-2 target nucleic acids are NOT DETECTED.  The SARS-CoV-2 RNA is generally detectable in upper and lower respiratory specimens during the acute phase of infection. The lowest concentration of SARS-CoV-2 viral copies this assay can detect is 250 copies / mL. A negative result does not preclude SARS-CoV-2 infection and should not be used as the sole basis for treatment or other patient management decisions.  A negative result may occur with improper specimen collection / handling, submission of specimen other than nasopharyngeal swab, presence of viral mutation(s) within the areas targeted by this assay, and inadequate number of viral copies (<250 copies / mL). A negative result must be combined with clinical observations, patient history, and epidemiological information.  Fact Sheet for Patients:   https://www.patel.info/  Fact Sheet for Healthcare Providers: https://hall.com/  This test is not yet approved or  cleared by the [place] FDA  and has been authorized for detection and/or diagnosis of SARS-CoV-2 by FDA under an Emergency Use Authorization (EUA).  This EUA will remain in effect (meaning this test can be used) for the duration of the COVID-19 declaration under Section 564(b)(1) of the Act, 21 U.S.C. section 360bbb-3(b)(1), unless the authorization is terminated or revoked sooner.  Performed at [facility], [address] [place],  [address]     Coagulation Studies: Recent Labs    06/08/22 03/23/2027  LABPROT 14.5  INR 1.1    Urinalysis: Recent Labs    06/08/22 1811  COLORURINE AMBER*  LABSPEC 1.015  PHURINE 6.0  GLUCOSEU 50*  HGBUR LARGE*  BILIRUBINUR NEGATIVE  KETONESUR NEGATIVE  PROTEINUR 100*  NITRITE NEGATIVE  LEUKOCYTESUR NEGATIVE      Imaging: CT ABDOMEN PELVIS WO CONTRAST  Result Date: 06/11/2022 CLINICAL DATA:  Sepsis, abdominal pain. History of prostate cancer. History of renal calculi. Syncope. Pancreatitis with focal pancreatic necrosis on 06/08/2022 MRI. 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 EXAM: CT ABDOMEN AND PELVIS WITHOUT CONTRAST TECHNIQUE: Multidetector CT imaging of the abdomen and pelvis was performed following the standard protocol without IV contrast. RADIATION DOSE REDUCTION: This exam was performed according to the departmental dose-optimization program which includes automated exposure control, adjustment of the mA and/or kV according to patient size and/or use of iterative reconstruction technique. COMPARISON:  Multiple exams, including MRI 06/09/2022 and 06/08/2022 FINDINGS: Lower chest: Small but increased bilateral pleural effusions with passive atelectasis. Coronary artery atherosclerotic calcifications. Hepatobiliary: Heterogeneous density in the right hepatic lobe corresponding to the prior region of portal vein thrombosis and suspected micro abscesses. Scattered hepatic fluid density lesions compatible with  cysts throughout the rest of the liver. High density material in the gallbladder, possibly sludge. Probable sludge in the nondilated common bile duct. Pancreas: Substantial peripancreatic edema. No gas density is identified within the pancreatic parenchyma or previous regions of pancreatic necrosis to indicate super infection with gas-forming organism. Acute peripancreatic fluid collections appear increased and there is increased surrounding mesenteric edema and fluid tracking along the paracolic gutters and anterior margins the perirenal fascia. Spleen: Unremarkable Adrenals/Urinary Tract: Two nonobstructive right renal calculi are observed, the larger measuring 0.7 cm in the right kidney lower pole on image 68 series 5. Benign 4.4 cm left kidney lower pole cyst. There is substantial contrast medium in the renal parenchyma bilaterally on today's noncontrast examination, indicating persistent nephrograms compatible with acute renal failure. This is likely left over from prior contrast enhanced studies from 06/08/2022 and possibly 06/09/2022. Correlate with urine production. Stomach/Bowel: Small type 1 hiatal hernia. Indistinct descending and transverse duodenum likely from secondary inflammation. Scattered colonic diverticula. Descending and sigmoid colon diverticulosis. Vascular/Lymphatic: Atherosclerosis is present, including aortoiliac atherosclerotic disease. Reproductive: Brachytherapy seed implants in the prostate gland. Other: Increased ascites likely related to the pancreatitis. There is also increasing presacral edema. Musculoskeletal: Lumbar spondylosis and degenerative disc disease causing multilevel impingement. IMPRESSION: 1. Increased ascites and acute peripancreatic fluid collections. No gas is identified within the pancreatic parenchyma to suggest superinfection of the prior pancreatic necrosis with gas-forming organism at this time. Today's exam is performed without IV contrast and accordingly  assessment for further pancreatic necrosis is not feasible. 2. Delayed nephrogram with contrast in the kidneys left over from the studies from 2-3 days ago. Appearance compatible with acute renal failure. 3. Small but increased bilateral pleural effusions with passive atelectasis. 4. 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 5. Two nonobstructive right renal calculi are observed. 6. Other imaging findings of potential clinical significance: Aortic Atherosclerosis (ICD10-I70.0). Brachytherapy seed implants in the prostate gland. Multilevel lumbar impingement. Small type 1 hiatal hernia. Descending and sigmoid colon diverticulosis. Secondary inflammation of the descending and transverse duodenum. Electronically Signed   By: Van Clines M.D.   On: 06/11/2022 09:34   US RENAL  Result Date: 06/10/2022 CLINICAL DATA:  Acute kidney injury EXAM: RENAL / URINARY TRACT ULTRASOUND COMPLETE COMPARISON:  No prior ultrasound, correlation is made with CT abdomen pelvis and MRI abdomen 06/08/2022 FINDINGS: Right Kidney:  Renal measurements: 11.6 x 5.6 x 5.2 cm = volume: 172 mL. Echogenicity within normal limits. No mass or hydronephrosis visualized. Left Kidney: Renal measurements: 12.9 x 5.8 x 5.1 cm = volume: 201 mL. Echogenicity within normal limits. No hydronephrosis visualized. Cystic lesion in the mid left kidney measures up to 4.4 x 4.7 x 5.1 cm, consistent with a simple cyst, as seen on the 06/08/2022 CT and MRI Bladder: Appears normal for degree of bladder distention. Other: Small amount of ascites. IMPRESSION: 1. No acute finding in the kidneys. 2. Small volume ascites. Electronically Signed   By: Merilyn Baba M.D.   On: 06/10/2022 12:21   MR ABDOMEN MRCP WO CONTRAST  Result Date: 06/09/2022 CLINICAL DATA:  A 76 year old male presents for follow-up of imaging that was acquired  earlier on the same date to assess the biliary tree in the setting of acute necrotic pancreatitis. Please see earlier MRI for further details regarding pancreatitis and associated findings. EXAM: MRI ABDOMEN WITHOUT CONTRAST  (INCLUDING MRCP) TECHNIQUE: Multiplanar multisequence MR imaging of the abdomen was performed. Heavily T2-weighted images of the biliary and pancreatic ducts were obtained, and three-dimensional MRCP images were rendered by post processing. COMPARISON:  MRI of the same date for which this study was done to complete the evaluation of the biliary tree. FINDINGS: Lower chest: Effusions partially visualized in the lower chest. Not well assessed. Hepatobiliary: Hepatic findings with hepatic lesions and portal venous thrombosis as well as hepatic cysts better outlined on previous MRI evaluation. There is sludge in the gallbladder. There is gallbladder wall edema as outlined on previous imaging. There is no discrete evidence of choledocholithiasis. The biliary tree is nondilated. The pancreatic duct is nondilated. Pancreas: Pancreatic edema as seen on recent imaging better displayed on recent MR evaluation performed earlier the same day. Spleen:  Not well evaluated better evaluated on previous imaging. Adrenals/Urinary Tract: Similarly with limited assessment on this dedicated MRCP. Stomach/Bowel: Edema of the stomach and adjacent small bowel also better displayed on recent dedicated abdominal MRI. Vascular/Lymphatic:  Not well evaluated Other: Extensive edema throughout the upper abdomen with signs of perihepatic ascites. Musculoskeletal: Not well evaluated IMPRESSION: No signs of choledocholithiasis without dilation of the biliary tree. See dedicated abdominal MRI for further details regarding liver and pancreas as well as associated findings of acute necrotic pancreatitis. Electronically Signed   By: Zetta Bills M.D.   On: 06/09/2022 15:54   MR 3D Recon At Scanner  Result Date:  06/09/2022 CLINICAL DATA:  A 76 year old male presents for follow-up of imaging that was acquired earlier on the same date to assess the biliary tree in the setting of acute necrotic pancreatitis. Please see earlier MRI for further details regarding pancreatitis and associated findings. EXAM: MRI ABDOMEN WITHOUT CONTRAST  (INCLUDING MRCP) TECHNIQUE: Multiplanar multisequence MR imaging of the abdomen was performed. Heavily T2-weighted images of the biliary and pancreatic ducts were obtained, and three-dimensional MRCP images were rendered by post processing. COMPARISON:  MRI of the same date for which this study was done to complete the evaluation of the biliary tree. FINDINGS: Lower chest: Effusions partially visualized in the lower chest. Not well assessed. Hepatobiliary: Hepatic findings with hepatic lesions and portal venous thrombosis as well as hepatic cysts better outlined on previous MRI evaluation. There is sludge in the gallbladder. There is gallbladder wall edema as outlined on previous imaging. There is no discrete evidence of choledocholithiasis. The biliary tree is nondilated. The pancreatic duct is nondilated. Pancreas: Pancreatic edema as seen on  recent imaging better displayed on recent MR evaluation performed earlier the same day. Spleen:  Not well evaluated better evaluated on previous imaging. Adrenals/Urinary Tract: Similarly with limited assessment on this dedicated MRCP. Stomach/Bowel: Edema of the stomach and adjacent small bowel also better displayed on recent dedicated abdominal MRI. Vascular/Lymphatic:  Not well evaluated Other: Extensive edema throughout the upper abdomen with signs of perihepatic ascites. Musculoskeletal: Not well evaluated IMPRESSION: No signs of choledocholithiasis without dilation of the biliary tree. See dedicated abdominal MRI for further details regarding liver and pancreas as well as associated findings of acute necrotic pancreatitis. Electronically Signed   By:  Zetta Bills M.D.   On: 06/09/2022 15:54   NM Pulmonary Perfusion  Result Date: 06/09/2022 CLINICAL DATA:  Chest pain EXAM: NUCLEAR MEDICINE PERFUSION LUNG SCAN TECHNIQUE: Perfusion images were obtained in multiple projections after intravenous injection of radiopharmaceutical. Ventilation scans intentionally deferred if perfusion scan and chest x-ray adequate for interpretation during COVID 19 epidemic. RADIOPHARMACEUTICALS:  3.96 mCi Tc-61mMAA IV COMPARISON:  Chest x-ray dated June 08, 2022 FINDINGS: No large segmental perfusion defects. Small subsegmental perfusion defects seen in the right lung. IMPRESSION: Nondiagnostic (low or intermediate probability) Electronically Signed   By: LYetta GlassmanM.D.   On: 06/09/2022 14:25     Medications:    lactated ringers 100 mL/hr at 06/11/22 1300   piperacillin-tazobactam (ZOSYN)  IV 2.25 g (06/11/22 1325)    Chlorhexidine Gluconate Cloth  6 each Topical Q0600   insulin aspart  0-15 Units Subcutaneous Q4H   midazolam  1 mg Intravenous Once   acetaminophen **OR** acetaminophen, alteplase, heparin, HYDROmorphone (DILAUDID) injection, lidocaine (PF), lidocaine-prilocaine, metoCLOPramide (REGLAN) injection, oxyCODONE, pentafluoroprop-tetrafluoroeth, prochlorperazine  Assessment/ Plan:  Mr. JKNOAH NEDEAUis a 76y.o.  male with past medical history consistent of prostate cancer, diabetes, recurrent kidney stones with lithotripsy and BPH, who [facility] for Portal vein thrombosis [I81] Lactic acidosis [E87.20] Necrotizing pancreatitis [K85.91] Liver mass [R16.0] Syncope, unspecified syncope type [R55] Leukocytosis, unspecified type [D72.829] Acute pancreatitis, unspecified complication status, unspecified pancreatitis type [K85.90]   Acute kidney injury with hematuria likely secondary to severe illness and IV contrast exposure.  Normal kidney function prior to admission.  IV contrast exposure on 06/08/2022 and 06/09/2022.   Renal ultrasound negative for obstruction.  Urinalysis shows hematuria and mild proteinuria.  No acute indication for dialysis at this time.  Renal function continues to decline along with urine output. Mentation remains intact. No lower extremity edema. Discussed with patient and wife his declining renal function and that temporary dialysis is needed. We discussed all risk and benefits of dialysis and patient is agreeable to proceed with treatment. Critical care team will place HD access and patient will proceed with dialysis treatment. Will monitor patient daily to determine need of dialysis.    Lab Results  Component Value Date   CREATININE 6.99 (H) 06/11/2022   CREATININE 6.32 (H) 06/11/2022   CREATININE 5.29 (H) 06/10/2022    Intake/Output Summary (Last 24 hours) at 06/11/2022 1359 Last data filed at 06/11/2022 1300 Gross per 24 hour  Intake 2335.54 ml  Output 225 ml  Net 2110.54 ml   2.  Acute pancreatitis, severe necrotizing.  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  Currently n.p.o.  Supportive care with antiemetics and pain control.  High rate IV fluids.  IV antibiotics prescribed.   3.  Diabetes mellitus type 2 without complication.Home regimen includes metformin.  Hemoglobin A1c  6.2 on 01/08/2022.  Glucose control managed by primary team.    LOS: 3   6/16/20231:59 PM

## 2022-06-11 NOTE — Procedures (Signed)
Central Venous Catheter Insertion Procedure Note  Xavier Esparza  [id number]  04/13/1946  Date:06/11/22  Time:2:11 PM   Provider Performing:Jevan Gaunt Micheline Chapman   Procedure: Insertion of Non-tunneled Central Venous Catheter(36556)with US guidance (63893)    Indication(s) Hemodialysis  Consent Risks of the procedure as well as the alternatives and risks of each were explained to the patient and/or caregiver.  Consent for the procedure was obtained and is signed in the bedside chart  Anesthesia Topical only with 1% lidocaine   Timeout Verified patient identification, verified procedure, site/side was marked, verified correct patient position, special equipment/implants available, medications/allergies/relevant history reviewed, required imaging and test results available.  Sterile Technique Maximal sterile technique including full sterile barrier drape, hand hygiene, sterile gown, sterile gloves, mask, hair covering, sterile ultrasound probe cover (if used).  Procedure Description Area of catheter insertion was cleaned with chlorhexidine and draped in sterile fashion.   With real-time ultrasound guidance a HD catheter was placed into the left internal jugular vein.  Nonpulsatile blood flow and easy flushing noted in all ports.  The catheter was sutured in place and sterile dressing applied.  Complications/Tolerance None; patient tolerated the procedure well. Chest X-ray is ordered to verify placement for internal jugular or subclavian cannulation.  Chest x-ray is not ordered for femoral cannulation.  EBL Minimal  Specimen(s) None  Donell Beers, AGNP  Pulmonary/Critical Care Pager [phone] (please enter 7 digits) PCCM Consult Pager [phone] (please enter 7 digits)

## 2022-06-11 NOTE — Progress Notes (Signed)
Hemodialysis Post Treatment Note  Patient initiates hemodialysis, first treatment, scheduled for 2 hours. [facility] CVC functions well, BFR maintained. No  UF targeted this treatment. Patient hemodynamically stable during session. Although uncomfortable due to back discomfort, primary nurse medicated. No other issues, noted. Next HD session to be determined by the  Nephrology team.

## 2022-06-12 DIAGNOSIS — R55 Syncope and collapse: Secondary | ICD-10-CM | POA: Diagnosis not present

## 2022-06-12 DIAGNOSIS — A419 Sepsis, unspecified organism: Secondary | ICD-10-CM | POA: Diagnosis not present

## 2022-06-12 LAB — CBC WITH DIFFERENTIAL/PLATELET
Abs Immature Granulocytes: 0.11 10*3/uL — ABNORMAL HIGH (ref 0.00–0.07)
Basophils Absolute: 0.1 10*3/uL (ref 0.0–0.1)
Basophils Relative: 0 %
Eosinophils Absolute: 0 10*3/uL (ref 0.0–0.5)
Eosinophils Relative: 0 %
HCT: 33.6 % — ABNORMAL LOW (ref 39.0–52.0)
Hemoglobin: 11.2 g/dL — ABNORMAL LOW (ref 13.0–17.0)
Immature Granulocytes: 1 %
Lymphocytes Relative: 7 %
Lymphs Abs: 0.9 10*3/uL (ref 0.7–4.0)
MCH: 27.1 pg (ref 26.0–34.0)
MCHC: 33.3 g/dL (ref 30.0–36.0)
MCV: 81.2 fL (ref 80.0–100.0)
Monocytes Absolute: 1.3 10*3/uL — ABNORMAL HIGH (ref 0.1–1.0)
Monocytes Relative: 9 %
Neutro Abs: 11.5 10*3/uL — ABNORMAL HIGH (ref 1.7–7.7)
Neutrophils Relative %: 83 %
Platelets: 159 10*3/uL (ref 150–400)
RBC: 4.14 MIL/uL — ABNORMAL LOW (ref 4.22–5.81)
RDW: 16.6 % — ABNORMAL HIGH (ref 11.5–15.5)
WBC: 13.8 10*3/uL — ABNORMAL HIGH (ref 4.0–10.5)
nRBC: 0.1 % (ref 0.0–0.2)

## 2022-06-12 LAB — COMPREHENSIVE METABOLIC PANEL
ALT: 148 U/L — ABNORMAL HIGH (ref 0–44)
AST: 162 U/L — ABNORMAL HIGH (ref 15–41)
Albumin: 2.4 g/dL — ABNORMAL LOW (ref 3.5–5.0)
Alkaline Phosphatase: 51 U/L (ref 38–126)
Anion gap: 11 (ref 5–15)
BUN: 80 mg/dL — ABNORMAL HIGH (ref 8–23)
CO2: 25 mmol/L (ref 22–32)
Calcium: 4 mg/dL — CL (ref 8.9–10.3)
Chloride: 100 mmol/L (ref 98–111)
Creatinine, Ser: 6.76 mg/dL — ABNORMAL HIGH (ref 0.61–1.24)
GFR, Estimated: 8 mL/min — ABNORMAL LOW (ref >60)
Glucose, Bld: 156 mg/dL — ABNORMAL HIGH (ref 70–99)
Potassium: 4.2 mmol/L (ref 3.5–5.1)
Sodium: 136 mmol/L (ref 135–145)
Total Bilirubin: 0.9 mg/dL (ref 0.3–1.2)
Total Protein: 5.4 g/dL — ABNORMAL LOW (ref 6.5–8.1)

## 2022-06-12 LAB — C4 COMPLEMENT: Complement C4, Body Fluid: 20 mg/dL (ref 12–38)

## 2022-06-12 LAB — GLUCOSE, CAPILLARY
Glucose-Capillary: 152 mg/dL — ABNORMAL HIGH (ref 70–99)
Glucose-Capillary: 155 mg/dL — ABNORMAL HIGH (ref 70–99)
Glucose-Capillary: 171 mg/dL — ABNORMAL HIGH (ref 70–99)
Glucose-Capillary: 183 mg/dL — ABNORMAL HIGH (ref 70–99)
Glucose-Capillary: 188 mg/dL — ABNORMAL HIGH (ref 70–99)
Glucose-Capillary: 202 mg/dL — ABNORMAL HIGH (ref 70–99)

## 2022-06-12 LAB — FIBRINOGEN: Fibrinogen: 741 mg/dL — ABNORMAL HIGH (ref 210–475)

## 2022-06-12 LAB — PHOSPHORUS: Phosphorus: 4 mg/dL (ref 2.5–4.6)

## 2022-06-12 LAB — HEPATITIS B SURFACE ANTIBODY, QUANTITATIVE: Hep B S AB Quant (Post): <3.1 m[IU]/mL — ABNORMAL LOW (ref >9.9)

## 2022-06-12 LAB — PROTIME-INR
INR: 1.5 — ABNORMAL HIGH (ref 0.8–1.2)
Prothrombin Time: 17.7 seconds — ABNORMAL HIGH (ref 11.4–15.2)

## 2022-06-12 LAB — APTT: aPTT: 33 seconds (ref 24–36)

## 2022-06-12 LAB — MAGNESIUM: Magnesium: 1.8 mg/dL (ref 1.7–2.4)

## 2022-06-12 LAB — C3 COMPLEMENT: C3 Complement: 84 mg/dL (ref 82–167)

## 2022-06-12 LAB — ANA W/REFLEX IF POSITIVE: Anti Nuclear Antibody (ANA): NEGATIVE

## 2022-06-12 LAB — LIPASE, BLOOD: Lipase: 102 U/L — ABNORMAL HIGH (ref 11–51)

## 2022-06-12 MED ORDER — DEXTROSE 5 % IV SOLN
120.0000 mg | Freq: Once | INTRAVENOUS | Status: AC
  Administered 2022-06-12: 120 mg via INTRAVENOUS
  Filled 2022-06-12: qty 12

## 2022-06-12 MED ORDER — CALCIUM GLUCONATE-NACL 1-0.675 GM/50ML-% IV SOLN
1.0000 g | Freq: Once | INTRAVENOUS | Status: AC
  Administered 2022-06-12: 1000 mg via INTRAVENOUS
  Filled 2022-06-12: qty 50

## 2022-06-12 MED ORDER — OXYCODONE HCL 5 MG PO TABS
5.0000 mg | ORAL_TABLET | ORAL | Status: DC | PRN
  Administered 2022-06-12 – 2022-06-27 (×11): 5 mg
  Filled 2022-06-12 (×12): qty 1

## 2022-06-12 MED ORDER — QUETIAPINE FUMARATE 25 MG PO TABS: 50.0000 mg | ORAL_TABLET | Freq: Every day | ORAL | Status: DC

## 2022-06-12 MED ORDER — QUETIAPINE FUMARATE 25 MG PO TABS
50.0000 mg | ORAL_TABLET | Freq: Every day | ORAL | Status: DC
  Administered 2022-06-12: 50 mg
  Filled 2022-06-12: qty 2

## 2022-06-12 NOTE — Progress Notes (Signed)
[facility]  ROUNDING NOTE   Subjective:   Patient seen and evaluated at bedside in ICU Very somnolent today.  Abdomen is distended Currently on room air NG tube in place.  Tube feeds at 40 cc/h Currently has Foley in place with blood-tinged urine  Objective:  Vital signs in last 24 hours:  Temp:  [98.3 F (36.8 C)-99.4 F (37.4 C)] 98.3 F (36.8 C) (06/17 0800) Pulse Rate:  [61-132] 88 (06/17 1000) Resp:  [15-36] 18 (06/17 1000) BP: (100-161)/(64-103) 110/68 (06/17 1000) SpO2:  [91 %-99 %] 93 % (06/17 1000) Weight:  [102.7 kg] 102.7 kg (06/16 1530)  Weight change: 0.7 kg Filed Weights   06/08/22 1046 06/10/22 1610 06/11/22 1530  Weight: 96.6 kg 102 kg 102.7 kg    Intake/Output: I/O last 3 completed shifts: In: 4378.2 [I.V.:3367.7; Other:20; NG/GT:629.4; IV Piggyback:361.2] Out: 545 [Urine:545]   Intake/Output this shift:  Total I/O In: 568.8 [I.V.:35.9; NG/GT:420; IV Piggyback:112.9] Out: 40 [Urine:40]  Physical Exam: General: NAD, restless  Head: Normocephalic, atraumatic. Moist oral mucosal membranes  Eyes: Anicteric  Lungs:  Room air, bilateral crackles  Heart: Regular rate and rhythm  Abdomen:  Distended  Extremities: Some dependent peripheral edema.  Neurologic: Very somnolent today  Skin: No lesions  Access: Left IJ dialysis catheter 06/01/2022.  Xavier Esparza.    Basic Metabolic Panel: Recent Labs  Lab 06/10/22 0532 06/10/22 1529 06/10/22 1700 06/11/22 0419 06/11/22 1147 06/12/22 0449  NA 137 137  --  137 137 136  K 5.1 4.8  --  4.3 4.2 4.2  CL 106 102  --  101 99 100  CO2 21* 23  --  '26 25 25  '$ GLUCOSE 126* 131*  --  138* 131* 156*  BUN 50* 67*  --  78* 85* 80*  CREATININE 3.95* 5.29*  --  6.32* 6.99* 6.76*  CALCIUM 5.7* 5.6*  --  4.3* <4.0* 4.0*  MG  --   --   --   --   --  1.8  PHOS  --   --  3.4  --   --  4.0     Liver Function Tests: Recent Labs  Lab 06/08/22 1810 06/09/22 0415 06/10/22 0532 06/11/22 0419  06/12/22 0449  AST 1,756* 1,895* 593* 262* 162*  ALT 530* 621* 374* 218* 148*  ALKPHOS 54 57 44 41 51  BILITOT 1.9* 1.4* 1.7* 1.5* 0.9  PROT 6.8 6.1* 5.6* 5.4* 5.4*  ALBUMIN 3.6 3.4* 2.8* 2.7* 2.4*    Recent Labs  Lab 06/08/22 1810 06/09/22 0415 06/10/22 0532 06/12/22 0449  LIPASE 1,633* 1,741* 688* 102*    Recent Labs  Lab 06/10/22 1900  AMMONIA 13     CBC: Recent Labs  Lab 06/08/22 1057 06/09/22 0415 06/10/22 0532 06/10/22 1529 06/11/22 0419 06/12/22 0449  WBC 19.8* 11.4* 12.6* 14.2* 13.3* 13.8*  NEUTROABS 15.6* 9.7*  --   --  11.2* 11.5*  HGB 15.9 14.3 15.4 14.3 12.9* 11.2*  HCT 50.2 43.9 48.0 44.1 38.8* 33.6*  MCV 85.5 84.1 83.8 84.0 82.4 81.2  PLT 222 157 154 167 157 159     Cardiac Enzymes: Recent Labs  Lab 06/10/22 1618 06/11/22 0419  CKTOTAL 1,611* 1,740*     BNP: Invalid input(s): "POCBNP"  CBG: Recent Labs  Lab 06/11/22 1548 06/11/22 1934 06/11/22 2349 06/12/22 0346 06/12/22 0730  GLUCAP 157* 147* 147* 155* 202*     Microbiology: Results for orders placed or performed during the hospital encounter of 06/08/22  Blood  culture (routine x 2)     Status: None (Preliminary result)   Collection Time: 06/08/22  1:33 PM   Specimen: BLOOD  Result Value Ref Range Status   Specimen Description BLOOD LEFT HAND  Final   Special Requests   Final    BOTTLES DRAWN AEROBIC AND ANAEROBIC Blood Culture results may not be optimal due to an inadequate volume of blood received in culture bottles   Culture   Final    NO GROWTH 4 DAYS Performed at [facility], [address] [place], [place] [address]    Report Status PENDING  Incomplete  Blood culture (routine x 2)     Status: None (Preliminary result)   Collection Time: 06/08/22  2:15 PM   Specimen: BLOOD  Result Value Ref Range Status   Specimen Description BLOOD BLOOD LEFT WRIST  Final   Special Requests   Final    BOTTLES DRAWN AEROBIC AND ANAEROBIC Blood Culture results may  not be optimal due to an inadequate volume of blood received in culture bottles   Culture   Final    NO GROWTH 4 DAYS Performed at [facility], [address] [place], [place] [address]    Report Status PENDING  Incomplete  MRSA Next Gen by PCR, Nasal     Status: None   Collection Time: 06/10/22  4:14 PM   Specimen: Nasal Mucosa; Nasal Swab  Result Value Ref Range Status   MRSA by PCR Next Gen NOT DETECTED NOT DETECTED Final    Comment: (NOTE) The GeneXpert MRSA Assay (FDA approved for NASAL specimens only), is one component of a comprehensive MRSA colonization surveillance program. It is not intended to diagnose MRSA infection nor to guide or monitor treatment for MRSA infections. Test performance is not FDA approved in patients less than 41 years old. Performed at [facility], [address] [place], [place] [address]   SARS Coronavirus 2 by RT PCR (hospital order, performed in [facility] hospital lab) *cepheid single result test* Anterior Nasal Swab     Status: None   Collection Time: 06/10/22  5:17 PM   Specimen: Anterior Nasal Swab  Result Value Ref Range Status   SARS Coronavirus 2 by RT PCR NEGATIVE NEGATIVE Final    Comment: (NOTE) SARS-CoV-2 target nucleic acids are NOT DETECTED.  The SARS-CoV-2 RNA is generally detectable in upper and lower respiratory specimens during the acute phase of infection. The lowest concentration of SARS-CoV-2 viral copies this assay can detect is 250 copies / mL. A negative result does not preclude SARS-CoV-2 infection and should not be used as the sole basis for treatment or other patient management decisions.  A negative result may occur with improper specimen collection / handling, submission of specimen other than nasopharyngeal swab, presence of viral mutation(s) within the areas targeted by this assay, and inadequate number of viral copies (<250 copies / mL). A negative result must be combined with  clinical observations, patient history, and epidemiological information.  Fact Sheet for Patients:   https://www.patel.info/  Fact Sheet for Healthcare Providers: https://hall.com/  This test is not yet approved or  cleared by the [place] FDA and has been authorized for detection and/or diagnosis of SARS-CoV-2 by FDA under an Emergency Use Authorization (EUA).  This EUA will remain in effect (meaning this test can be used) for the duration of the COVID-19 declaration under Section 564(b)(1) of the Act, 21 U.S.C. section 360bbb-3(b)(1), unless the authorization is terminated or revoked sooner.  Performed at [facility], [address] [place], [place] [address]     Coagulation Studies: Recent Labs    06/12/22 0449  LABPROT 17.7*  INR 1.5*     Urinalysis: No results for input(s): "COLORURINE", "LABSPEC", "PHURINE", "GLUCOSEU", "HGBUR", "BILIRUBINUR", "KETONESUR", "PROTEINUR", "UROBILINOGEN", "NITRITE", "LEUKOCYTESUR" in the last 72 hours.  Invalid input(s): "APPERANCEUR"     Imaging: DG Chest Port 1 View  Addendum Date: 06/11/2022   ADDENDUM REPORT: 06/11/2022 17:02 ADDENDUM: Impression #1 called to Dr. Milon Dikes of the ICU at 2:58 p.m. on 06/11/2022. Electronically Signed   By: Kellie Simmering D.O.   On: 06/11/2022 17:02   Result Date: 06/11/2022 CLINICAL DATA:  Provided history: Encounter for central line and NG tube placement. EXAM: PORTABLE CHEST 1 VIEW COMPARISON:  Prior chest radiographs 06/08/2022 and earlier. Same day CT abdomen/pelvis 06/11/2022. FINDINGS: Left IJ approach central venous catheter terminating in the expected location of the brachiocephalic/SVC confluence. An enteric tube is coiled at the level of the lower neck and terminates at the level of the distal esophagus. Shallow inspiration radiograph, accentuating the cardiac silhouette and limiting evaluation of heart size. Small bilateral pleural  effusions better appreciated on same day CT abdomen/pelvis. Mild bibasilar atelectasis. No evidence of pneumothorax. No acute bony abnormality identified. Degenerative changes of the left glenohumeral joint. Attempts are being made to reach the ordering provider at this time regarding impression #1. IMPRESSION: An enteric tube is coiled at the level of the lower neck and terminates at the level of the distal esophagus. Left IJ approach central venous catheter terminating in the expected location of the brachiocephalic/SVC confluence. Shallow inspiration radiograph with small bilateral pleural effusions and bibasilar atelectasis. Electronically Signed: By: Kellie Simmering D.O. On: 06/11/2022 14:34   DG Naso G Tube Plc W/Fl W/Rad  Result Date: 06/11/2022 CLINICAL DATA:  History of prostate cancer, sepsis, abdominal pain, pancreatitis EXAM: NASO G TUBE PLACEMENT WITH FL AND WITH RAD CONTRAST:  5 mL Omnipaque 300 FLUOROSCOPY: Radiation Exposure Index (if provided by the fluoroscopic device): 41.4 mGy Number of Acquired Spot Images: 0 COMPARISON:  None Available. FINDINGS: Dobbhoff tube was inserted through the right nostril and advanced into the stomach. Dobbhoff tube was foot the further advanced into the fourth portion of the duodenum. Stylet was removed and tube was secured to the skin surface of the nose. No immediate complications.  Incidentally noted small hiatal hernia. IMPRESSION: Successful placement of a Dobbhoff tube with the tip located distal to the pylorus in the fourth portion of the duodenum. Electronically Signed   By: Kathreen Devoid M.D.   On: 06/11/2022 15:20   CT ABDOMEN PELVIS WO CONTRAST  Result Date: 06/11/2022 CLINICAL DATA:  Sepsis, abdominal pain. History of prostate cancer. History of renal calculi. Syncope. Pancreatitis with focal pancreatic necrosis on 06/08/2022 MRI. 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 EXAM: CT ABDOMEN AND PELVIS WITHOUT  CONTRAST TECHNIQUE: Multidetector CT imaging of the abdomen and pelvis was performed following the standard protocol without IV contrast. RADIATION DOSE REDUCTION: This exam was performed according to the departmental dose-optimization program which includes automated exposure control, adjustment of the mA and/or kV according to patient size and/or use of iterative reconstruction technique. COMPARISON:  Multiple exams, including MRI 06/09/2022 and 06/08/2022 FINDINGS: Lower chest: Small but increased bilateral pleural effusions with passive atelectasis. Coronary artery atherosclerotic calcifications. Hepatobiliary: Heterogeneous density in the right hepatic lobe corresponding to the prior region of portal vein thrombosis and suspected  micro abscesses. Scattered hepatic fluid density lesions compatible with cysts throughout the rest of the liver. High density material in the gallbladder, possibly sludge. Probable sludge in the nondilated common bile duct. Pancreas: Substantial peripancreatic edema. No gas density is identified within the pancreatic parenchyma or previous regions of pancreatic necrosis to indicate super infection with gas-forming organism. Acute peripancreatic fluid collections appear increased and there is increased surrounding mesenteric edema and fluid tracking along the paracolic gutters and anterior margins the perirenal fascia. Spleen: Unremarkable Adrenals/Urinary Tract: Two nonobstructive right renal calculi are observed, the larger measuring 0.7 cm in the right kidney lower pole on image 68 series 5. Benign 4.4 cm left kidney lower pole cyst. There is substantial contrast medium in the renal parenchyma bilaterally on today's noncontrast examination, indicating persistent nephrograms compatible with acute renal failure. This is likely left over from prior contrast enhanced studies from 06/08/2022 and possibly 06/09/2022. Correlate with urine production. Stomach/Bowel: Small type 1 hiatal  hernia. Indistinct descending and transverse duodenum likely from secondary inflammation. Scattered colonic diverticula. Descending and sigmoid colon diverticulosis. Vascular/Lymphatic: Atherosclerosis is present, including aortoiliac atherosclerotic disease. Reproductive: Brachytherapy seed implants in the prostate gland. Other: Increased ascites likely related to the pancreatitis. There is also increasing presacral edema. Musculoskeletal: Lumbar spondylosis and degenerative disc disease causing multilevel impingement. IMPRESSION: 1. Increased ascites and acute peripancreatic fluid collections. No gas is identified within the pancreatic parenchyma to suggest superinfection of the prior pancreatic necrosis with gas-forming organism at this time. Today's exam is performed without IV contrast and accordingly assessment for further pancreatic necrosis is not feasible. 2. Delayed nephrogram with contrast in the kidneys left over from the studies from 2-3 days ago. Appearance compatible with acute renal failure. 3. Small but increased bilateral pleural effusions with passive atelectasis. 4. 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 5. Two nonobstructive right renal calculi are observed. 6. Other imaging findings of potential clinical significance: Aortic Atherosclerosis (ICD10-I70.0). Brachytherapy seed implants in the prostate gland. Multilevel lumbar impingement. Small type 1 hiatal hernia. Descending and sigmoid colon diverticulosis. Secondary inflammation of the descending and transverse duodenum. Electronically Signed   By: Van Clines M.D.   On: 06/11/2022 09:34   US RENAL  Result Date: 06/10/2022 CLINICAL DATA:  Acute kidney injury EXAM: RENAL / URINARY TRACT ULTRASOUND COMPLETE COMPARISON:  No prior ultrasound, correlation is made with CT abdomen pelvis and MRI abdomen  06/08/2022 FINDINGS: Right Kidney: Renal measurements: 11.6 x 5.6 x 5.2 cm = volume: 172 mL. Echogenicity within normal limits. No mass or hydronephrosis visualized. Left Kidney: Renal measurements: 12.9 x 5.8 x 5.1 cm = volume: 201 mL. Echogenicity within normal limits. No hydronephrosis visualized. Cystic lesion in the mid left kidney measures up to 4.4 x 4.7 x 5.1 cm, consistent with a simple cyst, as seen on the 06/08/2022 CT and MRI Bladder: Appears normal for degree of bladder distention. Other: Small amount of ascites. IMPRESSION: 1. No acute finding in the kidneys. 2. Small volume ascites. Electronically Signed   By: Merilyn Baba M.D.   On: 06/10/2022 12:21     Medications:    feeding supplement (OSMOLITE 1.5 CAL) 65 mL/hr at 06/12/22 1000   lactated ringers Stopped (06/12/22 0435)   piperacillin-tazobactam (ZOSYN)  IV Stopped (06/12/22 4097)    Chlorhexidine Gluconate Cloth  6 each Topical Q0600   feeding supplement (PROSource TF)  45 mL Per Tube BID  free water  30 mL Per Tube Q4H   insulin aspart  0-15 Units Subcutaneous Q4H   midazolam  1 mg Intravenous Once   multivitamin  1 tablet Per Tube QHS   QUEtiapine  50 mg Per Tube QHS   acetaminophen **OR** acetaminophen, alteplase, heparin, HYDROmorphone (DILAUDID) injection, lidocaine (PF), lidocaine-prilocaine, metoCLOPramide (REGLAN) injection, oxyCODONE, pentafluoroprop-tetrafluoroeth, prochlorperazine  Assessment/ Plan:  Xavier Esparza is a 76 y.o.  male with past medical history consistent of prostate cancer, diabetes, recurrent kidney stones with lithotripsy and BPH, who was admitted to [facility] on 06/08/2022 for Portal vein thrombosis [I81] Lactic acidosis [E87.20] Necrotizing pancreatitis [K85.91] Liver mass [R16.0] Syncope, unspecified syncope type [R55] Leukocytosis, unspecified type [D72.829] Acute pancreatitis, unspecified complication status, unspecified pancreatitis type [K85.90]   Acute kidney injury with hematuria  likely secondary to severe illness and IV contrast exposure.  Normal kidney function prior to admission.  IV contrast exposure on 06/08/2022 and 06/09/2022.  Renal ultrasound negative for obstruction.  Urinalysis shows hematuria and mild proteinuria.     Patient remains oliguric, urine output of 545 cc from yesterday.  Received first dialysis treatment on June 16.  Tolerated well.  BUN/creatinine remain elevated. -Plan for hemodialysis today for metabolic optimization. -Given furosemide 120 mg IV x1 by primary team.  Lab Results  Component Value Date   CREATININE 6.76 (H) 06/12/2022   CREATININE 6.99 (H) 06/11/2022   CREATININE 6.32 (H) 06/11/2022    Intake/Output Summary (Last 24 hours) at 06/12/2022 1109 Last data filed at 06/12/2022 1000 Gross per 24 hour  Intake 3055.28 ml  Output 585 ml  Net 2470.28 ml    Acute pancreatitis, severe necrotizing.  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  Currently n.p.o.  Supportive care with antiemetics and pain control.   Currently getting IV Zosyn.  Dosed by pharmacy.   Diabetes mellitus type 2 without complication.Home regimen includes metformin.  Hemoglobin A1c 6.2 on 01/08/2022.  -Agree with holding metformin.    LOS: 4 Christna Kulick 6/17/202311:09 AM

## 2022-06-12 NOTE — Progress Notes (Signed)
NAME:  Xavier Esparza, MRN:  [id number], DOB:  [birth year], LOS: 4 ADMISSION DATE:  06/08/2022, CONSULTATION DATE: 06/11/22 REFERRING MD: Dr. Priscella Mann, CHIEF COMPLAINT: Loss of Consciousness    History of Present Illness:  This is a 76 yo male who presented to [facility] ER on 06/13 from home via EMS following a syncopal episode.  The pt does have a recent hx of hematuria and was evaluated by Urologist Dr. Yves Dill in the outpatient setting on 06/12.  During outpatient visit labs were obtained and pt prescribed levaquin in preparation for a cystoscopy.  He took a total of 2 doses, and on the morning of 06/13 the pts wife heard a loud thud, and found the pt on the bathroom floor.  She reported when she arrived in the bathroom the pt was awake, but unable to get himself off the floor prompting EMS notification.  Upon EMS arrival pt hypotensive bp 76/45, he received IM epinephrine and 400 ml IV fluid bolus  ED Course Upon arrival to the ER pt c/o abdominal pain with associated vomiting.  CT Abd Pelvis w contrast concerned moderate acute pancreatitis, no evidence of pancreatic necrosis or pseudocyst; thrombosis of the anterior right portal vein possible neoplasm or abscess.  CXR revealed atelectasis.  ED provider consulted GI physician Dr. Vicente Males who recommended MRI Abd and determined pt would not need ERCP due to high location of hepatic lesion.  Pt received iv zosyn and heparin gtt initiated.  Pt admitted to the stepdown unit per hospitalist team for additional workup and treatment.  PCCM team consulted 06/15 to assist with treatment.    Pertinent  Medical History  Type II Diabetes Mellitus Dyslipidemia  Erectile Dysfunction Herpes Labialis HLD Hyperlipidemia  Prostate Cancer Renal Stones Thyroid Nodule   Significant Hospital Events: Including procedures, antibiotic start and stop dates in addition to other pertinent events   6/14: admitted for severe sepsis and pancreatitis with lactic acidosis 6/15: PCCM  consulted for worsening pancreatitis acidosis renal failure and closer monitoring 6/16: Pt with worsening acute kidney injury nephrology consulted recommended hemodialysis  6/16: Started on dialysis 6/17: Will get dialysis today still altered but improved compared to yesterday  Interim History / Subjective:  Back with hematuria despite stopping the heparin  Objective   Blood pressure (!) 135/94, pulse 98, temperature 98.3 F (36.8 C), temperature source Oral, resp. rate (!) 31, height 6' (1.829 m), weight 102.7 kg, SpO2 95 %.        Intake/Output Summary (Last 24 hours) at 06/12/2022 0948 Last data filed at 06/12/2022 0800 Gross per 24 hour  Intake 2882.35 ml  Output 545 ml  Net 2337.35 ml    Filed Weights   06/08/22 1046 06/10/22 1610 06/11/22 1530  Weight: 96.6 kg 102 kg 102.7 kg    Examination: General: Acutely ill appearing male, 10/10 back pain  HENT: Supple, no JVD  Lungs: Faint crackles throughout, even, non labored  Cardiovascular: Sinus tachycardia, no r/g, 2+ radial/2+ distal pulses, no edema  Abdomen: Faint BS x4, distended, taut, tenderness present  Extremities: Normal bulk and tone, moves all extremities  Neuro: Disoriented to situation at times, follows commands, PERRLA GU: Indwelling foley catheter draining dark yellow urine   Resolved Hospital Problem list     Assessment & Plan:  Severe necrotizing pancreatitis  Portal vein thrombosis~CT Abd Pelvis 06/13 concerning for anterior right portal vein possible neoplasm or abscess CT from June 16 fluid collection no signs of infection Transaminitis  - Trend WBC and monitor  fever curve  - Follow cultures  - Continue zosyn per ID recommendations  - General surgery and GI consulted appreciate input  - Keep NPO for now defer to GI and General Surgery  - Trend hepatic function panel lipase and liver enzymes are improving - Hold heparin for hematuria  Syncopal episode possibly due to vasovagal  episode/hypotension - Continuous telemetry monitoring  -No IV fluids for volume overload continue with dialysis  Acute kidney injury with lactic and metabolic acidosis Rhabdomyolysis   Hypocalcemia  Started on dialysis is anuric  Type II diabetes mellitus  - CBG's q4hrs - SSI   Acute pain  - Prn dilaudid for pain management   Best Practice (right click and "Reselect all SmartList Selections" daily)   Diet/type: NPO DVT prophylaxis: LMWH GI prophylaxis: N/A Lines: N/A Foley:  Yes, and it is still needed Code Status:  full code Last date of multidisciplinary goals of care discussion [06/11/2022]   Labs   CBC: Recent Labs  Lab 06/08/22 1057 06/09/22 0415 06/10/22 0532 06/10/22 1529 06/11/22 0419 06/12/22 0449  WBC 19.8* 11.4* 12.6* 14.2* 13.3* 13.8*  NEUTROABS 15.6* 9.7*  --   --  11.2* 11.5*  HGB 15.9 14.3 15.4 14.3 12.9* 11.2*  HCT 50.2 43.9 48.0 44.1 38.8* 33.6*  MCV 85.5 84.1 83.8 84.0 82.4 81.2  PLT 222 157 154 167 157 159     Basic Metabolic Panel: Recent Labs  Lab 06/10/22 0532 06/10/22 1529 06/10/22 1700 06/11/22 0419 06/11/22 1147 06/12/22 0449  NA 137 137  --  137 137 136  K 5.1 4.8  --  4.3 4.2 4.2  CL 106 102  --  101 99 100  CO2 21* 23  --  '26 25 25  '$ GLUCOSE 126* 131*  --  138* 131* 156*  BUN 50* 67*  --  78* 85* 80*  CREATININE 3.95* 5.29*  --  6.32* 6.99* 6.76*  CALCIUM 5.7* 5.6*  --  4.3* <4.0* 4.0*  MG  --   --   --   --   --  1.8  PHOS  --   --  3.4  --   --  4.0    GFR: Estimated Creatinine Clearance: 11.3 mL/min (A) (by C-G formula based on SCr of 6.76 mg/dL (H)). Recent Labs  Lab 06/09/22 0415 06/10/22 0532 06/10/22 0938 06/10/22 1529 06/10/22 1813 06/11/22 0419 06/11/22 1544 06/12/22 0449  PROCALCITON 6.81 19.36  --   --   --   --   --   --   WBC 11.4* 12.6*  --  14.2*  --  13.3*  --  13.8*  LATICACIDVEN  --  2.9* 3.5* 4.0* 2.4*  --  1.7  --      Liver Function Tests: Recent Labs  Lab 06/08/22 1810  06/09/22 0415 06/10/22 0532 06/11/22 0419 06/12/22 0449  AST 1,756* 1,895* 593* 262* 162*  ALT 530* 621* 374* 218* 148*  ALKPHOS 54 57 44 41 51  BILITOT 1.9* 1.4* 1.7* 1.5* 0.9  PROT 6.8 6.1* 5.6* 5.4* 5.4*  ALBUMIN 3.6 3.4* 2.8* 2.7* 2.4*    Recent Labs  Lab 06/08/22 1810 06/09/22 0415 06/10/22 0532 06/12/22 0449  LIPASE 1,633* 1,741* 688* 102*    Recent Labs  Lab 06/10/22 1900  AMMONIA 13     ABG    Component Value Date/Time   PHART 7.41 06/10/2022 1530   PCO2ART 31 (L) 06/10/2022 1530   PO2ART 65 (L) 06/10/2022 1530   HCO3 19.6 (L) 06/10/2022  1530   ACIDBASEDEF 4.0 (H) 06/10/2022 1530   O2SAT 94.9 06/10/2022 1530     Coagulation Profile: Recent Labs  Lab 06/08/22 2028 06/12/22 0449  INR 1.1 1.5*     Cardiac Enzymes: Recent Labs  Lab 06/10/22 1618 06/11/22 0419  CKTOTAL 1,611* 1,740*     HbA1C: Hgb A1c MFr Bld  Date/Time Value Ref Range Status  01/08/2022 10:34 AM 6.2 4.6 - 6.5 % Final    Comment:    Glycemic Control Guidelines for People with Diabetes:Non Diabetic:  <6%Goal of Therapy: <7%Additional Action Suggested:  >8%   07/02/2021 08:46 AM 6.0 4.6 - 6.5 % Final    Comment:    Glycemic Control Guidelines for People with Diabetes:Non Diabetic:  <6%Goal of Therapy: <7%Additional Action Suggested:  >8%     CBG: Recent Labs  Lab 06/11/22 1548 06/11/22 1934 06/11/22 2349 06/12/22 0346 06/12/22 0730  GLUCAP 157* 147* 147* 155* 202*     Review of Systems: Positives in BOLD  Gen: Awake and disoriented HEENT: Denies blurred vision, double vision, hearing loss, tinnitus, sinus congestion, rhinorrhea, sore throat, neck stiffness, dysphagia PULM: Denies shortness of breath, cough, sputum production, hemoptysis, wheezing CV: Denies chest pain, edema, orthopnea, paroxysmal nocturnal dyspnea, palpitations GI: back/abdominal pain, nausea, vomiting, diarrhea, hematochezia, melena, constipation, change in bowel habits GU: Denies dysuria,  hematuria, polyuria, oliguria, urethral discharge Endocrine: Denies hot or cold intolerance, polyuria, polyphagia or appetite change Derm: Denies rash, dry skin, scaling or peeling skin change Heme: Denies easy bruising, bleeding, bleeding gums Neuro: Denies headache, numbness, weakness, slurred speech, loss of memory or consciousness  Past Medical History:  He,  has a past medical history of Adopted, Cataract, Diabetes mellitus, Dyslipidemia, Erectile dysfunction, Herpes labialis, Hyperlipidemia, Male hypogonadism, Prostate cancer ([place]), Renal stones, and Thyroid nodule.   Surgical History:   Past Surgical History:  Procedure Laterality Date   CATARACT EXTRACTION, BILATERAL  2017   Dr. Katy Fitch   COLONOSCOPY WITH PROPOFOL N/A 03/09/2018   Procedure: COLONOSCOPY WITH PROPOFOL;  Surgeon: Lin Landsman, MD;  Location: [facility] ENDOSCOPY;  Service: Gastroenterology;  Laterality: N/A;   COLONOSCOPY WITH PROPOFOL N/A 03/12/2021   Procedure: COLONOSCOPY WITH PROPOFOL;  Surgeon: Lin Landsman, MD;  Location: [place];  Service: Endoscopy;  Laterality: N/A;  priority 4   CYSTOSCOPY W/ URETERAL STENT PLACEMENT  08/09/2017   Procedure: CYSTOSCOPY WITH RETROGRADE PYELOGRAM/URETERAL STENT PLACEMENT;  Surgeon: Royston Cowper, MD;  Location: [facility] OR[facility];  Service: Urology;;   EXTRACORPOREAL SHOCK WAVE LITHOTRIPSY Left 09/15/2017   Procedure: EXTRACORPOREAL SHOCK WAVE LITHOTRIPSY (ESWL);  Surgeon: Royston Cowper, MD;  Location: [facility] OR[facility];  Service: Urology;  Laterality: Left;   EYE SURGERY Bilateral 2016   cataract extractions   GREEN LIGHT LASER TURP (TRANSURETHRAL RESECTION OF PROSTATE N/A 04/20/2016   Procedure: GREEN LIGHT LASER TURP (TRANSURETHRAL RESECTION OF PROSTATE;  Surgeon: Royston Cowper, MD;  Location: [facility];  Service: Urology;  Laterality: N/A;   INGUINAL HERNIA REPAIR  01/11/2012   Procedure: HERNIA REPAIR INGUINAL ADULT;  Surgeon: Pedro Earls, MD;  Location: [place];  Service: General;  Laterality: Right;  Open right inguinal hernia repair with mesh   POLYPECTOMY  03/12/2021   Procedure: POLYPECTOMY;  Surgeon: Lin Landsman, MD;  Location: [place];  Service: Endoscopy;;   RADIOACTIVE SEED IMPLANT     PROSTATE   URETEROSCOPY WITH HOLMIUM LASER LITHOTRIPSY Right 08/09/2017   Procedure: URETEROSCOPY WITH HOLMIUM LASER LITHOTRIPSY;  Surgeon: Maryan Puls  R, MD;  Location: [facility];  Service: Urology;  Laterality: Right;     Social History:   reports that he quit smoking about 31 years ago. His smoking use included cigarettes. He has never used smokeless tobacco. He reports current alcohol use. He reports that he does not use drugs.   Family History:  His family history is not on file. He was adopted.   Allergies No Known Allergies   Home Medications  Prior to Admission medications   Medication Sig Start Date End Date Taking? Authorizing Provider  acyclovir (ZOVIRAX) 800 MG tablet TAKE 1 TABLET DAILY 01/25/22  Yes Tonia Ghent, MD  aspirin EC 81 MG tablet Take 81 mg by mouth daily. Swallow whole.   Yes [provider]  Calcium Carbonate (CALCIUM 600 PO) Take 2 tablets by mouth daily.   Yes [provider]  GLUCOSAMINE HCL PO Take 1 tablet by mouth daily.    Yes [provider]  losartan (COZAAR) 25 MG tablet Take 1 tablet (25 mg total) by mouth daily. 12/29/21  Yes Tower, Wynelle Fanny, MD  Magnesium 500 MG TABS Take 1 tablet by mouth daily.   Yes [provider]  metFORMIN (GLUCOPHAGE) 500 MG tablet TAKE 1 TABLET DAILY 01/25/22  Yes Tonia Ghent, MD  Multiple Vitamin (MULTIVITAMIN) tablet Take 1 tablet by mouth daily. Reported on 01/07/2016   Yes [provider]  Omega-3 Fatty Acids ([employer] FISH OIL PO) Take 1,000 mg by mouth daily.   Yes [provider]  Red Yeast Rice 600 MG TABS Take 2 tablets (1,200 mg total) by mouth daily. 01/08/22  Yes Tonia Ghent, MD   rosuvastatin (CRESTOR) 20 MG tablet TAKE 1 TABLET EVERY OTHER  DAY IN THE MORNING. NEEDS TO CALL OFFICE TO SCHEDULE YEARLY EXAM 04/12/22  Yes Tonia Ghent, MD  Testosterone 1.62 % GEL Apply 2 Pump topically daily. 05/12/22  Yes [provider]

## 2022-06-12 NOTE — Progress Notes (Signed)
Pt c/o sob. Pt is tachypneic. Sat 94% on RA. Rales and wheezing noted on auscultation. Notified Elizabeth NP. Informed her of pt fluid rates currently and that pt has minimal  uop. Ordered to stop LR. Read back and verified.

## 2022-06-12 NOTE — Progress Notes (Signed)
\ PROGRESS NOTE    Xavier Esparza  OIB:[id number] DOB: 10-06-1946 DOA: 06/08/2022 PCP: Tonia Ghent, MD    Brief Narrative:  76 y.o. male with medical history significant for DM, BPH, history of prostate cancer, kidney stones status post lithotripsy, negative colonoscopy 2022 (3 benign polyps), who presents to the ED by EMS following a syncopal episode at home while walking to the bathroom.  Wife said she heard a loud thud and went to find him on the floor.  She said he was awake when she got there but he could not help himself up and she called 911.  Patient states he was in his usual state of health but about 2 weeks prior he saw a little blood in his urine similar to when he had his kidney stone.  He saw his urologist yesterday who gave him a prescription for Levaquin.  He otherwise denied feeling unwell previous to the episode.  He denied chest pain, shortness of breath, headache or visual disturbance, one-sided numbness weakness or tingling, nausea or vomiting, abdominal pain, urinary frequency or burning or diarrhea.[facility] reported BP 76/45 and he was treated with IM epinephrine and a 400 mL fluid bolus.  He was awake and alert x4 by arrival.  After his arrival in the emergency room he developed abdominal pain and vomiting.   Initial CT abdomen pelvis with moderate acute pancreatitis no evidence of pancreatic necrosis or pseudocyst.  Follow-up MRI abdomen concerning for possible choledocholithiasis with evidence of necrotizing pancreatitis.  Case discussed with GI Dr. Marius Ditch at [facility].   There was mention of possible portal vein thrombosis.  Heparin GTT was initially started however patient developed suspected acute hematuria so heparin GTT was stopped.  VQ scan ordered and perfusion component will be completed prior to patient transfer.   6/15: Clinical status overall unchanged.  Patient remains with abdominal pain associated with nausea.  Hemodynamically stable.  Mild elevation in white count.   Lipase and LFTs have improved over interval.  Discussed case with GI and general surgery.  Likely that patient's presentation is secondary to necrotizing pancreatitis.  Currently as there is no CBD stone and there is no strong indication for transfer for ERCP.  From a surgical standpoint no intervention is recommended.  There is nothing from an interventional radiology standpoint to drain.  As such we will discontinue transfer and keep patient in [facility].  6/16: No appreciable change in clinical status.  LFTs are downtrending.  White count slightly downtrending.  No fevers noted over interval.  Increasing abdominal distention noted.  Ascites and peripancreatic fluid seen on repeat CAT scan today.  Discussed case with PCCM, nephrology, GI.  Will attempt postpyloric feedings.  Kidney function continues to deteriorate.  Nephrology will initiate hemodialysis today.  6/17: No change in clinical status.  White count remains minimally elevated.  No fevers over interval.  Remains with abdominal distention.  Started HD yesterday.  We will run another round of HD today.   Assessment & Plan:   Principal Problem:   Severe sepsis ([place]) Active Problems:   Acute pancreatitis   Liver mass, right lobe, abscess vs mass   Portal vein thrombosis   Syncope   AKI (acute kidney injury) ([place])   HTN (hypertension)   Diabetes mellitus without complication (H[facility])   BPH (benign prostatic hyperplasia)   Nephrolithiasis   History of prostate cancer   Necrotizing pancreatitis  Severe necrotizing pancreatitis Portal vein thrombosis Patient presented with intractable nausea and vomiting.  Ill-defined heterogeneous  mass in the liver.  MRI and MRCP revealed severe acute necrotizing pancreatitis with associated portal venous thrombosis.  No evidence of CBD stone.  Microabscess noted.  Plan: Complex management.  Will need involvement from multiple consult services.  Consultants engaged include general surgery, IR, nephrology,  ICU.  GI following and appreciate involvement in care.  IV fluids currently on hold.  Appreciate ICU assistance.  Continue tube feeds at 40 cc/h.  IV pain meds, IV antiemetics  We will attempt to resume heparin GTT today.  Likely multifactorial etiology for hematuria including history of prostate cancer.  ATN from pancreatitis, of uremic bleeding from dysfunctional platelets.  Will attempt to restart heparin GTT today after hemodialysis  At this point transfer to tertiary care is not a likely option.  I have attempted to contact [facility] and [facility] transfer centers.  Unfortunately both of these services are close to outside medicine transfers.  I have discussed this with the general surgery service who continues to feel that patient will be better served in a tertiary care facility.  If a surgical service at [facility] or tertiary care facility would be willing to accept the patient on their service for further management and close monitoring we can transfer otherwise we will continue to provide optimized care at our facility.   Acute kidney injury progressing Significant and in the setting of aggressive fluid resuscitation.  AKI likely secondary to ATN in the setting of severe acute pancreatitis Vas-Cath placed and initiated on HD 6/16 Plan: HD again today  Syncopal event Suspect secondary to hypotension versus vasovagal event PE ruled out, VQ scan low probability Continue telemetry monitoring  Hematuria History of nephrolithiasis History history of BPH History of prostate cancer No evidence of urinary retention despite worsening kidney function Continue Foley catheter  Diabetes mellitus without complication Sliding-scale coverage    DVT prophylaxis: Heparin GTT Code Status: Full Family Communication: Wife at bedside 6/15, 6/17 Disposition Plan: Status is: Inpatient Remains inpatient appropriate because: Severe acute necrotizing pancreatitis   Level of care: Stepdown  Consultants:   ICU GI General surgery ID Nephrology  Procedures:  None  Antimicrobials: Zosyn   Subjective: Patient seen and examined.  Lying in bed.  Appears uncomfortable  Objective: Vitals:   06/12/22 1300 06/12/22 1400 06/12/22 1405 06/12/22 1406  BP: 139/84 (!) 152/82    Pulse: 95 99  96  Resp: (!) 29 (!) 31  (!) 33  Temp:    98.7 F (37.1 C)  TempSrc:    Oral  SpO2: 94% 93%  94%  Weight:   105.9 kg   Height:        Intake/Output Summary (Last 24 hours) at 06/12/2022 1410 Last data filed at 06/12/2022 1000 Gross per 24 hour  Intake 2511.53 ml  Output 360 ml  Net 2151.53 ml   Filed Weights   06/10/22 1610 06/11/22 1530 06/12/22 1405  Weight: 102 kg 102.7 kg 105.9 kg    Examination:  General exam: Appears uncomfortable.  Somewhat lethargic Respiratory system: Clear to auscultation. Respiratory effort normal. Cardiovascular system: S1-S2, RRR, no murmurs, no pedal edema Gastrointestinal system: Distended, right lower quadrant tenderness Central nervous system: Alert and oriented. No focal neurological deficits. Extremities: Symmetric 5 x 5 power. Skin: No rashes, lesions or ulcers Psychiatry: Judgement and insight appear normal. Mood & affect appropriate.     Data Reviewed: I have personally reviewed following labs and imaging studies  CBC: Recent Labs  Lab 06/08/22 1057 06/09/22 0415 06/10/22 0532 06/10/22 1529  06/11/22 0419 06/12/22 0449  WBC 19.8* 11.4* 12.6* 14.2* 13.3* 13.8*  NEUTROABS 15.6* 9.7*  --   --  11.2* 11.5*  HGB 15.9 14.3 15.4 14.3 12.9* 11.2*  HCT 50.2 43.9 48.0 44.1 38.8* 33.6*  MCV 85.5 84.1 83.8 84.0 82.4 81.2  PLT 222 157 154 167 157 409   Basic Metabolic Panel: Recent Labs  Lab 06/10/22 0532 06/10/22 1529 06/10/22 1700 06/11/22 0419 06/11/22 1147 06/12/22 0449  NA 137 137  --  137 137 136  K 5.1 4.8  --  4.3 4.2 4.2  CL 106 102  --  101 99 100  CO2 21* 23  --  '26 25 25  '$ GLUCOSE 126* 131*  --  138* 131* 156*  BUN 50* 67*   --  78* 85* 80*  CREATININE 3.95* 5.29*  --  6.32* 6.99* 6.76*  CALCIUM 5.7* 5.6*  --  4.3* <4.0* 4.0*  MG  --   --   --   --   --  1.8  PHOS  --   --  3.4  --   --  4.0   GFR: Estimated Creatinine Clearance: 11.5 mL/min (A) (by C-G formula based on SCr of 6.76 mg/dL (H)). Liver Function Tests: Recent Labs  Lab 06/08/22 1810 06/09/22 0415 06/10/22 0532 06/11/22 0419 06/12/22 0449  AST 1,756* 1,895* 593* 262* 162*  ALT 530* 621* 374* 218* 148*  ALKPHOS 54 57 44 41 51  BILITOT 1.9* 1.4* 1.7* 1.5* 0.9  PROT 6.8 6.1* 5.6* 5.4* 5.4*  ALBUMIN 3.6 3.4* 2.8* 2.7* 2.4*   Recent Labs  Lab 06/08/22 1810 06/09/22 0415 06/10/22 0532 06/12/22 0449  LIPASE 1,633* 1,741* 688* 102*   Recent Labs  Lab 06/10/22 1900  AMMONIA 13   Coagulation Profile: Recent Labs  Lab 06/08/22 2028 06/12/22 0449  INR 1.1 1.5*   Cardiac Enzymes: Recent Labs  Lab 06/10/22 1618 06/11/22 0419  CKTOTAL 1,611* 1,740*   BNP (last 3 results) No results for input(s): "PROBNP" in the last 8760 hours. HbA1C: No results for input(s): "HGBA1C" in the last 72 hours. CBG: Recent Labs  Lab 06/11/22 1934 06/11/22 2349 06/12/22 0346 06/12/22 0730 06/12/22 1130  GLUCAP 147* 147* 155* 202* 171*   Lipid Profile: Recent Labs    06/10/22 1529  TRIG 135   Thyroid Function Tests: No results for input(s): "TSH", "T4TOTAL", "FREET4", "T3FREE", "THYROIDAB" in the last 72 hours. Anemia Panel: No results for input(s): "VITAMINB12", "FOLATE", "FERRITIN", "TIBC", "IRON", "RETICCTPCT" in the last 72 hours. Sepsis Labs: Recent Labs  Lab 06/09/22 0415 06/10/22 0532 06/10/22 0938 06/10/22 1529 06/10/22 1813 06/11/22 1544  PROCALCITON 6.81 19.36  --   --   --   --   LATICACIDVEN  --  2.9* 3.5* 4.0* 2.4* 1.7    Recent Results (from the past 240 hour(s))  Blood culture (routine x 2)     Status: None (Preliminary result)   Collection Time: 06/08/22  1:33 PM   Specimen: BLOOD  Result Value Ref Range  Status   Specimen Description BLOOD LEFT HAND  Final   Special Requests   Final    BOTTLES DRAWN AEROBIC AND ANAEROBIC Blood Culture results may not be optimal due to an inadequate volume of blood received in culture bottles   Culture   Final    NO GROWTH 4 DAYS Performed at [facility], [address], [place], [place] [address]    Report Status PENDING  Incomplete  Blood culture (routine x 2)  Status: None (Preliminary result)   Collection Time: 06/08/22  2:15 PM   Specimen: BLOOD  Result Value Ref Range Status   Specimen Description BLOOD BLOOD LEFT WRIST  Final   Special Requests   Final    BOTTLES DRAWN AEROBIC AND ANAEROBIC Blood Culture results may not be optimal due to an inadequate volume of blood received in culture bottles   Culture   Final    NO GROWTH 4 DAYS Performed at [facility], [address] [place], [place] [address]    Report Status PENDING  Incomplete  MRSA Next Gen by PCR, Nasal     Status: None   Collection Time: 06/10/22  4:14 PM   Specimen: Nasal Mucosa; Nasal Swab  Result Value Ref Range Status   MRSA by PCR Next Gen NOT DETECTED NOT DETECTED Final    Comment: (NOTE) The GeneXpert MRSA Assay (FDA approved for NASAL specimens only), is one component of a comprehensive MRSA colonization surveillance program. It is not intended to diagnose MRSA infection nor to guide or monitor treatment for MRSA infections. Test performance is not FDA approved in patients less than 82 years old. Performed at [facility], [address] [place], [place] [address]   SARS Coronavirus 2 by RT PCR (hospital order, performed in [facility] hospital lab) *cepheid single result test* Anterior Nasal Swab     Status: None   Collection Time: 06/10/22  5:17 PM   Specimen: Anterior Nasal Swab  Result Value Ref Range Status   SARS Coronavirus 2 by RT PCR NEGATIVE NEGATIVE Final    Comment: (NOTE) SARS-CoV-2 target nucleic acids are NOT  DETECTED.  The SARS-CoV-2 RNA is generally detectable in upper and lower respiratory specimens during the acute phase of infection. The lowest concentration of SARS-CoV-2 viral copies this assay can detect is 250 copies / mL. A negative result does not preclude SARS-CoV-2 infection and should not be used as the sole basis for treatment or other patient management decisions.  A negative result may occur with improper specimen collection / handling, submission of specimen other than nasopharyngeal swab, presence of viral mutation(s) within the areas targeted by this assay, and inadequate number of viral copies (<250 copies / mL). A negative result must be combined with clinical observations, patient history, and epidemiological information.  Fact Sheet for Patients:   https://www.patel.info/  Fact Sheet for Healthcare Providers: https://hall.com/  This test is not yet approved or  cleared by the [place] FDA and has been authorized for detection and/or diagnosis of SARS-CoV-2 by FDA under an Emergency Use Authorization (EUA).  This EUA will remain in effect (meaning this test can be used) for the duration of the COVID-19 declaration under Section 564(b)(1) of the Act, 21 U.S.C. section 360bbb-3(b)(1), unless the authorization is terminated or revoked sooner.  Performed at [facility], [address], [place], [place] [address]          Radiology Studies: [facility] Chest [place] 1 View  Addendum Date: 06/11/2022   ADDENDUM REPORT: 06/11/2022 17:02 ADDENDUM: Impression #1 called to Dr. Milon Dikes of the ICU at 2:58 p.m. on 06/11/2022. Electronically Signed   By: Kellie Simmering D.O.   On: 06/11/2022 17:02   Result Date: 06/11/2022 CLINICAL DATA:  Provided history: Encounter for central line and NG tube placement. EXAM: PORTABLE CHEST 1 VIEW COMPARISON:  Prior chest radiographs 06/08/2022 and earlier. Same day CT abdomen/pelvis 06/11/2022.  FINDINGS: Left IJ approach central venous catheter terminating in the expected location  of the brachiocephalic/SVC confluence. An enteric tube is coiled at the level of the lower neck and terminates at the level of the distal esophagus. Shallow inspiration radiograph, accentuating the cardiac silhouette and limiting evaluation of heart size. Small bilateral pleural effusions better appreciated on same day CT abdomen/pelvis. Mild bibasilar atelectasis. No evidence of pneumothorax. No acute bony abnormality identified. Degenerative changes of the left glenohumeral joint. Attempts are being made to reach the ordering provider at this time regarding impression #1. IMPRESSION: An enteric tube is coiled at the level of the lower neck and terminates at the level of the distal esophagus. Left IJ approach central venous catheter terminating in the expected location of the brachiocephalic/SVC confluence. Shallow inspiration radiograph with small bilateral pleural effusions and bibasilar atelectasis. Electronically Signed: By: Kellie Simmering D.O. On: 06/11/2022 14:34   DG Naso G Tube Plc W/Fl W/Rad  Result Date: 06/11/2022 CLINICAL DATA:  History of prostate cancer, sepsis, abdominal pain, pancreatitis EXAM: NASO G TUBE PLACEMENT WITH FL AND WITH RAD CONTRAST:  5 mL Omnipaque 300 FLUOROSCOPY: Radiation Exposure Index (if provided by the fluoroscopic device): 41.4 mGy Number of Acquired Spot Images: 0 COMPARISON:  None Available. FINDINGS: Dobbhoff tube was inserted through the right nostril and advanced into the stomach. Dobbhoff tube was foot the further advanced into the fourth portion of the duodenum. Stylet was removed and tube was secured to the skin surface of the nose. No immediate complications.  Incidentally noted small hiatal hernia. IMPRESSION: Successful placement of a Dobbhoff tube with the tip located distal to the pylorus in the fourth portion of the duodenum. Electronically Signed   By: Kathreen Devoid M.D.    On: 06/11/2022 15:20   CT ABDOMEN PELVIS WO CONTRAST  Result Date: 06/11/2022 CLINICAL DATA:  Sepsis, abdominal pain. History of prostate cancer. History of renal calculi. Syncope. Pancreatitis with focal pancreatic necrosis on 06/08/2022 MRI. 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 EXAM: CT ABDOMEN AND PELVIS WITHOUT CONTRAST TECHNIQUE: Multidetector CT imaging of the abdomen and pelvis was performed following the standard protocol without IV contrast. RADIATION DOSE REDUCTION: This exam was performed according to the departmental dose-optimization program which includes automated exposure control, adjustment of the mA and/or kV according to patient size and/or use of iterative reconstruction technique. COMPARISON:  Multiple exams, including MRI 06/09/2022 and 06/08/2022 FINDINGS: Lower chest: Small but increased bilateral pleural effusions with passive atelectasis. Coronary artery atherosclerotic calcifications. Hepatobiliary: Heterogeneous density in the right hepatic lobe corresponding to the prior region of portal vein thrombosis and suspected micro abscesses. Scattered hepatic fluid density lesions compatible with cysts throughout the rest of the liver. High density material in the gallbladder, possibly sludge. Probable sludge in the nondilated common bile duct. Pancreas: Substantial peripancreatic edema. No gas density is identified within the pancreatic parenchyma or previous regions of pancreatic necrosis to indicate super infection with gas-forming organism. Acute peripancreatic fluid collections appear increased and there is increased surrounding mesenteric edema and fluid tracking along the paracolic gutters and anterior margins the perirenal fascia. Spleen: Unremarkable Adrenals/Urinary Tract: Two nonobstructive right renal calculi are observed, the larger measuring 0.7 cm in the right kidney lower pole on image 68 series 5. Benign 4.4 cm left kidney  lower pole cyst. There is substantial contrast medium in the renal parenchyma bilaterally on today's noncontrast examination, indicating persistent nephrograms compatible with acute renal failure. This is likely left over from prior contrast enhanced studies from 06/08/2022 and possibly 06/09/2022. Correlate  with urine production. Stomach/Bowel: Small type 1 hiatal hernia. Indistinct descending and transverse duodenum likely from secondary inflammation. Scattered colonic diverticula. Descending and sigmoid colon diverticulosis. Vascular/Lymphatic: Atherosclerosis is present, including aortoiliac atherosclerotic disease. Reproductive: Brachytherapy seed implants in the prostate gland. Other: Increased ascites likely related to the pancreatitis. There is also increasing presacral edema. Musculoskeletal: Lumbar spondylosis and degenerative disc disease causing multilevel impingement. IMPRESSION: 1. Increased ascites and acute peripancreatic fluid collections. No gas is identified within the pancreatic parenchyma to suggest superinfection of the prior pancreatic necrosis with gas-forming organism at this time. Today's exam is performed without IV contrast and accordingly assessment for further pancreatic necrosis is not feasible. 2. Delayed nephrogram with contrast in the kidneys left over from the studies from 2-3 days ago. Appearance compatible with acute renal failure. 3. Small but increased bilateral pleural effusions with passive atelectasis. 4. 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 5. Two nonobstructive right renal calculi are observed. 6. Other imaging findings of potential clinical significance: Aortic Atherosclerosis (ICD10-I70.0). Brachytherapy seed implants in the prostate gland. Multilevel lumbar impingement. Small type 1 hiatal hernia. Descending and sigmoid colon  diverticulosis. Secondary inflammation of the descending and transverse duodenum. Electronically Signed   By: Van Clines M.D.   On: 06/11/2022 09:34        Scheduled Meds:  Chlorhexidine Gluconate Cloth  6 each Topical Q0600   feeding supplement (PROSource TF)  45 mL Per Tube BID   free water  30 mL Per Tube Q4H   insulin aspart  0-15 Units Subcutaneous Q4H   midazolam  1 mg Intravenous Once   multivitamin  1 tablet Per Tube QHS   QUEtiapine  50 mg Per Tube QHS   Continuous Infusions:  feeding supplement (OSMOLITE 1.5 CAL) 65 mL/hr at 06/12/22 1000   lactated ringers Stopped (06/12/22 0435)   piperacillin-tazobactam (ZOSYN)  IV Stopped (06/12/22 7482)     LOS: 4 days         Sidney Ace, MD [facility]   If 7PM-7AM, please contact night-coverage  06/12/2022, 2:10 PM

## 2022-06-13 DIAGNOSIS — K8502 Idiopathic acute pancreatitis with infected necrosis: Secondary | ICD-10-CM | POA: Diagnosis not present

## 2022-06-13 DIAGNOSIS — K8591 Acute pancreatitis with uninfected necrosis, unspecified: Secondary | ICD-10-CM | POA: Diagnosis not present

## 2022-06-13 DIAGNOSIS — Z7189 Other specified counseling: Secondary | ICD-10-CM

## 2022-06-13 DIAGNOSIS — A419 Sepsis, unspecified organism: Secondary | ICD-10-CM | POA: Diagnosis not present

## 2022-06-13 DIAGNOSIS — R55 Syncope and collapse: Secondary | ICD-10-CM | POA: Diagnosis not present

## 2022-06-13 DIAGNOSIS — K859 Acute pancreatitis without necrosis or infection, unspecified: Secondary | ICD-10-CM | POA: Diagnosis not present

## 2022-06-13 LAB — COMPREHENSIVE METABOLIC PANEL
ALT: 105 U/L — ABNORMAL HIGH (ref 0–44)
AST: 106 U/L — ABNORMAL HIGH (ref 15–41)
Albumin: 2.3 g/dL — ABNORMAL LOW (ref 3.5–5.0)
Alkaline Phosphatase: 62 U/L (ref 38–126)
Anion gap: 13 (ref 5–15)
BUN: 83 mg/dL — ABNORMAL HIGH (ref 8–23)
CO2: 24 mmol/L (ref 22–32)
Calcium: 4.2 mg/dL — CL (ref 8.9–10.3)
Chloride: 99 mmol/L (ref 98–111)
Creatinine, Ser: 7.54 mg/dL — ABNORMAL HIGH (ref 0.61–1.24)
GFR, Estimated: 7 mL/min — ABNORMAL LOW (ref >60)
Glucose, Bld: 182 mg/dL — ABNORMAL HIGH (ref 70–99)
Potassium: 4.3 mmol/L (ref 3.5–5.1)
Sodium: 136 mmol/L (ref 135–145)
Total Bilirubin: 0.7 mg/dL (ref 0.3–1.2)
Total Protein: 5.5 g/dL — ABNORMAL LOW (ref 6.5–8.1)

## 2022-06-13 LAB — GLUCOSE, CAPILLARY
Glucose-Capillary: 186 mg/dL — ABNORMAL HIGH (ref 70–99)
Glucose-Capillary: 154 mg/dL — ABNORMAL HIGH (ref 70–99)
Glucose-Capillary: 158 mg/dL — ABNORMAL HIGH (ref 70–99)
Glucose-Capillary: 199 mg/dL — ABNORMAL HIGH (ref 70–99)
Glucose-Capillary: 170 mg/dL — ABNORMAL HIGH (ref 70–99)

## 2022-06-13 LAB — CULTURE, BLOOD (ROUTINE X 2)
Culture: NO GROWTH
Culture: NO GROWTH

## 2022-06-13 LAB — CBC WITH DIFFERENTIAL/PLATELET
Abs Immature Granulocytes: 0.35 10*3/uL — ABNORMAL HIGH (ref 0.00–0.07)
Basophils Absolute: 0.1 10*3/uL (ref 0.0–0.1)
Basophils Relative: 0 %
Eosinophils Absolute: 0 10*3/uL (ref 0.0–0.5)
Eosinophils Relative: 0 %
HCT: 31.6 % — ABNORMAL LOW (ref 39.0–52.0)
Hemoglobin: 10.6 g/dL — ABNORMAL LOW (ref 13.0–17.0)
Immature Granulocytes: 3 %
Lymphocytes Relative: 7 %
Lymphs Abs: 0.9 10*3/uL (ref 0.7–4.0)
MCH: 27.5 pg (ref 26.0–34.0)
MCHC: 33.5 g/dL (ref 30.0–36.0)
MCV: 81.9 fL (ref 80.0–100.0)
Monocytes Absolute: 1.3 10*3/uL — ABNORMAL HIGH (ref 0.1–1.0)
Monocytes Relative: 10 %
Neutro Abs: 10.1 10*3/uL — ABNORMAL HIGH (ref 1.7–7.7)
Neutrophils Relative %: 80 %
Platelets: 146 10*3/uL — ABNORMAL LOW (ref 150–400)
RBC: 3.86 MIL/uL — ABNORMAL LOW (ref 4.22–5.81)
RDW: 16.7 % — ABNORMAL HIGH (ref 11.5–15.5)
WBC: 12.7 10*3/uL — ABNORMAL HIGH (ref 4.0–10.5)
nRBC: 0.5 % — ABNORMAL HIGH (ref 0.0–0.2)

## 2022-06-13 LAB — PHOSPHORUS: Phosphorus: 5 mg/dL — ABNORMAL HIGH (ref 2.5–4.6)

## 2022-06-13 LAB — MAGNESIUM: Magnesium: 2.1 mg/dL (ref 1.7–2.4)

## 2022-06-13 MED ORDER — ORAL CARE MOUTH RINSE
15.0000 mL | OROMUCOSAL | Status: DC
Start: 2022-06-13 — End: 2022-06-13
  Administered 2022-06-13: 15 mL via OROMUCOSAL

## 2022-06-13 MED ORDER — ORAL CARE MOUTH RINSE: 15.0000 mL | OROMUCOSAL | Status: DC | PRN

## 2022-06-13 MED ORDER — POLYETHYLENE GLYCOL 3350 17 G PO PACK
17.0000 g | PACK | Freq: Every day | ORAL | Status: DC | PRN
Start: 2022-06-13 — End: 2022-06-19
  Administered 2022-06-13: 17 g
  Filled 2022-06-13: qty 1

## 2022-06-13 MED ORDER — CALCIUM GLUCONATE-NACL 2-0.675 GM/100ML-% IV SOLN
2.0000 g | Freq: Once | INTRAVENOUS | Status: AC
Start: 2022-06-13 — End: 2022-06-13
  Administered 2022-06-13: 2000 mg via INTRAVENOUS
  Filled 2022-06-13: qty 100

## 2022-06-13 MED ORDER — SODIUM CHLORIDE 0.9 % IV SOLN: INTRAVENOUS | Status: DC | PRN

## 2022-06-13 MED ORDER — QUETIAPINE FUMARATE 25 MG PO TABS
100.0000 mg | ORAL_TABLET | Freq: Every day | ORAL | Status: DC
Start: 2022-06-13 — End: 2022-06-18
  Administered 2022-06-13: 100 mg
  Filled 2022-06-13 (×2): qty 4

## 2022-06-13 MED ORDER — ALBUMIN HUMAN 25 % IV SOLN
25.0000 g | Freq: Four times a day (QID) | INTRAVENOUS | Status: DC
  Administered 2022-06-13 – 2022-06-14 (×2): 25 g via INTRAVENOUS
  Administered 2022-06-14: 16 g via INTRAVENOUS
  Filled 2022-06-13 (×3): qty 100

## 2022-06-13 MED ORDER — CHLORHEXIDINE GLUCONATE 0.12 % MT SOLN
15.0000 mL | Freq: Once | OROMUCOSAL | Status: AC
  Administered 2022-06-13: 15 mL via OROMUCOSAL
  Filled 2022-06-13: qty 15

## 2022-06-13 MED ORDER — HYDROXYZINE HCL 10 MG PO TABS
10.0000 mg | ORAL_TABLET | Freq: Three times a day (TID) | ORAL | Status: DC | PRN
  Administered 2022-06-13 – 2022-06-26 (×9): 10 mg
  Filled 2022-06-13 (×11): qty 1

## 2022-06-13 NOTE — Progress Notes (Signed)
Cross Cover Corrected calcium 5.56. Phos remains above 5. 2 gm Calcium gluconate IV ordered

## 2022-06-13 NOTE — Progress Notes (Signed)
NAME:  Xavier Esparza, MRN:  [id number], DOB:  07-28-1946, LOS: 5 ADMISSION DATE:  06/08/2022, CONSULTATION DATE: 06/11/22 REFERRING MD: Dr. Priscella Mann, CHIEF COMPLAINT: Loss of Consciousness    History of Present Illness:  This is a 76 yo male who presented to [facility] ER on 06/13 from home via EMS following a syncopal episode.  The pt does have a recent hx of hematuria and was evaluated by Urologist Dr. Yves Dill in the outpatient setting on 06/12.  During outpatient visit labs were obtained and pt prescribed levaquin in preparation for a cystoscopy.  He took a total of 2 doses, and on the morning of 06/13 the pts wife heard a loud thud, and found the pt on the bathroom floor.  She reported when she arrived in the bathroom the pt was awake, but unable to get himself off the floor prompting EMS notification.  Upon EMS arrival pt hypotensive bp 76/45, he received IM epinephrine and 400 ml IV fluid bolus  ED Course Upon arrival to the ER pt c/o abdominal pain with associated vomiting.  CT Abd Pelvis w contrast concerned moderate acute pancreatitis, no evidence of pancreatic necrosis or pseudocyst; thrombosis of the anterior right portal vein possible neoplasm or abscess.  CXR revealed atelectasis.  ED provider consulted GI physician Dr. Vicente Males who recommended MRI Abd and determined pt would not need ERCP due to high location of hepatic lesion.  Pt received iv zosyn and heparin gtt initiated.  Pt admitted to the stepdown unit per hospitalist team for additional workup and treatment.  PCCM team consulted 06/15 to assist with treatment.    Pertinent  Medical History  Type II Diabetes Mellitus Dyslipidemia  Erectile Dysfunction Herpes Labialis HLD Hyperlipidemia  Prostate Cancer Renal Stones Thyroid Nodule   Significant Hospital Events: Including procedures, antibiotic start and stop dates in addition to other pertinent events   6/14: admitted for severe sepsis and pancreatitis with lactic acidosis 6/15: PCCM  consulted for worsening pancreatitis acidosis renal failure and closer monitoring 6/16: Pt with worsening acute kidney injury nephrology consulted recommended hemodialysis  6/16: Started on dialysis 6/17: Will get dialysis today still altered but improved compared to yesterday 6/18: Patient lipase is still 2 improving his mentation is improving his urine is clearing but he has minimal urine output and creatinine is gone up Interim History / Subjective:  Back with hematuria despite stopping the heparin  Objective   Blood pressure 139/75, pulse 98, temperature 98.8 F (37.1 C), temperature source Oral, resp. rate (!) 26, height 6' (1.829 m), weight 105.1 kg, SpO2 96 %.        Intake/Output Summary (Last 24 hours) at 06/13/2022 0950 Last data filed at 06/13/2022 0400 Gross per 24 hour  Intake 843.21 ml  Output 1531 ml  Net -687.79 ml    Filed Weights   06/11/22 1530 06/12/22 1405 06/12/22 1722  Weight: 102.7 kg 105.9 kg 105.1 kg    Examination: General: Acutely ill appearing male, 10/10 back pain  HENT: Supple, no JVD  Lungs: Faint crackles throughout, even, non labored  Cardiovascular: Sinus tachycardia, no r/g, 2+ radial/2+ distal pulses, no edema  Abdomen: Faint BS x4, distended, taut, tenderness present  Extremities: Normal bulk and tone, moves all extremities  Neuro: Disoriented to situation at times, follows commands, PERRLA GU: Indwelling foley catheter draining dark yellow urine   Resolved Hospital Problem list     Assessment & Plan:  Severe necrotizing pancreatitis  Portal vein thrombosis~CT Abd Pelvis 06/13 concerning for anterior  right portal vein possible neoplasm or abscess CT from June 16 fluid collection no signs of infection Transaminitis  - Trend WBC and monitor fever curve  - Follow cultures  - Continue zosyn per ID recommendations  - General surgery and GI consulted appreciate input  - Keep NPO for now defer to GI and General Surgery  - Trend hepatic  function panel lipase and liver enzymes are improving - Hold heparin for hematuria  Syncopal episode possibly due to vasovagal episode/hypotension - Continuous telemetry monitoring  -No IV fluids for volume overload continue with dialysis -Mental status is improving. Acute kidney injury with lactic and metabolic acidosis Rhabdomyolysis   Hypocalcemia  Started on dialysis is anuric  Type II diabetes mellitus  - CBG's q4hrs - SSI   Acute pain  - Prn dilaudid for pain management   Best Practice (right click and "Reselect all SmartList Selections" daily)   Diet/type: NPO DVT prophylaxis: LMWH GI prophylaxis: N/A Lines: N/A Foley:  Yes, and it is still needed Code Status:  full code Last date of multidisciplinary goals of care discussion [06/11/2022]   Labs   CBC: Recent Labs  Lab 06/08/22 1057 06/09/22 0415 06/10/22 0532 06/10/22 1529 06/11/22 0419 06/12/22 0449 06/13/22 0450  WBC 19.8* 11.4* 12.6* 14.2* 13.3* 13.8* 12.7*  NEUTROABS 15.6* 9.7*  --   --  11.2* 11.5* 10.1*  HGB 15.9 14.3 15.4 14.3 12.9* 11.2* 10.6*  HCT 50.2 43.9 48.0 44.1 38.8* 33.6* 31.6*  MCV 85.5 84.1 83.8 84.0 82.4 81.2 81.9  PLT 222 157 154 167 157 159 146*     Basic Metabolic Panel: Recent Labs  Lab 06/10/22 1529 06/10/22 1700 06/11/22 0419 06/11/22 1147 06/12/22 0449 06/13/22 0450  NA 137  --  137 137 136 136  K 4.8  --  4.3 4.2 4.2 4.3  CL 102  --  101 99 100 99  CO2 23  --  '26 25 25 24  '$ GLUCOSE 131*  --  138* 131* 156* 182*  BUN 67*  --  78* 85* 80* 83*  CREATININE 5.29*  --  6.32* 6.99* 6.76* 7.54*  CALCIUM 5.6*  --  4.3* <4.0* 4.0* 4.2*  MG  --   --   --   --  1.8 2.1  PHOS  --  3.4  --   --  4.0 5.0*    GFR: Estimated Creatinine Clearance: 10.3 mL/min (A) (by C-G formula based on SCr of 7.54 mg/dL (H)). Recent Labs  Lab 06/09/22 0415 06/10/22 0532 06/10/22 0938 06/10/22 1529 06/10/22 1813 06/11/22 0419 06/11/22 1544 06/12/22 0449 06/13/22 0450  PROCALCITON  6.81 19.36  --   --   --   --   --   --   --   WBC 11.4* 12.6*  --  14.2*  --  13.3*  --  13.8* 12.7*  LATICACIDVEN  --  2.9* 3.5* 4.0* 2.4*  --  1.7  --   --      Liver Function Tests: Recent Labs  Lab 06/09/22 0415 06/10/22 0532 06/11/22 0419 06/12/22 0449 06/13/22 0450  AST 1,895* 593* 262* 162* 106*  ALT 621* 374* 218* 148* 105*  ALKPHOS 57 44 41 51 62  BILITOT 1.4* 1.7* 1.5* 0.9 0.7  PROT 6.1* 5.6* 5.4* 5.4* 5.5*  ALBUMIN 3.4* 2.8* 2.7* 2.4* 2.3*    Recent Labs  Lab 06/08/22 1810 06/09/22 0415 06/10/22 0532 06/12/22 0449  LIPASE 1,633* 1,741* 688* 102*    Recent Labs  Lab 06/10/22 1900  AMMONIA 13     ABG    Component Value Date/Time   PHART 7.41 06/10/2022 1530   PCO2ART 31 (L) 06/10/2022 1530   PO2ART 65 (L) 06/10/2022 1530   HCO3 19.6 (L) 06/10/2022 1530   ACIDBASEDEF 4.0 (H) 06/10/2022 1530   O2SAT 94.9 06/10/2022 1530     Coagulation Profile: Recent Labs  Lab 06/08/22 2028 06/12/22 0449  INR 1.1 1.5*     Cardiac Enzymes: Recent Labs  Lab 06/10/22 1618 06/11/22 0419  CKTOTAL 1,611* 1,740*     HbA1C: Hgb A1c MFr Bld  Date/Time Value Ref Range Status  01/08/2022 10:34 AM 6.2 4.6 - 6.5 % Final    Comment:    Glycemic Control Guidelines for People with Diabetes:Non Diabetic:  <6%Goal of Therapy: <7%Additional Action Suggested:  >8%   07/02/2021 08:46 AM 6.0 4.6 - 6.5 % Final    Comment:    Glycemic Control Guidelines for People with Diabetes:Non Diabetic:  <6%Goal of Therapy: <7%Additional Action Suggested:  >8%     CBG: Recent Labs  Lab 06/12/22 1557 06/12/22 1932 06/12/22 2333 06/13/22 0349 06/13/22 0738  GLUCAP 152* 183* 188* 186* 154*     Review of Systems: Positives in BOLD  Gen: Awake and disoriented improved compared to yesterday HEENT: Denies blurred vision, double vision, hearing loss, tinnitus, sinus congestion, rhinorrhea, sore throat, neck stiffness, dysphagia PULM: Denies shortness of breath, cough, sputum  production, hemoptysis, wheezing CV: Denies chest pain, edema, orthopnea, paroxysmal nocturnal dyspnea, palpitations GI: back/abdominal pain, nausea, vomiting, diarrhea, hematochezia, melena, constipation, change in bowel habits GU: Denies dysuria, hematuria, polyuria, oliguria, urethral discharge Endocrine: Denies hot or cold intolerance, polyuria, polyphagia or appetite change Derm: Denies rash, dry skin, scaling or peeling skin change Heme: Denies easy bruising, bleeding, bleeding gums Neuro: Denies headache, numbness, weakness, slurred speech, loss of memory or consciousness  Past Medical History:  He,  has a past medical history of Adopted, Cataract, Diabetes mellitus, Dyslipidemia, Erectile dysfunction, Herpes labialis, Hyperlipidemia, Male hypogonadism, Prostate cancer ([place]), Renal stones, and Thyroid nodule.   Surgical History:   Past Surgical History:  Procedure Laterality Date   CATARACT EXTRACTION, BILATERAL  2017   Dr. Katy Fitch   COLONOSCOPY WITH PROPOFOL N/A 03/09/2018   Procedure: COLONOSCOPY WITH PROPOFOL;  Surgeon: Lin Landsman, MD;  Location: [facility] ENDOSCOPY;  Service: Gastroenterology;  Laterality: N/A;   COLONOSCOPY WITH PROPOFOL N/A 03/12/2021   Procedure: COLONOSCOPY WITH PROPOFOL;  Surgeon: Lin Landsman, MD;  Location: [place];  Service: Endoscopy;  Laterality: N/A;  priority 4   CYSTOSCOPY W/ URETERAL STENT PLACEMENT  08/09/2017   Procedure: CYSTOSCOPY WITH RETROGRADE PYELOGRAM/URETERAL STENT PLACEMENT;  Surgeon: Royston Cowper, MD;  Location: [facility] ORS;  Service: Urology;;   EXTRACORPOREAL SHOCK WAVE LITHOTRIPSY Left 09/15/2017   Procedure: EXTRACORPOREAL SHOCK WAVE LITHOTRIPSY (ESWL);  Surgeon: Royston Cowper, MD;  Location: [facility] ORS;  Service: Urology;  Laterality: Left;   EYE SURGERY Bilateral 2016   cataract extractions   GREEN LIGHT LASER TURP (TRANSURETHRAL RESECTION OF PROSTATE N/A 04/20/2016   Procedure: GREEN LIGHT LASER TURP  (TRANSURETHRAL RESECTION OF PROSTATE;  Surgeon: Royston Cowper, MD;  Location: [facility] ORS;  Service: Urology;  Laterality: N/A;   INGUINAL HERNIA REPAIR  01/11/2012   Procedure: HERNIA REPAIR INGUINAL ADULT;  Surgeon: Pedro Earls, MD;  Location: [place];  Service: General;  Laterality: Right;  Open right inguinal hernia repair with mesh   POLYPECTOMY  03/12/2021   Procedure: POLYPECTOMY;  Surgeon:  Lin Landsman, MD;  Location: [place];  Service: Endoscopy;;   RADIOACTIVE SEED IMPLANT     PROSTATE   URETEROSCOPY WITH HOLMIUM LASER LITHOTRIPSY Right 08/09/2017   Procedure: URETEROSCOPY WITH HOLMIUM LASER LITHOTRIPSY;  Surgeon: Royston Cowper, MD;  Location: [facility] ORS;  Service: Urology;  Laterality: Right;     Social History:   reports that he quit smoking about 31 years ago. His smoking use included cigarettes. He has never used smokeless tobacco. He reports current alcohol use. He reports that he does not use drugs.   Family History:  His family history is not on file. He was adopted.   Allergies No Known Allergies   Home Medications  Prior to Admission medications   Medication Sig Start Date End Date Taking? Authorizing Provider  acyclovir (ZOVIRAX) 800 MG tablet TAKE 1 TABLET DAILY 01/25/22  Yes Tonia Ghent, MD  aspirin EC 81 MG tablet Take 81 mg by mouth daily. Swallow whole.   Yes [provider]  Calcium Carbonate (CALCIUM 600 PO) Take 2 tablets by mouth daily.   Yes [provider]  GLUCOSAMINE HCL PO Take 1 tablet by mouth daily.    Yes [provider]  losartan (COZAAR) 25 MG tablet Take 1 tablet (25 mg total) by mouth daily. 12/29/21  Yes Tower, Wynelle Fanny, MD  Magnesium 500 MG TABS Take 1 tablet by mouth daily.   Yes [provider]  metFORMIN (GLUCOPHAGE) 500 MG tablet TAKE 1 TABLET DAILY 01/25/22  Yes Tonia Ghent, MD  Multiple Vitamin (MULTIVITAMIN) tablet Take 1 tablet by mouth daily. Reported on  01/07/2016   Yes [provider]  Omega-3 Fatty Acids ([employer] FISH OIL PO) Take 1,000 mg by mouth daily.   Yes [provider]  Red Yeast Rice 600 MG TABS Take 2 tablets (1,200 mg total) by mouth daily. 01/08/22  Yes Tonia Ghent, MD  rosuvastatin (CRESTOR) 20 MG tablet TAKE 1 TABLET EVERY OTHER  DAY IN THE MORNING. NEEDS TO CALL OFFICE TO SCHEDULE YEARLY EXAM 04/12/22  Yes Tonia Ghent, MD  Testosterone 1.62 % GEL Apply 2 Pump topically daily. 05/12/22  Yes [provider]

## 2022-06-13 NOTE — Progress Notes (Signed)
06/13/2022  Subjective: No acute events overnight.  WBC slightly improved today to 12.7, Hgb slowly decreasing.  He had hemodialysis yesterday which he tolerated well.  He's more awake.  Reports lower abdominal discomfort, and feels that his pain overall is improving.  Tolerating tube feeds, but no bowel function recorded yet.  Vital signs: Temp:  [98.2 F (36.8 C)-100.3 F (37.9 C)] 98.8 F (37.1 C) (06/18 0740) Pulse Rate:  [92-116] 98 (06/18 0800) Resp:  [17-38] 26 (06/18 0800) BP: (93-152)/(58-118) 139/75 (06/18 0800) SpO2:  [92 %-98 %] 96 % (06/18 0800) Weight:  [105.1 kg-105.9 kg] 105.1 kg (06/17 1722)   Intake/Output: 06/17 0701 - 06/18 0700 In: 1269.1 [I.V.:35.9; NG/GT:1070; IV Piggyback:163.2] Out: 1571 [Urine:670; Stool:1] Last BM Date :  (PTA)  Physical Exam: Constitutional:  No acute distress, somnolent but able to answer my questions. Abdomen:  soft, some distention, with more tenderness in lower abdomen, and some tenderness in upper abdomen as well.  Dobhoff in place with tube feeds running.  Labs:  Recent Labs    06/12/22 0449 06/13/22 0450  WBC 13.8* 12.7*  HGB 11.2* 10.6*  HCT 33.6* 31.6*  PLT 159 146*   Recent Labs    06/12/22 0449 06/13/22 0450  NA 136 136  K 4.2 4.3  CL 100 99  CO2 25 24  GLUCOSE 156* 182*  BUN 80* 83*  CREATININE 6.76* 7.54*  CALCIUM 4.0* 4.2*   Recent Labs    06/12/22 0449  LABPROT 17.7*  INR 1.5*    Imaging: No results found.  Assessment/Plan: This is a 76 y.o. male with necrotizing pancreatitis.  --No surgical intervention needed at this point.  He has remained afebrile and WBC slightly trending down.  LFTs continue to improve. --Discussed with him that the marked inflammatory response from his necrotizing pancreatitis can sometimes result in ileus.  He denies any nausea, and his lower abdominal pain may be related to constipation.  Recommend bowel regimen, but would not crush anything and push through the dobhoff  as this would easily get clogged.  May be better with PR medications. --Continue tube feeds for now and monitor his distention/bowel function.  As his pain resolves and his mentation improves, may be able to start po trials. --Will follow peripherally.   I spent 35 minutes dedicated to the care of this patient on the date of this encounter to include pre-visit review of records, face-to-face time with the patient discussing diagnosis and management, and any post-visit coordination of care.  Melvyn Neth, [facility]

## 2022-06-13 NOTE — Progress Notes (Signed)
\ PROGRESS NOTE    Xavier Esparza  HYI:[id number] DOB: 1946/02/02 DOA: 06/08/2022 PCP: Tonia Ghent, MD    Brief Narrative:  76 y.o. male with medical history significant for DM, BPH, history of prostate cancer, kidney stones status post lithotripsy, negative colonoscopy 2022 (3 benign polyps), who presents to the ED by EMS following a syncopal episode at home while walking to the bathroom.  Wife said she heard a loud thud and went to find him on the floor.  She said he was awake when she got there but he could not help himself up and she called 911.  Patient states he was in his usual state of health but about 2 weeks prior he saw a little blood in his urine similar to when he had his kidney stone.  He saw his urologist yesterday who gave him a prescription for Levaquin.  He otherwise denied feeling unwell previous to the episode.  He denied chest pain, shortness of breath, headache or visual disturbance, one-sided numbness weakness or tingling, nausea or vomiting, abdominal pain, urinary frequency or burning or diarrhea.[facility] reported BP 76/45 and he was treated with IM epinephrine and a 400 mL fluid bolus.  He was awake and alert x4 by arrival.  After his arrival in the emergency room he developed abdominal pain and vomiting.   Initial CT abdomen pelvis with moderate acute pancreatitis no evidence of pancreatic necrosis or pseudocyst.  Follow-up MRI abdomen concerning for possible choledocholithiasis with evidence of necrotizing pancreatitis.  Case discussed with GI Dr. Marius Ditch at [facility].   There was mention of possible portal vein thrombosis.  Heparin GTT was initially started however patient developed suspected acute hematuria so heparin GTT was stopped.  VQ scan ordered and perfusion component will be completed prior to patient transfer.   6/15: Clinical status overall unchanged.  Patient remains with abdominal pain associated with nausea.  Hemodynamically stable.  Mild elevation in white count.   Lipase and LFTs have improved over interval.  Discussed case with GI and general surgery.  Likely that patient's presentation is secondary to necrotizing pancreatitis.  Currently as there is no CBD stone and there is no strong indication for transfer for ERCP.  From a surgical standpoint no intervention is recommended.  There is nothing from an interventional radiology standpoint to drain.  As such we will discontinue transfer and keep patient in [facility].  6/16: No appreciable change in clinical status.  LFTs are downtrending.  White count slightly downtrending.  No fevers noted over interval.  Increasing abdominal distention noted.  Ascites and peripancreatic fluid seen on repeat CAT scan today.  Discussed case with PCCM, nephrology, GI.  Will attempt postpyloric feedings.  Kidney function continues to deteriorate.  Nephrology will initiate hemodialysis today.  6/17: No change in clinical status.  White count remains minimally elevated.  No fevers over interval.  Remains with abdominal distention.  Started HD yesterday.  We will run another round of HD today.  6/18: No appreciable clinical changes.  White blood cell count slightly downtrending.  Creatinine worsened despite 2 rounds of hemodialysis.  Remains with hematuria despite stopping heparin 2 days ago.   Assessment & Plan:   Principal Problem:   Severe sepsis ([place]) Active Problems:   Acute pancreatitis   Liver mass, right lobe, abscess vs mass   Portal vein thrombosis   Syncope   AKI (acute kidney injury) ([place])   HTN (hypertension)   Diabetes mellitus without complication (H[facility])   BPH (benign prostatic  hyperplasia)   Nephrolithiasis   History of prostate cancer   Necrotizing pancreatitis  Severe necrotizing pancreatitis Portal vein thrombosis Patient presented with intractable nausea and vomiting.  Ill-defined heterogeneous mass in the liver.  MRI and MRCP revealed severe acute necrotizing pancreatitis with associated portal venous  thrombosis.  No evidence of CBD stone.  Microabscess noted.  Plan: Complex management.  Will need involvement from multiple consult services.  Consultants engaged include GI, general surgery, ID, nephrology, ICU.    IV fluids currently on hold.  Appreciate ICU assistance.  Continue tube feeds at 40 cc/h.  IV pain meds, IV antiemetics.  Continue holding heparin GTT.  Patient with persistent hematuria despite stopping heparin previously.  Hemoglobin also drifting downward.  At this time risks of anticoagulation outweigh benefits.  We will continue to monitor carefully and restart anticoagulation as appropriate  Acute kidney injury progressing Significant and in the setting of aggressive fluid resuscitation.  AKI likely secondary to ATN in the setting of severe acute pancreatitis Vas-Cath placed and initiated on HD 6/16 Status post inpatient HD 6/16, 6/17 Plan: Creatinine worsening despite hemodialysis.  Will likely run another round of HD 6/19  Syncopal event Suspect secondary to hypotension versus vasovagal event PE ruled out, VQ scan low probability Continue telemetry monitoring  Hematuria History of nephrolithiasis History history of BPH History of prostate cancer No evidence of urinary retention despite worsening kidney function Continue Foley catheter  Diabetes mellitus without complication Sliding-scale coverage    DVT prophylaxis: Heparin GTT Code Status: Full Family Communication: Wife at bedside 6/15, 6/17 Disposition Plan: Status is: Inpatient Remains inpatient appropriate because: Severe acute necrotizing pancreatitis   Level of care: Stepdown  Consultants:  ICU GI General surgery ID Nephrology  Procedures:  None  Antimicrobials: Zosyn   Subjective: Patient seen and examined.  Lying in bed.  Appears uncomfortable  Objective: Vitals:   06/13/22 0600 06/13/22 0700 06/13/22 0740 06/13/22 0800  BP: 111/79 128/76  139/75  Pulse: 93 100  98  Resp: 17  (!) 28  (!) 26  Temp:   98.8 F (37.1 C)   TempSrc:   Oral   SpO2: 93% 95%  96%  Weight:      Height:        Intake/Output Summary (Last 24 hours) at 06/13/2022 1116 Last data filed at 06/13/2022 0400 Gross per 24 hour  Intake 700.28 ml  Output 1531 ml  Net -830.72 ml   Filed Weights   06/11/22 1530 06/12/22 1405 06/12/22 1722  Weight: 102.7 kg 105.9 kg 105.1 kg    Examination:  General exam: Uncomfortable appearing.  Somnolent Respiratory system: Clear to auscultation. Respiratory effort normal. Cardiovascular system: S1-S2, RRR, no murmurs, no pedal edema Gastrointestinal system: Distended, right lower quadrant tenderness Central nervous system: Alert and oriented. No focal neurological deficits. Extremities: Symmetric 5 x 5 power. Skin: No rashes, lesions or ulcers Psychiatry: Judgement and insight appear normal. Mood & affect flattened.     Data Reviewed: I have personally reviewed following labs and imaging studies  CBC: Recent Labs  Lab 06/08/22 1057 06/09/22 0415 06/10/22 0532 06/10/22 1529 06/11/22 0419 06/12/22 0449 06/13/22 0450  WBC 19.8* 11.4* 12.6* 14.2* 13.3* 13.8* 12.7*  NEUTROABS 15.6* 9.7*  --   --  11.2* 11.5* 10.1*  HGB 15.9 14.3 15.4 14.3 12.9* 11.2* 10.6*  HCT 50.2 43.9 48.0 44.1 38.8* 33.6* 31.6*  MCV 85.5 84.1 83.8 84.0 82.4 81.2 81.9  PLT 222 157 154 167 157 159 146*  Basic Metabolic Panel: Recent Labs  Lab 06/10/22 1529 06/10/22 1700 06/11/22 0419 06/11/22 1147 06/12/22 0449 06/13/22 0450  NA 137  --  137 137 136 136  K 4.8  --  4.3 4.2 4.2 4.3  CL 102  --  101 99 100 99  CO2 23  --  '26 25 25 24  '$ GLUCOSE 131*  --  138* 131* 156* 182*  BUN 67*  --  78* 85* 80* 83*  CREATININE 5.29*  --  6.32* 6.99* 6.76* 7.54*  CALCIUM 5.6*  --  4.3* <4.0* 4.0* 4.2*  MG  --   --   --   --  1.8 2.1  PHOS  --  3.4  --   --  4.0 5.0*   GFR: Estimated Creatinine Clearance: 10.3 mL/min (A) (by C-G formula based on SCr of 7.54 mg/dL  (H)). Liver Function Tests: Recent Labs  Lab 06/09/22 0415 06/10/22 0532 06/11/22 0419 06/12/22 0449 06/13/22 0450  AST 1,895* 593* 262* 162* 106*  ALT 621* 374* 218* 148* 105*  ALKPHOS 57 44 41 51 62  BILITOT 1.4* 1.7* 1.5* 0.9 0.7  PROT 6.1* 5.6* 5.4* 5.4* 5.5*  ALBUMIN 3.4* 2.8* 2.7* 2.4* 2.3*   Recent Labs  Lab 06/08/22 1810 06/09/22 0415 06/10/22 0532 06/12/22 0449  LIPASE 1,633* 1,741* 688* 102*   Recent Labs  Lab 06/10/22 1900  AMMONIA 13   Coagulation Profile: Recent Labs  Lab 06/08/22 2028 06/12/22 0449  INR 1.1 1.5*   Cardiac Enzymes: Recent Labs  Lab 06/10/22 1618 06/11/22 0419  CKTOTAL 1,611* 1,740*   BNP (last 3 results) No results for input(s): "PROBNP" in the last 8760 hours. HbA1C: No results for input(s): "HGBA1C" in the last 72 hours. CBG: Recent Labs  Lab 06/12/22 1932 06/12/22 2333 06/13/22 0349 06/13/22 0738 06/13/22 1110  GLUCAP 183* 188* 186* 154* 158*   Lipid Profile: Recent Labs    06/10/22 1529  TRIG 135   Thyroid Function Tests: No results for input(s): "TSH", "T4TOTAL", "FREET4", "T3FREE", "THYROIDAB" in the last 72 hours. Anemia Panel: No results for input(s): "VITAMINB12", "FOLATE", "FERRITIN", "TIBC", "IRON", "RETICCTPCT" in the last 72 hours. Sepsis Labs: Recent Labs  Lab 06/09/22 0415 06/10/22 0532 06/10/22 0938 06/10/22 1529 06/10/22 1813 06/11/22 1544  PROCALCITON 6.81 19.36  --   --   --   --   LATICACIDVEN  --  2.9* 3.5* 4.0* 2.4* 1.7    Recent Results (from the past 240 hour(s))  Blood culture (routine x 2)     Status: None   Collection Time: 06/08/22  1:33 PM   Specimen: BLOOD  Result Value Ref Range Status   Specimen Description BLOOD LEFT HAND  Final   Special Requests   Final    BOTTLES DRAWN AEROBIC AND ANAEROBIC Blood Culture results may not be optimal due to an inadequate volume of blood received in culture bottles   Culture   Final    NO GROWTH 5 DAYS Performed at [facility], [address], [place], [place] [address]    Report Status 06/13/2022 FINAL  Final  Blood culture (routine x 2)     Status: None   Collection Time: 06/08/22  2:15 PM   Specimen: BLOOD  Result Value Ref Range Status   Specimen Description BLOOD BLOOD LEFT WRIST  Final   Special Requests   Final    BOTTLES DRAWN AEROBIC AND ANAEROBIC Blood Culture results may not be optimal due to an inadequate volume of blood  received in culture bottles   Culture   Final    NO GROWTH 5 DAYS Performed at [facility], [address] [place], [place] [address]    Report Status 06/13/2022 FINAL  Final  MRSA Next Gen by PCR, Nasal     Status: None   Collection Time: 06/10/22  4:14 PM   Specimen: Nasal Mucosa; Nasal Swab  Result Value Ref Range Status   MRSA by PCR Next Gen NOT DETECTED NOT DETECTED Final    Comment: (NOTE) The GeneXpert MRSA Assay (FDA approved for NASAL specimens only), is one component of a comprehensive MRSA colonization surveillance program. It is not intended to diagnose MRSA infection nor to guide or monitor treatment for MRSA infections. Test performance is not FDA approved in patients less than 28 years old. Performed at [facility], [address] [place], [place] [address]   SARS Coronavirus 2 by RT PCR (hospital order, performed in [facility] hospital lab) *cepheid single result test* Anterior Nasal Swab     Status: None   Collection Time: 06/10/22  5:17 PM   Specimen: Anterior Nasal Swab  Result Value Ref Range Status   SARS Coronavirus 2 by RT PCR NEGATIVE NEGATIVE Final    Comment: (NOTE) SARS-CoV-2 target nucleic acids are NOT DETECTED.  The SARS-CoV-2 RNA is generally detectable in upper and lower respiratory specimens during the acute phase of infection. The lowest concentration of SARS-CoV-2 viral copies this assay can detect is 250 copies / mL. A negative result does not preclude SARS-CoV-2 infection and should not be used as the  sole basis for treatment or other patient management decisions.  A negative result may occur with improper specimen collection / handling, submission of specimen other than nasopharyngeal swab, presence of viral mutation(s) within the areas targeted by this assay, and inadequate number of viral copies (<250 copies / mL). A negative result must be combined with clinical observations, patient history, and epidemiological information.  Fact Sheet for Patients:   https://www.patel.info/  Fact Sheet for Healthcare Providers: https://hall.com/  This test is not yet approved or  cleared by the [place] FDA and has been authorized for detection and/or diagnosis of SARS-CoV-2 by FDA under an Emergency Use Authorization (EUA).  This EUA will remain in effect (meaning this test can be used) for the duration of the COVID-19 declaration under Section 564(b)(1) of the Act, 21 U.S.C. section 360bbb-3(b)(1), unless the authorization is terminated or revoked sooner.  Performed at [facility], [address] [place], [place] [address]          Radiology Studies: [facility] Chest [place] 1 View  Addendum Date: 06/11/2022   ADDENDUM REPORT: 06/11/2022 17:02 ADDENDUM: Impression #1 called to Dr. Milon Dikes of the ICU at 2:58 p.m. on 06/11/2022. Electronically Signed   By: Kellie Simmering D.O.   On: 06/11/2022 17:02   Result Date: 06/11/2022 CLINICAL DATA:  Provided history: Encounter for central line and NG tube placement. EXAM: PORTABLE CHEST 1 VIEW COMPARISON:  Prior chest radiographs 06/08/2022 and earlier. Same day CT abdomen/pelvis 06/11/2022. FINDINGS: Left IJ approach central venous catheter terminating in the expected location of the brachiocephalic/SVC confluence. An enteric tube is coiled at the level of the lower neck and terminates at the level of the distal esophagus. Shallow inspiration radiograph, accentuating the cardiac silhouette and limiting  evaluation of heart size. Small bilateral pleural effusions better appreciated on same day CT abdomen/pelvis. Mild bibasilar atelectasis. No evidence of pneumothorax. No acute bony abnormality  identified. Degenerative changes of the left glenohumeral joint. Attempts are being made to reach the ordering provider at this time regarding impression #1. IMPRESSION: An enteric tube is coiled at the level of the lower neck and terminates at the level of the distal esophagus. Left IJ approach central venous catheter terminating in the expected location of the brachiocephalic/SVC confluence. Shallow inspiration radiograph with small bilateral pleural effusions and bibasilar atelectasis. Electronically Signed: By: Kellie Simmering D.O. On: 06/11/2022 14:34   DG Naso G Tube Plc W/Fl W/Rad  Result Date: 06/11/2022 CLINICAL DATA:  History of prostate cancer, sepsis, abdominal pain, pancreatitis EXAM: NASO G TUBE PLACEMENT WITH FL AND WITH RAD CONTRAST:  5 mL Omnipaque 300 FLUOROSCOPY: Radiation Exposure Index (if provided by the fluoroscopic device): 41.4 mGy Number of Acquired Spot Images: 0 COMPARISON:  None Available. FINDINGS: Dobbhoff tube was inserted through the right nostril and advanced into the stomach. Dobbhoff tube was foot the further advanced into the fourth portion of the duodenum. Stylet was removed and tube was secured to the skin surface of the nose. No immediate complications.  Incidentally noted small hiatal hernia. IMPRESSION: Successful placement of a Dobbhoff tube with the tip located distal to the pylorus in the fourth portion of the duodenum. Electronically Signed   By: Kathreen Devoid M.D.   On: 06/11/2022 15:20        Scheduled Meds:  Chlorhexidine Gluconate Cloth  6 each Topical Q0600   feeding supplement (PROSource TF)  45 mL Per Tube BID   free water  30 mL Per Tube Q4H   insulin aspart  0-15 Units Subcutaneous Q4H   midazolam  1 mg Intravenous Once   multivitamin  1 tablet Per Tube QHS    QUEtiapine  50 mg Per Tube QHS   Continuous Infusions:  feeding supplement (OSMOLITE 1.5 CAL) 65 mL/hr at 06/13/22 0400   lactated ringers Stopped (06/12/22 0435)   piperacillin-tazobactam (ZOSYN)  IV Stopped (06/13/22 0336)     LOS: 5 days         Sidney Ace, MD [facility]   If 7PM-7AM, please contact night-coverage  06/13/2022, 11:16 AM

## 2022-06-13 NOTE — Progress Notes (Signed)
Notified Sharion Settler NP of pt minimal uop. No new orders given.

## 2022-06-13 NOTE — Progress Notes (Signed)
[facility]  ROUNDING NOTE   Subjective:   Patient seen and evaluated at bedside in ICU Abdomen is distended Currently on room air. Somnolent but able to answer few simple questions.  States his abdomen is not hurting as bad as before. NG tube in place.  Tube feeds at 65 cc/h Currently has Foley in place with blood-tinged urine  Objective:  Vital signs in last 24 hours:  Temp:  [98.2 F (36.8 C)-100.3 F (37.9 C)] 98.8 F (37.1 C) (06/18 0740) Pulse Rate:  [88-116] 98 (06/18 0800) Resp:  [17-38] 26 (06/18 0800) BP: (93-152)/(58-118) 139/75 (06/18 0800) SpO2:  [92 %-98 %] 96 % (06/18 0800) Weight:  [105.1 kg-105.9 kg] 105.1 kg (06/17 1722)  Weight change: 3.2 kg Filed Weights   06/11/22 1530 06/12/22 1405 06/12/22 1722  Weight: 102.7 kg 105.9 kg 105.1 kg    Intake/Output: I/O last 3 completed shifts: In: 2845.4 [I.V.:882.8; NG/GT:1699.4; IV Piggyback:263.2] Out: 4944 [Urine:890; Other:900; Stool:1]   Intake/Output this shift:  No intake/output data recorded.  Physical Exam: General: NAD, restless  Head: Normocephalic, atraumatic. Moist oral mucosal membranes  Eyes: Anicteric  Lungs:  Room air, bilateral coarse breath sounds  Heart: Regular rate and rhythm  Abdomen:  Distended, mild diffuse tenderness  Extremities: Some dependent peripheral edema.  Neurologic: Somnolent but answering simple questions.  Skin: Warm, dry  Access: Left IJ dialysis catheter 06/01/2022.  Donell Beers.    Basic Metabolic Panel: Recent Labs  Lab 06/10/22 1529 06/10/22 1700 06/11/22 0419 06/11/22 1147 06/12/22 0449 06/13/22 0450  NA 137  --  137 137 136 136  K 4.8  --  4.3 4.2 4.2 4.3  CL 102  --  101 99 100 99  CO2 23  --  '26 25 25 24  '$ GLUCOSE 131*  --  138* 131* 156* 182*  BUN 67*  --  78* 85* 80* 83*  CREATININE 5.29*  --  6.32* 6.99* 6.76* 7.54*  CALCIUM 5.6*  --  4.3* <4.0* 4.0* 4.2*  MG  --   --   --   --  1.8 2.1  PHOS  --  3.4  --   --  4.0 5.0*      Liver Function Tests: Recent Labs  Lab 06/09/22 0415 06/10/22 0532 06/11/22 0419 06/12/22 0449 06/13/22 0450  AST 1,895* 593* 262* 162* 106*  ALT 621* 374* 218* 148* 105*  ALKPHOS 57 44 41 51 62  BILITOT 1.4* 1.7* 1.5* 0.9 0.7  PROT 6.1* 5.6* 5.4* 5.4* 5.5*  ALBUMIN 3.4* 2.8* 2.7* 2.4* 2.3*    Recent Labs  Lab 06/08/22 1810 06/09/22 0415 06/10/22 0532 06/12/22 0449  LIPASE 1,633* 1,741* 688* 102*    Recent Labs  Lab 06/10/22 1900  AMMONIA 13     CBC: Recent Labs  Lab 06/08/22 1057 06/09/22 0415 06/10/22 0532 06/10/22 1529 06/11/22 0419 06/12/22 0449 06/13/22 0450  WBC 19.8* 11.4* 12.6* 14.2* 13.3* 13.8* 12.7*  NEUTROABS 15.6* 9.7*  --   --  11.2* 11.5* 10.1*  HGB 15.9 14.3 15.4 14.3 12.9* 11.2* 10.6*  HCT 50.2 43.9 48.0 44.1 38.8* 33.6* 31.6*  MCV 85.5 84.1 83.8 84.0 82.4 81.2 81.9  PLT 222 157 154 167 157 159 146*     Cardiac Enzymes: Recent Labs  Lab 06/10/22 1618 06/11/22 0419  CKTOTAL 1,611* 1,740*     BNP: Invalid input(s): "POCBNP"  CBG: Recent Labs  Lab 06/12/22 1557 06/12/22 1932 06/12/22 2333 06/13/22 0349 06/13/22 0738  GLUCAP 152* 183* 188*  61* 154*     Microbiology: Results for orders placed or performed during the hospital encounter of 06/08/22  Blood culture (routine x 2)     Status: None   Collection Time: 06/08/22  1:33 PM   Specimen: BLOOD  Result Value Ref Range Status   Specimen Description BLOOD LEFT HAND  Final   Special Requests   Final    BOTTLES DRAWN AEROBIC AND ANAEROBIC Blood Culture results may not be optimal due to an inadequate volume of blood received in culture bottles   Culture   Final    NO GROWTH 5 DAYS Performed at [facility], [address] [place], [place] [address]    Report Status 06/13/2022 FINAL  Final  Blood culture (routine x 2)     Status: None   Collection Time: 06/08/22  2:15 PM   Specimen: BLOOD  Result Value Ref Range Status   Specimen Description BLOOD  BLOOD LEFT WRIST  Final   Special Requests   Final    BOTTLES DRAWN AEROBIC AND ANAEROBIC Blood Culture results may not be optimal due to an inadequate volume of blood received in culture bottles   Culture   Final    NO GROWTH 5 DAYS Performed at [facility], [address], [place], [place] [address]    Report Status 06/13/2022 FINAL  Final  MRSA Next Gen by PCR, Nasal     Status: None   Collection Time: 06/10/22  4:14 PM   Specimen: Nasal Mucosa; Nasal Swab  Result Value Ref Range Status   MRSA by PCR Next Gen NOT DETECTED NOT DETECTED Final    Comment: (NOTE) The GeneXpert MRSA Assay (FDA approved for NASAL specimens only), is one component of a comprehensive MRSA colonization surveillance program. It is not intended to diagnose MRSA infection nor to guide or monitor treatment for MRSA infections. Test performance is not FDA approved in patients less than 32 years old. Performed at [facility], [address] [place], [place] [address]   SARS Coronavirus 2 by RT PCR (hospital order, performed in [facility] hospital lab) *cepheid single result test* Anterior Nasal Swab     Status: None   Collection Time: 06/10/22  5:17 PM   Specimen: Anterior Nasal Swab  Result Value Ref Range Status   SARS Coronavirus 2 by RT PCR NEGATIVE NEGATIVE Final    Comment: (NOTE) SARS-CoV-2 target nucleic acids are NOT DETECTED.  The SARS-CoV-2 RNA is generally detectable in upper and lower respiratory specimens during the acute phase of infection. The lowest concentration of SARS-CoV-2 viral copies this assay can detect is 250 copies / mL. A negative result does not preclude SARS-CoV-2 infection and should not be used as the sole basis for treatment or other patient management decisions.  A negative result may occur with improper specimen collection / handling, submission of specimen other than nasopharyngeal swab, presence of viral mutation(s) within the areas targeted by  this assay, and inadequate number of viral copies (<250 copies / mL). A negative result must be combined with clinical observations, patient history, and epidemiological information.  Fact Sheet for Patients:   https://www.patel.info/  Fact Sheet for Healthcare Providers: https://hall.com/  This test is not yet approved or  cleared by the [place] FDA and has been authorized for detection and/or diagnosis of SARS-CoV-2 by FDA under an Emergency Use Authorization (EUA).  This EUA will remain in effect (meaning this test can be used) for the duration of the COVID-19 declaration  under Section 564(b)(1) of the Act, 21 U.S.C. section 360bbb-3(b)(1), unless the authorization is terminated or revoked sooner.  Performed at [facility], [address] [place], [place] [address]     Coagulation Studies: Recent Labs    06/12/22 0449  LABPROT 17.7*  INR 1.5*     Urinalysis: No results for input(s): "COLORURINE", "LABSPEC", "PHURINE", "GLUCOSEU", "HGBUR", "BILIRUBINUR", "KETONESUR", "PROTEINUR", "UROBILINOGEN", "NITRITE", "LEUKOCYTESUR" in the last 72 hours.  Invalid input(s): "APPERANCEUR"     Imaging: DG Chest Port 1 View  Addendum Date: 06/11/2022   ADDENDUM REPORT: 06/11/2022 17:02 ADDENDUM: Impression #1 called to Dr. Milon Dikes of the ICU at 2:58 p.m. on 06/11/2022. Electronically Signed   By: Kellie Simmering D.O.   On: 06/11/2022 17:02   Result Date: 06/11/2022 CLINICAL DATA:  Provided history: Encounter for central line and NG tube placement. EXAM: PORTABLE CHEST 1 VIEW COMPARISON:  Prior chest radiographs 06/08/2022 and earlier. Same day CT abdomen/pelvis 06/11/2022. FINDINGS: Left IJ approach central venous catheter terminating in the expected location of the brachiocephalic/SVC confluence. An enteric tube is coiled at the level of the lower neck and terminates at the level of the distal esophagus. Shallow inspiration  radiograph, accentuating the cardiac silhouette and limiting evaluation of heart size. Small bilateral pleural effusions better appreciated on same day CT abdomen/pelvis. Mild bibasilar atelectasis. No evidence of pneumothorax. No acute bony abnormality identified. Degenerative changes of the left glenohumeral joint. Attempts are being made to reach the ordering provider at this time regarding impression #1. IMPRESSION: An enteric tube is coiled at the level of the lower neck and terminates at the level of the distal esophagus. Left IJ approach central venous catheter terminating in the expected location of the brachiocephalic/SVC confluence. Shallow inspiration radiograph with small bilateral pleural effusions and bibasilar atelectasis. Electronically Signed: By: Kellie Simmering D.O. On: 06/11/2022 14:34   DG Naso G Tube Plc W/Fl W/Rad  Result Date: 06/11/2022 CLINICAL DATA:  History of prostate cancer, sepsis, abdominal pain, pancreatitis EXAM: NASO G TUBE PLACEMENT WITH FL AND WITH RAD CONTRAST:  5 mL Omnipaque 300 FLUOROSCOPY: Radiation Exposure Index (if provided by the fluoroscopic device): 41.4 mGy Number of Acquired Spot Images: 0 COMPARISON:  None Available. FINDINGS: Dobbhoff tube was inserted through the right nostril and advanced into the stomach. Dobbhoff tube was foot the further advanced into the fourth portion of the duodenum. Stylet was removed and tube was secured to the skin surface of the nose. No immediate complications.  Incidentally noted small hiatal hernia. IMPRESSION: Successful placement of a Dobbhoff tube with the tip located distal to the pylorus in the fourth portion of the duodenum. Electronically Signed   By: Kathreen Devoid M.D.   On: 06/11/2022 15:20   CT ABDOMEN PELVIS WO CONTRAST  Result Date: 06/11/2022 CLINICAL DATA:  Sepsis, abdominal pain. History of prostate cancer. History of renal calculi. Syncope. Pancreatitis with focal pancreatic necrosis on 06/08/2022 MRI. 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 EXAM: CT ABDOMEN AND PELVIS WITHOUT CONTRAST TECHNIQUE: Multidetector CT imaging of the abdomen and pelvis was performed following the standard protocol without IV contrast. RADIATION DOSE REDUCTION: This exam was performed according to the departmental dose-optimization program which includes automated exposure control, adjustment of the mA and/or kV according to patient size and/or use of iterative reconstruction technique. COMPARISON:  Multiple exams, including MRI 06/09/2022 and 06/08/2022 FINDINGS: Lower chest: Small but increased bilateral pleural effusions with passive atelectasis. Coronary artery atherosclerotic calcifications.  Hepatobiliary: Heterogeneous density in the right hepatic lobe corresponding to the prior region of portal vein thrombosis and suspected micro abscesses. Scattered hepatic fluid density lesions compatible with cysts throughout the rest of the liver. High density material in the gallbladder, possibly sludge. Probable sludge in the nondilated common bile duct. Pancreas: Substantial peripancreatic edema. No gas density is identified within the pancreatic parenchyma or previous regions of pancreatic necrosis to indicate super infection with gas-forming organism. Acute peripancreatic fluid collections appear increased and there is increased surrounding mesenteric edema and fluid tracking along the paracolic gutters and anterior margins the perirenal fascia. Spleen: Unremarkable Adrenals/Urinary Tract: Two nonobstructive right renal calculi are observed, the larger measuring 0.7 cm in the right kidney lower pole on image 68 series 5. Benign 4.4 cm left kidney lower pole cyst. There is substantial contrast medium in the renal parenchyma bilaterally on today's noncontrast examination, indicating persistent nephrograms compatible with acute renal failure. This is likely left over from prior contrast enhanced studies  from 06/08/2022 and possibly 06/09/2022. Correlate with urine production. Stomach/Bowel: Small type 1 hiatal hernia. Indistinct descending and transverse duodenum likely from secondary inflammation. Scattered colonic diverticula. Descending and sigmoid colon diverticulosis. Vascular/Lymphatic: Atherosclerosis is present, including aortoiliac atherosclerotic disease. Reproductive: Brachytherapy seed implants in the prostate gland. Other: Increased ascites likely related to the pancreatitis. There is also increasing presacral edema. Musculoskeletal: Lumbar spondylosis and degenerative disc disease causing multilevel impingement. IMPRESSION: 1. Increased ascites and acute peripancreatic fluid collections. No gas is identified within the pancreatic parenchyma to suggest superinfection of the prior pancreatic necrosis with gas-forming organism at this time. Today's exam is performed without IV contrast and accordingly assessment for further pancreatic necrosis is not feasible. 2. Delayed nephrogram with contrast in the kidneys left over from the studies from 2-3 days ago. Appearance compatible with acute renal failure. 3. Small but increased bilateral pleural effusions with passive atelectasis. 4. 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 5. Two nonobstructive right renal calculi are observed. 6. Other imaging findings of potential clinical significance: Aortic Atherosclerosis (ICD10-I70.0). Brachytherapy seed implants in the prostate gland. Multilevel lumbar impingement. Small type 1 hiatal hernia. Descending and sigmoid colon diverticulosis. Secondary inflammation of the descending and transverse duodenum. Electronically Signed   By: Van Clines M.D.   On: 06/11/2022 09:34     Medications:    calcium gluconate 2,000 mg (06/13/22 0836)   feeding supplement (OSMOLITE 1.5 CAL) 65  mL/hr at 06/13/22 0400   lactated ringers Stopped (06/12/22 0435)   piperacillin-tazobactam (ZOSYN)  IV Stopped (06/13/22 0336)    Chlorhexidine Gluconate Cloth  6 each Topical Q0600   feeding supplement (PROSource TF)  45 mL Per Tube BID   free water  30 mL Per Tube Q4H   insulin aspart  0-15 Units Subcutaneous Q4H   midazolam  1 mg Intravenous Once   multivitamin  1 tablet Per Tube QHS   QUEtiapine  50 mg Per Tube QHS   acetaminophen **OR** acetaminophen, alteplase, heparin, HYDROmorphone (DILAUDID) injection, lidocaine (PF), lidocaine-prilocaine, metoCLOPramide (REGLAN) injection, oxyCODONE, pentafluoroprop-tetrafluoroeth, prochlorperazine  Assessment/ Plan:  Mr. Xavier Esparza is a 76 y.o.  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  Normal kidney function prior to admission.  IV contrast exposure on 06/08/2022 and 06/09/2022.  Renal ultrasound negative for obstruction.  Urinalysis shows hematuria and mild proteinuria.   ?  Gross hematuria related to Foley catheter.  Patient has history of prostate cancer.   Patient remains oliguric, urine output of 670 cc from yesterday.  Received first dialysis treatment on June 16.   -Last hemodialysis 6/17.  Ultrafiltration 900 cc.Tolerated well. -BUN and creatinine are still elevated.  However volume status and electrolytes are acceptable.  Will likely set up for hemodialysis tomorrow. Lab Results  Component Value Date   CREATININE 7.54 (H) 06/13/2022    CREATININE 6.76 (H) 06/12/2022   CREATININE 6.99 (H) 06/11/2022    Intake/Output Summary (Last 24 hours) at 06/13/2022 0850 Last data filed at 06/13/2022 0400 Gross per 24 hour  Intake 873.21 ml  Output 1531 ml  Net -657.79 ml    Acute pancreatitis, severe necrotizing.  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  Currently n.p.o.  Supportive care with antiemetics and pain control.   Currently getting IV Zosyn.  Dosed by pharmacy.   Diabetes mellitus type 2 without complication.Home regimen includes metformin.  Hemoglobin A1c 6.2 on 01/08/2022.  -Agree with holding metformin.  Case discussed with wife, Ms. Reginold Agent by phone.  LOS: 5 Xavier Esparza 6/18/20238:50 AM

## 2022-06-14 ENCOUNTER — Inpatient Hospital Stay: Payer: Medicare Other

## 2022-06-14 DIAGNOSIS — A419 Sepsis, unspecified organism: Secondary | ICD-10-CM | POA: Diagnosis not present

## 2022-06-14 DIAGNOSIS — K8502 Idiopathic acute pancreatitis with infected necrosis: Secondary | ICD-10-CM | POA: Diagnosis not present

## 2022-06-14 DIAGNOSIS — N179 Acute kidney failure, unspecified: Secondary | ICD-10-CM | POA: Diagnosis not present

## 2022-06-14 LAB — COMPREHENSIVE METABOLIC PANEL
ALT: 74 U/L — ABNORMAL HIGH (ref 0–44)
AST: 75 U/L — ABNORMAL HIGH (ref 15–41)
Albumin: 2.7 g/dL — ABNORMAL LOW (ref 3.5–5.0)
Alkaline Phosphatase: 61 U/L (ref 38–126)
Anion gap: 14 (ref 5–15)
BUN: 88 mg/dL — ABNORMAL HIGH (ref 8–23)
CO2: 23 mmol/L (ref 22–32)
Calcium: 4.2 mg/dL — CL (ref 8.9–10.3)
Chloride: 98 mmol/L (ref 98–111)
Creatinine, Ser: 9.65 mg/dL — ABNORMAL HIGH (ref 0.61–1.24)
GFR, Estimated: 5 mL/min — ABNORMAL LOW (ref >60)
Glucose, Bld: 210 mg/dL — ABNORMAL HIGH (ref 70–99)
Potassium: 4.8 mmol/L (ref 3.5–5.1)
Sodium: 135 mmol/L (ref 135–145)
Total Bilirubin: 0.8 mg/dL (ref 0.3–1.2)
Total Protein: 5.3 g/dL — ABNORMAL LOW (ref 6.5–8.1)

## 2022-06-14 LAB — CBC WITH DIFFERENTIAL/PLATELET
Abs Immature Granulocytes: 0.39 10*3/uL — ABNORMAL HIGH (ref 0.00–0.07)
Basophils Absolute: 0.1 10*3/uL (ref 0.0–0.1)
Basophils Relative: 0 %
Eosinophils Absolute: 0.1 10*3/uL (ref 0.0–0.5)
Eosinophils Relative: 0 %
HCT: 27.6 % — ABNORMAL LOW (ref 39.0–52.0)
Hemoglobin: 9.2 g/dL — ABNORMAL LOW (ref 13.0–17.0)
Immature Granulocytes: 3 %
Lymphocytes Relative: 5 %
Lymphs Abs: 0.7 10*3/uL (ref 0.7–4.0)
MCH: 26.6 pg (ref 26.0–34.0)
MCHC: 33.3 g/dL (ref 30.0–36.0)
MCV: 79.8 fL — ABNORMAL LOW (ref 80.0–100.0)
Monocytes Absolute: 1 10*3/uL (ref 0.1–1.0)
Monocytes Relative: 7 %
Neutro Abs: 12.1 10*3/uL — ABNORMAL HIGH (ref 1.7–7.7)
Neutrophils Relative %: 85 %
Platelets: 141 10*3/uL — ABNORMAL LOW (ref 150–400)
RBC: 3.46 MIL/uL — ABNORMAL LOW (ref 4.22–5.81)
RDW: 16.4 % — ABNORMAL HIGH (ref 11.5–15.5)
Smear Review: NORMAL
WBC: 14.2 10*3/uL — ABNORMAL HIGH (ref 4.0–10.5)
nRBC: 0.4 % — ABNORMAL HIGH (ref 0.0–0.2)

## 2022-06-14 LAB — GLUCOSE, CAPILLARY
Glucose-Capillary: 134 mg/dL — ABNORMAL HIGH (ref 70–99)
Glucose-Capillary: 152 mg/dL — ABNORMAL HIGH (ref 70–99)
Glucose-Capillary: 155 mg/dL — ABNORMAL HIGH (ref 70–99)
Glucose-Capillary: 168 mg/dL — ABNORMAL HIGH (ref 70–99)
Glucose-Capillary: 169 mg/dL — ABNORMAL HIGH (ref 70–99)
Glucose-Capillary: 176 mg/dL — ABNORMAL HIGH (ref 70–99)
Glucose-Capillary: 189 mg/dL — ABNORMAL HIGH (ref 70–99)
Glucose-Capillary: 196 mg/dL — ABNORMAL HIGH (ref 70–99)

## 2022-06-14 LAB — MAGNESIUM: Magnesium: 2.2 mg/dL (ref 1.7–2.4)

## 2022-06-14 LAB — PHOSPHORUS: Phosphorus: 6.8 mg/dL — ABNORMAL HIGH (ref 2.5–4.6)

## 2022-06-14 IMAGING — DX DG ABDOMEN 1V
1 series · 1 of 1 positions shown · non-contrast
Comparison: X-ray abdomen [DATE] [DATE] p.m.

CLINICAL DATA: [2E].  NG tube placement

EXAM:
ABDOMEN - 1 VIEW

[abdomen supine]
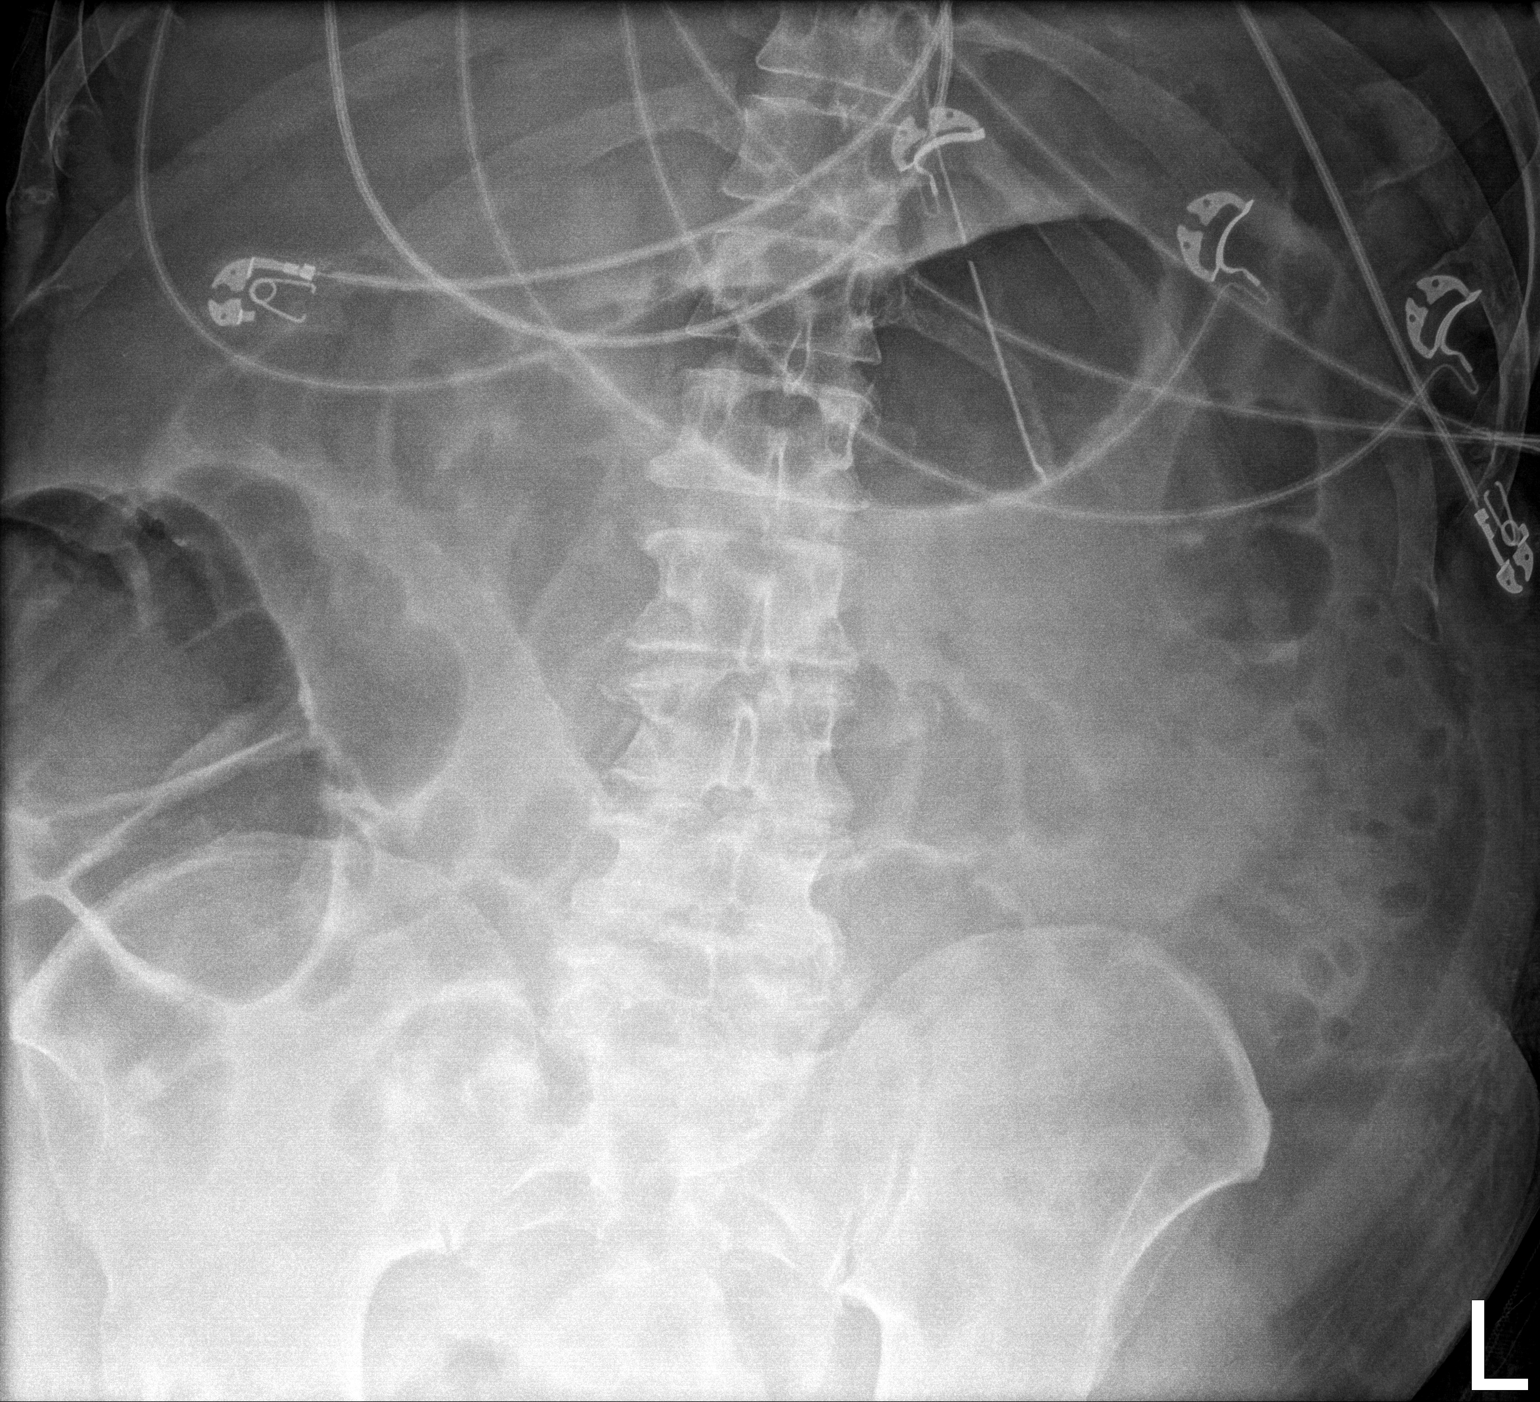

[1 of 1 positions shown; findings below may reference images not displayed]

FINDINGS: Interval removal of an enteric tube. Interval placement of a new
enteric tube with tip and side port overlying the gastric lumen.
Persistent gaseous dilatation of the small and large bowel. Colonic
diverticulosis. No radio-opaque calculi or other significant
radiographic abnormality are seen.
IMPRESSION: 1. Interval removal of an enteric tube. Interval placement of a new
enteric tube with tip and side port overlying the gastric lumen.
2. Postop ileus.

## 2022-06-14 IMAGING — US US ABDOMEN LIMITED
1 series · 9 of 9 positions shown · non-contrast
Comparison: None Available.

CLINICAL DATA: Evaluate for ascites

EXAM:
LIMITED ABDOMEN ULTRASOUND FOR ASCITES
TECHNIQUE: Limited ultrasound survey for ascites was performed in all four
abdominal quadrants.

[Series 1: us abdomen limited · 9 of 9 slices shown]
[im 1/9]
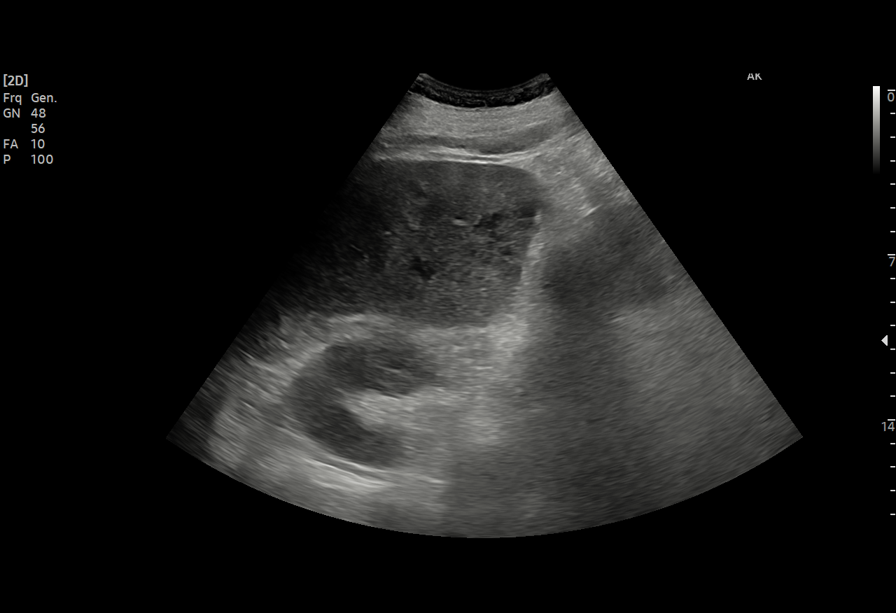
[im 2/9]
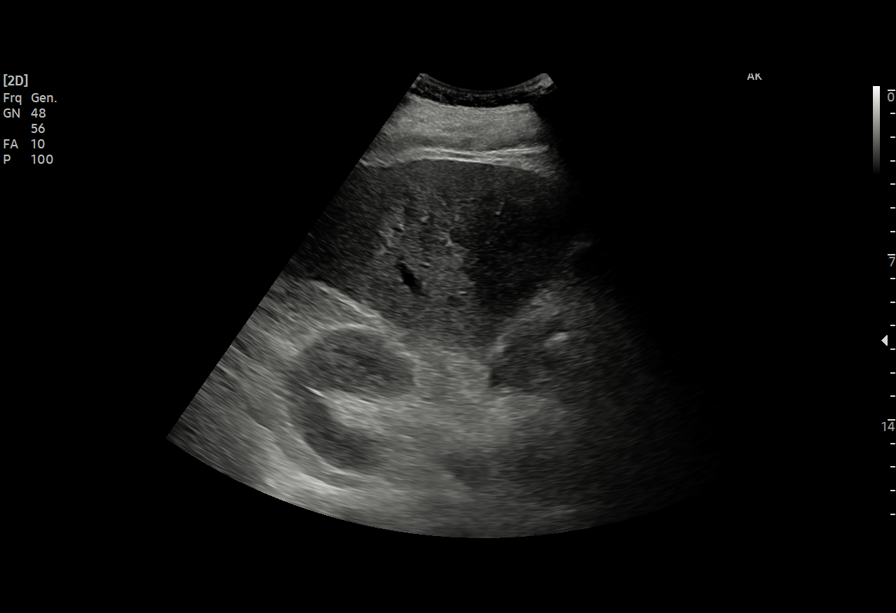
[im 3/9]
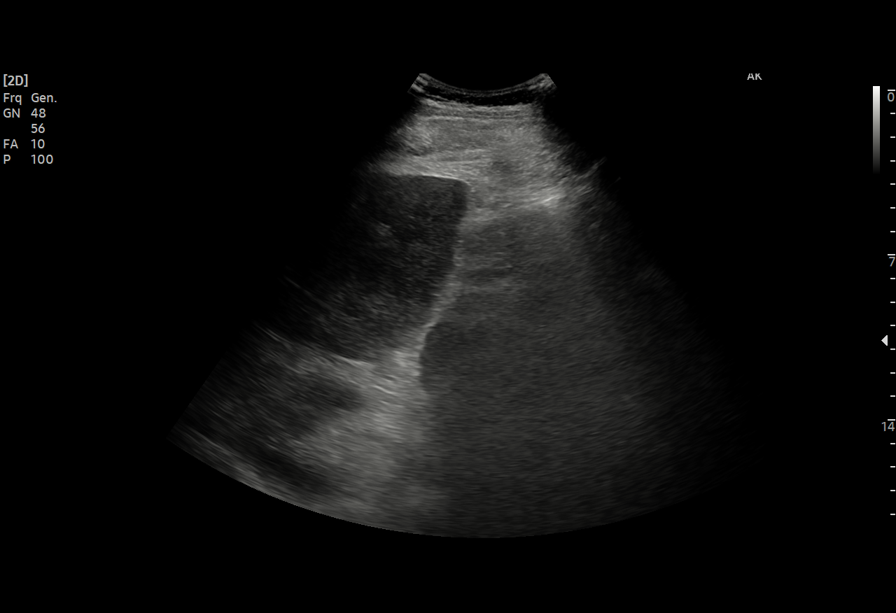
[im 4/9]
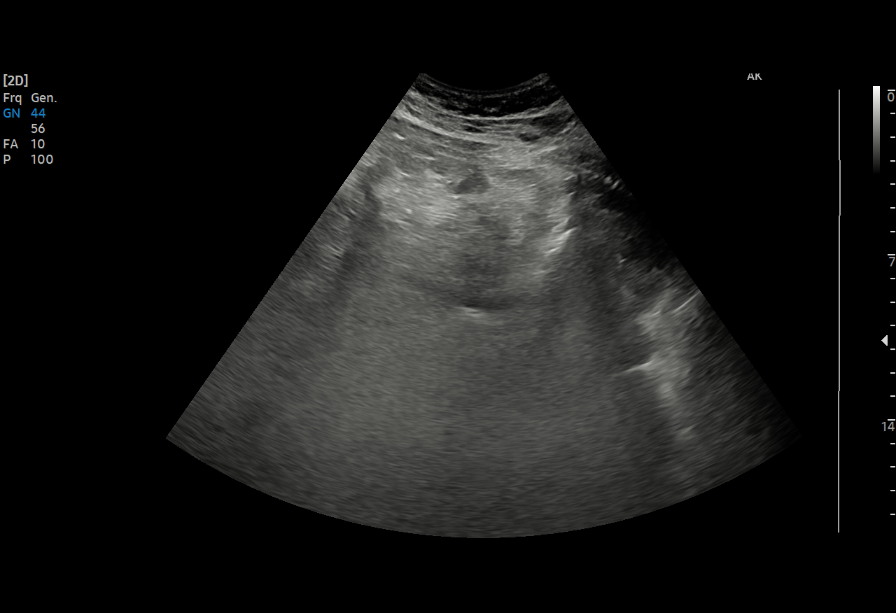
[im 5/9]
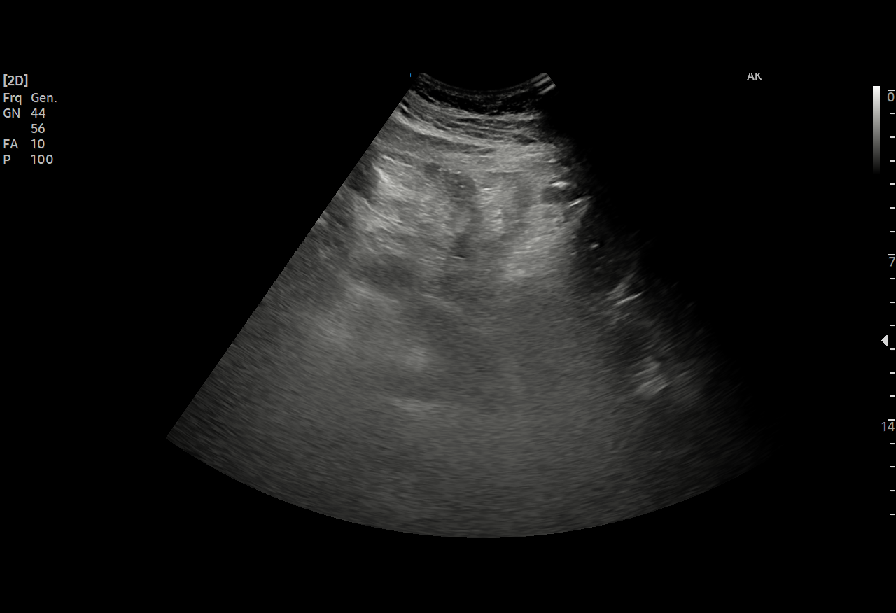
[im 6/9]
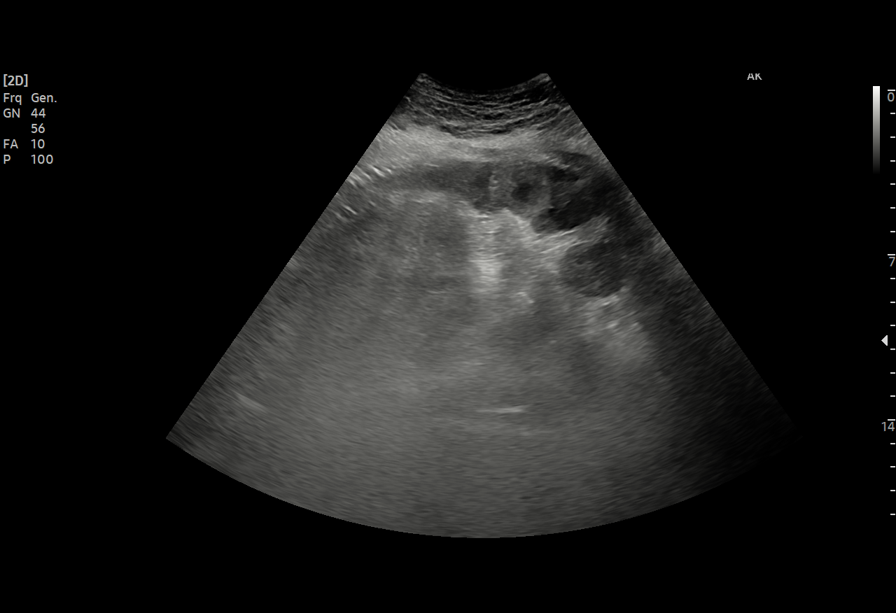
[im 7/9]
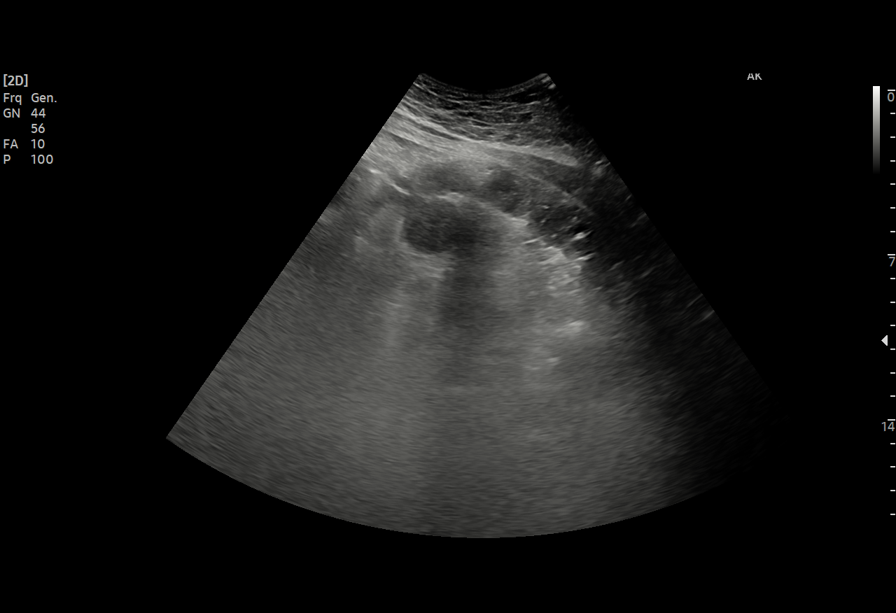
[im 8/9]
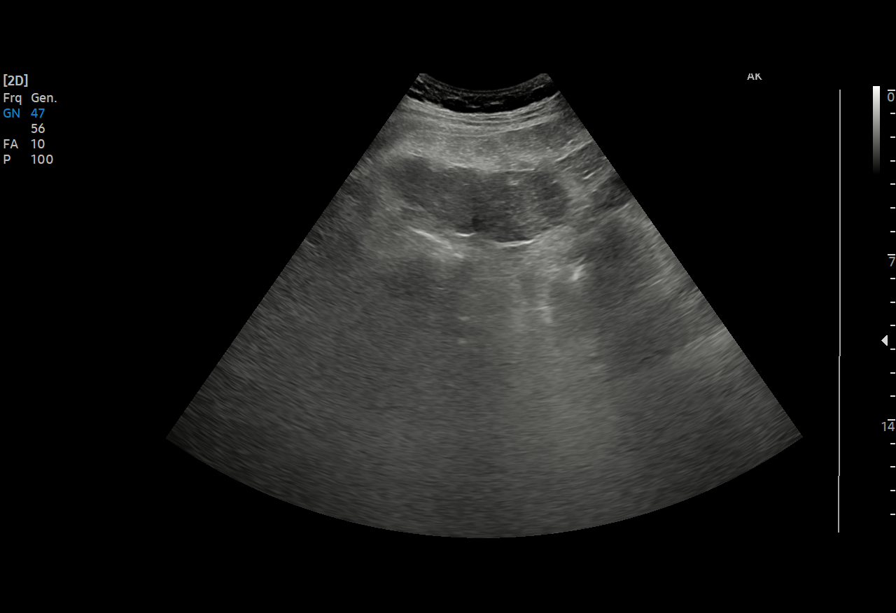
[im 9/9]
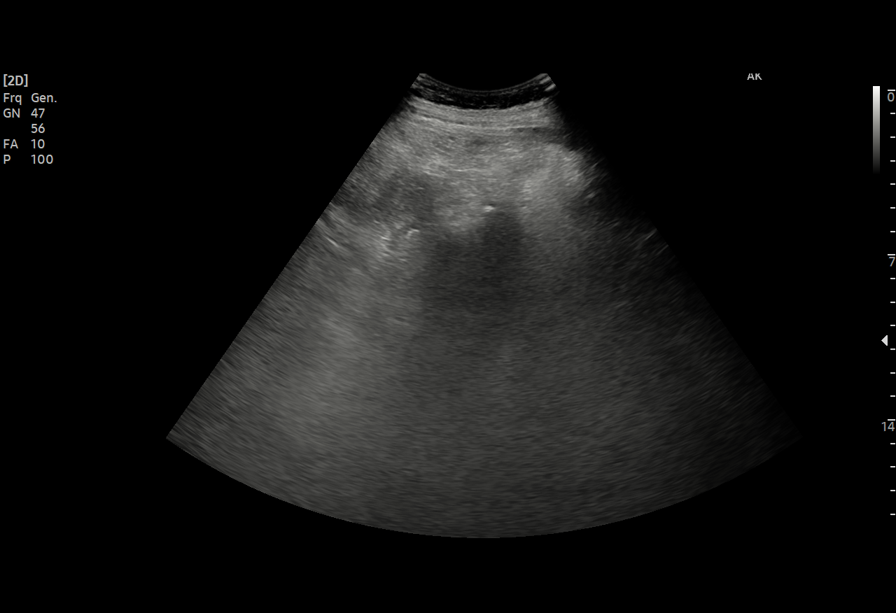

[9 of 9 positions shown; findings below may reference images not displayed]

FINDINGS: There is no sonographic evidence of ascites in all 4 quadrants of
abdomen.
IMPRESSION: There is no demonstrable ascites.

## 2022-06-14 IMAGING — DX DG ABDOMEN 1V
3 series · 3 of 3 positions shown · non-contrast
Comparison: None Available.

CLINICAL DATA: Ileus

EXAM:
ABDOMEN - 1 VIEW

[abdomen supine (1 of 3)]
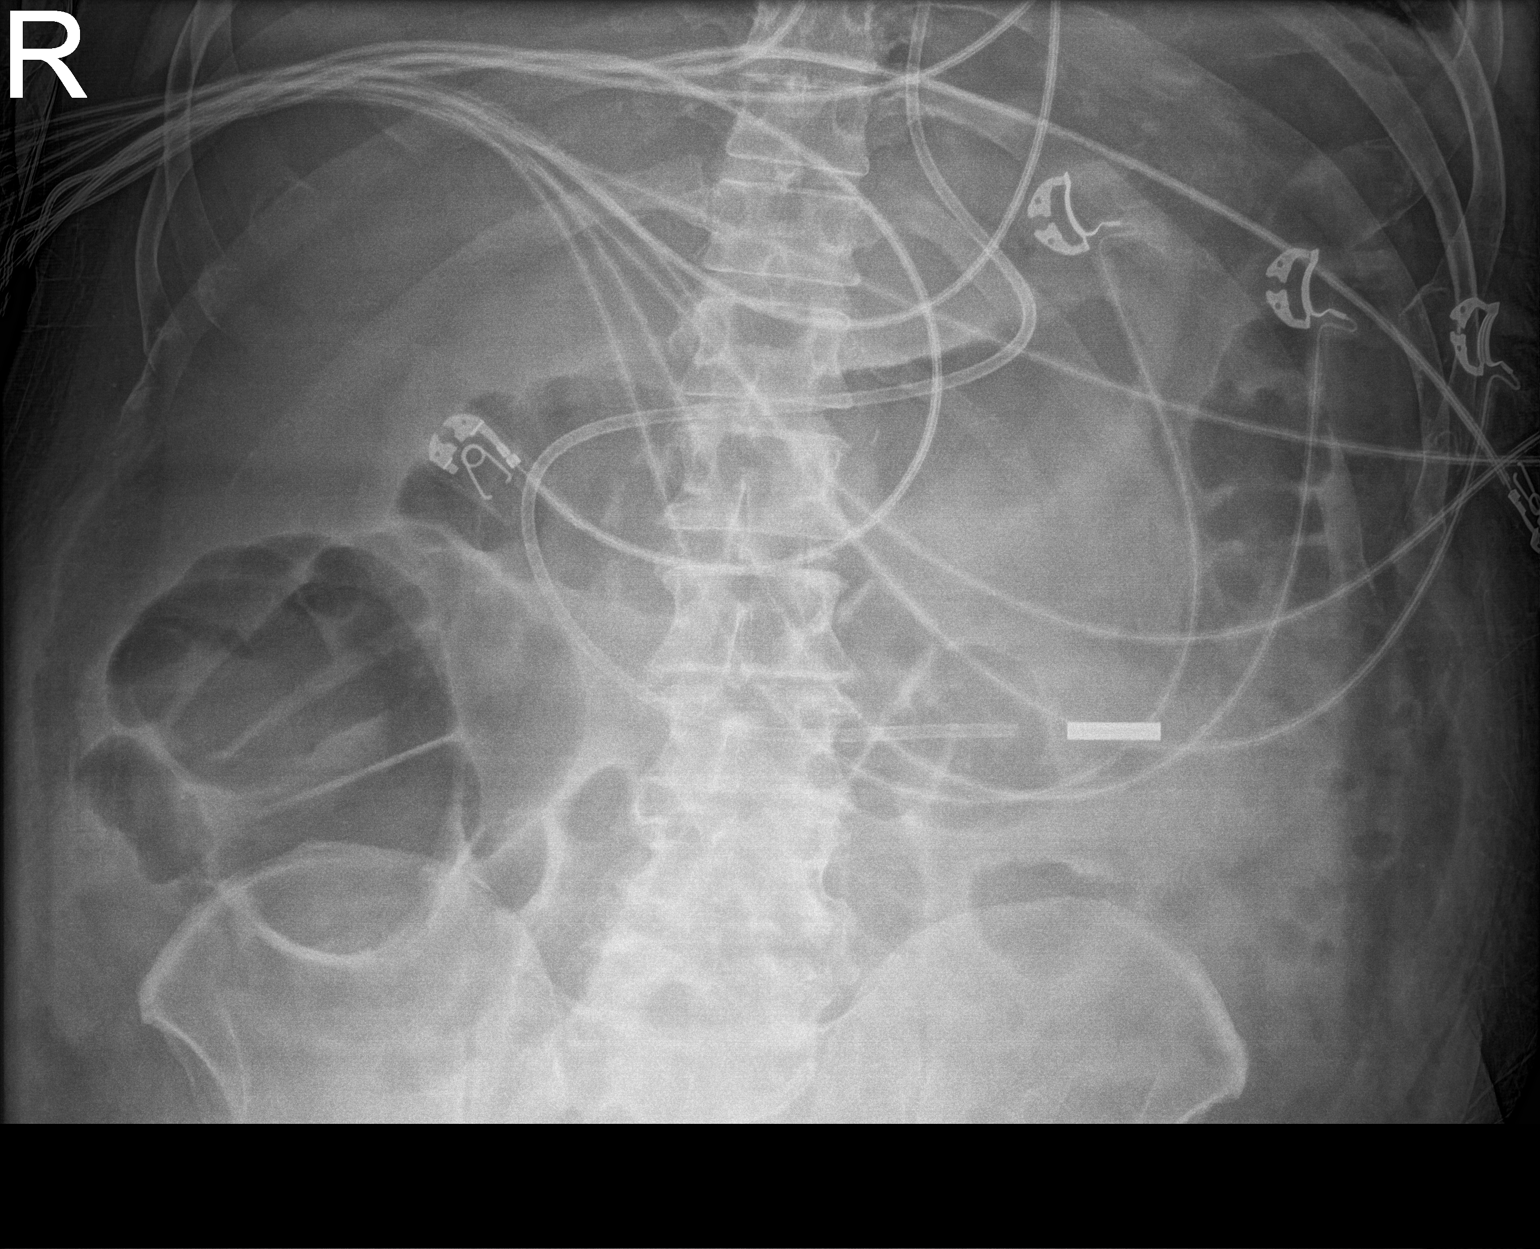

[abdomen supine (2 of 3)]
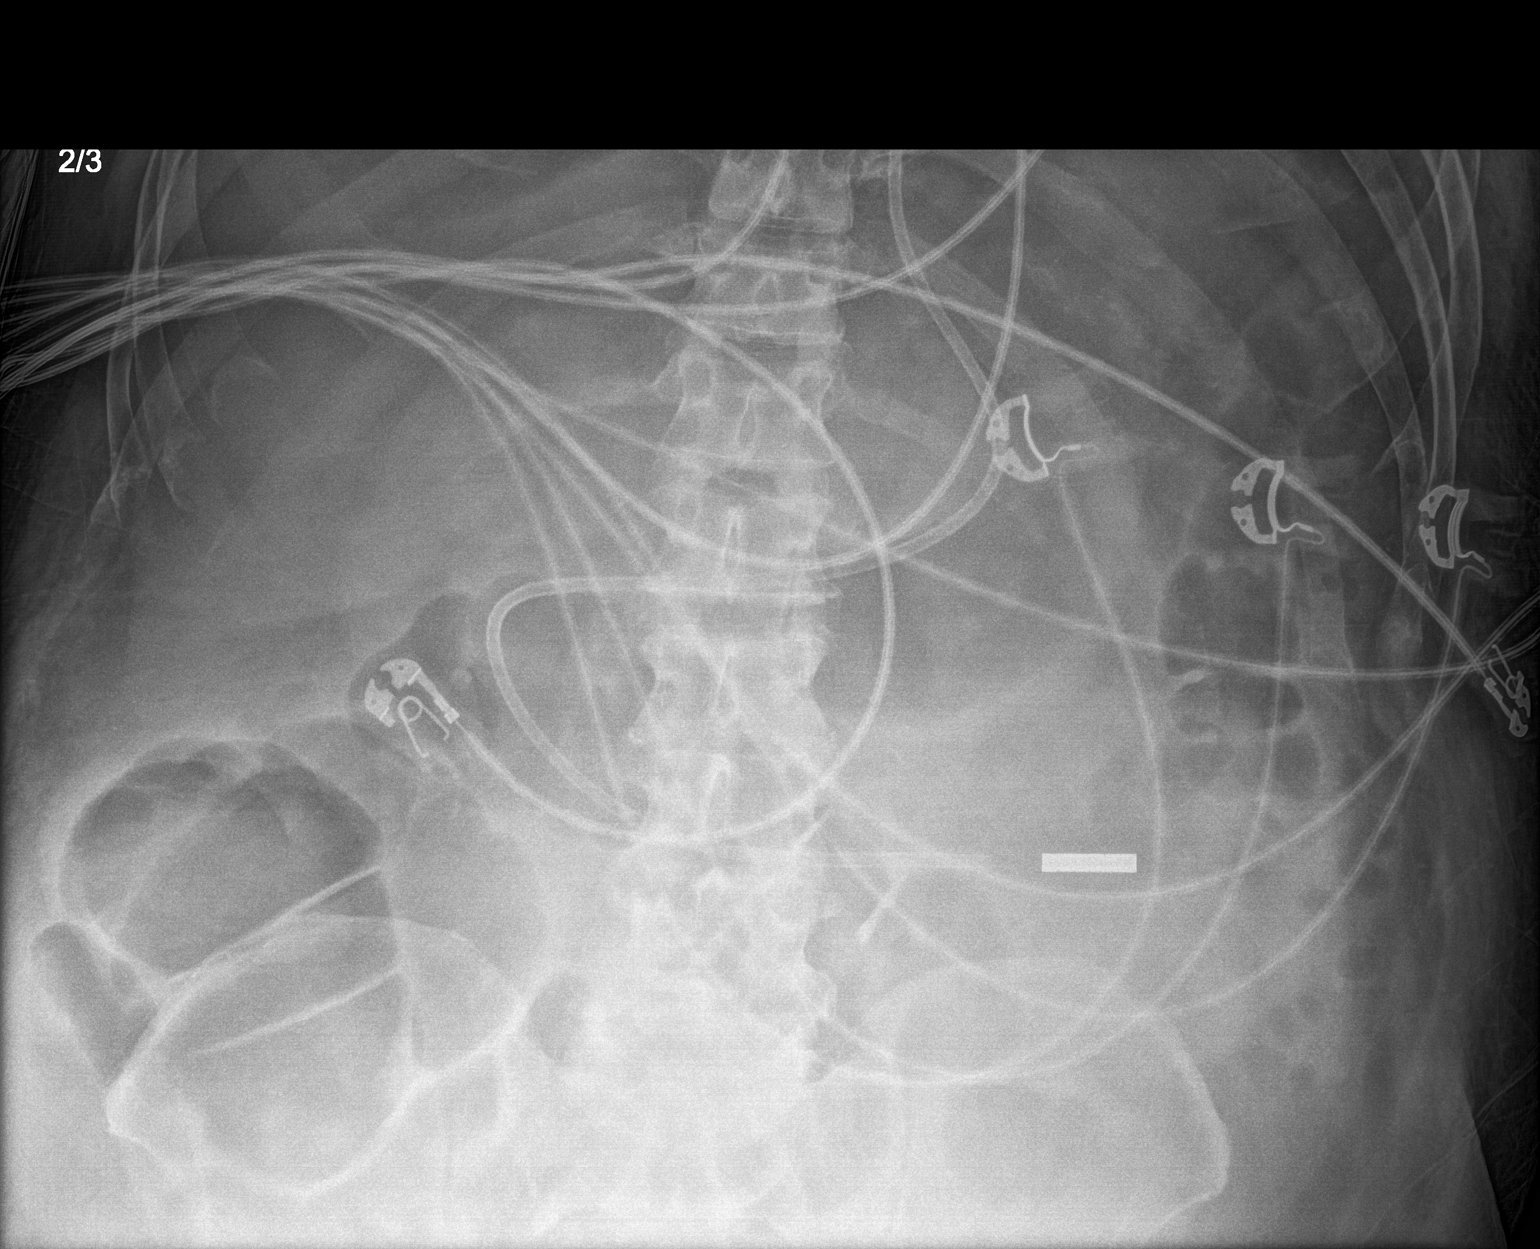

[abdomen supine (3 of 3)]
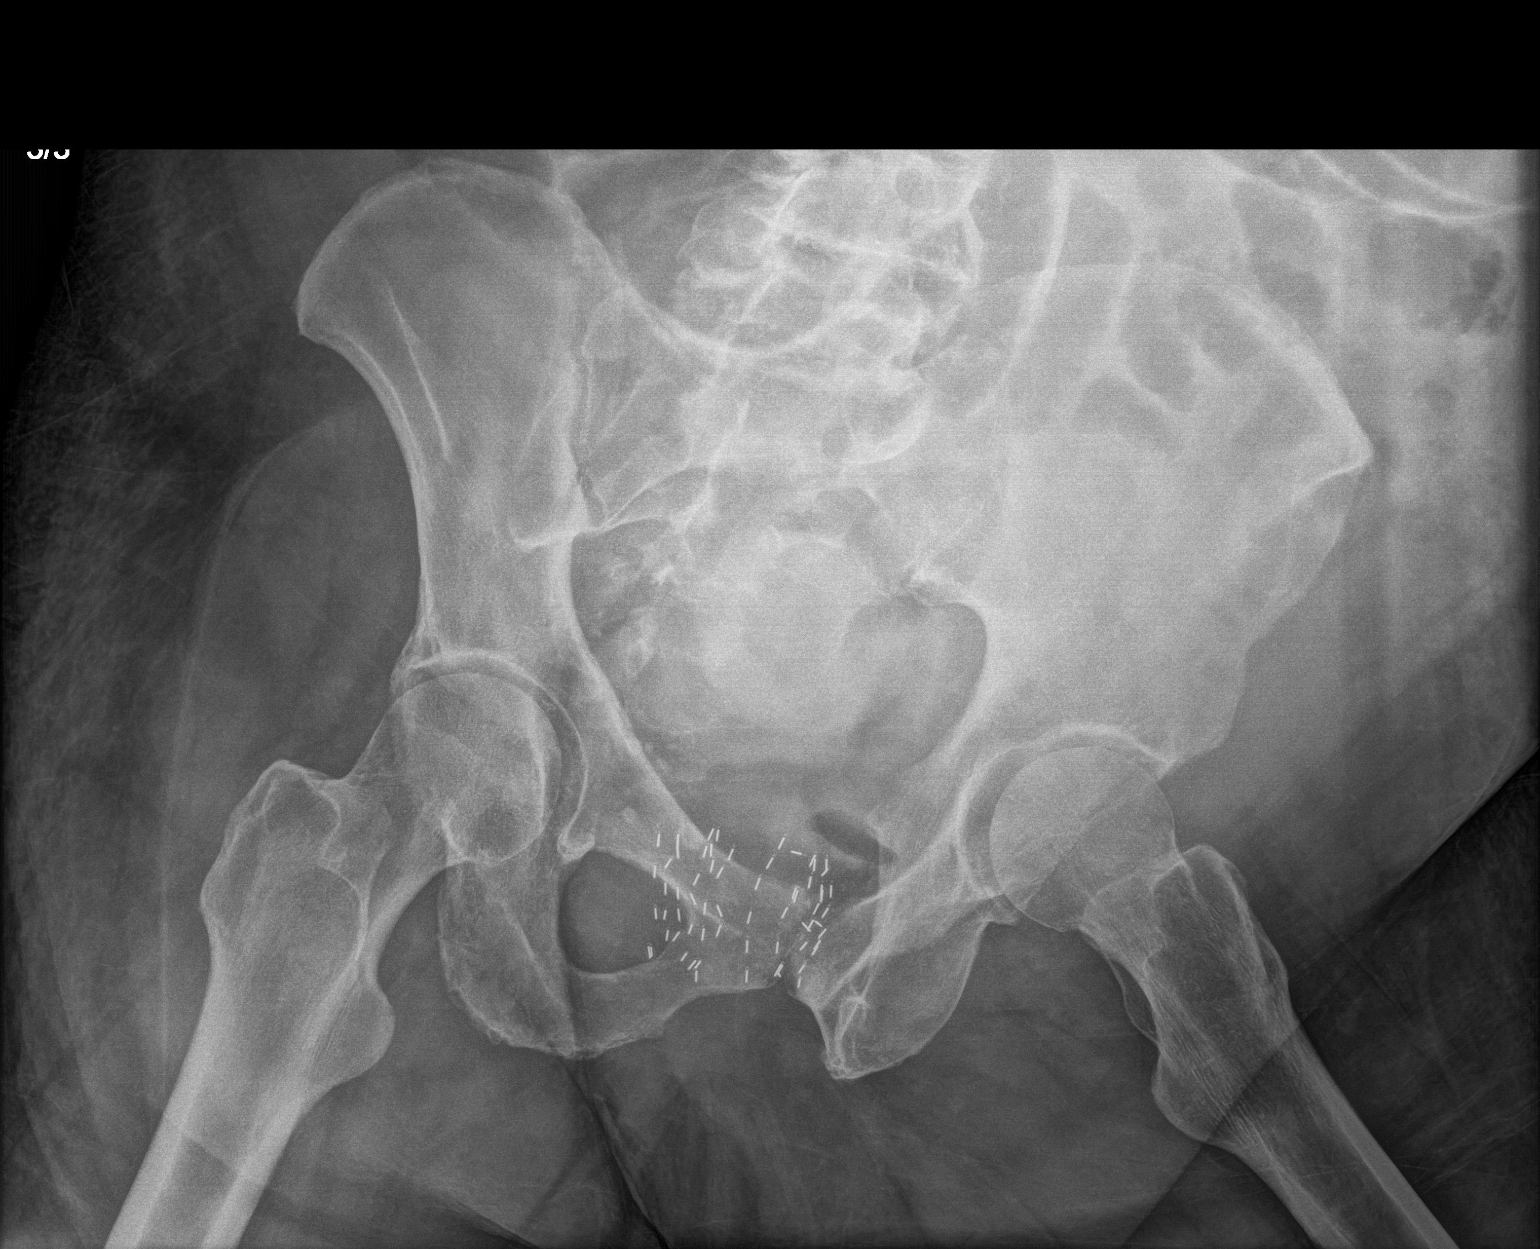

[3 of 3 positions shown; findings below may reference images not displayed]

FINDINGS: Feeding tube tip overlies the fourth portion of the duodenum. There
is diffuse mild gaseous distension of bowel in a nonobstructive
pattern.
IMPRESSION: Mild diffuse gaseous distension foot of bowel, compatible with
ileus.

## 2022-06-14 MED ORDER — IPRATROPIUM-ALBUTEROL 0.5-2.5 (3) MG/3ML IN SOLN: 3.0000 mL | RESPIRATORY_TRACT | Status: DC | PRN

## 2022-06-14 MED ORDER — SENNOSIDES-DOCUSATE SODIUM 8.6-50 MG PO TABS
1.0000 | ORAL_TABLET | Freq: Two times a day (BID) | ORAL | Status: DC
  Administered 2022-06-14: 1 via ORAL
  Filled 2022-06-14: qty 1

## 2022-06-14 MED ORDER — SENNOSIDES 8.8 MG/5ML PO SYRP
5.0000 mL | ORAL_SOLUTION | Freq: Two times a day (BID) | ORAL | Status: DC
Start: 2022-06-14 — End: 2022-06-18
  Filled 2022-06-14 (×9): qty 5

## 2022-06-14 MED ORDER — CALCIUM GLUCONATE-NACL 2-0.675 GM/100ML-% IV SOLN
2.0000 g | Freq: Once | INTRAVENOUS | Status: AC
  Administered 2022-06-14: 2000 mg via INTRAVENOUS
  Filled 2022-06-14: qty 100

## 2022-06-14 MED ORDER — POLYETHYLENE GLYCOL 3350 17 G PO PACK
17.0000 g | PACK | Freq: Every day | ORAL | Status: DC
  Administered 2022-06-14: 17 g via ORAL
  Filled 2022-06-14: qty 1

## 2022-06-14 MED ORDER — BISACODYL 10 MG RE SUPP: 10.0000 mg | Freq: Every day | RECTAL | Status: DC | PRN

## 2022-06-14 MED ORDER — POLYETHYLENE GLYCOL 3350 17 G PO PACK
17.0000 g | PACK | Freq: Every day | ORAL | Status: DC
  Administered 2022-06-19: 17 g
  Filled 2022-06-14: qty 1

## 2022-06-14 MED ORDER — FUROSEMIDE 10 MG/ML IJ SOLN
120.0000 mg | Freq: Once | INTRAVENOUS | Status: AC
  Administered 2022-06-14: 120 mg via INTRAVENOUS
  Filled 2022-06-14: qty 12

## 2022-06-14 MED ORDER — ORAL CARE MOUTH RINSE
15.0000 mL | OROMUCOSAL | Status: DC | PRN
  Administered 2022-06-14 – 2022-06-25 (×4): 15 mL via OROMUCOSAL

## 2022-06-14 MED ORDER — HEPARIN SODIUM (PORCINE) 1000 UNIT/ML IJ SOLN
INTRAMUSCULAR | Status: AC
  Administered 2022-06-14: 1000 [IU] via INTRAVENOUS_CENTRAL
  Filled 2022-06-14: qty 10

## 2022-06-14 MED ORDER — HEPARIN SODIUM (PORCINE) 5000 UNIT/ML IJ SOLN
5000.0000 [IU] | Freq: Three times a day (TID) | INTRAMUSCULAR | Status: DC
  Administered 2022-06-14 – 2022-06-16 (×6): 5000 [IU] via SUBCUTANEOUS
  Filled 2022-06-14 (×5): qty 1

## 2022-06-14 MED ORDER — ORAL CARE MOUTH RINSE
15.0000 mL | OROMUCOSAL | Status: DC
  Administered 2022-06-14 (×4): 15 mL via OROMUCOSAL

## 2022-06-14 MED ORDER — FUROSEMIDE 10 MG/ML IJ SOLN: 80.0000 mg | Freq: Once | INTRAMUSCULAR | Status: DC

## 2022-06-14 NOTE — Progress Notes (Signed)
Date of Admission:  06/08/2022    ID: Xavier Esparza is a 76 y.o. male Principal Problem:   Severe sepsis ([place]) Active Problems:   Diabetes mellitus without complication (HCC)   BPH (benign prostatic hyperplasia)   Nephrolithiasis   Acute pancreatitis   Liver mass, right lobe, abscess vs mass   Portal vein thrombosis   AKI (acute kidney injury) ([place])   Syncope   History of prostate cancer   HTN (hypertension)   Necrotizing pancreatitis    Subjective: Pt still in pain But better than before Has NG tube  Medications:   Chlorhexidine Gluconate Cloth  6 each Topical Q0600   feeding supplement (PROSource TF)  45 mL Per Tube BID   free water  30 mL Per Tube Q4H   insulin aspart  0-15 Units Subcutaneous Q4H   multivitamin  1 tablet Per Tube QHS   mouth rinse  15 mL Mouth Rinse 4 times per day   polyethylene glycol  17 g Oral Daily   QUEtiapine  100 mg Per Tube QHS   senna-docusate  1 tablet Oral BID    Objective: Vital signs in last 24 hours: Temp:  [98.6 F (37 C)-99.5 F (37.5 C)] 99.5 F (37.5 C) (06/19 1000) Pulse Rate:  [29-115] 53 (06/19 1330) Resp:  [22-39] 32 (06/19 1330) BP: (101-145)/(53-93) 126/71 (06/19 1315) SpO2:  [88 %-100 %] 89 % (06/19 1330) Weight:  [105.7 kg] 105.7 kg (06/19 1020) Patient Vitals for the past 24 hrs:  BP Temp Temp src Pulse Resp SpO2 Weight  06/14/22 1330 -- -- -- (!) 53 (!) 32 (!) 89 % --  06/14/22 1315 126/71 -- -- (!) 115 (!) 39 95 % --  06/14/22 1300 106/73 -- -- (!) 112 (!) 36 98 % --  06/14/22 1245 101/72 -- -- (!) 109 (!) 31 98 % --  06/14/22 1230 125/79 -- -- (!) 109 (!) 35 98 % --  06/14/22 1215 -- -- -- (!) 108 (!) 28 100 % --  06/14/22 1212 -- -- -- (!) 107 (!) 28 97 % --  06/14/22 1200 114/74 -- -- (!) 109 (!) 33 96 % --  06/14/22 1145 109/74 -- -- (!) 106 (!) 33 97 % --  06/14/22 1130 104/74 -- -- (!) 104 (!) 32 95 % --  06/14/22 1115 -- -- -- (!) 106 (!) 35 95 % --  06/14/22 1100 101/66 -- -- (!) 29 (!) 29 98 % --   06/14/22 1045 107/66 -- -- (!) 101 (!) 30 95 % --  06/14/22 1028 129/68 -- -- 99 (!) 30 97 % --  06/14/22 1024 -- -- -- 99 (!) 37 95 % --  06/14/22 1020 -- -- -- -- -- -- 105.7 kg  06/14/22 1000 -- 99.5 F (37.5 C) Oral -- -- -- --  06/14/22 0900 131/77 -- -- (!) 103 (!) 31 92 % --  06/14/22 0845 -- -- -- 99 (!) 37 94 % --  06/14/22 0830 -- -- -- (!) 101 (!) 32 96 % --  06/14/22 0815 -- -- -- (!) 102 (!) 31 96 % --  06/14/22 0800 (!) 121/93 99.5 F (37.5 C) Oral (!) 106 (!) 24 98 % --  06/14/22 0745 -- -- -- (!) 103 (!) 36 96 % --  06/14/22 0730 -- -- -- (!) 104 (!) 39 97 % --  06/14/22 0715 -- -- -- (!) 105 (!) 34 97 % --  06/14/22 0700 (!) 142/71 -- -- [name]  108 (!) 36 97 % --  06/14/22 0640 -- -- -- (!) 105 (!) 37 (!) 88 % --  06/14/22 0600 139/75 -- -- 100 (!) 35 93 % --  06/14/22 0500 119/69 -- -- 96 (!) 30 91 % 105.7 kg  06/14/22 0400 118/67 99.1 F (37.3 C) Oral 96 (!) 33 92 % --  06/14/22 0300 117/66 -- -- 100 (!) 30 94 % --  06/14/22 0200 123/68 -- -- 100 (!) 24 93 % --  06/14/22 0100 133/72 -- -- (!) 102 (!) 29 94 % --  06/14/22 0030 115/64 99.4 F (37.4 C) Oral 96 (!) 30 90 % --  06/14/22 0000 118/69 -- -- 98 (!) 26 93 % --  06/13/22 2345 124/75 -- -- (!) 103 (!) 28 90 % --  06/13/22 2330 113/64 -- -- (!) 103 (!) 34 92 % --  06/13/22 2315 124/70 -- -- (!) 103 (!) 35 96 % --  06/13/22 2300 120/73 -- -- (!) 103 (!) 34 92 % --  06/13/22 2245 117/69 -- -- (!) 101 (!) 33 94 % --  06/13/22 2230 105/66 -- -- 99 (!) 32 93 % --  06/13/22 2215 108/68 -- -- 100 (!) 28 93 % --  06/13/22 2200 116/61 -- -- 91 (!) 30 96 % --  06/13/22 2145 125/71 -- -- 93 (!) 25 93 % --  06/13/22 2130 136/64 -- -- 94 (!) 32 95 % --  06/13/22 2115 129/72 -- -- 92 (!) 32 94 % --  06/13/22 2100 130/76 -- -- 87 (!) 24 94 % --  06/13/22 2045 119/89 -- -- 93 (!) 25 95 % --  06/13/22 2030 111/72 98.9 F (37.2 C) Oral 94 (!) 22 95 % --  06/13/22 2015 123/73 -- -- 95 (!) 31 96 % --  06/13/22 2000 136/69  -- -- 96 (!) 29 97 % --  06/13/22 1945 130/73 -- -- 95 (!) 27 94 % --  06/13/22 1930 122/72 -- -- 93 (!) 23 95 % --  06/13/22 1915 118/68 -- -- 97 (!) 28 93 % --  06/13/22 1900 106/70 -- -- 100 (!) 31 95 % --  06/13/22 1830 (!) 106/91 -- -- 100 (!) 26 95 % --  06/13/22 1745 116/76 -- -- 96 (!) 24 95 % --  06/13/22 1730 132/77 -- -- (!) 101 (!) 30 97 % --  06/13/22 1715 125/69 -- -- 100 (!) 28 95 % --  06/13/22 1700 (!) 145/74 -- -- -- (!) 37 -- --  06/13/22 1645 121/84 -- -- (!) 102 (!) 27 95 % --  06/13/22 1630 118/68 -- -- (!) 101 (!) 25 95 % --  06/13/22 1615 121/72 -- -- (!) 106 (!) 27 95 % --  06/13/22 1600 114/80 98.6 F (37 C) Oral 92 (!) 30 95 % --  06/13/22 1545 122/66 -- -- (!) 101 (!) 25 93 % --  06/13/22 1530 112/68 -- -- (!) 101 (!) 26 96 % --  06/13/22 1515 129/70 -- -- 99 (!) 25 95 % --  06/13/22 1500 (!) 128/53 -- -- -- (!) 37 -- --  06/13/22 1400 139/83 -- -- 98 (!) 22 94 % --     PHYSICAL EXAM:  General: awake , responding to questions appropriately-not confused Lungs: b/l air entry, decreased bases Heart: tachycardia Abdomen: Soft, distended epigastrium tender Extremities: atraumatic, no cyanosis. No edema. No clubbing Skin: No rashes or lesions. Or bruising Lymph: Cervical, supraclavicular normal. Neurologic: moves  all extremities   Lab Results Recent Labs    06/13/22 0450 06/14/22 0423  WBC 12.7* 14.2*  HGB 10.6* 9.2*  HCT 31.6* 27.6*  NA 136 135  K 4.3 4.8  CL 99 98  CO2 24 23  BUN 83* 88*  CREATININE 7.54* 9.65*   Liver Panel Recent Labs    06/13/22 0450 06/14/22 0423  PROT 5.5* 5.3*  ALBUMIN 2.3* 2.7*  AST 106* 75*  ALT 105* 74*  ALKPHOS 62 61  BILITOT 0.7 0.8     Microbiology: 6.13/23 bc - NG Studies/Results: [place] ASCITES (ABDOMEN LIMITED)  Result Date: 06/14/2022 CLINICAL DATA:  Evaluate for ascites EXAM: LIMITED ABDOMEN ULTRASOUND FOR ASCITES TECHNIQUE: Limited ultrasound survey for ascites was performed in all four abdominal  quadrants. COMPARISON:  None Available. FINDINGS: There is no sonographic evidence of ascites in all 4 quadrants of abdomen. IMPRESSION: There is no demonstrable ascites. Electronically Signed   By: Elmer Picker M.D.   On: 06/14/2022 08:57   DG Chest Port 1 View  Result Date: 06/14/2022 CLINICAL DATA:  Shortness of breath. EXAM: PORTABLE CHEST 1 VIEW COMPARISON:  June 11, 2022. FINDINGS: Stable cardiomediastinal silhouette. Feeding tube is seen entering stomach. Hypoinflation of the lungs is noted with mild bibasilar subsegmental atelectasis. Minimal left pleural effusion is noted. Bony thorax is unremarkable. IMPRESSION: Hypoinflation of the lungs is noted with mild bibasilar subsegmental atelectasis. Minimal left pleural effusion. Electronically Signed   By: Marijo Conception M.D.   On: 06/14/2022 08:11     Assessment/Plan: Impression/Recommendation ?76 yr male who was having hematuria for a few weeks and was followed by urologist and was supposed to have systoscopy in preparation of which he was given levauin- He took 2 doses of levauin within 12 hrs and had syncopal episode and dizziness after that Later developed abdominal pain in the ED  found to have acute  necrotizing pancreas Is this a allergic reaction/idisyncratic reaction to Levaquin? Or is it just coincidental ??   Necrotizing pancreatitis - unclear etiology  ? Levaquin ? Gall stones ( none seen in [place]) No alcohol No other new drugs   This pancreatitis is quick in progression and he has portal vein thrombosis and liver leison around the PV suggestive of liver inflammation, phlegmon but not a well defined abscess On  zosyn but sudden increase in leucocytosis- need to look at the pancreas to see for any abscess formation Ne fever i  AKI- combination of acute pancreatitis, ATN ???contrast induced nephropathy ?? rhabdo   Encephalopathy- multifactorial- HE was alert on admission--AKI and opioids are contributing to it Toxic  encephalopathy from pancreatitis Much better now       Abnormal LFTS- improved a lot combination of  shock liver ? Ascending cholangitis ? pancreatitis      Hypocalcemia from acute pancreatitis   EKG- RBBB-prolonged PR   Hematuria  H/o prostate ca      Discussed the management with care team

## 2022-06-14 NOTE — Progress Notes (Signed)
Hemodialysis Post Treatment Note   Date: 06/14/22   Access: [facility] CVC   UF Removed:  1502 ml  Next Scheduled Treatment: 06/16/22   Note: Patient started treatment as ordered. Patient tolerated treatment with no adverse effects. Patient hemodynamically stable, report given Janalyn Harder, RN.

## 2022-06-14 NOTE — TOC Initial Note (Signed)
Transition of Care [facility]) - Initial/Assessment Note    Patient Details  Name: Xavier Esparza MRN: [id number] Date of Birth: February 08, 1946  [facility] of [facility]) CM/SW Contact:    Donnelly Angelica, LCSW Phone Number: [phone] 06/14/2022, 11:36 AM  Clinical Narrative:                 CSW completed readmission screen with pt's wife, Xavier Esparza. Ms. Portman reports that pt's PCP is Elsie Stain. Pt does not have any issues getting his prescriptions. [facility] [address] [facility] [place], [place]. Pt does not utilize any equipment to get around and typically very independent. Pt usually drives himself to and from his appointments. Upon hospital discharge, Mrs. Ulbrich will transport her husband home.    Expected Discharge Plan: Home/Self Care Barriers to Discharge: Continued Medical Work up   Patient Goals and CMS Choice        Expected Discharge Plan and Services Expected Discharge Plan: Home/Self Care         Expected Discharge Date: 06/09/22                                    Prior Living Arrangements/Services   Lives with:: Self, Spouse Patient language and need for interpreter reviewed:: Yes Do you feel safe going back to the place where you live?: Yes      Need for Family Participation in Patient Care: Yes (Comment) Care giver support system in place?: Yes (comment)   Criminal Activity/Legal Involvement Pertinent to Current Situation/Hospitalization: No - Comment as needed  Activities of Daily Living      Permission Sought/Granted                  Emotional Assessment       Orientation: : Oriented to Self, Oriented to Place, Oriented to  Time, Oriented to Situation Alcohol / Substance Use: Never Used Psych Involvement: No (comment)  Admission diagnosis:  Portal vein thrombosis [I81] Lactic acidosis [E87.20] Necrotizing pancreatitis [K85.91] Liver mass [R16.0] Syncope, unspecified syncope type [R55] Leukocytosis,  unspecified type [D72.829] Acute pancreatitis, unspecified complication status, unspecified pancreatitis type [K85.90] Patient Active Problem List   Diagnosis Date Noted   Necrotizing pancreatitis 06/10/2022   Hematuria    Acute pancreatitis    Liver mass, right lobe, abscess vs mass    Portal vein thrombosis    AKI (acute kidney injury) ([place])    Severe sepsis ([place])    Syncope    History of prostate cancer    HTN (hypertension)    Sore throat 03/19/2022   Strep pharyngitis 03/19/2022   Elevated BP without diagnosis of hypertension 12/29/2021   Bradycardia 07/08/2021   Nephrolithiasis 01/16/2021   Hyperglycemia 12/07/2020   Seborrheic keratosis 06/05/2020   Cough 08/28/2018   Health care maintenance 05/17/2018   Advance care planning 05/17/2018   Medicare annual wellness visit, initial 05/11/2018   Special screening for malignant neoplasms, colon 02/14/2018   Microalbuminuria 04/26/2017   Prostate cancer ([place]) 04/26/2017   Vegetarian diet 04/26/2017   BPH (benign prostatic hyperplasia) 01/07/2016   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   Multinodular goiter 11/30/2011   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   Preventative health care 09/19/2011   HYPERCHOLESTEROLEMIA 07/03/2009   Diabetes mellitus without complication ([place]) 58/52/7782   ERECTILE DYSFUNCTION, 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   PCP:  Tonia Ghent, MD Pharmacy:   [facility] -  [address] [place] [address] Phone: [phone] Fax: [phone]  [facility] [place] [facility] [address] [place] [place] [address] Phone: [phone] Fax: [phone]  [id number] [address]Phone: [phone]: [phone] [address]Phone: [phone]: [phone]    Social Determinants of Health (SDOH) Interventions    Readmission Risk Interventions    06/14/2022   11:33 AM  Readmission Risk Prevention Plan  PCP or Specialist Appt within 3-5 Days Complete  HRI or [place] West[place]  Social Work Consult for [place]anning/Counseling Complete  Palliative Care Screening Complete  Medication Review (Press photographer Complete

## 2022-06-14 NOTE — Progress Notes (Addendum)
PT Cancellation Note  Patient Details Name: Xavier Esparza MRN: [id number] DOB: 11-28-1946   Cancelled Treatment:     Patient at procedure/test: Patient currently about to get hemodialysis. PT to follow up as appropriate.  Minna Merritts, PT, MPT  Xavier Esparza 06/14/2022, 1:51 PM

## 2022-06-14 NOTE — Progress Notes (Signed)
Nutrition Follow Up Note   DOCUMENTATION CODES:   Obesity unspecified  INTERVENTION:   Once appropriate for tube feeds, recommend:  Vital 1.5'@20ml' /hr, once bowel function returns, recommend increase by 20m/hr q 8 hours until goal rate of 653mhr is reached.   Pro-Source 4538mID via tube  Free water flushes 55m32m hours to maintain tube patency   Regimen at goal rate provides 2420kcal/day, 127g/day protein and 1371ml69m of free water   Pt at high refeed risk; recommend monitor potassium, magnesium and phosphorus labs daily until stable  Rena-vit daily via tube   NUTRITION DIAGNOSIS:   Inadequate oral intake related to acute illness as evidenced by NPO status.  GOAL:   Patient will meet greater than or equal to 90% of their needs -met   MONITOR:   Diet advancement, Labs, Weight trends, TF tolerance, Skin, I & O's  ASSESSMENT:   76 y/73male with h/o DM, BPH, prostate cancer, hiatal hernia, kidney stones and HTN who is admitted with severe necrotizing pancreatitis, portal vein thrombosis, liver lesions, AKI and sepsis.  -Pt s/p fluoroscopy guided post pyloric NGT 6/16 - Pt s/p initiation of HD 6/16  Met with pt in room today. Pt sitting up in bed today and is having shortness of breath. Tube feeds held this morning as pt with increasing abdominal distension and little bowel movements. KUB from today reports ileus. Tube feeds held. Small bore NGT removed and large bore NGT to be placed for suction. Plan will be to attempt gastric feeds once ileus resolves. Pt remains at high refeed risk. Per chart, pt is up ~20lbs since admission. Pt +9.3L on his I & Os.   Medications reviewed and include: insulin, rena-vit, zosyn   Labs reviewed: K 4.8 wnl, BUN 88(H), creat 9.65(H), Ca 4.2(L), P 6.8(H), Mg 2.2 wnl Wbc- 14.2(H), Hgb 9.2(L), Hct 27.6(L) Cbgs- 169, 189, 176, 196 x 24 hrs  Diet Order:   Diet Order             Diet - low sodium heart healthy           Diet NPO time  specified  Diet effective now                  EDUCATION NEEDS:   Education needs have been addressed  Skin:  Skin Assessment: Reviewed RN Assessment  Last BM:  6/18- type 7  Height:   Ht Readings from Last 1 Encounters:  06/10/22 6' (1.829 m)    Weight:   Wt Readings from Last 1 Encounters:  06/14/22 105.7 kg    Ideal Body Weight:  80.9 kg  BMI:  Body mass index is 31.6 kg/m.  Estimated Nutritional Needs:   Kcal:  2200-2500kcal/day  Protein:  110-125g/day  Fluid:  2.1-2.4L/day  CaseyKoleen DistanceRD, LDN Please refer to AMIONCarrillo Surgery CenterRD and/or RD on-call/weekend/after hours pager

## 2022-06-14 NOTE — Progress Notes (Signed)
Vital signs reviewed, ICU needs resolved  Will sign off at this time. No further recommendations at this time.  Please call [phone] for further questions. Thank you.    Alysah Carton David Karolee Meloni, M.D.  [facility]  Medical Director ICU-[facility] [place] Medical Director [facility] Cardio-Pulmonary Department   

## 2022-06-14 NOTE — Progress Notes (Signed)
[facility]  ROUNDING NOTE   Subjective:   Patient seen and evaluated at bedside in ICU Abdomen is distended Shortness of breath overnight, placed on 2L [place] Appears weaker, less talkative Cortrak- Tube feeds at 65 cc/h Currently has Foley in place with blood-tinged urine  Objective:  Vital signs in last 24 hours:  Temp:  [98.6 F (37 C)-99.5 F (37.5 C)] 99.5 F (37.5 C) (06/19 1000) Pulse Rate:  [87-108] 101 (06/19 1045) Resp:  [22-39] 30 (06/19 1045) BP: (105-153)/(53-93) 107/66 (06/19 1045) SpO2:  [88 %-98 %] 95 % (06/19 1045) Weight:  [105.7 kg] 105.7 kg (06/19 1020)  Weight change: -0.2 kg Filed Weights   06/12/22 1722 06/14/22 0500 06/14/22 1020  Weight: 105.1 kg 105.7 kg 105.7 kg    Intake/Output: I/O last 3 completed shifts: In: 2762.4 [I.V.:0.1; NG/GT:2275; IV Piggyback:487.3] Out: 411 [Urine:410; Stool:1]   Intake/Output this shift:  Total I/O In: 285.1 [NG/GT:195; IV Piggyback:90.1] Out: -   Physical Exam: General: NAD, restless  Head: Normocephalic, atraumatic. Moist oral mucosal membranes  Eyes: Anicteric  Lungs:  Bilateral coarse breath sounds, 2L  Heart: Regular rate and rhythm  Abdomen:  Distended, mild diffuse tenderness  Extremities: Some dependent peripheral edema.  Neurologic: Somnolent  Skin: Warm, dry  Access: Left IJ dialysis catheter 06/01/2022.  Donell Beers.    Basic Metabolic Panel: Recent Labs  Lab 06/10/22 1700 06/11/22 0419 06/11/22 1147 06/12/22 0449 06/13/22 0450 06/14/22 0423  NA  --  137 137 136 136 135  K  --  4.3 4.2 4.2 4.3 4.8  CL  --  101 99 100 99 98  CO2  --  '26 25 25 24 23  '$ GLUCOSE  --  138* 131* 156* 182* 210*  BUN  --  78* 85* 80* 83* 88*  CREATININE  --  6.32* 6.99* 6.76* 7.54* 9.65*  CALCIUM  --  4.3* <4.0* 4.0* 4.2* 4.2*  MG  --   --   --  1.8 2.1 2.2  PHOS 3.4  --   --  4.0 5.0* 6.8*     Liver Function Tests: Recent Labs  Lab 06/10/22 0532 06/11/22 0419 06/12/22 0449  06/13/22 0450 06/14/22 0423  AST 593* 262* 162* 106* 75*  ALT 374* 218* 148* 105* 74*  ALKPHOS 44 41 51 62 61  BILITOT 1.7* 1.5* 0.9 0.7 0.8  PROT 5.6* 5.4* 5.4* 5.5* 5.3*  ALBUMIN 2.8* 2.7* 2.4* 2.3* 2.7*    Recent Labs  Lab 06/08/22 1810 06/09/22 0415 06/10/22 0532 06/12/22 0449  LIPASE 1,633* 1,741* 688* 102*    Recent Labs  Lab 06/10/22 1900  AMMONIA 13     CBC: Recent Labs  Lab 06/09/22 0415 06/10/22 0532 06/10/22 1529 06/11/22 0419 06/12/22 0449 06/13/22 0450 06/14/22 0423  WBC 11.4*   < > 14.2* 13.3* 13.8* 12.7* 14.2*  NEUTROABS 9.7*  --   --  11.2* 11.5* 10.1* 12.1*  HGB 14.3   < > 14.3 12.9* 11.2* 10.6* 9.2*  HCT 43.9   < > 44.1 38.8* 33.6* 31.6* 27.6*  MCV 84.1   < > 84.0 82.4 81.2 81.9 79.8*  PLT 157   < > 167 157 159 146* 141*   < > = values in this interval not displayed.     Cardiac Enzymes: Recent Labs  Lab 06/10/22 1618 06/11/22 0419  CKTOTAL 1,611* 1,740*     BNP: Invalid input(s): "POCBNP"  CBG: Recent Labs  Lab 06/13/22 1601 06/13/22 2034 06/14/22 0015 06/14/22 0421 06/14/22  [place]     Microbiology: Results for orders placed or performed during the hospital encounter of 06/08/22  Blood culture (routine x 2)     Status: None   Collection Time: 06/08/22  1:33 PM   Specimen: BLOOD  Result Value Ref Range Status   Specimen Description BLOOD LEFT HAND  Final   Special Requests   Final    BOTTLES DRAWN AEROBIC AND ANAEROBIC Blood Culture results may not be optimal due to an inadequate volume of blood received in culture bottles   Culture   Final    NO GROWTH 5 DAYS Performed at [facility], [address], [place], [place] [address]    Report Status 06/13/2022 FINAL  Final  Blood culture (routine x 2)     Status: None   Collection Time: 06/08/22  2:15 PM   Specimen: BLOOD  Result Value Ref Range Status   Specimen Description BLOOD BLOOD LEFT WRIST  Final   Special Requests    Final    BOTTLES DRAWN AEROBIC AND ANAEROBIC Blood Culture results may not be optimal due to an inadequate volume of blood received in culture bottles   Culture   Final    NO GROWTH 5 DAYS Performed at [facility], [address] [place], [place] [address]    Report Status 06/13/2022 FINAL  Final  MRSA Next Gen by PCR, Nasal     Status: None   Collection Time: 06/10/22  4:14 PM   Specimen: Nasal Mucosa; Nasal Swab  Result Value Ref Range Status   MRSA by PCR Next Gen NOT DETECTED NOT DETECTED Final    Comment: (NOTE) The GeneXpert MRSA Assay (FDA approved for NASAL specimens only), is one component of a comprehensive MRSA colonization surveillance program. It is not intended to diagnose MRSA infection nor to guide or monitor treatment for MRSA infections. Test performance is not FDA approved in patients less than 36 years old. Performed at [address] [facility], [place] [place],  [address]   SARS Coronavirus 2 by RT PCR (hospital order, performed in [facility] hospital lab) *cepheid single result test* Anterior Nasal Swab     Status: None   Collection Time: 06/10/22  5:17 PM   Specimen: Anterior Nasal Swab  Result Value Ref Range Status   SARS Coronavirus 2 by RT PCR NEGATIVE NEGATIVE Final    Comment: (NOTE) SARS-CoV-2 target nucleic acids are NOT DETECTED.  The SARS-CoV-2 RNA is generally detectable in upper and lower respiratory specimens during the acute phase of infection. The lowest concentration of SARS-CoV-2 viral copies this assay can detect is 250 copies / mL. A negative result does not preclude SARS-CoV-2 infection and should not be used as the sole basis for treatment or other patient management decisions.  A negative result may occur with improper specimen collection / handling, submission of specimen other than nasopharyngeal swab, presence of viral mutation(s) within the areas targeted by this assay, and inadequate number of viral  copies (<250 copies / mL). A negative result must be combined with clinical observations, patient history, and epidemiological information.  Fact Sheet for Patients:   https://www.patel.info/  Fact Sheet for Healthcare Providers: https://hall.com/  This test is not yet approved or  cleared by the [place] FDA and has been authorized for detection and/or diagnosis of SARS-CoV-2 by FDA under an Emergency Use Authorization (EUA).  This EUA will remain in effect (meaning this test can be used) for  the duration of the COVID-19 declaration under Section 564(b)(1) of the Act, 21 U.S.C. section 360bbb-3(b)(1), unless the authorization is terminated or revoked sooner.  Performed at [facility], [address] [place], [place] [address]     Coagulation Studies: Recent Labs    06/12/22 0449  LABPROT 17.7*  INR 1.5*     Urinalysis: No results for input(s): "COLORURINE", "LABSPEC", "PHURINE", "GLUCOSEU", "HGBUR", "BILIRUBINUR", "KETONESUR", "PROTEINUR", "UROBILINOGEN", "NITRITE", "LEUKOCYTESUR" in the last 72 hours.  Invalid input(s): "APPERANCEUR"     Imaging: [place] ASCITES (ABDOMEN LIMITED)  Result Date: 06/14/2022 CLINICAL DATA:  Evaluate for ascites EXAM: LIMITED ABDOMEN ULTRASOUND FOR ASCITES TECHNIQUE: Limited ultrasound survey for ascites was performed in all four abdominal quadrants. COMPARISON:  None Available. FINDINGS: There is no sonographic evidence of ascites in all 4 quadrants of abdomen. IMPRESSION: There is no demonstrable ascites. Electronically Signed   By: Elmer Picker M.D.   On: 06/14/2022 08:57   DG Chest Port 1 View  Result Date: 06/14/2022 CLINICAL DATA:  Shortness of breath. EXAM: PORTABLE CHEST 1 VIEW COMPARISON:  June 11, 2022. FINDINGS: Stable cardiomediastinal silhouette. Feeding tube is seen entering stomach. Hypoinflation of the lungs is noted with mild bibasilar subsegmental atelectasis.  Minimal left pleural effusion is noted. Bony thorax is unremarkable. IMPRESSION: Hypoinflation of the lungs is noted with mild bibasilar subsegmental atelectasis. Minimal left pleural effusion. Electronically Signed   By: Marijo Conception M.D.   On: 06/14/2022 08:11     Medications:    sodium chloride Stopped (06/13/22 2043)   feeding supplement (OSMOLITE 1.5 CAL) 65 mL/hr at 06/14/22 1000   furosemide 120 mg (06/14/22 1031)   piperacillin-tazobactam (ZOSYN)  IV Stopped (06/14/22 1023)    Chlorhexidine Gluconate Cloth  6 each Topical Q0600   feeding supplement (PROSource TF)  45 mL Per Tube BID   free water  30 mL Per Tube Q4H   insulin aspart  0-15 Units Subcutaneous Q4H   multivitamin  1 tablet Per Tube QHS   mouth rinse  15 mL Mouth Rinse 4 times per day   QUEtiapine  100 mg Per Tube QHS   sodium chloride, acetaminophen **OR** acetaminophen, alteplase, heparin, HYDROmorphone (DILAUDID) injection, hydrOXYzine, ipratropium-albuterol, lidocaine (PF), lidocaine-prilocaine, metoCLOPramide (REGLAN) injection, mouth rinse, mouth rinse, oxyCODONE, pentafluoroprop-tetrafluoroeth, polyethylene glycol, prochlorperazine  Assessment/ Plan:  Xavier Esparza is a 76 y.o.  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  Normal kidney function prior to admission.  IV contrast exposure on 06/08/2022 and 06/09/2022.  Renal ultrasound negative for obstruction.  Urinalysis shows hematuria and mild proteinuria.   ?   Gross hematuria related to Foley catheter.  Patient has history of prostate cancer.   Received first dialysis treatment on June 16.   - Urine output of 211m recorded yesterday.  -Required supplemental oxygen overnight due to shortness of breath and desats. Coarse breath sounds.  -Will perform dialysis today, UF goal 1.5 L.  We will consider albumin however patient received 4 doses of albumin IV yesterday. -We will consider dialysis need daily.  Lab Results  Component Value Date   CREATININE 9.65 (H)  06/14/2022   CREATININE 7.54 (H) 06/13/2022   CREATININE 6.76 (H) 06/12/2022    Intake/Output Summary (Last 24 hours) at 06/14/2022 1049 Last data filed at 06/14/2022 1000 Gross per 24 hour  Intake 2087.17 ml  Output 230 ml  Net 1857.17 ml    Acute pancreatitis, severe necrotizing.  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  Currently n.p.o.  Supportive care with antiemetics and pain control.   Currently getting IV Zosyn.  Dosed by pharmacy.   Diabetes mellitus type 2 without complication.Home regimen includes metformin.  Hemoglobin A1c 6.2 on 01/08/2022.  -Agree with holding metformin. -Glucose slightly elevated, SSI managed by primary team   LOS: 6 [place] 6/19/202310:49 AM

## 2022-06-14 NOTE — Progress Notes (Signed)
OT Cancellation Note  Patient Details Name: [name] MRN: [id number] DOB: 12-24-1946   Cancelled Treatment:    Reason Eval/Treat Not Completed: Other (comment). Chart reviewed. Pt receiving dialysis, unavailable for OT evaluation this morning. Will re-attempt next date as medically appropriate.   Ardeth Perfect., MPH, MS, OTR/L ascom [phone] 06/14/22, 2:06 PM

## 2022-06-14 NOTE — Progress Notes (Signed)
\ PROGRESS NOTE    DESTON BILYEU  DGL:[id number] DOB: Feb 05, 1946 DOA: 06/08/2022 PCP: Tonia Ghent, MD    Brief Narrative:  76 y.o. male with medical history significant for DM, BPH, history of prostate cancer, kidney stones status post lithotripsy, negative colonoscopy 2022 (3 benign polyps), who presents to the ED by EMS following a syncopal episode at home while walking to the bathroom.  Wife said she heard a loud thud and went to find him on the floor.  She said he was awake when she got there but he could not help himself up and she called 911.  Patient states he was in his usual state of health but about 2 weeks prior he saw a little blood in his urine similar to when he had his kidney stone.  He saw his urologist yesterday who gave him a prescription for Levaquin.  He otherwise denied feeling unwell previous to the episode.  He denied chest pain, shortness of breath, headache or visual disturbance, one-sided numbness weakness or tingling, nausea or vomiting, abdominal pain, urinary frequency or burning or diarrhea.[facility] reported BP 76/45 and he was treated with IM epinephrine and a 400 mL fluid bolus.  He was awake and alert x4 by arrival.  After his arrival in the emergency room he developed abdominal pain and vomiting.   Initial CT abdomen pelvis with moderate acute pancreatitis no evidence of pancreatic necrosis or pseudocyst.  Follow-up MRI abdomen concerning for possible choledocholithiasis with evidence of necrotizing pancreatitis.  Case discussed with GI Dr. Marius Ditch at [facility].   There was mention of possible portal vein thrombosis.  Heparin GTT was initially started however patient developed suspected acute hematuria so heparin GTT was stopped.  VQ scan ordered and perfusion component will be completed prior to patient transfer.   6/15: Clinical status overall unchanged.  Patient remains with abdominal pain associated with nausea.  Hemodynamically stable.  Mild elevation in white count.   Lipase and LFTs have improved over interval.  Discussed case with GI and general surgery.  Likely that patient's presentation is secondary to necrotizing pancreatitis.  Currently as there is no CBD stone and there is no strong indication for transfer for ERCP.  From a surgical standpoint no intervention is recommended.  There is nothing from an interventional radiology standpoint to drain.  As such we will discontinue transfer and keep patient in [facility].  6/16: No appreciable change in clinical status.  LFTs are downtrending.  White count slightly downtrending.  No fevers noted over interval.  Increasing abdominal distention noted.  Ascites and peripancreatic fluid seen on repeat CAT scan today.  Discussed case with PCCM, nephrology, GI.  Will attempt postpyloric feedings.  Kidney function continues to deteriorate.  Nephrology will initiate hemodialysis today.  6/17: No change in clinical status.  White count remains minimally elevated.  No fevers over interval.  Remains with abdominal distention.  Started HD yesterday.  We will run another round of HD today.  6/18: No appreciable clinical changes.  White blood cell count slightly downtrending.  Creatinine worsened despite 2 rounds of hemodialysis.  Remains with hematuria despite stopping heparin 2 days ago.  6/19: Increasing abd distention.  KUB consistent with ileus.  [place] negative for ascites   Assessment & Plan:   Principal Problem:   Severe sepsis ([place]) Active Problems:   Acute pancreatitis   Liver mass, right lobe, abscess vs mass   Portal vein thrombosis   Syncope   AKI (acute kidney injury) ([place])  HTN (hypertension)   Diabetes mellitus without complication (HCC)   BPH (benign prostatic hyperplasia)   Nephrolithiasis   History of prostate cancer   Necrotizing pancreatitis  Severe necrotizing pancreatitis Portal vein thrombosis Patient presented with intractable nausea and vomiting.  Ill-defined heterogeneous mass in the liver.  MRI  and MRCP revealed severe acute necrotizing pancreatitis with associated portal venous thrombosis.  No evidence of CBD stone.  Microabscess noted.  Plan: Complex management.  Will need involvement from multiple consult services.  Consultants engaged include GI, general surgery, ID, nephrology, ICU.    IV fluids currently on hold.  Appreciate ICU assistance.  Tube feeds on hold, restart trickle feeds as clinical status allows  Continue holding heparin GTT.  Patient with persistent hematuria despite stopping heparin previously.  Hemoglobin also drifting downward.  At this time risks of anticoagulation outweigh benefits.  We will continue to monitor carefully and restart anticoagulation as appropriate  Acute ileus In the setting of severe acute pancreatitis and IV narcotic administration Ideally would benefit from movement, but limited by pain Plan: DC dobhoff Place NGT, place to LIS Place rectal foley Tube feeds on hold  Acute kidney injury progressing Significant and in the setting of aggressive fluid resuscitation.  AKI likely secondary to ATN in the setting of severe acute pancreatitis Vas-Cath placed and initiated on HD 6/16 Status post inpatient HD 6/16, 6/17 Plan: HD today  Syncopal event Suspect secondary to hypotension versus vasovagal event PE ruled out, VQ scan low probability Continue telemetry monitoring  Hematuria History of nephrolithiasis History history of BPH History of prostate cancer No evidence of urinary retention despite worsening kidney function Continue Foley catheter  Diabetes mellitus without complication Sliding-scale coverage  DVT prophylaxis: SQ heparin Code Status: Full Family Communication: Wife at bedside 6/15, 6/17, 6/19 Disposition Plan: Status is: Inpatient Remains inpatient appropriate because: Severe acute necrotizing pancreatitis  Level of care: Stepdown  Consultants:  ICU GI General surgery ID Nephrology  Procedures:   None  Antimicrobials: Zosyn   Subjective: Patient seen and examined.  Lying in bed.  Appears uncomfortable  Objective: Vitals:   06/14/22 1339 06/14/22 1400 06/14/22 1402 06/14/22 1403  BP:   126/75   Pulse: (!) 113 (!) 115 (!) 112   Resp: (!) 34 (!) 34 (!) 35   Temp:   97.8 F (36.6 C)   TempSrc:      SpO2: 98% 93% 93%   Weight:    105 kg  Height:        Intake/Output Summary (Last 24 hours) at 06/14/2022 1556 Last data filed at 06/14/2022 1400 Gross per 24 hour  Intake 2105.66 ml  Output 1732 ml  Net 373.66 ml   Filed Weights   06/14/22 0500 06/14/22 1020 06/14/22 1403  Weight: 105.7 kg 105.7 kg 105 kg    Examination:  General exam: Appears uncomfortable Respiratory system: Tachypneic, bilateral crackles, 2L Cardiovascular system: Tachycardic, reg rhythm, no murmur Gastrointestinal system: Distended, right lower quadrant tenderness Central nervous system: Alert and oriented. No focal neurological deficits. Extremities: Symmetric 5 x 5 power. Skin: No rashes, lesions or ulcers Psychiatry: Judgement and insight appear normal. Mood & affect flattened.     Data Reviewed: I have personally reviewed following labs and imaging studies  CBC: Recent Labs  Lab 06/09/22 0415 06/10/22 0532 06/10/22 1529 06/11/22 0419 06/12/22 0449 06/13/22 0450 06/14/22 0423  WBC 11.4*   < > 14.2* 13.3* 13.8* 12.7* 14.2*  NEUTROABS 9.7*  --   --  11.2* 11.5*  10.1* 12.1*  HGB 14.3   < > 14.3 12.9* 11.2* 10.6* 9.2*  HCT 43.9   < > 44.1 38.8* 33.6* 31.6* 27.6*  MCV 84.1   < > 84.0 82.4 81.2 81.9 79.8*  PLT 157   < > 167 157 159 146* 141*   < > = values in this interval not displayed.   Basic Metabolic Panel: Recent Labs  Lab 06/10/22 1700 06/11/22 0419 06/11/22 1147 06/12/22 0449 06/13/22 0450 06/14/22 0423  NA  --  137 137 136 136 135  K  --  4.3 4.2 4.2 4.3 4.8  CL  --  101 99 100 99 98  CO2  --  '26 25 25 24 23  '$ GLUCOSE  --  138* 131* 156* 182* 210*  BUN  --  78*  85* 80* 83* 88*  CREATININE  --  6.32* 6.99* 6.76* 7.54* 9.65*  CALCIUM  --  4.3* <4.0* 4.0* 4.2* 4.2*  MG  --   --   --  1.8 2.1 2.2  PHOS 3.4  --   --  4.0 5.0* 6.8*   GFR: Estimated Creatinine Clearance: 8 mL/min (A) (by C-G formula based on SCr of 9.65 mg/dL (H)). Liver Function Tests: Recent Labs  Lab 06/10/22 0532 06/11/22 0419 06/12/22 0449 06/13/22 0450 06/14/22 0423  AST 593* 262* 162* 106* 75*  ALT 374* 218* 148* 105* 74*  ALKPHOS 44 41 51 62 61  BILITOT 1.7* 1.5* 0.9 0.7 0.8  PROT 5.6* 5.4* 5.4* 5.5* 5.3*  ALBUMIN 2.8* 2.7* 2.4* 2.3* 2.7*   Recent Labs  Lab 06/08/22 1810 06/09/22 0415 06/10/22 0532 06/12/22 0449  LIPASE 1,633* 1,741* 688* 102*   Recent Labs  Lab 06/10/22 1900  AMMONIA 13   Coagulation Profile: Recent Labs  Lab 06/08/22 2028 06/12/22 0449  INR 1.1 1.5*   Cardiac Enzymes: Recent Labs  Lab 06/10/22 1618 06/11/22 0419  CKTOTAL 1,611* 1,740*   BNP (last 3 results) No results for input(s): "PROBNP" in the last 8760 hours. HbA1C: No results for input(s): "HGBA1C" in the last 72 hours. CBG: Recent Labs  Lab 06/13/22 2034 06/14/22 0015 06/14/22 0421 06/14/22 0732 06/14/22 1120  GLUCAP 170* 196* 176* 189* 169*   Lipid Profile: No results for input(s): "CHOL", "HDL", "LDLCALC", "TRIG", "CHOLHDL", "LDLDIRECT" in the last 72 hours.  Thyroid Function Tests: No results for input(s): "TSH", "T4TOTAL", "FREET4", "T3FREE", "THYROIDAB" in the last 72 hours. Anemia Panel: No results for input(s): "VITAMINB12", "FOLATE", "FERRITIN", "TIBC", "IRON", "RETICCTPCT" in the last 72 hours. Sepsis Labs: Recent Labs  Lab 06/09/22 0415 06/10/22 0532 06/10/22 0938 06/10/22 1529 06/10/22 1813 06/11/22 1544  PROCALCITON 6.81 19.36  --   --   --   --   LATICACIDVEN  --  2.9* 3.5* 4.0* 2.4* 1.7    Recent Results (from the past 240 hour(s))  Blood culture (routine x 2)     Status: None   Collection Time: 06/08/22  1:33 PM   Specimen: BLOOD   Result Value Ref Range Status   Specimen Description BLOOD LEFT HAND  Final   Special Requests   Final    BOTTLES DRAWN AEROBIC AND ANAEROBIC Blood Culture results may not be optimal due to an inadequate volume of blood received in culture bottles   Culture   Final    NO GROWTH 5 DAYS Performed at [facility], [address] [place], [place] [address]    Report Status 06/13/2022 FINAL  Final  Blood culture (routine x 2)  Status: None   Collection Time: 06/08/22  2:15 PM   Specimen: BLOOD  Result Value Ref Range Status   Specimen Description BLOOD BLOOD LEFT WRIST  Final   Special Requests   Final    BOTTLES DRAWN AEROBIC AND ANAEROBIC Blood Culture results may not be optimal due to an inadequate volume of blood received in culture bottles   Culture   Final    NO GROWTH 5 DAYS Performed at [facility], [address] [place], [place] [address]    Report Status 06/13/2022 FINAL  Final  MRSA Next Gen by PCR, Nasal     Status: None   Collection Time: 06/10/22  4:14 PM   Specimen: Nasal Mucosa; Nasal Swab  Result Value Ref Range Status   MRSA by PCR Next Gen NOT DETECTED NOT DETECTED Final    Comment: (NOTE) The GeneXpert MRSA Assay (FDA approved for NASAL specimens only), is one component of a comprehensive MRSA colonization surveillance program. It is not intended to diagnose MRSA infection nor to guide or monitor treatment for MRSA infections. Test performance is not FDA approved in patients less than 58 years old. Performed at [facility], [address] [place], [place] [address]   SARS Coronavirus 2 by RT PCR (hospital order, performed in [facility] hospital lab) *cepheid single result test* Anterior Nasal Swab     Status: None   Collection Time: 06/10/22  5:17 PM   Specimen: Anterior Nasal Swab  Result Value Ref Range Status   SARS Coronavirus 2 by RT PCR NEGATIVE NEGATIVE Final    Comment: (NOTE) SARS-CoV-2 target nucleic acids are  NOT DETECTED.  The SARS-CoV-2 RNA is generally detectable in upper and lower respiratory specimens during the acute phase of infection. The lowest concentration of SARS-CoV-2 viral copies this assay can detect is 250 copies / mL. A negative result does not preclude SARS-CoV-2 infection and should not be used as the sole basis for treatment or other patient management decisions.  A negative result may occur with improper specimen collection / handling, submission of specimen other than nasopharyngeal swab, presence of viral mutation(s) within the areas targeted by this assay, and inadequate number of viral copies (<250 copies / mL). A negative result must be combined with clinical observations, patient history, and epidemiological information.  Fact Sheet for Patients:   https://www.patel.info/  Fact Sheet for Healthcare Providers: https://hall.com/  This test is not yet approved or  cleared by the [place] FDA and has been authorized for detection and/or diagnosis of SARS-CoV-2 by FDA under an Emergency Use Authorization (EUA).  This EUA will remain in effect (meaning this test can be used) for the duration of the COVID-19 declaration under Section 564(b)(1) of the Act, 21 U.S.C. section 360bbb-3(b)(1), unless the authorization is terminated or revoked sooner.  Performed at [facility], [address], [place], [place] [address]          Radiology Studies: DG Abd 1 View  Result Date: 06/14/2022 CLINICAL DATA:  Ileus EXAM: ABDOMEN - 1 VIEW COMPARISON:  None Available. FINDINGS: Feeding tube tip overlies the fourth portion of the duodenum. There is diffuse mild gaseous distension of bowel in a nonobstructive pattern. IMPRESSION: Mild diffuse gaseous distension foot of bowel, compatible with ileus. Electronically Signed   By: Maurine Simmering M.D.   On: 06/14/2022 15:22   [place] ASCITES (ABDOMEN LIMITED)  Result Date:  06/14/2022 CLINICAL DATA:  Evaluate for ascites EXAM: LIMITED ABDOMEN ULTRASOUND FOR ASCITES TECHNIQUE: Limited  ultrasound survey for ascites was performed in all four abdominal quadrants. COMPARISON:  None Available. FINDINGS: There is no sonographic evidence of ascites in all 4 quadrants of abdomen. IMPRESSION: There is no demonstrable ascites. Electronically Signed   By: Elmer Picker M.D.   On: 06/14/2022 08:57   DG Chest Port 1 View  Result Date: 06/14/2022 CLINICAL DATA:  Shortness of breath. EXAM: PORTABLE CHEST 1 VIEW COMPARISON:  June 11, 2022. FINDINGS: Stable cardiomediastinal silhouette. Feeding tube is seen entering stomach. Hypoinflation of the lungs is noted with mild bibasilar subsegmental atelectasis. Minimal left pleural effusion is noted. Bony thorax is unremarkable. IMPRESSION: Hypoinflation of the lungs is noted with mild bibasilar subsegmental atelectasis. Minimal left pleural effusion. Electronically Signed   By: Marijo Conception M.D.   On: 06/14/2022 08:11        Scheduled Meds:  Chlorhexidine Gluconate Cloth  6 each Topical Q0600   feeding supplement (PROSource TF)  45 mL Per Tube BID   free water  30 mL Per Tube Q4H   heparin sodium (porcine)       insulin aspart  0-15 Units Subcutaneous Q4H   multivitamin  1 tablet Per Tube QHS   mouth rinse  15 mL Mouth Rinse 4 times per day   [START ON 06/15/2022] polyethylene glycol  17 g Per Tube Daily   QUEtiapine  100 mg Per Tube QHS   sennosides  5 mL Per Tube BID   Continuous Infusions:  sodium chloride Stopped (06/13/22 2043)   feeding supplement (OSMOLITE 1.5 CAL) 65 mL/hr at 06/14/22 1400   piperacillin-tazobactam (ZOSYN)  IV Stopped (06/14/22 1023)     LOS: 6 days         Sidney Ace, MD [facility]   If 7PM-7AM, please contact night-coverage  06/14/2022, 3:56 PM

## 2022-06-15 DIAGNOSIS — K8591 Acute pancreatitis with uninfected necrosis, unspecified: Secondary | ICD-10-CM | POA: Diagnosis not present

## 2022-06-15 DIAGNOSIS — A419 Sepsis, unspecified organism: Secondary | ICD-10-CM | POA: Diagnosis not present

## 2022-06-15 DIAGNOSIS — K750 Abscess of liver: Secondary | ICD-10-CM

## 2022-06-15 DIAGNOSIS — K8502 Idiopathic acute pancreatitis with infected necrosis: Secondary | ICD-10-CM | POA: Diagnosis not present

## 2022-06-15 DIAGNOSIS — N179 Acute kidney failure, unspecified: Secondary | ICD-10-CM | POA: Diagnosis not present

## 2022-06-15 LAB — CBC WITH DIFFERENTIAL/PLATELET
Abs Immature Granulocytes: 0.83 10*3/uL — ABNORMAL HIGH (ref 0.00–0.07)
Basophils Absolute: 0.1 10*3/uL (ref 0.0–0.1)
Basophils Relative: 0 %
Eosinophils Absolute: 0.1 10*3/uL (ref 0.0–0.5)
Eosinophils Relative: 0 %
HCT: 28.5 % — ABNORMAL LOW (ref 39.0–52.0)
Hemoglobin: 9.7 g/dL — ABNORMAL LOW (ref 13.0–17.0)
Immature Granulocytes: 4 %
Lymphocytes Relative: 4 %
Lymphs Abs: 0.8 10*3/uL (ref 0.7–4.0)
MCH: 27 pg (ref 26.0–34.0)
MCHC: 34 g/dL (ref 30.0–36.0)
MCV: 79.4 fL — ABNORMAL LOW (ref 80.0–100.0)
Monocytes Absolute: 1.1 10*3/uL — ABNORMAL HIGH (ref 0.1–1.0)
Monocytes Relative: 5 %
Neutro Abs: 19.7 10*3/uL — ABNORMAL HIGH (ref 1.7–7.7)
Neutrophils Relative %: 87 %
Platelets: 185 10*3/uL (ref 150–400)
RBC: 3.59 MIL/uL — ABNORMAL LOW (ref 4.22–5.81)
RDW: 15.9 % — ABNORMAL HIGH (ref 11.5–15.5)
WBC: 22.6 10*3/uL — ABNORMAL HIGH (ref 4.0–10.5)
nRBC: 0.1 % (ref 0.0–0.2)

## 2022-06-15 LAB — COMPREHENSIVE METABOLIC PANEL
ALT: 64 U/L — ABNORMAL HIGH (ref 0–44)
AST: 72 U/L — ABNORMAL HIGH (ref 15–41)
Albumin: 2.6 g/dL — ABNORMAL LOW (ref 3.5–5.0)
Alkaline Phosphatase: 71 U/L (ref 38–126)
Anion gap: 18 — ABNORMAL HIGH (ref 5–15)
BUN: 71 mg/dL — ABNORMAL HIGH (ref 8–23)
CO2: 24 mmol/L (ref 22–32)
Calcium: 5.3 mg/dL — CL (ref 8.9–10.3)
Chloride: 97 mmol/L — ABNORMAL LOW (ref 98–111)
Creatinine, Ser: 9.07 mg/dL — ABNORMAL HIGH (ref 0.61–1.24)
GFR, Estimated: 5 mL/min — ABNORMAL LOW (ref >60)
Glucose, Bld: 153 mg/dL — ABNORMAL HIGH (ref 70–99)
Potassium: 5 mmol/L (ref 3.5–5.1)
Sodium: 139 mmol/L (ref 135–145)
Total Bilirubin: 1.1 mg/dL (ref 0.3–1.2)
Total Protein: 6 g/dL — ABNORMAL LOW (ref 6.5–8.1)

## 2022-06-15 LAB — GLUCOSE, CAPILLARY
Glucose-Capillary: 132 mg/dL — ABNORMAL HIGH (ref 70–99)
Glucose-Capillary: 139 mg/dL — ABNORMAL HIGH (ref 70–99)
Glucose-Capillary: 146 mg/dL — ABNORMAL HIGH (ref 70–99)
Glucose-Capillary: 149 mg/dL — ABNORMAL HIGH (ref 70–99)
Glucose-Capillary: 157 mg/dL — ABNORMAL HIGH (ref 70–99)
Glucose-Capillary: 180 mg/dL — ABNORMAL HIGH (ref 70–99)

## 2022-06-15 LAB — ANCA TITERS
Atypical P-ANCA titer: <1:20 {titer}
C-ANCA: <1:20 {titer}
P-ANCA: <1:20 {titer}

## 2022-06-15 MED ORDER — GUAIFENESIN-DM 100-10 MG/5ML PO SYRP
15.0000 mL | ORAL_SOLUTION | ORAL | Status: DC | PRN
  Filled 2022-06-15 (×2): qty 15

## 2022-06-15 MED ORDER — ORAL CARE MOUTH RINSE: 15.0000 mL | OROMUCOSAL | Status: DC | PRN

## 2022-06-15 MED ORDER — HEPARIN SODIUM (PORCINE) 1000 UNIT/ML IJ SOLN
INTRAMUSCULAR | Status: AC
  Administered 2022-06-15: 2800 [IU] via INTRAVENOUS_CENTRAL
  Filled 2022-06-15: qty 10

## 2022-06-15 MED ORDER — BENZONATATE 100 MG PO CAPS: 200.0000 mg | ORAL_CAPSULE | Freq: Three times a day (TID) | ORAL | Status: DC

## 2022-06-15 MED ORDER — ORAL CARE MOUTH RINSE: 15.0000 mL | OROMUCOSAL | Status: DC

## 2022-06-15 MED ORDER — GUAIFENESIN-DM 100-10 MG/5ML PO SYRP
5.0000 mL | ORAL_SOLUTION | ORAL | Status: DC | PRN
Start: 2022-06-15 — End: 2022-06-15
  Administered 2022-06-15: 5 mL
  Filled 2022-06-15: qty 5

## 2022-06-15 NOTE — Evaluation (Signed)
Occupational Therapy Evaluation Patient Details Name: Xavier Esparza MRN: [id number] DOB: [birth year] Today's Date: 06/15/2022   History of Present Illness Patient is a 76 y.o. male with medical history significant for DM, BPH,  prostate cancer, kidney stones status, who presents to the ED after  syncopal episode at home. Found to have severe acute necrotizing pancreatitis with associated portal venous thrombosis, AKI requiring dialysis, acute ileus   Clinical Impression   Pt was seen for OT evaluation an co-tx with PT this date. Prior to hospital admission, pt was independent, working some, and living with wife in 1 story home with basement (doesn't need to go in). Pt denies additional falls aside from passing out right before this admission. Currently pt demonstrates impairments as described below (See OT problem list) which functionally limit his ability to perform ADL/self-care tasks. Pt currently requires MOD A +2 for bed mobility and ADL transfers with RW, SBA for static and dynamic sitting balance, and MOD A for LB ADL tasks (aside from MAX A for pericare in standing), MIN A for grooming. Pt keeps eyes closed for most of session but will open his eyes with VC. Mild dizziness with sitting and standing. BP monitored, initial seated 118/72, after standing 105/67. RR up to 38 with standing, improving to upper 20's at rest. Pt would benefit from skilled OT services to address noted impairments and functional limitations (see below for any additional details) in order to maximize safety and independence while minimizing falls risk and caregiver burden. Upon hospital discharge, recommend STR to maximize pt safety and return to PLOF.    Recommendations for follow up therapy are one component of a multi-disciplinary discharge planning process, led by the attending physician.  Recommendations may be updated based on patient status, additional functional criteria and insurance authorization.   Follow Up  Recommendations  Skilled nursing-short term rehab (<3 hours/day)    Assistance Recommended at Discharge Frequent or constant Supervision/Assistance  Patient can return home with the following Two people to help with walking and/or transfers;A lot of help with bathing/dressing/bathroom;Assistance with cooking/housework;Assist for transportation;Help with stairs or ramp for entrance    Functional Status Assessment  Patient has had a recent decline in their functional status and demonstrates the ability to make significant improvements in function in a reasonable and predictable amount of time.  Equipment Recommendations  BSC/3in1;Other (comment) (2WW)    Recommendations for Other Services       Precautions / Restrictions Precautions Precautions: Fall Precaution Comments: fecal management system, NG tube, temporary left IJ line Restrictions Weight Bearing Restrictions: No      Mobility Bed Mobility Overal bed mobility: Needs Assistance Bed Mobility: Supine to Sit, Sit to Supine     Supine to sit: Mod assist, +2 for physical assistance Sit to supine: Mod assist, +2 for physical assistance   General bed mobility comments: assistance for trunk and BLE support. increased time and effort required with multi-modal cues needed    Transfers Overall transfer level: Needs assistance Equipment used: Rolling walker (2 wheels) Transfers: Sit to/from Stand Sit to Stand: Mod assist, +2 physical assistance           General transfer comment: cues for hand placement and technique. lifting and lowering assistance provided with standing. mild dizziness is reported during standing but unable to get standing BP      Balance Overall balance assessment: Needs assistance Sitting-balance support: Feet supported Sitting balance-Leahy Scale: Fair Sitting balance - Comments: no external support required to maintain static sitting.  close stand by assistance   Standing balance support: Bilateral  upper extremity supported, Reliant on assistive device for balance Standing balance-Leahy Scale: Poor Standing balance comment: external support required to maintain standing balance with standing tolerance os less than one minute                           ADL either performed or assessed with clinical judgement   ADL                                         General ADL Comments: Pt currently requires MOD-MAX A for LB ADL, MOD A +2 for ADL transfers with RW, and MIN A for UB ADL seated EOB.     Vision         Perception     Praxis      Pertinent Vitals/Pain Pain Assessment Pain Assessment: 0-10 Pain Score: 5  Pain Location: left abdomen 5/10 at start and with sitting/standing, 0/10 once returned to bed Pain Descriptors / Indicators: Discomfort Pain Intervention(s): Limited activity within patient's tolerance     Hand Dominance     Extremity/Trunk Assessment Upper Extremity Assessment Upper Extremity Assessment: Generalized weakness   Lower Extremity Assessment Lower Extremity Assessment: Generalized weakness       Communication Communication Communication: No difficulties   Cognition Arousal/Alertness: Lethargic Behavior During Therapy: Flat affect Overall Cognitive Status: No family/caregiver present to determine baseline cognitive functioning Area of Impairment: Following commands, Problem solving                       Following Commands: Follows one step commands with increased time     Problem Solving: Slow processing, Difficulty sequencing, Requires verbal cues, Requires tactile cues General Comments: patient lethargic initially but does have increased alertness with mobility and sitting upright     General Comments  mild dizziness with sitting and standing. BP monitored, initial seated 118/72, after standing 105/67. RR up to 38 with standing, improving to upper 20's at rest.    Exercises     Shoulder Instructions       Home Living Family/patient expects to be discharged to:: Private residence Living Arrangements: Spouse/significant other Available Help at Discharge: Family Type of Home: House Home Access: Stairs to enter Technical brewer of Steps: 3 Entrance Stairs-Rails: Can reach both;Left;Right Home Layout: Able to live on main level with bedroom/bathroom (has basement but doesn't need to go down there)     Bathroom Shower/Tub: Teacher, early years/pre: Handicapped height                Prior Functioning/Environment Prior Level of Function : Independent/Modified Independent             Mobility Comments: independent per patient report ADLs Comments: independent, part time work        OT Problem List: Decreased strength;Decreased range of motion;Pain;Decreased activity tolerance;Impaired balance (sitting and/or standing);Decreased knowledge of use of DME or AE      OT Treatment/Interventions: Self-care/ADL training;Therapeutic exercise;Therapeutic activities;DME and/or AE instruction;Patient/family education;Balance training    OT Goals(Current goals can be found in the care plan section) Acute Rehab OT Goals Patient Stated Goal: get better OT Goal Formulation: With patient Time For Goal Achievement: 06/29/22 Potential to Achieve Goals: Good ADL Goals Pt Will Perform Lower Body Dressing: sit to/from stand;with  min assist Pt Will Transfer to Toilet: bedside commode;ambulating;with min assist (LRAD) Pt Will Perform Toileting - Clothing Manipulation and hygiene: with modified independence Additional ADL Goal #1: Pt will complete bed mobility with MIN A +1 in anticipation of seated or OOB ADL.  OT Frequency: Min 3X/week    Co-evaluation PT/OT/SLP Co-Evaluation/Treatment: Yes Reason for Co-Treatment: Complexity of the patient's impairments (multi-system involvement);For patient/therapist safety PT goals addressed during session: Mobility/safety with  mobility OT goals addressed during session: ADL's and self-care      AM-PAC OT "6 Clicks" Daily Activity     Outcome Measure Help from another person eating meals?: Total (NPO) Help from another person taking care of personal grooming?: A Little Help from another person toileting, which includes using toliet, bedpan, or urinal?: Total Help from another person bathing (including washing, rinsing, drying)?: A Lot Help from another person to put on and taking off regular upper body clothing?: A Little Help from another person to put on and taking off regular lower body clothing?: A Lot 6 Click Score: 12   End of Session Equipment Utilized During Treatment: Rolling walker (2 wheels) Nurse Communication: Mobility status  Activity Tolerance: Patient tolerated treatment well Patient left: in bed;with call bell/phone within reach  OT Visit Diagnosis: Other abnormalities of gait and mobility (R26.89);Muscle weakness (generalized) (M62.81);History of falling (Z91.81)                Time: 4481-8563 OT Time Calculation (min): 27 min Charges:  OT General Charges $OT Visit: 1 Visit OT Evaluation $OT Eval Moderate Complexity: 1 Mod  Ardeth Perfect., MPH, MS, OTR/L ascom [phone] 06/15/22, 1:31 PM

## 2022-06-15 NOTE — Progress Notes (Signed)
[facility]  ROUNDING NOTE   Subjective:   Patient seen and evaluated at bedside in ICU Abdomen is firm, distended Weaned to room air NGT placed to low wall intermittent suction No lower extremity edema  Foley in place with blood-tinged urine  Objective:  Vital signs in last 24 hours:  Temp:  [97.8 F (36.6 C)-100.2 F (37.9 C)] 99.1 F (37.3 C) (06/20 0529) Pulse Rate:  [29-115] 93 (06/20 0800) Resp:  [20-39] 20 (06/20 0800) BP: (101-129)/(64-82) 118/70 (06/20 0800) SpO2:  [85 %-100 %] 90 % (06/20 0800) Weight:  [105 kg-105.7 kg] 105 kg (06/19 1403)  Weight change: 0 kg Filed Weights   06/14/22 0500 06/14/22 1020 06/14/22 1403  Weight: 105.7 kg 105.7 kg 105 kg    Intake/Output: I/O last 3 completed shifts: In: 1858.3 [I.V.:0.1; NG/GT:1180; IV Piggyback:678.2] Out: 2607 [Urine:155; Emesis/NG output:950; Other:1502]   Intake/Output this shift:  Total I/O In: 30 [Other:30] Out: 600 [Emesis/NG output:600]  Physical Exam: General: NAD, restless  Head: Normocephalic, atraumatic. Moist oral mucosal membranes  Eyes: Anicteric  Lungs:  Bilateral coarse breath sounds, room air  Heart: Regular rate and rhythm  Abdomen:  Firm, distended, mild diffuse tenderness  Extremities: Some dependent peripheral edema.  Neurologic: Arousable  Skin: Warm, dry  Access: Left IJ dialysis catheter 06/01/2022.  Xavier Esparza.    Basic Metabolic Panel: Recent Labs  Lab 06/10/22 1700 06/11/22 0419 06/11/22 1147 06/12/22 0449 06/13/22 0450 06/14/22 0423 06/15/22 0421  NA  --    < > 137 136 136 135 139  K  --    < > 4.2 4.2 4.3 4.8 5.0  CL  --    < > 99 100 99 98 97*  CO2  --    < > '25 25 24 23 24  '$ GLUCOSE  --    < > 131* 156* 182* 210* 153*  BUN  --    < > 85* 80* 83* 88* 71*  CREATININE  --    < > 6.99* 6.76* 7.54* 9.65* 9.07*  CALCIUM  --    < > <4.0* 4.0* 4.2* 4.2* 5.3*  MG  --   --   --  1.8 2.1 2.2  --   PHOS 3.4  --   --  4.0 5.0* 6.8*  --    < > = values in  this interval not displayed.     Liver Function Tests: Recent Labs  Lab 06/11/22 0419 06/12/22 0449 06/13/22 0450 06/14/22 0423 06/15/22 0421  AST 262* 162* 106* 75* 72*  ALT 218* 148* 105* 74* 64*  ALKPHOS 41 51 62 61 71  BILITOT 1.5* 0.9 0.7 0.8 1.1  PROT 5.4* 5.4* 5.5* 5.3* 6.0*  ALBUMIN 2.7* 2.4* 2.3* 2.7* 2.6*    Recent Labs  Lab 06/08/22 1810 06/09/22 0415 06/10/22 0532 06/12/22 0449  LIPASE 1,633* 1,741* 688* 102*    Recent Labs  Lab 06/10/22 1900  AMMONIA 13     CBC: Recent Labs  Lab 06/11/22 0419 06/12/22 0449 06/13/22 0450 06/14/22 0423 06/15/22 0421  WBC 13.3* 13.8* 12.7* 14.2* 22.6*  NEUTROABS 11.2* 11.5* 10.1* 12.1* 19.7*  HGB 12.9* 11.2* 10.6* 9.2* 9.7*  HCT 38.8* 33.6* 31.6* 27.6* 28.5*  MCV 82.4 81.2 81.9 79.8* 79.4*  PLT 157 159 146* 141* 185     Cardiac Enzymes: Recent Labs  Lab 06/10/22 1618 06/11/22 0419  CKTOTAL 1,611* 1,740*     BNP: Invalid input(s): "POCBNP"  CBG: Recent Labs  Lab 06/14/22 1913 06/14/22  1948 06/14/22 2324 06/15/22 0351 06/15/22 0738  GLUCAP 155* 134* 152* 139* 149*     Microbiology: Results for orders placed or performed during the hospital encounter of 06/08/22  Blood culture (routine x 2)     Status: None   Collection Time: 06/08/22  1:33 PM   Specimen: BLOOD  Result Value Ref Range Status   Specimen Description BLOOD LEFT HAND  Final   Special Requests   Final    BOTTLES DRAWN AEROBIC AND ANAEROBIC Blood Culture results may not be optimal due to an inadequate volume of blood received in culture bottles   Culture   Final    NO GROWTH 5 DAYS Performed at [facility], [address] [place], [place] [address]    Report Status 06/13/2022 FINAL  Final  Blood culture (routine x 2)     Status: None   Collection Time: 06/08/22  2:15 PM   Specimen: BLOOD  Result Value Ref Range Status   Specimen Description BLOOD BLOOD LEFT WRIST  Final   Special Requests   Final    BOTTLES  DRAWN AEROBIC AND ANAEROBIC Blood Culture results may not be optimal due to an inadequate volume of blood received in culture bottles   Culture   Final    NO GROWTH 5 DAYS Performed at [facility], [address], [place], [place] [address]    Report Status 06/13/2022 FINAL  Final  MRSA Next Gen by PCR, Nasal     Status: None   Collection Time: 06/10/22  4:14 PM   Specimen: Nasal Mucosa; Nasal Swab  Result Value Ref Range Status   MRSA by PCR Next Gen NOT DETECTED NOT DETECTED Final    Comment: (NOTE) The GeneXpert MRSA Assay (FDA approved for NASAL specimens only), is one component of a comprehensive MRSA colonization surveillance program. It is not intended to diagnose MRSA infection nor to guide or monitor treatment for MRSA infections. Test performance is not FDA approved in patients less than 22 years old. Performed at [facility], [address] [place], [place] [address]   SARS Coronavirus 2 by RT PCR (hospital order, performed in [facility] hospital lab) *cepheid single result test* Anterior Nasal Swab     Status: None   Collection Time: 06/10/22  5:17 PM   Specimen: Anterior Nasal Swab  Result Value Ref Range Status   SARS Coronavirus 2 by RT PCR NEGATIVE NEGATIVE Final    Comment: (NOTE) SARS-CoV-2 target nucleic acids are NOT DETECTED.  The SARS-CoV-2 RNA is generally detectable in upper and lower respiratory specimens during the acute phase of infection. The lowest concentration of SARS-CoV-2 viral copies this assay can detect is 250 copies / mL. A negative result does not preclude SARS-CoV-2 infection and should not be used as the sole basis for treatment or other patient management decisions.  A negative result may occur with improper specimen collection / handling, submission of specimen other than nasopharyngeal swab, presence of viral mutation(s) within the areas targeted by this assay, and inadequate number of viral copies (<250 copies /  mL). A negative result must be combined with clinical observations, patient history, and epidemiological information.  Fact Sheet for Patients:   https://www.patel.info/  Fact Sheet for Healthcare Providers: https://hall.com/  This test is not yet approved or  cleared by the [place] FDA and has been authorized for detection and/or diagnosis of SARS-CoV-2 by FDA under an Emergency Use Authorization (EUA).  This EUA will remain in effect (meaning  this test can be used) for the duration of the COVID-19 declaration under Section 564(b)(1) of the Act, 21 U.S.C. section 360bbb-3(b)(1), unless the authorization is terminated or revoked sooner.  Performed at [facility], [address] [place],  [address]     Coagulation Studies: No results for input(s): "LABPROT", "INR" in the last 72 hours.   Urinalysis: No results for input(s): "COLORURINE", "LABSPEC", "PHURINE", "GLUCOSEU", "HGBUR", "BILIRUBINUR", "KETONESUR", "PROTEINUR", "UROBILINOGEN", "NITRITE", "LEUKOCYTESUR" in the last 72 hours.  Invalid input(s): "APPERANCEUR"     Imaging: DG Abd 1 View  Result Date: 06/14/2022 CLINICAL DATA:  [id number].  NG tube placement EXAM: ABDOMEN - 1 VIEW COMPARISON:  X-ray abdomen 06/14/2022 2:37 p.m. FINDINGS: Interval removal of an enteric tube. Interval placement of a new enteric tube with tip and side port overlying the gastric lumen. Persistent gaseous dilatation of the small and large bowel. Colonic diverticulosis. No radio-opaque calculi or other significant radiographic abnormality are seen. IMPRESSION: 1. Interval removal of an enteric tube. Interval placement of a new enteric tube with tip and side port overlying the gastric lumen. 2. Postop ileus. Electronically Signed   By: Iven Finn M.D.   On: 06/14/2022 16:56   DG Abd 1 View  Result Date: 06/14/2022 CLINICAL DATA:  Ileus EXAM: ABDOMEN - 1 VIEW COMPARISON:  None  Available. FINDINGS: Feeding tube tip overlies the fourth portion of the duodenum. There is diffuse mild gaseous distension of bowel in a nonobstructive pattern. IMPRESSION: Mild diffuse gaseous distension foot of bowel, compatible with ileus. Electronically Signed   By: Maurine Simmering M.D.   On: 06/14/2022 15:22   [place] ASCITES (ABDOMEN LIMITED)  Result Date: 06/14/2022 CLINICAL DATA:  Evaluate for ascites EXAM: LIMITED ABDOMEN ULTRASOUND FOR ASCITES TECHNIQUE: Limited ultrasound survey for ascites was performed in all four abdominal quadrants. COMPARISON:  None Available. FINDINGS: There is no sonographic evidence of ascites in all 4 quadrants of abdomen. IMPRESSION: There is no demonstrable ascites. Electronically Signed   By: Elmer Picker M.D.   On: 06/14/2022 08:57   DG Chest Port 1 View  Result Date: 06/14/2022 CLINICAL DATA:  Shortness of breath. EXAM: PORTABLE CHEST 1 VIEW COMPARISON:  June 11, 2022. FINDINGS: Stable cardiomediastinal silhouette. Feeding tube is seen entering stomach. Hypoinflation of the lungs is noted with mild bibasilar subsegmental atelectasis. Minimal left pleural effusion is noted. Bony thorax is unremarkable. IMPRESSION: Hypoinflation of the lungs is noted with mild bibasilar subsegmental atelectasis. Minimal left pleural effusion. Electronically Signed   By: Marijo Conception M.D.   On: 06/14/2022 08:11     Medications:    sodium chloride Stopped (06/13/22 2043)   feeding supplement (OSMOLITE 1.5 CAL) Stopped (06/14/22 1801)   piperacillin-tazobactam (ZOSYN)  IV Stopped (06/15/22 0221)    Chlorhexidine Gluconate Cloth  6 each Topical Q0600   feeding supplement (PROSource TF)  45 mL Per Tube BID   free water  30 mL Per Tube Q4H   heparin injection (subcutaneous)  5,000 Units Subcutaneous Q8H   insulin aspart  0-15 Units Subcutaneous Q4H   multivitamin  1 tablet Per Tube QHS   polyethylene glycol  17 g Per Tube Daily   QUEtiapine  100 mg Per Tube QHS    sennosides  5 mL Per Tube BID   sodium chloride, acetaminophen **OR** acetaminophen, alteplase, bisacodyl, heparin, HYDROmorphone (DILAUDID) injection, hydrOXYzine, ipratropium-albuterol, lidocaine (PF), lidocaine-prilocaine, metoCLOPramide (REGLAN) injection, mouth rinse, oxyCODONE, pentafluoroprop-tetrafluoroeth, polyethylene glycol, prochlorperazine  Assessment/ Plan:  Mr. Xavier Esparza is a 76  y.o.  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  Normal kidney function prior to admission.  IV contrast exposure on 06/08/2022 and 06/09/2022.  Renal ultrasound negative for obstruction.  Urinalysis shows hematuria and mild proteinuria.   ?  Gross hematuria related to Foley catheter.  Patient has history of prostate cancer.   Received first dialysis treatment on June 16.   - Received dialysis yesterday, UF 1.5L achieved.  Low urine output recorded yesterday.  - BUN remains elevated with borderline potassium. Will provide additional treatment today.  -Next treatment scheduled for Thursday  Lab Results  Component Value Date   CREATININE 9.07 (H) 06/15/2022   CREATININE 9.65 (H) 06/14/2022   CREATININE 7.54 (H) 06/13/2022    Intake/Output Summary (Last 24 hours) at 06/15/2022 1012 Last data filed at 06/15/2022 0900 Gross per 24 hour  Intake 436.19 ml  Output 3077 ml  Net -2640.81 ml    Acute pancreatitis, severe necrotizing.  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   Currently n.p.o.  Supportive care with antiemetics and pain control.  Prescribed Zosyn   Diabetes mellitus type 2 without complication.Home regimen includes metformin.  Hemoglobin A1c 6.2 on 01/08/2022.  -Agree with holding metformin. -Glucose slightly elevated, SSI managed by primary team  Acute ileus, receiving IV narcotics for severe acute pancreatitis. Tube feeds stopped and NGT placed to LWIS. Rectal tube placed   LOS: 7 [place] 6/20/202310:12 AM

## 2022-06-15 NOTE — Progress Notes (Signed)
Date of Admission:  06/08/2022     ID: Xavier Esparza is a 76 y.o. male  Principal Problem:   Severe sepsis ([place]) Active Problems:   Diabetes mellitus without complication (HCC)   BPH (benign prostatic hyperplasia)   Nephrolithiasis   Acute pancreatitis   Liver mass, right lobe, abscess vs mass   Portal vein thrombosis   AKI (acute kidney injury) ([place])   Syncope   History of prostate cancer   HTN (hypertension)   Necrotizing pancreatitis    Subjective: Pt has some confusion Some pain  Medications:   Chlorhexidine Gluconate Cloth  6 each Topical Q0600   feeding supplement (PROSource TF)  45 mL Per Tube BID   free water  30 mL Per Tube Q4H   heparin injection (subcutaneous)  5,000 Units Subcutaneous Q8H   heparin sodium (porcine)       insulin aspart  0-15 Units Subcutaneous Q4H   multivitamin  1 tablet Per Tube QHS   polyethylene glycol  17 g Per Tube Daily   QUEtiapine  100 mg Per Tube QHS   sennosides  5 mL Per Tube BID    Objective: Vital signs in last 24 hours: Temp:  [98.9 F (37.2 C)-100.2 F (37.9 C)] 99 F (37.2 C) (06/20 1200) Pulse Rate:  [92-113] 95 (06/20 1400) Resp:  [20-37] 27 (06/20 1400) BP: (101-127)/(64-82) 109/77 (06/20 1400) SpO2:  [85 %-96 %] 92 % (06/20 1400)    PHYSICAL EXAM:  General: awake  cooperative, no distress, ill, NG tune - has coffee ground fluid Lungs: b/l air entry Decreased bases Heart: tachycardia Abdomen: some distension BS sluggish Extremities: atraumatic, no cyanosis. No edema. No clubbing Skin: No rashes or lesions. Or bruising Lymph: Cervical, supraclavicular normal. Neurologic: Grossly non-focal  Lab Results Recent Labs    06/14/22 0423 06/15/22 0421  WBC 14.2* 22.6*  HGB 9.2* 9.7*  HCT 27.6* 28.5*  NA 135 139  K 4.8 5.0  CL 98 97*  CO2 23 24  BUN 88* 71*  CREATININE 9.65* 9.07*   Liver Panel Recent Labs    06/14/22 0423 06/15/22 0421  PROT 5.3* 6.0*  ALBUMIN 2.7* 2.6*  AST 75* 72*  ALT 74*  64*  ALKPHOS 61 71  BILITOT 0.8 1.1   Microbiology: 06/08/22- BC- NG Studies/Results: DG Abd 1 View  Result Date: 06/14/2022 CLINICAL DATA:  [id number].  NG tube placement EXAM: ABDOMEN - 1 VIEW COMPARISON:  X-ray abdomen 06/14/2022 2:37 p.m. FINDINGS: Interval removal of an enteric tube. Interval placement of a new enteric tube with tip and side port overlying the gastric lumen. Persistent gaseous dilatation of the small and large bowel. Colonic diverticulosis. No radio-opaque calculi or other significant radiographic abnormality are seen. IMPRESSION: 1. Interval removal of an enteric tube. Interval placement of a new enteric tube with tip and side port overlying the gastric lumen. 2. Postop ileus. Electronically Signed   By: Iven Finn M.D.   On: 06/14/2022 16:56   DG Abd 1 View  Result Date: 06/14/2022 CLINICAL DATA:  Ileus EXAM: ABDOMEN - 1 VIEW COMPARISON:  None Available. FINDINGS: Feeding tube tip overlies the fourth portion of the duodenum. There is diffuse mild gaseous distension of bowel in a nonobstructive pattern. IMPRESSION: Mild diffuse gaseous distension foot of bowel, compatible with ileus. Electronically Signed   By: Maurine Simmering M.D.   On: 06/14/2022 15:22   [place] ASCITES (ABDOMEN LIMITED)  Result Date: 06/14/2022 CLINICAL DATA:  Evaluate for ascites EXAM: LIMITED ABDOMEN ULTRASOUND  FOR ASCITES TECHNIQUE: Limited ultrasound survey for ascites was performed in all four abdominal quadrants. COMPARISON:  None Available. FINDINGS: There is no sonographic evidence of ascites in all 4 quadrants of abdomen. IMPRESSION: There is no demonstrable ascites. Electronically Signed   By: Elmer Picker M.D.   On: 06/14/2022 08:57   DG Chest Port 1 View  Result Date: 06/14/2022 CLINICAL DATA:  Shortness of breath. EXAM: PORTABLE CHEST 1 VIEW COMPARISON:  June 11, 2022. FINDINGS: Stable cardiomediastinal silhouette. Feeding tube is seen entering stomach. Hypoinflation of the lungs is noted  with mild bibasilar subsegmental atelectasis. Minimal left pleural effusion is noted. Bony thorax is unremarkable. IMPRESSION: Hypoinflation of the lungs is noted with mild bibasilar subsegmental atelectasis. Minimal left pleural effusion. Electronically Signed   By: Marijo Conception M.D.   On: 06/14/2022 08:11     Assessment/Plan:  Acute pancreatitis with necrosis Peripancreatic edema Ascites Portal vein thrombosis Rt hepatic microabscesses  Coffee ground fluid in NG tube Ileus Leucocytosis- could be due to GI bleed, Ileus Continue zosyn  Encephalopathy  Abnormal lfts improving  AKI- on dialysis  Discussed the management with care team

## 2022-06-15 NOTE — Progress Notes (Signed)
\ PROGRESS NOTE    Xavier Esparza  PFX:[id number] DOB: 30-Sep-1946 DOA: 06/08/2022 PCP: Tonia Ghent, MD    Brief Narrative:  76 y.o. male with medical history significant for DM, BPH, history of prostate cancer, kidney stones status post lithotripsy, negative colonoscopy 2022 (3 benign polyps), who presents to the ED by EMS following a syncopal episode at home while walking to the bathroom.  Wife said she heard a loud thud and went to find him on the floor.  She said he was awake when she got there but he could not help himself up and she called 911.  Patient states he was in his usual state of health but about 2 weeks prior he saw a little blood in his urine similar to when he had his kidney stone.  He saw his urologist yesterday who gave him a prescription for Levaquin.  He otherwise denied feeling unwell previous to the episode.  He denied chest pain, shortness of breath, headache or visual disturbance, one-sided numbness weakness or tingling, nausea or vomiting, abdominal pain, urinary frequency or burning or diarrhea.[facility] reported BP 76/45 and he was treated with IM epinephrine and a 400 mL fluid bolus.  He was awake and alert x4 by arrival.  After his arrival in the emergency room he developed abdominal pain and vomiting.   Initial CT abdomen pelvis with moderate acute pancreatitis no evidence of pancreatic necrosis or pseudocyst.  Follow-up MRI abdomen concerning for possible choledocholithiasis with evidence of necrotizing pancreatitis.  Case discussed with GI Dr. Marius Ditch at [facility].   There was mention of possible portal vein thrombosis.  Heparin GTT was initially started however patient developed suspected acute hematuria so heparin GTT was stopped.  VQ scan ordered and perfusion component will be completed prior to patient transfer.   6/15: Clinical status overall unchanged.  Patient remains with abdominal pain associated with nausea.  Hemodynamically stable.  Mild elevation in white count.   Lipase and LFTs have improved over interval.  Discussed case with GI and general surgery.  Likely that patient's presentation is secondary to necrotizing pancreatitis.  Currently as there is no CBD stone and there is no strong indication for transfer for ERCP.  From a surgical standpoint no intervention is recommended.  There is nothing from an interventional radiology standpoint to drain.  As such we will discontinue transfer and keep patient in [facility].  6/16: No appreciable change in clinical status.  LFTs are downtrending.  White count slightly downtrending.  No fevers noted over interval.  Increasing abdominal distention noted.  Ascites and peripancreatic fluid seen on repeat CAT scan today.  Discussed case with PCCM, nephrology, GI.  Will attempt postpyloric feedings.  Kidney function continues to deteriorate.  Nephrology will initiate hemodialysis today.  6/17: No change in clinical status.  White count remains minimally elevated.  No fevers over interval.  Remains with abdominal distention.  Started HD yesterday.  We will run another round of HD today.  6/18: No appreciable clinical changes.  White blood cell count slightly downtrending.  Creatinine worsened despite 2 rounds of hemodialysis.  Remains with hematuria despite stopping heparin 2 days ago.  6/19: Increasing abd distention.  KUB consistent with ileus.  [place] negative for ascites  6/20: Postpyloric Dobbhoff discontinued.  NGT placed.  Tip in stomach. Set to low intermittent suction.  Rectal tube in place.  Abdominal distention improved.  White blood cell count increased to 22   Assessment & Plan:   Principal Problem:  Severe sepsis (HCC) Active Problems:   Acute pancreatitis   Liver mass, right lobe, abscess vs mass   Portal vein thrombosis   Syncope   AKI (acute kidney injury) ([place])   HTN (hypertension)   Diabetes mellitus without complication (HCC)   BPH (benign prostatic hyperplasia)   Nephrolithiasis   History of prostate  cancer   Necrotizing pancreatitis  Severe necrotizing pancreatitis Portal vein thrombosis Patient presented with intractable nausea and vomiting.  Ill-defined heterogeneous mass in the liver.  MRI and MRCP revealed severe acute necrotizing pancreatitis with associated portal venous thrombosis.  No evidence of CBD stone.  Microabscess noted.  Plan: - Continue supportive management.  Antiemetics and pain medications as necessary - No IV fluids - Fluid management with hemodialysis - Tube feeds currently on hold in setting of ileus - RD following, can likely restart trickle feeds via NGT in 24 hours - If patient does not tolerate related to pain may need to return to IR for replacement of image guided postpyloric tube - Per latest AGA recommendations gastric versus jejunal feedings are equivalent in the setting of a severe acute pancreatitis -Continue holding heparin GTT.  Hemoglobin may have stabilized so consider restarting anticoagulation within 24 to 48 hours -Likely will repeat imaging today to rule out pseudocyst versus pancreatic abscess -Prognosis remain guarded  Acute ileus In the setting of severe acute pancreatitis and IV narcotic administration Ideally would benefit from movement, but limited by pain Dobbhoff discontinued NGT in place, set to low intermittent suction Rectal tube in place Plan: Continue NGT to LIS Continue Flexi-Seal Continue holding tube feeds Serial abdominal examinations General surgery aware of ileus, will follow peripherally  Acute kidney injury progressing Significant and in the setting of aggressive fluid resuscitation.  AKI likely secondary to ATN in the setting of severe acute pancreatitis Vas-Cath placed and initiated on HD 6/16 Status post inpatient HD 6/16, 6/17, 6/20 Plan: HD today  Syncopal event Suspect secondary to hypotension versus vasovagal event PE ruled out, VQ scan low probability Continue telemetry  monitoring  Hematuria History of nephrolithiasis History history of BPH History of prostate cancer No evidence of urinary retention despite worsening kidney function Continue Foley catheter  Diabetes mellitus without complication Sliding-scale coverage  DVT prophylaxis: SQ heparin Code Status: Full Family Communication: Wife at bedside 6/15, 6/17, 6/19, 6/20 Disposition Plan: Status is: Inpatient Remains inpatient appropriate because: Severe acute necrotizing pancreatitis  Level of care: Stepdown  Consultants:  General surgery ID Nephrology  Procedures:  None  Antimicrobials: Zosyn   Subjective: Patient seen and examined.  Lying in bed.  Appears uncomfortable.  Mentating clearly.  Objective: Vitals:   06/15/22 0529 06/15/22 0600 06/15/22 0700 06/15/22 0800  BP: 127/81 124/80 126/66 118/70  Pulse: 94 95 92 93  Resp: (!) 23 (!) '23 20 20  '$ Temp: 99.1 F (37.3 C)     TempSrc: Oral     SpO2: 93% 90%  90%  Weight:      Height:        Intake/Output Summary (Last 24 hours) at 06/15/2022 1126 Last data filed at 06/15/2022 0900 Gross per 24 hour  Intake 436.19 ml  Output 3077 ml  Net -2640.81 ml   Filed Weights   06/14/22 0500 06/14/22 1020 06/14/22 1403  Weight: 105.7 kg 105.7 kg 105 kg    Examination:  General exam: Appears uncomfortable Respiratory system: Bibasilar crackles.  Normal work of breathing.  Room air Cardiovascular system: Tachycardic, reg rhythm, no murmur Gastrointestinal system: Distended, right  lower quadrant tenderness Central nervous system: Alert and oriented. No focal neurological deficits. Extremities: Symmetric 5 x 5 power. Skin: No rashes, lesions or ulcers Psychiatry: Judgement and insight appear normal. Mood & affect flattened.     Data Reviewed: I have personally reviewed following labs and imaging studies  CBC: Recent Labs  Lab 06/11/22 0419 06/12/22 0449 06/13/22 0450 06/14/22 0423 06/15/22 0421  WBC 13.3* 13.8*  12.7* 14.2* 22.6*  NEUTROABS 11.2* 11.5* 10.1* 12.1* 19.7*  HGB 12.9* 11.2* 10.6* 9.2* 9.7*  HCT 38.8* 33.6* 31.6* 27.6* 28.5*  MCV 82.4 81.2 81.9 79.8* 79.4*  PLT 157 159 146* 141* 381   Basic Metabolic Panel: Recent Labs  Lab 06/10/22 1700 06/11/22 0419 06/11/22 1147 06/12/22 0449 06/13/22 0450 06/14/22 0423 06/15/22 0421  NA  --    < > 137 136 136 135 139  K  --    < > 4.2 4.2 4.3 4.8 5.0  CL  --    < > 99 100 99 98 97*  CO2  --    < > '25 25 24 23 24  '$ GLUCOSE  --    < > 131* 156* 182* 210* 153*  BUN  --    < > 85* 80* 83* 88* 71*  CREATININE  --    < > 6.99* 6.76* 7.54* 9.65* 9.07*  CALCIUM  --    < > <4.0* 4.0* 4.2* 4.2* 5.3*  MG  --   --   --  1.8 2.1 2.2  --   PHOS 3.4  --   --  4.0 5.0* 6.8*  --    < > = values in this interval not displayed.   GFR: Estimated Creatinine Clearance: 8.5 mL/min (A) (by C-G formula based on SCr of 9.07 mg/dL (H)). Liver Function Tests: Recent Labs  Lab 06/11/22 0419 06/12/22 0449 06/13/22 0450 06/14/22 0423 06/15/22 0421  AST 262* 162* 106* 75* 72*  ALT 218* 148* 105* 74* 64*  ALKPHOS 41 51 62 61 71  BILITOT 1.5* 0.9 0.7 0.8 1.1  PROT 5.4* 5.4* 5.5* 5.3* 6.0*  ALBUMIN 2.7* 2.4* 2.3* 2.7* 2.6*   Recent Labs  Lab 06/08/22 1810 06/09/22 0415 06/10/22 0532 06/12/22 0449  LIPASE 1,633* 1,741* 688* 102*   Recent Labs  Lab 06/10/22 1900  AMMONIA 13   Coagulation Profile: Recent Labs  Lab 06/08/22 2028 06/12/22 0449  INR 1.1 1.5*   Cardiac Enzymes: Recent Labs  Lab 06/10/22 1618 06/11/22 0419  CKTOTAL 1,611* 1,740*   BNP (last 3 results) No results for input(s): "PROBNP" in the last 8760 hours. HbA1C: No results for input(s): "HGBA1C" in the last 72 hours. CBG: Recent Labs  Lab 06/14/22 1948 06/14/22 2324 06/15/22 0351 06/15/22 0738 06/15/22 1112  GLUCAP 134* 152* 139* 149* 157*   Lipid Profile: No results for input(s): "CHOL", "HDL", "LDLCALC", "TRIG", "CHOLHDL", "LDLDIRECT" in the last 72  hours.  Thyroid Function Tests: No results for input(s): "TSH", "T4TOTAL", "FREET4", "T3FREE", "THYROIDAB" in the last 72 hours. Anemia Panel: No results for input(s): "VITAMINB12", "FOLATE", "FERRITIN", "TIBC", "IRON", "RETICCTPCT" in the last 72 hours. Sepsis Labs: Recent Labs  Lab 06/09/22 0415 06/10/22 0532 06/10/22 0938 06/10/22 1529 06/10/22 1813 06/11/22 1544  PROCALCITON 6.81 19.36  --   --   --   --   LATICACIDVEN  --  2.9* 3.5* 4.0* 2.4* 1.7    Recent Results (from the past 240 hour(s))  Blood culture (routine x 2)     Status: None  Collection Time: 06/08/22  1:33 PM   Specimen: BLOOD  Result Value Ref Range Status   Specimen Description BLOOD LEFT HAND  Final   Special Requests   Final    BOTTLES DRAWN AEROBIC AND ANAEROBIC Blood Culture results may not be optimal due to an inadequate volume of blood received in culture bottles   Culture   Final    NO GROWTH 5 DAYS Performed at [facility], [address] [place], [place] [address]    Report Status 06/13/2022 FINAL  Final  Blood culture (routine x 2)     Status: None   Collection Time: 06/08/22  2:15 PM   Specimen: BLOOD  Result Value Ref Range Status   Specimen Description BLOOD BLOOD LEFT WRIST  Final   Special Requests   Final    BOTTLES DRAWN AEROBIC AND ANAEROBIC Blood Culture results may not be optimal due to an inadequate volume of blood received in culture bottles   Culture   Final    NO GROWTH 5 DAYS Performed at [facility], [address], [place], [place] [address]    Report Status 06/13/2022 FINAL  Final  MRSA Next Gen by PCR, Nasal     Status: None   Collection Time: 06/10/22  4:14 PM   Specimen: Nasal Mucosa; Nasal Swab  Result Value Ref Range Status   MRSA by PCR Next Gen NOT DETECTED NOT DETECTED Final    Comment: (NOTE) The GeneXpert MRSA Assay (FDA approved for NASAL specimens only), is one component of a comprehensive MRSA colonization surveillance program. It  is not intended to diagnose MRSA infection nor to guide or monitor treatment for MRSA infections. Test performance is not FDA approved in patients less than 4 years old. Performed at [facility], [address] [place], [place] [address]   SARS Coronavirus 2 by RT PCR (hospital order, performed in [facility] hospital lab) *cepheid single result test* Anterior Nasal Swab     Status: None   Collection Time: 06/10/22  5:17 PM   Specimen: Anterior Nasal Swab  Result Value Ref Range Status   SARS Coronavirus 2 by RT PCR NEGATIVE NEGATIVE Final    Comment: (NOTE) SARS-CoV-2 target nucleic acids are NOT DETECTED.  The SARS-CoV-2 RNA is generally detectable in upper and lower respiratory specimens during the acute phase of infection. The lowest concentration of SARS-CoV-2 viral copies this assay can detect is 250 copies / mL. A negative result does not preclude SARS-CoV-2 infection and should not be used as the sole basis for treatment or other patient management decisions.  A negative result may occur with improper specimen collection / handling, submission of specimen other than nasopharyngeal swab, presence of viral mutation(s) within the areas targeted by this assay, and inadequate number of viral copies (<250 copies / mL). A negative result must be combined with clinical observations, patient history, and epidemiological information.  Fact Sheet for Patients:   https://www.patel.info/  Fact Sheet for Healthcare Providers: https://hall.com/  This test is not yet approved or  cleared by the [place] FDA and has been authorized for detection and/or diagnosis of SARS-CoV-2 by FDA under an Emergency Use Authorization (EUA).  This EUA will remain in effect (meaning this test can be used) for the duration of the COVID-19 declaration under Section 564(b)(1) of the Act, 21 U.S.C. section 360bbb-3(b)(1), unless the authorization is  terminated or revoked sooner.  Performed at [facility], [address] [place], [place] [address]  Radiology Studies: DG Abd 1 View  Result Date: 06/14/2022 CLINICAL DATA:  [id number].  NG tube placement EXAM: ABDOMEN - 1 VIEW COMPARISON:  X-ray abdomen 06/14/2022 2:37 p.m. FINDINGS: Interval removal of an enteric tube. Interval placement of a new enteric tube with tip and side port overlying the gastric lumen. Persistent gaseous dilatation of the small and large bowel. Colonic diverticulosis. No radio-opaque calculi or other significant radiographic abnormality are seen. IMPRESSION: 1. Interval removal of an enteric tube. Interval placement of a new enteric tube with tip and side port overlying the gastric lumen. 2. Postop ileus. Electronically Signed   By: Iven Finn M.D.   On: 06/14/2022 16:56   DG Abd 1 View  Result Date: 06/14/2022 CLINICAL DATA:  Ileus EXAM: ABDOMEN - 1 VIEW COMPARISON:  None Available. FINDINGS: Feeding tube tip overlies the fourth portion of the duodenum. There is diffuse mild gaseous distension of bowel in a nonobstructive pattern. IMPRESSION: Mild diffuse gaseous distension foot of bowel, compatible with ileus. Electronically Signed   By: Maurine Simmering M.D.   On: 06/14/2022 15:22   [place] ASCITES (ABDOMEN LIMITED)  Result Date: 06/14/2022 CLINICAL DATA:  Evaluate for ascites EXAM: LIMITED ABDOMEN ULTRASOUND FOR ASCITES TECHNIQUE: Limited ultrasound survey for ascites was performed in all four abdominal quadrants. COMPARISON:  None Available. FINDINGS: There is no sonographic evidence of ascites in all 4 quadrants of abdomen. IMPRESSION: There is no demonstrable ascites. Electronically Signed   By: Elmer Picker M.D.   On: 06/14/2022 08:57   DG Chest Port 1 View  Result Date: 06/14/2022 CLINICAL DATA:  Shortness of breath. EXAM: PORTABLE CHEST 1 VIEW COMPARISON:  June 11, 2022. FINDINGS: Stable cardiomediastinal silhouette. Feeding tube is  seen entering stomach. Hypoinflation of the lungs is noted with mild bibasilar subsegmental atelectasis. Minimal left pleural effusion is noted. Bony thorax is unremarkable. IMPRESSION: Hypoinflation of the lungs is noted with mild bibasilar subsegmental atelectasis. Minimal left pleural effusion. Electronically Signed   By: Marijo Conception M.D.   On: 06/14/2022 08:11        Scheduled Meds:  Chlorhexidine Gluconate Cloth  6 each Topical Q0600   feeding supplement (PROSource TF)  45 mL Per Tube BID   free water  30 mL Per Tube Q4H   heparin injection (subcutaneous)  5,000 Units Subcutaneous Q8H   heparin sodium (porcine)       insulin aspart  0-15 Units Subcutaneous Q4H   multivitamin  1 tablet Per Tube QHS   polyethylene glycol  17 g Per Tube Daily   QUEtiapine  100 mg Per Tube QHS   sennosides  5 mL Per Tube BID   Continuous Infusions:  sodium chloride Stopped (06/13/22 2043)   feeding supplement (OSMOLITE 1.5 CAL) Stopped (06/14/22 1801)   piperacillin-tazobactam (ZOSYN)  IV 2.25 g (06/15/22 1042)     LOS: 7 days         Sidney Ace, MD [facility]   If 7PM-7AM, please contact night-coverage  06/15/2022, 11:26 AM

## 2022-06-15 NOTE — Progress Notes (Signed)
Hemodialysis Post Treatment Note:  Tx date:06/15/2022 Tx time: 3 hours Access:left CVC UF Removed: NO fluid removal  Note:  15:13 HD started BP 119/74  16:15 BP trend going down BP 94/65 UF goal decreased to 561m from 1L. NP STroynotified  17:33 BP 72/50 UF off,  flushed 100cc NSS, repeat BP, patient asymptomatic  17:38 BP still low at 80/60, Flushed another 100cc NSS. Repeat BP is 99/61. DR SCandiss Norsenotified with order to give another 2520mof saline- given. Maintained UF  off.  18:15 HD completed. Post HD BP 109/66

## 2022-06-15 NOTE — Evaluation (Signed)
Physical Therapy Evaluation Patient Details Name: Xavier Esparza MRN: [id number] DOB: 09/20/1946 Today's Date: 06/15/2022  History of Present Illness  Patient is a 76 y.o. male with medical history significant for DM, BPH,  prostate cancer, kidney stones status, who presents to the ED after  syncopal episode at home. 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 Patient reports he is independent at baseline without assistive device and lives with his spouse.  The patient was lethargic initially but does have increased alertness with mobility. He required Mod A +2 person for bed mobility and sit to stand transfer. He reports mild dizziness with sitting and standing with vitals monitored throughout. Respiration rate does increase to 38 with activity and decreases to upper 20's with rest break and cues for breathing techniques. Patient has standing tolerance of less than 1 minute with external support required to maintain standing balance using rolling walker. Overall activity tolerance is limited by fatigue. Consider SNF placement at this time based on current functional status. Recommend to continue PT to maximize independence and facilitate return to prior level of function.      Recommendations for follow up therapy are one component of a multi-disciplinary discharge planning process, led by the attending physician.  Recommendations may be updated based on patient status, additional functional criteria and insurance authorization.  Follow Up Recommendations Skilled nursing-short term rehab (<3 hours/day)    Assistance Recommended at Discharge Frequent or constant Supervision/Assistance  Patient can return home with the following  A lot of help with walking and/or transfers;A lot of help with bathing/dressing/bathroom;Help with stairs or ramp for entrance;Assist for  transportation    Equipment Recommendations  (to be determined)  Recommendations for Other Services       Functional Status Assessment Patient has had a recent decline in their functional status and demonstrates the ability to make significant improvements in function in a reasonable and predictable amount of time.     Precautions / Restrictions Precautions Precautions: Fall Precaution Comments: fecal management system, NG tube, temporary left IJ line Restrictions Weight Bearing Restrictions: No      Mobility  Bed Mobility Overal bed mobility: Needs Assistance Bed Mobility: Supine to Sit, Sit to Supine     Supine to sit: Mod assist, +2 for physical assistance Sit to supine: Mod assist, +2 for physical assistance   General bed mobility comments: assistance for trunk and BLE support. increased time and effort required with multi-modal cues needed    Transfers Overall transfer level: Needs assistance Equipment used: Rolling walker (2 wheels) Transfers: Sit to/from Stand Sit to Stand: Mod assist, +2 physical assistance           General transfer comment: cues for hand placement and technique. lifting and lowering assistance provided with standing. mild dizziness is reported during standing but unable to get standing BP    Ambulation/Gait               General Gait Details: not attempted due to poor standing tolerance  Stairs            Wheelchair Mobility    Modified Rankin (Stroke Patients Only)       Balance Overall balance assessment: Needs assistance Sitting-balance support: Feet supported Sitting balance-Leahy Scale: Fair Sitting balance - Comments: no external support required to maintain static sitting. close standy by assistance   Standing balance support: Bilateral upper extremity supported, Reliant on  assistive device for balance Standing balance-Leahy Scale: Poor Standing balance comment: external support required to maintain standing  balance with standing tolerance os less than one minute                             Pertinent Vitals/Pain Pain Assessment Pain Assessment: Faces Faces Pain Scale: Hurts a little bit Pain Location: left abdomen Pain Descriptors / Indicators: Discomfort Pain Intervention(s): Limited activity within patient's tolerance, Monitored during session    Home Living Family/patient expects to be discharged to:: Private residence Living Arrangements: Spouse/significant other Available Help at Discharge: Family Type of Home: House         Home Layout: Able to live on main level with bedroom/bathroom        Prior Function Prior Level of Function : Independent/Modified Independent             Mobility Comments: independent per patient report ADLs Comments: independent     Hand Dominance        Extremity/Trunk Assessment   Upper Extremity Assessment Upper Extremity Assessment: Generalized weakness    Lower Extremity Assessment Lower Extremity Assessment: Generalized weakness       Communication   Communication: No difficulties  Cognition Arousal/Alertness: Lethargic Behavior During Therapy: Flat affect Overall Cognitive Status: No family/caregiver present to determine baseline cognitive functioning Area of Impairment: Following commands, Problem solving                       Following Commands: Follows one step commands with increased time     Problem Solving: Slow processing, Difficulty sequencing, Requires verbal cues, Requires tactile cues General Comments: patient lethargic initially but does have increased alertness with mobility and sitting upright        General Comments General comments (skin integrity, edema, etc.): mild dizziness with sitting and standing. blood pressure monitored during session. initial seated BP 118/72 and after standing was 105/67. respiration rate up to 38 after standing and decreased to the upper 20's at rest.  patient was fatigued with activity    Exercises     Assessment/Plan    PT Assessment Patient needs continued PT services  PT Problem List Decreased strength;Decreased range of motion;Decreased activity tolerance;Decreased balance;Decreased mobility;Decreased cognition;Decreased knowledge of use of DME;Decreased safety awareness;Cardiopulmonary status limiting activity       PT Treatment Interventions DME instruction;Gait training;Stair training;Functional mobility training;Therapeutic activities;Therapeutic exercise;Balance training;Neuromuscular re-education;Cognitive remediation;Patient/family education    PT Goals (Current goals can be found in the Care Plan section)  Acute Rehab PT Goals Patient Stated Goal: to return home PT Goal Formulation: With patient Time For Goal Achievement: 06/29/22 Potential to Achieve Goals: Fair    Frequency Min 2X/week     Co-evaluation               AM-PAC PT "6 Clicks" Mobility  Outcome Measure Help needed turning from your back to your side while in a flat bed without using bedrails?: A Lot Help needed moving from lying on your back to sitting on the side of a flat bed without using bedrails?: Total Help needed moving to and from a bed to a chair (including a wheelchair)?: Total Help needed standing up from a chair using your arms (e.g., wheelchair or bedside chair)?: Total Help needed to walk in hospital room?: Total Help needed climbing 3-5 steps with a railing? : Total 6 Click Score: 7    End of Session  Activity Tolerance: Patient tolerated treatment well Patient left: in bed;with call bell/phone within reach Nurse Communication: Mobility status PT Visit Diagnosis: Unsteadiness on feet (R26.81);Muscle weakness (generalized) (M62.81)    Time: 2956-2130 PT Time Calculation (min) (ACUTE ONLY): 26 min   Charges:   PT Evaluation $PT Eval Moderate Complexity: 1 Mod PT Treatments $Therapeutic Activity: 8-22 mins         Minna Merritts, PT, MPT   Percell Locus 06/15/2022, 11:46 AM

## 2022-06-16 DIAGNOSIS — R55 Syncope and collapse: Secondary | ICD-10-CM | POA: Diagnosis not present

## 2022-06-16 DIAGNOSIS — K922 Gastrointestinal hemorrhage, unspecified: Secondary | ICD-10-CM

## 2022-06-16 DIAGNOSIS — R6520 Severe sepsis without septic shock: Secondary | ICD-10-CM

## 2022-06-16 DIAGNOSIS — D62 Acute posthemorrhagic anemia: Secondary | ICD-10-CM | POA: Diagnosis not present

## 2022-06-16 DIAGNOSIS — K750 Abscess of liver: Secondary | ICD-10-CM | POA: Diagnosis not present

## 2022-06-16 DIAGNOSIS — I81 Portal vein thrombosis: Secondary | ICD-10-CM | POA: Diagnosis not present

## 2022-06-16 DIAGNOSIS — K8591 Acute pancreatitis with uninfected necrosis, unspecified: Secondary | ICD-10-CM | POA: Diagnosis not present

## 2022-06-16 LAB — COMPREHENSIVE METABOLIC PANEL
ALT: 71 U/L — ABNORMAL HIGH (ref 0–44)
AST: 106 U/L — ABNORMAL HIGH (ref 15–41)
Albumin: 2.4 g/dL — ABNORMAL LOW (ref 3.5–5.0)
Alkaline Phosphatase: 75 U/L (ref 38–126)
Anion gap: 19 — ABNORMAL HIGH (ref 5–15)
BUN: 103 mg/dL — ABNORMAL HIGH (ref 8–23)
CO2: 31 mmol/L (ref 22–32)
Calcium: 5.7 mg/dL — CL (ref 8.9–10.3)
Chloride: 92 mmol/L — ABNORMAL LOW (ref 98–111)
Creatinine, Ser: 9.18 mg/dL — ABNORMAL HIGH (ref 0.61–1.24)
GFR, Estimated: 5 mL/min — ABNORMAL LOW (ref >60)
Glucose, Bld: 172 mg/dL — ABNORMAL HIGH (ref 70–99)
Potassium: 4.9 mmol/L (ref 3.5–5.1)
Sodium: 142 mmol/L (ref 135–145)
Total Bilirubin: 0.7 mg/dL (ref 0.3–1.2)
Total Protein: 6.3 g/dL — ABNORMAL LOW (ref 6.5–8.1)

## 2022-06-16 LAB — CBC WITH DIFFERENTIAL/PLATELET
Abs Immature Granulocytes: 0.93 10*3/uL — ABNORMAL HIGH (ref 0.00–0.07)
Basophils Absolute: 0.1 10*3/uL (ref 0.0–0.1)
Basophils Relative: 0 %
Eosinophils Absolute: 0 10*3/uL (ref 0.0–0.5)
Eosinophils Relative: 0 %
HCT: 26.2 % — ABNORMAL LOW (ref 39.0–52.0)
Hemoglobin: 9.1 g/dL — ABNORMAL LOW (ref 13.0–17.0)
Immature Granulocytes: 3 %
Lymphocytes Relative: 3 %
Lymphs Abs: 0.9 10*3/uL (ref 0.7–4.0)
MCH: 27 pg (ref 26.0–34.0)
MCHC: 34.7 g/dL (ref 30.0–36.0)
MCV: 77.7 fL — ABNORMAL LOW (ref 80.0–100.0)
Monocytes Absolute: 1.2 10*3/uL — ABNORMAL HIGH (ref 0.1–1.0)
Monocytes Relative: 4 %
Neutro Abs: 24.5 10*3/uL — ABNORMAL HIGH (ref 1.7–7.7)
Neutrophils Relative %: 90 %
Platelets: 246 10*3/uL (ref 150–400)
RBC: 3.37 MIL/uL — ABNORMAL LOW (ref 4.22–5.81)
RDW: 15.4 % (ref 11.5–15.5)
Smear Review: NORMAL
WBC: 27.7 10*3/uL — ABNORMAL HIGH (ref 4.0–10.5)
nRBC: 0.1 % (ref 0.0–0.2)

## 2022-06-16 LAB — ABO/RH: ABO/RH(D): O POS

## 2022-06-16 LAB — PREPARE RBC (CROSSMATCH)

## 2022-06-16 LAB — MAGNESIUM: Magnesium: 2.6 mg/dL — ABNORMAL HIGH (ref 1.7–2.4)

## 2022-06-16 LAB — GLUCOSE, CAPILLARY
Glucose-Capillary: 140 mg/dL — ABNORMAL HIGH (ref 70–99)
Glucose-Capillary: 147 mg/dL — ABNORMAL HIGH (ref 70–99)
Glucose-Capillary: 148 mg/dL — ABNORMAL HIGH (ref 70–99)
Glucose-Capillary: 156 mg/dL — ABNORMAL HIGH (ref 70–99)
Glucose-Capillary: 162 mg/dL — ABNORMAL HIGH (ref 70–99)
Glucose-Capillary: 162 mg/dL — ABNORMAL HIGH (ref 70–99)

## 2022-06-16 LAB — HEMOGLOBIN: Hemoglobin: 8.2 g/dL — ABNORMAL LOW (ref 13.0–17.0)

## 2022-06-16 MED ORDER — SODIUM CHLORIDE 0.9% IV SOLUTION: Freq: Once | INTRAVENOUS | Status: AC

## 2022-06-16 MED ORDER — SODIUM CHLORIDE 0.9 % IV SOLN
50.0000 ug/h | INTRAVENOUS | Status: DC
  Administered 2022-06-16 – 2022-06-18 (×5): 50 ug/h via INTRAVENOUS
  Filled 2022-06-16 (×7): qty 1

## 2022-06-16 MED ORDER — LACTATED RINGERS IV SOLN: INTRAVENOUS | Status: DC

## 2022-06-16 MED ORDER — ALBUMIN HUMAN 25 % IV SOLN
25.0000 g | Freq: Once | INTRAVENOUS | Status: AC
Start: 2022-06-16 — End: 2022-06-16
  Administered 2022-06-16: 25 g via INTRAVENOUS
  Filled 2022-06-16: qty 100

## 2022-06-16 MED ORDER — FUROSEMIDE 10 MG/ML IJ SOLN
80.0000 mg | Freq: Once | INTRAMUSCULAR | Status: AC
  Administered 2022-06-16: 80 mg via INTRAVENOUS
  Filled 2022-06-16: qty 8

## 2022-06-16 MED ORDER — PANTOPRAZOLE SODIUM 40 MG IV SOLR
40.0000 mg | Freq: Two times a day (BID) | INTRAVENOUS | Status: DC
  Administered 2022-06-19 – 2022-06-29 (×20): 40 mg via INTRAVENOUS
  Filled 2022-06-16 (×20): qty 10

## 2022-06-16 MED ORDER — PANTOPRAZOLE 80MG IVPB - SIMPLE MED
80.0000 mg | Freq: Once | INTRAVENOUS | Status: AC
  Administered 2022-06-16: 80 mg via INTRAVENOUS
  Filled 2022-06-16: qty 100

## 2022-06-16 MED ORDER — OCTREOTIDE LOAD VIA INFUSION
50.0000 ug | Freq: Once | INTRAVENOUS | Status: AC
  Administered 2022-06-16: 50 ug via INTRAVENOUS
  Filled 2022-06-16: qty 25

## 2022-06-16 MED ORDER — CALCIUM GLUCONATE-NACL 1-0.675 GM/50ML-% IV SOLN
1.0000 g | Freq: Once | INTRAVENOUS | Status: AC
Start: 2022-06-16 — End: 2022-06-16
  Administered 2022-06-16: 1000 mg via INTRAVENOUS
  Filled 2022-06-16: qty 50

## 2022-06-16 MED ORDER — PANTOPRAZOLE INFUSION (NEW) - SIMPLE MED
8.0000 mg/h | INTRAVENOUS | Status: AC
  Administered 2022-06-16 – 2022-06-19 (×7): 8 mg/h via INTRAVENOUS
  Filled 2022-06-16 (×7): qty 100

## 2022-06-16 MED ORDER — HEPARIN SODIUM (PORCINE) 1000 UNIT/ML IJ SOLN
INTRAMUSCULAR | Status: AC
  Filled 2022-06-16: qty 10

## 2022-06-16 MED ORDER — SODIUM CHLORIDE 0.9 % IV SOLN: INTRAVENOUS | Status: DC

## 2022-06-16 NOTE — Assessment & Plan Note (Addendum)
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  With low-grade fever on 6/23 and I repeated blood cultures which are negative for 3 days.  Repeat CT scan does not show any abscess on the pancreas but does show fluid collection still in the liver which could be an abscess.  Empirically on meropenem and Diflucan.  Tumor markers ordered including CEA normal range at 0.8 and normal alpha-fetoprotein at less than 1.8.  CA 19-9 within normal range currently on TPN.  Tube feeding held  with ileus.  Hopefully can restart tube feeding soon..  Patient is critically ill.  Appreciate palliative care consultation.

## 2022-06-16 NOTE — Progress Notes (Signed)
[facility]  ROUNDING NOTE   Subjective:  Patient seen and evaluated at bedside in ICU Remains ill-appearing Alert and oriented States he actually feels better today Complains of low back pain Remains n.p.o. with NG tube in place to low wall intermittent suction, large volume dark drainage Complains of dry mouth, weakness No lower extremity edema Foley remains in place   Objective:  Vital signs in last 24 hours:  Temp:  [97.7 F (36.5 C)-100.6 F (38.1 C)] 97.7 F (36.5 C) (06/21 0946) Pulse Rate:  [65-136] 95 (06/21 1018) Resp:  [20-39] 20 (06/21 1018) BP: (72-181)/(50-149) 75/55 (06/21 1015) SpO2:  [90 %-100 %] 96 % (06/21 1018) Weight:  [101.2 kg-103.3 kg] 101.2 kg (06/21 0947)  Weight change: -4.1 kg Filed Weights   06/15/22 1459 06/15/22 1820 06/16/22 0947  Weight: 101.6 kg 103.3 kg 101.2 kg    Intake/Output: I/O last 3 completed shifts: In: 9 [Other:60; IV Piggyback:200] Out: 3431 [Emesis/NG output:3300; Stool:250]   Intake/Output this shift:  Total I/O In: -  Out: 500 [Emesis/NG output:500]  Physical Exam: General: NAD, resting comfortably  Head: Normocephalic, atraumatic. Moist oral mucosal membranes  Eyes: Anicteric  Lungs:  Basilar crackles, normal effort, room air  Heart: Regular rate and rhythm  Abdomen:  Firm, distended, mild diffuse tenderness, BS not present  Extremities: No peripheral edema.  Neurologic: Alert, able to answer simple questions  Skin: Warm, dry  Access: Left IJ dialysis catheter 06/01/2022.  Donell Beers.    Basic Metabolic Panel: Recent Labs  Lab 06/10/22 1700 06/11/22 0419 06/12/22 0449 06/13/22 0450 06/14/22 0423 06/15/22 0421 06/16/22 0628  NA  --    < > 136 136 135 139 142  K  --    < > 4.2 4.3 4.8 5.0 4.9  CL  --    < > 100 99 98 97* 92*  CO2  --    < > '25 24 23 24 31  '$ GLUCOSE  --    < > 156* 182* 210* 153* 172*  BUN  --    < > 80* 83* 88* 71* 103*  CREATININE  --    < > 6.76* 7.54* 9.65* 9.07*  9.18*  CALCIUM  --    < > 4.0* 4.2* 4.2* 5.3* 5.7*  MG  --   --  1.8 2.1 2.2  --  2.6*  PHOS 3.4  --  4.0 5.0* 6.8*  --   --    < > = values in this interval not displayed.     Liver Function Tests: Recent Labs  Lab 06/12/22 0449 06/13/22 0450 06/14/22 0423 06/15/22 0421 06/16/22 0628  AST 162* 106* 75* 72* 106*  ALT 148* 105* 74* 64* 71*  ALKPHOS 51 62 61 71 75  BILITOT 0.9 0.7 0.8 1.1 0.7  PROT 5.4* 5.5* 5.3* 6.0* 6.3*  ALBUMIN 2.4* 2.3* 2.7* 2.6* 2.4*    Recent Labs  Lab 06/10/22 0532 06/12/22 0449  LIPASE 688* 102*    Recent Labs  Lab 06/10/22 1900  AMMONIA 13     CBC: Recent Labs  Lab 06/12/22 0449 06/13/22 0450 06/14/22 0423 06/15/22 0421 06/16/22 0628  WBC 13.8* 12.7* 14.2* 22.6* 27.7*  NEUTROABS 11.5* 10.1* 12.1* 19.7* 24.5*  HGB 11.2* 10.6* 9.2* 9.7* 9.1*  HCT 33.6* 31.6* 27.6* 28.5* 26.2*  MCV 81.2 81.9 79.8* 79.4* 77.7*  PLT 159 146* 141* 185 246     Cardiac Enzymes: Recent Labs  Lab 06/10/22 1618 06/11/22 0419  CKTOTAL 1,611* 1,740*  BNP: Invalid input(s): "POCBNP"  CBG: Recent Labs  Lab 06/15/22 1620 06/15/22 1944 06/15/22 2332 06/16/22 0359 06/16/22 0738  GLUCAP 132* 146* 180* 156* 147*     Microbiology: Results for orders placed or performed during the hospital encounter of 06/08/22  Blood culture (routine x 2)     Status: None   Collection Time: 06/08/22  1:33 PM   Specimen: BLOOD  Result Value Ref Range Status   Specimen Description BLOOD LEFT HAND  Final   Special Requests   Final    BOTTLES DRAWN AEROBIC AND ANAEROBIC Blood Culture results may not be optimal due to an inadequate volume of blood received in culture bottles   Culture   Final    NO GROWTH 5 DAYS Performed at [facility], [address] [place], [place] [address]    Report Status 06/13/2022 FINAL  Final  Blood culture (routine x 2)     Status: None   Collection Time: 06/08/22  2:15 PM   Specimen: BLOOD  Result Value Ref Range  Status   Specimen Description BLOOD BLOOD LEFT WRIST  Final   Special Requests   Final    BOTTLES DRAWN AEROBIC AND ANAEROBIC Blood Culture results may not be optimal due to an inadequate volume of blood received in culture bottles   Culture   Final    NO GROWTH 5 DAYS Performed at [facility], [address], [place] [address]    Report Status 06/13/2022 FINAL  Final  MRSA Next Gen by PCR, Nasal     Status: None   Collection Time: 06/10/22  4:14 PM   Specimen: Nasal Mucosa; Nasal Swab  Result Value Ref Range Status   MRSA by PCR Next Gen NOT DETECTED NOT DETECTED Final    Comment: (NOTE) The GeneXpert MRSA Assay (FDA approved for NASAL specimens only), is one component of a comprehensive MRSA colonization surveillance program. It is not intended to diagnose MRSA infection nor to guide or monitor treatment for MRSA infections. Test performance is not FDA approved in patients less than 30 years old. Performed at [facility], [address] [place], [place] [address]   SARS Coronavirus 2 by RT PCR (hospital order, performed in [facility] hospital lab) *cepheid single result test* Anterior Nasal Swab     Status: None   Collection Time: 06/10/22  5:17 PM   Specimen: Anterior Nasal Swab  Result Value Ref Range Status   SARS Coronavirus 2 by RT PCR NEGATIVE NEGATIVE Final    Comment: (NOTE) SARS-CoV-2 target nucleic acids are NOT DETECTED.  The SARS-CoV-2 RNA is generally detectable in upper and lower respiratory specimens during the acute phase of infection. The lowest concentration of SARS-CoV-2 viral copies this assay can detect is 250 copies / mL. A negative result does not preclude SARS-CoV-2 infection and should not be used as the sole basis for treatment or other patient management decisions.  A negative result may occur with improper specimen collection / handling, submission of specimen other than nasopharyngeal swab, presence of viral  mutation(s) within the areas targeted by this assay, and inadequate number of viral copies (<250 copies / mL). A negative result must be combined with clinical observations, patient history, and epidemiological information.  Fact Sheet for Patients:   https://www.patel.info/  Fact Sheet for Healthcare Providers: https://hall.com/  This test is not yet approved or  cleared by the [place] FDA and has been authorized for detection and/or diagnosis of SARS-CoV-2 by FDA under  an Emergency Use Authorization (EUA).  This EUA will remain in effect (meaning this test can be used) for the duration of the COVID-19 declaration under Section 564(b)(1) of the Act, 21 U.S.C. section 360bbb-3(b)(1), unless the authorization is terminated or revoked sooner.  Performed at [facility], [address] [place], [place] [address]     Coagulation Studies: No results for input(s): "LABPROT", "INR" in the last 72 hours.   Urinalysis: No results for input(s): "COLORURINE", "LABSPEC", "PHURINE", "GLUCOSEU", "HGBUR", "BILIRUBINUR", "KETONESUR", "PROTEINUR", "UROBILINOGEN", "NITRITE", "LEUKOCYTESUR" in the last 72 hours.  Invalid input(s): "APPERANCEUR"     Imaging: DG Abd 1 View  Result Date: 06/14/2022 CLINICAL DATA:  [id number].  NG tube placement EXAM: ABDOMEN - 1 VIEW COMPARISON:  X-ray abdomen 06/14/2022 2:37 p.m. FINDINGS: Interval removal of an enteric tube. Interval placement of a new enteric tube with tip and side port overlying the gastric lumen. Persistent gaseous dilatation of the small and large bowel. Colonic diverticulosis. No radio-opaque calculi or other significant radiographic abnormality are seen. IMPRESSION: 1. Interval removal of an enteric tube. Interval placement of a new enteric tube with tip and side port overlying the gastric lumen. 2. Postop ileus. Electronically Signed   By: Iven Finn M.D.   On: 06/14/2022 16:56    DG Abd 1 View  Result Date: 06/14/2022 CLINICAL DATA:  Ileus EXAM: ABDOMEN - 1 VIEW COMPARISON:  None Available. FINDINGS: Feeding tube tip overlies the fourth portion of the duodenum. There is diffuse mild gaseous distension of bowel in a nonobstructive pattern. IMPRESSION: Mild diffuse gaseous distension foot of bowel, compatible with ileus. Electronically Signed   By: Maurine Simmering M.D.   On: 06/14/2022 15:22     Medications:    sodium chloride Stopped (06/13/22 2043)   lactated ringers 75 mL/hr at 06/16/22 0914   pantoprazole 8 mg/hr (06/16/22 0952)   piperacillin-tazobactam (ZOSYN)  IV Stopped (06/16/22 0259)    Chlorhexidine Gluconate Cloth  6 each Topical Q0600   free water  30 mL Per Tube Q4H   heparin sodium (porcine)       insulin aspart  0-15 Units Subcutaneous Q4H   multivitamin  1 tablet Per Tube QHS   [START ON 06/19/2022] pantoprazole  40 mg Intravenous Q12H   polyethylene glycol  17 g Per Tube Daily   QUEtiapine  100 mg Per Tube QHS   sennosides  5 mL Per Tube BID   sodium chloride, acetaminophen **OR** acetaminophen, alteplase, bisacodyl, guaiFENesin-dextromethorphan, heparin, heparin sodium (porcine), HYDROmorphone (DILAUDID) injection, hydrOXYzine, ipratropium-albuterol, lidocaine (PF), lidocaine-prilocaine, metoCLOPramide (REGLAN) injection, mouth rinse, oxyCODONE, pentafluoroprop-tetrafluoroeth, polyethylene glycol, prochlorperazine  Assessment/ Plan:  Mr. MYSON LEVI is a 76 y.o.  male with past medical history consistent of prostate cancer, diabetes, recurrent kidney stones with lithotripsy and BPH, who was admitted to [facility] on 06/08/2022 for Portal vein thrombosis [I81] Lactic acidosis [E87.20] Necrotizing pancreatitis [K85.91] Liver mass [R16.0] Syncope, unspecified syncope type [R55] Leukocytosis, unspecified type [D72.829] Acute pancreatitis, unspecified complication status, unspecified pancreatitis type [K85.90]   Acute kidney injury with gross hematuria  likely secondary to severe illness and IV contrast exposure.  Normal kidney function prior to admission.  IV contrast exposure on 06/08/2022 and 06/09/2022.  Renal ultrasound negative for obstruction.  Urinalysis shows hematuria and mild proteinuria.   ?  Gross hematuria related to Foley catheter.  Patient has history of prostate cancer.   Received first dialysis treatment on June 16.   -Received dialysis treatment yesterday, no UF due to low blood  pressure. -BUN and creatinine continue to decline, despite dialysis yesterday.  Will provide additional dialysis treatment today.  No UF.  Patient given normal saline 200 mL bolus during early treatment due to decrease in blood pressure.  May require midodrine or albumin for BP support.  We will consider CRRT if pressors are required. -We will continue to monitor patient daily to determine need for dialysis  Lab Results  Component Value Date   CREATININE 9.18 (H) 06/16/2022   CREATININE 9.07 (H) 06/15/2022   CREATININE 9.65 (H) 06/14/2022    Intake/Output Summary (Last 24 hours) at 06/16/2022 1022 Last data filed at 06/16/2022 0917 Gross per 24 hour  Intake 180 ml  Output 2531 ml  Net -2351 ml    Acute pancreatitis, severe necrotizing.  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   Remains NPO.  Supportive care with antiemetics and pain control.  Receiving antibiotic therapy managed by primary team.  Will order IV fluids, lactated Ringer's at 75 mL/h for hydration.   Diabetes mellitus type 2 without complication.Home regimen includes metformin.  Hemoglobin A1c 6.2 on 01/08/2022.  -Agree with holding metformin. -Improved glucose control. SSI managed by primary team  Acute ileus, receiving IV narcotics for severe acute pancreatitis. Tube feeds stopped and NGT placed to LWIS. Rectal tube placed  Large volume of dark drainage noted from NG tube .   LOS: 8   6/21/202310:22 AM

## 2022-06-16 NOTE — Progress Notes (Signed)
PT Cancellation Note  Patient Details Name: Xavier Esparza MRN: [id number] DOB: 06/02/1946   Cancelled Treatment:     Pt has had elevated MEWs throughout the day and is currently starting blood transfusion. PT will return tomorrow and continue to follow and progress as able per current POC.    Willette Pa 06/16/2022, 3:11 PM

## 2022-06-16 NOTE — Progress Notes (Signed)
Called wife to inform her of plan to do EGD tomorrow.  Was not sure if patient told her about that.  She had not been notified and I explained the procedure and why the Doctor wanted to have procedure done.

## 2022-06-16 NOTE — Progress Notes (Signed)
Xavier Esparza , MD [address] [place], [place], [place], [address] [address], [place], [place], [place], [address] Phone: [phone]  Fax: [phone]   Xavier Esparza is being followed for coffee-ground material from NG tube  Subjective: Earlier yesterday and this morning had coffee ground material from NG tube which has stopped, denies any abdominal pain or discomfort.    Objective: Vital signs in last 24 hours: Vitals:   06/16/22 0700 06/16/22 0730 06/16/22 0800 06/16/22 0830  BP: (!) '89/62 98/68 92/73 '$ 101/71  Pulse:    92  Resp: (!) 36 (!) 27 (!) 39 (!) 36  Temp:      TempSrc:      SpO2:    95%  Weight:      Height:       Weight change: -4.1 kg  Intake/Output Summary (Last 24 hours) at 06/16/2022 7425 Last data filed at 06/16/2022 9563 Gross per 24 hour  Intake 180 ml  Output 2531 ml  Net -2351 ml     Exam: Heart:: Regular rate and rhythm, S1S2 present, or without murmur or extra heart sounds Lungs: normal, clear to auscultation, and clear to auscultation and percussion Abdomen: soft, nontender, normal bowel sounds   Lab Results: '@LABTEST2'$ @ Micro Results: Recent Results (from the past 240 hour(s))  Blood culture (routine x 2)     Status: None   Collection Time: 06/08/22  1:33 PM   Specimen: BLOOD  Result Value Ref Range Status   Specimen Description BLOOD LEFT HAND  Final   Special Requests   Final    BOTTLES DRAWN AEROBIC AND ANAEROBIC Blood Culture results may not be optimal due to an inadequate volume of blood received in culture bottles   Culture   Final    NO GROWTH 5 DAYS Performed at [facility], [address] [place], [place] [address]    Report Status 06/13/2022 FINAL  Final  Blood culture (routine x 2)     Status: None   Collection Time: 06/08/22  2:15 PM   Specimen: BLOOD  Result Value Ref Range Status   Specimen Description BLOOD BLOOD LEFT WRIST  Final   Special Requests   Final    BOTTLES DRAWN AEROBIC AND  ANAEROBIC Blood Culture results may not be optimal due to an inadequate volume of blood received in culture bottles   Culture   Final    NO GROWTH 5 DAYS Performed at [facility], [address] [place], [place] [address]    Report Status 06/13/2022 FINAL  Final  MRSA Next Gen by PCR, Nasal     Status: None   Collection Time: 06/10/22  4:14 PM   Specimen: Nasal Mucosa; Nasal Swab  Result Value Ref Range Status   MRSA by PCR Next Gen NOT DETECTED NOT DETECTED Final    Comment: (NOTE) The GeneXpert MRSA Assay (FDA approved for NASAL specimens only), is one component of a comprehensive MRSA colonization surveillance program. It is not intended to diagnose MRSA infection nor to guide or monitor treatment for MRSA infections. Test performance is not FDA approved in patients less than 61 years old. Performed at [facility], [address] [place], [place] [address]   SARS Coronavirus 2 by RT PCR (hospital order, performed in [facility] hospital lab) *cepheid single result test* Anterior Nasal Swab     Status: None   Collection Time: 06/10/22  5:17 PM   Specimen: Anterior Nasal Swab  Result Value Ref Range  Status   SARS Coronavirus 2 by RT PCR NEGATIVE NEGATIVE Final    Comment: (NOTE) SARS-CoV-2 target nucleic acids are NOT DETECTED.  The SARS-CoV-2 RNA is generally detectable in upper and lower respiratory specimens during the acute phase of infection. The lowest concentration of SARS-CoV-2 viral copies this assay can detect is 250 copies / mL. A negative result does not preclude SARS-CoV-2 infection and should not be used as the sole basis for treatment or other patient management decisions.  A negative result may occur with improper specimen collection / handling, submission of specimen other than nasopharyngeal swab, presence of viral mutation(s) within the areas targeted by this assay, and inadequate number of viral copies (<250 copies / mL). A negative  result must be combined with clinical observations, patient history, and epidemiological information.  Fact Sheet for Patients:   https://www.patel.info/  Fact Sheet for Healthcare Providers: https://hall.com/  This test is not yet approved or  cleared by the [place] FDA and has been authorized for detection and/or diagnosis of SARS-CoV-2 by FDA under an Emergency Use Authorization (EUA).  This EUA will remain in effect (meaning this test can be used) for the duration of the COVID-19 declaration under Section 564(b)(1) of the Act, 21 U.S.C. section 360bbb-3(b)(1), unless the authorization is terminated or revoked sooner.  Performed at [facility], [address] [place], [place] [address]    Studies/Results: [place] Abd 1 View  Result Date: 06/14/2022 CLINICAL DATA:  [id number].  NG tube placement EXAM: ABDOMEN - 1 VIEW COMPARISON:  X-ray abdomen 06/14/2022 2:37 p.m. FINDINGS: Interval removal of an enteric tube. Interval placement of a new enteric tube with tip and side port overlying the gastric lumen. Persistent gaseous dilatation of the small and large bowel. Colonic diverticulosis. No radio-opaque calculi or other significant radiographic abnormality are seen. IMPRESSION: 1. Interval removal of an enteric tube. Interval placement of a new enteric tube with tip and side port overlying the gastric lumen. 2. Postop ileus. Electronically Signed   By: Iven Finn M.D.   On: 06/14/2022 16:56   DG Abd 1 View  Result Date: 06/14/2022 CLINICAL DATA:  Ileus EXAM: ABDOMEN - 1 VIEW COMPARISON:  None Available. FINDINGS: Feeding tube tip overlies the fourth portion of the duodenum. There is diffuse mild gaseous distension of bowel in a nonobstructive pattern. IMPRESSION: Mild diffuse gaseous distension foot of bowel, compatible with ileus. Electronically Signed   By: Maurine Simmering M.D.   On: 06/14/2022 15:22   Medications: I have reviewed the  patient's current medications. Scheduled Meds:  Chlorhexidine Gluconate Cloth  6 each Topical Q0600   free water  30 mL Per Tube Q4H   heparin sodium (porcine)       insulin aspart  0-15 Units Subcutaneous Q4H   multivitamin  1 tablet Per Tube QHS   [START ON 06/19/2022] pantoprazole  40 mg Intravenous Q12H   polyethylene glycol  17 g Per Tube Daily   QUEtiapine  100 mg Per Tube QHS   sennosides  5 mL Per Tube BID   Continuous Infusions:  sodium chloride Stopped (06/13/22 2043)   calcium gluconate 1,000 mg (06/16/22 0917)   lactated ringers 75 mL/hr at 06/16/22 0914   pantoprazole 80 mg (06/16/22 0928)   pantoprazole     piperacillin-tazobactam (ZOSYN)  IV Stopped (06/16/22 0259)   PRN Meds:.sodium chloride, acetaminophen **OR** acetaminophen, alteplase, bisacodyl, guaiFENesin-dextromethorphan, heparin, heparin sodium (porcine), HYDROmorphone (DILAUDID) injection, hydrOXYzine, ipratropium-albuterol, lidocaine (PF), lidocaine-prilocaine, metoCLOPramide (REGLAN) injection, mouth rinse, oxyCODONE,  pentafluoroprop-tetrafluoroeth, polyethylene glycol, prochlorperazine   Assessment: Principal Problem:   Severe sepsis (HCC) Active Problems:   Diabetes mellitus without complication (HCC)   BPH (benign prostatic hyperplasia)   Nephrolithiasis   Acute pancreatitis   Liver mass, right lobe, abscess vs mass   Portal vein thrombosis   AKI (acute kidney injury) ([place])   Syncope   History of prostate cancer   HTN (hypertension)   Necrotizing pancreatitis  Amado Nash 76 y.o. male with history of prostate cancer was admitted with pancreatitis follow-up MRI revealed necrotizing pancreatitis, portal vein thrombosis and liver lesions suggestive of microabscesses, subsequently developed AKI.  On anticoagulation.  In having tube feeds.  If there is concern for coffee-ground emesis and NG tube.  This morning I have been asked to see him because he has had over 1000 cc of coffee-ground emesis  aspirated.  Hemoglobin 9.1 g stable for the past 2 days.  Creatinine 9.1 with a BUN of 108 LFTs improving.  Differentials for the coffee-ground emesis include trauma from NG tube versus stress ulcers versus duodenitis from the pancreatitis other complications could include related to portal vein thrombosis and development of varices, splenic vein thrombosis is a complication of acute pancreatitis which can also lead to gastric varices.  Plan: 1.  2 large IV bore cannulas, monitor CBC and transfuse as needed, IV PPI, recommend octreotide  2.  Once stabilized we will plan to perform an EGD, I have discussed with Dr. Leslye Peer and blood pressures are on the lower side today may receive some blood transfusion and we will plan for an EGD tomorrow  I have discussed alternative options, risks & benefits,  which include, but are not limited to, bleeding, infection, perforation,respiratory complication & drug reaction.  The patient agrees with this plan & written consent will be obtained.     LOS: 8 days   Xavier Bellows, MD 06/16/2022, 9:34 AM

## 2022-06-16 NOTE — Progress Notes (Signed)
  Progress Note   Patient: Xavier Esparza OBS:[id number] DOB: [birth year] DOA: 06/08/2022     8 DOS: the patient was seen and examined on 06/16/2022    Assessment and Plan: * Acute necrotizing pancreatitis MRCP revealed severe acute necrotizing pancreatitis with portal vein thrombosis.  On empiric Zosyn and IV fluids.  Acute blood loss anemia Transfuse 1 unit of packed red blood cells for hemoglobin dropping down from 9.1 to 8.2 today.  Upper GI bleed Case discussed with gastroenterologist.  I started Protonix drip this morning.  GI recommended octreotide also.  Possible endoscopy tomorrow if blood pressure stable.  Severe sepsis ([place]) Present on admission secondary to severe necrotizing pancreatitis.  On Zosyn empirically  AKI (acute kidney injury) [facility]) Patient with an extra dialysis session today and on IV fluids.  Portal vein thrombosis Unable to do anticoagulation with drop in hemoglobin and upper GI bleed  Syncope Secondary to hypotension   Nephrolithiasis Nonobstructing right renal stone.  No acute issues suspected  History of prostate cancer No acute issues  BPH (benign prostatic hyperplasia) No acute issues  Diabetes mellitus without complication ([place]) Controlled.  Sliding scale insulin coverage        Subjective: Patient seen this morning and he was sitting up in bed with the nursing staff.  He felt better than what he had felt.  Initially admitted with severe sepsis and found to have acute necrotic pancreatitis.  Overnight had quite a bit of coffee-ground emesis from the NG tube.  Physical Exam: Vitals:   06/16/22 1148 06/16/22 1216 06/16/22 1219 06/16/22 1220  BP: (!) 79/55     Pulse:      Resp: (!) 28 (!) 28 (!) 31   Temp:   97.9 F (36.6 C)   TempSrc:   Oral   SpO2:      Weight:    102.1 kg  Height:       Physical Exam HENT:     Head: Normocephalic.     Mouth/Throat:     Pharynx: No oropharyngeal exudate.  Eyes:     General: Lids are normal.      Conjunctiva/sclera: Conjunctivae normal.  Cardiovascular:     Rate and Rhythm: Normal rate and regular rhythm.     Heart sounds: Normal heart sounds, S1 normal and S2 normal.  Pulmonary:     Breath sounds: Normal breath sounds. No decreased breath sounds, wheezing, rhonchi or rales.  Abdominal:     Palpations: Abdomen is soft.     Tenderness: There is no abdominal tenderness.  Musculoskeletal:     Right lower leg: Swelling present.     Left lower leg: Swelling present.  Skin:    General: Skin is warm.     Findings: No rash.  Neurological:     Mental Status: He is alert.     Data Reviewed: Creatinine 9.18, calcium 5.7, AST 106 ALT 71, hemoglobin dropped from 9.1 down to 8.2  Family Communication: Spoke with wife at the bedside  Disposition: Status is: Inpatient Remains inpatient appropriate because: Patient is very ill with GI bleed and now requiring dialysis Planned Discharge Destination: To be determined based on clinical course    Time spent: 30 minutes  Author: Loletha Grayer, MD 06/16/2022 1:39 PM  For on call review [email or web address].

## 2022-06-16 NOTE — Assessment & Plan Note (Addendum)
Continue Protonix and octreotide drips.  Possible endoscopy tomorrow if blood pressure stable and potassium low enough.

## 2022-06-16 NOTE — Assessment & Plan Note (Addendum)
Last hemoglobin 7.3.  Patient received 3 units of packed red blood cells during the hospital course.  Had GI bleed on 06/16/2022.  Having some blood loss with the continuous dialysis if the filter gets clotted and unable to return blood.

## 2022-06-16 NOTE — Progress Notes (Addendum)
Nutrition Follow Up Note   DOCUMENTATION CODES:   Obesity unspecified  INTERVENTION:   Recommend TPN initiation as pt without adequate nutrition for > 7 days.   Recommend thiamine 15m daily added to TPN x 3 days   Pt at high refeed risk; recommend monitor potassium, magnesium and phosphorus labs daily until stable  Once appropriate for tube feeds, recommend:  Vital 1.5_0 /hr- initiate at 219mhr, once tolerating, increase by 1011mr q 8 hours until goal rate of 98m35m is reached.   Pro-Source 45ml31m via tube  Free water flushes 30ml 46mours to maintain tube patency   Regimen at goal rate provides 2420kcal/day, 127g/day protein and 1371ml/d40mf free water   Rena-vit daily via tube   Vitamin D lab pending  NUTRITION DIAGNOSIS:   Inadequate oral intake related to acute illness as evidenced by NPO status.  GOAL:   Patient will meet greater than or equal to 90% of their needs -not met   MONITOR:   Diet advancement, Labs, Weight trends, Skin, I & O's  ASSESSMENT:   76 y/o 77le with h/o DM, BPH, prostate cancer, hiatal hernia, kidney stones and HTN who is admitted with severe necrotizing pancreatitis, portal vein thrombosis, liver lesions, AKI and sepsis.  -Pt s/p fluoroscopy guided post pyloric NGT 6/16 - Pt s/p initiation of HD 6/16  Pt remains NPO. NGT in place with 2500ml of56mk output. Pt's abdomen continues to be distended. Recommend TPN as pt now without adequate nutrition for > 7 days; discussed with MD. Pt is at high refeed risk. Per chart, pt appears weight stable since admission. Pt remains on HD.    Medications reviewed and include: heparin, insulin, rena-vit, protonix, miralax, senokot, LRS _1 /hr, zosyn   Labs reviewed: K 4.9 wnl, BUN 103(H), creat 9.18(H), Ca 5.7(L), Mg 2.6(H) P 6.8(H)- 6/19 Wbc- 27.7(H), Hgb 9.1(L), Hct 26.1(L) Cbgs- 140, 147, 156 x 24 hrs  Diet Order:   Diet Order             Diet - low sodium heart healthy            Diet NPO time specified  Diet effective now                  EDUCATION NEEDS:   Education needs have been addressed  Skin:  Skin Assessment: Reviewed RN Assessment  Last BM:  6/21- type 7  Height:   Ht Readings from Last 1 Encounters:  06/10/22 6' (1.829 m)    Weight:   Wt Readings from Last 1 Encounters:  06/16/22 101.2 kg    Ideal Body Weight:  80.9 kg  BMI:  Body mass index is 30.26 kg/m.  Estimated Nutritional Needs:   Kcal:  2200-2500kcal/day  Protein:  110-125g/day  Fluid:  2.1-2.4L/day  Amyria Komar CaKoleen Distance LDN Please refer to [facility] LLCand/or RD on-call/weekend/after hours pager

## 2022-06-16 NOTE — Progress Notes (Signed)
Date of Admission:  06/08/2022     ID: Xavier Esparza is a 76 y.o. male  Principal Problem:   Severe sepsis ([place]) Active Problems:   Diabetes mellitus without complication (HCC)   BPH (benign prostatic hyperplasia)   Nephrolithiasis   Acute pancreatitis   Liver mass, right lobe, abscess vs mass   Portal vein thrombosis   AKI (acute kidney injury) ([place])   Syncope   History of prostate cancer   HTN (hypertension)   Necrotizing pancreatitis    Subjective: Pt is ill but says better than yesterday Asking for his wife  Medications:   Chlorhexidine Gluconate Cloth  6 each Topical Q0600   free water  30 mL Per Tube Q4H   heparin sodium (porcine)       insulin aspart  0-15 Units Subcutaneous Q4H   multivitamin  1 tablet Per Tube QHS   [START ON 06/19/2022] pantoprazole  40 mg Intravenous Q12H   polyethylene glycol  17 g Per Tube Daily   QUEtiapine  100 mg Per Tube QHS   sennosides  5 mL Per Tube BID    Objective: Vital signs in last 24 hours: Temp:  [97.7 F (36.5 C)-100.6 F (38.1 C)] 97.9 F (36.6 C) (06/21 1219) Pulse Rate:  [65-136] 95 (06/21 1018) Resp:  [20-39] 31 (06/21 1219) BP: (57-181)/(47-149) 79/55 (06/21 1148) SpO2:  [90 %-100 %] 96 % (06/21 1018) Weight:  [101.2 kg-103.3 kg] 102.1 kg (06/21 1220)    PHYSICAL EXAM:  General: awake , ill, responding to questions appropriately , NG tun be - has coffee ground fluid Lungs: b/l air entry Decreased bases Heart: tachycardia Abdomen: some distension BS sluggish Extremities: atraumatic, no cyanosis. No edema. No clubbing Skin: No rashes or lesions. Or bruising Lymph: Cervical, supraclavicular normal. Neurologic: Grossly non-focal  Lab Results Recent Labs    06/15/22 0421 06/16/22 0628 06/16/22 1155  WBC 22.6* 27.7*  --   HGB 9.7* 9.1* 8.2*  HCT 28.5* 26.2*  --   NA 139 142  --   K 5.0 4.9  --   CL 97* 92*  --   CO2 24 31  --   BUN 71* 103*  --   CREATININE 9.07* 9.18*  --    Liver  Panel Recent Labs    06/15/22 0421 06/16/22 0628  PROT 6.0* 6.3*  ALBUMIN 2.6* 2.4*  AST 72* 106*  ALT 64* 71*  ALKPHOS 71 75  BILITOT 1.1 0.7   Microbiology: 06/08/22- BC- NG Studies/Results: DG Abd 1 View  Result Date: 06/14/2022 CLINICAL DATA:  [id number].  NG tube placement EXAM: ABDOMEN - 1 VIEW COMPARISON:  X-ray abdomen 06/14/2022 2:37 p.m. FINDINGS: Interval removal of an enteric tube. Interval placement of a new enteric tube with tip and side port overlying the gastric lumen. Persistent gaseous dilatation of the small and large bowel. Colonic diverticulosis. No radio-opaque calculi or other significant radiographic abnormality are seen. IMPRESSION: 1. Interval removal of an enteric tube. Interval placement of a new enteric tube with tip and side port overlying the gastric lumen. 2. Postop ileus. Electronically Signed   By: Iven Finn M.D.   On: 06/14/2022 16:56   DG Abd 1 View  Result Date: 06/14/2022 CLINICAL DATA:  Ileus EXAM: ABDOMEN - 1 VIEW COMPARISON:  None Available. FINDINGS: Feeding tube tip overlies the fourth portion of the duodenum. There is diffuse mild gaseous distension of bowel in a nonobstructive pattern. IMPRESSION: Mild diffuse gaseous distension foot of bowel, compatible with ileus.  Electronically Signed   By: Maurine Simmering M.D.   On: 06/14/2022 15:22        Assessment/Plan:  Acute pancreatitis with necrosis Peripancreatic edema Ascites Portal vein thrombosis Rt hepatic microabscesses Reviewed the imaging with radiologist There is phlegmonous changes in the rt lobe of liver- not an abscess yet that can be aspirated  Coffee ground fluid in NG tube Ileus Leucocytosis- very likely due to GI bleed, ileus R/o hemosuccus pancreaticus ? Repeat imaging On zosyn, may consider meropenem if imaging worse  Encephalopathy  Abnormal lfts was improving, now slight uptick  Hypocalcemia due to pancreatitis  AKI- on dialysis  Discussed the management with  care team

## 2022-06-16 NOTE — Progress Notes (Signed)
Hemodialysis Post Treatment Note   Date: 06/16/22   Access: LIJ CVC   UF Removed:  -1068 ml   Next Scheduled Treatment: 06/17/22   Note: Patient started treatment as ordered. Patient treatment terminate early due to low BP despite interventions to increase BP (SEE dialysis treatment notes). Colon Flattery, NP discuss with patient and wife possible treatment options. Report given to Vallarie Mare, RN.

## 2022-06-17 ENCOUNTER — Inpatient Hospital Stay: Payer: Self-pay

## 2022-06-17 DIAGNOSIS — K750 Abscess of liver: Secondary | ICD-10-CM | POA: Diagnosis not present

## 2022-06-17 DIAGNOSIS — N200 Calculus of kidney: Secondary | ICD-10-CM

## 2022-06-17 DIAGNOSIS — N400 Enlarged prostate without lower urinary tract symptoms: Secondary | ICD-10-CM

## 2022-06-17 DIAGNOSIS — R55 Syncope and collapse: Secondary | ICD-10-CM | POA: Diagnosis not present

## 2022-06-17 DIAGNOSIS — I81 Portal vein thrombosis: Secondary | ICD-10-CM | POA: Diagnosis not present

## 2022-06-17 DIAGNOSIS — D62 Acute posthemorrhagic anemia: Secondary | ICD-10-CM | POA: Diagnosis not present

## 2022-06-17 DIAGNOSIS — K8591 Acute pancreatitis with uninfected necrosis, unspecified: Secondary | ICD-10-CM | POA: Diagnosis not present

## 2022-06-17 DIAGNOSIS — K922 Gastrointestinal hemorrhage, unspecified: Secondary | ICD-10-CM | POA: Diagnosis not present

## 2022-06-17 LAB — CBC WITH DIFFERENTIAL/PLATELET
Abs Immature Granulocytes: 0.96 10*3/uL — ABNORMAL HIGH (ref 0.00–0.07)
Basophils Absolute: 0.1 10*3/uL (ref 0.0–0.1)
Basophils Relative: 0 %
Eosinophils Absolute: 0 10*3/uL (ref 0.0–0.5)
Eosinophils Relative: 0 %
HCT: 25.5 % — ABNORMAL LOW (ref 39.0–52.0)
Hemoglobin: 8.6 g/dL — ABNORMAL LOW (ref 13.0–17.0)
Immature Granulocytes: 4 %
Lymphocytes Relative: 4 %
Lymphs Abs: 0.9 10*3/uL (ref 0.7–4.0)
MCH: 27 pg (ref 26.0–34.0)
MCHC: 33.7 g/dL (ref 30.0–36.0)
MCV: 80.2 fL (ref 80.0–100.0)
Monocytes Absolute: 0.9 10*3/uL (ref 0.1–1.0)
Monocytes Relative: 4 %
Neutro Abs: 22 10*3/uL — ABNORMAL HIGH (ref 1.7–7.7)
Neutrophils Relative %: 88 %
Platelets: 265 10*3/uL (ref 150–400)
RBC: 3.18 MIL/uL — ABNORMAL LOW (ref 4.22–5.81)
RDW: 15.4 % (ref 11.5–15.5)
WBC: 25 10*3/uL — ABNORMAL HIGH (ref 4.0–10.5)
nRBC: 0.2 % (ref 0.0–0.2)

## 2022-06-17 LAB — COMPREHENSIVE METABOLIC PANEL
ALT: 58 U/L — ABNORMAL HIGH (ref 0–44)
AST: 86 U/L — ABNORMAL HIGH (ref 15–41)
Albumin: 2.4 g/dL — ABNORMAL LOW (ref 3.5–5.0)
Alkaline Phosphatase: 77 U/L (ref 38–126)
Anion gap: 19 — ABNORMAL HIGH (ref 5–15)
BUN: 109 mg/dL — ABNORMAL HIGH (ref 8–23)
CO2: 26 mmol/L (ref 22–32)
Calcium: 5.9 mg/dL — CL (ref 8.9–10.3)
Chloride: 93 mmol/L — ABNORMAL LOW (ref 98–111)
Creatinine, Ser: 9.37 mg/dL — ABNORMAL HIGH (ref 0.61–1.24)
GFR, Estimated: 5 mL/min — ABNORMAL LOW (ref >60)
Glucose, Bld: 142 mg/dL — ABNORMAL HIGH (ref 70–99)
Potassium: 6 mmol/L — ABNORMAL HIGH (ref 3.5–5.1)
Sodium: 138 mmol/L (ref 135–145)
Total Bilirubin: 1.1 mg/dL (ref 0.3–1.2)
Total Protein: 6 g/dL — ABNORMAL LOW (ref 6.5–8.1)

## 2022-06-17 LAB — BASIC METABOLIC PANEL
Anion gap: 16 — ABNORMAL HIGH (ref 5–15)
BUN: 90 mg/dL — ABNORMAL HIGH (ref 8–23)
CO2: 28 mmol/L (ref 22–32)
Calcium: 5.9 mg/dL — CL (ref 8.9–10.3)
Chloride: 95 mmol/L — ABNORMAL LOW (ref 98–111)
Creatinine, Ser: 9.04 mg/dL — ABNORMAL HIGH (ref 0.61–1.24)
GFR, Estimated: 6 mL/min — ABNORMAL LOW (ref >60)
Glucose, Bld: 161 mg/dL — ABNORMAL HIGH (ref 70–99)
Potassium: 5.5 mmol/L — ABNORMAL HIGH (ref 3.5–5.1)
Sodium: 139 mmol/L (ref 135–145)

## 2022-06-17 LAB — RENAL FUNCTION PANEL
Albumin: 2.2 g/dL — ABNORMAL LOW (ref 3.5–5.0)
Anion gap: 19 — ABNORMAL HIGH (ref 5–15)
BUN: 113 mg/dL — ABNORMAL HIGH (ref 8–23)
CO2: 25 mmol/L (ref 22–32)
Calcium: 5.9 mg/dL — CL (ref 8.9–10.3)
Chloride: 96 mmol/L — ABNORMAL LOW (ref 98–111)
Creatinine, Ser: 9.89 mg/dL — ABNORMAL HIGH (ref 0.61–1.24)
GFR, Estimated: 5 mL/min — ABNORMAL LOW (ref >60)
Glucose, Bld: 167 mg/dL — ABNORMAL HIGH (ref 70–99)
Phosphorus: >30 mg/dL — ABNORMAL HIGH (ref 2.5–4.6)
Potassium: 5.5 mmol/L — ABNORMAL HIGH (ref 3.5–5.1)
Sodium: 140 mmol/L (ref 135–145)

## 2022-06-17 LAB — ALBUMIN: Albumin: 2.4 g/dL — ABNORMAL LOW (ref 3.5–5.0)

## 2022-06-17 LAB — GLUCOSE, CAPILLARY
Glucose-Capillary: 136 mg/dL — ABNORMAL HIGH (ref 70–99)
Glucose-Capillary: 147 mg/dL — ABNORMAL HIGH (ref 70–99)
Glucose-Capillary: 151 mg/dL — ABNORMAL HIGH (ref 70–99)
Glucose-Capillary: 153 mg/dL — ABNORMAL HIGH (ref 70–99)
Glucose-Capillary: 175 mg/dL — ABNORMAL HIGH (ref 70–99)
Glucose-Capillary: 198 mg/dL — ABNORMAL HIGH (ref 70–99)

## 2022-06-17 LAB — VITAMIN D 25 HYDROXY (VIT D DEFICIENCY, FRACTURES): Vit D, 25-Hydroxy: 26.93 ng/mL — ABNORMAL LOW (ref 30–100)

## 2022-06-17 MED ORDER — SODIUM CHLORIDE 0.9% FLUSH
10.0000 mL | Freq: Two times a day (BID) | INTRAVENOUS | Status: DC
  Administered 2022-06-17 – 2022-06-19 (×4): 10 mL
  Administered 2022-06-19: 20 mL
  Administered 2022-06-20 – 2022-06-28 (×17): 10 mL
  Administered 2022-06-29: 20 mL

## 2022-06-17 MED ORDER — NOREPINEPHRINE 4 MG/250ML-% IV SOLN: 4.0000 ug/min | INTRAVENOUS | Status: DC

## 2022-06-17 MED ORDER — PIPERACILLIN-TAZOBACTAM 3.375 G IVPB 30 MIN
3.3750 g | Freq: Four times a day (QID) | INTRAVENOUS | Status: DC
Start: 2022-06-17 — End: 2022-06-18
  Administered 2022-06-17 – 2022-06-18 (×3): 3.375 g via INTRAVENOUS
  Filled 2022-06-17 (×8): qty 50

## 2022-06-17 MED ORDER — INSULIN ASPART 100 UNIT/ML IJ SOLN: 0.0000 [IU] | Freq: Four times a day (QID) | INTRAMUSCULAR | Status: DC

## 2022-06-17 MED ORDER — PRISMASOL BGK 0/2.5 32-2.5 MEQ/L EC SOLN
Status: DC
  Filled 2022-06-17 (×24): qty 5000

## 2022-06-17 MED ORDER — TRACE MINERALS CU-MN-SE-ZN 300-55-60-3000 MCG/ML IV SOLN
INTRAVENOUS | Status: AC
  Filled 2022-06-17: qty 400.33

## 2022-06-17 MED ORDER — TRACE MINERALS CU-MN-SE-ZN 300-55-60-3000 MCG/ML IV SOLN
INTRAVENOUS | Status: DC
  Filled 2022-06-17: qty 400.32

## 2022-06-17 MED ORDER — PRISMASOL BGK 0/2.5 32-2.5 MEQ/L EC SOLN
Status: DC
  Filled 2022-06-17 (×8): qty 5000

## 2022-06-17 MED ORDER — CALCIUM GLUCONATE-NACL 1-0.675 GM/50ML-% IV SOLN
1.0000 g | Freq: Once | INTRAVENOUS | Status: AC
  Administered 2022-06-17: 1000 mg via INTRAVENOUS
  Filled 2022-06-17: qty 50

## 2022-06-17 MED ORDER — SODIUM CHLORIDE 0.9 % FOR CRRT
INTRAVENOUS_CENTRAL | Status: DC | PRN
  Filled 2022-06-17 (×3): qty 1000

## 2022-06-17 MED ORDER — HEPARIN SODIUM (PORCINE) 1000 UNIT/ML DIALYSIS
1000.0000 [IU] | INTRAMUSCULAR | Status: DC | PRN
  Administered 2022-06-18 – 2022-06-19 (×3): 2800 [IU] via INTRAVENOUS_CENTRAL
  Administered 2022-06-21: 1000 [IU] via INTRAVENOUS_CENTRAL
  Administered 2022-06-21: 3000 [IU] via INTRAVENOUS_CENTRAL
  Administered 2022-06-22: 4000 [IU] via INTRAVENOUS_CENTRAL
  Administered 2022-06-23: 3000 [IU] via INTRAVENOUS_CENTRAL
  Filled 2022-06-17 (×2): qty 6
  Filled 2022-06-17: qty 4
  Filled 2022-06-17 (×3): qty 6
  Filled 2022-06-17: qty 3
  Filled 2022-06-17 (×2): qty 6
  Filled 2022-06-17: qty 3
  Filled 2022-06-17: qty 6
  Filled 2022-06-17 (×2): qty 3
  Filled 2022-06-17: qty 1

## 2022-06-17 MED ORDER — SODIUM CHLORIDE 0.9% FLUSH: 10.0000 mL | INTRAVENOUS | Status: DC | PRN

## 2022-06-17 NOTE — Progress Notes (Signed)
OT Cancellation Note  Patient Details Name: ULYSESS WITZ MRN: [id number] DOB: May 15, 1946   Cancelled Treatment:    Reason Eval/Treat Not Completed: Patient at procedure or test/ unavailable. Upon attempt, pt in sterile procedure, preparing to start CRRT per RN. Will re-attempt OT tx at later date/time as pt is available and medically appropriate.   Ardeth Perfect., MPH, MS, OTR/L ascom [phone] 06/17/22, 2:51 PM

## 2022-06-17 NOTE — Progress Notes (Signed)
Nutrition Follow-up  DOCUMENTATION CODES:   Obesity unspecified  INTERVENTION:   -TPN management per pharmacy -RD will follow for ability to resume enteral nutrition  NUTRITION DIAGNOSIS:   Inadequate oral intake related to acute illness as evidenced by NPO status.  Ongoing  GOAL:   Patient will meet greater than or equal to 90% of their needs  Progressing   MONITOR:   Diet advancement, Labs, Weight trends, Skin, I & O's  REASON FOR ASSESSMENT:   Consult Enteral/tube feeding initiation and management  ASSESSMENT:   76 y/o male with h/o DM, BPH, prostate cancer, hiatal hernia, kidney stones and HTN who is admitted with severe necrotizing pancreatitis, portal vein thrombosis, liver lesions, AKI and sepsis.  6/16- s/p fluoroscopy guided post pyloric NGT, HD initiated 6/19- rectal tube inserted 6/22- CRRT initiated, PICC placed, TPN initiated  Reviewed I/O's: +2.3 L x 24 hours and +7.4 L since admission  UOP: 0 ml x 24 hours  NGT output: 1.2 L x 24 hours  Rectal tube output: 150 ml x 24 hours  Pt remain NPO with NGT for low, intermittent suction. Pt has been NPO since 06/08/22.   Case discussed with RN and MD. Pt to start CRRT today. Plan for EGD today, but will hold off if K is still elevated.   Per pharmacy notes, plan to initiate TPN today at 45 ml/hr, which provides 1249 kcals and 60 grams protein, meeting 53% of estimated kcal needs and 46% of estimated protein needs.  Medications reviewed and include senokot, lactated ringers infusion @ 75 ml/hr, and pantoprazole.   Labs reviewed: CBGS: 136-147 (inpatient orders for glycemic control are 0-15 units insulin aspart every 4 hours).    Diet Order:   Diet Order             Diet NPO time specified  Diet effective midnight           Diet - low sodium heart healthy                   EDUCATION NEEDS:   Education needs have been addressed  Skin:  Skin Assessment: Reviewed RN Assessment  Last BM:   06/17/22 (via rectal tube)  Height:   Ht Readings from Last 1 Encounters:  06/10/22 6' (1.829 m)    Weight:   Wt Readings from Last 1 Encounters:  06/17/22 101.4 kg    Ideal Body Weight:  80.9 kg  BMI:  Body mass index is 30.32 kg/m.  Estimated Nutritional Needs:   Kcal:  0034-9179  Protein:  130-155 grams  Fluid:  > 2 L    Loistine Chance, RD, LDN, [place] Registered Dietitian II Certified Diabetes Care and Education Specialist Please refer to [facility] for RD and/or RD on-call/weekend/after hours pager

## 2022-06-17 NOTE — Progress Notes (Signed)
Peripherally Inserted Central Catheter Placement  The IV Nurse has discussed with the patient and/or persons authorized to consent for the patient, the purpose of this procedure and the potential benefits and risks involved with this procedure.  The benefits include less needle sticks, lab draws from the catheter, and the patient may be discharged home with the catheter. Risks include, but not limited to, infection, bleeding, blood clot (thrombus formation), and puncture of an artery; nerve damage and irregular heartbeat and possibility to perform a PICC exchange if needed/ordered by physician.  Alternatives to this procedure were also discussed.  Bard Power PICC patient education guide, fact sheet on infection prevention and patient information card has been provided to patient /or left at bedside.    PICC Placement Documentation  PICC Double Lumen 06/21/93 Right Basilic 39 cm 0 cm (Active)  Indication for Insertion or Continuance of Line Vasoactive infusions 06/17/22 1457  Exposed Catheter (cm) 0 cm 06/17/22 1457  Site Assessment Clean, Dry, Intact 06/17/22 1457  Lumen #1 Status Flushed;Saline locked;Blood return noted 06/17/22 1457  Lumen #2 Status Flushed;Saline locked;Blood return noted 06/17/22 1457  Dressing Type Transparent;Securing device 06/17/22 1457  Dressing Status Antimicrobial disc in place 06/17/22 1457  Dressing Intervention New dressing;Other (Comment) 06/17/22 1457  Dressing Change Due 06/24/22 06/17/22 1457   Consent signed by wife    Synthia Innocent 06/17/2022, 2:58 PM

## 2022-06-17 NOTE — Anesthesia Preprocedure Evaluation (Signed)
Anesthesia Evaluation  Patient identified by MRN, date of birth, ID band Patient awake    Reviewed: Allergy & Precautions, NPO status , Patient's Chart, lab work & pertinent test results  Airway Mallampati: III  TM Distance: >3 FB Neck ROM: full    Dental  (+) Chipped   Pulmonary former smoker,    Pulmonary exam normal        Cardiovascular negative cardio ROS Normal cardiovascular exam     Neuro/Psych negative neurological ROS  negative psych ROS   GI/Hepatic Neg liver ROS, Acute necrotizing pancreatitis   Endo/Other  diabetes  Renal/GU negative Renal ROS   H/o Prostate cancer    Musculoskeletal   Abdominal   Peds  Hematology  (+) Blood dyscrasia, anemia ,   Anesthesia Other Findings -GI bleed -Hgb 8.6, s/p 1 unit of packed red blood 6/21 pm -K 5.5, HD yesterday -Corrected calcium 7.18 mg/dl. 1 gm IV calcium gluconate -  Respiratory distress- Tachypnea with hypoxia    Acute necrotizing pancreatitis with Severe sepsis -MRCP revealed severe acute necrotizing pancreatitis with portal vein thrombosis -Syncope Secondary to hypotension   Past Medical History: No date: Adopted No date: Cataract     Comment:  resolved with surgery No date: Diabetes mellitus No date: Dyslipidemia No date: Erectile dysfunction 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     Comment:  Upper Lip No date: Hyperlipidemia No date: Male hypogonadism No date: Prostate cancer ([place]) No date: Renal stones No date: Thyroid nodule     Comment:  Right  Past Surgical History: 2017: CATARACT EXTRACTION, BILATERAL     Comment:  Dr. Katy Fitch 03/09/2018: COLONOSCOPY WITH PROPOFOL; N/A     Comment:  Procedure: COLONOSCOPY WITH PROPOFOL;  Surgeon: Lin Landsman, MD;  Location: [facility];  Service:               Gastroenterology;  Laterality: N/A; 03/12/2021: COLONOSCOPY WITH PROPOFOL; N/A     Comment:  Procedure: COLONOSCOPY WITH  PROPOFOL;  Surgeon: Lin Landsman, MD;  Location: [place];                Service: Endoscopy;  Laterality: N/A;  priority 4 08/09/2017: CYSTOSCOPY W/ URETERAL STENT PLACEMENT     Comment:  Procedure: CYSTOSCOPY WITH RETROGRADE PYELOGRAM/URETERAL              STENT PLACEMENT;  Surgeon: Royston Cowper, MD;                Location: [facility];  Service: Urology;; 09/15/2017: EXTRACORPOREAL SHOCK WAVE LITHOTRIPSY; Left     Comment:  Procedure: EXTRACORPOREAL SHOCK WAVE LITHOTRIPSY (ESWL);              Surgeon: Royston Cowper, MD;  Location: [facility];                Service: Urology;  Laterality: Left; 2016: EYE SURGERY; Bilateral     Comment:  cataract extractions 04/20/2016: GREEN LIGHT LASER TURP (TRANSURETHRAL RESECTION OF  PROSTATE; N/A     Comment:  Procedure: GREEN LIGHT LASER TURP (TRANSURETHRAL               RESECTION OF PROSTATE;  Surgeon: Royston Cowper, MD;                Location: [facility];  Service: Urology;  Laterality:  N/A; 01/11/2012: INGUINAL HERNIA REPAIR     Comment:  Procedure: HERNIA REPAIR INGUINAL ADULT;  Surgeon:               Pedro Earls, MD;  Location: [place];  Service: General;  Laterality: Right;  Open               right inguinal hernia repair with mesh 03/12/2021: POLYPECTOMY     Comment:  Procedure: POLYPECTOMY;  Surgeon: Lin Landsman,               MD;  Location: [place];  Service: Endoscopy;; No date: RADIOACTIVE SEED IMPLANT     Comment:  PROSTATE 08/09/2017: URETEROSCOPY WITH HOLMIUM LASER LITHOTRIPSY; Right     Comment:  Procedure: URETEROSCOPY WITH HOLMIUM LASER LITHOTRIPSY;               Surgeon: Royston Cowper, MD;  Location: [facility];                Service: Urology;  Laterality: Right;  BMI    Body Mass Index: 30.32 kg/m      Reproductive/Obstetrics negative OB ROS                             Anesthesia Physical Anesthesia  Plan  ASA: 4  Anesthesia Plan: General   Post-op Pain Management:    Induction:   PONV Risk Score and Plan: Propofol infusion and TIVA  Airway Management Planned:   Additional Equipment:   Intra-op Plan:   Post-operative Plan:   Informed Consent:     Dental Advisory Given  Plan Discussed with: Anesthesiologist, CRNA and Surgeon  Anesthesia Plan Comments:         Anesthesia Quick Evaluation

## 2022-06-17 NOTE — Progress Notes (Signed)
Cross Cover Corrected calcium 7.18 mg/dl. 1 gm IV calcium gluconate ordreed

## 2022-06-17 NOTE — Progress Notes (Signed)
CRRT Monitoring  Recommendations Adjust Zosyn (piperacillin/tazobactam) 3.375 grams every 6 hours (30 minute infusion) 6/22 PM (after CRRT initiated)    Wynelle Cleveland, PharmD Pharmacy Resident  06/17/2022 11:14 AM

## 2022-06-17 NOTE — Progress Notes (Signed)
  Progress Note   Patient: Xavier Esparza TJQ:[id number] DOB: 19-Jul-1946 DOA: 06/08/2022     9 DOS: the patient was seen and examined on 06/17/2022     Assessment and Plan: * Acute necrotizing pancreatitis MRCP revealed severe acute necrotizing pancreatitis with portal vein thrombosis.  On empiric Zosyn.  Acute blood loss anemia Transfuse 1 unit of packed red blood cells yesterday on hemoglobin dropping down to 8.2.  Hemoglobin today 8.6.  We will recheck again tomorrow and transfuse if needed.  Upper GI bleed Continue Protonix and octreotide drips.  Possible endoscopy tomorrow if blood pressure stable and potassium low enough.  Severe sepsis ([place]) Present on admission secondary to severe necrotizing pancreatitis.  On Zosyn empirically  AKI (acute kidney injury) ([place]) Patient started on CRRT on 06/17/2022  Portal vein thrombosis Unable to do anticoagulation with drop in hemoglobin and upper GI bleed  Syncope Secondary to hypotension   Nephrolithiasis Nonobstructing right renal stone.  No acute issues suspected  History of prostate cancer No acute issues  BPH (benign prostatic hyperplasia) No acute issues  Diabetes mellitus without complication ([place]) Controlled.  Sliding scale insulin coverage   Nutrition.  TPN ordered and PICC line ordered.     Subjective: Patient does not complain of any abdominal pain.  Had slight output from NG tube overnight.  Still has rectal tube in.  Patient started on CRRT today.  Admitted with acute necrotizing pancreatitis.  Physical Exam: Vitals:   06/17/22 0900 06/17/22 1000 06/17/22 1140 06/17/22 1151  BP: 120/73 116/75 124/81   Pulse: 83 82 83   Resp: 19 (!) 25 (!) 23   Temp: 98.1 F (36.7 C)   98.4 F (36.9 C)  TempSrc: Axillary   Axillary  SpO2: 94% 93% 94%   Weight:      Height:       Physical Exam HENT:     Head: Normocephalic.     Mouth/Throat:     Pharynx: No oropharyngeal exudate.  Eyes:     General: Lids are normal.      Conjunctiva/sclera: Conjunctivae normal.  Cardiovascular:     Rate and Rhythm: Normal rate and regular rhythm.     Heart sounds: Normal heart sounds, S1 normal and S2 normal.  Pulmonary:     Breath sounds: Normal breath sounds. No decreased breath sounds, wheezing, rhonchi or rales.  Abdominal:     General: There is distension.     Palpations: Abdomen is soft.     Tenderness: There is no abdominal tenderness.  Musculoskeletal:     Right lower leg: Swelling present.     Left lower leg: Swelling present.  Skin:    General: Skin is warm.     Findings: No rash.  Neurological:     Mental Status: He is alert.     Data Reviewed: Potassium 6, calcium 5.9, BUN 109 creatinine 9.37, AST 86, ALT 58, last hemoglobin 8.6, white blood cell count 25.0  Family Communication: Spoke with wife at the bedside  Disposition: Status is: Inpatient Remains inpatient appropriate because: Patient is critically ill now requiring CRRT.  PICC line and TPN ordered. Planned Discharge Destination: To be determined based on progress   Author: Loletha Grayer, MD 06/17/2022 3:49 PM  For on call review [email or web address].

## 2022-06-17 NOTE — Progress Notes (Signed)
PHARMACY - TOTAL PARENTERAL NUTRITION CONSULT NOTE   Indication: without adequate nutrition for > 7 days  Patient Measurements: Height: 6' (182.9 cm) Weight: 101.4 kg (223 lb 8.7 oz) IBW/kg (Calculated) : 77.6 TPN AdjBW (KG): 83.7 Body mass index is 30.32 kg/m.  Assessment: 76 y/o male with h/o DM, BPH, prostate cancer, hiatal hernia, kidney stones and HTN who is admitted with severe necrotizing pancreatitis, portal vein thrombosis, liver lesions, AKI and sepsis.  Glucose / Insulin: BG 140 - 162 (15 units SSI required) Electrolytes: hyperkalemia Renal: CRRT Hepatic: LFTs elevated, trending down Intake / Output; MIVF: lactated ringers infusion at 75 mL/hr GI Imaging: none recent GI Surgeries / Procedures: none recent  Central access: 06/17/22 (order placed) TPN start date: 06/17/22 pending PICC placement  Nutritional Goals: Goal TPN rate is 90 mL/hr (provides 120 g of protein and 2500 kcals per day)  RD Assessment: Estimated Needs Total Energy Estimated Needs: 2200-2500kcal/day Total Protein Estimated Needs: 110-125g/day Total Fluid Estimated Needs: 2.1-2.4L/day  Current Nutrition:  NPO  Plan:  Start TPN at 88m/hr at 1800 Nutritional components: Amino acids (using 15% Clinisol): 60 grams Dextrose: 186.8 grams Lipids (using 20% SMOFlipids) 37.4 grams kCal 1250  Electrolytes in TPN (standard): Na 516m/L, K 2025mL, Ca 5mE21m, Mg 1mEq77m and Phos 5mmol9m Cl:Ac 1:1 Add standard MVI and trace elements to TPN Add thiamine 100 mg daily x 3 days continue moderate q4h SSI and adjust as needed  Stop MIVF when TPN begins Monitor TPN labs on Mon/Thurs, daily until stable  RodneyDallie [email or web address],11:25 AM

## 2022-06-17 NOTE — Progress Notes (Signed)
[facility]  ROUNDING NOTE   Subjective:   Patient seen and evaluated at bedside in ICU  Ill-appearing Alert and oriented, disorganized conversation Remains n.p.o. with NG tube in place to low wall intermittent suction, large volume drainage Mild lower extremity edema Foley remains in place   Objective:  Vital signs in last 24 hours:  Temp:  [98.1 F (36.7 C)-99.8 F (37.7 C)] 98.4 F (36.9 C) (06/22 1151) Pulse Rate:  [78-104] 83 (06/22 1140) Resp:  [19-45] 23 (06/22 1140) BP: (87-130)/(58-82) 124/81 (06/22 1140) SpO2:  [91 %-98 %] 94 % (06/22 1140) Weight:  [101.4 kg] 101.4 kg (06/22 0500)  Weight change: -0.4 kg Filed Weights   06/16/22 0947 06/16/22 1220 06/17/22 0500  Weight: 101.2 kg 102.1 kg 101.4 kg    Intake/Output: I/O last 3 completed shifts: In: 2675.8 [I.V.:1717.4; Blood:392.8; Other:70; NG/GT:30; IV Piggyback:465.6] Out: 1732 [Emesis/NG output:2600; Stool:200]   Intake/Output this shift:  Total I/O In: 329.8 [I.V.:329.8] Out: -   Physical Exam: General: NAD, resting comfortably  Head: Normocephalic, atraumatic. Moist oral mucosal membranes  Eyes: Anicteric  Lungs:  Basilar rhonchi, normal effort, room air  Heart: Regular rate and rhythm  Abdomen:  Firm, distended, mild diffuse tenderness, BS absent  Extremities: No peripheral edema.  Neurologic: Alert, able to answer simple questions  Skin: Warm, dry  Access: Left IJ dialysis catheter 06/01/2022.  Xavier Esparza.    Basic Metabolic Panel: Recent Labs  Lab 06/10/22 1700 06/11/22 0419 06/12/22 0449 06/13/22 0450 06/14/22 0423 06/15/22 0421 06/16/22 0628 06/17/22 0331 06/17/22 1015  NA  --    < > 136 136 135 139 142 139 138  K  --    < > 4.2 4.3 4.8 5.0 4.9 5.5* 6.0*  CL  --    < > 100 99 98 97* 92* 95* 93*  CO2  --    < > '25 24 23 24 31 28 26  '$ GLUCOSE  --    < > 156* 182* 210* 153* 172* 161* 142*  BUN  --    < > 80* 83* 88* 71* 103* 90* 109*  CREATININE  --    < > 6.76*  7.54* 9.65* 9.07* 9.18* 9.04* 9.37*  CALCIUM  --    < > 4.0* 4.2* 4.2* 5.3* 5.7* 5.9* 5.9*  MG  --   --  1.8 2.1 2.2  --  2.6*  --   --   PHOS 3.4  --  4.0 5.0* 6.8*  --   --   --   --    < > = values in this interval not displayed.     Liver Function Tests: Recent Labs  Lab 06/13/22 0450 06/14/22 0423 06/15/22 0421 06/16/22 0628 06/17/22 0331 06/17/22 1015  AST 106* 75* 72* 106*  --  86*  ALT 105* 74* 64* 71*  --  58*  ALKPHOS 62 61 71 75  --  77  BILITOT 0.7 0.8 1.1 0.7  --  1.1  PROT 5.5* 5.3* 6.0* 6.3*  --  6.0*  ALBUMIN 2.3* 2.7* 2.6* 2.4* 2.4* 2.4*    Recent Labs  Lab 06/12/22 0449  LIPASE 102*    Recent Labs  Lab 06/10/22 1900  AMMONIA 13     CBC: Recent Labs  Lab 06/13/22 0450 06/14/22 0423 06/15/22 0421 06/16/22 0628 06/16/22 1155 06/17/22 0331  WBC 12.7* 14.2* 22.6* 27.7*  --  25.0*  NEUTROABS 10.1* 12.1* 19.7* 24.5*  --  22.0*  HGB 10.6* 9.2* 9.7*  9.1* 8.2* 8.6*  HCT 31.6* 27.6* 28.5* 26.2*  --  25.5*  MCV 81.9 79.8* 79.4* 77.7*  --  80.2  PLT 146* 141* 185 246  --  265     Cardiac Enzymes: Recent Labs  Lab 06/10/22 1618 06/11/22 0419  CKTOTAL 1,611* 1,740*     BNP: Invalid input(s): "POCBNP"  CBG: Recent Labs  Lab 06/16/22 1920 06/16/22 2330 06/17/22 0334 06/17/22 0739 06/17/22 1142  GLUCAP 148* 162* 153* 136* 147*     Microbiology: Results for orders placed or performed during the hospital encounter of 06/08/22  Blood culture (routine x 2)     Status: None   Collection Time: 06/08/22  1:33 PM   Specimen: BLOOD  Result Value Ref Range Status   Specimen Description BLOOD LEFT HAND  Final   Special Requests   Final    BOTTLES DRAWN AEROBIC AND ANAEROBIC Blood Culture results may not be optimal due to an inadequate volume of blood received in culture bottles   Culture   Final    NO GROWTH 5 DAYS Performed at [facility], [address] [place], [place] [address]    Report Status 06/13/2022 FINAL  Final   Blood culture (routine x 2)     Status: None   Collection Time: 06/08/22  2:15 PM   Specimen: BLOOD  Result Value Ref Range Status   Specimen Description BLOOD BLOOD LEFT WRIST  Final   Special Requests   Final    BOTTLES DRAWN AEROBIC AND ANAEROBIC Blood Culture results may not be optimal due to an inadequate volume of blood received in culture bottles   Culture   Final    NO GROWTH 5 DAYS Performed at [facility], [address], [place], [place] [address]    Report Status 06/13/2022 FINAL  Final  MRSA Next Gen by PCR, Nasal     Status: None   Collection Time: 06/10/22  4:14 PM   Specimen: Nasal Mucosa; Nasal Swab  Result Value Ref Range Status   MRSA by PCR Next Gen NOT DETECTED NOT DETECTED Final    Comment: (NOTE) The GeneXpert MRSA Assay (FDA approved for NASAL specimens only), is one component of a comprehensive MRSA colonization surveillance program. It is not intended to diagnose MRSA infection nor to guide or monitor treatment for MRSA infections. Test performance is not FDA approved in patients less than 60 years old. Performed at [facility], [address] [place], [place] [address]   SARS Coronavirus 2 by RT PCR (hospital order, performed in [facility] hospital lab) *cepheid single result test* Anterior Nasal Swab     Status: None   Collection Time: 06/10/22  5:17 PM   Specimen: Anterior Nasal Swab  Result Value Ref Range Status   SARS Coronavirus 2 by RT PCR NEGATIVE NEGATIVE Final    Comment: (NOTE) SARS-CoV-2 target nucleic acids are NOT DETECTED.  The SARS-CoV-2 RNA is generally detectable in upper and lower respiratory specimens during the acute phase of infection. The lowest concentration of SARS-CoV-2 viral copies this assay can detect is 250 copies / mL. A negative result does not preclude SARS-CoV-2 infection and should not be used as the sole basis for treatment or other patient management decisions.  A negative result may occur  with improper specimen collection / handling, submission of specimen other than nasopharyngeal swab, presence of viral mutation(s) within the areas targeted by this assay, and inadequate number of viral copies (<250 copies / mL). A negative  result must be combined with clinical observations, patient history, and epidemiological information.  Fact Sheet for Patients:   https://www.patel.info/  Fact Sheet for Healthcare Providers: https://hall.com/  This test is not yet approved or  cleared by the [place] FDA and has been authorized for detection and/or diagnosis of SARS-CoV-2 by FDA under an Emergency Use Authorization (EUA).  This EUA will remain in effect (meaning this test can be used) for the duration of the COVID-19 declaration under Section 564(b)(1) of the Act, 21 U.S.C. section 360bbb-3(b)(1), unless the authorization is terminated or revoked sooner.  Performed at [facility], [address] [place], [place] [address]     Coagulation Studies: No results for input(s): "LABPROT", "INR" in the last 72 hours.   Urinalysis: No results for input(s): "COLORURINE", "LABSPEC", "PHURINE", "GLUCOSEU", "HGBUR", "BILIRUBINUR", "KETONESUR", "PROTEINUR", "UROBILINOGEN", "NITRITE", "LEUKOCYTESUR" in the last 72 hours.  Invalid input(s): "APPERANCEUR"     Imaging: [place] EKG SITE RITE  Result Date: 06/17/2022 If Site Rite image not attached, placement could not be confirmed due to current cardiac rhythm.    Medications:    sodium chloride Stopped (06/13/22 2043)   sodium chloride     lactated ringers 75 mL/hr at 06/17/22 1000   norepinephrine (LEVOPHED) Adult infusion     octreotide (SANDOSTATIN) 500 mcg in sodium chloride 0.9 % 250 mL (2 mcg/mL) infusion 50 mcg/hr (06/17/22 1000)   pantoprazole 8 mg/hr (06/17/22 1000)   piperacillin-tazobactam     prismasol BGK 2/2.5 dialysis solution     prismasol BGK 2/2.5  replacement solution     prismasol BGK 2/2.5 replacement solution     TPN ADULT (ION)      Chlorhexidine Gluconate Cloth  6 each Topical Q0600   free water  30 mL Per Tube Q4H   insulin aspart  0-15 Units Subcutaneous Q4H   multivitamin  1 tablet Per Tube QHS   [START ON 06/19/2022] pantoprazole  40 mg Intravenous Q12H   polyethylene glycol  17 g Per Tube Daily   QUEtiapine  100 mg Per Tube QHS   sennosides  5 mL Per Tube BID   sodium chloride, acetaminophen **OR** acetaminophen, alteplase, bisacodyl, guaiFENesin-dextromethorphan, heparin, HYDROmorphone (DILAUDID) injection, hydrOXYzine, ipratropium-albuterol, lidocaine (PF), lidocaine-prilocaine, metoCLOPramide (REGLAN) injection, mouth rinse, oxyCODONE, pentafluoroprop-tetrafluoroeth, polyethylene glycol, prochlorperazine, sodium chloride  Assessment/ Plan:  Xavier Esparza is a 76 y.o.  male with past medical history consistent of prostate cancer, diabetes, recurrent kidney stones with lithotripsy and BPH, who was admitted to [facility] on 06/08/2022 for Portal vein thrombosis [I81] Lactic acidosis [E87.20] Necrotizing pancreatitis [K85.91] Liver mass [R16.0] Syncope, unspecified syncope type [R55] Leukocytosis, unspecified type [D72.829] Acute pancreatitis, unspecified complication status, unspecified pancreatitis type [K85.90]   Acute kidney injury with gross hematuria likely secondary to severe illness and IV contrast exposure.  Normal kidney function prior to admission.  IV contrast exposure on 06/08/2022 and 06/09/2022.  Renal ultrasound negative for obstruction.  Urinalysis shows hematuria and mild proteinuria.   ?  Gross hematuria related to Foley catheter.  Patient has history of prostate cancer.   Received first dialysis treatment on June 16.   Attempted dialysis yesterday, patient experienced multiple hypotensives events during 1:43 min treatment before early termination. Patient given a total of 1.2L NS bolus during that time and  albumin for BP support. Due to multiple attempts to perform intermittent dialysis with hypotensive events, we will initiate CRRT. Will order Levophed for blood pressure support. Will continue to monitor renal function.   Lab  Results  Component Value Date   CREATININE 9.37 (H) 06/17/2022   CREATININE 9.04 (H) 06/17/2022   CREATININE 9.18 (H) 06/16/2022    Intake/Output Summary (Last 24 hours) at 06/17/2022 1237 Last data filed at 06/17/2022 1000 Gross per 24 hour  Intake 2667.02 ml  Output 650 ml  Net 2017.02 ml    Acute pancreatitis, severe necrotizing.  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   Remains NPO.  Supportive care with antiemetics and pain control.  Receiving antibiotic therapy managed by primary team.  Continue lactated Ringer's at 75 mL/h for hydration.   Diabetes mellitus type 2 without complication.Home regimen includes metformin.  Hemoglobin A1c 6.2 on 01/08/2022.  -Agree with holding metformin. -Improved glucose control. SSI managed by primary team  Acute ileus, receiving IV narcotics for severe acute pancreatitis. Tube feeds stopped and NGT placed to LWIS. Rectal tube placed  Continues to have large volume output. EGD scheduled for today.   LOS: [place] 6/22/202312:37 PM

## 2022-06-18 ENCOUNTER — Inpatient Hospital Stay: Payer: Medicare Other | Admitting: Anesthesiology

## 2022-06-18 ENCOUNTER — Encounter
Admission: EM | Discharge status: Against Medical Advice | Payer: Self-pay | Source: Home / Self Care | Attending: Internal Medicine

## 2022-06-18 ENCOUNTER — Inpatient Hospital Stay: Payer: Medicare Other

## 2022-06-18 DIAGNOSIS — K922 Gastrointestinal hemorrhage, unspecified: Secondary | ICD-10-CM | POA: Diagnosis not present

## 2022-06-18 DIAGNOSIS — R55 Syncope and collapse: Secondary | ICD-10-CM | POA: Diagnosis not present

## 2022-06-18 DIAGNOSIS — K8591 Acute pancreatitis with uninfected necrosis, unspecified: Secondary | ICD-10-CM | POA: Diagnosis not present

## 2022-06-18 DIAGNOSIS — G9341 Metabolic encephalopathy: Secondary | ICD-10-CM

## 2022-06-18 DIAGNOSIS — D62 Acute posthemorrhagic anemia: Secondary | ICD-10-CM | POA: Diagnosis not present

## 2022-06-18 HISTORY — PX: ESOPHAGOGASTRODUODENOSCOPY (EGD) WITH PROPOFOL: SHX5813

## 2022-06-18 LAB — CBC WITH DIFFERENTIAL/PLATELET
Abs Immature Granulocytes: 0 10*3/uL (ref 0.00–0.07)
Basophils Absolute: 0 10*3/uL (ref 0.0–0.1)
Basophils Relative: 0 %
Eosinophils Absolute: 0 10*3/uL (ref 0.0–0.5)
Eosinophils Relative: 0 %
HCT: 23.6 % — ABNORMAL LOW (ref 39.0–52.0)
Hemoglobin: 8 g/dL — ABNORMAL LOW (ref 13.0–17.0)
Lymphocytes Relative: 3 %
Lymphs Abs: 0.8 10*3/uL (ref 0.7–4.0)
MCH: 28.3 pg (ref 26.0–34.0)
MCHC: 33.9 g/dL (ref 30.0–36.0)
MCV: 83.4 fL (ref 80.0–100.0)
Monocytes Absolute: 0.8 10*3/uL (ref 0.1–1.0)
Monocytes Relative: 3 %
Neutro Abs: 24.2 10*3/uL — ABNORMAL HIGH (ref 1.7–7.7)
Neutrophils Relative %: 94 %
Platelets: 253 10*3/uL (ref 150–400)
RBC: 2.83 MIL/uL — ABNORMAL LOW (ref 4.22–5.81)
RDW: 16.5 % — ABNORMAL HIGH (ref 11.5–15.5)
WBC: 25.7 10*3/uL — ABNORMAL HIGH (ref 4.0–10.5)
nRBC: 0.5 % — ABNORMAL HIGH (ref 0.0–0.2)

## 2022-06-18 LAB — RENAL FUNCTION PANEL
Albumin: 2 g/dL — ABNORMAL LOW (ref 3.5–5.0)
Albumin: 2 g/dL — ABNORMAL LOW (ref 3.5–5.0)
Anion gap: 11 (ref 5–15)
Anion gap: 12 (ref 5–15)
BUN: 74 mg/dL — ABNORMAL HIGH (ref 8–23)
BUN: 77 mg/dL — ABNORMAL HIGH (ref 8–23)
CO2: 27 mmol/L (ref 22–32)
CO2: 24 mmol/L (ref 22–32)
Calcium: 7.7 mg/dL — ABNORMAL LOW (ref 8.9–10.3)
Calcium: 7.6 mg/dL — ABNORMAL LOW (ref 8.9–10.3)
Chloride: 102 mmol/L (ref 98–111)
Chloride: 103 mmol/L (ref 98–111)
Creatinine, Ser: 5.68 mg/dL — ABNORMAL HIGH (ref 0.61–1.24)
Creatinine, Ser: 6.32 mg/dL — ABNORMAL HIGH (ref 0.61–1.24)
GFR, Estimated: 10 mL/min — ABNORMAL LOW (ref >60)
GFR, Estimated: 8 mL/min — ABNORMAL LOW (ref >60)
Glucose, Bld: 185 mg/dL — ABNORMAL HIGH (ref 70–99)
Glucose, Bld: 212 mg/dL — ABNORMAL HIGH (ref 70–99)
Phosphorus: 5.9 mg/dL — ABNORMAL HIGH (ref 2.5–4.6)
Phosphorus: 6.6 mg/dL — ABNORMAL HIGH (ref 2.5–4.6)
Potassium: 4 mmol/L (ref 3.5–5.1)
Potassium: 4.4 mmol/L (ref 3.5–5.1)
Sodium: 140 mmol/L (ref 135–145)
Sodium: 139 mmol/L (ref 135–145)

## 2022-06-18 LAB — GLUCOSE, CAPILLARY
Glucose-Capillary: 184 mg/dL — ABNORMAL HIGH (ref 70–99)
Glucose-Capillary: 191 mg/dL — ABNORMAL HIGH (ref 70–99)
Glucose-Capillary: 179 mg/dL — ABNORMAL HIGH (ref 70–99)
Glucose-Capillary: 191 mg/dL — ABNORMAL HIGH (ref 70–99)
Glucose-Capillary: 196 mg/dL — ABNORMAL HIGH (ref 70–99)

## 2022-06-18 LAB — MAGNESIUM
Magnesium: 2.5 mg/dL — ABNORMAL HIGH (ref 1.7–2.4)
Magnesium: 2.2 mg/dL (ref 1.7–2.4)

## 2022-06-18 LAB — PREPARE RBC (CROSSMATCH)

## 2022-06-18 IMAGING — CT CT ABD-PELV W/O CM
2 of 4 series · 16 of 46 positions shown, 18 images · non-contrast
Comparison: None Available.

CLINICAL DATA: Acute severe pancreatitis



[Series 2: axial st · axial · 0.93mm/px · z∈[-565,-85]mm · 13 of 110 slices shown, 15 images]
[im 9/110  soft-tissue]
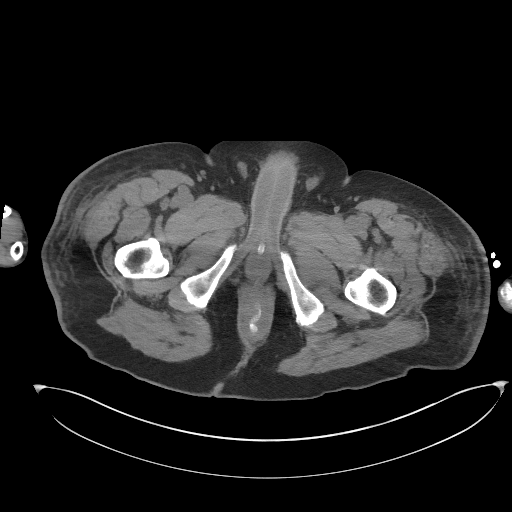
[im 9/110  bone]
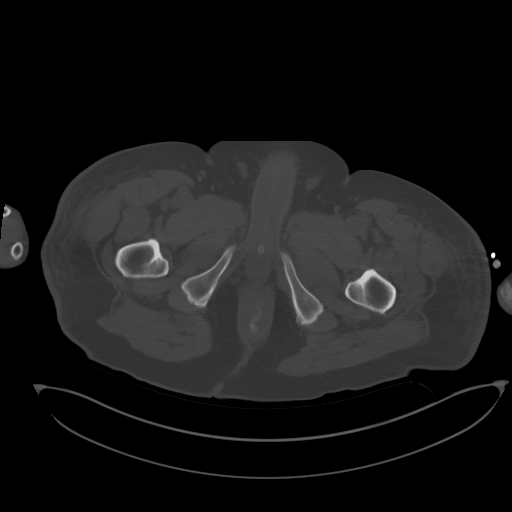
[im 17/110  soft-tissue]
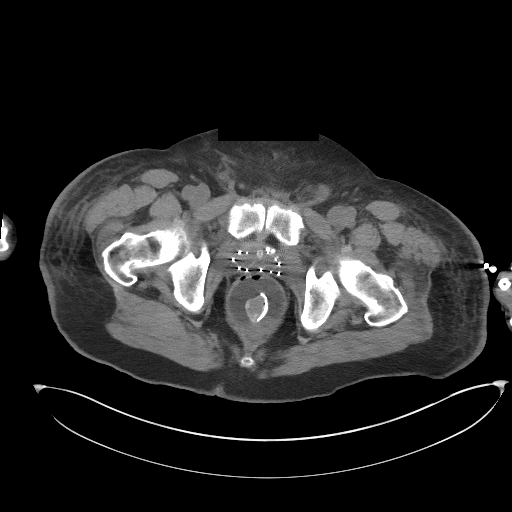
[im 25/110  soft-tissue]
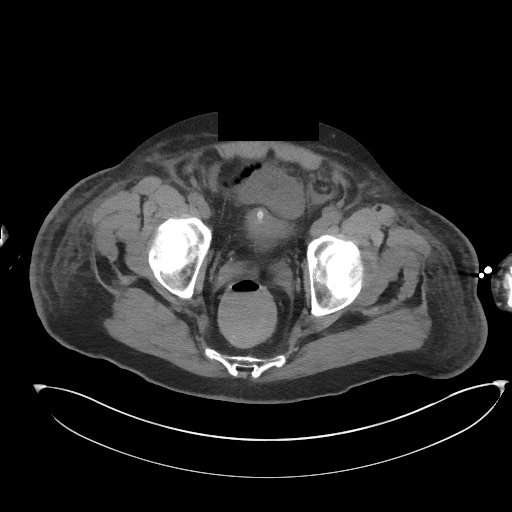
[im 33/110  soft-tissue]
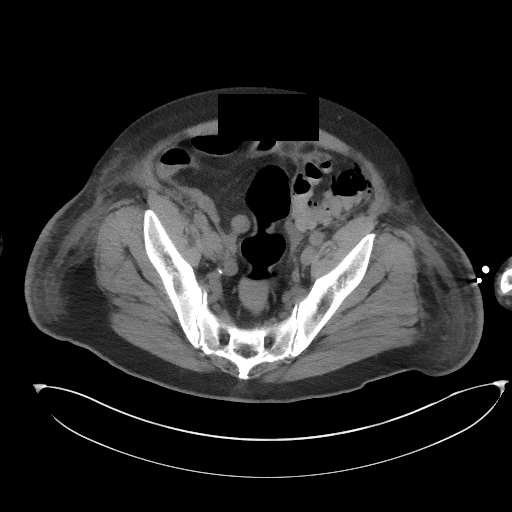
[im 41/110  soft-tissue]
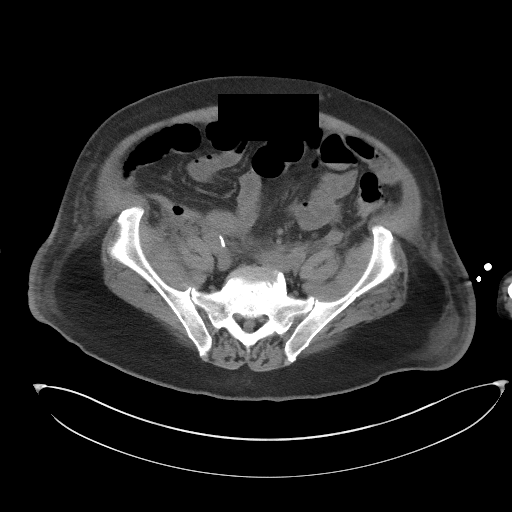
[im 49/110  soft-tissue]
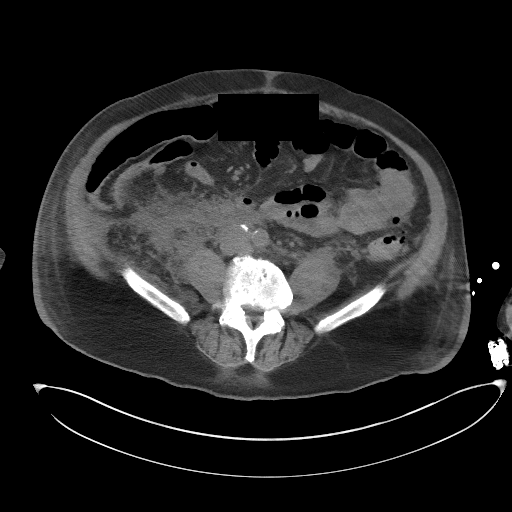
[im 57/110  soft-tissue]
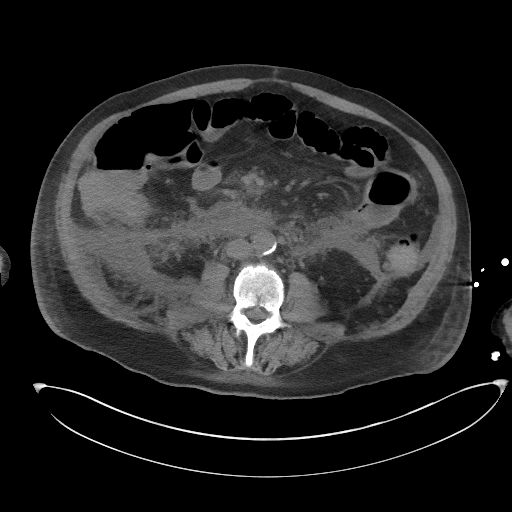
[im 65/110  soft-tissue]
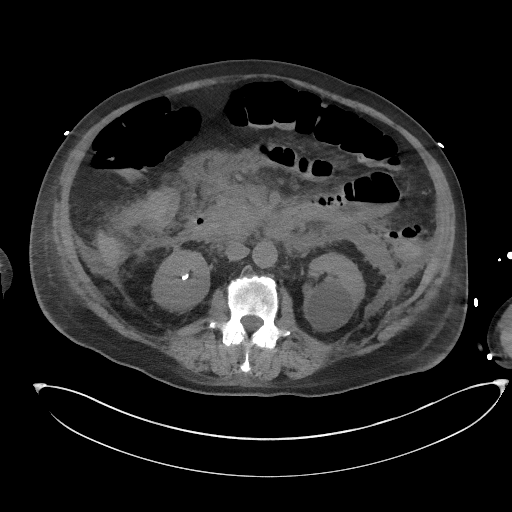
[im 73/110  soft-tissue]
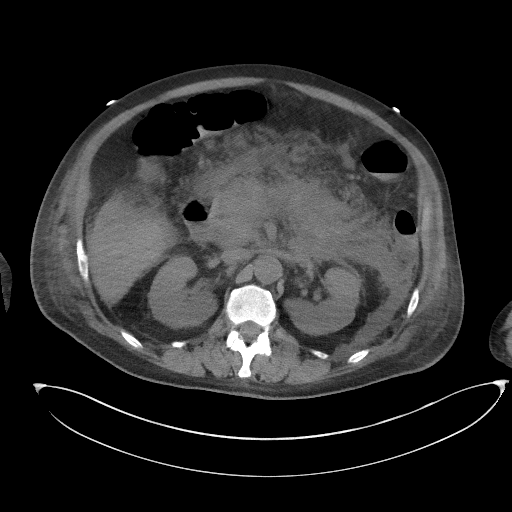
[im 73/110  bone]
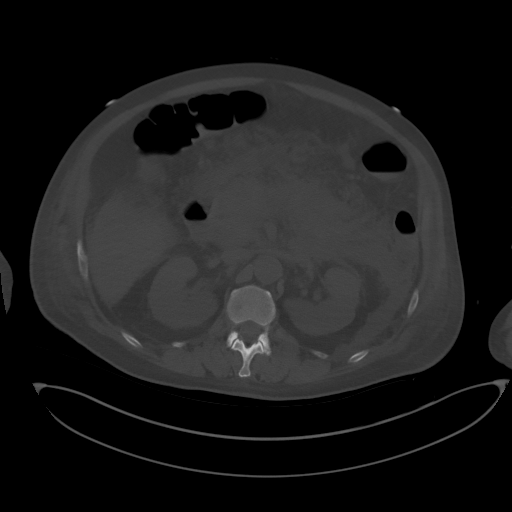
[im 81/110  soft-tissue]
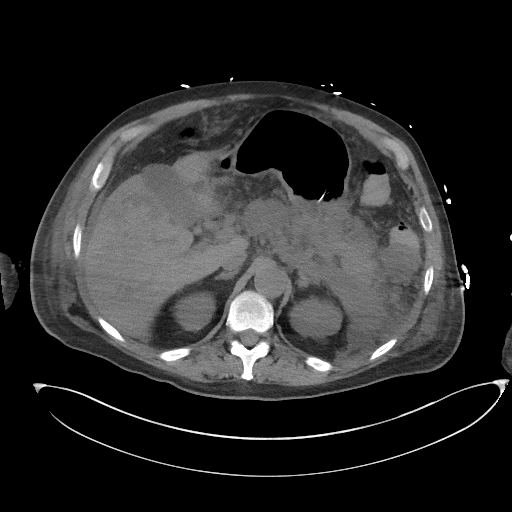
[im 89/110  soft-tissue]
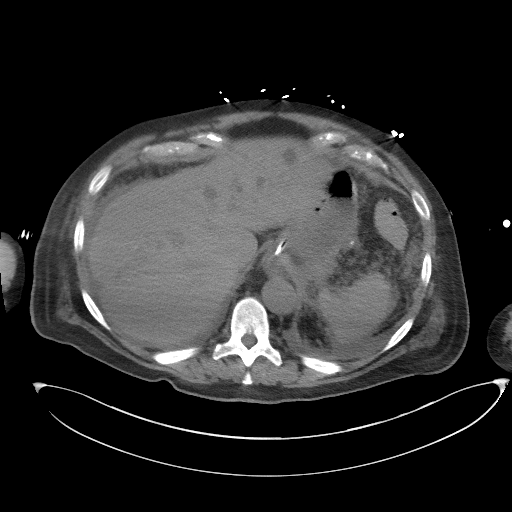
[im 97/110  soft-tissue]
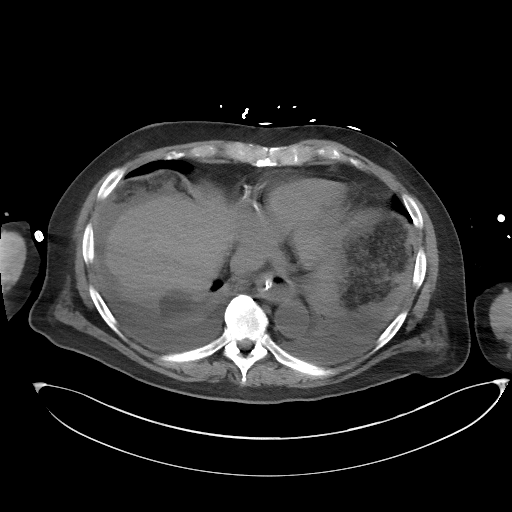
[im 105/110  soft-tissue]
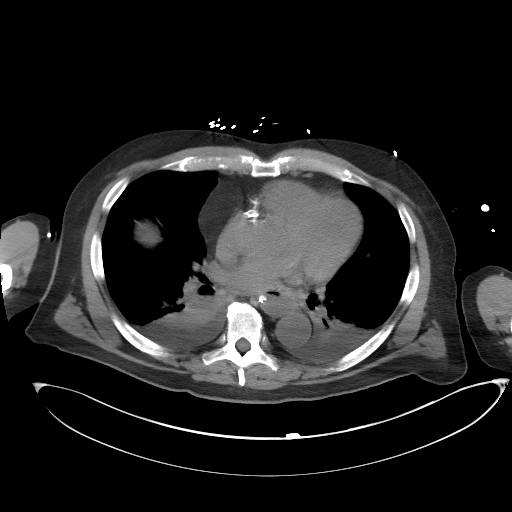

[Series 5: coronal st · coronal · 0.97mm/px · 3 of 114 slices shown]
[im 38/114  soft-tissue]
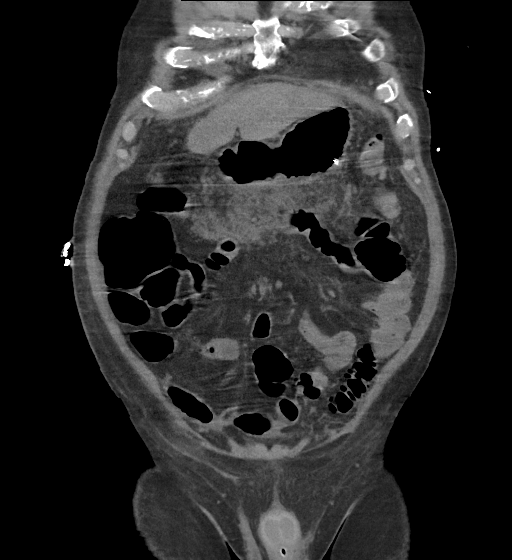
[im 51/114  soft-tissue]
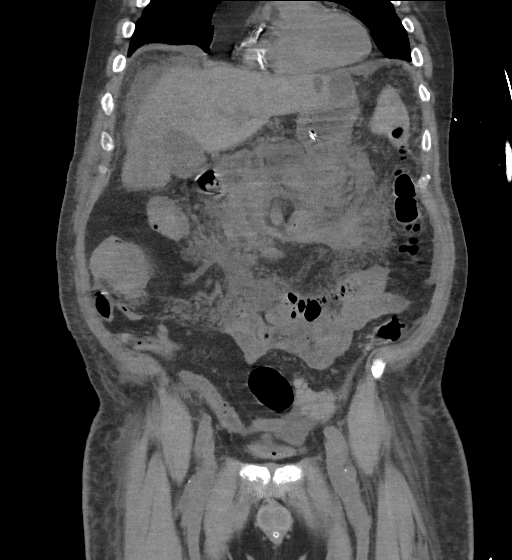
[im 63/114  soft-tissue]
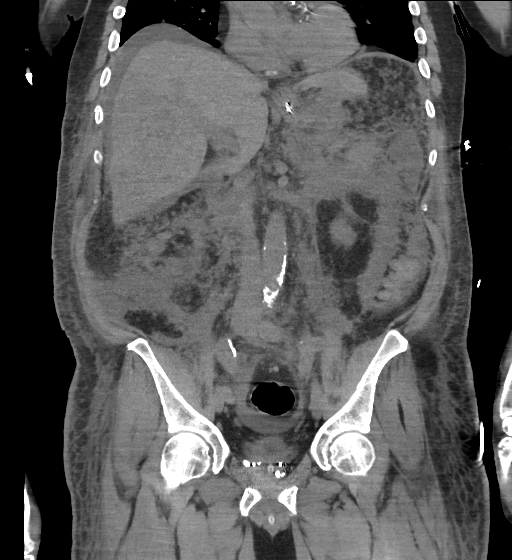

[16 of 46 positions shown; findings below may reference images not displayed]

FINDINGS: Lower chest: Bibasilar effusions and passive atelectasis not
changed. No pericardial fluid.

Hepatobiliary: Segmental hypodensities in the medial RIGHT hepatic
lobe remain concerning for hepatic abscess. Additional low-density
hepatic cysts again noted. No change. Gallbladder unchanged.

Pancreas: There is interval increase in edema inflammation of the
pancreas poor definition of the border. There is a lesion of low
attenuation within the mid body pancreas measuring 4 cm x 2.4 cm
(35/2 which is poorly defined but appears new from prior. No clear
organized fluid collections.

There is interval increase in the omental mesenteric thickening
associated with acute pancreatitis. Suggestion of multiple small
nodules within the omentum and retroperitoneum associated with the
fluid collections. For example in Morrison's pouch multiple small
nodules. Representative hazy thickening along the anterior LEFT and
RIGHT pararenal fascia. Injection of the retroperitoneal fat within
the pararenal space and deep the pararenal space on the RIGHT for
example (image 50/2).

Increased thickening along the LEFT pararenal space (image 39/2). No
organized fluid collections.

Spleen: Normal spleen

Adrenals/urinary tract: Adrenal glands and kidneys normal.
Nonobstructing calculus are RIGHT kidney. Foley catheter in bladder.

Stomach/Bowel: NG tube in stomach. Duodenum small bowel grossly
normal. Appendix normal. Multiple diverticula of the descending
colon and sigmoid colon without acute inflammation. Fluid stool in
the rectum. Rectal tube in place

Vascular/Lymphatic: Abdominal aorta is normal caliber. No periportal
or retroperitoneal adenopathy. No pelvic adenopathy.

Reproductive: Unremarkable

Other: No free fluid.

Musculoskeletal: No aggressive osseous lesion.
IMPRESSION: 1. Increased nodularity associated with the omental inflammation and
inflammatory thickening along the retroperitoneal perirenal space.
This nodularity suggest progression of pancreatic inflammation
versus potentially pancreatic adenocarcinoma metastasis. Recommend
correlation with appropriate tumor markers.
2. Pancreas is less well-defined. There is a low-density region
within the mid body the pancreas. No clear organized fluid
collections are present.
3. Cluster of low-attenuation lesion in the RIGHT hepatic lobe could
represent hepatic abscess. Cannot exclude malignancy.

## 2022-06-18 IMAGING — CT CT HEAD W/O CM
4 series · 17 of 47 positions shown, 19 images · non-contrast
Comparison: No pertinent prior exams available for comparison.

CLINICAL DATA: Provided history: Delirium.



[Series 2: head wo · axial · 0.43mm/px · z∈[+184,+304]mm · 7 of 32 slices shown, 9 images]
[im 4/32  brain]
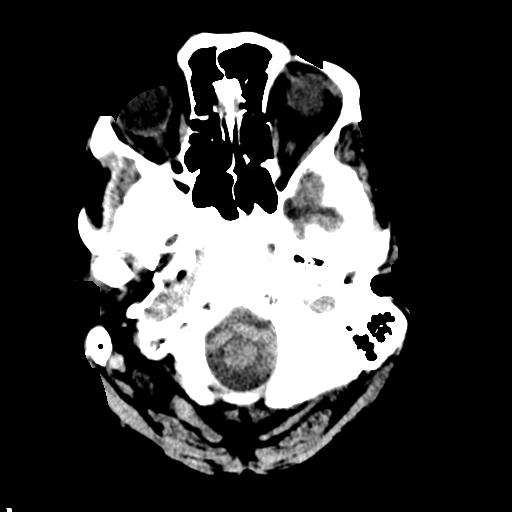
[im 4/32  bone]
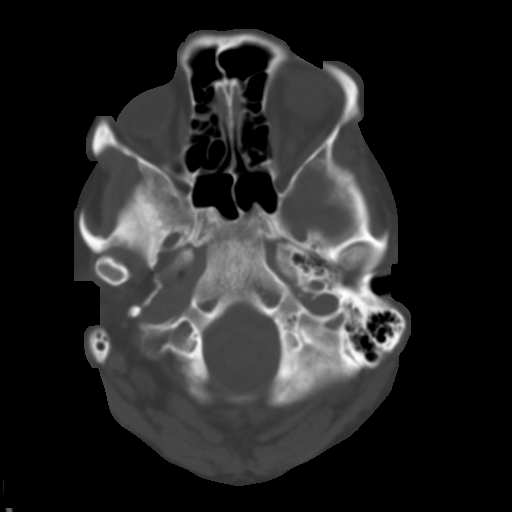
[im 8/32  brain]
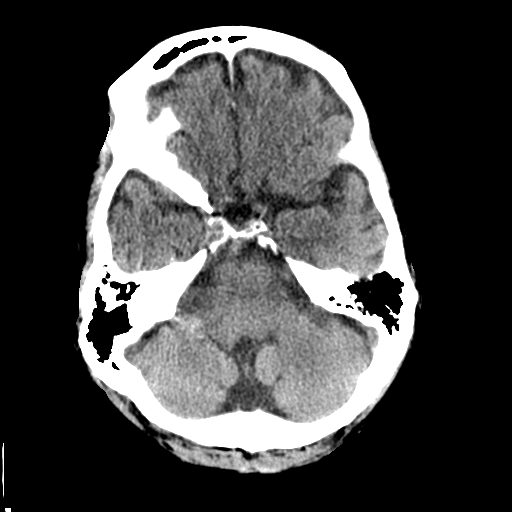
[im 12/32  brain]
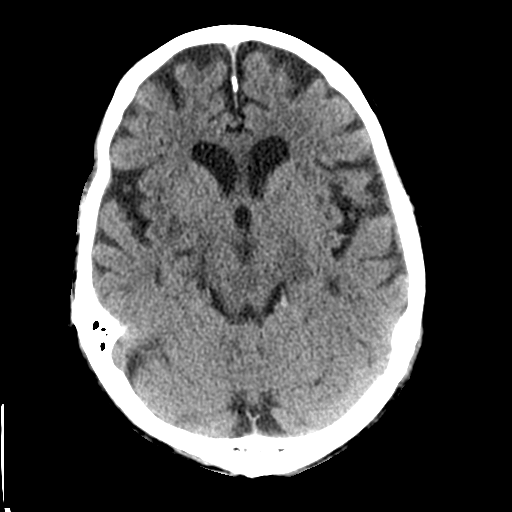
[im 16/32  brain]
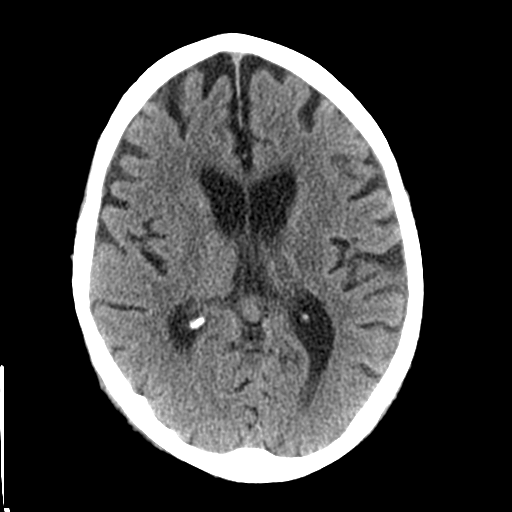
[im 20/32  brain]
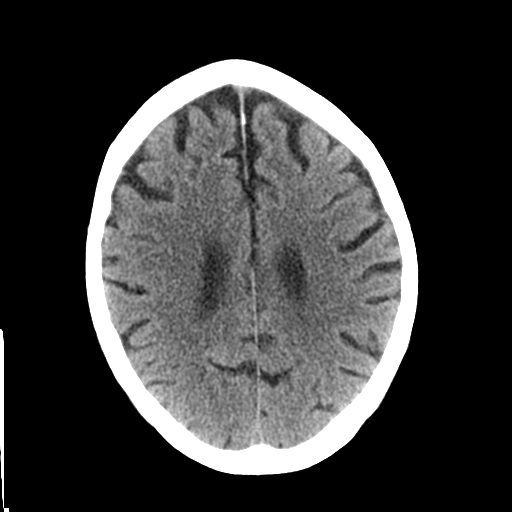
[im 20/32  bone]
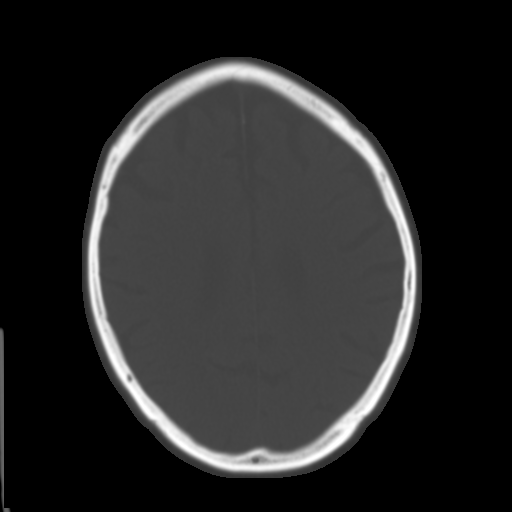
[im 24/32  brain]
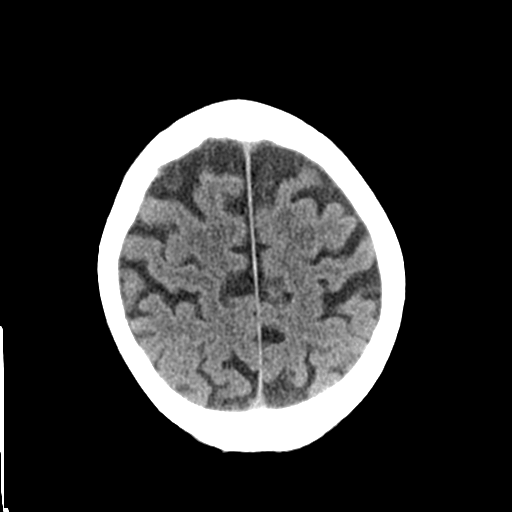
[im 28/32  brain]
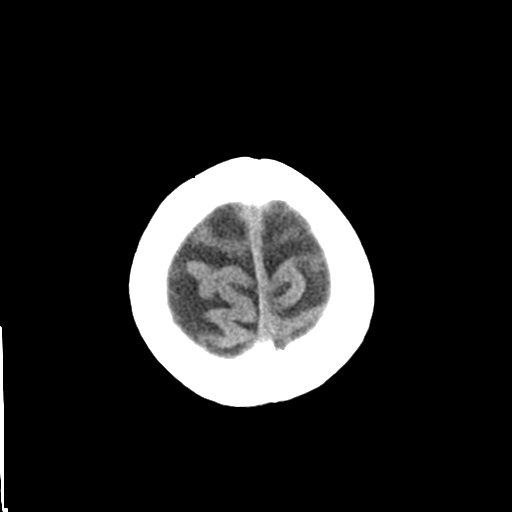

[Series 3: head bone · axial · 0.43mm/px · z∈[+183,+239]mm · 4 of 80 slices shown]
[im 8/80  bone]
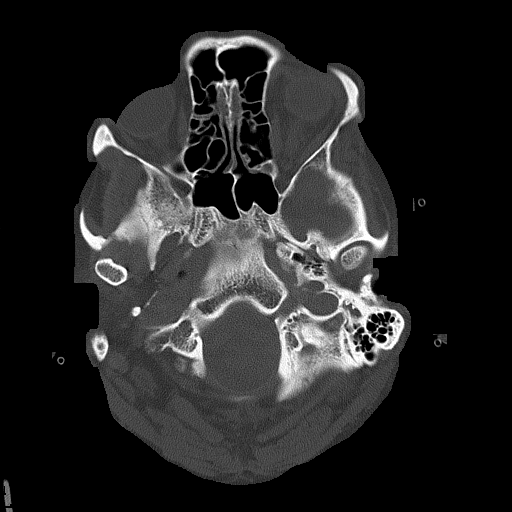
[im 16/80  bone]
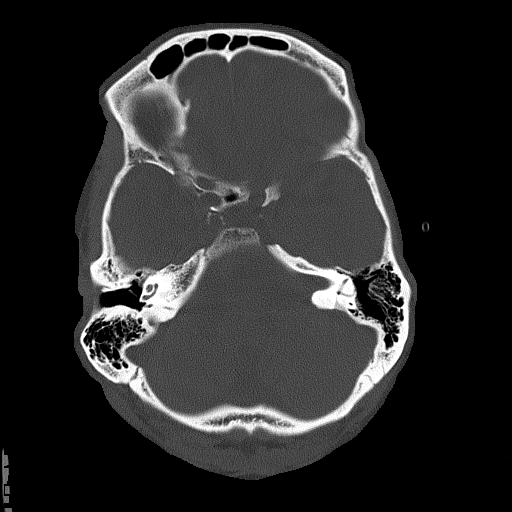
[im 24/80  bone]
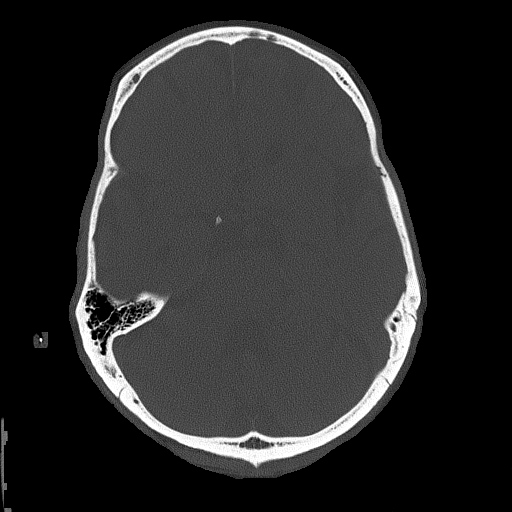
[im 36/80  bone]
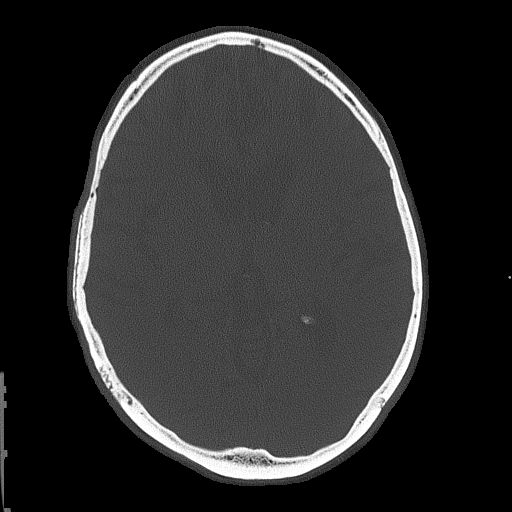

[Series 4: coronal soft tissue · coronal · 0.33mm/px · 3 of 72 slices shown]
[im 27/72  brain]
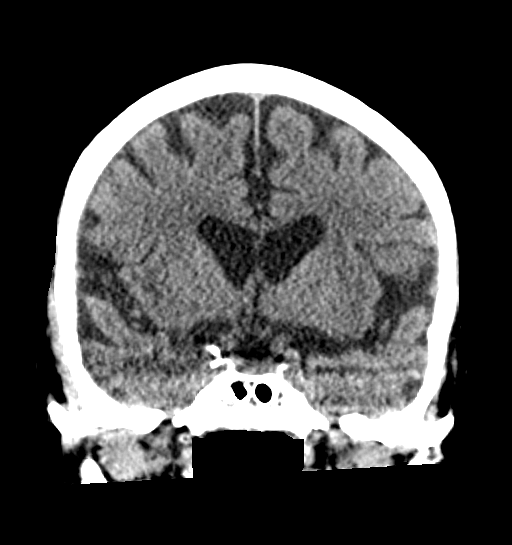
[im 33/72  brain]
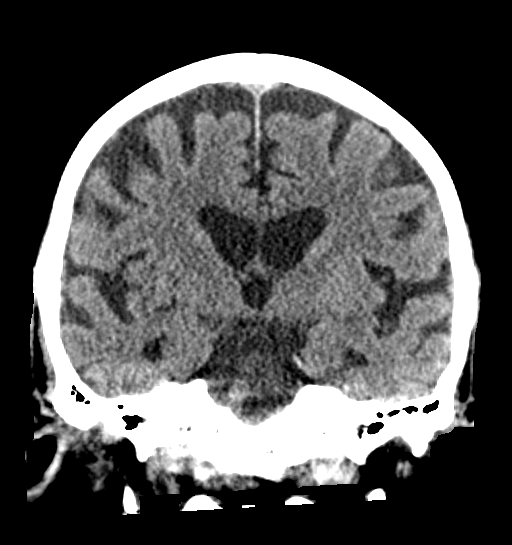
[im 39/72  brain]
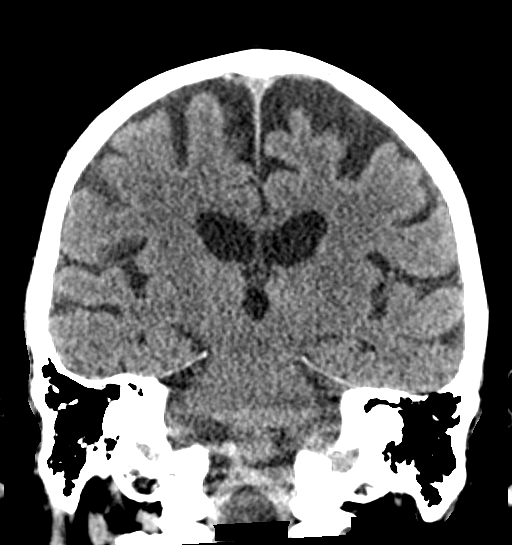

[Series 5: sagittal soft tissue · sagittal · 0.35mm/px · 3 of 56 slices shown]
[im 19/56  brain]
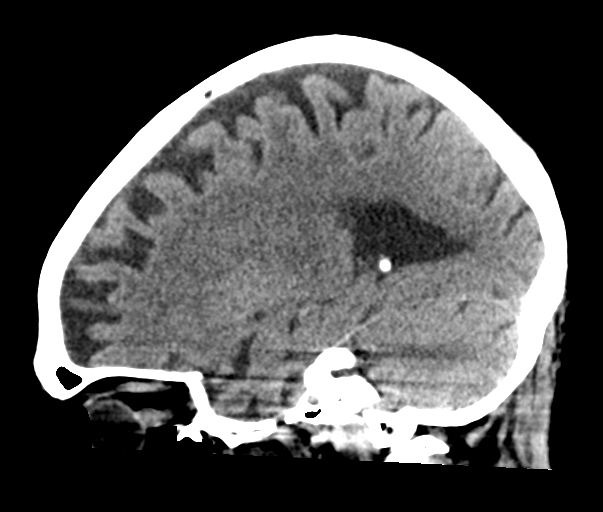
[im 28/56  brain]
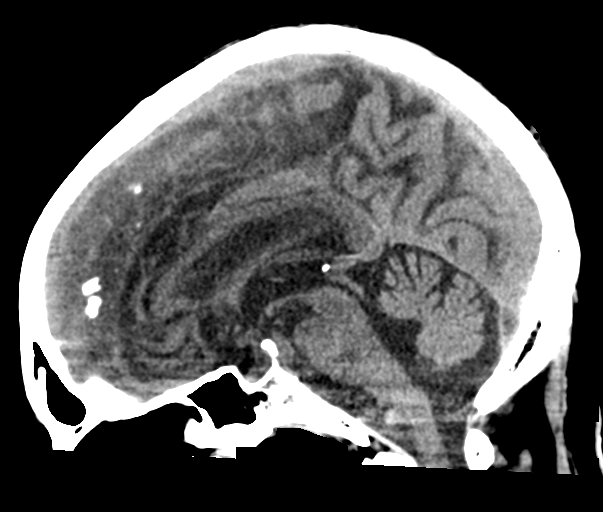
[im 37/56  brain]
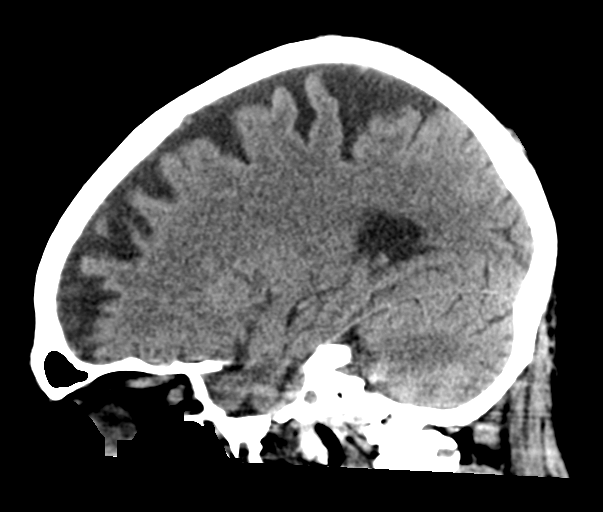

[17 of 47 positions shown; findings below may reference images not displayed]

FINDINGS: Brain:

Mild cerebral atrophy.

There is no acute intracranial hemorrhage.

No demarcated cortical infarct.

No extra-axial fluid collection.

No evidence of an intracranial mass.

No midline shift.

Vascular: No hyperdense vessel. Atherosclerotic calcifications.

Skull: No fracture or aggressive osseous lesion.

Sinuses/Orbits: No mass or acute finding within the imaged orbits.
No significant paranasal sinus disease at the imaged levels.
IMPRESSION: No evidence of acute intracranial abnormality.

Mild cerebral atrophy.

## 2022-06-18 SURGERY — ESOPHAGOGASTRODUODENOSCOPY (EGD) WITH PROPOFOL
Anesthesia: Monitor Anesthesia Care

## 2022-06-18 MED ORDER — PROPOFOL 10 MG/ML IV BOLUS
INTRAVENOUS | Status: DC | PRN
  Administered 2022-06-18 (×3): 20 mg via INTRAVENOUS

## 2022-06-18 MED ORDER — SODIUM CHLORIDE 0.9 % IV SOLN
1.0000 g | Freq: Three times a day (TID) | INTRAVENOUS | Status: DC
  Administered 2022-06-18 – 2022-06-24 (×18): 1 g via INTRAVENOUS
  Filled 2022-06-18: qty 1
  Filled 2022-06-18: qty 20
  Filled 2022-06-18 (×17): qty 1

## 2022-06-18 MED ORDER — TRACE MINERALS CU-MN-SE-ZN 300-55-60-3000 MCG/ML IV SOLN
INTRAVENOUS | Status: AC
  Filled 2022-06-18: qty 993.6

## 2022-06-18 MED ORDER — SODIUM CHLORIDE 0.9% IV SOLUTION: Freq: Once | INTRAVENOUS | Status: DC

## 2022-06-18 NOTE — Progress Notes (Signed)
Large liguid black stool in bed. Approx 300 ml

## 2022-06-18 NOTE — Assessment & Plan Note (Addendum)
Mental status waxes and wanes..  Will give as needed low-dose Seroquel at night. Need to continue pain medications.

## 2022-06-18 NOTE — Progress Notes (Signed)
Nutrition Follow-up  DOCUMENTATION CODES:   Obesity unspecified  INTERVENTION:   -TPN management per pharmacy -RD will follow for ability to resume enteral nutrition  NUTRITION DIAGNOSIS:   Inadequate oral intake related to acute illness as evidenced by NPO status.  Ongoing  GOAL:   Patient will meet greater than or equal to 90% of their needs  Progressing   MONITOR:   Diet advancement, Labs, Weight trends, Skin, I & O's  REASON FOR ASSESSMENT:   Consult Enteral/tube feeding initiation and management  ASSESSMENT:   76 y/o male with h/o DM, BPH, prostate cancer, hiatal hernia, kidney stones and HTN who is admitted with severe necrotizing pancreatitis, portal vein thrombosis, liver lesions, AKI and sepsis.  6/16- s/p fluoroscopy guided post pyloric NGT, HD initiated 6/19- rectal tube inserted 6/22- CRRT initiated, PICC placed, TPN initiated  Reviewed I/O's: -125 ml x 24 hours and +7.3 L since admission  UOP: 75 ml x 24 hours  NGT output: 25 ml x 24 hours  Rectal tube output: 25 ml x 24 hours  Per GI notes, plan for EGD today to eval GIB.   Pt remains NPO for NGT connected to low, intermittent suction. Pt currently receiving TPN at 45 ml/hr, which provides 1249 kcals and 60 grams protein, meeting 53% of estimated kcal needs and 46% of estimated protein needs. Case discussed with pharmacist; plan to advance TPN to goal rate of 90 ml/hr today (TPN will be concentrated), which will provide 2499 kcals and 149 grams protein, meeting 100% of estimated kcal and protein needs.   Medications reviewed and include miralax, senokot, protonix, and sodium chloride infusion @ 20 ml/hr.   Labs reviewed: CBGS: 191 (inpatient orders for glycemic control are 0-15 units insulin aspart every 4 hours).    Diet Order:   Diet Order             Diet NPO time specified  Diet effective midnight           Diet - low sodium heart healthy                   EDUCATION NEEDS:    Education needs have been addressed  Skin:  Skin Assessment: Reviewed RN Assessment  Last BM:  06/17/22 (via rectal tube)  Height:   Ht Readings from Last 1 Encounters:  06/10/22 6' (1.829 m)    Weight:   Wt Readings from Last 1 Encounters:  06/18/22 101.3 kg    Ideal Body Weight:  80.9 kg  BMI:  Body mass index is 30.29 kg/m.  Estimated Nutritional Needs:   Kcal:  9147-8295  Protein:  130-155 grams  Fluid:  > 2 L    Levada Schilling, RD, LDN, CDCES Registered Dietitian II Certified Diabetes Care and Education Specialist Please refer to [facility] for RD and/or RD on-call/weekend/after hours pager

## 2022-06-18 NOTE — Progress Notes (Signed)
[facility]  ROUNDING NOTE   Subjective:   Patient seen and evaluated at bedside in ICU  Ill-appearing Alert and oriented, not as conversive as previous days No pressors NPO, TPN at 45 ml/hr Pantoprazole and Octreotide drips CRRT continues net 0 Foley catheter-75 ml output   Objective:  Vital signs in last 24 hours:  Temp:  [97.9 F (36.6 C)-100.5 F (38.1 C)] 97.9 F (36.6 C) (06/23 1147) Pulse Rate:  [82-91] 90 (06/23 1147) Resp:  [20-32] 30 (06/23 1147) BP: (89-141)/(54-81) 98/60 (06/23 1200) SpO2:  [91 %-98 %] 93 % (06/23 1200) Weight:  [101.3 kg-103.3 kg] 103.3 kg (06/23 1108)  Weight change: 0.1 kg Filed Weights   06/17/22 0500 06/18/22 0417 06/18/22 1108  Weight: 101.4 kg 101.3 kg 103.3 kg    Intake/Output: I/O last 3 completed shifts: In: 3667.7 [I.V.:3377.7; Other:40; IV Piggyback:250] Out: 2775 [Urine:75; Emesis/NG output:325; Other:2300; Stool:75]   Intake/Output this shift:  Total I/O In: 420.6 [I.V.:370.6; IV Piggyback:50] Out: 808 [Other:508; Stool:300]  Physical Exam: General: NAD  Head: Normocephalic, atraumatic. Dry oral mucosal membranes  Eyes: Anicteric  Lungs:  Basilar rhonchi, normal effort, [place] O2  Heart: Regular rate and rhythm  Abdomen:  Firm, distended, mild diffuse tenderness, BS absent  Extremities: Trace peripheral edema.  Neurologic: Alert, simple responses  Skin: Warm, dry  Access: Left IJ dialysis catheter 06/01/2022.  [name].    Basic Metabolic Panel: Recent Labs  Lab 06/12/22 0449 06/13/22 0450 06/14/22 0423 06/15/22 0421 06/16/22 9528 06/17/22 0331 06/17/22 1015 06/17/22 1530 06/18/22 0407 06/18/22 0520  NA 136 136 135   < > 142 139 138 140  --  140  K 4.2 4.3 4.8   < > 4.9 5.5* 6.0* 5.5*  --  4.0  CL 100 99 98   < > 92* 95* 93* 96*  --  102  CO2 25 24 23    < > 31 28 26 25   --  27  GLUCOSE 156* 182* 210*   < > 172* 161* 142* 167*  --  185*  BUN 80* 83* 88*   < > 103* 90* 109* 113*  --  74*   CREATININE 6.76* 7.54* 9.65*   < > 9.18* 9.04* 9.37* 9.89*  --  5.68*  CALCIUM 4.0* 4.2* 4.2*   < > 5.7* 5.9* 5.9* 5.9*  --  7.7*  MG 1.8 2.1 2.2  --  2.6*  --   --   --  2.5* 2.2  PHOS 4.0 5.0* 6.8*  --   --   --   --  >30.0*  --  5.9*   < > = values in this interval not displayed.     Liver Function Tests: Recent Labs  Lab 06/13/22 0450 06/14/22 0423 06/15/22 0421 06/16/22 0628 06/17/22 0331 06/17/22 1015 06/17/22 1530 06/18/22 0520  AST 106* 75* 72* 106*  --  86*  --   --   ALT 105* 74* 64* 71*  --  58*  --   --   ALKPHOS 62 61 71 75  --  77  --   --   BILITOT 0.7 0.8 1.1 0.7  --  1.1  --   --   PROT 5.5* 5.3* 6.0* 6.3*  --  6.0*  --   --   ALBUMIN 2.3* 2.7* 2.6* 2.4* 2.4* 2.4* 2.2* 2.0*    Recent Labs  Lab 06/12/22 0449  LIPASE 102*    No results for input(s): "AMMONIA" in the last 168 hours.  CBC: Recent Labs  Lab 06/14/22 0423 06/15/22 0421 06/16/22 0628 06/16/22 1155 06/17/22 0331 06/18/22 0407  WBC 14.2* 22.6* 27.7*  --  25.0* 25.7*  NEUTROABS 12.1* 19.7* 24.5*  --  22.0* 24.2*  HGB 9.2* 9.7* 9.1* 8.2* 8.6* 8.0*  HCT 27.6* 28.5* 26.2*  --  25.5* 23.6*  MCV 79.8* 79.4* 77.7*  --  80.2 83.4  PLT 141* 185 246  --  265 253     Cardiac Enzymes: No results for input(s): "CKTOTAL", "CKMB", "CKMBINDEX", "TROPONINI" in the last 168 hours.   BNP: Invalid input(s): "POCBNP"  CBG: Recent Labs  Lab 06/17/22 1942 06/17/22 2332 06/18/22 0330 06/18/22 0741 06/18/22 1219  GLUCAP 175* 198* 184* 191* 179*     Microbiology: Results for orders placed or performed during the hospital encounter of 06/08/22  Blood culture (routine x 2)     Status: None   Collection Time: 06/08/22  1:33 PM   Specimen: BLOOD  Result Value Ref Range Status   Specimen Description BLOOD LEFT HAND  Final   Special Requests   Final    BOTTLES DRAWN AEROBIC AND ANAEROBIC Blood Culture results may not be optimal due to an inadequate volume of blood received in culture  bottles   Culture   Final    NO GROWTH 5 DAYS Performed at [facility], [address] [place], [place] [address]    Report Status 06/13/2022 FINAL  Final  Blood culture (routine x 2)     Status: None   Collection Time: 06/08/22  2:15 PM   Specimen: BLOOD  Result Value Ref Range Status   Specimen Description BLOOD BLOOD LEFT WRIST  Final   Special Requests   Final    BOTTLES DRAWN AEROBIC AND ANAEROBIC Blood Culture results may not be optimal due to an inadequate volume of blood received in culture bottles   Culture   Final    NO GROWTH 5 DAYS Performed at [facility], [address] [place], [place] [address]    Report Status 06/13/2022 FINAL  Final  MRSA Next Gen by PCR, Nasal     Status: None   Collection Time: 06/10/22  4:14 PM   Specimen: Nasal Mucosa; Nasal Swab  Result Value Ref Range Status   MRSA by PCR Next Gen NOT DETECTED NOT DETECTED Final    Comment: (NOTE) The GeneXpert MRSA Assay (FDA approved for NASAL specimens only), is one component of a comprehensive MRSA colonization surveillance program. It is not intended to diagnose MRSA infection nor to guide or monitor treatment for MRSA infections. Test performance is not FDA approved in patients less than 70 years old. Performed at [facility], [address] [place], [place] [address]   SARS Coronavirus 2 by RT PCR (hospital order, performed in [facility] hospital lab) *cepheid single result test* Anterior Nasal Swab     Status: None   Collection Time: 06/10/22  5:17 PM   Specimen: Anterior Nasal Swab  Result Value Ref Range Status   SARS Coronavirus 2 by RT PCR NEGATIVE NEGATIVE Final    Comment: (NOTE) SARS-CoV-2 target nucleic acids are NOT DETECTED.  The SARS-CoV-2 RNA is generally detectable in upper and lower respiratory specimens during the acute phase of infection. The lowest concentration of SARS-CoV-2 viral copies this assay can detect is 250 copies / mL. A  negative result does not preclude SARS-CoV-2 infection and should not be used as the sole basis for treatment or other patient management decisions.  A negative result may occur with improper specimen collection / handling, submission of specimen other than nasopharyngeal swab, presence of viral mutation(s) within the areas targeted by this assay, and inadequate number of viral copies (<250 copies / mL). A negative result must be combined with clinical observations, patient history, and epidemiological information.  Fact Sheet for Patients:   RoadLapTop.co.za  Fact Sheet for Healthcare Providers: http://kim-miller.com/  This test is not yet approved or  cleared by the [place] FDA and has been authorized for detection and/or diagnosis of SARS-CoV-2 by FDA under an Emergency Use Authorization (EUA).  This EUA will remain in effect (meaning this test can be used) for the duration of the COVID-19 declaration under Section 564(b)(1) of the Act, 21 U.S.C. section 360bbb-3(b)(1), unless the authorization is terminated or revoked sooner.  Performed at [facility], [address] [place], [place] [address]     Coagulation Studies: No results for input(s): "LABPROT", "INR" in the last 72 hours.   Urinalysis: No results for input(s): "COLORURINE", "LABSPEC", "PHURINE", "GLUCOSEU", "HGBUR", "BILIRUBINUR", "KETONESUR", "PROTEINUR", "UROBILINOGEN", "NITRITE", "LEUKOCYTESUR" in the last 72 hours.  Invalid input(s): "APPERANCEUR"     Imaging: CT HEAD WO CONTRAST ( )  Result Date: 06/18/2022 CLINICAL DATA:  Provided history: Delirium. EXAM: CT HEAD WITHOUT CONTRAST TECHNIQUE: Contiguous axial images were obtained from the base of the skull through the vertex without intravenous contrast. RADIATION DOSE REDUCTION: This exam was performed according to the departmental dose-optimization program which includes automated exposure  control, adjustment of the mA and/or kV according to patient size and/or use of iterative reconstruction technique. COMPARISON:  No pertinent prior exams available for comparison. FINDINGS: Brain: Mild cerebral atrophy. There is no acute intracranial hemorrhage. No demarcated cortical infarct. No extra-axial fluid collection. No evidence of an intracranial mass. No midline shift. Vascular: No hyperdense vessel. Atherosclerotic calcifications. Skull: No fracture or aggressive osseous lesion. Sinuses/Orbits: No mass or acute finding within the imaged orbits. No significant paranasal sinus disease at the imaged levels. IMPRESSION: No evidence of acute intracranial abnormality. Mild cerebral atrophy. Electronically Signed   By: Jackey Loge D.O.   On: 06/18/2022 13:37   [place] EKG SITE RITE  Result Date: 06/17/2022 If [facility] image not attached, placement could not be confirmed due to current cardiac rhythm.    Medications:    sodium chloride Stopped (06/13/22 2043)   pantoprazole 8 mg/hr (06/18/22 0700)   piperacillin-tazobactam 3.375 g (06/18/22 0905)   prismasol BGK 2/2.5 dialysis solution 1,500 mL/hr at 06/18/22 0853   prismasol BGK 2/2.5 replacement solution 300 mL/hr at 06/18/22 0831   prismasol BGK 2/2.5 replacement solution 300 mL/hr at 06/18/22 0844   TPN ADULT (ION) 45 mL/hr at 06/18/22 0700   TPN ADULT (ION)      sodium chloride   Intravenous Once   Chlorhexidine Gluconate Cloth  6 each Topical Q0600   insulin aspart  0-15 Units Subcutaneous Q4H   multivitamin  1 tablet Per Tube QHS   [START ON 06/19/2022] pantoprazole  40 mg Intravenous Q12H   polyethylene glycol  17 g Per Tube Daily   QUEtiapine  100 mg Per Tube QHS   sennosides  5 mL Per Tube BID   sodium chloride flush  10-40 mL Intracatheter Q12H   sodium chloride, acetaminophen **OR** acetaminophen, alteplase, guaiFENesin-dextromethorphan, heparin, HYDROmorphone (DILAUDID) injection, hydrOXYzine, ipratropium-albuterol, lidocaine  (PF), lidocaine-prilocaine, metoCLOPramide (REGLAN) injection, mouth rinse, oxyCODONE, pentafluoroprop-tetrafluoroeth, polyethylene glycol, prochlorperazine, sodium chloride, sodium chloride flush  Assessment/ Plan:  Xavier Esparza  is a 76 y.o.  male with past medical history consistent of prostate cancer, diabetes, recurrent kidney stones with lithotripsy and BPH, who was admitted to [facility] on 06/08/2022 for Portal vein thrombosis [I81] Lactic acidosis [E87.20] Necrotizing pancreatitis [K85.91] Liver mass [R16.0] Syncope, unspecified syncope type [R55] Leukocytosis, unspecified type [D72.829] Acute pancreatitis, unspecified complication status, unspecified pancreatitis type [K85.90]   Acute kidney injury with gross hematuria likely secondary to severe illness and IV contrast exposure.  Normal kidney function prior to admission.  IV contrast exposure on 06/08/2022 and 06/09/2022.  Renal ultrasound negative for obstruction.  Urinalysis shows hematuria and mild proteinuria.   ?  Gross hematuria related to Foley catheter.  Patient has history of prostate cancer.   Received first dialysis treatment on June 16.   Patient remains on CRRT, currently net 0, ordered UF 75 ml/hr. Tolerating well. Will continue CRRT for now.   Lab Results  Component Value Date   CREATININE 5.68 (H) 06/18/2022   CREATININE 9.89 (H) 06/17/2022   CREATININE 9.37 (H) 06/17/2022    Intake/Output Summary (Last 24 hours) at 06/18/2022 1341 Last data filed at 06/18/2022 1200 Gross per 24 hour  Intake 2171.07 ml  Output 3233 ml  Net -1061.93 ml    Acute pancreatitis, severe necrotizing.  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   Remains NPO.  Supportive care with antiemetics and pain control.  Receiving antibiotic therapy managed by primary team.  Receiving TPN.    Diabetes mellitus type 2 without complication.Home regimen includes metformin.  Hemoglobin A1c 6.2 on 01/08/2022.   -Agree with holding metformin. - SSI managed by primary team  Acute ileus, receiving IV narcotics for severe acute pancreatitis. Tube feeds stopped and NGT placed to LWIS. Rectal tube placed  EGD rescheduled till today.  Results show 3 gastric ulcers, no active bleeding noted.  Continue current treatments per GI.   LOS: 10 Yeriel Mineo 6/23/20231:41 PM

## 2022-06-19 DIAGNOSIS — D62 Acute posthemorrhagic anemia: Secondary | ICD-10-CM | POA: Diagnosis not present

## 2022-06-19 DIAGNOSIS — G9341 Metabolic encephalopathy: Secondary | ICD-10-CM | POA: Diagnosis not present

## 2022-06-19 DIAGNOSIS — K8591 Acute pancreatitis with uninfected necrosis, unspecified: Secondary | ICD-10-CM | POA: Diagnosis not present

## 2022-06-19 DIAGNOSIS — A419 Sepsis, unspecified organism: Secondary | ICD-10-CM | POA: Diagnosis not present

## 2022-06-19 DIAGNOSIS — K922 Gastrointestinal hemorrhage, unspecified: Secondary | ICD-10-CM | POA: Diagnosis not present

## 2022-06-19 DIAGNOSIS — N179 Acute kidney failure, unspecified: Secondary | ICD-10-CM | POA: Diagnosis not present

## 2022-06-19 LAB — CBC WITH DIFFERENTIAL/PLATELET
Abs Immature Granulocytes: 0.82 10*3/uL — ABNORMAL HIGH (ref 0.00–0.07)
Basophils Absolute: 0.1 10*3/uL (ref 0.0–0.1)
Basophils Relative: 0 %
Eosinophils Absolute: 0.1 10*3/uL (ref 0.0–0.5)
Eosinophils Relative: 0 %
HCT: 26.4 % — ABNORMAL LOW (ref 39.0–52.0)
Hemoglobin: 8.5 g/dL — ABNORMAL LOW (ref 13.0–17.0)
Immature Granulocytes: 3 %
Lymphocytes Relative: 3 %
Lymphs Abs: 0.7 10*3/uL (ref 0.7–4.0)
MCH: 26.8 pg (ref 26.0–34.0)
MCHC: 32.2 g/dL (ref 30.0–36.0)
MCV: 83.3 fL (ref 80.0–100.0)
Monocytes Absolute: 1.1 10*3/uL — ABNORMAL HIGH (ref 0.1–1.0)
Monocytes Relative: 5 %
Neutro Abs: 21.7 10*3/uL — ABNORMAL HIGH (ref 1.7–7.7)
Neutrophils Relative %: 89 %
Platelets: 237 10*3/uL (ref 150–400)
RBC: 3.17 MIL/uL — ABNORMAL LOW (ref 4.22–5.81)
RDW: 16 % — ABNORMAL HIGH (ref 11.5–15.5)
WBC: 24.5 10*3/uL — ABNORMAL HIGH (ref 4.0–10.5)
nRBC: 0.3 % — ABNORMAL HIGH (ref 0.0–0.2)

## 2022-06-19 LAB — RENAL FUNCTION PANEL
Albumin: 2.3 g/dL — ABNORMAL LOW (ref 3.5–5.0)
Albumin: 2 g/dL — ABNORMAL LOW (ref 3.5–5.0)
Anion gap: 9 (ref 5–15)
Anion gap: 8 (ref 5–15)
BUN: 73 mg/dL — ABNORMAL HIGH (ref 8–23)
BUN: 69 mg/dL — ABNORMAL HIGH (ref 8–23)
CO2: 23 mmol/L (ref 22–32)
CO2: 24 mmol/L (ref 22–32)
Calcium: 7.7 mg/dL — ABNORMAL LOW (ref 8.9–10.3)
Calcium: 8 mg/dL — ABNORMAL LOW (ref 8.9–10.3)
Chloride: 103 mmol/L (ref 98–111)
Chloride: 104 mmol/L (ref 98–111)
Creatinine, Ser: 5.71 mg/dL — ABNORMAL HIGH (ref 0.61–1.24)
Creatinine, Ser: 5.29 mg/dL — ABNORMAL HIGH (ref 0.61–1.24)
GFR, Estimated: 10 mL/min — ABNORMAL LOW (ref >60)
GFR, Estimated: 11 mL/min — ABNORMAL LOW (ref >60)
Glucose, Bld: 212 mg/dL — ABNORMAL HIGH (ref 70–99)
Glucose, Bld: 198 mg/dL — ABNORMAL HIGH (ref 70–99)
Phosphorus: 5.9 mg/dL — ABNORMAL HIGH (ref 2.5–4.6)
Phosphorus: 5 mg/dL — ABNORMAL HIGH (ref 2.5–4.6)
Potassium: 4.1 mmol/L (ref 3.5–5.1)
Potassium: 4 mmol/L (ref 3.5–5.1)
Sodium: 135 mmol/L (ref 135–145)
Sodium: 136 mmol/L (ref 135–145)

## 2022-06-19 LAB — HEPATIC FUNCTION PANEL
ALT: 32 U/L (ref 0–44)
AST: 41 U/L (ref 15–41)
Albumin: 2 g/dL — ABNORMAL LOW (ref 3.5–5.0)
Alkaline Phosphatase: 89 U/L (ref 38–126)
Bilirubin, Direct: 0.2 mg/dL (ref 0.0–0.2)
Indirect Bilirubin: 0.4 mg/dL (ref 0.3–0.9)
Total Bilirubin: 0.6 mg/dL (ref 0.3–1.2)
Total Protein: 5.3 g/dL — ABNORMAL LOW (ref 6.5–8.1)

## 2022-06-19 LAB — BLOOD GAS, ARTERIAL
Acid-base deficit: 3.2 mmol/L — ABNORMAL HIGH (ref 0.0–2.0)
Bicarbonate: 23.6 mmol/L (ref 20.0–28.0)
O2 Saturation: 98.3 %
Patient temperature: 37
pCO2 arterial: 48 mmHg (ref 32–48)
pH, Arterial: 7.3 — ABNORMAL LOW (ref 7.35–7.45)
pO2, Arterial: 109 mmHg — ABNORMAL HIGH (ref 83–108)

## 2022-06-19 LAB — HEMOGLOBIN AND HEMATOCRIT, BLOOD
HCT: 23.7 % — ABNORMAL LOW (ref 39.0–52.0)
Hemoglobin: 7.7 g/dL — ABNORMAL LOW (ref 13.0–17.0)

## 2022-06-19 LAB — GLUCOSE, CAPILLARY
Glucose-Capillary: 185 mg/dL — ABNORMAL HIGH (ref 70–99)
Glucose-Capillary: 191 mg/dL — ABNORMAL HIGH (ref 70–99)
Glucose-Capillary: 193 mg/dL — ABNORMAL HIGH (ref 70–99)
Glucose-Capillary: 204 mg/dL — ABNORMAL HIGH (ref 70–99)
Glucose-Capillary: 206 mg/dL — ABNORMAL HIGH (ref 70–99)
Glucose-Capillary: 233 mg/dL — ABNORMAL HIGH (ref 70–99)

## 2022-06-19 LAB — TRIGLYCERIDES: Triglycerides: 183 mg/dL — ABNORMAL HIGH (ref <150)

## 2022-06-19 LAB — LACTIC ACID, PLASMA: Lactic Acid, Venous: 1.3 mmol/L (ref 0.5–1.9)

## 2022-06-19 LAB — AMMONIA: Ammonia: 20 umol/L (ref 9–35)

## 2022-06-19 LAB — PSA: Prostatic Specific Antigen: 0.5 ng/mL (ref 0.00–4.00)

## 2022-06-19 LAB — MAGNESIUM: Magnesium: 2.1 mg/dL (ref 1.7–2.4)

## 2022-06-19 LAB — HIV ANTIBODY (ROUTINE TESTING W REFLEX): HIV Screen 4th Generation wRfx: NONREACTIVE

## 2022-06-19 LAB — PREPARE RBC (CROSSMATCH)

## 2022-06-19 MED ORDER — HEPARIN SODIUM (PORCINE) 5000 UNIT/ML IJ SOLN
250.0000 [IU]/h | INTRAMUSCULAR | Status: DC
Start: 2022-06-19 — End: 2022-06-24
  Administered 2022-06-19 – 2022-06-23 (×5): 250 [IU]/h via INTRAVENOUS_CENTRAL
  Filled 2022-06-19 (×6): qty 2

## 2022-06-19 MED ORDER — SODIUM CHLORIDE 0.9 % IV SOLN: INTRAVENOUS | Status: DC

## 2022-06-19 MED ORDER — FREE WATER
50.0000 mL | Freq: Three times a day (TID) | Status: DC
Start: 2022-06-19 — End: 2022-06-19

## 2022-06-19 MED ORDER — SODIUM CHLORIDE 0.9 % IV SOLN: 20.0000 mL | Freq: Once | INTRAVENOUS | Status: DC

## 2022-06-19 MED ORDER — FLUCONAZOLE IN SODIUM CHLORIDE 400-0.9 MG/200ML-% IV SOLN
800.0000 mg | INTRAVENOUS | Status: DC
  Administered 2022-06-19 – 2022-06-20 (×3): 400 mg via INTRAVENOUS
  Administered 2022-06-21 – 2022-06-22 (×2): 800 mg via INTRAVENOUS
  Administered 2022-06-23 (×2): 400 mg via INTRAVENOUS
  Filled 2022-06-19 (×5): qty 400

## 2022-06-19 MED ORDER — TRACE MINERALS CU-MN-SE-ZN 300-55-60-3000 MCG/ML IV SOLN
INTRAVENOUS | Status: AC
  Filled 2022-06-19: qty 993.6

## 2022-06-19 MED ORDER — HEPARIN (PORCINE) IN NACL 2-0.9 UNITS/ML: INTRAMUSCULAR | Status: DC

## 2022-06-19 MED ORDER — HALOPERIDOL LACTATE 5 MG/ML IJ SOLN
1.0000 mg | Freq: Four times a day (QID) | INTRAMUSCULAR | Status: DC | PRN
  Administered 2022-06-19 – 2022-06-24 (×8): 1 mg via INTRAVENOUS
  Filled 2022-06-19 (×8): qty 1

## 2022-06-19 MED ORDER — FREE WATER
100.0000 mL | Freq: Three times a day (TID) | Status: DC
Start: 2022-06-19 — End: 2022-06-23
  Administered 2022-06-20 – 2022-06-21 (×4): 100 mL

## 2022-06-19 MED ORDER — NEPRO/CARBSTEADY PO LIQD: 1000.0000 mL | ORAL | Status: DC

## 2022-06-19 MED ORDER — ALBUMIN HUMAN 25 % IV SOLN
25.0000 g | Freq: Once | INTRAVENOUS | Status: AC
  Administered 2022-06-19: 25 g via INTRAVENOUS
  Filled 2022-06-19: qty 100

## 2022-06-19 MED ORDER — BLISTEX MEDICATED EX OINT
TOPICAL_OINTMENT | CUTANEOUS | Status: DC | PRN
Start: 2022-06-19 — End: 2022-06-29
  Administered 2022-06-19 – 2022-06-27 (×2): 1 via TOPICAL
  Filled 2022-06-19 (×2): qty 6.3

## 2022-06-19 MED ORDER — NEPRO/CARBSTEADY PO LIQD
1000.0000 mL | ORAL | Status: DC
  Administered 2022-06-19: 1000 mL

## 2022-06-19 MED ORDER — SODIUM CHLORIDE 0.9% IV SOLUTION: Freq: Once | INTRAVENOUS | Status: AC

## 2022-06-19 NOTE — Progress Notes (Signed)
Cross Cover  Hypotension responded well to one dose albumin 25 gm. However, still having positional issues with access hindering continuous therapy. Recommend vascular consult for new access

## 2022-06-19 NOTE — Progress Notes (Signed)
[facility]  ROUNDING NOTE   Subjective:   Patient was seen in ICU, patient is awake but not oriented.  Patient does respond in yes and no but does not offer any specific complaints   Objective:  Vital signs in last 24 hours:  Temp:  [97.9 F (36.6 C)-100.9 F (38.3 C)] 99.6 F (37.6 C) (06/24 0600) Pulse Rate:  [85-99] 89 (06/24 0800) Resp:  [12-39] 12 (06/24 0800) BP: (72-134)/(42-109) 129/58 (06/24 0800) SpO2:  [89 %-100 %] 95 % (06/24 0800) Weight:  [100 kg-103.3 kg] 100 kg (06/24 0411)  Weight change: 2 kg Filed Weights   06/18/22 0417 06/18/22 1108 06/19/22 0411  Weight: 101.3 kg 103.3 kg 100 kg    Intake/Output: I/O last 3 completed shifts: In: 3909.4 [I.V.:2926.7; Blood:337.5; Other:30; NG/GT:115; IV Piggyback:500.2] Out: Hart.Albright [Urine:65; Emesis/NG output:80; ZOXWR:6045; Stool:420]   Intake/Output this shift:  Total I/O In: 140 [I.V.:110; Other:30] Out: 58 [Urine:5; Emesis/NG output:80]  Physical Exam: General: NAD  Head: Normocephalic, atraumatic. Dry oral mucosal membranes  Eyes: Anicteric  Lungs:  Basilar rhonchi, normal effort, [place] O2  Heart: Regular rate and rhythm  Abdomen:  Firm, distended, mild diffuse tenderness, BS absent  Extremities: Trace peripheral edema.  Neurologic: Alert, simple responses  Skin: Warm, dry  Access: Left IJ dialysis catheter 06/01/2022.  [name].    Basic Metabolic Panel: Recent Labs  Lab 06/14/22 0423 06/15/22 0421 06/16/22 4098 06/17/22 0331 06/17/22 1015 06/17/22 1530 06/18/22 0407 06/18/22 0520 06/18/22 1943 06/19/22 0406  NA 135   < > 142   < > 138 140  --  140 139 135  K 4.8   < > 4.9   < > 6.0* 5.5*  --  4.0 4.4 4.1  CL 98   < > 92*   < > 93* 96*  --  102 103 103  CO2 23   < > 31   < > 26 25  --  27 24 23   GLUCOSE 210*   < > 172*   < > 142* 167*  --  185* 212* 212*  BUN 88*   < > 103*   < > 109* 113*  --  74* 77* 73*  CREATININE 9.65*   < > 9.18*   < > 9.37* 9.89*  --  5.68* 6.32* 5.71*   CALCIUM 4.2*   < > 5.7*   < > 5.9* 5.9*  --  7.7* 7.6* 7.7*  MG 2.2  --  2.6*  --   --   --  2.5* 2.2  --  2.1  PHOS 6.8*  --   --   --   --  >30.0*  --  5.9* 6.6* 5.9*   < > = values in this interval not displayed.    Liver Function Tests: Recent Labs  Lab 06/13/22 0450 06/14/22 0423 06/15/22 0421 06/16/22 0628 06/17/22 0331 06/17/22 1015 06/17/22 1530 06/18/22 0520 06/18/22 1943 06/19/22 0406  AST 106* 75* 72* 106*  --  86*  --   --   --   --   ALT 105* 74* 64* 71*  --  58*  --   --   --   --   ALKPHOS 62 61 71 75  --  77  --   --   --   --   BILITOT 0.7 0.8 1.1 0.7  --  1.1  --   --   --   --   PROT 5.5* 5.3* 6.0* 6.3*  --  6.0*  --   --   --   --   ALBUMIN 2.3* 2.7* 2.6* 2.4*   < > 2.4* 2.2* 2.0* 2.0* 2.3*   < > = values in this interval not displayed.   No results for input(s): "LIPASE", "AMYLASE" in the last 168 hours. No results for input(s): "AMMONIA" in the last 168 hours.   CBC: Recent Labs  Lab 06/15/22 0421 06/16/22 0628 06/16/22 1155 06/17/22 0331 06/18/22 0407 06/19/22 0216  WBC 22.6* 27.7*  --  25.0* 25.7* 24.5*  NEUTROABS 19.7* 24.5*  --  22.0* 24.2* 21.7*  HGB 9.7* 9.1* 8.2* 8.6* 8.0* 8.5*  HCT 28.5* 26.2*  --  25.5* 23.6* 26.4*  MCV 79.4* 77.7*  --  80.2 83.4 83.3  PLT 185 246  --  265 253 237    Cardiac Enzymes: No results for input(s): "CKTOTAL", "CKMB", "CKMBINDEX", "TROPONINI" in the last 168 hours.   BNP: Invalid input(s): "POCBNP"  CBG: Recent Labs  Lab 06/18/22 1548 06/18/22 1938 06/18/22 2347 06/19/22 0354 06/19/22 0748  GLUCAP 191* 196* 193* 233* 206*    Microbiology: Results for orders placed or performed during the hospital encounter of 06/08/22  Blood culture (routine x 2)     Status: None   Collection Time: 06/08/22  1:33 PM   Specimen: BLOOD  Result Value Ref Range Status   Specimen Description BLOOD LEFT HAND  Final   Special Requests   Final    BOTTLES DRAWN AEROBIC AND ANAEROBIC Blood Culture results may  not be optimal due to an inadequate volume of blood received in culture bottles   Culture   Final    NO GROWTH 5 DAYS Performed at [facility], [address] [place], [place] [address]    Report Status 06/13/2022 FINAL  Final  Blood culture (routine x 2)     Status: None   Collection Time: 06/08/22  2:15 PM   Specimen: BLOOD  Result Value Ref Range Status   Specimen Description BLOOD BLOOD LEFT WRIST  Final   Special Requests   Final    BOTTLES DRAWN AEROBIC AND ANAEROBIC Blood Culture results may not be optimal due to an inadequate volume of blood received in culture bottles   Culture   Final    NO GROWTH 5 DAYS Performed at [facility], [address] [place], [place] [address]    Report Status 06/13/2022 FINAL  Final  MRSA Next Gen by PCR, Nasal     Status: None   Collection Time: 06/10/22  4:14 PM   Specimen: Nasal Mucosa; Nasal Swab  Result Value Ref Range Status   MRSA by PCR Next Gen NOT DETECTED NOT DETECTED Final    Comment: (NOTE) The GeneXpert MRSA Assay (FDA approved for NASAL specimens only), is one component of a comprehensive MRSA colonization surveillance program. It is not intended to diagnose MRSA infection nor to guide or monitor treatment for MRSA infections. Test performance is not FDA approved in patients less than 49 years old. Performed at [facility], [address] [place], [place] [address]   SARS Coronavirus 2 by RT PCR (hospital order, performed in [facility] hospital lab) *cepheid single result test* Anterior Nasal Swab     Status: None   Collection Time: 06/10/22  5:17 PM   Specimen: Anterior Nasal Swab  Result Value Ref Range Status   SARS Coronavirus 2 by RT PCR NEGATIVE NEGATIVE Final    Comment: (NOTE) SARS-CoV-2 target nucleic acids are NOT  DETECTED.  The SARS-CoV-2 RNA is generally detectable in upper and lower respiratory specimens during the acute phase of infection. The lowest concentration of  SARS-CoV-2 viral copies this assay can detect is 250 copies / mL. A negative result does not preclude SARS-CoV-2 infection and should not be used as the sole basis for treatment or other patient management decisions.  A negative result may occur with improper specimen collection / handling, submission of specimen other than nasopharyngeal swab, presence of viral mutation(s) within the areas targeted by this assay, and inadequate number of viral copies (<250 copies / mL). A negative result must be combined with clinical observations, patient history, and epidemiological information.  Fact Sheet for Patients:   RoadLapTop.co.za  Fact Sheet for Healthcare Providers: http://kim-miller.com/  This test is not yet approved or  cleared by the [place] FDA and has been authorized for detection and/or diagnosis of SARS-CoV-2 by FDA under an Emergency Use Authorization (EUA).  This EUA will remain in effect (meaning this test can be used) for the duration of the COVID-19 declaration under Section 564(b)(1) of the Act, 21 U.S.C. section 360bbb-3(b)(1), unless the authorization is terminated or revoked sooner.  Performed at [facility], [address] [place], [place] [address]   Culture, blood (Routine X 2) w Reflex to ID Panel     Status: None (Preliminary result)   Collection Time: 06/18/22 10:03 AM   Specimen: BLOOD  Result Value Ref Range Status   Specimen Description BLOOD RIGHT HAND  Final   Special Requests   Final    BOTTLES DRAWN AEROBIC ONLY Blood Culture adequate volume   Culture   Final    NO GROWTH < 24 HOURS Performed at [facility], [address], [place], [place] [address]    Report Status PENDING  Incomplete  Culture, blood (Routine X 2) w Reflex to ID Panel     Status: None (Preliminary result)   Collection Time: 06/18/22 10:20 AM   Specimen: BLOOD  Result Value Ref Range Status   Specimen  Description BLOOD LEFT AC  Final   Special Requests   Final    BOTTLES DRAWN AEROBIC AND ANAEROBIC Blood Culture adequate volume   Culture   Final    NO GROWTH < 24 HOURS Performed at [facility], [address] [place], [place] [address]    Report Status PENDING  Incomplete    Coagulation Studies: No results for input(s): "LABPROT", "INR" in the last 72 hours.   Urinalysis: No results for input(s): "COLORURINE", "LABSPEC", "PHURINE", "GLUCOSEU", "HGBUR", "BILIRUBINUR", "KETONESUR", "PROTEINUR", "UROBILINOGEN", "NITRITE", "LEUKOCYTESUR" in the last 72 hours.  Invalid input(s): "APPERANCEUR"     Imaging: CT ABDOMEN PELVIS WO CONTRAST  Result Date: 06/18/2022 CLINICAL DATA:  Acute severe pancreatitis EXAM: CT ABDOMEN AND PELVIS WITHOUT CONTRAST TECHNIQUE: Multidetector CT imaging of the abdomen and pelvis was performed following the standard protocol without IV contrast. RADIATION DOSE REDUCTION: This exam was performed according to the departmental dose-optimization program which includes automated exposure control, adjustment of the mA and/or kV according to patient size and/or use of iterative reconstruction technique. COMPARISON:  None Available. FINDINGS: Lower chest: Bibasilar effusions and passive atelectasis not changed. No pericardial fluid. Hepatobiliary: Segmental hypodensities in the medial RIGHT hepatic lobe remain concerning for hepatic abscess. Additional low-density hepatic cysts again noted. No change. Gallbladder unchanged. Pancreas: There is interval increase in edema inflammation of the pancreas poor definition of the border. There is a lesion of low attenuation within the mid  body pancreas measuring 4 cm x 2.4 cm (35/2 which is poorly defined but appears new from prior. No clear organized fluid collections. There is interval increase in the omental mesenteric thickening associated with acute pancreatitis. Suggestion of multiple small nodules within the omentum  and retroperitoneum associated with the fluid collections. For example in Morrison's pouch multiple small nodules. Representative hazy thickening along the anterior LEFT and RIGHT pararenal fascia. Injection of the retroperitoneal fat within the pararenal space and deep the pararenal space on the RIGHT for example (image 50/2). Increased thickening along the LEFT pararenal space (image 39/2). No organized fluid collections. Spleen: Normal spleen Adrenals/urinary tract: Adrenal glands and kidneys normal. Nonobstructing calculus are RIGHT kidney. Foley catheter in bladder. Stomach/Bowel: NG tube in stomach. Duodenum small bowel grossly normal. Appendix normal. Multiple diverticula of the descending colon and sigmoid colon without acute inflammation. Fluid stool in the rectum. Rectal tube in place Vascular/Lymphatic: Abdominal aorta is normal caliber. No periportal or retroperitoneal adenopathy. No pelvic adenopathy. Reproductive: Unremarkable Other: No free fluid. Musculoskeletal: No aggressive osseous lesion. IMPRESSION: 1. Increased nodularity associated with the omental inflammation and inflammatory thickening along the retroperitoneal perirenal space. This nodularity suggest progression of pancreatic inflammation versus potentially pancreatic adenocarcinoma metastasis. Recommend correlation with appropriate tumor markers. 2. Pancreas is less well-defined. There is a low-density region within the mid body the pancreas. No clear organized fluid collections are present. 3. Cluster of low-attenuation lesion in the RIGHT hepatic lobe could represent hepatic abscess. Cannot exclude malignancy. Electronically Signed   By: Genevive Bi M.D.   On: 06/18/2022 13:56   CT HEAD WO CONTRAST ( )  Result Date: 06/18/2022 CLINICAL DATA:  Provided history: Delirium. EXAM: CT HEAD WITHOUT CONTRAST TECHNIQUE: Contiguous axial images were obtained from the base of the skull through the vertex without intravenous contrast.  RADIATION DOSE REDUCTION: This exam was performed according to the departmental dose-optimization program which includes automated exposure control, adjustment of the mA and/or kV according to patient size and/or use of iterative reconstruction technique. COMPARISON:  No pertinent prior exams available for comparison. FINDINGS: Brain: Mild cerebral atrophy. There is no acute intracranial hemorrhage. No demarcated cortical infarct. No extra-axial fluid collection. No evidence of an intracranial mass. No midline shift. Vascular: No hyperdense vessel. Atherosclerotic calcifications. Skull: No fracture or aggressive osseous lesion. Sinuses/Orbits: No mass or acute finding within the imaged orbits. No significant paranasal sinus disease at the imaged levels. IMPRESSION: No evidence of acute intracranial abnormality. Mild cerebral atrophy. Electronically Signed   By: Jackey Loge D.O.   On: 06/18/2022 13:37   [place] EKG SITE RITE  Result Date: 06/17/2022 If [facility] image not attached, placement could not be confirmed due to current cardiac rhythm.    Medications:    sodium chloride 10 mL/hr at 06/19/22 0800   heparin 10,000 units/ 20 mL infusion syringe     meropenem (MERREM) IV Stopped (06/19/22 0534)   pantoprazole 8 mg/hr (06/19/22 0800)   prismasol BGK 2/2.5 dialysis solution Stopped (06/19/22 0710)   prismasol BGK 2/2.5 replacement solution Stopped (06/19/22 0710)   prismasol BGK 2/2.5 replacement solution Stopped (06/19/22 0710)   TPN ADULT (ION) 90 mL/hr at 06/19/22 0800   TPN ADULT (ION)      sodium chloride   Intravenous Once   Chlorhexidine Gluconate Cloth  6 each Topical Q0600   insulin aspart  0-15 Units Subcutaneous Q4H   multivitamin  1 tablet Per Tube QHS   pantoprazole  40 mg Intravenous Q12H   polyethylene  glycol  17 g Per Tube Daily   sodium chloride flush  10-40 mL Intracatheter Q12H   sodium chloride, acetaminophen **OR** acetaminophen, alteplase, guaiFENesin-dextromethorphan,  heparin, HYDROmorphone (DILAUDID) injection, hydrOXYzine, ipratropium-albuterol, lidocaine (PF), lidocaine-prilocaine, mouth rinse, oxyCODONE, pentafluoroprop-tetrafluoroeth, polyethylene glycol, sodium chloride, sodium chloride flush  Assessment/ Plan:  Mr. CY BARRISH is a 76 y.o.  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  Normal kidney function prior to admission.  IV contrast exposure on 06/08/2022 and 06/09/2022.  Renal ultrasound negative for obstruction.  Urinalysis shows hematuria and mild proteinuria.   ?  Gross hematuria related to Foley catheter.  Patient has history of prostate cancer.   Received first dialysis treatment on June 16.   Patient remains on CRRT, currently net 0, ordered UF 0 ml/hr. Tolerating well.  Patient had multiple issues with CRRT today patient was having clotting of his circuit/dialyzer.Patient was earlier not on heparin I then had extensive discussion with the ICU team and the primary team.  We were concerned regarding patient's chances of bleeding as patient has gastric ulcers.  Patient gastric ulcer currently not bleeding and in the past 24 hours we had to change to dialyzer 4 times which worsens the anemia so after discussion with the ICU team and the primary team I decided to add heparin.Patient thereafter had catheter malfunction secondary to patient being confused and moving.  ICU team was at bedside.Will continue CRRT for now.   Lab Results  Component Value  Date   CREATININE 5.71 (H) 06/19/2022   CREATININE 6.32 (H) 06/18/2022   CREATININE 5.68 (H) 06/18/2022    Intake/Output Summary (Last 24 hours) at 06/19/2022 0920 Last data filed at 06/19/2022 0800 Gross per 24 hour  Intake 2860.86 ml  Output 4578 ml  Net -1717.14 ml   Acute pancreatitis, severe necrotizing.  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   Remains NPO.  Supportive care with antiemetics and pain control.  Receiving antibiotic therapy managed by primary team.  Receiving TPN.    Diabetes mellitus type 2 without complication.Home regimen includes metformin.  Hemoglobin A1c 6.2 on 01/08/2022.  -Agree with holding metformin. - SSI managed by primary team  Acute ileus, receiving IV narcotics for severe acute pancreatitis. Tube feeds stopped and NGT placed to LWIS. Rectal tube placed  EGD was done yesterday it showed 3 gastric ulcers, no active bleeding noted.  Continue current treatments per GI.   I spent more than 45 minutes in patient's critical care as patient requires CRRT/was having issues with the circuit/has complicated medical history/discussed the case at length with ICU team/primary team   LOS: 11 Syre Knerr s [facility] 6/24/20239:20 AM

## 2022-06-19 NOTE — Progress Notes (Signed)
  Interdisciplinary Goals of Care Family Meeting   Date carried out: 06/19/2022  Location of the meeting: Bedside  Member's involved: Patient's wife  Durable Power of Attorney or Environmental health practitioner: Patient's wife  Discussion: We discussed goals of care for Betsey Holiday .  Nursing staff present at the bedside.  We discussed what to do an emergency situation.  If his heart stops, the patient's wife would like to proceed with CPR at this point.  I explained that he is very sick and we are supporting numerous organs.  The patient is on continuous dialysis to support his kidney function.  Patient had a recent GI bleed with duodenal ulcers.  Patient has severe necrotizing pancreatitis and on empiric antibiotics.  Patient also has acute metabolic encephalopathy.  The patient is critically ill.  Code status: Full Code  Disposition: To be determined based on clinical course  Time spent for the meeting: 15 minutes   Alford Highland, MD  06/19/2022, 12:11 PM

## 2022-06-20 ENCOUNTER — Encounter: Payer: Self-pay | Admitting: Internal Medicine

## 2022-06-20 DIAGNOSIS — R55 Syncope and collapse: Secondary | ICD-10-CM | POA: Diagnosis not present

## 2022-06-20 DIAGNOSIS — K8591 Acute pancreatitis with uninfected necrosis, unspecified: Secondary | ICD-10-CM | POA: Diagnosis not present

## 2022-06-20 DIAGNOSIS — D62 Acute posthemorrhagic anemia: Secondary | ICD-10-CM | POA: Diagnosis not present

## 2022-06-20 DIAGNOSIS — K922 Gastrointestinal hemorrhage, unspecified: Secondary | ICD-10-CM | POA: Diagnosis not present

## 2022-06-20 LAB — BPAM RBC
Blood Product Expiration Date: 202307132359
Blood Product Expiration Date: 202307152359
Blood Product Expiration Date: 202307182359
ISSUE DATE / TIME: 202306211459
ISSUE DATE / TIME: 202306231619
ISSUE DATE / TIME: 202306241805
Unit Type and Rh: 5100
Unit Type and Rh: 5100
Unit Type and Rh: 5100

## 2022-06-20 LAB — GLUCOSE, CAPILLARY
Glucose-Capillary: 182 mg/dL — ABNORMAL HIGH (ref 70–99)
Glucose-Capillary: 184 mg/dL — ABNORMAL HIGH (ref 70–99)
Glucose-Capillary: 191 mg/dL — ABNORMAL HIGH (ref 70–99)
Glucose-Capillary: 195 mg/dL — ABNORMAL HIGH (ref 70–99)
Glucose-Capillary: 203 mg/dL — ABNORMAL HIGH (ref 70–99)
Glucose-Capillary: 209 mg/dL — ABNORMAL HIGH (ref 70–99)

## 2022-06-20 LAB — RENAL FUNCTION PANEL
Albumin: 2.1 g/dL — ABNORMAL LOW (ref 3.5–5.0)
Albumin: 2.1 g/dL — ABNORMAL LOW (ref 3.5–5.0)
Anion gap: 7 (ref 5–15)
Anion gap: 6 (ref 5–15)
BUN: 64 mg/dL — ABNORMAL HIGH (ref 8–23)
BUN: 58 mg/dL — ABNORMAL HIGH (ref 8–23)
CO2: 24 mmol/L (ref 22–32)
CO2: 25 mmol/L (ref 22–32)
Calcium: 8.6 mg/dL — ABNORMAL LOW (ref 8.9–10.3)
Calcium: 8.9 mg/dL (ref 8.9–10.3)
Chloride: 109 mmol/L (ref 98–111)
Chloride: 105 mmol/L (ref 98–111)
Creatinine, Ser: 4.41 mg/dL — ABNORMAL HIGH (ref 0.61–1.24)
Creatinine, Ser: 3.9 mg/dL — ABNORMAL HIGH (ref 0.61–1.24)
GFR, Estimated: 13 mL/min — ABNORMAL LOW (ref >60)
GFR, Estimated: 15 mL/min — ABNORMAL LOW (ref >60)
Glucose, Bld: 219 mg/dL — ABNORMAL HIGH (ref 70–99)
Glucose, Bld: 201 mg/dL — ABNORMAL HIGH (ref 70–99)
Phosphorus: 3.6 mg/dL (ref 2.5–4.6)
Phosphorus: 2.7 mg/dL (ref 2.5–4.6)
Potassium: 4.1 mmol/L (ref 3.5–5.1)
Potassium: 3.9 mmol/L (ref 3.5–5.1)
Sodium: 140 mmol/L (ref 135–145)
Sodium: 136 mmol/L (ref 135–145)

## 2022-06-20 LAB — CBC WITH DIFFERENTIAL/PLATELET
Abs Immature Granulocytes: 1.03 10*3/uL — ABNORMAL HIGH (ref 0.00–0.07)
Basophils Absolute: 0.1 10*3/uL (ref 0.0–0.1)
Basophils Relative: 0 %
Eosinophils Absolute: 0.1 10*3/uL (ref 0.0–0.5)
Eosinophils Relative: 0 %
HCT: 27 % — ABNORMAL LOW (ref 39.0–52.0)
Hemoglobin: 8.7 g/dL — ABNORMAL LOW (ref 13.0–17.0)
Immature Granulocytes: 4 %
Lymphocytes Relative: 3 %
Lymphs Abs: 0.8 10*3/uL (ref 0.7–4.0)
MCH: 27.4 pg (ref 26.0–34.0)
MCHC: 32.2 g/dL (ref 30.0–36.0)
MCV: 85.2 fL (ref 80.0–100.0)
Monocytes Absolute: 1.5 10*3/uL — ABNORMAL HIGH (ref 0.1–1.0)
Monocytes Relative: 6 %
Neutro Abs: 24.5 10*3/uL — ABNORMAL HIGH (ref 1.7–7.7)
Neutrophils Relative %: 87 %
Platelets: 189 10*3/uL (ref 150–400)
RBC: 3.17 MIL/uL — ABNORMAL LOW (ref 4.22–5.81)
RDW: 16.5 % — ABNORMAL HIGH (ref 11.5–15.5)
Smear Review: NORMAL
WBC: 27.5 10*3/uL — ABNORMAL HIGH (ref 4.0–10.5)
nRBC: 0.1 % (ref 0.0–0.2)

## 2022-06-20 LAB — MAGNESIUM: Magnesium: 2.1 mg/dL (ref 1.7–2.4)

## 2022-06-20 LAB — TYPE AND SCREEN
ABO/RH(D): O POS
Antibody Screen: NEGATIVE
Unit division: 0
Unit division: 0
Unit division: 0

## 2022-06-20 LAB — RPR: RPR Ser Ql: NONREACTIVE

## 2022-06-20 MED ORDER — FLUCONAZOLE IN SODIUM CHLORIDE 400-0.9 MG/200ML-% IV SOLN
400.0000 mg | Freq: Once | INTRAVENOUS | Status: AC
  Administered 2022-06-20: 400 mg via INTRAVENOUS
  Filled 2022-06-20: qty 200

## 2022-06-20 MED ORDER — ORAL CARE MOUTH RINSE: 15.0000 mL | OROMUCOSAL | Status: DC | PRN

## 2022-06-20 MED ORDER — ORAL CARE MOUTH RINSE
15.0000 mL | OROMUCOSAL | Status: DC
  Administered 2022-06-20 – 2022-06-28 (×29): 15 mL via OROMUCOSAL

## 2022-06-20 MED ORDER — NEPRO/CARBSTEADY PO LIQD
1000.0000 mL | ORAL | Status: DC
  Administered 2022-06-21: 1000 mL

## 2022-06-20 MED ORDER — INSULIN GLARGINE-YFGN 100 UNIT/ML ~~LOC~~ SOLN
8.0000 [IU] | Freq: Every day | SUBCUTANEOUS | Status: DC
Start: 2022-06-20 — End: 2022-06-24
  Administered 2022-06-20 – 2022-06-23 (×4): 8 [IU] via SUBCUTANEOUS
  Filled 2022-06-20 (×5): qty 0.08

## 2022-06-20 MED ORDER — TRACE MINERALS CU-MN-SE-ZN 300-55-60-3000 MCG/ML IV SOLN
INTRAVENOUS | Status: AC
  Filled 2022-06-20: qty 993.6

## 2022-06-20 MED ORDER — INSULIN ASPART 100 UNIT/ML IJ SOLN
0.0000 [IU] | INTRAMUSCULAR | Status: AC
  Administered 2022-06-21: 4 [IU] via SUBCUTANEOUS
  Administered 2022-06-21: 7 [IU] via SUBCUTANEOUS
  Administered 2022-06-21: 4 [IU] via SUBCUTANEOUS
  Administered 2022-06-21: 3 [IU] via SUBCUTANEOUS
  Administered 2022-06-21 – 2022-06-22 (×8): 4 [IU] via SUBCUTANEOUS
  Administered 2022-06-22: 3 [IU] via SUBCUTANEOUS
  Administered 2022-06-23 – 2022-06-24 (×8): 4 [IU] via SUBCUTANEOUS
  Administered 2022-06-24: 7 [IU] via SUBCUTANEOUS
  Administered 2022-06-24: 4 [IU] via SUBCUTANEOUS
  Filled 2022-06-20 (×23): qty 1

## 2022-06-20 NOTE — Progress Notes (Signed)
[facility]  ROUNDING NOTE   Subjective:    Patient was seen in ICU Patient is more awake and alert than yesterday Patient is responding more to questions than yesterday Patient offers no new specific physical complaints Patient wife was present in the room. Answered patient wife queries to the best of my ability   Objective:  Vital signs in last 24 hours:  Temp:  [98.1 F (36.7 C)-100.7 F (38.2 C)] 98.1 F (36.7 C) (06/25 0900) Pulse Rate:  [66-103] 100 (06/25 0800) Resp:  [11-27] 20 (06/25 0900) BP: (82-152)/(53-91) 112/64 (06/25 0900) SpO2:  [88 %-98 %] 95 % (06/25 0800) Weight:  [98.9 kg] 98.9 kg (06/25 0404)  Weight change: -4.4 kg Filed Weights   06/18/22 1108 06/19/22 0411 06/20/22 0404  Weight: 103.3 kg 100 kg 98.9 kg    Intake/Output: I/O last 3 completed shifts: In: 5870.2 [I.V.:3633.3; Blood:485; Other:90; NG/GT:643.7; IV Piggyback:1018.2] Out: 7318 [Urine:51; Emesis/NG output:400; ZOXWR:6045; Stool:150]   Intake/Output this shift:  Total I/O In: 219.9 [I.V.:179.9; NG/GT:40] Out: 220 [Other:220]  Physical Exam: General: NAD  Head: Normocephalic, atraumatic. Dry oral mucosal membranes  Eyes: Anicteric  Lungs:  Basilar rhonchi, normal effort,  O2  Heart: Regular rate and rhythm  Abdomen:  Firm, distended, mild diffuse tenderness, BS absent  Extremities: Trace peripheral edema.  Neurologic: Alert, simple responses  Skin: Warm, dry  Access: Femoral  dialysis catheter 06/19/2022.      Basic Metabolic Panel: Recent Labs  Lab 06/16/22 0628 06/17/22 0331 06/18/22 0407 06/18/22 0520 06/18/22 1943 06/19/22 0406 06/19/22 1433 06/20/22 0352  NA 142   < >  --  140 139 135 136 140  K 4.9   < >  --  4.0 4.4 4.1 4.0 4.1  CL 92*   < >  --  102 103 103 104 109  CO2 31   < >  --  27 24 23 24 24   GLUCOSE 172*   < >  --  185* 212* 212* 198* 219*  BUN 103*   < >  --  74* 77* 73* 69* 64*  CREATININE 9.18*   < >  --  5.68* 6.32* 5.71* 5.29*  4.41*  CALCIUM 5.7*   < >  --  7.7* 7.6* 7.7* 8.0* 8.6*  MG 2.6*  --  2.5* 2.2  --  2.1  --  2.1  PHOS  --    < >  --  5.9* 6.6* 5.9* 5.0* 3.6   < > = values in this interval not displayed.    Liver Function Tests: Recent Labs  Lab 06/14/22 0423 06/15/22 0421 06/16/22 4098 06/17/22 0331 06/17/22 1015 06/17/22 1530 06/18/22 1943 06/19/22 0406 06/19/22 1433 06/19/22 1539 06/20/22 0352  AST 75* 72* 106*  --  86*  --   --   --   --  41  --   ALT 74* 64* 71*  --  58*  --   --   --   --  32  --   ALKPHOS 61 71 75  --  77  --   --   --   --  89  --   BILITOT 0.8 1.1 0.7  --  1.1  --   --   --   --  0.6  --   PROT 5.3* 6.0* 6.3*  --  6.0*  --   --   --   --  5.3*  --   ALBUMIN 2.7* 2.6* 2.4*   < >  2.4*   < > 2.0* 2.3* 2.0* 2.0* 2.1*   < > = values in this interval not displayed.   No results for input(s): "LIPASE", "AMYLASE" in the last 168 hours. Recent Labs  Lab 06/19/22 1500  AMMONIA 20     CBC: Recent Labs  Lab 06/16/22 0628 06/16/22 1155 06/17/22 0331 06/18/22 0407 06/19/22 0216 06/19/22 1433 06/20/22 0352  WBC 27.7*  --  25.0* 25.7* 24.5*  --  27.5*  NEUTROABS 24.5*  --  22.0* 24.2* 21.7*  --  24.5*  HGB 9.1*   < > 8.6* 8.0* 8.5* 7.7* 8.7*  HCT 26.2*  --  25.5* 23.6* 26.4* 23.7* 27.0*  MCV 77.7*  --  80.2 83.4 83.3  --  85.2  PLT 246  --  265 253 237  --  189   < > = values in this interval not displayed.    Cardiac Enzymes: No results for input(s): "CKTOTAL", "CKMB", "CKMBINDEX", "TROPONINI" in the last 168 hours.   BNP: Invalid input(s): "POCBNP"  CBG: Recent Labs  Lab 06/19/22 1605 06/19/22 1934 06/20/22 0007 06/20/22 0408 06/20/22 0744  GLUCAP 185* 191* 203* 195* 182*    Microbiology: Results for orders placed or performed during the hospital encounter of 06/08/22  Blood culture (routine x 2)     Status: None   Collection Time: 06/08/22  1:33 PM   Specimen: BLOOD  Result Value Ref Range Status   Specimen Description BLOOD LEFT HAND   Final   Special Requests   Final    BOTTLES DRAWN AEROBIC AND ANAEROBIC Blood Culture results may not be optimal due to an inadequate volume of blood received in culture bottles   Culture   Final    NO GROWTH 5 DAYS Performed at [facility], [address] [place], [place] [address]    Report Status 06/13/2022 FINAL  Final  Blood culture (routine x 2)     Status: None   Collection Time: 06/08/22  2:15 PM   Specimen: BLOOD  Result Value Ref Range Status   Specimen Description BLOOD BLOOD LEFT WRIST  Final   Special Requests   Final    BOTTLES DRAWN AEROBIC AND ANAEROBIC Blood Culture results may not be optimal due to an inadequate volume of blood received in culture bottles   Culture   Final    NO GROWTH 5 DAYS Performed at [facility], [address], [place], [place] [address]    Report Status 06/13/2022 FINAL  Final  MRSA Next Gen by PCR, Nasal     Status: None   Collection Time: 06/10/22  4:14 PM   Specimen: Nasal Mucosa; Nasal Swab  Result Value Ref Range Status   MRSA by PCR Next Gen NOT DETECTED NOT DETECTED Final    Comment: (NOTE) The GeneXpert MRSA Assay (FDA approved for NASAL specimens only), is one component of a comprehensive MRSA colonization surveillance program. It is not intended to diagnose MRSA infection nor to guide or monitor treatment for MRSA infections. Test performance is not FDA approved in patients less than 61 years old. Performed at [facility], [address] [place], [place] [address]   SARS Coronavirus 2 by RT PCR (hospital order, performed in [facility] hospital lab) *cepheid single result test* Anterior Nasal Swab     Status: None   Collection Time: 06/10/22  5:17 PM   Specimen: Anterior Nasal Swab  Result Value Ref Range Status   SARS Coronavirus 2 by RT PCR NEGATIVE  NEGATIVE Final    Comment: (NOTE) SARS-CoV-2 target nucleic acids are NOT DETECTED.  The SARS-CoV-2 RNA is generally detectable in upper and  lower respiratory specimens during the acute phase of infection. The lowest concentration of SARS-CoV-2 viral copies this assay can detect is 250 copies / mL. A negative result does not preclude SARS-CoV-2 infection and should not be used as the sole basis for treatment or other patient management decisions.  A negative result may occur with improper specimen collection / handling, submission of specimen other than nasopharyngeal swab, presence of viral mutation(s) within the areas targeted by this assay, and inadequate number of viral copies (<250 copies / mL). A negative result must be combined with clinical observations, patient history, and epidemiological information.  Fact Sheet for Patients:   RoadLapTop.co.za  Fact Sheet for Healthcare Providers: http://kim-miller.com/  This test is not yet approved or  cleared by the [place] FDA and has been authorized for detection and/or diagnosis of SARS-CoV-2 by FDA under an Emergency Use Authorization (EUA).  This EUA will remain in effect (meaning this test can be used) for the duration of the COVID-19 declaration under Section 564(b)(1) of the Act, 21 U.S.C. section 360bbb-3(b)(1), unless the authorization is terminated or revoked sooner.  Performed at [facility], [address] [place], [place] [address]   Culture, blood (Routine X 2) w Reflex to ID Panel     Status: None (Preliminary result)   Collection Time: 06/18/22 10:03 AM   Specimen: BLOOD  Result Value Ref Range Status   Specimen Description BLOOD RIGHT HAND  Final   Special Requests   Final    BOTTLES DRAWN AEROBIC ONLY Blood Culture adequate volume   Culture   Final    NO GROWTH 2 DAYS Performed at [facility], [address], [place], [place] [address]    Report Status PENDING  Incomplete  Culture, blood (Routine X 2) w Reflex to ID Panel     Status: None (Preliminary result)   Collection  Time: 06/18/22 10:20 AM   Specimen: BLOOD  Result Value Ref Range Status   Specimen Description BLOOD LEFT [facility]  Final   Special Requests   Final    BOTTLES DRAWN AEROBIC AND ANAEROBIC Blood Culture adequate volume   Culture   Final    NO GROWTH 2 DAYS Performed at [facility], [address], [place], [place] [address]    Report Status PENDING  Incomplete    Coagulation Studies: No results for input(s): "LABPROT", "INR" in the last 72 hours.   Urinalysis: No results for input(s): "COLORURINE", "LABSPEC", "PHURINE", "GLUCOSEU", "HGBUR", "BILIRUBINUR", "KETONESUR", "PROTEINUR", "UROBILINOGEN", "NITRITE", "LEUKOCYTESUR" in the last 72 hours.  Invalid input(s): "APPERANCEUR"     Imaging: CT ABDOMEN PELVIS WO CONTRAST  Result Date: 06/18/2022 CLINICAL DATA:  Acute severe pancreatitis EXAM: CT ABDOMEN AND PELVIS WITHOUT CONTRAST TECHNIQUE: Multidetector CT imaging of the abdomen and pelvis was performed following the standard protocol without IV contrast. RADIATION DOSE REDUCTION: This exam was performed according to the departmental dose-optimization program which includes automated exposure control, adjustment of the mA and/or kV according to patient size and/or use of iterative reconstruction technique. COMPARISON:  None Available. FINDINGS: Lower chest: Bibasilar effusions and passive atelectasis not changed. No pericardial fluid. Hepatobiliary: Segmental hypodensities in the medial RIGHT hepatic lobe remain concerning for hepatic abscess. Additional low-density hepatic cysts again noted. No change. Gallbladder unchanged. Pancreas: There is interval increase in edema inflammation of the pancreas poor definition of the  border. There is a lesion of low attenuation within the mid body pancreas measuring 4 cm x 2.4 cm (35/2 which is poorly defined but appears new from prior. No clear organized fluid collections. There is interval increase in the omental mesenteric thickening associated  with acute pancreatitis. Suggestion of multiple small nodules within the omentum and retroperitoneum associated with the fluid collections. For example in Morrison's pouch multiple small nodules. Representative hazy thickening along the anterior LEFT and RIGHT pararenal fascia. Injection of the retroperitoneal fat within the pararenal space and deep the pararenal space on the RIGHT for example (image 50/2). Increased thickening along the LEFT pararenal space (image 39/2). No organized fluid collections. Spleen: Normal spleen Adrenals/urinary tract: Adrenal glands and kidneys normal. Nonobstructing calculus are RIGHT kidney. Foley catheter in bladder. Stomach/Bowel: NG tube in stomach. Duodenum small bowel grossly normal. Appendix normal. Multiple diverticula of the descending colon and sigmoid colon without acute inflammation. Fluid stool in the rectum. Rectal tube in place Vascular/Lymphatic: Abdominal aorta is normal caliber. No periportal or retroperitoneal adenopathy. No pelvic adenopathy. Reproductive: Unremarkable Other: No free fluid. Musculoskeletal: No aggressive osseous lesion. IMPRESSION: 1. Increased nodularity associated with the omental inflammation and inflammatory thickening along the retroperitoneal perirenal space. This nodularity suggest progression of pancreatic inflammation versus potentially pancreatic adenocarcinoma metastasis. Recommend correlation with appropriate tumor markers. 2. Pancreas is less well-defined. There is a low-density region within the mid body the pancreas. No clear organized fluid collections are present. 3. Cluster of low-attenuation lesion in the RIGHT hepatic lobe could represent hepatic abscess. Cannot exclude malignancy. Electronically Signed   By: Genevive Bi M.D.   On: 06/18/2022 13:56   CT HEAD WO CONTRAST ( )  Result Date: 06/18/2022 CLINICAL DATA:  Provided history: Delirium. EXAM: CT HEAD WITHOUT CONTRAST TECHNIQUE: Contiguous axial images were  obtained from the base of the skull through the vertex without intravenous contrast. RADIATION DOSE REDUCTION: This exam was performed according to the departmental dose-optimization program which includes automated exposure control, adjustment of the mA and/or kV according to patient size and/or use of iterative reconstruction technique. COMPARISON:  No pertinent prior exams available for comparison. FINDINGS: Brain: Mild cerebral atrophy. There is no acute intracranial hemorrhage. No demarcated cortical infarct. No extra-axial fluid collection. No evidence of an intracranial mass. No midline shift. Vascular: No hyperdense vessel. Atherosclerotic calcifications. Skull: No fracture or aggressive osseous lesion. Sinuses/Orbits: No mass or acute finding within the imaged orbits. No significant paranasal sinus disease at the imaged levels. IMPRESSION: No evidence of acute intracranial abnormality. Mild cerebral atrophy. Electronically Signed   By: Jackey Loge D.O.   On: 06/18/2022 13:37     Medications:    sodium chloride Stopped (06/19/22 2153)   feeding supplement (NEPRO CARB STEADY) 20 mL/hr at 06/20/22 0900   fluconazole (DIFLUCAN) IV Stopped (06/19/22 2036)   heparin 10,000 units/ 20 mL infusion syringe 250 Units/hr (06/20/22 0900)   meropenem (MERREM) IV Stopped (06/20/22 0534)   prismasol BGK 2/2.5 dialysis solution 1,500 mL/hr at 06/20/22 0630   prismasol BGK 2/2.5 replacement solution 300 mL/hr at 06/19/22 2006   prismasol BGK 2/2.5 replacement solution 300 mL/hr at 06/19/22 2008   TPN ADULT (ION) 90 mL/hr at 06/20/22 0900   TPN ADULT (ION)      sodium chloride   Intravenous Once   Chlorhexidine Gluconate Cloth  6 each Topical Q0600   free water  100 mL Per Tube Q8H   insulin aspart  0-15 Units Subcutaneous Q4H   multivitamin  1  tablet Per Tube QHS   pantoprazole  40 mg Intravenous Q12H   sodium chloride flush  10-40 mL Intracatheter Q12H   sodium chloride, acetaminophen **OR**  acetaminophen, alteplase, guaiFENesin-dextromethorphan, haloperidol lactate, heparin, HYDROmorphone (DILAUDID) injection, hydrOXYzine, ipratropium-albuterol, lidocaine (PF), lidocaine-prilocaine, lip balm, mouth rinse, oxyCODONE, pentafluoroprop-tetrafluoroeth, sodium chloride, sodium chloride flush  Assessment/ Plan:  Xavier Esparza is a 76 y.o.  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  Normal kidney function prior to admission.  IV contrast exposure on 06/08/2022 and 06/09/2022.  Renal ultrasound negative for obstruction.  Urinalysis shows hematuria and mild proteinuria.   ?  Gross hematuria related to Foley catheter.  Patient has history of prostate cancer.   Received first dialysis treatment on June 16.   Patient remains on CRRT, currently net 0, ordered UF 0 ml/hr. Tolerating well.   Patient had multiple issues with CRRT yesterday as patient was having clotting of his circuit/dialyzer .Patient was earlier not on heparin I then had extensive discussion with the ICU team and the primary team.   We were concerned regarding patient's chances of bleeding as patient has gastric ulcers. Patient gastric ulcer currently not bleeding and in the past 24 hours we had to change to dialyzer 4 times which worsens the anemia so after discussion with the ICU team and the primary team I decided to add heparin. Patient thereafter had catheter malfunction secondary to patient being confused and moving.   ICU  team was at bedside.Will continue CRRT for now.    Patient has required CRRT starting June 16 Will follow closely and decide in the morning if we can hold his CRRT to follow-up on his urine output/need for regular intermittent hemodialysis  Lab Results  Component Value Date   CREATININE 4.41 (H) 06/20/2022   CREATININE 5.29 (H) 06/19/2022   CREATININE 5.71 (H) 06/19/2022    Intake/Output Summary (Last 24 hours) at 06/20/2022 0941 Last data filed at 06/20/2022 0900 Gross per 24 hour  Intake 4249.97 ml  Output 4311 ml  Net -61.03 ml   Acute pancreatitis, severe necrotizing.  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   Remains NPO.  Supportive care with antiemetics and pain control.  Receiving antibiotic  And antifungal therapy managed by primary team.  Receiving TPN.    Diabetes mellitus type 2 without complication.Home regimen includes metformin.  Hemoglobin A1c 6.2 on 01/08/2022.  -Agree with holding metformin. - SSI managed by primary team  Acute ileus, receiving IV narcotics for severe acute pancreatitis. Tube feeds stopped and NGT placed to LWIS. Rectal tube placed  EGD was done yesterday it showed 3 gastric ulcers, no active bleeding noted.  Continue current treatments per GI.   I spent more than 45 minutes in patient's critical care as patient requires CRRT/was having issues with the circuit/has complicated medical history/discussed the case at length with ICU team/primary team   LOS: 12 Mckyla Deckman s [facility] 6/25/20239:41 AM

## 2022-06-20 NOTE — Progress Notes (Signed)
Progress Note   Patient: Xavier Esparza BJY:[id number] DOB: 11/01/1946 DOA: 06/08/2022     12 DOS: the patient was seen and examined on 06/20/2022   Assessment and Plan: * Acute necrotizing pancreatitis MRCP revealed severe acute necrotizing pancreatitis with portal vein thrombosis.  With low-grade fever on 623 and I repeated blood cultures which are negative for 2 days.  Repeat CT scan does not show any abscess on the pancreas but does show fluid collection still in the liver which could be an abscess.  Empirically on meropenem and Diflucan tumor markers ordered including CEA, CA 19-9 and alpha-fetoprotein.  Currently on TPN.  Tube feeding started yesterday and increasing to 30 cc/h today.  Hopefully tomorrow we can increase tube feedings further and taper TPN.  Patient is critically ill.  Acute blood loss anemia Last hemoglobin 8.7.  Patient received 3 units of packed red blood cells during the hospital course.  Upper GI bleed Secondary to duodenal ulcers.  No active bleeding.  Monitor closely with heparin with dialysis.  On Protonix IV twice daily  Severe sepsis (HCC) Present on admission secondary to severe necrotizing pancreatitis.  Empirically on meropenem and Diflucan.  Repeat blood cultures negative for 2 days.  Acute metabolic encephalopathy Mental status better today than yesterday.  May be better dialysis with the new catheter.  May be secondary to removing medications that can cause altered mental status.  May be secondary to meropenem and Diflucan helping.  AKI (acute kidney injury) [facility]) Patient started on CRRT on 06/17/2022.  Continue CRRT.  Critical care team took out the catheter of the neck and put a catheter in the right groin for better CRRT.  Portal vein thrombosis Unable to do anticoagulation with drop in hemoglobin and upper GI bleed.  Syncope Secondary to hypotension   Nephrolithiasis Nonobstructing right renal stone.  No acute issues suspected  History of  prostate cancer No acute issues  BPH (benign prostatic hyperplasia) No acute issues  Diabetes mellitus without complication (HCC) Sugars elevated while on TPN.  Add low-dose Semglee insulin at night.  Can probably get rid of Semglee insulin once TPN discontinued.        Subjective: Patient mental status is better today than yesterday.  Still requiring some as needed pain medications but stated when I saw him earlier that he was not in pain.  Admitted with severe necrotizing pancreatitis.  Physical Exam: Vitals:   06/20/22 1100 06/20/22 1200 06/20/22 1300 06/20/22 1400  BP: 114/75 130/67 131/72 126/78  Pulse: 91 91 94 94  Resp: 16 17 18 16   Temp:   99.2 F (37.3 C)   TempSrc:   Oral   SpO2: 92% 94% 93% 91%  Weight:      Height:       Physical Exam HENT:     Head: Normocephalic.     Mouth/Throat:     Pharynx: No oropharyngeal exudate.  Eyes:     General: Lids are normal.     Conjunctiva/sclera: Conjunctivae normal.  Cardiovascular:     Rate and Rhythm: Normal rate and regular rhythm.     Heart sounds: Normal heart sounds, S1 normal and S2 normal.  Pulmonary:     Breath sounds: Examination of the right-lower field reveals decreased breath sounds. Examination of the left-lower field reveals decreased breath sounds. Decreased breath sounds present. No wheezing, rhonchi or rales.  Abdominal:     General: There is distension.     Palpations: Abdomen is soft.  Tenderness: There is generalized abdominal tenderness.  Musculoskeletal:     Right lower leg: Swelling present.     Left lower leg: Swelling present.     Right ankle: Swelling present.     Left ankle: Swelling present.  Skin:    General: Skin is warm.     Findings: No rash.  Neurological:     Mental Status: He is alert.     Comments: Patient able to communicate better today than yesterday.     Data Reviewed: White blood cell count 27.5, hemoglobin 8.7, creatinine 4.41, albumin 2.1, calcium 8.6, last ABG  with a pH of 7.3, PCO2 of 48 and PO2 of 109  Family Communication: Spoke with patient's wife at the bedside  Disposition: Status is: Inpatient Remains inpatient appropriate because: Still critically ill requiring support of multiple organs including continuous dialysis. Planned Discharge Destination: To be determined based on clinical course  Case discussed with critical care specialist and nursing staff. Author: Alford Highland, MD 06/20/2022 2:27 PM  For on call review [email or web address].

## 2022-06-21 ENCOUNTER — Inpatient Hospital Stay: Payer: Medicare Other

## 2022-06-21 ENCOUNTER — Encounter: Payer: Self-pay | Admitting: Gastroenterology

## 2022-06-21 DIAGNOSIS — D62 Acute posthemorrhagic anemia: Secondary | ICD-10-CM | POA: Diagnosis not present

## 2022-06-21 DIAGNOSIS — K750 Abscess of liver: Secondary | ICD-10-CM | POA: Diagnosis not present

## 2022-06-21 DIAGNOSIS — I81 Portal vein thrombosis: Secondary | ICD-10-CM | POA: Diagnosis not present

## 2022-06-21 DIAGNOSIS — K8591 Acute pancreatitis with uninfected necrosis, unspecified: Secondary | ICD-10-CM | POA: Diagnosis not present

## 2022-06-21 DIAGNOSIS — K859 Acute pancreatitis without necrosis or infection, unspecified: Secondary | ICD-10-CM | POA: Diagnosis not present

## 2022-06-21 DIAGNOSIS — R55 Syncope and collapse: Secondary | ICD-10-CM | POA: Diagnosis not present

## 2022-06-21 DIAGNOSIS — K922 Gastrointestinal hemorrhage, unspecified: Secondary | ICD-10-CM | POA: Diagnosis not present

## 2022-06-21 LAB — PHOSPHORUS: Phosphorus: 2.6 mg/dL (ref 2.5–4.6)

## 2022-06-21 LAB — GLUCOSE, CAPILLARY
Glucose-Capillary: 134 mg/dL — ABNORMAL HIGH (ref 70–99)
Glucose-Capillary: 177 mg/dL — ABNORMAL HIGH (ref 70–99)
Glucose-Capillary: 182 mg/dL — ABNORMAL HIGH (ref 70–99)
Glucose-Capillary: 187 mg/dL — ABNORMAL HIGH (ref 70–99)
Glucose-Capillary: 191 mg/dL — ABNORMAL HIGH (ref 70–99)
Glucose-Capillary: 199 mg/dL — ABNORMAL HIGH (ref 70–99)
Glucose-Capillary: 215 mg/dL — ABNORMAL HIGH (ref 70–99)

## 2022-06-21 LAB — COMPREHENSIVE METABOLIC PANEL
ALT: 29 U/L (ref 0–44)
AST: 45 U/L — ABNORMAL HIGH (ref 15–41)
Albumin: 2.1 g/dL — ABNORMAL LOW (ref 3.5–5.0)
Alkaline Phosphatase: 112 U/L (ref 38–126)
Anion gap: 9 (ref 5–15)
BUN: 57 mg/dL — ABNORMAL HIGH (ref 8–23)
CO2: 24 mmol/L (ref 22–32)
Calcium: 9 mg/dL (ref 8.9–10.3)
Chloride: 103 mmol/L (ref 98–111)
Creatinine, Ser: 2.84 mg/dL — ABNORMAL HIGH (ref 0.61–1.24)
GFR, Estimated: 22 mL/min — ABNORMAL LOW (ref >60)
Glucose, Bld: 203 mg/dL — ABNORMAL HIGH (ref 70–99)
Potassium: 3.5 mmol/L (ref 3.5–5.1)
Sodium: 136 mmol/L (ref 135–145)
Total Bilirubin: 0.4 mg/dL (ref 0.3–1.2)
Total Protein: 5.7 g/dL — ABNORMAL LOW (ref 6.5–8.1)

## 2022-06-21 LAB — RENAL FUNCTION PANEL
Albumin: 2 g/dL — ABNORMAL LOW (ref 3.5–5.0)
Albumin: 2.2 g/dL — ABNORMAL LOW (ref 3.5–5.0)
Anion gap: 7 (ref 5–15)
Anion gap: 8 (ref 5–15)
BUN: 56 mg/dL — ABNORMAL HIGH (ref 8–23)
BUN: 55 mg/dL — ABNORMAL HIGH (ref 8–23)
CO2: 26 mmol/L (ref 22–32)
CO2: 25 mmol/L (ref 22–32)
Calcium: 9 mg/dL (ref 8.9–10.3)
Calcium: 8.2 mg/dL — ABNORMAL LOW (ref 8.9–10.3)
Chloride: 103 mmol/L (ref 98–111)
Chloride: 104 mmol/L (ref 98–111)
Creatinine, Ser: 3.59 mg/dL — ABNORMAL HIGH (ref 0.61–1.24)
Creatinine, Ser: 3.34 mg/dL — ABNORMAL HIGH (ref 0.61–1.24)
GFR, Estimated: 17 mL/min — ABNORMAL LOW (ref >60)
GFR, Estimated: 18 mL/min — ABNORMAL LOW (ref >60)
Glucose, Bld: 204 mg/dL — ABNORMAL HIGH (ref 70–99)
Glucose, Bld: 187 mg/dL — ABNORMAL HIGH (ref 70–99)
Phosphorus: 2.6 mg/dL (ref 2.5–4.6)
Phosphorus: 2 mg/dL — ABNORMAL LOW (ref 2.5–4.6)
Potassium: 3.5 mmol/L (ref 3.5–5.1)
Potassium: 4 mmol/L (ref 3.5–5.1)
Sodium: 136 mmol/L (ref 135–145)
Sodium: 137 mmol/L (ref 135–145)

## 2022-06-21 LAB — CBC WITH DIFFERENTIAL/PLATELET
Abs Immature Granulocytes: 1.32 10*3/uL — ABNORMAL HIGH (ref 0.00–0.07)
Basophils Absolute: 0.1 10*3/uL (ref 0.0–0.1)
Basophils Relative: 0 %
Eosinophils Absolute: 0.1 10*3/uL (ref 0.0–0.5)
Eosinophils Relative: 0 %
HCT: 24 % — ABNORMAL LOW (ref 39.0–52.0)
Hemoglobin: 8 g/dL — ABNORMAL LOW (ref 13.0–17.0)
Immature Granulocytes: 5 %
Lymphocytes Relative: 4 %
Lymphs Abs: 1 10*3/uL (ref 0.7–4.0)
MCH: 28.2 pg (ref 26.0–34.0)
MCHC: 33.3 g/dL (ref 30.0–36.0)
MCV: 84.5 fL (ref 80.0–100.0)
Monocytes Absolute: 2.2 10*3/uL — ABNORMAL HIGH (ref 0.1–1.0)
Monocytes Relative: 8 %
Neutro Abs: 21.4 10*3/uL — ABNORMAL HIGH (ref 1.7–7.7)
Neutrophils Relative %: 83 %
Platelets: 209 10*3/uL (ref 150–400)
RBC: 2.84 MIL/uL — ABNORMAL LOW (ref 4.22–5.81)
RDW: 16.8 % — ABNORMAL HIGH (ref 11.5–15.5)
Smear Review: NORMAL
WBC: 26 10*3/uL — ABNORMAL HIGH (ref 4.0–10.5)
nRBC: 0.1 % (ref 0.0–0.2)

## 2022-06-21 LAB — MISC LABCORP TEST (SEND OUT): Labcorp test code: 2261

## 2022-06-21 LAB — AFP TUMOR MARKER: AFP, Serum, Tumor Marker: <1.8 ng/mL (ref 0.0–8.4)

## 2022-06-21 LAB — MAGNESIUM: Magnesium: 2.1 mg/dL (ref 1.7–2.4)

## 2022-06-21 LAB — TRIGLYCERIDES: Triglycerides: 234 mg/dL — ABNORMAL HIGH (ref <150)

## 2022-06-21 LAB — CEA: CEA: 0.8 ng/mL (ref 0.0–4.7)

## 2022-06-21 MED ORDER — QUETIAPINE FUMARATE 25 MG PO TABS
12.5000 mg | ORAL_TABLET | Freq: Every evening | ORAL | Status: DC | PRN
  Administered 2022-06-21 – 2022-06-27 (×3): 12.5 mg via ORAL
  Filled 2022-06-21 (×3): qty 1

## 2022-06-21 MED ORDER — PRISMASOL BGK 4/2.5 32-4-2.5 MEQ/L EC SOLN: Status: DC

## 2022-06-21 MED ORDER — PRISMASOL BGK 4/2.5 32-4-2.5 MEQ/L REPLACEMENT SOLN
Status: DC
  Filled 2022-06-21: qty 5000

## 2022-06-21 MED ORDER — TRACE MINERALS CU-MN-SE-ZN 300-55-60-3000 MCG/ML IV SOLN
INTRAVENOUS | Status: AC
  Filled 2022-06-21: qty 993.6

## 2022-06-21 MED ORDER — PRISMASOL BGK 4/2.5 32-4-2.5 MEQ/L REPLACEMENT SOLN
Status: DC
  Filled 2022-06-21 (×6): qty 5000

## 2022-06-21 NOTE — Progress Notes (Signed)
Nutrition Follow-up  DOCUMENTATION CODES:   Obesity unspecified  INTERVENTION:   -TPN management per pharmacy -RD will follow for ability to resume enteral nutrition  NUTRITION DIAGNOSIS:   Inadequate oral intake related to acute illness as evidenced by NPO status.  Ongoing  GOAL:   Patient will meet greater than or equal to 90% of their needs  Met with TPN  MONITOR:   Diet advancement, Labs, Weight trends, Skin, I & O's  REASON FOR ASSESSMENT:   Consult Enteral/tube feeding initiation and management  ASSESSMENT:   76 y/o male with h/o DM, BPH, prostate cancer, hiatal hernia, kidney stones and HTN who is admitted with severe necrotizing pancreatitis, portal vein thrombosis, liver lesions, AKI and sepsis.  6/16- s/p fluoroscopy guided post pyloric NGT, HD initiated 6/19- rectal tube inserted 6/22- CRRT initiated, PICC placed, TPN initiated 6/25- TF initiated (Nepro @ 30 ml/hr)  Reviewed I/O's: +5.4 L since admission  UOP: 15 ml x 24 hours   Case discussed with MD, RN, and during ICU rounds. Pt is "holding steady", however, extremely restless. Foley and rectal tube have been d/c.   Pt with no UOP; nephrology to re-eval need for CRRT today.   Pt is very distended. His abdomen is tender and guard abdomen. TF now on hold for possible ileus.   Case discussed with pharmacist. Plan to continue TPN at goal rate- 90 ml/hr, which provides 2499 kcals and 149 grams protein, meeting 100% of estimated kcal and protein needs.   Medications reviewed and include diflucan.   Labs reviewed: CBGS: 182-215 (inpatient orders for glycemic control are 0-20 units insulin aspart every 4 hours and 8 units insulin glargine-yfgn daily)  Diet Order:   Diet Order             Diet - low sodium heart healthy                   EDUCATION NEEDS:   Education needs have been addressed  Skin:  Skin Assessment: Reviewed RN Assessment  Last BM:  06/17/22 (via rectal tube)  Height:    Ht Readings from Last 1 Encounters:  06/10/22 6' (1.829 m)    Weight:   Wt Readings from Last 1 Encounters:  06/21/22 97.1 kg    Ideal Body Weight:  80.9 kg  BMI:  Body mass index is 29.03 kg/m.  Estimated Nutritional Needs:   Kcal:  2130-8657  Protein:  130-155 grams  Fluid:  > 2 L    Levada Schilling, RD, LDN, CDCES Registered Dietitian II Certified Diabetes Care and Education Specialist Please refer to [facility] for RD and/or RD on-call/weekend/after hours pager

## 2022-06-21 NOTE — Progress Notes (Signed)
NAME:  Xavier Esparza, MRN:  [id number], DOB:  1946-02-26, LOS: 13 ADMISSION DATE:  06/08/2022, CONSULTATION DATE:  19 June 2022 REFERRING MD:  Alford Highland, MD CHIEF COMPLAINT:  Severe pancreatitis, AKI, on CRRT   History of Present Illness:  Patient is a 76 year old who presented to [facility] on 08 June 2022 with severe pancreatitis.  Initial presentation was following a syncopal episode at home.  Patient initially had some hypotension which responded to fluids and modest lactic acidosis which responded to fluid resuscitation.  He has not required pressors during his admission.  Initially noted to be mentating well and then subsequently developing toxic metabolic encephalopathy and needed transfer to the ICU/stepdown area.  He was evaluated by PCCM on 15 June when he was transferred to the stepdown area.  The patient still remained hemodynamically stable and without respiratory distress.  On the 16th June he required dialysis catheter placement due to worsening renal failure and need for hemodialysis.  On 19 June PCCM signed off as though the patient was acutely ill with pancreatitis, there were no critical care issues pending.  Patient was receiving intermittent hemodialysis at that time listed as a stepdown status.  Because of requirement of fluid boluses during hemodialysis the patient was switched to CRRT on the evening of 22 June.  This was an automatic change in status from stepdown to ICU.  The patient continues to remain hemodynamically stable and not requiring ventilatory support at present.  We are asked to reconsult on the patient to continue to assist with any potential critical care issues that may develop.  Pertinent  Medical History  Diabetes mellitus Dyslipidemia Prostate cancer Nephrolithiasis  Significant Hospital Events: Including procedures, antibiotic start and stop dates in addition to other pertinent events   ANTIBIOTICS 06/08/2022 Vancomycin >>06/08/2022 06/08/2022 Flagyl  >>06/09/2022 06/08/2022 Cefepime>>06/09/2022 06/09/2022 PipTazo>>06/17/2022 06/18/2022 Meropenem>> 06/19/2022 Fluconazole>>  EVENTS 06/10/2022 Transferred to SDU 06/11/2022 dialysis catheter placement, hemodialysis initiated 06/17/2022 switch to CRRT, patient changed to ICU status 06/18/2022 EGD done, shows gastric/duodenal ulcers, on PPI twice daily 06/19/2022 PCCM reconsulted, recommendations discussed with hospitalist team, new dialysis catheter placed 06/20/2022 marked improvement in mental status, new HD catheter yesterday, addition of fluconazole  Interim History / Subjective:  Remains critically ill On CRRT Minimal oxygen Not on pressors +TPN, +TF's  Objective   Blood pressure 120/67, pulse 93, temperature 99.2 F (37.3 C), temperature source Axillary, resp. rate (!) 26, height 6' (1.829 m), weight 97.1 kg, SpO2 94 %.        Intake/Output Summary (Last 24 hours) at 06/21/2022 0734 Last data filed at 06/21/2022 0700 Gross per 24 hour  Intake 3888.93 ml  Output 3889 ml  Net -0.07 ml    Filed Weights   06/19/22 0411 06/20/22 0404 06/21/22 0412  Weight: 100 kg 98.9 kg 97.1 kg    REVIEW OF SYSTEMS  PATIENT IS UNABLE TO PROVIDE COMPLETE REVIEW OF SYSTEMS DUE TO  CRITICAL ILLNESS and confusion    PHYSICAL EXAMINATION:  GENERAL:critically ill appearing,  EYES: Pupils equal, round, reactive to light.  No scleral icterus.  MOUTH: Moist mucosal membrane.  NECK: Supple.  PULMONARY: +rhonchi,  CARDIOVASCULAR: S1 and S2.  No murmurs  GASTROINTESTINAL: nontender,+distended. decreased bowel sounds.  MUSCULOSKELETAL: No swelling, clubbing, or edema.  NEUROLOGIC: confusion, awakens to voice follows simple commands [facility]    Resolved Hospital Problem list   N/A  Assessment & Plan:   76 yo AAM with severe NECROTIZING PANCREATITIS LEADING TO PROGRESSIVE RENAL FAILURE WITH PROLONGED ICU  LOS HIGH RISK FOR INTUBATION   GI Severe necrotizing  pancreatitis Apparent new nodularity on pancreas and omentum GI PROPHYLAXIS as indicated NUTRITIONAL STATUS DIET-->TF's as tolerated Constipation protocol as indicated Unclear etiology query due to Crestor (rosuvastatin)/LEVAQUIN Supportive care Continue TPN May start trickle feeds as able to have HOB at 30 degrees or better Continue supportive care    Multiple liver abscesses/micro abscesses Cluster of low-attenuation lesion RIGHT hepatic lobe infection versus malignancy Continue meropenem Fluconazole added 6/24   NEUROLOGY ACUTE TOXIC METABOLIC ENCEPHALOPATHY SLOWLY IMPROVING Limit sedatives as much as able Correct metabolic abnormalities as able Address infectious processes Reorientation techniques Maintain wake/sleep cycle   ACUTE KIDNEY INJURY/Renal Failure -continue Foley Catheter-assess need -Avoid nephrotoxic agents -Follow urine output, BMP -Ensure adequate renal perfusion, optimize oxygenation -Renal dose medications Remains on CRRT New dialysis catheter 6/24  Intake/Output Summary (Last 24 hours) at 06/21/2022 0739 Last data filed at 06/21/2022 0700 Gross per 24 hour  Intake 3888.93 ml  Output 3889 ml  Net -0.07 ml    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 Malignancy cannot be excluded Cannot be placed on anticoagulants currently due to gastric/duodenal ulcers  Gastric/duodenal ulcers PPI twice daily No full dose anticoagulation for now  ACUTE ANEMIA- TRANSFUSE AS NEEDED CONSIDER TRANSFUSION  IF HGB<7   ENDO - ICU hypoglycemic\Hyperglycemia protocol -check FSBS per protocol   ELECTROLYTES -follow labs as needed -replace as needed -pharmacy consultation and following   Best Practice (right click and "Reselect all SmartList Selections" daily)   Diet/type: tubefeeds and TPN - TF at trickle DVT prophylaxis: other Heparin through CRRT circuit GI prophylaxis: PPI Lines: Dialysis Catheter new/24 Foley:  Yes, and it is still  needed Code Status:  full code   Labs   CBC: Recent Labs  Lab 06/17/22 0331 06/18/22 0407 06/19/22 0216 06/19/22 1433 06/20/22 0352 06/21/22 0414  WBC 25.0* 25.7* 24.5*  --  27.5* 26.0*  NEUTROABS 22.0* 24.2* 21.7*  --  24.5* 21.4*  HGB 8.6* 8.0* 8.5* 7.7* 8.7* 8.0*  HCT 25.5* 23.6* 26.4* 23.7* 27.0* 24.0*  MCV 80.2 83.4 83.3  --  85.2 84.5  PLT 265 253 237  --  189 209     Basic Metabolic Panel: Recent Labs  Lab 06/18/22 0407 06/18/22 0520 06/18/22 1943 06/19/22 0406 06/19/22 1433 06/20/22 0352 06/20/22 1558 06/21/22 0414  NA  --  140   < > 135 136 140 136 136  136  K  --  4.0   < > 4.1 4.0 4.1 3.9 3.5  3.5  CL  --  102   < > 103 104 109 105 103  103  CO2  --  27   < > 23 24 24 25 26  24   GLUCOSE  --  185*   < > 212* 198* 219* 201* 204*  203*  BUN  --  74*   < > 73* 69* 64* 58* 56*  57*  CREATININE  --  5.68*   < > 5.71* 5.29* 4.41* 3.90* 3.59*  2.84*  CALCIUM  --  7.7*   < > 7.7* 8.0* 8.6* 8.9 9.0  9.0  MG 2.5* 2.2  --  2.1  --  2.1  --  2.1  PHOS  --  5.9*   < > 5.9* 5.0* 3.6 2.7 2.6  2.6   < > = values in this interval not displayed.    GFR: Estimated Creatinine Clearance: 20.8 mL/min (A) (by C-G formula based on SCr of 3.59 mg/dL (  H)). Recent Labs  Lab 06/18/22 0407 06/19/22 0216 06/19/22 1500 06/20/22 0352 06/21/22 0414  WBC 25.7* 24.5*  --  27.5* 26.0*  LATICACIDVEN  --   --  1.3  --   --      Liver Function Tests: Recent Labs  Lab 06/15/22 0421 06/16/22 0628 06/17/22 0331 06/17/22 1015 06/17/22 1530 06/19/22 1433 06/19/22 1539 06/20/22 0352 06/20/22 1558 06/21/22 0414  AST 72* 106*  --  86*  --   --  41  --   --  45*  ALT 64* 71*  --  58*  --   --  32  --   --  29  ALKPHOS 71 75  --  77  --   --  89  --   --  112  BILITOT 1.1 0.7  --  1.1  --   --  0.6  --   --  0.4  PROT 6.0* 6.3*  --  6.0*  --   --  5.3*  --   --  5.7*  ALBUMIN 2.6* 2.4*   < > 2.4*   < > 2.0* 2.0* 2.1* 2.1* 2.0*  2.1*   < > = values in this interval  not displayed.    No results for input(s): "LIPASE", "AMYLASE" in the last 168 hours. Recent Labs  Lab 06/19/22 1500  AMMONIA 20     ABG    Component Value Date/Time   PHART 7.3 (L) 06/19/2022 2130   PCO2ART 48 06/19/2022 2130   PO2ART 109 (H) 06/19/2022 2130   HCO3 23.6 06/19/2022 2130   ACIDBASEDEF 3.2 (H) 06/19/2022 2130   O2SAT 98.3 06/19/2022 2130    HbA1C: Hgb A1c MFr Bld  Date/Time Value Ref Range Status  01/08/2022 10:34 AM 6.2 4.6 - 6.5 % Final    Comment:    Glycemic Control Guidelines for People with Diabetes:Non Diabetic:  <6%Goal of Therapy: <7%Additional Action Suggested:  >8%   07/02/2021 08:46 AM 6.0 4.6 - 6.5 % Final    Comment:    Glycemic Control Guidelines for People with Diabetes:Non Diabetic:  <6%Goal of Therapy: <7%Additional Action Suggested:  >8%     CBG: Recent Labs  Lab 06/20/22 1105 06/20/22 1557 06/20/22 1945 06/21/22 0000 06/21/22 0408  GLUCAP 191* 184* 209* 215* 182*    Allergies No Known Allergies   Home Medications  Prior to Admission medications   Medication Sig Start Date End Date Taking? Authorizing Provider  acyclovir (ZOVIRAX) 800 MG tablet TAKE 1 TABLET DAILY 01/25/22  Yes Joaquim Nam, MD  aspirin EC 81 MG tablet Take 81 mg by mouth daily. Swallow whole.   Yes [provider]  Calcium Carbonate (CALCIUM 600 PO) Take 2 tablets by mouth daily.   Yes [provider]  GLUCOSAMINE HCL PO Take 1 tablet by mouth daily.    Yes [provider]  losartan (COZAAR) 25 MG tablet Take 1 tablet (25 mg total) by mouth daily. 12/29/21  Yes Tower, Audrie Gallus, MD  Magnesium 500 MG TABS Take 1 tablet by mouth daily.   Yes [provider]  metFORMIN (GLUCOPHAGE) 500 MG tablet TAKE 1 TABLET DAILY 01/25/22  Yes Joaquim Nam, MD  Multiple Vitamin (MULTIVITAMIN) tablet Take 1 tablet by mouth daily. Reported on 01/07/2016   Yes [provider]  Omega-3 Fatty Acids ([employer] FISH OIL PO) Take 1,000 mg by  mouth daily.   Yes [provider]  Red Yeast Rice 600 MG TABS Take 2 tablets (  1,200 mg total) by mouth daily. 01/08/22  Yes Joaquim Nam, MD  rosuvastatin (CRESTOR) 20 MG tablet TAKE 1 TABLET EVERY OTHER  DAY IN THE MORNING. NEEDS TO CALL OFFICE TO SCHEDULE YEARLY EXAM 04/12/22  Yes Joaquim Nam, MD  Testosterone 1.62 % GEL Apply 2 Pump topically daily. 05/12/22  Yes [provider]    Hospital medications Scheduled Meds:  sodium chloride   Intravenous Once   Chlorhexidine Gluconate Cloth  6 each Topical Q0600   free water  100 mL Per Tube Q8H   insulin aspart  0-20 Units Subcutaneous Q4H   insulin glargine-yfgn  8 Units Subcutaneous QHS   multivitamin  1 tablet Per Tube QHS   mouth rinse  15 mL Mouth Rinse 4 times per day   pantoprazole  40 mg Intravenous Q12H   sodium chloride flush  10-40 mL Intracatheter Q12H   Continuous Infusions:   prismasol BGK 4/2.5 300 mL/hr at 06/21/22 0539    prismasol BGK 4/2.5 300 mL/hr at 06/21/22 0541   sodium chloride Stopped (06/19/22 2153)   feeding supplement (NEPRO CARB STEADY) 30 mL/hr at 06/21/22 0700   fluconazole (DIFLUCAN) IV 100 mL/hr at 06/20/22 1900   heparin 10,000 units/ 20 mL infusion syringe 250 Units/hr (06/21/22 0700)   meropenem (MERREM) IV Stopped (06/21/22 0543)   prismasol BGK 4/2.5 1,500 mL/hr at 06/21/22 0543   TPN ADULT (ION) 90 mL/hr at 06/21/22 0700   PRN Meds:.sodium chloride, acetaminophen **OR** acetaminophen, alteplase, guaiFENesin-dextromethorphan, haloperidol lactate, heparin, HYDROmorphone (DILAUDID) injection, hydrOXYzine, ipratropium-albuterol, lidocaine (PF), lidocaine-prilocaine, lip balm, mouth rinse, mouth rinse, oxyCODONE, pentafluoroprop-tetrafluoroeth, sodium chloride, sodium chloride flush    DVT/GI PRX  assessed I Assessed the need for Labs I Assessed the need for Foley I Assessed the need for Central Venous Line Family Discussion when available I Assessed the need for  Mobilization I made an Assessment of medications to be adjusted accordingly Safety Risk assessment completed  CASE DISCUSSED IN MULTIDISCIPLINARY ROUNDS WITH ICU TEAM     Critical Care Time devoted to patient care services described in this note is 55 minutes.  Critical care was necessary to treat /prevent imminent and life-threatening deterioration. Overall, patient is critically ill, prognosis is guarded.  Patient with Multiorgan failure and at high risk for cardiac arrest and death.    Lucie Leather, M.D.  [facility]  Medical Director [facility] Medical Director [facility] Cardio-Pulmonary Department

## 2022-06-22 ENCOUNTER — Other Ambulatory Visit: Payer: Self-pay | Admitting: Family Medicine

## 2022-06-22 DIAGNOSIS — D62 Acute posthemorrhagic anemia: Secondary | ICD-10-CM | POA: Diagnosis not present

## 2022-06-22 DIAGNOSIS — Z515 Encounter for palliative care: Secondary | ICD-10-CM

## 2022-06-22 DIAGNOSIS — Z789 Other specified health status: Secondary | ICD-10-CM

## 2022-06-22 DIAGNOSIS — G9341 Metabolic encephalopathy: Secondary | ICD-10-CM | POA: Diagnosis not present

## 2022-06-22 DIAGNOSIS — R55 Syncope and collapse: Secondary | ICD-10-CM | POA: Diagnosis not present

## 2022-06-22 DIAGNOSIS — K922 Gastrointestinal hemorrhage, unspecified: Secondary | ICD-10-CM | POA: Diagnosis not present

## 2022-06-22 DIAGNOSIS — K8591 Acute pancreatitis with uninfected necrosis, unspecified: Secondary | ICD-10-CM | POA: Diagnosis not present

## 2022-06-22 LAB — RENAL FUNCTION PANEL
Albumin: 2.1 g/dL — ABNORMAL LOW (ref 3.5–5.0)
Anion gap: 8 (ref 5–15)
BUN: 53 mg/dL — ABNORMAL HIGH (ref 8–23)
CO2: 25 mmol/L (ref 22–32)
Calcium: 8.2 mg/dL — ABNORMAL LOW (ref 8.9–10.3)
Chloride: 106 mmol/L (ref 98–111)
Creatinine, Ser: 3.02 mg/dL — ABNORMAL HIGH (ref 0.61–1.24)
GFR, Estimated: 21 mL/min — ABNORMAL LOW (ref >60)
Glucose, Bld: 155 mg/dL — ABNORMAL HIGH (ref 70–99)
Phosphorus: 1.8 mg/dL — ABNORMAL LOW (ref 2.5–4.6)
Potassium: 4 mmol/L (ref 3.5–5.1)
Sodium: 139 mmol/L (ref 135–145)

## 2022-06-22 LAB — CBC WITH DIFFERENTIAL/PLATELET
Abs Immature Granulocytes: 1.03 10*3/uL — ABNORMAL HIGH (ref 0.00–0.07)
Basophils Absolute: 0.1 10*3/uL (ref 0.0–0.1)
Basophils Relative: 0 %
Eosinophils Absolute: 0.1 10*3/uL (ref 0.0–0.5)
Eosinophils Relative: 0 %
HCT: 22.6 % — ABNORMAL LOW (ref 39.0–52.0)
HCT: 22.6 % — ABNORMAL LOW (ref 39.0–52.0)
Hemoglobin: 7.3 g/dL — ABNORMAL LOW (ref 13.0–17.0)
Hemoglobin: 7.3 g/dL — ABNORMAL LOW (ref 13.0–17.0)
Immature Granulocytes: 4 %
Lymphocytes Relative: 4 %
Lymphs Abs: 0.9 10*3/uL (ref 0.7–4.0)
MCH: 31.1 pg (ref 26.0–34.0)
MCH: 27.7 pg (ref 26.0–34.0)
MCHC: 32.3 g/dL (ref 30.0–36.0)
MCHC: 32.3 g/dL (ref 30.0–36.0)
MCV: 96.2 fL (ref 80.0–100.0)
MCV: 85.6 fL (ref 80.0–100.0)
Monocytes Absolute: 2.2 10*3/uL — ABNORMAL HIGH (ref 0.1–1.0)
Monocytes Relative: 9 %
Neutro Abs: 19.5 10*3/uL — ABNORMAL HIGH (ref 1.7–7.7)
Neutrophils Relative %: 83 %
Platelets: 195 10*3/uL (ref 150–400)
Platelets: 203 10*3/uL (ref 150–400)
RBC: 2.35 MIL/uL — ABNORMAL LOW (ref 4.22–5.81)
RBC: 2.64 MIL/uL — ABNORMAL LOW (ref 4.22–5.81)
RDW: 19 % — ABNORMAL HIGH (ref 11.5–15.5)
RDW: 16.9 % — ABNORMAL HIGH (ref 11.5–15.5)
WBC: 20.2 10*3/uL — ABNORMAL HIGH (ref 4.0–10.5)
WBC: 23.8 10*3/uL — ABNORMAL HIGH (ref 4.0–10.5)
nRBC: 0.2 % (ref 0.0–0.2)
nRBC: 0.2 % (ref 0.0–0.2)

## 2022-06-22 LAB — MAGNESIUM
Magnesium: 2.3 mg/dL (ref 1.7–2.4)
Magnesium: 2.3 mg/dL (ref 1.7–2.4)

## 2022-06-22 LAB — BASIC METABOLIC PANEL
Anion gap: 5 (ref 5–15)
BUN: 52 mg/dL — ABNORMAL HIGH (ref 8–23)
CO2: 26 mmol/L (ref 22–32)
Calcium: 8.1 mg/dL — ABNORMAL LOW (ref 8.9–10.3)
Chloride: 108 mmol/L (ref 98–111)
Creatinine, Ser: 3.01 mg/dL — ABNORMAL HIGH (ref 0.61–1.24)
GFR, Estimated: 21 mL/min — ABNORMAL LOW (ref >60)
Glucose, Bld: 175 mg/dL — ABNORMAL HIGH (ref 70–99)
Potassium: 3.8 mmol/L (ref 3.5–5.1)
Sodium: 139 mmol/L (ref 135–145)

## 2022-06-22 LAB — GLUCOSE, CAPILLARY
Glucose-Capillary: 147 mg/dL — ABNORMAL HIGH (ref 70–99)
Glucose-Capillary: 153 mg/dL — ABNORMAL HIGH (ref 70–99)
Glucose-Capillary: 154 mg/dL — ABNORMAL HIGH (ref 70–99)
Glucose-Capillary: 161 mg/dL — ABNORMAL HIGH (ref 70–99)
Glucose-Capillary: 166 mg/dL — ABNORMAL HIGH (ref 70–99)
Glucose-Capillary: 181 mg/dL — ABNORMAL HIGH (ref 70–99)

## 2022-06-22 LAB — HEMOGLOBIN AND HEMATOCRIT, BLOOD
HCT: 21.7 % — ABNORMAL LOW (ref 39.0–52.0)
Hemoglobin: 6.9 g/dL — ABNORMAL LOW (ref 13.0–17.0)

## 2022-06-22 MED ORDER — SODIUM CHLORIDE 0.9% IV SOLUTION: Freq: Once | INTRAVENOUS | Status: AC

## 2022-06-22 MED ORDER — TRACE MINERALS CU-MN-SE-ZN 300-55-60-3000 MCG/ML IV SOLN
INTRAVENOUS | Status: AC
  Filled 2022-06-22: qty 993.6

## 2022-06-22 NOTE — Progress Notes (Signed)
0800 Patient A/O x3 with confusion. Patient in NSR, BPs stable, on 2L Esmeralda. Patient able to follow commands. Patient is restless. CRRT running, no issues. Abdomen firm and distended. Bladder scanned, 43 mls. Will continue to monitor.   1610 Patient states acute pain in lower abdomen and hip. Patient very restless and uncomfortable. PRN medications given.   1300 Family at bedside,updated.   1310 Patient states acute pain in lower abdomen and hip. Repositioned and PRN medications given.   1600 Bladder scan, 93 mls.   1700 Family at bedside, updated.  1830 CRRT currently running, no issues. Will continue to monitor.

## 2022-06-22 NOTE — Progress Notes (Signed)
NAME:  Xavier Esparza, MRN:  [id number], DOB:  [birth year], LOS: 14 ADMISSION DATE:  06/08/2022, CONSULTATION DATE:  19 June 2022 REFERRING MD:  Alford Highland, MD CHIEF COMPLAINT:  Severe pancreatitis, AKI, on CRRT   History of Present Illness:  Patient is a 76 year old who presented to [facility] on 08 June 2022 with severe pancreatitis.  Initial presentation was following a syncopal episode at home.  Patient initially had some hypotension which responded to fluids and modest lactic acidosis which responded to fluid resuscitation.  He has not required pressors during his admission.  Initially noted to be mentating well and then subsequently developing toxic metabolic encephalopathy and needed transfer to the ICU/stepdown area.  He was evaluated by PCCM on 15 June when he was transferred to the stepdown area.  The patient still remained hemodynamically stable and without respiratory distress.  On the 16th June he required dialysis catheter placement due to worsening renal failure and need for hemodialysis.  On 19 June PCCM signed off as though the patient was acutely ill with pancreatitis, there were no critical care issues pending.  Patient was receiving intermittent hemodialysis at that time listed as a stepdown status.  Because of requirement of fluid boluses during hemodialysis the patient was switched to CRRT on the evening of 22 June.  This was an automatic change in status from stepdown to ICU.  The patient continues to remain hemodynamically stable and not requiring ventilatory support at present.  We are asked to reconsult on the patient to continue to assist with any potential critical care issues that may develop.  Pertinent  Medical History  Diabetes mellitus Dyslipidemia Prostate cancer Nephrolithiasis  Significant Hospital Events: Including procedures, antibiotic start and stop dates in addition to other pertinent events   ANTIBIOTICS 06/08/2022 Vancomycin >>06/08/2022 06/08/2022 Flagyl  >>06/09/2022 06/08/2022 Cefepime>>06/09/2022 06/09/2022 PipTazo>>06/17/2022 06/18/2022 Meropenem>> 06/19/2022 Fluconazole>>  EVENTS 06/10/2022 Transferred to SDU 06/11/2022 dialysis catheter placement, hemodialysis initiated 06/17/2022 switch to CRRT, patient changed to ICU status 06/18/2022 EGD done, shows gastric/duodenal ulcers, on PPI twice daily 06/19/2022 PCCM reconsulted, recommendations discussed with hospitalist team, new dialysis catheter placed 06/20/2022 marked improvement in mental status, new HD catheter yesterday, addition of fluconazole 06/22/2022 no acute events overnight, remains on CRRT not requiring vasopressors   Interim History / Subjective:  As outlined above   Objective   Blood pressure (!) 125/55, pulse 83, temperature 98.1 F (36.7 C), temperature source Oral, resp. rate (!) 21, height 6' (1.829 m), weight 96.2 kg, SpO2 99 %.        Intake/Output Summary (Last 24 hours) at 06/22/2022 0930 Last data filed at 06/22/2022 0900 Gross per 24 hour  Intake 2998.19 ml  Output 2907 ml  Net 91.19 ml   Filed Weights   06/20/22 0404 06/21/22 0412 06/22/22 0500  Weight: 98.9 kg 97.1 kg 96.2 kg   Examination: General: Acutely ill-appearing male, NAD resting in bed  HEENT: No scleral edema, supple, no JVD  Lungs: Diminished throughout, even, non labored  Cardiovascular: NSR, rrr, no M/R/G, 2+ radial/1+ distal pulses, generalized edema  Abdomen: Distended, hypoactive bowel sounds x4, tenderness present  Extremities: No cyanosis, clubbing or edema. Neuro: Lethargic disoriented to time/situation, follows commands, PERRLA GU: Foley in place draining clear urine  Resolved Hospital Problem list   N/A  Assessment & Plan:   Severe necrotizing pancreatitis Apparent new nodularity on pancreas and omentum Unclear etiology query due to Crestor (rosuvastatin) Supportive care Tumor markers >>pending Continue TPN Will consider initiating trickle feeds today  Continue  supportive care  Multiple liver abscesses/micro abscesses Cluster of low-attenuation lesion RIGHT hepatic lobe infection versus malignancy Continue meropenem and fluconazole  Tumor markers >> pending ID consulted appreciate input   Toxic metabolic encephalopathy - acute - IMPROVING Ammonia level normal Limit sedatives as much as able Correct metabolic abnormalities as able Address infectious processes Reorientation techniques Maintain wake/sleep cycle  Acute renal failure Nephrology consulted appreciate input: CRRT per recommendations  Avoid nephrotoxins Dose medications according to CRRT New dialysis catheter 6/24  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 Malignancy cannot be excluded Cannot be placed on anticoagulants currently due to gastric/duodenal ulcers  Gastric/duodenal ulcers PPI twice daily No full dose anticoagulation for now due to anemia   Acute blood loss anemia Anemia of critical illness Transfuse for HgB < 7.0 Monitor for signs of bleeding  Best Practice (right click and "Reselect all SmartList Selections" daily)   Diet/type: tubefeeds and TPN - TF at trickle DVT prophylaxis: other Heparin through CRRT circuit GI prophylaxis: PPI Lines: Dialysis Catheter new/24 Foley:  Yes, and it is still needed Code Status:  full code Last date of multidisciplinary goals of care discussion [06/22/2022]  Labs   CBC: Recent Labs  Lab 06/18/22 0407 06/19/22 0216 06/19/22 1433 06/20/22 0352 06/21/22 0414 06/22/22 0357 06/22/22 0458  WBC 25.7* 24.5*  --  27.5* 26.0* 20.2* 23.8*  NEUTROABS 24.2* 21.7*  --  24.5* 21.4*  --  19.5*  HGB 8.0* 8.5* 7.7* 8.7* 8.0* 7.3* 7.3*  HCT 23.6* 26.4* 23.7* 27.0* 24.0* 22.6* 22.6*  MCV 83.4 83.3  --  85.2 84.5 96.2 85.6  PLT 253 237  --  189 209 195 203    Basic Metabolic Panel: Recent Labs  Lab 06/19/22 0406 06/19/22 1433 06/20/22 0352 06/20/22 1558 06/21/22 0414 06/21/22 1555 06/22/22 0357 06/22/22 0458   NA 135 136 140 136 136  136 137  --  139  K 4.1 4.0 4.1 3.9 3.5  3.5 4.0  --  3.8  CL 103 104 109 105 103  103 104  --  108  CO2 23 24 24 25 26  24 25   --  26  GLUCOSE 212* 198* 219* 201* 204*  203* 187*  --  175*  BUN 73* 69* 64* 58* 56*  57* 55*  --  52*  CREATININE 5.71* 5.29* 4.41* 3.90* 3.59*  2.84* 3.34*  --  3.01*  CALCIUM 7.7* 8.0* 8.6* 8.9 9.0  9.0 8.2*  --  8.1*  MG 2.1  --  2.1  --  2.1  --  2.3 2.3  PHOS 5.9* 5.0* 3.6 2.7 2.6  2.6 2.0*  --   --    GFR: Estimated Creatinine Clearance: 24.7 mL/min (A) (by C-G formula based on SCr of 3.01 mg/dL (H)). Recent Labs  Lab 06/19/22 1500 06/20/22 0352 06/21/22 0414 06/22/22 0357 06/22/22 0458  WBC  --  27.5* 26.0* 20.2* 23.8*  LATICACIDVEN 1.3  --   --   --   --     Liver Function Tests: Recent Labs  Lab 06/16/22 0628 06/17/22 0331 06/17/22 1015 06/17/22 1530 06/19/22 1539 06/20/22 0352 06/20/22 1558 06/21/22 0414 06/21/22 1555  AST 106*  --  86*  --  41  --   --  45*  --   ALT 71*  --  58*  --  32  --   --  29  --   ALKPHOS 75  --  77  --  89  --   --  112  --   BILITOT 0.7  --  1.1  --  0.6  --   --  0.4  --   PROT 6.3*  --  6.0*  --  5.3*  --   --  5.7*  --   ALBUMIN 2.4*   < > 2.4*   < > 2.0* 2.1* 2.1* 2.0*  2.1* 2.2*   < > = values in this interval not displayed.   No results for input(s): "LIPASE", "AMYLASE" in the last 168 hours. Recent Labs  Lab 06/19/22 1500  AMMONIA 20    ABG    Component Value Date/Time   PHART 7.3 (L) 06/19/2022 2130   PCO2ART 48 06/19/2022 2130   PO2ART 109 (H) 06/19/2022 2130   HCO3 23.6 06/19/2022 2130   ACIDBASEDEF 3.2 (H) 06/19/2022 2130   O2SAT 98.3 06/19/2022 2130     Coagulation Profile: No results for input(s): "INR", "PROTIME" in the last 168 hours.  Cardiac Enzymes: No results for input(s): "CKTOTAL", "CKMB", "CKMBINDEX", "TROPONINI" in the last 168 hours.  HbA1C: Hgb A1c MFr Bld  Date/Time Value Ref Range Status  01/08/2022 10:34 AM 6.2 4.6 -  6.5 % Final    Comment:    Glycemic Control Guidelines for People with Diabetes:Non Diabetic:  <6%Goal of Therapy: <7%Additional Action Suggested:  >8%   07/02/2021 08:46 AM 6.0 4.6 - 6.5 % Final    Comment:    Glycemic Control Guidelines for People with Diabetes:Non Diabetic:  <6%Goal of Therapy: <7%Additional Action Suggested:  >8%     CBG: Recent Labs  Lab 06/21/22 1601 06/21/22 1942 06/21/22 2348 06/22/22 0336 06/22/22 0728  GLUCAP 187* 134* 177* 153* 147*    Review of Systems: Positives in BOLD   Gen: Denies fever, chills, weight change, fatigue, night sweats HEENT: Denies blurred vision, double vision, hearing loss, tinnitus, sinus congestion, rhinorrhea, sore throat, neck stiffness, dysphagia PULM: Denies shortness of breath, cough, sputum production, hemoptysis, wheezing CV: Denies chest pain, edema, orthopnea, paroxysmal nocturnal dyspnea, palpitations GI: abdominal pain, nausea, vomiting, diarrhea, hematochezia, melena, constipation, change in bowel habits GU: Denies dysuria, hematuria, polyuria, oliguria, urethral discharge Endocrine: Denies hot or cold intolerance, polyuria, polyphagia or appetite change Derm: Denies rash, dry skin, scaling or peeling skin change Heme: Denies easy bruising, bleeding, bleeding gums Neuro: Denies headache, numbness, weakness, slurred speech, loss of memory or consciousness   Allergies No Known Allergies   Home Medications  Prior to Admission medications   Medication Sig Start Date End Date Taking? Authorizing Provider  acyclovir (ZOVIRAX) 800 MG tablet TAKE 1 TABLET DAILY 01/25/22  Yes Joaquim Nam, MD  aspirin EC 81 MG tablet Take 81 mg by mouth daily. Swallow whole.   Yes [provider]  Calcium Carbonate (CALCIUM 600 PO) Take 2 tablets by mouth daily.   Yes [provider]  GLUCOSAMINE HCL PO Take 1 tablet by mouth daily.    Yes [provider]  losartan (COZAAR) 25 MG tablet Take 1 tablet (25 mg  total) by mouth daily. 12/29/21  Yes Tower, Audrie Gallus, MD  Magnesium 500 MG TABS Take 1 tablet by mouth daily.   Yes [provider]  metFORMIN (GLUCOPHAGE) 500 MG tablet TAKE 1 TABLET DAILY 01/25/22  Yes Joaquim Nam, MD  Multiple Vitamin (MULTIVITAMIN) tablet Take 1 tablet by mouth daily. Reported on 01/07/2016   Yes [provider]  Omega-3 Fatty Acids ([employer] FISH OIL PO) Take 1,000 mg by mouth daily.   Yes [provider]  Red Yeast Rice 600 MG TABS Take 2 tablets (1,200 mg total) by mouth daily. 01/08/22  Yes Joaquim Nam, MD  rosuvastatin (CRESTOR) 20 MG tablet TAKE 1 TABLET EVERY OTHER  DAY IN THE MORNING. NEEDS TO CALL OFFICE TO SCHEDULE YEARLY EXAM 04/12/22  Yes Joaquim Nam, MD  Testosterone 1.62 % GEL Apply 2 Pump topically daily. 05/12/22  Yes [provider]    Hospital medications Scheduled Meds:  sodium chloride   Intravenous Once   Chlorhexidine Gluconate Cloth  6 each Topical Q0600   free water  100 mL Per Tube Q8H   insulin aspart  0-20 Units Subcutaneous Q4H   insulin glargine-yfgn  8 Units Subcutaneous QHS   multivitamin  1 tablet Per Tube QHS   mouth rinse  15 mL Mouth Rinse 4 times per day   pantoprazole  40 mg Intravenous Q12H   sodium chloride flush  10-40 mL Intracatheter Q12H   Continuous Infusions:   prismasol BGK 4/2.5 300 mL/hr at 06/21/22 2335    prismasol BGK 4/2.5 300 mL/hr at 06/21/22 2335   sodium chloride Stopped (06/19/22 2153)   fluconazole (DIFLUCAN) IV Stopped (06/21/22 1900)   heparin 10,000 units/ 20 mL infusion syringe 250 Units/hr (06/22/22 0900)   meropenem (MERREM) IV Stopped (06/22/22 0537)   prismasol BGK 4/2.5 1,500 mL/hr at 06/21/22 2335   TPN ADULT (ION) 90 mL/hr at 06/22/22 0900   PRN Meds:.sodium chloride, acetaminophen **OR** acetaminophen, alteplase, guaiFENesin-dextromethorphan, haloperidol lactate, heparin, HYDROmorphone (DILAUDID) injection, hydrOXYzine, ipratropium-albuterol, lidocaine (PF),  lidocaine-prilocaine, lip balm, mouth rinse, mouth rinse, oxyCODONE, pentafluoroprop-tetrafluoroeth, QUEtiapine, sodium chloride, sodium chloride flush   Critical care time: 35 minutes    [name], AGNP  Pulmonary/Critical Care Pager [phone] (please enter 7 digits) PCCM Consult Pager [phone] (please enter 7 digits)

## 2022-06-22 NOTE — Consult Note (Signed)
                                                                                 Consultation Note Date: 06/22/2022   Patient Name: Xavier Esparza  DOB: 06/09/1946  MRN: [id number]  Age / Sex: 76 y.o., male  PCP: Duncan, Graham S, MD Referring Physician: Wieting, Richard, MD  Reason for Consultation: Establishing goals of care  HPI/Patient Profile: 76 y.o. male  with past medical history of prostate cancer, nephrolithiasis, BPH, diabetes type 2, and HTN admitted on 06/08/2022 with syncopal episode at home.  Patient is being treated for severe pancreatitis, portal vein thrombosis, and a liver mass with possible abscess.  Patient also has AKI and is receiving CRRT.  Palliative medicine team was consulted to discuss goals of care.   Clinical Assessment and Goals of Care: I have reviewed medical records including EPIC notes, labs and imaging, assessed the patient and then met with patient's wife Xavier Esparza at bedside to discuss diagnosis prognosis, GOC, EOL wishes, disposition and options.  I introduced Palliative Medicine as specialized medical care for people living with serious illness. It focuses on providing relief from the symptoms and stress of a serious illness. The goal is to improve quality of life for both the patient and the family.  We discussed a brief life review of the patient.  Patient and his wife have been married for 52 years.  They have 1 son who lives in [place].  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  As far as functional and nutritional status PTA wife endorses patient was independent with all ADLs.  She endorses a healthy diet.  She says patient was active by mowing the lawn and doing majority of housework, such as cooking and cleaning.  We discussed patient's current illness and what it means in the larger context of patient's on-going co-morbidities.  Xavier Esparza asked appropriate  questions regarding patient's current health status. Education provided on multiorgan failure, WBCs, H&H, DMII, CRRT, TPN, tube feeds, and ileus.   I attempted to elicit values and goals of care important to the patient.  Xavier Esparza shares she would want to give her husband the "best shot at staying alive".  We discussed quality of life versus quantity of life.    Therapeutic silence and active listening provided for Xavier Esparza to share her thoughts and emotions regarding her husband's current state of health.  She says this is all surprising since 2 to 3 weeks ago patient was in normal health and had no complaints.  We discussed CPR, defibrillation, and use of intubation and mechanical ventilation in the event of a cardiopulmonary arrest.  Xavier Esparza confirms she would want patient to remain full code.  She says that she would want to ensure that she did everything she could to "keep him alive".  When asked how long she would want him to be on a ventilator, she shares that she wants to give her husband the option of being on a ventilator and then being able to remove it if that is necessary.  Full code remains.  Family is facing treatment option decisions,   advanced directive, and anticipatory care needs.  Xavier Esparza has a healthy understanding of patient's poor prognosis but remains hopeful that she will see some improvement in patient's health.   Discussed with patient/family the importance of continued conversation with family and the medical providers regarding overall plan of care and treatment options, ensuring decisions are within the context of the patient's values and GOCs.    Questions and concerns were addressed. The family was encouraged to call with questions or concerns.   Primary Decision Maker NEXT OF KIN  Code Status/Advance Care Planning: Full code  Prognosis:   Unable to determine  Discharge Planning: To Be Determined  Primary Diagnoses: Present on  Admission: **None**   Physical Exam Vitals and nursing note reviewed.  Constitutional:      General: He is not in acute distress.    Appearance: He is normal weight. He is not ill-appearing.  HENT:     Head: Normocephalic and atraumatic.     Mouth/Throat:     Mouth: Mucous membranes are moist.  Cardiovascular:     Rate and Rhythm: Normal rate.     Pulses: Normal pulses.  Pulmonary:     Effort: Pulmonary effort is normal.  Abdominal:     Palpations: Abdomen is soft.  Musculoskeletal:     Comments: Generalized weakness  Skin:    General: Skin is warm.  Neurological:     Comments: Oriented to self, does not complete other orientation questions     Palliative Assessment/Data: 30%     I discussed this patient's plan of care with RN Andi Hence, patient, patient's wife Xavier Esparza.  Thank you for this consult. Palliative medicine will continue to follow and assist holistically.   Time Total: 75 minutes Greater than 50%  of this time was spent counseling and coordinating care related to the above assessment and plan.  Signed by: Jordan Hawks, DNP, FNP-BC Palliative Medicine    Please contact Palliative Medicine Team phone at [phone] for questions and concerns.  For individual provider: See Shea Evans

## 2022-06-22 NOTE — Hospital Course (Addendum)
76 y.o. male with medical history significant for DM, BPH, history of prostate cancer, kidney stones status post lithotripsy, negative colonoscopy 2022 (3 benign polyps), who presents to the ED by EMS following a syncopal episode at home while walking to the bathroom.  Wife said she heard a loud thud and went to find him on the floor.  She said he was awake when she got there but he could not help himself up and she called 911.  Patient states he was in his usual state of health but about 2 weeks prior he saw a little blood in his urine similar to when he had his kidney stone.  He saw his urologist yesterday who gave him a prescription for Levaquin.  He otherwise denied feeling unwell previous to the episode.  He denied chest pain, shortness of breath, headache or visual disturbance, one-sided numbness weakness or tingling, nausea or vomiting, abdominal pain, urinary frequency or burning or diarrhea.[facility] reported BP 76/45 and he was treated with IM epinephrine and a 400 mL fluid bolus.  He was awake and alert x4 by arrival.  After his arrival in the emergency room he developed abdominal pain and vomiting.   Initial CT abdomen pelvis with moderate acute pancreatitis no evidence of pancreatic necrosis or pseudocyst.  Follow-up MRI abdomen concerning for possible choledocholithiasis with evidence of necrotizing pancreatitis.  Case discussed with GI Dr. Marius Ditch at [facility].   There was mention of possible portal vein thrombosis.  Heparin GTT was initially started however patient developed suspected acute hematuria so heparin GTT was stopped.  VQ scan ordered and perfusion component will be completed prior to patient transfer.  6/15: Clinical status overall unchanged.  Patient remains with abdominal pain associated with nausea.  Hemodynamically stable.  Mild elevation in white count.  Lipase and LFTs have improved over interval.  Discussed case with GI and general surgery.  Likely that patient's presentation is secondary  to necrotizing pancreatitis.  Currently as there is no CBD stone and there is no strong indication for transfer for ERCP.  From a surgical standpoint no intervention is recommended.  There is nothing from an interventional radiology standpoint to drain.  As such we will discontinue transfer and keep patient in [facility].   6/16: No appreciable change in clinical status.  LFTs are downtrending.  White count slightly downtrending.  No fevers noted over interval.  Increasing abdominal distention noted.  Ascites and peripancreatic fluid seen on repeat CAT scan today.  Discussed case with PCCM, nephrology, GI.  Will attempt postpyloric feedings.  Kidney function continues to deteriorate.  Nephrology will initiate hemodialysis today.   6/17: No change in clinical status.  White count remains minimally elevated.  No fevers over interval.  Remains with abdominal distention.  Started HD yesterday.  We will run another round of HD today.   6/18: No appreciable clinical changes.  White blood cell count slightly downtrending.  Creatinine worsened despite 2 rounds of hemodialysis.  Remains with hematuria despite stopping heparin 2 days ago.   6/19: Increasing abd distention.  KUB consistent with ileus.  [place] negative for ascites   6/20: Postpyloric Dobbhoff discontinued.  NGT placed.  Tip in stomach. Set to low intermittent suction.  Rectal tube in place.  Abdominal distention improved.  White blood cell count increased to 22  For the week of 6/21 through 06/22/2022:  the patient had an upper GI bleed and was transfused 3 units of packed red blood cells.  He still remains anemic.  EGD  showing gastric ulcers.  Patient was initially on Protonix drip now on Protonix twice a day.  Last hemoglobin 7.3.  CRRT was started on 06/17/2022 and continuing as per nephrology.  The patient is still anuric.  The patient severe necrotizing pancreatitis.  Repeat CT scan showed increased nodularity in the omentum.  No discrete abscess  about the pancreas.  Did see possible liver abscess.  Antibiotics were changed over to meropenem and Diflucan.  Repeat blood cultures on 06/18/2022 are negative.  The patient did have an ileus on x-ray so tube feeds were stopped.  Patient is on TPN and has a PICC line.  The patient's mental status has waxed and waned during the week and CT scan of the head was negative.  6/28: Patient continued to experience abdominal pain.  Nephrology is planning to continue CRRT for now.  Continue to have NG tube but secretions are decreasing.  Remained on TPN.  6/29: Clinically seems stable.  Continue to require pain medications.  Nephrology is going to start him on intermittent dialysis and stopping CRRT.  Also trying trickle feeds through NG tube as NG tube secretions remained minimal. Hypophosphatemia which is being repleted. Anemia panel consistent with anemia of chronic disease with some iron deficiency. Ferritin levels elevated. We will start him on iron supplement once tube feed established.  6/30: Patient with very slow improvement.  Able to tolerate trickle food, rate was increased today. Started on intermittent dialysis.  Going for permanent HD catheter today.  7/1: Patient continued to have low-grade fever at 100.  Had his HD catheter placed by vascular surgery yesterday.  Hemoglobin dropped to 6.2 this morning, getting 1 unit of PRBC. Ordered repeat CT abdomen due to persistent low-grade fever and leukocytosis. NG tube was pulled out by patient, will try p.o. intake before replacing.  7/2: Patient becomes more confused and agitated overnight.  Significant elevation of BUN to 103 and creatinine of 7.21, last dialysis was on Friday.  Tolerating some p.o. intake, remained on TPN. CT abdomen was completed last night and shows as below. 1. Findings consistent with marked severity acute necrotizing pancreatitis, with interval increase in peripancreatic fluid since the prior study. 2. Interval  development of marked severity enteritis involving multiple loops of distal small bowel. 3. Extensive omental nodularity and thickening throughout the abdomen and pelvis, concerning for the presence of metastatic disease. 4. 6 mm nonobstructing right renal calculus. 5. Colonic diverticulosis. 6. Moderate to marked severity bibasilar consolidation with a small right pleural effusion. 7. Multiple cystic appearing areas scattered throughout the liver parenchyma, consistent with hepatic cysts. 8. Stable heterogeneous density within the right hepatic lobe which corresponds to a prior region of portal vein thrombosis and suspected micro abscesses. 9. Small hiatal hernia. 10. Interval removal of the nasogastric tube and Foley catheter seen on the prior study. 11. Aortic atherosclerosis. Patient has an history of prostate cancer, checking PSA for concern of omental nodularity.  7/3: Patient was becoming more lethargic and somnolent with markedly elevated BUN at 140.  Denies any pain but abdomen seems more distended so KUB was obtained which was without any significant abnormality.  NG tube was placed for suctioning which resulted in improvement in abdominal distention.  Wife requested for [facility] transfer stating that Dr. Tillie Rung has accepted him and he can go to [facility] now.  Contacted [facility] transfer center and apparently she is a GI provider and does not have admitting privileges.  Patient has to go either through medical or surgical  services, request was made again and had a long discussion with their internal medicine service but they declined the transfer stating that nothing much can be added medically for him. Their surgery service wants a surgeon to surgeon call to discuss about any surgical possibility. Going for dialysis today. Another unit of PRBC was ordered today.  7/4: Patient remained quite lethargic, some improvement as compared to yesterday after getting dialysis.  Hemoglobin  increased to 8 after transfusion and then decreased to 7 this morning.  Melanotic stools in the bag.  Markedly distended abdomen with diffuse tenderness. Trickle feed was stopped and NG tube was placed on suctioning. General surgery was reconsulted and they tried reaching someone at [facility] surgery as patient will require a very specialized procedure if possible which is beyond the scope of current facility. Per Dr. Hampton Abbot he tried multiple times and could not reach anyone. GI was again consulted today.  Ordered 2 more unit of PRBC.  Plan another EGD for tomorrow. Family very frustrated with the current situation and our inability to transfer him to a tertiary care center despite multiple attempts.  They are considering transferring him to [facility] ED privately either via personal transportation or by hiring a private ambulance. Had a long discussion with daughter and wife.  Patient is hemodynamically stable but is very high risk for deterioration and death.  He is critically ill.  We we will discharge him if that is what they want to do, no final decision yet.  Patient later left AMA and daughter transported privately to [facility] for further management.

## 2022-06-23 DIAGNOSIS — D62 Acute posthemorrhagic anemia: Secondary | ICD-10-CM | POA: Diagnosis not present

## 2022-06-23 DIAGNOSIS — G9341 Metabolic encephalopathy: Secondary | ICD-10-CM | POA: Diagnosis not present

## 2022-06-23 DIAGNOSIS — K8591 Acute pancreatitis with uninfected necrosis, unspecified: Secondary | ICD-10-CM | POA: Diagnosis not present

## 2022-06-23 DIAGNOSIS — N179 Acute kidney failure, unspecified: Secondary | ICD-10-CM | POA: Diagnosis not present

## 2022-06-23 LAB — CBC WITH DIFFERENTIAL/PLATELET
Abs Immature Granulocytes: 1.19 10*3/uL — ABNORMAL HIGH (ref 0.00–0.07)
Basophils Absolute: 0.1 10*3/uL (ref 0.0–0.1)
Basophils Relative: 0 %
Eosinophils Absolute: 0.1 10*3/uL (ref 0.0–0.5)
Eosinophils Relative: 0 %
HCT: 24.5 % — ABNORMAL LOW (ref 39.0–52.0)
Hemoglobin: 8.2 g/dL — ABNORMAL LOW (ref 13.0–17.0)
Immature Granulocytes: 5 %
Lymphocytes Relative: 3 %
Lymphs Abs: 0.8 10*3/uL (ref 0.7–4.0)
MCH: 28.7 pg (ref 26.0–34.0)
MCHC: 33.5 g/dL (ref 30.0–36.0)
MCV: 85.7 fL (ref 80.0–100.0)
Monocytes Absolute: 2.3 10*3/uL — ABNORMAL HIGH (ref 0.1–1.0)
Monocytes Relative: 9 %
Neutro Abs: 19.5 10*3/uL — ABNORMAL HIGH (ref 1.7–7.7)
Neutrophils Relative %: 83 %
Platelets: 199 10*3/uL (ref 150–400)
RBC: 2.86 MIL/uL — ABNORMAL LOW (ref 4.22–5.81)
RDW: 16.7 % — ABNORMAL HIGH (ref 11.5–15.5)
Smear Review: NORMAL
WBC: 23.9 10*3/uL — ABNORMAL HIGH (ref 4.0–10.5)
nRBC: 0.2 % (ref 0.0–0.2)

## 2022-06-23 LAB — RENAL FUNCTION PANEL
Albumin: 1.8 g/dL — ABNORMAL LOW (ref 3.5–5.0)
Albumin: 1.9 g/dL — ABNORMAL LOW (ref 3.5–5.0)
Anion gap: 6 (ref 5–15)
Anion gap: 6 (ref 5–15)
BUN: 53 mg/dL — ABNORMAL HIGH (ref 8–23)
BUN: 50 mg/dL — ABNORMAL HIGH (ref 8–23)
CO2: 24 mmol/L (ref 22–32)
CO2: 25 mmol/L (ref 22–32)
Calcium: 8.2 mg/dL — ABNORMAL LOW (ref 8.9–10.3)
Calcium: 8.1 mg/dL — ABNORMAL LOW (ref 8.9–10.3)
Chloride: 109 mmol/L (ref 98–111)
Chloride: 107 mmol/L (ref 98–111)
Creatinine, Ser: 2.99 mg/dL — ABNORMAL HIGH (ref 0.61–1.24)
Creatinine, Ser: 2.62 mg/dL — ABNORMAL HIGH (ref 0.61–1.24)
GFR, Estimated: 21 mL/min — ABNORMAL LOW (ref >60)
GFR, Estimated: 24 mL/min — ABNORMAL LOW (ref >60)
Glucose, Bld: 192 mg/dL — ABNORMAL HIGH (ref 70–99)
Glucose, Bld: 167 mg/dL — ABNORMAL HIGH (ref 70–99)
Phosphorus: 2 mg/dL — ABNORMAL LOW (ref 2.5–4.6)
Phosphorus: 1.7 mg/dL — ABNORMAL LOW (ref 2.5–4.6)
Potassium: 4 mmol/L (ref 3.5–5.1)
Potassium: 3.9 mmol/L (ref 3.5–5.1)
Sodium: 139 mmol/L (ref 135–145)
Sodium: 138 mmol/L (ref 135–145)

## 2022-06-23 LAB — CULTURE, BLOOD (ROUTINE X 2)
Culture: NO GROWTH
Culture: NO GROWTH
Special Requests: ADEQUATE
Special Requests: ADEQUATE

## 2022-06-23 LAB — GLUCOSE, CAPILLARY
Glucose-Capillary: 199 mg/dL — ABNORMAL HIGH (ref 70–99)
Glucose-Capillary: 170 mg/dL — ABNORMAL HIGH (ref 70–99)
Glucose-Capillary: 168 mg/dL — ABNORMAL HIGH (ref 70–99)
Glucose-Capillary: 156 mg/dL — ABNORMAL HIGH (ref 70–99)
Glucose-Capillary: 181 mg/dL — ABNORMAL HIGH (ref 70–99)
Glucose-Capillary: 177 mg/dL — ABNORMAL HIGH (ref 70–99)

## 2022-06-23 LAB — HEMOGLOBIN AND HEMATOCRIT, BLOOD
HCT: 24 % — ABNORMAL LOW (ref 39.0–52.0)
Hemoglobin: 7.8 g/dL — ABNORMAL LOW (ref 13.0–17.0)

## 2022-06-23 LAB — MAGNESIUM: Magnesium: 2.4 mg/dL (ref 1.7–2.4)

## 2022-06-23 MED ORDER — ALBUMIN HUMAN 25 % IV SOLN
25.0000 g | Freq: Two times a day (BID) | INTRAVENOUS | Status: DC
  Administered 2022-06-23 – 2022-06-29 (×13): 25 g via INTRAVENOUS
  Filled 2022-06-23 (×14): qty 100

## 2022-06-23 MED ORDER — TRACE MINERALS CU-MN-SE-ZN 300-55-60-3000 MCG/ML IV SOLN
INTRAVENOUS | Status: AC
  Filled 2022-06-23: qty 993.6

## 2022-06-23 NOTE — Progress Notes (Signed)
Nutrition Follow-up  DOCUMENTATION CODES:   Obesity unspecified  INTERVENTION:   -TPN management per pharmacy -RD will follow for ability to resume enteral nutrition: recommend:  Initiate Vital AF 1.2 @ 25 ml/hr via NGT and increase by 10 ml every 4 hours to goal rate of 75 ml/hr.   30 ml Free water flush every 4 hours to maintain tube patebcy  Tube feeding regimen provides 2160 kcal (92% of needs), 135 grams of protein, and 1460 ml of H2O. Total free water: 1640 ml daily  NUTRITION DIAGNOSIS:   Inadequate oral intake related to acute illness as evidenced by NPO status.  Ongoing  GOAL:   Patient will meet greater than or equal to 90% of their needs  Met with TPN  MONITOR:   Diet advancement, Labs, Weight trends, Skin, I & O's  REASON FOR ASSESSMENT:   Consult Enteral/tube feeding initiation and management  ASSESSMENT:   76 y/o male with h/o DM, BPH, prostate cancer, hiatal hernia, kidney stones and HTN who is admitted with severe necrotizing pancreatitis, portal vein thrombosis, liver lesions, AKI and sepsis.  6/16- s/p fluoroscopy guided post pyloric NGT, HD initiated 6/19- rectal tube inserted 6/22- CRRT initiated, PICC placed, TPN initiated 6/25- TF initiated (Nepro @ 30 ml/hr) 6/26- TF held due to ileus  Reviewed I/O's: +1.7 ml x 24 hours and +2.4 L since 06/09/22  UOP: 0 ml x 24 hours  NGT output: 50 ml x 24 hours   Case discussed with RN, MD, and during ICU rounds. Pt's mentation has improved and is oriented when asked questions. His abdomen is less distended; per MD will re-assess re-starting TF in the next few days.   Palliative care following; pt remains full code. Plan for permacath. He remains on CRRT.   Pt remains NPO with NGT connected to low, intermittent suction. Plan to continue TPN at goal rate- 90 ml/hr, which provides 2499 kcals and 149 grams protein, meeting 100% of estimated kcal and protein needs.   Labs reviewed: CBGS: [phone]  (inpatient orders for glycemic control are 0-20 units insulin aspart every 4 hours).    Diet Order:   Diet Order             Diet - low sodium heart healthy                   EDUCATION NEEDS:   Education needs have been addressed  Skin:  Skin Assessment: Reviewed RN Assessment  Last BM:  06/23/22  Height:   Ht Readings from Last 1 Encounters:  06/10/22 6' (1.829 m)    Weight:   Wt Readings from Last 1 Encounters:  06/23/22 94.3 kg    Ideal Body Weight:  80.9 kg  BMI:  Body mass index is 28.2 kg/m.  Estimated Nutritional Needs:   Kcal:  7209-4709  Protein:  130-155 grams  Fluid:  > 2 L    Loistine Chance, RD, LDN, [place] Registered Dietitian II Certified Diabetes Care and Education Specialist Please refer to [facility] for RD and/or RD on-call/weekend/after hours pager

## 2022-06-23 NOTE — Progress Notes (Signed)
CRRT filter clogged off around 2300 last night, treatment stopped and no blood was returned. NP notified and Hbg was checked, pt was at 6.9. One unit of blood was ordered and given. CRRT was restarted at 0000, with standard filter and systemic heparin syringe in place. Pt remains confused, but less restless throughout the shift. 1 dose of dilaudid was given this morning for pain per pt's request. Vitals remain stable at this time.

## 2022-06-23 NOTE — Progress Notes (Signed)
Palliative Care Progress Note, Assessment & Plan   Patient Name: Xavier Esparza       Date: 06/23/2022 DOB: 1946-12-21  Age: 76 y.o. MRN#: [id number] Attending Physician: Lorella Nimrod, MD Primary Care Physician: Tonia Ghent, MD Admit Date: 06/08/2022  Reason for Consultation/Follow-up: Establishing goals of care  Subjective: Patient is lying in bed in no apparent distress.  CRRT is running.  NG to right nare.  Patient is able to acknowledge my presence but quickly closes his eyes and returns to rest.  He is fidgety but is compliant with leaving lines and drains alone.  Mitts are in place.  Wife is at bedside.  HPI:  76 y.o. male  with past medical history of prostate cancer, nephrolithiasis, BPH, diabetes type 2, and HTN admitted on 06/08/2022 with syncopal episode at home.  Patient is being treated for severe pancreatitis, portal vein thrombosis, and a liver mass with possible abscess.  Patient also has AKI and is receiving CRRT.  Palliative medicine team was consulted to discuss goals of care.   Summary of counseling/coordination of care: After reviewing the patient's chart and assessing the patient at bedside, I spoke with the patient's nurse at bedside.  She shared that patient had an uneventful night but that he did clot off on his CRRT.  However, CRRT was restarted at midnight and patient has had no issues with CRRT since.  I then spoke with patient's wife at bedside. She continues to hope for improvement.  She is taking it 1 day at a time.  She shares patient son arrived from [place] yesterday evening.  I again reiterated to [facility] that palliative medicine is an extra layer of support and a guide on the side if she and her husband navigate these medical challenges and decisions.  Reginold Agent is  appreciative of our support.   Plan remains for full code. Patient and patient's wife are accepting of any offered, available, and appropriate medical interventions to sustain patient's life.  PMT will continue to follow the patient throughout his hospitalization.  Code Status: Full code  Prognosis: Unable to determine  Discharge Planning: To Be Determined  Care plan was discussed with patient, patient's wife, RN  Physical Exam Vitals reviewed.  Constitutional:      General: He is not in acute distress.    Appearance: Normal appearance. He is not toxic-appearing.  HENT:     Head: Normocephalic.     Nose:     Comments: NG to right nare    Mouth/Throat:     Mouth: Mucous membranes are moist.  Cardiovascular:     Rate and Rhythm: Normal rate.     Pulses: Normal pulses.  Pulmonary:     Effort: Pulmonary effort is normal.  Abdominal:     Palpations: Abdomen is soft.  Musculoskeletal:     Comments: Fidgety, restless  Neurological:     Comments: Oriented to self, compliant with re-direction  Psychiatric:        Mood and Affect: Mood normal.             Palliative Assessment/Data: 40%    Total Time 25 minutes  Greater than 50%  of this time was spent counseling  and coordinating care related to the above assessment and plan.  Thank you for allowing the Palliative Medicine Team to assist in the care of this patient.  [place] Ilsa Iha, FNP-BC Palliative Medicine Team Team Phone # [phone]

## 2022-06-23 NOTE — Progress Notes (Signed)
[facility]  ROUNDING NOTE   Subjective:   Patient seen at bedside in ICU Wife at bedside, patient resting comfortably Alert and oriented, confusion at times TPN at 68m/hr NGT in place CRRT remains  No urine output, low to no volume on bladder scan  Objective:  Vital signs in last 24 hours:  Temp:  [98 F (36.7 C)-100.2 F (37.9 C)] 98 F (36.7 C) (06/28 1123) Pulse Rate:  [49-92] 73 (06/28 1123) Resp:  [17-30] 17 (06/28 1123) BP: (88-133)/(57-75) 112/65 (06/28 1100) SpO2:  [75 %-100 %] 95 % (06/28 1123) Weight:  [94.3 kg] 94.3 kg (06/28 0418)  Weight change: -1.9 kg Filed Weights   06/21/22 0412 06/22/22 0500 06/23/22 0418  Weight: 97.1 kg 96.2 kg 94.3 kg    Intake/Output: I/O last 3 completed shifts: In: 4748.7 [I.V.:3285.9; Blood:370.8; Other:10; NG/GT:145; IV PPZWCHENID:782]Out: 44235[Emesis/NG output:50; Other:4529]   Intake/Output this shift:  Total I/O In: 432.9 [I.V.:379.9; IV Piggyback:53] Out: 436 [Other:436]  Physical Exam: General: NAD  Head: Normocephalic, atraumatic. Dry oral mucosal membranes  Eyes: Anicteric  Lungs:  Basilar rhonchi, normal effort, Melvin O2  Heart: Regular rate and rhythm  Abdomen:  Firm, distended, mild diffuse tenderness  Extremities: Trace peripheral edema.  Neurologic: Alert, simple responses  Skin: Warm, dry  Access: Femoral  dialysis catheter 06/19/2022.      Basic Metabolic Panel: Recent Labs  Lab 06/20/22 0352 06/20/22 1558 06/21/22 0414 06/21/22 1555 06/22/22 0357 06/22/22 0458 06/22/22 1550 06/23/22 0408  NA 140 136 136  136 137  --  139 139 139  K 4.1 3.9 3.5  3.5 4.0  --  3.8 4.0 4.0  CL 109 105 103  103 104  --  108 106 109  CO2 '24 25 26  24 25  '$ --  '26 25 24  '$ GLUCOSE 219* 201* 204*  203* 187*  --  175* 155* 192*  BUN 64* 58* 56*  57* 55*  --  52* 53* 53*  CREATININE 4.41* 3.90* 3.59*  2.84* 3.34*  --  3.01* 3.02* 2.99*  CALCIUM 8.6* 8.9 9.0  9.0 8.2*  --  8.1* 8.2* 8.2*  MG 2.1   --  2.1  --  2.3 2.3  --  2.4  PHOS 3.6 2.7 2.6  2.6 2.0*  --   --  1.8* 2.0*     Liver Function Tests: Recent Labs  Lab 06/17/22 1015 06/17/22 1530 06/19/22 1539 06/20/22 0352 06/20/22 1558 06/21/22 0414 06/21/22 1555 06/22/22 1550 06/23/22 0408  AST 86*  --  41  --   --  45*  --   --   --   ALT 58*  --  32  --   --  29  --   --   --   ALKPHOS 77  --  89  --   --  112  --   --   --   BILITOT 1.1  --  0.6  --   --  0.4  --   --   --   PROT 6.0*  --  5.3*  --   --  5.7*  --   --   --   ALBUMIN 2.4*   < > 2.0*   < > 2.1* 2.0*  2.1* 2.2* 2.1* 1.8*   < > = values in this interval not displayed.    No results for input(s): "LIPASE", "AMYLASE" in the last 168 hours. Recent Labs  Lab 06/19/22 1500  AMMONIA 20  CBC: Recent Labs  Lab 06/19/22 0216 06/19/22 1433 06/20/22 0352 06/21/22 0414 06/22/22 0357 06/22/22 0458 06/22/22 2228 06/23/22 0408  WBC 24.5*  --  27.5* 26.0* 20.2* 23.8*  --  23.9*  NEUTROABS 21.7*  --  24.5* 21.4*  --  19.5*  --  19.5*  HGB 8.5*   < > 8.7* 8.0* 7.3* 7.3* 6.9* 8.2*  HCT 26.4*   < > 27.0* 24.0* 22.6* 22.6* 21.7* 24.5*  MCV 83.3  --  85.2 84.5 96.2 85.6  --  85.7  PLT 237  --  189 209 195 203  --  199   < > = values in this interval not displayed.     Cardiac Enzymes: No results for input(s): "CKTOTAL", "CKMB", "CKMBINDEX", "TROPONINI" in the last 168 hours.   BNP: Invalid input(s): "POCBNP"  CBG: Recent Labs  Lab 06/22/22 1932 06/22/22 2334 06/23/22 0404 06/23/22 0803 06/23/22 1155  GLUCAP 154* 181* 199* 170* 168*     Microbiology: Results for orders placed or performed during the hospital encounter of 06/08/22  Blood culture (routine x 2)     Status: None   Collection Time: 06/08/22  1:33 PM   Specimen: BLOOD  Result Value Ref Range Status   Specimen Description BLOOD LEFT HAND  Final   Special Requests   Final    BOTTLES DRAWN AEROBIC AND ANAEROBIC Blood Culture results may not be optimal due to an  inadequate volume of blood received in culture bottles   Culture   Final    NO GROWTH 5 DAYS Performed at [facility], [address] [place], [place] [address]    Report Status 06/13/2022 FINAL  Final  Blood culture (routine x 2)     Status: None   Collection Time: 06/08/22  2:15 PM   Specimen: BLOOD  Result Value Ref Range Status   Specimen Description BLOOD BLOOD LEFT WRIST  Final   Special Requests   Final    BOTTLES DRAWN AEROBIC AND ANAEROBIC Blood Culture results may not be optimal due to an inadequate volume of blood received in culture bottles   Culture   Final    NO GROWTH 5 DAYS Performed at [facility], [address], [place], [place] [address]    Report Status 06/13/2022 FINAL  Final  MRSA Next Gen by PCR, Nasal     Status: None   Collection Time: 06/10/22  4:14 PM   Specimen: Nasal Mucosa; Nasal Swab  Result Value Ref Range Status   MRSA by PCR Next Gen NOT DETECTED NOT DETECTED Final    Comment: (NOTE) The GeneXpert MRSA Assay (FDA approved for NASAL specimens only), is one component of a comprehensive MRSA colonization surveillance program. It is not intended to diagnose MRSA infection nor to guide or monitor treatment for MRSA infections. Test performance is not FDA approved in patients less than 25 years old. Performed at [facility], [address] [place], [place] [address]   SARS Coronavirus 2 by RT PCR (hospital order, performed in [facility] hospital lab) *cepheid single result test* Anterior Nasal Swab     Status: None   Collection Time: 06/10/22  5:17 PM   Specimen: Anterior Nasal Swab  Result Value Ref Range Status   SARS Coronavirus 2 by RT PCR NEGATIVE NEGATIVE Final    Comment: (NOTE) SARS-CoV-2 target nucleic acids are NOT DETECTED.  The SARS-CoV-2 RNA is generally detectable in upper and lower respiratory specimens during the acute phase of infection. The  lowest concentration of SARS-CoV-2 viral copies this  assay can detect is 250 copies / mL. A negative result does not preclude SARS-CoV-2 infection and should not be used as the sole basis for treatment or other patient management decisions.  A negative result may occur with improper specimen collection / handling, submission of specimen other than nasopharyngeal swab, presence of viral mutation(s) within the areas targeted by this assay, and inadequate number of viral copies (<250 copies / mL). A negative result must be combined with clinical observations, patient history, and epidemiological information.  Fact Sheet for Patients:   https://www.patel.info/  Fact Sheet for Healthcare Providers: https://hall.com/  This test is not yet approved or  cleared by the [place] FDA and has been authorized for detection and/or diagnosis of SARS-CoV-2 by FDA under an Emergency Use Authorization (EUA).  This EUA will remain in effect (meaning this test can be used) for the duration of the COVID-19 declaration under Section 564(b)(1) of the Act, 21 U.S.C. section 360bbb-3(b)(1), unless the authorization is terminated or revoked sooner.  Performed at [facility], [address] [place], [place] [address]   Culture, blood (Routine X 2) w Reflex to ID Panel     Status: None   Collection Time: 06/18/22 10:03 AM   Specimen: BLOOD  Result Value Ref Range Status   Specimen Description BLOOD RIGHT HAND  Final   Special Requests   Final    BOTTLES DRAWN AEROBIC ONLY Blood Culture adequate volume   Culture   Final    NO GROWTH 5 DAYS Performed at [facility], [address] [place], [place] [address]    Report Status 06/23/2022 FINAL  Final  Culture, blood (Routine X 2) w Reflex to ID Panel     Status: None   Collection Time: 06/18/22 10:20 AM   Specimen: BLOOD  Result Value Ref Range Status   Specimen Description BLOOD LEFT [facility]  Final   Special Requests   Final    BOTTLES  DRAWN AEROBIC AND ANAEROBIC Blood Culture adequate volume   Culture   Final    NO GROWTH 5 DAYS Performed at [facility], [address], [place], [place] [address]    Report Status 06/23/2022 FINAL  Final    Coagulation Studies: No results for input(s): "LABPROT", "INR" in the last 72 hours.   Urinalysis: No results for input(s): "COLORURINE", "LABSPEC", "PHURINE", "GLUCOSEU", "HGBUR", "BILIRUBINUR", "KETONESUR", "PROTEINUR", "UROBILINOGEN", "NITRITE", "LEUKOCYTESUR" in the last 72 hours.  Invalid input(s): "APPERANCEUR"     Imaging: No results found.   Medications:     prismasol BGK 4/2.5 300 mL/hr at 06/23/22 1118    prismasol BGK 4/2.5 300 mL/hr at 06/23/22 1118   sodium chloride Stopped (06/19/22 2153)   albumin human 60 mL/hr at 06/23/22 1100   fluconazole (DIFLUCAN) IV Stopped (06/22/22 2205)   heparin 10,000 units/ 20 mL infusion syringe 250 Units/hr (06/23/22 1100)   meropenem (MERREM) IV Stopped (06/23/22 0542)   prismasol BGK 4/2.5 1,500 mL/hr at 06/23/22 1118   TPN ADULT (ION) 90 mL/hr at 06/23/22 1100   TPN ADULT (ION)      Chlorhexidine Gluconate Cloth  6 each Topical Q0600   insulin aspart  0-20 Units Subcutaneous Q4H   insulin glargine-yfgn  8 Units Subcutaneous QHS   multivitamin  1 tablet Per Tube QHS   mouth rinse  15 mL Mouth Rinse 4 times per day   pantoprazole  40 mg Intravenous Q12H   sodium chloride flush  10-40 mL Intracatheter Q12H   sodium chloride, acetaminophen **OR** acetaminophen, alteplase, guaiFENesin-dextromethorphan, haloperidol lactate, heparin, HYDROmorphone (DILAUDID) injection, hydrOXYzine, ipratropium-albuterol, lidocaine (PF), lidocaine-prilocaine, lip balm, mouth rinse, mouth rinse, oxyCODONE, pentafluoroprop-tetrafluoroeth, QUEtiapine, sodium chloride, sodium chloride flush  Assessment/ Plan:  Mr. Xavier Esparza is a 76 y.o.  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  Normal kidney function prior to admission.  IV contrast exposure on 06/08/2022 and 06/09/2022.  Renal ultrasound negative for obstruction.  Urinalysis shows hematuria and mild proteinuria.   ?  Gross hematuria related to Foley catheter.  Patient has history of prostate cancer.   Received first dialysis treatment on June 16.   Patient has required CRRT starting June 22 CRRT remains at net zero. Frequent dialyzer changes due to clotting. Continue current treatments.   Lab Results  Component Value Date   CREATININE 2.99 (H) 06/23/2022   CREATININE 3.02 (H) 06/22/2022   CREATININE 3.01 (H) 06/22/2022    Intake/Output Summary (Last 24 hours) at 06/23/2022 1216 Last data filed at 06/23/2022 1100 Gross per 24 hour  Intake 3365.63 ml  Output 3351 ml  Net 14.63 ml    Acute pancreatitis, severe necrotizing.  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   Supportive care with antiemetics and pain control.  Receiving Meropenem and antifungal therapy managed by primary team.  Receiving TPN. Albumin 1.8, will order Albumin 25g twice daily.    Diabetes mellitus type 2 without complication.Home regimen includes metformin.  Hemoglobin A1c 6.2 on 01/08/2022.  -Primary team to manage SSI  Acute ileus, receiving IV narcotics for severe acute pancreatitis. Tube feeds stopped and NGT placed to LWIS. Rectal tube placed  EGD showed 3 gastric ulcers, no active bleeding noted.    Monitoring for bleeding with use of  heparin with CRRT  Nurse reported BM this morning   LOS: [place] 6/28/202312:16 PM

## 2022-06-23 NOTE — Progress Notes (Signed)
Progress Note   Patient: Xavier Esparza OFB:[id number] DOB: [birth year] DOA: 06/08/2022     15 DOS: the patient was seen and examined on 06/23/2022   Brief hospital course: 76 y.o. male with medical history significant for DM, BPH, history of prostate cancer, kidney stones status post lithotripsy, negative colonoscopy 2022 (3 benign polyps), who presents to the ED by EMS following a syncopal episode at home while walking to the bathroom.  Wife said she heard a loud thud and went to find him on the floor.  She said he was awake when she got there but he could not help himself up and she called 911.  Patient states he was in his usual state of health but about 2 weeks prior he saw a little blood in his urine similar to when he had his kidney stone.  He saw his urologist yesterday who gave him a prescription for Levaquin.  He otherwise denied feeling unwell previous to the episode.  He denied chest pain, shortness of breath, headache or visual disturbance, one-sided numbness weakness or tingling, nausea or vomiting, abdominal pain, urinary frequency or burning or diarrhea.[facility] reported BP 76/45 and he was treated with IM epinephrine and a 400 mL fluid bolus.  He was awake and alert x4 by arrival.  After his arrival in the emergency room he developed abdominal pain and vomiting.   Initial CT abdomen pelvis with moderate acute pancreatitis no evidence of pancreatic necrosis or pseudocyst.  Follow-up MRI abdomen concerning for possible choledocholithiasis with evidence of necrotizing pancreatitis.  Case discussed with GI Dr. Marius Ditch at [facility].   There was mention of possible portal vein thrombosis.  Heparin GTT was initially started however patient developed suspected acute hematuria so heparin GTT was stopped.  VQ scan ordered and perfusion component will be completed prior to patient transfer.  6/15: Clinical status overall unchanged.  Patient remains with abdominal pain associated with nausea.  Hemodynamically  stable.  Mild elevation in white count.  Lipase and LFTs have improved over interval.  Discussed case with GI and general surgery.  Likely that patient's presentation is secondary to necrotizing pancreatitis.  Currently as there is no CBD stone and there is no strong indication for transfer for ERCP.  From a surgical standpoint no intervention is recommended.  There is nothing from an interventional radiology standpoint to drain.  As such we will discontinue transfer and keep patient in [facility].   6/16: No appreciable change in clinical status.  LFTs are downtrending.  White count slightly downtrending.  No fevers noted over interval.  Increasing abdominal distention noted.  Ascites and peripancreatic fluid seen on repeat CAT scan today.  Discussed case with PCCM, nephrology, GI.  Will attempt postpyloric feedings.  Kidney function continues to deteriorate.  Nephrology will initiate hemodialysis today.   6/17: No change in clinical status.  White count remains minimally elevated.  No fevers over interval.  Remains with abdominal distention.  Started HD yesterday.  We will run another round of HD today.   6/18: No appreciable clinical changes.  White blood cell count slightly downtrending.  Creatinine worsened despite 2 rounds of hemodialysis.  Remains with hematuria despite stopping heparin 2 days ago.   6/19: Increasing abd distention.  KUB consistent with ileus.  [place] negative for ascites   6/20: Postpyloric [place] discontinued.  NGT placed.  Tip in stomach. Set to low intermittent suction.  Rectal tube in place.  Abdominal distention improved.  White blood cell count increased to 22  For the week of 6/21 through 06/22/2022:  the patient had an upper GI bleed and was transfused 3 units of packed red blood cells.  He still remains anemic.  EGD showing gastric ulcers.  Patient was initially on Protonix drip now on Protonix twice a day.  Last hemoglobin 7.3.  CRRT was started on 06/17/2022 and continuing  as per nephrology.  The patient is still anuric.  The patient severe necrotizing pancreatitis.  Repeat CT scan showed increased nodularity in the omentum.  No discrete abscess about the pancreas.  Did see possible liver abscess.  Antibiotics were changed over to meropenem and Diflucan.  Repeat blood cultures on 06/18/2022 are negative.  The patient did have an ileus on x-ray so tube feeds were stopped.  Patient is on TPN and has a PICC line.  The patient's mental status has waxed and waned during the week and CT scan of the head was negative.  6/28: Patient continued to experience abdominal pain.  Nephrology is planning to continue CRRT for now.  Continue to have NG tube but secretions are decreasing.  Remained on TPN   Assessment and Plan: * Acute necrotizing pancreatitis MRCP revealed severe acute necrotizing pancreatitis with portal vein thrombosis.  With low-grade fever on 6/23 and I repeated blood cultures which are negative for 3 days.  Repeat CT scan does not show any abscess on the pancreas but does show fluid collection still in the liver which could be an abscess.  Empirically on meropenem and Diflucan.  Tumor markers ordered including CEA normal range at 0.8 and normal alpha-fetoprotein at less than 1.8.  CA 19-9 within normal range currently on TPN.  Tube feeding held  with ileus.  Hopefully can restart tube feeding soon..  Patient is critically ill.  Appreciate palliative care consultation.  Acute blood loss anemia Last hemoglobin 7.3.  Patient received 3 units of packed red blood cells during the hospital course.  Had GI bleed on 06/16/2022.  Having some blood loss with the continuous dialysis if the filter gets clotted and unable to return blood.   Upper GI bleed Secondary to duodenal ulcers.  No active bleeding on EGD.  Monitor closely with heparin with dialysis.  On Protonix IV twice daily  Severe sepsis ([place]) Present on admission secondary to severe necrotizing pancreatitis.   Empirically on meropenem and Diflucan.  Repeat blood cultures negative.  Acute metabolic encephalopathy Mental status waxes and wanes..  Will give as needed low-dose Seroquel at night. Need to continue pain medications.  AKI (acute kidney injury) [facility]) Patient started on CRRT on 06/17/2022.  Continue CRRT.  Critical care team took out the catheter of the neck and put a catheter in the right groin for better CRRT on 6/24.  Patient still anuric.  Portal vein thrombosis Unable to do anticoagulation with drop in hemoglobin and upper GI bleed.  Syncope Secondary to hypotension   Nephrolithiasis Nonobstructing right renal stone.  No acute issues suspected  History of prostate cancer No acute issues  BPH (benign prostatic hyperplasia) No acute issues  Diabetes mellitus without complication (HCC) Sugars elevated while on TPN.  On low-dose Semglee insulin at night.  Can probably get rid of Semglee insulin once TPN discontinued.     Subjective: Patient continued to have abdominal pain.  No nausea or vomiting.  Wife at bedside.  Remained on TPN and CRRT.  Physical Exam: Vitals:   06/23/22 1559 06/23/22 1600 06/23/22 1700 06/23/22 1800  BP:  122/65 133/70 132/79  Pulse:  79 77 80 78  Resp: (!) 24 (!) '23 18 17  '$ Temp: 97.8 F (36.6 C)     TempSrc: Axillary     SpO2: 93% 93% 93% 93%  Weight:      Height:       General.  Ill-appearing gentleman, in no acute distress.  NG tube in place Pulmonary.  Lungs clear bilaterally, normal respiratory effort. CV.  Regular rate and rhythm, no JVD, rub or murmur. Abdomen.  Soft, generalized abdominal tenderness, nondistended, BS positive. CNS.  Alert and oriented .  No focal neurologic deficit. Extremities.  No edema, no cyanosis, pulses intact and symmetrical. Psychiatry.  Judgment and insight appears normal.  Data Reviewed: Prior data reviewed  Family Communication: Discussed with wife at bedside  Disposition: Status is:  Inpatient Remains inpatient appropriate because: Severity of illness   Planned Discharge Destination: To be determined  Time spent: 55 minutes  This record has been created using Systems analyst. Errors have been sought and corrected,but may not always be located. Such creation errors do not reflect on the standard of care.  Author: Lorella Nimrod, MD 06/23/2022 6:53 PM  For on call review [email or web address].

## 2022-06-23 NOTE — Progress Notes (Signed)
NAME:  Xavier Esparza, MRN:  [id number], DOB:  [birth year], LOS: 20 ADMISSION DATE:  06/08/2022, CONSULTATION DATE:  19 June 2022 REFERRING MD:  Loletha Grayer, MD CHIEF COMPLAINT:  Severe pancreatitis, AKI, on CRRT   History of Present Illness:  Patient is a 76 year old who presented to [facility] on 08 June 2022 with severe pancreatitis.  Initial presentation was following a syncopal episode at home.  Patient initially had some hypotension which responded to fluids and modest lactic acidosis which responded to fluid resuscitation.  He has not required pressors during his admission.  Initially noted to be mentating well and then subsequently developing toxic metabolic encephalopathy and needed transfer to the ICU/stepdown area.  He was evaluated by PCCM on 15 June when he was transferred to the stepdown area.  The patient still remained hemodynamically stable and without respiratory distress.  On the 16th June he required dialysis catheter placement due to worsening renal failure and need for hemodialysis.  On 19 June PCCM signed off as though the patient was acutely ill with pancreatitis, there were no critical care issues pending.  Patient was receiving intermittent hemodialysis at that time listed as a stepdown status.  Because of requirement of fluid boluses during hemodialysis the patient was switched to CRRT on the evening of 22 June.  This was an automatic change in status from stepdown to ICU.  The patient continues to remain hemodynamically stable and not requiring ventilatory support at present.  We are asked to reconsult on the patient to continue to assist with any potential critical care issues that may develop.  Pertinent  Medical History  Diabetes mellitus Dyslipidemia Prostate cancer Nephrolithiasis  Significant Hospital Events: Including procedures, antibiotic start and stop dates in addition to other pertinent events   ANTIBIOTICS 06/08/2022 Vancomycin >>06/08/2022 06/08/2022 Flagyl  >>06/09/2022 06/08/2022 Cefepime>>06/09/2022 06/09/2022 PipTazo>>06/17/2022 06/18/2022 Meropenem>> 06/19/2022 Fluconazole>>  EVENTS 06/10/2022 Transferred to SDU 06/11/2022 dialysis catheter placement, hemodialysis initiated 06/17/2022 switch to CRRT, patient changed to ICU status 06/18/2022 EGD done, shows gastric/duodenal ulcers, on PPI twice daily 06/19/2022 PCCM reconsulted, recommendations discussed with hospitalist team, new dialysis catheter placed 06/20/2022 marked improvement in mental status, new HD catheter yesterday, addition of fluconazole 06/22/2022 no acute events overnight, remains on CRRT not requiring vasopressors  06/22/2022 Palliative Care consulted pt to remain Full Code per family request 06/23/2022 No acute events overnight remains on CRRT   Interim History / Subjective:  As outlined above   Objective   Blood pressure 108/69, pulse 80, temperature 98.1 F (36.7 C), temperature source Oral, resp. rate (!) 24, height 6' (1.829 m), weight 94.3 kg, SpO2 97 %.        Intake/Output Summary (Last 24 hours) at 06/23/2022 0754 Last data filed at 06/23/2022 0700 Gross per 24 hour  Intake 3418.73 ml  Output 3417 ml  Net 1.73 ml   Filed Weights   06/21/22 0412 06/22/22 0500 06/23/22 0418  Weight: 97.1 kg 96.2 kg 94.3 kg   Examination: General: Acutely ill-appearing male, NAD resting in bed  HEENT: No scleral edema, supple, no JVD  Lungs: Diminished throughout, even, non labored  Cardiovascular: NSR, rrr, no M/R/G, 2+ radial/1+ distal pulses, trace generalized edema  Abdomen: Distended, hypoactive bowel sounds x4, non tender  Extremities: No cyanosis, clubbing or edema. Neuro: Lethargic disoriented to place, follows commands, PERRLA GU: Foley in place draining clear urine  Resolved Hospital Problem list   N/A  Assessment & Plan:   Severe necrotizing pancreatitis Apparent new nodularity on pancreas and  omentum Unclear etiology query due to Crestor  (rosuvastatin) Supportive care Tumor markers >>CA 19-9~24 Continue TPN Continue bowel rest for now  Continue supportive care  Multiple liver abscesses/micro abscesses Cluster of low-attenuation lesion RIGHT hepatic lobe infection versus malignancy Continue meropenem and fluconazole  Tumor markers >> CA19-9~24 ID consulted appreciate input   Toxic metabolic encephalopathy - acute - IMPROVING Ammonia level normal Limit sedatives as much as able Correct metabolic abnormalities as able Address infectious processes Reorientation techniques Maintain wake/sleep cycle  Acute renal failure Nephrology consulted appreciate input: CRRT/HD per recommendations  Avoid nephrotoxins Dose medications according to CRRT New dialysis catheter 6/24  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 Malignancy cannot be excluded Cannot be placed on anticoagulants currently due to gastric/duodenal ulcers  Gastric/duodenal ulcers PPI twice daily No full dose anticoagulation for now due to anemia   Acute blood loss anemia Anemia of critical illness Transfuse for HgB < 7.0 Monitor for signs of bleeding  Best Practice (right click and "Reselect all SmartList Selections" daily)   Diet/type: TPN DVT prophylaxis: other Heparin through CRRT circuit GI prophylaxis: PPI Lines: Dialysis Catheter new/24 Foley:  Yes, and it is still needed Code Status:  full code Last date of multidisciplinary goals of care discussion [06/23/2022]  Labs   CBC: Recent Labs  Lab 06/19/22 0216 06/19/22 1433 06/20/22 0352 06/21/22 0414 06/22/22 0357 06/22/22 0458 06/22/22 2228 06/23/22 0408  WBC 24.5*  --  27.5* 26.0* 20.2* 23.8*  --  23.9*  NEUTROABS 21.7*  --  24.5* 21.4*  --  19.5*  --  19.5*  HGB 8.5*   < > 8.7* 8.0* 7.3* 7.3* 6.9* 8.2*  HCT 26.4*   < > 27.0* 24.0* 22.6* 22.6* 21.7* 24.5*  MCV 83.3  --  85.2 84.5 96.2 85.6  --  85.7  PLT 237  --  189 209 195 203  --  199   < > = values in this  interval not displayed.    Basic Metabolic Panel: Recent Labs  Lab 06/20/22 0352 06/20/22 1558 06/21/22 0414 06/21/22 1555 06/22/22 0357 06/22/22 0458 06/22/22 1550 06/23/22 0408  NA 140 136 136  136 137  --  139 139 139  K 4.1 3.9 3.5  3.5 4.0  --  3.8 4.0 4.0  CL 109 105 103  103 104  --  108 106 109  CO2 '24 25 26  24 25  '$ --  '26 25 24  '$ GLUCOSE 219* 201* 204*  203* 187*  --  175* 155* 192*  BUN 64* 58* 56*  57* 55*  --  52* 53* 53*  CREATININE 4.41* 3.90* 3.59*  2.84* 3.34*  --  3.01* 3.02* 2.99*  CALCIUM 8.6* 8.9 9.0  9.0 8.2*  --  8.1* 8.2* 8.2*  MG 2.1  --  2.1  --  2.3 2.3  --  2.4  PHOS 3.6 2.7 2.6  2.6 2.0*  --   --  1.8* 2.0*   GFR: Estimated Creatinine Clearance: 24.7 mL/min (A) (by C-G formula based on SCr of 2.99 mg/dL (H)). Recent Labs  Lab 06/19/22 1500 06/20/22 0352 06/21/22 0414 06/22/22 0357 06/22/22 0458 06/23/22 0408  WBC  --    < > 26.0* 20.2* 23.8* 23.9*  LATICACIDVEN 1.3  --   --   --   --   --    < > = values in this interval not displayed.    Liver Function Tests: Recent Labs  Lab 06/17/22 1015 06/17/22 1530 06/19/22 1539  06/20/22 0352 06/20/22 1558 06/21/22 0414 06/21/22 1555 06/22/22 1550 06/23/22 0408  AST 86*  --  41  --   --  45*  --   --   --   ALT 58*  --  32  --   --  29  --   --   --   ALKPHOS 77  --  89  --   --  112  --   --   --   BILITOT 1.1  --  0.6  --   --  0.4  --   --   --   PROT 6.0*  --  5.3*  --   --  5.7*  --   --   --   ALBUMIN 2.4*   < > 2.0*   < > 2.1* 2.0*  2.1* 2.2* 2.1* 1.8*   < > = values in this interval not displayed.   No results for input(s): "LIPASE", "AMYLASE" in the last 168 hours. Recent Labs  Lab 06/19/22 1500  AMMONIA 20    ABG    Component Value Date/Time   PHART 7.3 (L) 06/19/2022 2130   PCO2ART 48 06/19/2022 2130   PO2ART 109 (H) 06/19/2022 2130   HCO3 23.6 06/19/2022 2130   ACIDBASEDEF 3.2 (H) 06/19/2022 2130   O2SAT 98.3 06/19/2022 2130     Coagulation  Profile: No results for input(s): "INR", "PROTIME" in the last 168 hours.  Cardiac Enzymes: No results for input(s): "CKTOTAL", "CKMB", "CKMBINDEX", "TROPONINI" in the last 168 hours.  HbA1C: Hgb A1c MFr Bld  Date/Time Value Ref Range Status  01/08/2022 10:34 AM 6.2 4.6 - 6.5 % Final    Comment:    Glycemic Control Guidelines for People with Diabetes:Non Diabetic:  <6%Goal of Therapy: <7%Additional Action Suggested:  >8%   07/02/2021 08:46 AM 6.0 4.6 - 6.5 % Final    Comment:    Glycemic Control Guidelines for People with Diabetes:Non Diabetic:  <6%Goal of Therapy: <7%Additional Action Suggested:  >8%     CBG: Recent Labs  Lab 06/22/22 1122 06/22/22 1622 06/22/22 1932 06/22/22 2334 06/23/22 0404  GLUCAP 161* 166* 154* 181* 199*    Review of Systems: Positives in BOLD   Gen: Denies fever, chills, weight change, fatigue, night sweats HEENT: Denies blurred vision, double vision, hearing loss, tinnitus, sinus congestion, rhinorrhea, sore throat, neck stiffness, dysphagia PULM: Denies shortness of breath, cough, sputum production, hemoptysis, wheezing CV: Denies chest pain, edema, orthopnea, paroxysmal nocturnal dyspnea, palpitations GI: abdominal pain, nausea, vomiting, diarrhea, hematochezia, melena, constipation, change in bowel habits GU: Denies dysuria, hematuria, polyuria, oliguria, urethral discharge Endocrine: Denies hot or cold intolerance, polyuria, polyphagia or appetite change Derm: Denies rash, dry skin, scaling or peeling skin change Heme: Denies easy bruising, bleeding, bleeding gums Neuro: Denies headache, numbness, weakness, slurred speech, loss of memory or consciousness   Allergies No Known Allergies   Home Medications  Prior to Admission medications   Medication Sig Start Date End Date Taking? Authorizing Provider  acyclovir (ZOVIRAX) 800 MG tablet TAKE 1 TABLET DAILY 01/25/22  Yes Tonia Ghent, MD  aspirin EC 81 MG tablet Take 81 mg by mouth daily.  Swallow whole.   Yes [provider]  Calcium Carbonate (CALCIUM 600 PO) Take 2 tablets by mouth daily.   Yes [provider]  GLUCOSAMINE HCL PO Take 1 tablet by mouth daily.    Yes [provider]  losartan (COZAAR) 25 MG tablet Take 1 tablet (25 mg total) by mouth  daily. 12/29/21  Yes Tower, Wynelle Fanny, MD  Magnesium 500 MG TABS Take 1 tablet by mouth daily.   Yes [provider]  metFORMIN (GLUCOPHAGE) 500 MG tablet TAKE 1 TABLET DAILY 01/25/22  Yes Tonia Ghent, MD  Multiple Vitamin (MULTIVITAMIN) tablet Take 1 tablet by mouth daily. Reported on 01/07/2016   Yes [provider]  Omega-3 Fatty Acids ([employer] FISH OIL PO) Take 1,000 mg by mouth daily.   Yes [provider]  Red Yeast Rice 600 MG TABS Take 2 tablets (1,200 mg total) by mouth daily. 01/08/22  Yes Tonia Ghent, MD  rosuvastatin (CRESTOR) 20 MG tablet TAKE 1 TABLET EVERY OTHER  DAY IN THE MORNING. NEEDS TO CALL OFFICE TO SCHEDULE YEARLY EXAM 04/12/22  Yes Tonia Ghent, MD  Testosterone 1.62 % GEL Apply 2 Pump topically daily. 05/12/22  Yes [provider]    Hospital medications Scheduled Meds:  Chlorhexidine Gluconate Cloth  6 each Topical Q0600   free water  100 mL Per Tube Q8H   insulin aspart  0-20 Units Subcutaneous Q4H   insulin glargine-yfgn  8 Units Subcutaneous QHS   multivitamin  1 tablet Per Tube QHS   mouth rinse  15 mL Mouth Rinse 4 times per day   pantoprazole  40 mg Intravenous Q12H   sodium chloride flush  10-40 mL Intracatheter Q12H   Continuous Infusions:   prismasol BGK 4/2.5 300 mL/hr at 06/22/22 1625    prismasol BGK 4/2.5 300 mL/hr at 06/22/22 1625   sodium chloride Stopped (06/19/22 2153)   fluconazole (DIFLUCAN) IV Stopped (06/22/22 2205)   heparin 10,000 units/ 20 mL infusion syringe 250 Units/hr (06/23/22 0700)   meropenem (MERREM) IV Stopped (06/23/22 0542)   prismasol BGK 4/2.5 1,500 mL/hr at 06/23/22 0130   TPN ADULT (ION) 90 mL/hr  at 06/23/22 0700   PRN Meds:.sodium chloride, acetaminophen **OR** acetaminophen, alteplase, guaiFENesin-dextromethorphan, haloperidol lactate, heparin, HYDROmorphone (DILAUDID) injection, hydrOXYzine, ipratropium-albuterol, lidocaine (PF), lidocaine-prilocaine, lip balm, mouth rinse, mouth rinse, oxyCODONE, pentafluoroprop-tetrafluoroeth, QUEtiapine, sodium chloride, sodium chloride flush   Critical care time: 35 minutes    Donell Beers, [place] Pager [phone] (please enter 7 digits) PCCM Consult Pager [phone] (please enter 7 digits)

## 2022-06-23 NOTE — Progress Notes (Signed)
PHARMACY - TOTAL PARENTERAL NUTRITION CONSULT NOTE   Indication: without adequate nutrition for > 7 days  Patient Measurements: Height: 6' (182.9 cm) Weight: 94.3 kg (207 lb 14.3 oz) IBW/kg (Calculated) : 77.6 TPN AdjBW (KG): 83.7 Body mass index is 28.2 kg/m.  Assessment: 76 y/o male with h/o DM, BPH, prostate cancer, hiatal hernia, kidney stones and HTN who is admitted with severe necrotizing pancreatitis, portal vein thrombosis, liver lesions, AKI and sepsis.  Glucose / Insulin: BG 155 - 192 (23 units SSI required) Electrolytes: hypophosphatemia (on CRRT), other electrolytes wnl Renal: CRRT Hepatic: LFTs elevated, trending down Intake / Output; MIVF: no MIVF --I/O net even GI Imaging: 06/21/22 Abd Xray shows Diffuse gaseous distention of bowel compatible with ileus GI Surgeries / Procedures: none recent  Central access: 06/17/22  TPN start date: 06/17/22   Nutritional Goals: Goal TPN rate is 90 mL/hr (provides 149 g of protein and 2499 kcals per day)  RD Assessment: Estimated Needs Total Energy Estimated Needs: 2350-2550 Total Protein Estimated Needs: 130-155 grams Total Fluid Estimated Needs: > 2 L  Current Nutrition:  NPO  Plan:  continue TPN at 58m/hr Nutritional components: Amino acids (using 15% Clinisol): 149 grams Dextrose: 339 grams Lipids (using 20% SMOFlipids) 75 grams kCal 2499  Electrolytes in TPN: Na 533m/L, K 2080mL, Ca 5mE40m, Mg 1mEq77m and Phos 1 mmol/L. Cl:Ac 1:1 Add standard MVI and trace elements to TPN continue moderate q4h SSI and adjust as needed  Continue insulin glargine 8 units hs Monitor TPN labs on Mon/Thurs, daily until stable  RodneDallie PilesrmD, BCPS 06/23/2022,7:06 AM

## 2022-06-24 ENCOUNTER — Inpatient Hospital Stay: Payer: Medicare Other

## 2022-06-24 DIAGNOSIS — K859 Acute pancreatitis without necrosis or infection, unspecified: Secondary | ICD-10-CM | POA: Diagnosis not present

## 2022-06-24 DIAGNOSIS — K8591 Acute pancreatitis with uninfected necrosis, unspecified: Secondary | ICD-10-CM | POA: Diagnosis not present

## 2022-06-24 DIAGNOSIS — I81 Portal vein thrombosis: Secondary | ICD-10-CM | POA: Diagnosis not present

## 2022-06-24 DIAGNOSIS — G9341 Metabolic encephalopathy: Secondary | ICD-10-CM | POA: Diagnosis not present

## 2022-06-24 DIAGNOSIS — N179 Acute kidney failure, unspecified: Secondary | ICD-10-CM | POA: Diagnosis not present

## 2022-06-24 DIAGNOSIS — N400 Enlarged prostate without lower urinary tract symptoms: Secondary | ICD-10-CM | POA: Diagnosis not present

## 2022-06-24 LAB — COMPREHENSIVE METABOLIC PANEL
ALT: 18 U/L (ref 0–44)
AST: 38 U/L (ref 15–41)
Albumin: 2.3 g/dL — ABNORMAL LOW (ref 3.5–5.0)
Alkaline Phosphatase: 98 U/L (ref 38–126)
Anion gap: 4 — ABNORMAL LOW (ref 5–15)
BUN: 51 mg/dL — ABNORMAL HIGH (ref 8–23)
CO2: 27 mmol/L (ref 22–32)
Calcium: 8.2 mg/dL — ABNORMAL LOW (ref 8.9–10.3)
Chloride: 107 mmol/L (ref 98–111)
Creatinine, Ser: 2.62 mg/dL — ABNORMAL HIGH (ref 0.61–1.24)
GFR, Estimated: 24 mL/min — ABNORMAL LOW (ref >60)
Glucose, Bld: 189 mg/dL — ABNORMAL HIGH (ref 70–99)
Potassium: 4.2 mmol/L (ref 3.5–5.1)
Sodium: 138 mmol/L (ref 135–145)
Total Bilirubin: 0.4 mg/dL (ref 0.3–1.2)
Total Protein: 5.9 g/dL — ABNORMAL LOW (ref 6.5–8.1)

## 2022-06-24 LAB — CBC WITH DIFFERENTIAL/PLATELET
Abs Immature Granulocytes: 1.17 10*3/uL — ABNORMAL HIGH (ref 0.00–0.07)
Basophils Absolute: 0.1 10*3/uL (ref 0.0–0.1)
Basophils Relative: 0 %
Eosinophils Absolute: 0.1 10*3/uL (ref 0.0–0.5)
Eosinophils Relative: 0 %
HCT: 23.4 % — ABNORMAL LOW (ref 39.0–52.0)
Hemoglobin: 7.6 g/dL — ABNORMAL LOW (ref 13.0–17.0)
Immature Granulocytes: 5 %
Lymphocytes Relative: 3 %
Lymphs Abs: 0.8 10*3/uL (ref 0.7–4.0)
MCH: 27.7 pg (ref 26.0–34.0)
MCHC: 32.5 g/dL (ref 30.0–36.0)
MCV: 85.4 fL (ref 80.0–100.0)
Monocytes Absolute: 2.2 10*3/uL — ABNORMAL HIGH (ref 0.1–1.0)
Monocytes Relative: 9 %
Neutro Abs: 21.4 10*3/uL — ABNORMAL HIGH (ref 1.7–7.7)
Neutrophils Relative %: 83 %
Platelets: 175 10*3/uL (ref 150–400)
RBC: 2.74 MIL/uL — ABNORMAL LOW (ref 4.22–5.81)
RDW: 17.1 % — ABNORMAL HIGH (ref 11.5–15.5)
Smear Review: NORMAL
WBC: 25.8 10*3/uL — ABNORMAL HIGH (ref 4.0–10.5)
nRBC: 0.2 % (ref 0.0–0.2)

## 2022-06-24 LAB — FOLATE: Folate: 10.6 ng/mL (ref >5.9)

## 2022-06-24 LAB — GLUCOSE, CAPILLARY
Glucose-Capillary: 174 mg/dL — ABNORMAL HIGH (ref 70–99)
Glucose-Capillary: 187 mg/dL — ABNORMAL HIGH (ref 70–99)
Glucose-Capillary: 202 mg/dL — ABNORMAL HIGH (ref 70–99)
Glucose-Capillary: 153 mg/dL — ABNORMAL HIGH (ref 70–99)
Glucose-Capillary: 191 mg/dL — ABNORMAL HIGH (ref 70–99)
Glucose-Capillary: 199 mg/dL — ABNORMAL HIGH (ref 70–99)
Glucose-Capillary: 149 mg/dL — ABNORMAL HIGH (ref 70–99)
Glucose-Capillary: 152 mg/dL — ABNORMAL HIGH (ref 70–99)
Glucose-Capillary: 170 mg/dL — ABNORMAL HIGH (ref 70–99)

## 2022-06-24 LAB — RETICULOCYTES
Immature Retic Fract: 39.8 % — ABNORMAL HIGH (ref 2.3–15.9)
RBC.: 2.72 MIL/uL — ABNORMAL LOW (ref 4.22–5.81)
Retic Count, Absolute: 29.4 10*3/uL (ref 19.0–186.0)
Retic Ct Pct: 1.1 % (ref 0.4–3.1)

## 2022-06-24 LAB — RENAL FUNCTION PANEL
Albumin: 2.3 g/dL — ABNORMAL LOW (ref 3.5–5.0)
Anion gap: 12 (ref 5–15)
BUN: 48 mg/dL — ABNORMAL HIGH (ref 8–23)
CO2: 25 mmol/L (ref 22–32)
Calcium: 8.1 mg/dL — ABNORMAL LOW (ref 8.9–10.3)
Chloride: 99 mmol/L (ref 98–111)
Creatinine, Ser: 3.2 mg/dL — ABNORMAL HIGH (ref 0.61–1.24)
GFR, Estimated: 19 mL/min — ABNORMAL LOW (ref >60)
Glucose, Bld: 160 mg/dL — ABNORMAL HIGH (ref 70–99)
Phosphorus: 4.1 mg/dL (ref 2.5–4.6)
Potassium: 3.8 mmol/L (ref 3.5–5.1)
Sodium: 136 mmol/L (ref 135–145)

## 2022-06-24 LAB — BLOOD GAS, ARTERIAL
Acid-Base Excess: 6.1 mmol/L — ABNORMAL HIGH (ref 0.0–2.0)
Bicarbonate: 29.6 mmol/L — ABNORMAL HIGH (ref 20.0–28.0)
O2 Content: 2 L/min
O2 Saturation: 98.2 %
Patient temperature: 37
pCO2 arterial: 38 mmHg (ref 32–48)
pH, Arterial: 7.5 — ABNORMAL HIGH (ref 7.35–7.45)
pO2, Arterial: 89 mmHg (ref 83–108)

## 2022-06-24 LAB — PREPARE RBC (CROSSMATCH)

## 2022-06-24 LAB — VITAMIN B12: Vitamin B-12: 1907 pg/mL — ABNORMAL HIGH (ref 180–914)

## 2022-06-24 LAB — PHOSPHORUS
Phosphorus: 1.4 mg/dL — ABNORMAL LOW (ref 2.5–4.6)
Phosphorus: 2.7 mg/dL (ref 2.5–4.6)
Phosphorus: 4.1 mg/dL (ref 2.5–4.6)

## 2022-06-24 LAB — FERRITIN: Ferritin: 1496 ng/mL — ABNORMAL HIGH (ref 24–336)

## 2022-06-24 LAB — IRON AND TIBC
Iron: 10 ug/dL — ABNORMAL LOW (ref 45–182)
Saturation Ratios: 10 % — ABNORMAL LOW (ref 17.9–39.5)
TIBC: 104 ug/dL — ABNORMAL LOW (ref 250–450)
UIBC: 94 ug/dL

## 2022-06-24 LAB — MAGNESIUM: Magnesium: 2.4 mg/dL (ref 1.7–2.4)

## 2022-06-24 LAB — TRIGLYCERIDES: Triglycerides: 203 mg/dL — ABNORMAL HIGH (ref <150)

## 2022-06-24 MED ORDER — HEPARIN SODIUM (PORCINE) 5000 UNIT/ML IJ SOLN
5000.0000 [IU] | Freq: Three times a day (TID) | INTRAMUSCULAR | Status: DC
  Administered 2022-06-24 – 2022-06-29 (×15): 5000 [IU] via SUBCUTANEOUS
  Filled 2022-06-24 (×16): qty 1

## 2022-06-24 MED ORDER — FREE WATER
20.0000 mL | Status: DC
  Administered 2022-06-24 – 2022-06-26 (×11): 20 mL

## 2022-06-24 MED ORDER — FLUCONAZOLE IN SODIUM CHLORIDE 400-0.9 MG/200ML-% IV SOLN
400.0000 mg | INTRAVENOUS | Status: AC
  Administered 2022-06-24 – 2022-06-25 (×2): 400 mg via INTRAVENOUS
  Filled 2022-06-24 (×2): qty 200

## 2022-06-24 MED ORDER — SODIUM PHOSPHATES 45 MMOLE/15ML IV SOLN
45.0000 mmol | Freq: Once | INTRAVENOUS | Status: AC
  Administered 2022-06-24: 45 mmol via INTRAVENOUS
  Filled 2022-06-24: qty 15

## 2022-06-24 MED ORDER — SODIUM CHLORIDE 0.9 % IV SOLN
500.0000 mg | Freq: Every day | INTRAVENOUS | Status: DC
  Administered 2022-06-24 – 2022-06-28 (×5): 500 mg via INTRAVENOUS
  Filled 2022-06-24: qty 500
  Filled 2022-06-24 (×5): qty 10

## 2022-06-24 MED ORDER — HEPARIN SODIUM (PORCINE) 1000 UNIT/ML DIALYSIS
1000.0000 [IU] | INTRAMUSCULAR | Status: DC | PRN
Start: 2022-06-24 — End: 2022-06-29
  Administered 2022-06-24: 3600 [IU] via INTRAVENOUS_CENTRAL
  Administered 2022-06-28 – 2022-06-29 (×2): 1000 [IU] via INTRAVENOUS_CENTRAL

## 2022-06-24 MED ORDER — TRACE MINERALS CU-MN-SE-ZN 300-55-60-3000 MCG/ML IV SOLN
INTRAVENOUS | Status: AC
  Filled 2022-06-24: qty 993.6

## 2022-06-24 MED ORDER — HEPARIN SODIUM (PORCINE) 1000 UNIT/ML IJ SOLN
INTRAMUSCULAR | Status: AC
  Administered 2022-06-24: 2000 [IU] via INTRAVENOUS_CENTRAL
  Filled 2022-06-24: qty 10

## 2022-06-24 MED ORDER — INSULIN ASPART 100 UNIT/ML IJ SOLN
0.0000 [IU] | INTRAMUSCULAR | Status: DC
  Administered 2022-06-24 – 2022-06-25 (×4): 2 [IU] via SUBCUTANEOUS
  Filled 2022-06-24 (×4): qty 1

## 2022-06-24 MED ORDER — VITAL 1.5 CAL PO LIQD
1000.0000 mL | ORAL | Status: DC
  Administered 2022-06-24: 1000 mL

## 2022-06-24 NOTE — Progress Notes (Signed)
[facility]  ROUNDING NOTE   Subjective:   Patient seen at bedside in ICU No family at bedside Alert and oriented, confusion remains at times TPN at 56m/hr NGT in place CRRT stopped due to clotted dialyzer  Remains anuric  Objective:  Vital signs in last 24 hours:  Temp:  [97.6 F (36.4 C)-99.4 F (37.4 C)] 99 F (37.2 C) (06/29 0716) Pulse Rate:  [70-99] 87 (06/29 0800) Resp:  [14-30] 27 (06/29 0800) BP: (112-136)/(57-107) 131/79 (06/29 0800) SpO2:  [82 %-98 %] 94 % (06/29 0800) Weight:  [96.6 kg] 96.6 kg (06/29 0500)  Weight change: 2.3 kg Filed Weights   06/22/22 0500 06/23/22 0418 06/24/22 0500  Weight: 96.2 kg 94.3 kg 96.6 kg    Intake/Output: I/O last 3 completed shifts: In: 5225.5 [I.V.:3341.5; Blood:370.8; Other:10; NG/GT:115; IV Piggyback:1388.2] Out: 41448[Emesis/NG output:50; Other:4585]   Intake/Output this shift:  Total I/O In: 5 [I.V.:5] Out: 0   Physical Exam: General: NAD  Head: Normocephalic, atraumatic. Dry oral mucosal membranes  Eyes: Anicteric  Lungs:  Diminished in bases, normal effort, [place] O2  Heart: Regular rate and rhythm  Abdomen:  Firm, distended, mild tenderness  Extremities: Trace peripheral edema.  Neurologic: Alert, simple responses  Skin: Warm, dry  Access: Femoral  dialysis catheter 06/19/2022.      Basic Metabolic Panel: Recent Labs  Lab 06/21/22 0414 06/21/22 1555 06/22/22 0357 06/22/22 0458 06/22/22 1550 06/23/22 0408 06/23/22 1606 06/24/22 0613  NA 136  136 137  --  139 139 139 138 138  K 3.5  3.5 4.0  --  3.8 4.0 4.0 3.9 4.2  CL 103  103 104  --  108 106 109 107 107  CO2 '26  24 25  '$ --  '26 25 24 25 27  '$ GLUCOSE 204*  203* 187*  --  175* 155* 192* 167* 189*  BUN 56*  57* 55*  --  52* 53* 53* 50* 51*  CREATININE 3.59*  2.84* 3.34*  --  3.01* 3.02* 2.99* 2.62* 2.62*  CALCIUM 9.0  9.0 8.2*  --  8.1* 8.2* 8.2* 8.1* 8.2*  MG 2.1  --  2.3 2.3  --  2.4  --  2.4  PHOS 2.6  2.6 2.0*  --   --   1.8* 2.0* 1.7* 1.4*     Liver Function Tests: Recent Labs  Lab 06/19/22 1539 06/20/22 0352 06/21/22 0414 06/21/22 1555 06/22/22 1550 06/23/22 0408 06/23/22 1606 06/24/22 0613  AST 41  --  45*  --   --   --   --  38  ALT 32  --  29  --   --   --   --  18  ALKPHOS 89  --  112  --   --   --   --  98  BILITOT 0.6  --  0.4  --   --   --   --  0.4  PROT 5.3*  --  5.7*  --   --   --   --  5.9*  ALBUMIN 2.0*   < > 2.0*  2.1* 2.2* 2.1* 1.8* 1.9* 2.3*   < > = values in this interval not displayed.    No results for input(s): "LIPASE", "AMYLASE" in the last 168 hours. Recent Labs  Lab 06/19/22 1500  AMMONIA 20      CBC: Recent Labs  Lab 06/20/22 0352 06/21/22 0414 06/22/22 0357 06/22/22 0458 06/22/22 2228 06/23/22 0408 06/23/22 1750 06/24/22 0613  WBC 27.5*  26.0* 20.2* 23.8*  --  23.9*  --  25.8*  NEUTROABS 24.5* 21.4*  --  19.5*  --  19.5*  --  21.4*  HGB 8.7* 8.0* 7.3* 7.3* 6.9* 8.2* 7.8* 7.6*  HCT 27.0* 24.0* 22.6* 22.6* 21.7* 24.5* 24.0* 23.4*  MCV 85.2 84.5 96.2 85.6  --  85.7  --  85.4  PLT 189 209 195 203  --  199  --  175     Cardiac Enzymes: No results for input(s): "CKTOTAL", "CKMB", "CKMBINDEX", "TROPONINI" in the last 168 hours.   BNP: Invalid input(s): "POCBNP"  CBG: Recent Labs  Lab 06/23/22 1935 06/23/22 2312 06/24/22 0441 06/24/22 0733 06/24/22 0741  GLUCAP 181* 177* 174* 187* 202*     Microbiology: Results for orders placed or performed during the hospital encounter of 06/08/22  Blood culture (routine x 2)     Status: None   Collection Time: 06/08/22  1:33 PM   Specimen: BLOOD  Result Value Ref Range Status   Specimen Description BLOOD LEFT HAND  Final   Special Requests   Final    BOTTLES DRAWN AEROBIC AND ANAEROBIC Blood Culture results may not be optimal due to an inadequate volume of blood received in culture bottles   Culture   Final    NO GROWTH 5 DAYS Performed at [facility], [address] [place],  [place] [address]    Report Status 06/13/2022 FINAL  Final  Blood culture (routine x 2)     Status: None   Collection Time: 06/08/22  2:15 PM   Specimen: BLOOD  Result Value Ref Range Status   Specimen Description BLOOD BLOOD LEFT WRIST  Final   Special Requests   Final    BOTTLES DRAWN AEROBIC AND ANAEROBIC Blood Culture results may not be optimal due to an inadequate volume of blood received in culture bottles   Culture   Final    NO GROWTH 5 DAYS Performed at [facility], [address], [place], [place] [address]    Report Status 06/13/2022 FINAL  Final  MRSA Next Gen by PCR, Nasal     Status: None   Collection Time: 06/10/22  4:14 PM   Specimen: Nasal Mucosa; Nasal Swab  Result Value Ref Range Status   MRSA by PCR Next Gen NOT DETECTED NOT DETECTED Final    Comment: (NOTE) The GeneXpert MRSA Assay (FDA approved for NASAL specimens only), is one component of a comprehensive MRSA colonization surveillance program. It is not intended to diagnose MRSA infection nor to guide or monitor treatment for MRSA infections. Test performance is not FDA approved in patients less than 72 years old. Performed at [facility], [address] [place], [place] [address]   SARS Coronavirus 2 by RT PCR (hospital order, performed in [facility] hospital lab) *cepheid single result test* Anterior Nasal Swab     Status: None   Collection Time: 06/10/22  5:17 PM   Specimen: Anterior Nasal Swab  Result Value Ref Range Status   SARS Coronavirus 2 by RT PCR NEGATIVE NEGATIVE Final    Comment: (NOTE) SARS-CoV-2 target nucleic acids are NOT DETECTED.  The SARS-CoV-2 RNA is generally detectable in upper and lower respiratory specimens during the acute phase of infection. The lowest concentration of SARS-CoV-2 viral copies this assay can detect is 250 copies / mL. A negative result does not preclude SARS-CoV-2 infection and should not be used as the sole basis for treatment or  other patient management decisions.  A negative result may occur with improper specimen collection / handling, submission of specimen other than nasopharyngeal swab, presence of viral mutation(s) within the areas targeted by this assay, and inadequate number of viral copies (<250 copies / mL). A negative result must be combined with clinical observations, patient history, and epidemiological information.  Fact Sheet for Patients:   https://www.patel.info/  Fact Sheet for Healthcare Providers: https://hall.com/  This test is not yet approved or  cleared by the [place] FDA and has been authorized for detection and/or diagnosis of SARS-CoV-2 by FDA under an Emergency Use Authorization (EUA).  This EUA will remain in effect (meaning this test can be used) for the duration of the COVID-19 declaration under Section 564(b)(1) of the Act, 21 U.S.C. section 360bbb-3(b)(1), unless the authorization is terminated or revoked sooner.  Performed at [facility], [address] [place], [place] [address]   Culture, blood (Routine X 2) w Reflex to ID Panel     Status: None   Collection Time: 06/18/22 10:03 AM   Specimen: BLOOD  Result Value Ref Range Status   Specimen Description BLOOD RIGHT HAND  Final   Special Requests   Final    BOTTLES DRAWN AEROBIC ONLY Blood Culture adequate volume   Culture   Final    NO GROWTH 5 DAYS Performed at [facility], [address] [place], [place] [address]    Report Status 06/23/2022 FINAL  Final  Culture, blood (Routine X 2) w Reflex to ID Panel     Status: None   Collection Time: 06/18/22 10:20 AM   Specimen: BLOOD  Result Value Ref Range Status   Specimen Description BLOOD LEFT [facility]  Final   Special Requests   Final    BOTTLES DRAWN AEROBIC AND ANAEROBIC Blood Culture adequate volume   Culture   Final    NO GROWTH 5 DAYS Performed at [facility], [address]  [place], [place] [address]    Report Status 06/23/2022 FINAL  Final    Coagulation Studies: No results for input(s): "LABPROT", "INR" in the last 72 hours.   Urinalysis: No results for input(s): "COLORURINE", "LABSPEC", "PHURINE", "GLUCOSEU", "HGBUR", "BILIRUBINUR", "KETONESUR", "PROTEINUR", "UROBILINOGEN", "NITRITE", "LEUKOCYTESUR" in the last 72 hours.  Invalid input(s): "APPERANCEUR"     Imaging: DG Chest Port 1 View  Result Date: 06/24/2022 CLINICAL DATA:  Abdominal distension and hypoxia. EXAM: PORTABLE CHEST 1 VIEW COMPARISON:  Chest radiograph 06/14/2022 and abdominal CT 06/18/2022 FINDINGS: Nasogastric tube extends into the abdomen. Right arm PICC line tip terminates in the SVC region. Again noted are slightly low lung volumes. Improved aeration at the right lung base compared to the previous chest radiographic examination. Few densities along the medial right lung base are suggestive for atelectasis. Otherwise, no significant airspace disease or consolidation. Heart size is normal. IMPRESSION: Residual densities at the medial right lung base probably represent atelectasis. Otherwise, no acute chest findings. Electronically Signed   By: Markus Daft M.D.   On: 06/24/2022 09:12   DG Abd 1 View  Result Date: 06/24/2022 CLINICAL DATA:  Abdominal distension and hypoxia. EXAM: ABDOMEN - 1 VIEW COMPARISON:  06/21/2022 FINDINGS: There is a right femoral dialysis catheter with the tip in the lower IVC region. Brachytherapy seeds in the prostate. Bowel gas throughout the abdomen and pelvis. There is evidence for gas in the stomach, small bowel and colon. There is a nasogastric tube which terminates in the stomach body region. Limited evaluation for free air on the supine  images. IMPRESSION: 1. Mild gaseous distension throughout the abdomen and pelvis. Findings are nonspecific but could represent an ileus pattern. 2. Right femoral dialysis catheter with the tip in the lower IVC region. Electronically  Signed   By: Markus Daft M.D.   On: 06/24/2022 09:06     Medications:     prismasol BGK 4/2.5 300 mL/hr at 06/24/22 0605    prismasol BGK 4/2.5 300 mL/hr at 06/24/22 0553   sodium chloride Stopped (06/19/22 2153)   albumin human 25 g (06/24/22 0845)   heparin 10,000 units/ 20 mL infusion syringe 250 Units/hr (06/24/22 0600)   meropenem (MERREM) IV     prismasol BGK 4/2.5 1,500 mL/hr at 06/24/22 3664   sodium phosphate 45 mmol in dextrose 5 % 250 mL infusion 45 mmol (06/24/22 1042)   TPN ADULT (ION) 90 mL/hr at 06/24/22 0700    Chlorhexidine Gluconate Cloth  6 each Topical Q0600   heparin sodium (porcine)       insulin aspart  0-20 Units Subcutaneous Q4H   insulin glargine-yfgn  8 Units Subcutaneous QHS   multivitamin  1 tablet Per Tube QHS   mouth rinse  15 mL Mouth Rinse 4 times per day   pantoprazole  40 mg Intravenous Q12H   sodium chloride flush  10-40 mL Intracatheter Q12H   sodium chloride, acetaminophen **OR** acetaminophen, alteplase, guaiFENesin-dextromethorphan, haloperidol lactate, heparin, heparin sodium (porcine), HYDROmorphone (DILAUDID) injection, hydrOXYzine, ipratropium-albuterol, lidocaine (PF), lidocaine-prilocaine, lip balm, mouth rinse, mouth rinse, oxyCODONE, pentafluoroprop-tetrafluoroeth, QUEtiapine, sodium chloride, sodium chloride flush  Assessment/ Plan:  Xavier Esparza is a 76 y.o.  male with past medical history consistent of prostate cancer, diabetes, recurrent kidney stones with lithotripsy and BPH, who was admitted to [facility] on 06/08/2022 for Portal vein thrombosis [I81] Lactic acidosis [E87.20] Necrotizing pancreatitis [K85.91] Liver mass [R16.0] Syncope, unspecified syncope type [R55] Leukocytosis, unspecified type [D72.829] Acute pancreatitis, unspecified complication status, unspecified pancreatitis type [K85.90]   Acute kidney injury with gross hematuria likely secondary to severe illness and IV contrast exposure.  Normal kidney function prior to  admission.  IV contrast exposure on 06/08/2022 and 06/09/2022.  Renal ultrasound negative for obstruction.  Urinalysis shows hematuria and mild proteinuria.   ?  Gross hematuria related to Foley catheter.  Patient has history of prostate cancer.   Received first dialysis treatment on June 16.   Patient has required CRRT starting June 22 and stopped on 06/24/22  Will attempt to perform hemodialysis on patient. Will use decreased BFR with low UF. Concerned for hypotension. Bolus order placed for heparin to prevent clotting. The concern lies with the amount of TPN and the lack of renal function. Patient may require dialysis daily. Will monitor closely.   Lab Results  Component Value Date   CREATININE 2.62 (H) 06/24/2022   CREATININE 2.62 (H) 06/23/2022   CREATININE 2.99 (H) 06/23/2022    Intake/Output Summary (Last 24 hours) at 06/24/2022 1058 Last data filed at 06/24/2022 0840 Gross per 24 hour  Intake 2887.68 ml  Output 2336 ml  Net 551.68 ml    Acute pancreatitis, severe necrotizing.  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   Supportive care with antiemetics and pain control.  Receiving Meropenem and antifungal therapy managed by primary team. Continuous TPN ordered   Diabetes mellitus type 2 without complication.Home regimen includes metformin.  Hemoglobin A1c 6.2 on 01/08/2022.  -Glucose elevated, nutrition requesting insulin be placed in TPN  Acute ileus, receiving IV narcotics for  severe acute pancreatitis. Tube feeds stopped and NGT placed to LWIS. Rectal tube placed  EGD showed 3 gastric ulcers, no active bleeding noted.       LOS: [place] 6/29/202310:58 AM

## 2022-06-24 NOTE — Inpatient Diabetes Management (Signed)
Inpatient Diabetes Program Recommendations  AACE/ADA: New Consensus Statement on Inpatient Glycemic Control   Target Ranges:  Prepandial:   less than 140 mg/dL      Peak postprandial:   less than 180 mg/dL (1-2 hours)      Critically ill patients:  140 - 180 mg/dL    Latest Reference Range & Units 06/24/22 04:41 06/24/22 07:33 06/24/22 07:41  Glucose-Capillary 70 - 99 mg/dL 174 (H) 187 (H) 202 (H)    Latest Reference Range & Units 06/23/22 08:03 06/23/22 11:55 06/23/22 16:25 06/23/22 19:35 06/23/22 23:12  Glucose-Capillary 70 - 99 mg/dL 170 (H) 168 (H) 156 (H) 181 (H) 177 (H)   Review of Glycemic Control  Diabetes history: DM2 Outpatient Diabetes medications: Metformin 500 mg daily Current orders for Inpatient glycemic control: Semglee 8 units QHS, Novolog 0-20 units Q4H; TPN @ 90 ml/hr  Inpatient Diabetes Program Recommendations:    Insulin in TPN: Please consider adding 20 units of insulin to TPN.  Thanks, Barnie Alderman, RN, MSN, [place] Diabetes Coordinator Inpatient Diabetes Program [phone] (Team Pager from 8am to [place])

## 2022-06-24 NOTE — Progress Notes (Signed)
Nutrition Follow Up Note   DOCUMENTATION CODES:   Obesity unspecified  INTERVENTION:   Continue TPN per pharmacy- provides 2499kcal/day and 149g/day protein   Vital 1.5'@65ml' /hr- initiate at 5m/hr, once tolerating, increase by 123mhr q 8 hours until goal rate of 6547mr is reached.   Pro-Source 47m58mD via tube  Free water flushes 30ml75mhours to maintain tube patency   Regimen at goal rate provides 2420kcal/day, 127g/day protein and 1371ml/53mof free water   Rena-vit daily via tube   NUTRITION DIAGNOSIS:   Inadequate oral intake related to acute illness as evidenced by NPO status.  GOAL:   Patient will meet greater than or equal to 90% of their needs -met with TPN   MONITOR:   Diet advancement, Labs, Weight trends, TF tolerance, Skin, I & O's, TPN  ASSESSMENT:   76 y/o29ale with h/o DM, BPH, prostate cancer, hiatal hernia, kidney stones and HTN who is admitted with severe necrotizing pancreatitis, portal vein thrombosis, liver lesions, AKI and sepsis.  - Pt s/p initiation of HD 6/16 -Pt s/p EGD 6/23; pt noted to have gastric and duodenal ulcers -Pt s/p TPN initiation 6/22  Pt tolerating TPN at goal rate. Triglycerides slightly elevated. Pt is refeeding; electrolytes being managed by pharmacy. Pt remains NPO. NGT in place with 100ml o51mt. Pt continues to have abdominal distension which appears improved from last NFPE. KUB from today reports mild gaseous distension throughout the abdomen and pelvis. Pt is having bowel movements. Plan is for rectal tube today. Will initiate trickle tube feeds today and assess for tolerance; will advance as tolerated. Per chart, pt appears weight stable since admission. Pt +1.7L on his I & Os. Vitamin D returned low; will provide oral supplementation once pt tolerating enteral nutrition.   Medications reviewed and include: heparin, insulin, rena-vit, protonix, albumin, miralax, diflucan, meropenem  Labs reviewed: K 4.2 wnl, BUN 51(H),  creat 2.62(H), Ca 8.2(L), P 1.4(L), Mg 2.4 wnl  Triglycerides- 203(H) Vitamin D- 26.93(L)- 6/22 Wbc- 25.8(H), Hgb 7.6(L), Hct 23.4(L) Cbgs- 153, 202, 187, 174 x 24 hrs  Diet Order:   Diet Order             Diet - low sodium heart healthy                  EDUCATION NEEDS:   Education needs have been addressed  Skin:  Skin Assessment: Reviewed RN Assessment  Last BM:  6/29- type 6  Height:   Ht Readings from Last 1 Encounters:  06/10/22 6' (1.829 m)    Weight:   Wt Readings from Last 1 Encounters:  06/24/22 97.1 kg    Ideal Body Weight:  80.9 kg  BMI:  Body mass index is 29.03 kg/m.  Estimated Nutritional Needs:   Kcal:  2200-2500kcal/day  Protein:  120-135g/day  Fluid:  2.1-2.4Lday  Xavier Esparza CKoleen Esparza, LDN Please refer to [facility] and/or RD on-call/weekend/after hours pager

## 2022-06-24 NOTE — Progress Notes (Signed)
Date of Admission:  06/08/2022   Total days of antibiotics  6/13>6/14 cefepime, flagyl and vanco 6/14>>06/17/22 - Piptazo 06/18/22 Meropenem 06/19/22- Fluconazole  ID: Xavier Esparza is a 76 y.o. male  Principal Problem:   Acute necrotizing pancreatitis Active Problems:   Diabetes mellitus without complication (HCC)   BPH (benign prostatic hyperplasia)   Nephrolithiasis   Portal vein thrombosis   AKI (acute kidney injury) ([place])   Severe sepsis (HCC)   Syncope   History of prostate cancer   Upper GI bleed   Acute blood loss anemia   Acute metabolic encephalopathy    Subjective: Pt lying in bed with eyes closed When I called his name he responded appropriately , opened his eyes, but then later he was confused  Medications:   Chlorhexidine Gluconate Cloth  6 each Topical Q0600   feeding supplement (VITAL 1.5 CAL)  1,000 mL Per Tube Q24H   free water  20 mL Per Tube Q4H   heparin injection (subcutaneous)  5,000 Units Subcutaneous Q8H   insulin aspart  0-20 Units Subcutaneous Q4H   insulin aspart  0-9 Units Subcutaneous Q4H   multivitamin  1 tablet Per Tube QHS   mouth rinse  15 mL Mouth Rinse 4 times per day   pantoprazole  40 mg Intravenous Q12H   sodium chloride flush  10-40 mL Intracatheter Q12H    Objective: Vital signs in last 24 hours: Temp:  [97.6 F (36.4 C)-99.4 F (37.4 C)] 98.9 F (37.2 C) (06/29 1430) Pulse Rate:  [75-99] 90 (06/29 1445) Resp:  [16-30] 20 (06/29 1445) BP: (110-135)/(57-107) 124/76 (06/29 1445) SpO2:  [82 %-98 %] 92 % (06/29 1445) Weight:  [96.6 kg-97.1 kg] 96.9 kg (06/29 1430)  LDA  HD cath rt femoral 06/19/22 PICC 06/17/22   PHYSICAL EXAM:  General: somnolent, not agitated- oriented in person and place  Lungs: b/l air entry Heart: tachycardia. Abdomen: Soft,   distension++ Bowel sounds sluggish Extremities: atraumatic, no cyanosis. No edema. No clubbing Skin: No rashes or lesions. Or bruising Lymph: Cervical, supraclavicular  normal. Neurologic: moves all extremities  Lab Results Recent Labs    06/23/22 0408 06/23/22 1606 06/23/22 1750 06/24/22 0613  WBC 23.9*  --   --  25.8*  HGB 8.2*  --  7.8* 7.6*  HCT 24.5*  --  24.0* 23.4*  NA 139 138  --  138  K 4.0 3.9  --  4.2  CL 109 107  --  107  CO2 24 25  --  27  BUN 53* 50*  --  51*  CREATININE 2.99* 2.62*  --  2.62*   Liver Panel Recent Labs    06/23/22 1606 06/24/22 0613  PROT  --  5.9*  ALBUMIN 1.9* 2.3*  AST  --  38  ALT  --  18  ALKPHOS  --  98  BILITOT  --  0.4    Microbiology: 06/08/22 BC NG 06/18/22 BC- NG  Radiology 06/24/22  No infiltrate   Possible ileus   Assessment/Plan: Acute necrotizing pancreatitis with multiorgan failure Peripancreatic edema Ascites Portal vein thrombosis on the right side Right hepatic microabscesses/phlegmon Because of fever and leukocytosis Zosyn was changed to meropenem on 06/18/22 and he has not had temp > 100.  Also on fluconazole. Will need to treat the liver phlegmon with 3-4 weeks of antibiotic Leukocytosis secondary to necrotic pancreatitis, liver abscess, Leucocytosis persist  The etiology for pancreatitis is unclear. Could it be the 2 doses of levaquin he took?  GI bleed secondary to duodenal ulcer.   No further bleeding   Encephalopathy mostly delirium.  Combination of pancreatitis, AKI, medications  AKI patient is now transitioned to HD  Anemia has received blood transfusion  Hypocalcemia secondary to pancreatitis causing very likely saponification of the fat over the pancreatic bed, omentum etc.  Discussed the management with the care team.

## 2022-06-24 NOTE — Consult Note (Signed)
PHARMACY NOTE:  ANTIMICROBIAL RENAL DOSAGE ADJUSTMENT  Current antimicrobial regimen includes a mismatch between antimicrobial dosage and estimated renal function.  As per policy approved by the Pharmacy & Therapeutics and Medical Executive Committees, the antimicrobial dosage will be adjusted accordingly.  Current antimicrobial dosage: Meropenem 1 g IV q8h, Diflucan 800 mg IV q24h  Indication: Necrotizing pancreatitis / liver abscess  Renal Function:  Estimated Creatinine Clearance: 28.5 mL/min (A) (by C-G formula based on SCr of 2.62 mg/dL (H)). '[x]'$      On intermittent HD    Antimicrobial dosage has been changed to:  Meropenem 500 mg IV q24h, Diflucan 400 mg IV q24h  Additional comments: Anuric. Continue to monitor renal function / RRT plan  Thank you for allowing pharmacy to be a part of this patient's care.  Xavier Esparza, [facility] 06/24/2022 2:51 PM

## 2022-06-24 NOTE — Progress Notes (Signed)
Progress Note   Patient: Xavier Esparza XNT:[id number] DOB: 26-Mar-1946 DOA: 06/08/2022     16 DOS: the patient was seen and examined on 06/24/2022   Brief hospital course: 76 y.o. male with medical history significant for DM, BPH, history of prostate cancer, kidney stones status post lithotripsy, negative colonoscopy 2022 (3 benign polyps), who presents to the ED by EMS following a syncopal episode at home while walking to the bathroom.  Wife said she heard a loud thud and went to find him on the floor.  She said he was awake when she got there but he could not help himself up and she called 911.  Patient states he was in his usual state of health but about 2 weeks prior he saw a little blood in his urine similar to when he had his kidney stone.  He saw his urologist yesterday who gave him a prescription for Levaquin.  He otherwise denied feeling unwell previous to the episode.  He denied chest pain, shortness of breath, headache or visual disturbance, one-sided numbness weakness or tingling, nausea or vomiting, abdominal pain, urinary frequency or burning or diarrhea.[facility] reported BP 76/45 and he was treated with IM epinephrine and a 400 mL fluid bolus.  He was awake and alert x4 by arrival.  After his arrival in the emergency room he developed abdominal pain and vomiting.   Initial CT abdomen pelvis with moderate acute pancreatitis no evidence of pancreatic necrosis or pseudocyst.  Follow-up MRI abdomen concerning for possible choledocholithiasis with evidence of necrotizing pancreatitis.  Case discussed with GI Dr. Marius Ditch at [facility].   There was mention of possible portal vein thrombosis.  Heparin GTT was initially started however patient developed suspected acute hematuria so heparin GTT was stopped.  VQ scan ordered and perfusion component will be completed prior to patient transfer.  6/15: Clinical status overall unchanged.  Patient remains with abdominal pain associated with nausea.  Hemodynamically  stable.  Mild elevation in white count.  Lipase and LFTs have improved over interval.  Discussed case with GI and general surgery.  Likely that patient's presentation is secondary to necrotizing pancreatitis.  Currently as there is no CBD stone and there is no strong indication for transfer for ERCP.  From a surgical standpoint no intervention is recommended.  There is nothing from an interventional radiology standpoint to drain.  As such we will discontinue transfer and keep patient in [facility].   6/16: No appreciable change in clinical status.  LFTs are downtrending.  White count slightly downtrending.  No fevers noted over interval.  Increasing abdominal distention noted.  Ascites and peripancreatic fluid seen on repeat CAT scan today.  Discussed case with PCCM, nephrology, GI.  Will attempt postpyloric feedings.  Kidney function continues to deteriorate.  Nephrology will initiate hemodialysis today.   6/17: No change in clinical status.  White count remains minimally elevated.  No fevers over interval.  Remains with abdominal distention.  Started HD yesterday.  We will run another round of HD today.   6/18: No appreciable clinical changes.  White blood cell count slightly downtrending.  Creatinine worsened despite 2 rounds of hemodialysis.  Remains with hematuria despite stopping heparin 2 days ago.   6/19: Increasing abd distention.  KUB consistent with ileus.  [place] negative for ascites   6/20: Postpyloric Dobbhoff discontinued.  NGT placed.  Tip in stomach. Set to low intermittent suction.  Rectal tube in place.  Abdominal distention improved.  White blood cell count increased to 22  For the week of 6/21 through 06/22/2022:  the patient had an upper GI bleed and was transfused 3 units of packed red blood cells.  He still remains anemic.  EGD showing gastric ulcers.  Patient was initially on Protonix drip now on Protonix twice a day.  Last hemoglobin 7.3.  CRRT was started on 06/17/2022 and continuing  as per nephrology.  The patient is still anuric.  The patient severe necrotizing pancreatitis.  Repeat CT scan showed increased nodularity in the omentum.  No discrete abscess about the pancreas.  Did see possible liver abscess.  Antibiotics were changed over to meropenem and Diflucan.  Repeat blood cultures on 06/18/2022 are negative.  The patient did have an ileus on x-ray so tube feeds were stopped.  Patient is on TPN and has a PICC line.  The patient's mental status has waxed and waned during the week and CT scan of the head was negative.  6/28: Patient continued to experience abdominal pain.  Nephrology is planning to continue CRRT for now.  Continue to have NG tube but secretions are decreasing.  Remained on TPN.  6/29: Clinically seems stable.  Continue to require pain medications.  Nephrology is going to start him on intermittent dialysis and stopping CRRT.  Also trying trickle feeds through NG tube as NG tube secretions remained minimal. Hypophosphatemia which is being repleted. Anemia panel consistent with anemia of chronic disease with some iron deficiency. Ferritin levels elevated. 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  With low-grade fever on 6/23 and I repeated blood cultures which are negative for 3 days.  Repeat CT scan does not show any abscess on the pancreas but does show fluid collection still in the liver which could be an abscess.  Empirically on meropenem and Diflucan.  Tumor markers ordered including CEA normal range at 0.8 and normal alpha-fetoprotein at less than 1.8.  CA 19-9 within normal range currently on TPN.   Patient is critically ill.  Appreciate palliative care consultation. Patient having bowel movement now -Starting trickle feed -Continue with TPN  Acute blood loss anemia Last hemoglobin 7.6.   Patient received 3 units of packed red blood cells during the hospital course.  Had GI bleed on 06/16/2022.  Having some blood loss with the continuous dialysis if the filter gets clotted and unable to return blood. Anemia panel consistent with anemia of chronic disease with some iron deficiency. -Will start him on iron supplement once tube feed get established   Upper GI bleed Secondary to duodenal ulcers.  No active bleeding on EGD.  Monitor closely with heparin with dialysis.  On Protonix IV twice daily  Severe sepsis ([place]) Present on admission secondary to severe necrotizing pancreatitis.  Empirically on meropenem and Diflucan.  Repeat blood cultures negative.  Acute metabolic encephalopathy Mental status waxes and wanes..  Will give as needed low-dose Seroquel at night. Need to continue pain medications.  AKI (acute kidney injury) [facility]) Patient started on CRRT on 06/17/2022.  Critical care team took out the catheter of the neck and put a catheter in the right groin for better CRRT on 6/24.  Patient still anuric. -Nephrology is switching him to intermittent dialysis today  Portal vein thrombosis Unable to do anticoagulation with drop in hemoglobin and upper GI bleed.  Syncope Secondary to hypotension   Nephrolithiasis Nonobstructing right renal stone.  No acute  issues suspected  History of prostate cancer No acute issues  BPH (benign prostatic hyperplasia) No acute issues  Diabetes mellitus without complication (HCC) Sugars elevated while on TPN.  On low-dose Semglee insulin at night.  Can probably get rid of Semglee insulin once TPN discontinued.    Subjective: Patient was seen and examined today.  He does not want me to touch his belly stating that he just got the pain medicine and now it seems helping.  Had a bowel movement.  No nausea.  Physical Exam: Vitals:   06/24/22 1430 06/24/22 1445 06/24/22 1500 06/24/22 1544  BP: 132/84 124/76 131/77   Pulse: 88 90 91 87   Resp: '19 20 19 16  '$ Temp: 98.9 F (37.2 C)   99.5 F (37.5 C)  TempSrc: Axillary   Axillary  SpO2: 92% 92% 95% 93%  Weight: 96.9 kg     Height:       General.  Ill-appearing gentleman, in no acute distress. Pulmonary.  Lungs clear bilaterally, normal respiratory effort. CV.  Regular rate and rhythm, no JVD, rub or murmur. Abdomen.  Soft, mild diffuse tenderness, nondistended, BS positive. CNS.  Alert and oriented .  No focal neurologic deficit. Extremities.  No edema, no cyanosis, pulses intact and symmetrical. Psychiatry.  Judgment and insight appears normal.  Data Reviewed: Prior data reviewed.  Family Communication: Wife at bedside  Disposition: Status is: Inpatient Remains inpatient appropriate because: Severity of illness   Planned Discharge Destination: To be determined  DVT prophylaxis.  Subcu heparin Time spent: 52 minutes  This record has been created using Systems analyst. Errors have been sought and corrected,but may not always be located. Such creation errors do not reflect on the standard of care.  Author: Lorella Nimrod, MD 06/24/2022 3:48 PM  For on call review [email or web address].

## 2022-06-24 NOTE — Progress Notes (Signed)
Palliative Care Progress Note, Assessment & Plan   Patient Name: Xavier Esparza       Date: 06/24/2022 DOB: [birth year]  Age: 76 y.o. MRN#: [id number] Attending Physician: Lorella Nimrod, MD Primary Care Physician: Tonia Ghent, MD Admit Date: 06/08/2022  Reason for Consultation/Follow-up: Establishing goals of care  Subjective: Patient is lying in bed in no apparent distress.  He does not acknowledges my presence by opening his eyes.  However, he will respond to voice.  Patient has intermittent engagement with caregivers.  He is oriented to self but requires repeated direction as how to not pull out lines and tubes.  No family at bedside presently.  HPI: 76 y.o. male  with past medical history of prostate cancer, nephrolithiasis, BPH, diabetes type 2, and HTN admitted on 06/08/2022 with syncopal episode at home. Patient is being treated for severe necrotizing pancreatitis, portal vein thrombosis, and a liver mass with possible abscess.  Patient also has AKI, was on CRRT, and is now receiving intermittent HD for the first time today. Pt also scheduled for tube feeds to start and [place] of liver today.   Palliative medicine team was consulted to discuss goals of care.   Summary of counseling/coordination of care: After reviewing the patient's chart and assessing the patient at bedside, participated in interdisciplinary rounds in the ICU.  Patient's prognosis remains guarded.  Patient is critically ill with multiorgan failure.  However, plan remains for full scope and full code.  Patient's wife is his Media planner.  She was not present at bedside today.  No changes to plan of care overnight.  Palliative medicine team will continue to follow the patient throughout his hospitalization.  Patient's wife Reginold Agent has  PMT contact information and was advised to call with any palliative needs.  I am on service and plan on continuing to round on the patient again tomorrow.  Code Status: Full code  Prognosis: Unable to determine  Discharge Planning: To Be Determined  Care plan was discussed with patient, Dr. Mortimer Fries, RN Raquel Sarna, NP Meda Coffee  Physical Exam Vitals reviewed.  Constitutional:      General: He is not in acute distress.    Appearance: He is not toxic-appearing.  HENT:     Head: Normocephalic.     Nose:     Comments: NG to right nare    Mouth/Throat:     Mouth: Mucous membranes are moist.  Eyes:     Pupils: Pupils are equal, round, and reactive to light.  Cardiovascular:     Rate and Rhythm: Normal rate.     Pulses: Normal pulses.  Pulmonary:     Effort: Pulmonary effort is normal.  Abdominal:     General: There is distension.  Musculoskeletal:     Comments: Generalized weakness  Skin:    General: Skin is warm.  Neurological:     Comments: Oriented to self, compliant             Palliative Assessment/Data: 30%    Total Time 25 minutes  Greater than 50%  of this time was spent counseling and coordinating care related to the above assessment and plan.  Thank you for allowing the Palliative Medicine Team to  assist in the care of this patient.  Vinton Ilsa Iha, FNP-BC Palliative Medicine Team Team Phone # [phone]

## 2022-06-24 NOTE — Progress Notes (Addendum)
PHARMACY - TOTAL PARENTERAL NUTRITION CONSULT NOTE   Indication: without adequate nutrition for > 7 days / ileus  Patient Measurements: Height: 6' (182.9 cm) Weight: 96.6 kg (212 lb 15.4 oz) IBW/kg (Calculated) : 77.6 TPN AdjBW (KG): 83.7 Body mass index is 28.88 kg/m.  Assessment: 76 y/o male with h/o DM, BPH, prostate cancer, hiatal hernia, kidney stones and HTN who is admitted with severe necrotizing pancreatitis, portal vein thrombosis, liver lesions, AKI and sepsis.  Glucose / Insulin: BG 156 - 202 (31 units SSI required) Electrolytes: Hypophosphatemia secondary to CRRT Renal: CRRT stopped 6/28 PM and transitioning to iHD Hepatic: Transaminitis resolved. LFTs within normal limits Intake / Output; MIVF: + 1.7L for the admission GI Imaging: 06/21/22 Abd Xray shows diffuse gaseous distention of bowel compatible with ileus GI Surgeries / Procedures: none recent  Central access: 06/17/22  TPN start date: 06/17/22   Nutritional Goals: Goal TPN rate is 90 mL/hr (provides 149 g of protein and 2499 kcals per day)  RD Assessment: Estimated Needs Total Energy Estimated Needs: 2350-2550 Total Protein Estimated Needs: 130-155 grams Total Fluid Estimated Needs: > 2 L  Current Nutrition:  NPO  Plan:  Continue TPN at 38m/hr --Will discuss with RD 6/30 re: fluid goals now that patient is off of CRRT and consider modification of TPN to decrease volume provided Nutritional components: Amino acids (using 15% Clinisol): 149 grams Dextrose: 339 grams Lipids (using 20% SMOFlipids) 75 grams kCal 2499  Electrolytes in TPN: Na 583m/L, K 2045mL, Ca 5mE54m, Mg 1mEq17m and Phos 1 mmol/L. Cl:Ac 1:1 Sodium phosphate 45 mmol IV x 1 per PCCM Add standard MVI and trace elements to TPN Modify resistant to sensitive q4h SSI and adjust as needed  Add 20u insulin to TPN per diabetes coordinator recommendations Discontinue Semglee and attempt to provide daily insulin requirement via TPN Monitor TPN  labs on Mon/Thurs, daily until stable  Shreshta Medley Benita Gutter/2023,8:48 AM

## 2022-06-24 NOTE — Assessment & Plan Note (Signed)
Patient started on CRRT on 06/17/2022.  Critical care team took out the catheter of the neck and put a catheter in the right groin for better CRRT on 6/24.  Patient still anuric. -Nephrology is switching him to intermittent dialysis today

## 2022-06-24 NOTE — Assessment & Plan Note (Signed)
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  With low-grade fever on 6/23 and I repeated blood cultures which are negative for 3 days.  Repeat CT scan does not show any abscess on the pancreas but does show fluid collection still in the liver which could be an abscess.  Empirically on meropenem and Diflucan.  Tumor markers ordered including CEA normal range at 0.8 and normal alpha-fetoprotein at less than 1.8.  CA 19-9 within normal range currently on TPN.   Patient is critically ill.  Appreciate palliative care consultation. Patient having bowel movement now -Starting trickle feed -Continue with TPN

## 2022-06-24 NOTE — Assessment & Plan Note (Signed)
Secondary to duodenal ulcers.  No active bleeding on EGD.  Monitor closely with heparin with dialysis.  On Protonix IV twice daily

## 2022-06-24 NOTE — Assessment & Plan Note (Signed)
Last hemoglobin 7.6.  Patient received 3 units of packed red blood cells during the hospital course.  Had GI bleed on 06/16/2022.  Having some blood loss with the continuous dialysis if the filter gets clotted and unable to return blood. Anemia panel consistent with anemia of chronic disease with some iron deficiency. -Will start him on iron supplement once tube feed get established

## 2022-06-24 NOTE — Progress Notes (Signed)
NAME:  Xavier Esparza, MRN:  [id number], DOB:  January 03, 1946, LOS: 74 ADMISSION DATE:  06/08/2022, CONSULTATION DATE:  19 June 2022 REFERRING MD:  Xavier Grayer, MD CHIEF COMPLAINT:  Severe pancreatitis, AKI, on CRRT   History of Present Illness:  Patient is a 76 year old who presented to [facility] on 08 June 2022 with severe pancreatitis.  Initial presentation was following a syncopal episode at home.  Patient initially had some hypotension which responded to fluids and modest lactic acidosis which responded to fluid resuscitation.  He has not required pressors during his admission.  Initially noted to be mentating well and then subsequently developing toxic metabolic encephalopathy and needed transfer to the ICU/stepdown area.  He was evaluated by PCCM on 15 June when he was transferred to the stepdown area.  The patient still remained hemodynamically stable and without respiratory distress.  On the 16th June he required dialysis catheter placement due to worsening renal failure and need for hemodialysis.  On 19 June PCCM signed off as though the patient was acutely ill with pancreatitis, there were no critical care issues pending.  Patient was receiving intermittent hemodialysis at that time listed as a stepdown status.  Because of requirement of fluid boluses during hemodialysis the patient was switched to CRRT on the evening of 22 June.  This was an automatic change in status from stepdown to ICU.  The patient continues to remain hemodynamically stable and not requiring ventilatory support at present.  We are asked to reconsult on the patient to continue to assist with any potential critical care issues that may develop.  Pertinent  Medical History  Diabetes mellitus Dyslipidemia Prostate cancer Nephrolithiasis  Significant Hospital Events: Including procedures, antibiotic start and stop dates in addition to other pertinent events   ANTIBIOTICS 06/08/2022 Vancomycin >>06/08/2022 06/08/2022 Flagyl  >>06/09/2022 06/08/2022 Cefepime>>06/09/2022 06/09/2022 PipTazo>>06/17/2022 06/18/2022 Meropenem>> 06/19/2022 Fluconazole>>  EVENTS 06/10/2022 Transferred to SDU 06/11/2022 dialysis catheter placement, hemodialysis initiated 06/17/2022 switch to CRRT, patient changed to ICU status 06/18/2022 EGD done, shows gastric/duodenal ulcers, on PPI twice daily 06/19/2022 PCCM reconsulted, recommendations discussed with hospitalist team, new dialysis catheter placed 06/20/2022 marked improvement in mental status, new HD catheter yesterday, addition of fluconazole 06/22/2022 no acute events overnight, remains on CRRT not requiring vasopressors  06/22/2022 Palliative Care consulted pt to remain Full Code per family request 06/23/2022 No acute events overnight remains on CRRT  06/24/2022 CRRT filter clotted this am possible plans to transition pt to HD per nephrology   Interim History / Subjective:  As outlined above   Objective   Blood pressure 129/71, pulse 83, temperature 99 F (37.2 C), temperature source Oral, resp. rate (!) 21, height 6' (1.829 m), weight 96.6 kg, SpO2 92 %.        Intake/Output Summary (Last 24 hours) at 06/24/2022 0752 Last data filed at 06/24/2022 0700 Gross per 24 hour  Intake 3172.58 ml  Output 2628 ml  Net 544.58 ml   Filed Weights   06/22/22 0500 06/23/22 0418 06/24/22 0500  Weight: 96.2 kg 94.3 kg 96.6 kg   Examination: General: Acutely ill-appearing male, NAD resting in bed  HEENT: No scleral edema, supple, no JVD  Lungs: Diminished throughout, even, non labored  Cardiovascular: NSR, rrr, no M/R/G, 2+ radial/1+ distal pulses, trace generalized edema  Abdomen: Distended, hypoactive bowel sounds x4, non tender  Extremities: No cyanosis, clubbing or edema. Neuro: Lethargic disoriented to place/time/situation, follows commands, PERRLA GU: Foley in place draining clear urine  Resolved Hospital Problem list  N/A  Assessment & Plan:   Severe necrotizing  pancreatitis Apparent new nodularity on pancreas and omentum Unclear etiology query due to Crestor (rosuvastatin) Supportive care Tumor markers >>CA 19-9~24 Continue TPN Continue bowel rest for now  Continue supportive care  Multiple liver abscesses/micro abscesses Cluster of low-attenuation lesion RIGHT hepatic lobe infection versus malignancy Continue meropenem and fluconazole  Tumor markers >> CA19-9~24 ID consulted appreciate input  Repeat KUB pending   Toxic metabolic encephalopathy - acute - IMPROVING Ammonia level normal Limit sedatives as much as able Correct metabolic abnormalities as able Address infectious processes Reorientation techniques Maintain wake/sleep cycle  Acute renal failure Nephrology consulted appreciate input: CRRT/HD per recommendations  Avoid nephrotoxins Dose medications according to CRRT New dialysis catheter 6/24  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 Malignancy cannot be excluded Cannot be placed on anticoagulants currently due to gastric/duodenal ulcers  Gastric/duodenal ulcers PPI twice daily No full dose anticoagulation for now due to anemia   Acute blood loss anemia Anemia of critical illness Transfuse for HgB < 7.0 Monitor for signs of bleeding  Best Practice (right click and "Reselect all SmartList Selections" daily)   Diet/type: TPN DVT prophylaxis: other Heparin through CRRT circuit GI prophylaxis: PPI Lines: Dialysis Catheter new/24 Foley:  Yes, and it is still needed Code Status:  full code Last date of multidisciplinary goals of care discussion [06/24/2022]  Labs   CBC: Recent Labs  Lab 06/20/22 0352 06/21/22 0414 06/22/22 0357 06/22/22 0458 06/22/22 2228 06/23/22 0408 06/23/22 1750 06/24/22 0613  WBC 27.5* 26.0* 20.2* 23.8*  --  23.9*  --  25.8*  NEUTROABS 24.5* 21.4*  --  19.5*  --  19.5*  --  21.4*  HGB 8.7* 8.0* 7.3* 7.3* 6.9* 8.2* 7.8* 7.6*  HCT 27.0* 24.0* 22.6* 22.6* 21.7* 24.5* 24.0*  23.4*  MCV 85.2 84.5 96.2 85.6  --  85.7  --  85.4  PLT 189 209 195 203  --  199  --  809    Basic Metabolic Panel: Recent Labs  Lab 06/21/22 0414 06/21/22 1555 06/22/22 0357 06/22/22 0458 06/22/22 1550 06/23/22 0408 06/23/22 1606 06/24/22 0613  NA 136  136 137  --  139 139 139 138 138  K 3.5  3.5 4.0  --  3.8 4.0 4.0 3.9 4.2  CL 103  103 104  --  108 106 109 107 107  CO2 '26  24 25  '$ --  '26 25 24 25 27  '$ GLUCOSE 204*  203* 187*  --  175* 155* 192* 167* 189*  BUN 56*  57* 55*  --  52* 53* 53* 50* 51*  CREATININE 3.59*  2.84* 3.34*  --  3.01* 3.02* 2.99* 2.62* 2.62*  CALCIUM 9.0  9.0 8.2*  --  8.1* 8.2* 8.2* 8.1* 8.2*  MG 2.1  --  2.3 2.3  --  2.4  --  2.4  PHOS 2.6  2.6 2.0*  --   --  1.8* 2.0* 1.7* 1.4*   GFR: Estimated Creatinine Clearance: 28.5 mL/min (A) (by C-G formula based on SCr of 2.62 mg/dL (H)). Recent Labs  Lab 06/19/22 1500 06/20/22 0352 06/22/22 0357 06/22/22 0458 06/23/22 0408 06/24/22 0613  WBC  --    < > 20.2* 23.8* 23.9* 25.8*  LATICACIDVEN 1.3  --   --   --   --   --    < > = values in this interval not displayed.    Liver Function Tests: Recent Labs  Lab 06/17/22 1015 06/17/22  1530 06/19/22 1539 06/20/22 0352 06/21/22 0414 06/21/22 1555 06/22/22 1550 06/23/22 0408 06/23/22 1606 06/24/22 0613  AST 86*  --  41  --  45*  --   --   --   --  38  ALT 58*  --  32  --  29  --   --   --   --  18  ALKPHOS 77  --  89  --  112  --   --   --   --  98  BILITOT 1.1  --  0.6  --  0.4  --   --   --   --  0.4  PROT 6.0*  --  5.3*  --  5.7*  --   --   --   --  5.9*  ALBUMIN 2.4*   < > 2.0*   < > 2.0*  2.1* 2.2* 2.1* 1.8* 1.9* 2.3*   < > = values in this interval not displayed.   No results for input(s): "LIPASE", "AMYLASE" in the last 168 hours. Recent Labs  Lab 06/19/22 1500  AMMONIA 20    ABG    Component Value Date/Time   PHART 7.3 (L) 06/19/2022 2130   PCO2ART 48 06/19/2022 2130   PO2ART 109 (H) 06/19/2022 2130   HCO3 23.6  06/19/2022 2130   ACIDBASEDEF 3.2 (H) 06/19/2022 2130   O2SAT 98.3 06/19/2022 2130     Coagulation Profile: No results for input(s): "INR", "PROTIME" in the last 168 hours.  Cardiac Enzymes: No results for input(s): "CKTOTAL", "CKMB", "CKMBINDEX", "TROPONINI" in the last 168 hours.  HbA1C: Hgb A1c MFr Bld  Date/Time Value Ref Range Status  01/08/2022 10:34 AM 6.2 4.6 - 6.5 % Final    Comment:    Glycemic Control Guidelines for People with Diabetes:Non Diabetic:  <6%Goal of Therapy: <7%Additional Action Suggested:  >8%   07/02/2021 08:46 AM 6.0 4.6 - 6.5 % Final    Comment:    Glycemic Control Guidelines for People with Diabetes:Non Diabetic:  <6%Goal of Therapy: <7%Additional Action Suggested:  >8%     CBG: Recent Labs  Lab 06/23/22 1935 06/23/22 2312 06/24/22 0441 06/24/22 0733 06/24/22 0741  GLUCAP 181* 177* 174* 187* 202*    Review of Systems: Positives in BOLD   Gen: Denies fever, chills, weight change, fatigue, night sweats HEENT: Denies blurred vision, double vision, hearing loss, tinnitus, sinus congestion, rhinorrhea, sore throat, neck stiffness, dysphagia PULM: Denies shortness of breath, cough, sputum production, hemoptysis, wheezing CV: Denies chest pain, edema, orthopnea, paroxysmal nocturnal dyspnea, palpitations GI: abdominal pain, nausea, vomiting, diarrhea, hematochezia, melena, constipation, change in bowel habits GU: Denies dysuria, hematuria, polyuria, oliguria, urethral discharge Endocrine: Denies hot or cold intolerance, polyuria, polyphagia or appetite change Derm: Denies rash, dry skin, scaling or peeling skin change Heme: Denies easy bruising, bleeding, bleeding gums Neuro: Denies headache, numbness, weakness, slurred speech, loss of memory or consciousness   Allergies No Known Allergies   Home Medications  Prior to Admission medications   Medication Sig Start Date End Date Taking? Authorizing Provider  acyclovir (ZOVIRAX) 800 MG tablet TAKE  1 TABLET DAILY 01/25/22  Yes Tonia Ghent, MD  aspirin EC 81 MG tablet Take 81 mg by mouth daily. Swallow whole.   Yes [provider]  Calcium Carbonate (CALCIUM 600 PO) Take 2 tablets by mouth daily.   Yes [provider]  GLUCOSAMINE HCL PO Take 1 tablet by mouth daily.    Yes [provider]  losartan (COZAAR)  25 MG tablet Take 1 tablet (25 mg total) by mouth daily. 12/29/21  Yes Tower, Wynelle Fanny, MD  Magnesium 500 MG TABS Take 1 tablet by mouth daily.   Yes [provider]  metFORMIN (GLUCOPHAGE) 500 MG tablet TAKE 1 TABLET DAILY 01/25/22  Yes Tonia Ghent, MD  Multiple Vitamin (MULTIVITAMIN) tablet Take 1 tablet by mouth daily. Reported on 01/07/2016   Yes [provider]  Omega-3 Fatty Acids ([employer] FISH OIL PO) Take 1,000 mg by mouth daily.   Yes [provider]  Red Yeast Rice 600 MG TABS Take 2 tablets (1,200 mg total) by mouth daily. 01/08/22  Yes Tonia Ghent, MD  rosuvastatin (CRESTOR) 20 MG tablet TAKE 1 TABLET EVERY OTHER  DAY IN THE MORNING. NEEDS TO CALL OFFICE TO SCHEDULE YEARLY EXAM 04/12/22  Yes Tonia Ghent, MD  Testosterone 1.62 % GEL Apply 2 Pump topically daily. 05/12/22  Yes [provider]    Hospital medications Scheduled Meds:  Chlorhexidine Gluconate Cloth  6 each Topical Q0600   insulin aspart  0-20 Units Subcutaneous Q4H   insulin glargine-yfgn  8 Units Subcutaneous QHS   multivitamin  1 tablet Per Tube QHS   mouth rinse  15 mL Mouth Rinse 4 times per day   pantoprazole  40 mg Intravenous Q12H   sodium chloride flush  10-40 mL Intracatheter Q12H   Continuous Infusions:   prismasol BGK 4/2.5 300 mL/hr at 06/24/22 0605    prismasol BGK 4/2.5 300 mL/hr at 06/24/22 0553   sodium chloride Stopped (06/19/22 2153)   albumin human Stopped (06/23/22 2344)   fluconazole (DIFLUCAN) IV Stopped (06/23/22 2025)   heparin 10,000 units/ 20 mL infusion syringe 250 Units/hr (06/24/22 0600)   meropenem  (MERREM) IV Stopped (06/24/22 0641)   prismasol BGK 4/2.5 1,500 mL/hr at 06/24/22 0527   sodium phosphate 45 mmol in dextrose 5 % 250 mL infusion     TPN ADULT (ION) 90 mL/hr at 06/24/22 0700   PRN Meds:.sodium chloride, acetaminophen **OR** acetaminophen, alteplase, guaiFENesin-dextromethorphan, haloperidol lactate, heparin, HYDROmorphone (DILAUDID) injection, hydrOXYzine, ipratropium-albuterol, lidocaine (PF), lidocaine-prilocaine, lip balm, mouth rinse, mouth rinse, oxyCODONE, pentafluoroprop-tetrafluoroeth, QUEtiapine, sodium chloride, sodium chloride flush   Critical care time: 30 minutes    Donell Beers, [place] Pager [phone] (please enter 7 digits) PCCM Consult Pager [phone] (please enter 7 digits)

## 2022-06-24 NOTE — Progress Notes (Signed)
Hemodialysis Post Treatment Note:  Tx date:06/24/2022 Tx time: 2.5 hours Access: right femoral catheter UF Removed: 500 ml  Note:   HD treatment completed. Tolerated well. Patient was  just sleeping all throughout the treatment but easily to arouse. No HD complications noted. Post HD BP 124/76

## 2022-06-25 ENCOUNTER — Encounter
Admission: EM | Discharge status: Against Medical Advice | Payer: Self-pay | Source: Home / Self Care | Attending: Internal Medicine

## 2022-06-25 DIAGNOSIS — N179 Acute kidney failure, unspecified: Secondary | ICD-10-CM | POA: Diagnosis not present

## 2022-06-25 DIAGNOSIS — R6520 Severe sepsis without septic shock: Secondary | ICD-10-CM | POA: Diagnosis not present

## 2022-06-25 DIAGNOSIS — I81 Portal vein thrombosis: Secondary | ICD-10-CM | POA: Diagnosis not present

## 2022-06-25 DIAGNOSIS — N186 End stage renal disease: Secondary | ICD-10-CM

## 2022-06-25 DIAGNOSIS — K8591 Acute pancreatitis with uninfected necrosis, unspecified: Secondary | ICD-10-CM | POA: Diagnosis not present

## 2022-06-25 DIAGNOSIS — G9341 Metabolic encephalopathy: Secondary | ICD-10-CM | POA: Diagnosis not present

## 2022-06-25 DIAGNOSIS — N400 Enlarged prostate without lower urinary tract symptoms: Secondary | ICD-10-CM | POA: Diagnosis not present

## 2022-06-25 DIAGNOSIS — A419 Sepsis, unspecified organism: Secondary | ICD-10-CM | POA: Diagnosis not present

## 2022-06-25 HISTORY — PX: DIALYSIS/PERMA CATHETER INSERTION: CATH118288

## 2022-06-25 LAB — CBC WITH DIFFERENTIAL/PLATELET
Abs Immature Granulocytes: 1.12 10*3/uL — ABNORMAL HIGH (ref 0.00–0.07)
Basophils Absolute: 0.1 10*3/uL (ref 0.0–0.1)
Basophils Relative: 0 %
Eosinophils Absolute: 0.1 10*3/uL (ref 0.0–0.5)
Eosinophils Relative: 0 %
HCT: 21.3 % — ABNORMAL LOW (ref 39.0–52.0)
Hemoglobin: 7.1 g/dL — ABNORMAL LOW (ref 13.0–17.0)
Immature Granulocytes: 4 %
Lymphocytes Relative: 5 %
Lymphs Abs: 1.2 10*3/uL (ref 0.7–4.0)
MCH: 28.3 pg (ref 26.0–34.0)
MCHC: 33.3 g/dL (ref 30.0–36.0)
MCV: 84.9 fL (ref 80.0–100.0)
Monocytes Absolute: 2 10*3/uL — ABNORMAL HIGH (ref 0.1–1.0)
Monocytes Relative: 8 %
Neutro Abs: 21.2 10*3/uL — ABNORMAL HIGH (ref 1.7–7.7)
Neutrophils Relative %: 83 %
Platelets: 179 10*3/uL (ref 150–400)
RBC: 2.51 MIL/uL — ABNORMAL LOW (ref 4.22–5.81)
RDW: 17.2 % — ABNORMAL HIGH (ref 11.5–15.5)
WBC: 25.7 10*3/uL — ABNORMAL HIGH (ref 4.0–10.5)
nRBC: 0.1 % (ref 0.0–0.2)

## 2022-06-25 LAB — RENAL FUNCTION PANEL
Albumin: 2.4 g/dL — ABNORMAL LOW (ref 3.5–5.0)
Anion gap: 10 (ref 5–15)
BUN: 71 mg/dL — ABNORMAL HIGH (ref 8–23)
CO2: 24 mmol/L (ref 22–32)
Calcium: 8.5 mg/dL — ABNORMAL LOW (ref 8.9–10.3)
Chloride: 103 mmol/L (ref 98–111)
Creatinine, Ser: 4 mg/dL — ABNORMAL HIGH (ref 0.61–1.24)
GFR, Estimated: 15 mL/min — ABNORMAL LOW (ref >60)
Glucose, Bld: 184 mg/dL — ABNORMAL HIGH (ref 70–99)
Phosphorus: 4.1 mg/dL (ref 2.5–4.6)
Potassium: 3.9 mmol/L (ref 3.5–5.1)
Sodium: 137 mmol/L (ref 135–145)

## 2022-06-25 LAB — GLUCOSE, CAPILLARY
Glucose-Capillary: 166 mg/dL — ABNORMAL HIGH (ref 70–99)
Glucose-Capillary: 172 mg/dL — ABNORMAL HIGH (ref 70–99)
Glucose-Capillary: 181 mg/dL — ABNORMAL HIGH (ref 70–99)
Glucose-Capillary: 144 mg/dL — ABNORMAL HIGH (ref 70–99)
Glucose-Capillary: 131 mg/dL — ABNORMAL HIGH (ref 70–99)
Glucose-Capillary: 172 mg/dL — ABNORMAL HIGH (ref 70–99)
Glucose-Capillary: 159 mg/dL — ABNORMAL HIGH (ref 70–99)

## 2022-06-25 LAB — MAGNESIUM: Magnesium: 2 mg/dL (ref 1.7–2.4)

## 2022-06-25 SURGERY — DIALYSIS/PERMA CATHETER INSERTION
Anesthesia: Moderate Sedation

## 2022-06-25 MED ORDER — MIDAZOLAM HCL 2 MG/ML PO SYRP: 8.0000 mg | ORAL_SOLUTION | Freq: Once | ORAL | Status: DC | PRN

## 2022-06-25 MED ORDER — CEFAZOLIN SODIUM-DEXTROSE 1-4 GM/50ML-% IV SOLN
INTRAVENOUS | Status: AC
  Administered 2022-06-25: 1 g via INTRAVENOUS
  Filled 2022-06-25: qty 50

## 2022-06-25 MED ORDER — HYDROMORPHONE HCL 1 MG/ML IJ SOLN
1.0000 mg | Freq: Once | INTRAMUSCULAR | Status: AC | PRN
  Administered 2022-06-28: 1 mg via INTRAVENOUS
  Filled 2022-06-25: qty 1

## 2022-06-25 MED ORDER — FENTANYL CITRATE PF 50 MCG/ML IJ SOSY
PREFILLED_SYRINGE | INTRAMUSCULAR | Status: AC
  Filled 2022-06-25: qty 1

## 2022-06-25 MED ORDER — ONDANSETRON HCL 4 MG/2ML IJ SOLN: 4.0000 mg | Freq: Four times a day (QID) | INTRAMUSCULAR | Status: DC | PRN

## 2022-06-25 MED ORDER — PROSOURCE TF PO LIQD
45.0000 mL | Freq: Two times a day (BID) | ORAL | Status: DC
Start: 2022-06-26 — End: 2022-06-26
  Administered 2022-06-26: 45 mL
  Filled 2022-06-25: qty 45

## 2022-06-25 MED ORDER — VITAL 1.5 CAL PO LIQD
1000.0000 mL | ORAL | Status: DC
  Administered 2022-06-25 – 2022-06-26 (×2): 1000 mL

## 2022-06-25 MED ORDER — FAMOTIDINE 20 MG PO TABS: 40.0000 mg | ORAL_TABLET | Freq: Once | ORAL | Status: DC | PRN

## 2022-06-25 MED ORDER — FENTANYL CITRATE (PF) 100 MCG/2ML IJ SOLN
INTRAMUSCULAR | Status: DC | PRN
  Administered 2022-06-25: 25 ug via INTRAVENOUS

## 2022-06-25 MED ORDER — TRACE MINERALS CU-MN-SE-ZN 300-55-60-3000 MCG/ML IV SOLN
INTRAVENOUS | Status: AC
  Filled 2022-06-25: qty 806.4

## 2022-06-25 MED ORDER — SODIUM CHLORIDE 0.9 % IV SOLN
INTRAVENOUS | Status: DC
  Administered 2022-06-25: 10 mL/h via INTRAVENOUS

## 2022-06-25 MED ORDER — MIDAZOLAM HCL 2 MG/2ML IJ SOLN
INTRAMUSCULAR | Status: DC | PRN
  Administered 2022-06-25: 1 mg via INTRAVENOUS

## 2022-06-25 MED ORDER — INSULIN ASPART 100 UNIT/ML IJ SOLN
0.0000 [IU] | INTRAMUSCULAR | Status: DC
  Administered 2022-06-25 – 2022-06-26 (×4): 3 [IU] via SUBCUTANEOUS
  Administered 2022-06-26: 5 [IU] via SUBCUTANEOUS
  Administered 2022-06-26: 3 [IU] via SUBCUTANEOUS
  Administered 2022-06-26: 5 [IU] via SUBCUTANEOUS
  Administered 2022-06-26 – 2022-06-27 (×2): 3 [IU] via SUBCUTANEOUS
  Administered 2022-06-27: 5 [IU] via SUBCUTANEOUS
  Administered 2022-06-27 (×4): 3 [IU] via SUBCUTANEOUS
  Administered 2022-06-27 – 2022-06-28 (×2): 5 [IU] via SUBCUTANEOUS
  Administered 2022-06-28 (×2): 3 [IU] via SUBCUTANEOUS
  Administered 2022-06-28: 2 [IU] via SUBCUTANEOUS
  Administered 2022-06-28 (×2): 3 [IU] via SUBCUTANEOUS
  Administered 2022-06-29: 5 [IU] via SUBCUTANEOUS
  Administered 2022-06-29: 3 [IU] via SUBCUTANEOUS
  Administered 2022-06-29: 5 [IU] via SUBCUTANEOUS
  Filled 2022-06-25 (×25): qty 1

## 2022-06-25 MED ORDER — METHYLPREDNISOLONE SODIUM SUCC 125 MG IJ SOLR: 125.0000 mg | Freq: Once | INTRAMUSCULAR | Status: DC | PRN

## 2022-06-25 MED ORDER — CEFAZOLIN SODIUM-DEXTROSE 1-4 GM/50ML-% IV SOLN: 1.0000 g | INTRAVENOUS | Status: AC

## 2022-06-25 MED ORDER — DIPHENHYDRAMINE HCL 50 MG/ML IJ SOLN: 50.0000 mg | Freq: Once | INTRAMUSCULAR | Status: DC | PRN

## 2022-06-25 MED ORDER — HEPARIN SODIUM (PORCINE) 1000 UNIT/ML IJ SOLN
INTRAMUSCULAR | Status: AC
  Filled 2022-06-25: qty 10

## 2022-06-25 MED ORDER — HEPARIN SODIUM (PORCINE) 10000 UNIT/ML IJ SOLN
INTRAMUSCULAR | Status: AC
  Filled 2022-06-25: qty 1

## 2022-06-25 MED ORDER — MIDAZOLAM HCL 2 MG/2ML IJ SOLN
INTRAMUSCULAR | Status: AC
  Filled 2022-06-25: qty 2

## 2022-06-25 SURGICAL SUPPLY — 9 items
ADH SKN CLS APL DERMABOND .7 (GAUZE/BANDAGES/DRESSINGS) ×1
BIOPATCH RED 1 DISK 7.0 (GAUZE/BANDAGES/DRESSINGS) ×1 IMPLANT
CATH CANNON HEMO 15FR 19 (HEMODIALYSIS SUPPLIES) ×1 IMPLANT
COVER PROBE U/S 5X48 (MISCELLANEOUS) ×1 IMPLANT
DERMABOND ADVANCED (GAUZE/BANDAGES/DRESSINGS) ×1
DERMABOND ADVANCED .7 DNX12 (GAUZE/BANDAGES/DRESSINGS) IMPLANT
PACK ANGIOGRAPHY (CUSTOM PROCEDURE TRAY) ×1 IMPLANT
SUT MNCRL AB 4-0 PS2 18 (SUTURE) ×1 IMPLANT
SUT PROLENE 0 CT 1 30 (SUTURE) ×1 IMPLANT

## 2022-06-25 NOTE — Op Note (Signed)
OPERATIVE NOTE    PRE-OPERATIVE DIAGNOSIS: 1. ESRD   POST-OPERATIVE DIAGNOSIS: same as above  PROCEDURE: Ultrasound guidance for vascular access to the right internal jugular vein Fluoroscopic guidance for placement of catheter Placement of a 19 cm tip to cuff tunneled hemodialysis catheter via the right internal jugular vein  SURGEON: Leotis Pain, MD  ANESTHESIA:  Local with Moderate conscious sedation for approximately 22 minutes using 1 mg of Versed and 25 mcg of Fentanyl  ESTIMATED BLOOD LOSS: 25 cc  FLUORO TIME: less than one minute  CONTRAST: none  FINDING(S): 1.  Patent right internal jugular vein  SPECIMEN(S):  None  INDICATIONS:   Xavier Esparza is a 76 y.o.male who presents with renal failure.  The patient needs long term dialysis access for their ESRD, and a Permcath is necessary.  Risks and benefits are discussed and informed consent is obtained.    DESCRIPTION: After obtaining full informed written consent, the patient was brought back to the vascular suited. The patient's right neck and chest were sterilely prepped and draped in a sterile surgical field was created. Moderate conscious sedation was administered during a face to face encounter with the patient throughout the procedure with my supervision of the RN administering medicines and monitoring the patient's vital signs, pulse oximetry, telemetry and mental status throughout from the start of the procedure until the patient was taken to the recovery room.  The right internal jugular vein was visualized with ultrasound and found to be patent. It was then accessed under direct ultrasound guidance and a permanent image was recorded. A wire was placed. After skin nick and dilatation, the peel-away sheath was placed over the wire. I then turned my attention to an area under the clavicle. Approximately 1-2 fingerbreadths below the clavicle a small counterincision was created and tunneled from the subclavicular incision to  the access site. Using fluoroscopic guidance, a 19 centimeter tip to cuff tunneled hemodialysis catheter was selected, and tunneled from the subclavicular incision to the access site. It was then placed through the peel-away sheath and the peel-away sheath was removed. Using fluoroscopic guidance the catheter tips were parked in the right atrium. The appropriate distal connectors were placed. It withdrew blood well and flushed easily with heparinized saline and a concentrated heparin solution was then placed. It was secured to the chest wall with 2 Prolene sutures. The access incision was closed single 4-0 Monocryl. A 4-0 Monocryl pursestring suture was placed around the exit site. Sterile dressings were placed. The patient tolerated the procedure well and was taken to the recovery room in stable condition.  COMPLICATIONS: None  CONDITION: Stable  Leotis Pain, MD 06/25/2022 4:57 PM   This note was created with Dragon Medical transcription system. Any errors in dictation are purely unintentional.

## 2022-06-25 NOTE — Progress Notes (Signed)
[facility]  ROUNDING NOTE   Subjective:   Patient seen at bedside in ICU Wife at bedside Alert and oriented today TPN at 19m/hr NGT in place, moderate output Hemodialysis received yesterday, tolerated well on low BFR Anuric  Objective:  Vital signs in last 24 hours:  Temp:  [98.9 F (37.2 C)-100 F (37.8 C)] 100 F (37.8 C) (06/30 1110) Pulse Rate:  [29-99] 95 (06/30 1130) Resp:  [16-45] 29 (06/30 1130) BP: (97-134)/(51-94) 122/60 (06/30 1116) SpO2:  [87 %-100 %] 92 % (06/30 1130) Weight:  [96.9 kg-99.6 kg] 99.6 kg (06/30 1112)  Weight change: 0.5 kg Filed Weights   06/24/22 1430 06/25/22 0500 06/25/22 1112  Weight: 96.9 kg 98.2 kg 99.6 kg    Intake/Output: I/O last 3 completed shifts: In: 4437.9 [I.V.:3018.5; Other:100; NG/GT:136.5; IV Piggyback:1182.8] Out: 2543 [Emesis/NG output:200; OKNLZJ:6734 Stool:400]   Intake/Output this shift:  Total I/O In: 696 [I.V.:626; NG/GT:70] Out: -   Physical Exam: General: NAD, resting comfortably  Head: Normocephalic, atraumatic. Dry oral mucosal membranes  Eyes: Anicteric  Lungs:  Diminished in bases, normal effort, Ray O2  Heart: Regular rate and rhythm  Abdomen:  Firm, distended, mild tenderness  Extremities: Trace peripheral edema.  Neurologic: Alert, simple responses  Skin: Warm, dry  Access: Femoral  dialysis catheter 06/19/2022.      Basic Metabolic Panel: Recent Labs  Lab 06/22/22 0357 06/22/22 0458 06/22/22 1550 06/23/22 0408 06/23/22 1606 06/24/22 0613 06/24/22 1353 06/24/22 1949 06/25/22 0531  NA  --  139   < > 139 138 138  --  136 137  K  --  3.8   < > 4.0 3.9 4.2  --  3.8 3.9  CL  --  108   < > 109 107 107  --  99 103  CO2  --  26   < > '24 25 27  '$ --  25 24  GLUCOSE  --  175*   < > 192* 167* 189*  --  160* 184*  BUN  --  52*   < > 53* 50* 51*  --  48* 71*  CREATININE  --  3.01*   < > 2.99* 2.62* 2.62*  --  3.20* 4.00*  CALCIUM  --  8.1*   < > 8.2* 8.1* 8.2*  --  8.1* 8.5*  MG 2.3  2.3  --  2.4  --  2.4  --   --  2.0  PHOS  --   --    < > 2.0* 1.7* 1.4* 2.7 4.1  4.1 4.1   < > = values in this interval not displayed.     Liver Function Tests: Recent Labs  Lab 06/19/22 1539 06/20/22 0352 06/21/22 0414 06/21/22 1555 06/23/22 0408 06/23/22 1606 06/24/22 0613 06/24/22 1949 06/25/22 0531  AST 41  --  45*  --   --   --  38  --   --   ALT 32  --  29  --   --   --  18  --   --   ALKPHOS 89  --  112  --   --   --  98  --   --   BILITOT 0.6  --  0.4  --   --   --  0.4  --   --   PROT 5.3*  --  5.7*  --   --   --  5.9*  --   --   ALBUMIN 2.0*   < >  2.0*  2.1*   < > 1.8* 1.9* 2.3* 2.3* 2.4*   < > = values in this interval not displayed.    No results for input(s): "LIPASE", "AMYLASE" in the last 168 hours. Recent Labs  Lab 06/19/22 1500  AMMONIA 20      CBC: Recent Labs  Lab 06/21/22 0414 06/22/22 0357 06/22/22 0458 06/22/22 2228 06/23/22 0408 06/23/22 1750 06/24/22 0613 06/25/22 0531  WBC 26.0* 20.2* 23.8*  --  23.9*  --  25.8* 25.7*  NEUTROABS 21.4*  --  19.5*  --  19.5*  --  21.4* 21.2*  HGB 8.0* 7.3* 7.3* 6.9* 8.2* 7.8* 7.6* 7.1*  HCT 24.0* 22.6* 22.6* 21.7* 24.5* 24.0* 23.4* 21.3*  MCV 84.5 96.2 85.6  --  85.7  --  85.4 84.9  PLT 209 195 203  --  199  --  175 179     Cardiac Enzymes: No results for input(s): "CKTOTAL", "CKMB", "CKMBINDEX", "TROPONINI" in the last 168 hours.   BNP: Invalid input(s): "POCBNP"  CBG: Recent Labs  Lab 06/24/22 1939 06/24/22 2319 06/25/22 0411 06/25/22 0727 06/25/22 1127  GLUCAP 152* 170* 166* 172* 181*     Microbiology: Results for orders placed or performed during the hospital encounter of 06/08/22  Blood culture (routine x 2)     Status: None   Collection Time: 06/08/22  1:33 PM   Specimen: BLOOD  Result Value Ref Range Status   Specimen Description BLOOD LEFT HAND  Final   Special Requests   Final    BOTTLES DRAWN AEROBIC AND ANAEROBIC Blood Culture results may not be optimal due to an  inadequate volume of blood received in culture bottles   Culture   Final    NO GROWTH 5 DAYS Performed at [facility], [address] [place], [place] [address]    Report Status 06/13/2022 FINAL  Final  Blood culture (routine x 2)     Status: None   Collection Time: 06/08/22  2:15 PM   Specimen: BLOOD  Result Value Ref Range Status   Specimen Description BLOOD BLOOD LEFT WRIST  Final   Special Requests   Final    BOTTLES DRAWN AEROBIC AND ANAEROBIC Blood Culture results may not be optimal due to an inadequate volume of blood received in culture bottles   Culture   Final    NO GROWTH 5 DAYS Performed at [facility], [address] [place], [place] [address]    Report Status 06/13/2022 FINAL  Final  MRSA Next Gen by PCR, Nasal     Status: None   Collection Time: 06/10/22  4:14 PM   Specimen: Nasal Mucosa; Nasal Swab  Result Value Ref Range Status   MRSA by PCR Next Gen NOT DETECTED NOT DETECTED Final    Comment: (NOTE) The GeneXpert MRSA Assay (FDA approved for NASAL specimens only), is one component of a comprehensive MRSA colonization surveillance program. It is not intended to diagnose MRSA infection nor to guide or monitor treatment for MRSA infections. Test performance is not FDA approved in patients less than 20 years old. Performed at [facility], [address] [place],  [address]   SARS Coronavirus 2 by RT PCR (hospital order, performed in [facility] hospital lab) *cepheid single result test* Anterior Nasal Swab     Status: None   Collection Time: 06/10/22  5:17 PM   Specimen: Anterior Nasal Swab  Result Value Ref Range Status   SARS Coronavirus 2 by RT PCR NEGATIVE  NEGATIVE Final    Comment: (NOTE) SARS-CoV-2 target nucleic acids are NOT DETECTED.  The SARS-CoV-2 RNA is generally detectable in upper and lower respiratory specimens during the acute phase of infection. The lowest concentration of SARS-CoV-2 viral copies this  assay can detect is 250 copies / mL. A negative result does not preclude SARS-CoV-2 infection and should not be used as the sole basis for treatment or other patient management decisions.  A negative result may occur with improper specimen collection / handling, submission of specimen other than nasopharyngeal swab, presence of viral mutation(s) within the areas targeted by this assay, and inadequate number of viral copies (<250 copies / mL). A negative result must be combined with clinical observations, patient history, and epidemiological information.  Fact Sheet for Patients:   https://www.patel.info/  Fact Sheet for Healthcare Providers: https://hall.com/  This test is not yet approved or  cleared by the [place] FDA and has been authorized for detection and/or diagnosis of SARS-CoV-2 by FDA under an Emergency Use Authorization (EUA).  This EUA will remain in effect (meaning this test can be used) for the duration of the COVID-19 declaration under Section 564(b)(1) of the Act, 21 U.S.C. section 360bbb-3(b)(1), unless the authorization is terminated or revoked sooner.  Performed at [facility], [address] [place], [place] [address]   Culture, blood (Routine X 2) w Reflex to ID Panel     Status: None   Collection Time: 06/18/22 10:03 AM   Specimen: BLOOD  Result Value Ref Range Status   Specimen Description BLOOD RIGHT HAND  Final   Special Requests   Final    BOTTLES DRAWN AEROBIC ONLY Blood Culture adequate volume   Culture   Final    NO GROWTH 5 DAYS Performed at [facility], [address], [place], [place] [address]    Report Status 06/23/2022 FINAL  Final  Culture, blood (Routine X 2) w Reflex to ID Panel     Status: None   Collection Time: 06/18/22 10:20 AM   Specimen: BLOOD  Result Value Ref Range Status   Specimen Description BLOOD LEFT [facility]  Final   Special Requests   Final    BOTTLES  DRAWN AEROBIC AND ANAEROBIC Blood Culture adequate volume   Culture   Final    NO GROWTH 5 DAYS Performed at [facility], [address], [place],  [address]    Report Status 06/23/2022 FINAL  Final    Coagulation Studies: No results for input(s): "LABPROT", "INR" in the last 72 hours.   Urinalysis: No results for input(s): "COLORURINE", "LABSPEC", "PHURINE", "GLUCOSEU", "HGBUR", "BILIRUBINUR", "KETONESUR", "PROTEINUR", "UROBILINOGEN", "NITRITE", "LEUKOCYTESUR" in the last 72 hours.  Invalid input(s): "APPERANCEUR"     Imaging: US Abdomen Limited RUQ (LIVER/GB)  Result Date: 06/24/2022 CLINICAL DATA:  Liver abscess EXAM: ULTRASOUND ABDOMEN LIMITED RIGHT UPPER QUADRANT COMPARISON:  CT 06/18/2022, 06/11/2022, MRI 06/08/2022 FINDINGS: Gallbladder: No gallstones or wall thickening visualized. No sonographic Murphy sign noted by sonographer. Common bile duct: Diameter: 6.1 mm Liver: Heterogenous echotexture. Cyst in the right posterior hepatic lobe measuring 3.2 x 5.2 x 2.9 cm. Multiple additional cysts within the liver, including septated cyst in the left hepatic lobe measuring 3.7 x 4.3 x 4 cm. No discrete abscess collection to correspond to the questioned area on CT and MRI. Portal vein is patent on color Doppler imaging with normal direction of blood flow towards the liver. Other: None. IMPRESSION: 1. No definite drainable abscess collection by ultrasound to correspond to  the areas seen on CT and MRI. 2. There are multiple liver cysts both simple and complex, see MRI 06/08/2022. Electronically Signed   By: Donavan Foil M.D.   On: 06/24/2022 16:47   DG Chest Port 1 View  Result Date: 06/24/2022 CLINICAL DATA:  Abdominal distension and hypoxia. EXAM: PORTABLE CHEST 1 VIEW COMPARISON:  Chest radiograph 06/14/2022 and abdominal CT 06/18/2022 FINDINGS: Nasogastric tube extends into the abdomen. Right arm PICC line tip terminates in the SVC region. Again noted are slightly low  lung volumes. Improved aeration at the right lung base compared to the previous chest radiographic examination. Few densities along the medial right lung base are suggestive for atelectasis. Otherwise, no significant airspace disease or consolidation. Heart size is normal. IMPRESSION: Residual densities at the medial right lung base probably represent atelectasis. Otherwise, no acute chest findings. Electronically Signed   By: Markus Daft M.D.   On: 06/24/2022 09:12   DG Abd 1 View  Result Date: 06/24/2022 CLINICAL DATA:  Abdominal distension and hypoxia. EXAM: ABDOMEN - 1 VIEW COMPARISON:  06/21/2022 FINDINGS: There is a right femoral dialysis catheter with the tip in the lower IVC region. Brachytherapy seeds in the prostate. Bowel gas throughout the abdomen and pelvis. There is evidence for gas in the stomach, small bowel and colon. There is a nasogastric tube which terminates in the stomach body region. Limited evaluation for free air on the supine images. IMPRESSION: 1. Mild gaseous distension throughout the abdomen and pelvis. Findings are nonspecific but could represent an ileus pattern. 2. Right femoral dialysis catheter with the tip in the lower IVC region. Electronically Signed   By: Markus Daft M.D.   On: 06/24/2022 09:06     Medications:    sodium chloride Stopped (06/19/22 2153)   albumin human Stopped (06/24/22 2315)   fluconazole (DIFLUCAN) IV Stopped (06/24/22 2013)   meropenem (MERREM) IV Stopped (06/24/22 1751)   TPN ADULT (ION) 90 mL/hr at 06/25/22 1100   TPN ADULT (ION)      Chlorhexidine Gluconate Cloth  6 each Topical Q0600   feeding supplement (VITAL 1.5 CAL)  1,000 mL Per Tube Q24H   free water  20 mL Per Tube Q4H   heparin injection (subcutaneous)  5,000 Units Subcutaneous Q8H   heparin sodium (porcine)       insulin aspart  0-15 Units Subcutaneous Q4H   multivitamin  1 tablet Per Tube QHS   mouth rinse  15 mL Mouth Rinse 4 times per day   pantoprazole  40 mg Intravenous  Q12H   sodium chloride flush  10-40 mL Intracatheter Q12H   sodium chloride, acetaminophen **OR** acetaminophen, alteplase, guaiFENesin-dextromethorphan, haloperidol lactate, heparin, heparin sodium (porcine), HYDROmorphone (DILAUDID) injection, hydrOXYzine, ipratropium-albuterol, lidocaine (PF), lidocaine-prilocaine, lip balm, mouth rinse, mouth rinse, oxyCODONE, pentafluoroprop-tetrafluoroeth, QUEtiapine, sodium chloride flush  Assessment/ Plan:  Xavier Esparza is a 76 y.o.  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  Normal kidney function prior to admission.  IV contrast exposure on 06/08/2022 and 06/09/2022.  Renal ultrasound negative for obstruction.  Urinalysis shows hematuria and mild proteinuria.   ?  Gross hematuria related to Foley catheter.  Patient has history of prostate cancer.   Received first dialysis treatment on June 16.   Patient has required CRRT starting June 22 and stopped on 06/24/22  Received hemodialysis yesterday, tolerated treatment well with low BFR and low UF. Plan to dialyze patient today with increased BFR. Will attempt to manage fluid with dialysis as patient tolerates. Next treatment scheduled for Monday.    Lab Results  Component Value Date   CREATININE 4.00 (H) 06/25/2022   CREATININE 3.20 (H) 06/24/2022   CREATININE 2.62 (H) 06/24/2022    Intake/Output Summary (Last 24 hours) at 06/25/2022 1149 Last data filed at 06/25/2022 1100 Gross per 24 hour  Intake  3374.22 ml  Output 1000 ml  Net 2374.22 ml    Acute pancreatitis, severe necrotizing.  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    Receiving Meropenem and antifungal therapy managed by primary team. Albumin twice daily. Remains on TPN   Diabetes mellitus type 2 without complication.Home regimen includes metformin.  Hemoglobin A1c 6.2 on 01/08/2022.  -Glucose elevated at times, nutrition requesting insulin be placed in TPN  Acute ileus, receiving IV narcotics for severe acute pancreatitis. Tube feeds stopped and NGT placed to LWIS. Rectal tube placed  EGD showed 3 gastric ulcers, no active bleeding noted.       LOS: [place] 6/30/202311:49 AM

## 2022-06-25 NOTE — Progress Notes (Signed)
                                                     Palliative Care Progress Note, Assessment & Plan   Patient Name: Xavier Esparza       Date: 06/25/2022 DOB: [birth year]  Age: 76 y.o. MRN#: [id number] Attending Physician: Lorella Nimrod, MD Primary Care Physician: Tonia Ghent, MD Admit Date: 06/08/2022  Reason for Consultation/Follow-up: Establishing goals of care  Subjective: Patient is lying in bed in no apparent distress.  He does not acknowledge my physical presence but will respond to my voice.  No family at bedside presently.  HPI: 76 y.o. male  with past medical history of prostate cancer, nephrolithiasis, BPH, diabetes type 2, and HTN admitted on 06/08/2022 with syncopal episode at home. Patient is being treated for severe necrotizing pancreatitis, portal vein thrombosis, and a liver mass with possible abscess.  Patient also has AKI, was on CRRT, and is now receiving intermittent HD.  Palliative medicine team was consulted to discuss goals of care.   Summary of counseling/coordination of care: After reviewing the patient's chart and assessing the patient at bedside, I spoke with patient's wife over the phone.  She shares she would like to give permission to have permacath so that patient can receive hemodialysis.    Therapeutic silence and active listening provided for wife to share her thoughts and emotions regarding patient's current health status.She remains hopeful for full recovery.  Patient's wife did not want to further discuss anything today.  She says that she is still at home since she became very dizzy at the hospital during her visit earlier today.  She plans to return to visit her husband this afternoon.  Goals are clear. Full code and full scope remain.  Palliative medicine team will continue to follow the patient throughout his  hospitalization.  Please contact PMT if goals change, if health status declines, or at patient/family's request.  Code Status: Full code  Prognosis: Unable to determine  Discharge Planning: To Be Determined  Care plan was discussed with patient, patient's wife  Physical Exam Vitals reviewed.  Constitutional:      Appearance: He is ill-appearing.  HENT:     Head: Normocephalic.     Mouth/Throat:     Mouth: Mucous membranes are moist.  Cardiovascular:     Rate and Rhythm: Normal rate.     Pulses: Normal pulses.  Pulmonary:     Effort: Pulmonary effort is normal.  Abdominal:     Palpations: Abdomen is soft.  Musculoskeletal:     Comments: Generalized weakness  Neurological:     Mental Status: He is alert.     Comments: Oriented to self             Palliative Assessment/Data: 30%    Total Time 25 minutes  Greater than 50%  of this time was spent counseling and coordinating care related to the above assessment and plan.  Thank you for allowing the Palliative Medicine Team to assist in the care of this patient.  [place] Ilsa Iha, FNP-BC Palliative Medicine Team Team Phone # [phone]

## 2022-06-25 NOTE — Progress Notes (Signed)
Treatment Date: 06/25/2022   Treatment Time: 2 hours 30 minutes   Access: Right Femoral CVC   UF Removed: 433 ml   Next Treatment: 06/28/2022   Note:  HD treatment completed. Tolerated well, no adverse effects noted or reported. Patient hemodynamically stable. Report given to floor nurse Jason Nest, RN.

## 2022-06-25 NOTE — Progress Notes (Signed)
Nutrition Follow-up  DOCUMENTATION CODES:   Obesity unspecified  INTERVENTION:   -TPN management per pharmacy -Continue TF via NGT:  Vital 1.5 @ 10 ml/hr and increase 10 ml/hr every 8 hours until goal of 65 ml/hr is reached  45 ml Prosource TF BID  20 ml free water flush every 4 hours to maintain tube patency  Regimen at goal rate provides 2420kcal/day, 127g/day protein and 1329m/day of free water    Rena-vit daily via tube   NUTRITION DIAGNOSIS:   Inadequate oral intake related to acute illness as evidenced by NPO status.  Ongoing   GOAL:   Patient will meet greater than or equal to 90% of their needs  Met with TPN  MONITOR:   Diet advancement, Labs, Weight trends, TF tolerance, Skin, I & O's  REASON FOR ASSESSMENT:   Consult Enteral/tube feeding initiation and management  ASSESSMENT:   76y/o male with h/o DM, BPH, prostate cancer, hiatal hernia, kidney stones and HTN who is admitted with severe necrotizing pancreatitis, portal vein thrombosis, liver lesions, AKI and sepsis.  - Pt s/p initiation of HD 6/16 -Pt s/p EGD 6/23; pt noted to have gastric and duodenal ulcers -Pt s/p TPN initiation 6/22  Reviewed I/O's: +1.7 L x 24 hours and +2.1 L since 06/11/22  Case discussed with RN, MD, and pharmacist. Trickle feeds started yesterday at 10 ml/hr. RD received permission to advance TF slowly to goal today.   Per discussion with pharmacist, plan to continue TPN but will decrease rate due to TF advancement and to help prevent fluid overload in the setting of HD. Pt currently receiving TPN @ 90 ml/hr, which provides 2499 kcals and 149 grams protein, meeting 100% of needs. TPN will be decreased to 70 ml/hr at 1800 and will provide 2241 kcals and 121 grams protein, meeting 100% of estimated kcal and protein needs.  Medications reviewed and include IV albumin.   Labs reviewed: CBGS: 1416-384(inpatient orders for glycemic control are 20 units regular insulin in TPN).     Diet Order:   Diet Order             Diet NPO time specified  Diet effective midnight           Diet - low sodium heart healthy                   EDUCATION NEEDS:   Education needs have been addressed  Skin:  Skin Assessment: Reviewed RN Assessment  Last BM:  06/25/22  Height:   Ht Readings from Last 1 Encounters:  06/10/22 6' (1.829 m)    Weight:   Wt Readings from Last 1 Encounters:  06/25/22 99.6 kg    Ideal Body Weight:  80.9 kg  BMI:  Body mass index is 29.78 kg/m.  Estimated Nutritional Needs:   Kcal:  2200-2500kcal/day  Protein:  120-135g/day  Fluid:  2.1-2.4Lday    JLoistine Chance RD, LDN, C[place]istered Dietitian II Certified Diabetes Care and Education Specialist Please refer to [facility]for RD and/or RD on-call/weekend/after hours pager

## 2022-06-25 NOTE — H&P (View-Only) (Signed)
[facility] Vascular Consult Note  MRN : [id number]  Xavier Esparza is a 76 y.o. (1946/03/19) male who presents with chief complaint of  Chief Complaint  Patient presents with   Loss of Consciousness  .   Consulting Physician: Donell Beers, NP Reason for consult: PermCath placement History of Present Illness: Xavier Esparza is a 76 year old male who presented to [facility] with severe pancreatitis.  Since his initial presentation the patient has developed acute kidney injury requiring CRRT.  We are asked to assist with placement of a PermCath.  The patient today is somewhat confused.  He is able to answer simple yes or no questions.  Current Facility-Administered Medications  Medication Dose Route Frequency Provider Last Rate Last Admin   0.9 %  sodium chloride infusion   Intravenous PRN Rust-Chester, Huel Cote, NP   Stopped at 06/19/22 2153   acetaminophen (TYLENOL) tablet 650 mg  650 mg Oral Q6H PRN Athena Masse, MD   650 mg at 06/20/22 2355   Or   acetaminophen (TYLENOL) suppository 650 mg  650 mg Rectal Q6H PRN Athena Masse, MD       albumin human 25 % solution 25 g  25 g Intravenous BID Colon Flattery, NP 60 mL/hr at 06/24/22 2141 25 g at 06/24/22 2141   alteplase (CATHFLO ACTIVASE) injection 2 mg  2 mg Intracatheter Once PRN Colon Flattery, NP       Chlorhexidine Gluconate Cloth 2 % PADS 6 each  6 each Topical Q0600 Ralene Muskrat B, MD   6 each at 06/24/22 0852   feeding supplement (VITAL 1.5 CAL) liquid 1,000 mL  1,000 mL Per Tube Q24H Lorella Nimrod, MD   Infusion Verify at 06/24/22 2100   fluconazole (DIFLUCAN) IVPB 400 mg  400 mg Intravenous Q24H Benita Gutter, [place] at 06/24/22 2013   free water 20 mL  20 mL Per Tube Q4H Lorella Nimrod, MD   20 mL at 06/25/22 0005   guaiFENesin-dextromethorphan (ROBITUSSIN DM) 100-10 MG/5ML syrup 15 mL  15 mL Per Tube Q4H PRN Ralene Muskrat B, MD       haloperidol lactate (HALDOL) injection 1 mg  1 mg  Intravenous Q6H PRN Loletha Grayer, MD   1 mg at 06/24/22 0746   heparin injection 1,000 Units  1,000 Units Dialysis PRN Colon Flattery, NP   3,600 Units at 06/24/22 1431   heparin injection 5,000 Units  5,000 Units Subcutaneous Q8H Flora Lipps, MD   5,000 Units at 06/24/22 2141   HYDROmorphone (DILAUDID) injection 1 mg  1 mg Intravenous Q1H PRN Flora Lipps, MD   1 mg at 06/24/22 1225   hydrOXYzine (ATARAX) tablet 10 mg  10 mg Per Tube TID PRN Ralene Muskrat B, MD   10 mg at 06/21/22 2108   insulin aspart (novoLOG) injection 0-9 Units  0-9 Units Subcutaneous Q4H Benita Gutter, RPH   2 Units at 06/25/22 0004   ipratropium-albuterol (DUONEB) 0.5-2.5 (3) MG/3ML nebulizer solution 3 mL  3 mL Nebulization Q4H PRN Sreenath, Sudheer B, MD       lidocaine (PF) (XYLOCAINE) 1 % injection 5 mL  5 mL Intradermal PRN Colon Flattery, NP       lidocaine-prilocaine (EMLA) cream 1 Application  1 Application Topical PRN Colon Flattery, NP       lip balm (BLISTEX) ointment   Topical PRN Loletha Grayer, MD   1 Application at 62/83/15 1730   meropenem (MERREM) 500 mg in sodium  chloride 0.9 % 100 mL IVPB  500 mg Intravenous q1800 Benita Gutter, [facility]   Stopped at 06/24/22 1751   multivitamin (RENA-VIT) tablet 1 tablet  1 tablet Per Tube QHS Ralene Muskrat B, MD   1 tablet at 06/24/22 2141   Oral care mouth rinse  15 mL Mouth Rinse PRN Ralene Muskrat B, MD   15 mL at 06/19/22 1730   Oral care mouth rinse  15 mL Mouth Rinse 4 times per day Loletha Grayer, MD   15 mL at 06/24/22 2146   Oral care mouth rinse  15 mL Mouth Rinse PRN Loletha Grayer, MD       oxyCODONE (Oxy IR/ROXICODONE) immediate release tablet 5 mg  5 mg Per Tube Q4H PRN Renda Rolls, RPH   5 mg at 06/21/22 0529   pantoprazole (PROTONIX) injection 40 mg  40 mg Intravenous Q12H Loletha Grayer, MD   40 mg at 06/24/22 2141   pentafluoroprop-tetrafluoroeth (GEBAUERS) aerosol 1 Application  1 Application Topical PRN Colon Flattery, NP       QUEtiapine (SEROQUEL) tablet 12.5 mg  12.5 mg Oral QHS PRN Loletha Grayer, MD   12.5 mg at 06/22/22 2107   sodium chloride flush (NS) 0.9 % injection 10-40 mL  10-40 mL Intracatheter Q12H Loletha Grayer, MD   10 mL at 06/24/22 2146   sodium chloride flush (NS) 0.9 % injection 10-40 mL  10-40 mL Intracatheter PRN Loletha Grayer, MD       TPN ADULT (ION)   Intravenous Continuous TPN Benita Gutter, RPH 90 mL/hr at 06/24/22 2100 Infusion Verify at 06/24/22 2100    Past Medical History:  Diagnosis Date   Adopted    Cataract    resolved with surgery   Diabetes mellitus    Dyslipidemia    Erectile dysfunction    Herpes labialis    Upper Lip   Hyperlipidemia    Male hypogonadism    Prostate cancer [facility])    Renal stones    Thyroid nodule    Right    Past Surgical History:  Procedure Laterality Date   CATARACT EXTRACTION, BILATERAL  2017   Dr. Katy Fitch   COLONOSCOPY WITH PROPOFOL N/A 03/09/2018   Procedure: COLONOSCOPY WITH PROPOFOL;  Surgeon: Lin Landsman, MD;  Location: [place];  Service: Gastroenterology;  Laterality: N/A;   COLONOSCOPY WITH PROPOFOL N/A 03/12/2021   Procedure: COLONOSCOPY WITH PROPOFOL;  Surgeon: Lin Landsman, MD;  Location: [place];  Service: Endoscopy;  Laterality: N/A;  priority 4   CYSTOSCOPY W/ URETERAL STENT PLACEMENT  08/09/2017   Procedure: CYSTOSCOPY WITH RETROGRADE PYELOGRAM/URETERAL STENT PLACEMENT;  Surgeon: Royston Cowper, MD;  Location: [facility];  Service: Urology;;   ESOPHAGOGASTRODUODENOSCOPY (EGD) WITH PROPOFOL N/A 06/18/2022   Procedure: ESOPHAGOGASTRODUODENOSCOPY (EGD) WITH PROPOFOL;  Surgeon: Jonathon Bellows, MD;  Location: [facility];  Service: Gastroenterology;  Laterality: N/A;   EXTRACORPOREAL SHOCK WAVE LITHOTRIPSY Left 09/15/2017   Procedure: EXTRACORPOREAL SHOCK WAVE LITHOTRIPSY (ESWL);  Surgeon: Royston Cowper, MD;  Location: [facility];  Service: Urology;  Laterality: Left;   EYE  SURGERY Bilateral 2016   cataract extractions   GREEN LIGHT LASER TURP (TRANSURETHRAL RESECTION OF PROSTATE N/A 04/20/2016   Procedure: GREEN LIGHT LASER TURP (TRANSURETHRAL RESECTION OF PROSTATE;  Surgeon: Royston Cowper, MD;  Location: [facility];  Service: Urology;  Laterality: N/A;   INGUINAL HERNIA REPAIR  01/11/2012   Procedure: HERNIA REPAIR INGUINAL ADULT;  Surgeon: Pedro Earls, MD;  Location: [facility]  [place];  Service: General;  Laterality: Right;  Open right inguinal hernia repair with mesh   POLYPECTOMY  03/12/2021   Procedure: POLYPECTOMY;  Surgeon: Lin Landsman, MD;  Location: [place];  Service: Endoscopy;;   RADIOACTIVE SEED IMPLANT     PROSTATE   URETEROSCOPY WITH HOLMIUM LASER LITHOTRIPSY Right 08/09/2017   Procedure: URETEROSCOPY WITH HOLMIUM LASER LITHOTRIPSY;  Surgeon: Royston Cowper, MD;  Location: [facility];  Service: Urology;  Laterality: Right;    Social History Social History   Tobacco Use   Smoking status: Former    Years: 30.00    Types: Cigarettes    Quit date: 12/27/1990    Years since quitting: 31.5   Smokeless tobacco: Never  Vaping Use   Vaping Use: Never used  Substance Use Topics   Alcohol use: Yes    Comment: Rare Socially   Drug use: No    Family History Family History  Adopted: Yes    No Known Allergies   REVIEW OF SYSTEMS (Negative unless checked)  Constitutional: '[]'$ Weight loss  '[]'$ Fever  '[]'$ Chills Cardiac: '[]'$ Chest pain   '[]'$ Chest pressure   '[]'$ Palpitations   '[]'$ Shortness of breath when laying flat   '[]'$ Shortness of breath at rest   '[]'$ Shortness of breath with exertion. Vascular:  '[]'$ Pain in legs with walking   '[]'$ Pain in legs at rest   '[]'$ Pain in legs when laying flat   '[]'$ Claudication   '[]'$ Pain in feet when walking  '[]'$ Pain in feet at rest  '[]'$ Pain in feet when laying flat   '[]'$ History of DVT   '[]'$ Phlebitis   '[]'$ Swelling in legs   '[]'$ Varicose veins   '[]'$ Non-healing ulcers Pulmonary:   '[]'$ Uses home oxygen   '[]'$ Productive cough    '[]'$ Hemoptysis   '[]'$ Wheeze  '[]'$ COPD   '[]'$ Asthma Neurologic:  '[]'$ Dizziness  '[]'$ Blackouts   '[]'$ Seizures   '[]'$ History of stroke   '[]'$ History of TIA  '[]'$ Aphasia   '[]'$ Temporary blindness   '[]'$ Dysphagia   '[]'$ Weakness or numbness in arms   '[]'$ Weakness or numbness in legs Musculoskeletal:  '[]'$ Arthritis   '[]'$ Joint swelling   '[]'$ Joint pain   '[]'$ Low back pain Hematologic:  '[]'$ Easy bruising  '[]'$ Easy bleeding   '[]'$ Hypercoagulable state   '[]'$ Anemic  '[]'$ Hepatitis Gastrointestinal:  '[]'$ Blood in stool   '[]'$ Vomiting blood  '[]'$ Gastroesophageal reflux/heartburn   '[]'$ Difficulty swallowing. Genitourinary:  '[]'$ Chronic kidney disease   '[]'$ Difficult urination  '[]'$ Frequent urination  '[]'$ Burning with urination   '[]'$ Blood in urine Skin:  '[]'$ Rashes   '[]'$ Ulcers   '[]'$ Wounds Psychological:  '[]'$ History of anxiety   '[]'$  History of major depression.  Physical Examination  Vitals:   06/24/22 1830 06/24/22 1845 06/24/22 1900 06/24/22 1915  BP: 109/90 114/77 (!) 129/94 (!) 123/93  Pulse: 97   94  Resp: (!) 28 (!) 23 (!) 29 (!) 23  Temp:    99.6 F (37.6 C)  TempSrc:    Oral  SpO2: 100% 99% 99% 99%  Weight:      Height:       Body mass index is 28.97 kg/m. Gen:  WD/WN, NAD confused Head: /AT, No temporalis wasting. Prominent temp pulse not noted. Ear/Nose/Throat: Hearing grossly intact, nares w/o erythema or drainage, oropharynx w/o Erythema/Exudate Eyes: Sclera non-icteric, conjunctiva clear Neck: Trachea midline.  No JVD.  Pulmonary:  Good air movement, respirations not labored, equal bilaterally.  Cardiac: RRR, normal S1, S2. Vascular: Minimal peripheral edema  Gastrointestinal: soft, non-tender/non-distended. No guarding/reflex.  Musculoskeletal: M/S 5/5 throughout.  Extremities without ischemic changes.  No deformity or atrophy. No  edema. Neurologic: Sensation grossly intact in extremities.  Symmetrical.  Speech is fluent. Motor exam as listed above. Psychiatric: Judgment intact, Mood & affect appropriate for pt's clinical  situation. Dermatologic: No rashes or ulcers noted.  No cellulitis or open wounds. Lymph : No Cervical, Axillary, or Inguinal lymphadenopathy.    CBC Lab Results  Component Value Date   WBC 25.8 (H) 06/24/2022   HGB 7.6 (L) 06/24/2022   HCT 23.4 (L) 06/24/2022   MCV 85.4 06/24/2022   PLT 175 06/24/2022    BMET    Component Value Date/Time   NA 136 06/24/2022 1949   K 3.8 06/24/2022 1949   CL 99 06/24/2022 1949   CO2 25 06/24/2022 1949   GLUCOSE 160 (H) 06/24/2022 1949   BUN 48 (H) 06/24/2022 1949   CREATININE 3.20 (H) 06/24/2022 1949   CALCIUM 8.1 (L) 06/24/2022 1949   GFRNONAA 19 (L) 06/24/2022 1949   GFRAA >60 04/16/2016 0946   Estimated Creatinine Clearance: 23.3 mL/min (A) (by C-G formula based on SCr of 3.2 mg/dL (H)).  COAG Lab Results  Component Value Date   INR 1.5 (H) 06/12/2022   INR 1.1 06/08/2022    Radiology US Abdomen Limited RUQ (LIVER/GB)  Result Date: 06/24/2022 CLINICAL DATA:  Liver abscess EXAM: ULTRASOUND ABDOMEN LIMITED RIGHT UPPER QUADRANT COMPARISON:  CT 06/18/2022, 06/11/2022, MRI 06/08/2022 FINDINGS: Gallbladder: No gallstones or wall thickening visualized. No sonographic Murphy sign noted by sonographer. Common bile duct: Diameter: 6.1 mm Liver: Heterogenous echotexture. Cyst in the right posterior hepatic lobe measuring 3.2 x 5.2 x 2.9 cm. Multiple additional cysts within the liver, including septated cyst in the left hepatic lobe measuring 3.7 x 4.3 x 4 cm. No discrete abscess collection to correspond to the questioned area on CT and MRI. Portal vein is patent on color Doppler imaging with normal direction of blood flow towards the liver. Other: None. IMPRESSION: 1. No definite drainable abscess collection by ultrasound to correspond to the areas seen on CT and MRI. 2. There are multiple liver cysts both simple and complex, see MRI 06/08/2022. Electronically Signed   By: Donavan Foil M.D.   On: 06/24/2022 16:47   DG Chest Port 1 View  Result  Date: 06/24/2022 CLINICAL DATA:  Abdominal distension and hypoxia. EXAM: PORTABLE CHEST 1 VIEW COMPARISON:  Chest radiograph 06/14/2022 and abdominal CT 06/18/2022 FINDINGS: Nasogastric tube extends into the abdomen. Right arm PICC line tip terminates in the SVC region. Again noted are slightly low lung volumes. Improved aeration at the right lung base compared to the previous chest radiographic examination. Few densities along the medial right lung base are suggestive for atelectasis. Otherwise, no significant airspace disease or consolidation. Heart size is normal. IMPRESSION: Residual densities at the medial right lung base probably represent atelectasis. Otherwise, no acute chest findings. Electronically Signed   By: Markus Daft M.D.   On: 06/24/2022 09:12   DG Abd 1 View  Result Date: 06/24/2022 CLINICAL DATA:  Abdominal distension and hypoxia. EXAM: ABDOMEN - 1 VIEW COMPARISON:  06/21/2022 FINDINGS: There is a right femoral dialysis catheter with the tip in the lower IVC region. Brachytherapy seeds in the prostate. Bowel gas throughout the abdomen and pelvis. There is evidence for gas in the stomach, small bowel and colon. There is a nasogastric tube which terminates in the stomach body region. Limited evaluation for free air on the supine images. IMPRESSION: 1. Mild gaseous distension throughout the abdomen and pelvis. Findings are nonspecific but could represent an ileus pattern.  2. Right femoral dialysis catheter with the tip in the lower IVC region. Electronically Signed   By: Markus Daft M.D.   On: 06/24/2022 09:06   DG Abd 1 View  Result Date: 06/21/2022 CLINICAL DATA:  Concern for ileus EXAM: ABDOMEN - 1 VIEW COMPARISON:  CT 06/18/2022 FINDINGS: Diffuse gaseous distension of bowel in a nonobstructive pattern. Nasogastric tube tip and side port overlie the stomach. IMPRESSION: Diffuse gaseous distention of bowel compatible with ileus. Electronically Signed   By: Maurine Simmering M.D.   On: 06/21/2022  10:35   CT ABDOMEN PELVIS WO CONTRAST  Result Date: 06/18/2022 CLINICAL DATA:  Acute severe pancreatitis EXAM: CT ABDOMEN AND PELVIS WITHOUT CONTRAST TECHNIQUE: Multidetector CT imaging of the abdomen and pelvis was performed following the standard protocol without IV contrast. RADIATION DOSE REDUCTION: This exam was performed according to the departmental dose-optimization program which includes automated exposure control, adjustment of the mA and/or kV according to patient size and/or use of iterative reconstruction technique. COMPARISON:  None Available. FINDINGS: Lower chest: Bibasilar effusions and passive atelectasis not changed. No pericardial fluid. Hepatobiliary: Segmental hypodensities in the medial RIGHT hepatic lobe remain concerning for hepatic abscess. Additional low-density hepatic cysts again noted. No change. Gallbladder unchanged. Pancreas: There is interval increase in edema inflammation of the pancreas poor definition of the border. There is a lesion of low attenuation within the mid body pancreas measuring 4 cm x 2.4 cm (35/2 which is poorly defined but appears new from prior. No clear organized fluid collections. There is interval increase in the omental mesenteric thickening associated with acute pancreatitis. Suggestion of multiple small nodules within the omentum and retroperitoneum associated with the fluid collections. For example in Morrison's pouch multiple small nodules. Representative hazy thickening along the anterior LEFT and RIGHT pararenal fascia. Injection of the retroperitoneal fat within the pararenal space and deep the pararenal space on the RIGHT for example (image 50/2). Increased thickening along the LEFT pararenal space (image 39/2). No organized fluid collections. Spleen: Normal spleen Adrenals/urinary tract: Adrenal glands and kidneys normal. Nonobstructing calculus are RIGHT kidney. Foley catheter in bladder. Stomach/Bowel: NG tube in stomach. Duodenum small bowel  grossly normal. Appendix normal. Multiple diverticula of the descending colon and sigmoid colon without acute inflammation. Fluid stool in the rectum. Rectal tube in place Vascular/Lymphatic: Abdominal aorta is normal caliber. No periportal or retroperitoneal adenopathy. No pelvic adenopathy. Reproductive: Unremarkable Other: No free fluid. Musculoskeletal: No aggressive osseous lesion. IMPRESSION: 1. Increased nodularity associated with the omental inflammation and inflammatory thickening along the retroperitoneal perirenal space. This nodularity suggest progression of pancreatic inflammation versus potentially pancreatic adenocarcinoma metastasis. Recommend correlation with appropriate tumor markers. 2. Pancreas is less well-defined. There is a low-density region within the mid body the pancreas. No clear organized fluid collections are present. 3. Cluster of low-attenuation lesion in the RIGHT hepatic lobe could represent hepatic abscess. Cannot exclude malignancy. Electronically Signed   By: Suzy Bouchard M.D.   On: 06/18/2022 13:56   CT HEAD WO CONTRAST (5MM)  Result Date: 06/18/2022 CLINICAL DATA:  Provided history: Delirium. EXAM: CT HEAD WITHOUT CONTRAST TECHNIQUE: Contiguous axial images were obtained from the base of the skull through the vertex without intravenous contrast. RADIATION DOSE REDUCTION: This exam was performed according to the departmental dose-optimization program which includes automated exposure control, adjustment of the mA and/or kV according to patient size and/or use of iterative reconstruction technique. COMPARISON:  No pertinent prior exams available for comparison. FINDINGS: Brain: Mild cerebral atrophy. There is no  acute intracranial hemorrhage. No demarcated cortical infarct. No extra-axial fluid collection. No evidence of an intracranial mass. No midline shift. Vascular: No hyperdense vessel. Atherosclerotic calcifications. Skull: No fracture or aggressive osseous lesion.  Sinuses/Orbits: No mass or acute finding within the imaged orbits. No significant paranasal sinus disease at the imaged levels. IMPRESSION: No evidence of acute intracranial abnormality. Mild cerebral atrophy. Electronically Signed   By: Kellie Simmering D.O.   On: 06/18/2022 13:37   [place] EKG SITE RITE  Result Date: 06/17/2022 If [facility] image not attached, placement could not be confirmed due to current cardiac rhythm.  DG Abd 1 View  Result Date: 06/14/2022 CLINICAL DATA:  [id number].  NG tube placement EXAM: ABDOMEN - 1 VIEW COMPARISON:  X-ray abdomen 06/14/2022 2:37 p.m. FINDINGS: Interval removal of an enteric tube. Interval placement of a new enteric tube with tip and side port overlying the gastric lumen. Persistent gaseous dilatation of the small and large bowel. Colonic diverticulosis. No radio-opaque calculi or other significant radiographic abnormality are seen. IMPRESSION: 1. Interval removal of an enteric tube. Interval placement of a new enteric tube with tip and side port overlying the gastric lumen. 2. Postop ileus. Electronically Signed   By: Iven Finn M.D.   On: 06/14/2022 16:56   DG Abd 1 View  Result Date: 06/14/2022 CLINICAL DATA:  Ileus EXAM: ABDOMEN - 1 VIEW COMPARISON:  None Available. FINDINGS: Feeding tube tip overlies the fourth portion of the duodenum. There is diffuse mild gaseous distension of bowel in a nonobstructive pattern. IMPRESSION: Mild diffuse gaseous distension foot of bowel, compatible with ileus. Electronically Signed   By: Maurine Simmering M.D.   On: 06/14/2022 15:22   [place] ASCITES (ABDOMEN LIMITED)  Result Date: 06/14/2022 CLINICAL DATA:  Evaluate for ascites EXAM: LIMITED ABDOMEN ULTRASOUND FOR ASCITES TECHNIQUE: Limited ultrasound survey for ascites was performed in all four abdominal quadrants. COMPARISON:  None Available. FINDINGS: There is no sonographic evidence of ascites in all 4 quadrants of abdomen. IMPRESSION: There is no demonstrable ascites.  Electronically Signed   By: Elmer Picker M.D.   On: 06/14/2022 08:57   DG Chest Port 1 View  Result Date: 06/14/2022 CLINICAL DATA:  Shortness of breath. EXAM: PORTABLE CHEST 1 VIEW COMPARISON:  June 11, 2022. FINDINGS: Stable cardiomediastinal silhouette. Feeding tube is seen entering stomach. Hypoinflation of the lungs is noted with mild bibasilar subsegmental atelectasis. Minimal left pleural effusion is noted. Bony thorax is unremarkable. IMPRESSION: Hypoinflation of the lungs is noted with mild bibasilar subsegmental atelectasis. Minimal left pleural effusion. Electronically Signed   By: Marijo Conception M.D.   On: 06/14/2022 08:11   DG Chest Port 1 View  Addendum Date: 06/11/2022   ADDENDUM REPORT: 06/11/2022 17:02 ADDENDUM: Impression #1 called to Dr. Milon Dikes of the ICU at 2:58 p.m. on 06/11/2022. Electronically Signed   By: Kellie Simmering D.O.   On: 06/11/2022 17:02   Result Date: 06/11/2022 CLINICAL DATA:  Provided history: Encounter for central line and NG tube placement. EXAM: PORTABLE CHEST 1 VIEW COMPARISON:  Prior chest radiographs 06/08/2022 and earlier. Same day CT abdomen/pelvis 06/11/2022. FINDINGS: Left IJ approach central venous catheter terminating in the expected location of the brachiocephalic/SVC confluence. An enteric tube is coiled at the level of the lower neck and terminates at the level of the distal esophagus. Shallow inspiration radiograph, accentuating the cardiac silhouette and limiting evaluation of heart size. Small bilateral pleural effusions better appreciated on same day CT abdomen/pelvis. Mild bibasilar atelectasis. No evidence of  pneumothorax. No acute bony abnormality identified. Degenerative changes of the left glenohumeral joint. Attempts are being made to reach the ordering provider at this time regarding impression #1. IMPRESSION: An enteric tube is coiled at the level of the lower neck and terminates at the level of the distal esophagus. Left IJ approach  central venous catheter terminating in the expected location of the brachiocephalic/SVC confluence. Shallow inspiration radiograph with small bilateral pleural effusions and bibasilar atelectasis. Electronically Signed: By: Kellie Simmering D.O. On: 06/11/2022 14:34   DG Naso G Tube Plc W/Fl W/Rad  Result Date: 06/11/2022 CLINICAL DATA:  History of prostate cancer, sepsis, abdominal pain, pancreatitis EXAM: NASO G TUBE PLACEMENT WITH FL AND WITH RAD CONTRAST:  5 mL Omnipaque 300 FLUOROSCOPY: Radiation Exposure Index (if provided by the fluoroscopic device): 41.4 mGy Number of Acquired Spot Images: 0 COMPARISON:  None Available. FINDINGS: Dobbhoff tube was inserted through the right nostril and advanced into the stomach. Dobbhoff tube was foot the further advanced into the fourth portion of the duodenum. Stylet was removed and tube was secured to the skin surface of the nose. No immediate complications.  Incidentally noted small hiatal hernia. IMPRESSION: Successful placement of a Dobbhoff tube with the tip located distal to the pylorus in the fourth portion of the duodenum. Electronically Signed   By: Kathreen Devoid M.D.   On: 06/11/2022 15:20   CT ABDOMEN PELVIS WO CONTRAST  Result Date: 06/11/2022 CLINICAL DATA:  Sepsis, abdominal pain. History of prostate cancer. History of renal calculi. Syncope. Pancreatitis with focal pancreatic necrosis on 06/08/2022 MRI. 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 EXAM: CT ABDOMEN AND PELVIS WITHOUT CONTRAST TECHNIQUE: Multidetector CT imaging of the abdomen and pelvis was performed following the standard protocol without IV contrast. RADIATION DOSE REDUCTION: This exam was performed according to the departmental dose-optimization program which includes automated exposure control, adjustment of the mA and/or kV according to patient size and/or use of iterative reconstruction technique. COMPARISON:  Multiple exams, including MRI  06/09/2022 and 06/08/2022 FINDINGS: Lower chest: Small but increased bilateral pleural effusions with passive atelectasis. Coronary artery atherosclerotic calcifications. Hepatobiliary: Heterogeneous density in the right hepatic lobe corresponding to the prior region of portal vein thrombosis and suspected micro abscesses. Scattered hepatic fluid density lesions compatible with cysts throughout the rest of the liver. High density material in the gallbladder, possibly sludge. Probable sludge in the nondilated common bile duct. Pancreas: Substantial peripancreatic edema. No gas density is identified within the pancreatic parenchyma or previous regions of pancreatic necrosis to indicate super infection with gas-forming organism. Acute peripancreatic fluid collections appear increased and there is increased surrounding mesenteric edema and fluid tracking along the paracolic gutters and anterior margins the perirenal fascia. Spleen: Unremarkable Adrenals/Urinary Tract: Two nonobstructive right renal calculi are observed, the larger measuring 0.7 cm in the right kidney lower pole on image 68 series 5. Benign 4.4 cm left kidney lower pole cyst. There is substantial contrast medium in the renal parenchyma bilaterally on today's noncontrast examination, indicating persistent nephrograms compatible with acute renal failure. This is likely left over from prior contrast enhanced studies from 06/08/2022 and possibly 06/09/2022. Correlate with urine production. Stomach/Bowel: Small type 1 hiatal hernia. Indistinct descending and transverse duodenum likely from secondary inflammation. Scattered colonic diverticula. Descending and sigmoid colon diverticulosis. Vascular/Lymphatic: Atherosclerosis is present, including aortoiliac atherosclerotic disease. Reproductive: Brachytherapy seed implants in the prostate gland. Other: Increased ascites likely related to the pancreatitis. There is also increasing presacral  edema.  Musculoskeletal: Lumbar spondylosis and degenerative disc disease causing multilevel impingement. IMPRESSION: 1. Increased ascites and acute peripancreatic fluid collections. No gas is identified within the pancreatic parenchyma to suggest superinfection of the prior pancreatic necrosis with gas-forming organism at this time. Today's exam is performed without IV contrast and accordingly assessment for further pancreatic necrosis is not feasible. 2. Delayed nephrogram with contrast in the kidneys left over from the studies from 2-3 days ago. Appearance compatible with acute renal failure. 3. Small but increased bilateral pleural effusions with passive atelectasis. 4. 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 5. Two nonobstructive right renal calculi are observed. 6. Other imaging findings of potential clinical significance: Aortic Atherosclerosis (ICD10-I70.0). Brachytherapy seed implants in the prostate gland. Multilevel lumbar impingement. Small type 1 hiatal hernia. Descending and sigmoid colon diverticulosis. Secondary inflammation of the descending and transverse duodenum. Electronically Signed   By: Van Clines M.D.   On: 06/11/2022 09:34   US RENAL  Result Date: 06/10/2022 CLINICAL DATA:  Acute kidney injury EXAM: RENAL / URINARY TRACT ULTRASOUND COMPLETE COMPARISON:  No prior ultrasound, correlation is made with CT abdomen pelvis and MRI abdomen 06/08/2022 FINDINGS: Right Kidney: Renal measurements: 11.6 x 5.6 x 5.2 cm = volume: 172 mL. Echogenicity within normal limits. No mass or hydronephrosis visualized. Left Kidney: Renal measurements: 12.9 x 5.8 x 5.1 cm = volume: 201 mL. Echogenicity within normal limits. No hydronephrosis visualized. Cystic lesion in the mid left kidney measures up to 4.4 x 4.7 x 5.1 cm, consistent with a simple cyst, as seen on the 06/08/2022 CT  and MRI Bladder: Appears normal for degree of bladder distention. Other: Small amount of ascites. IMPRESSION: 1. No acute finding in the kidneys. 2. Small volume ascites. Electronically Signed   By: Merilyn Baba M.D.   On: 06/10/2022 12:21   ECHOCARDIOGRAM COMPLETE  Result Date: 06/09/2022    ECHOCARDIOGRAM REPORT   Patient Name:   PAXTYN BOYAR Date of Exam: 06/09/2022 Medical Rec #:  [id number]    Height:       72.0 in Accession #:    [id number]   Weight:       213.0 lb Date of Birth:  Dec 22, 1946    BSA:          2.188 m Patient Age:    23 years     BP:           139/86 mmHg Patient Gender: M            HR:           74 bpm. Exam Location:  [facility] Procedure: 2D Echo, Color Doppler and Cardiac Doppler Indications:     R55 Syncope  History:         Patient has no prior history of Echocardiogram examinations.                  Risk Factors:Diabetes and Dyslipidemia.  Sonographer:     Charmayne Sheer Referring Phys:  [id number] Athena Masse Diagnosing Phys: Kate Sable MD  Sonographer Comments: Suboptimal apical window and suboptimal subcostal window. IMPRESSIONS  1. Left ventricular ejection fraction, by estimation, is 60 to 65%. The left ventricle has normal function. The left ventricle has no regional wall motion abnormalities. There is mild left ventricular hypertrophy. Left ventricular diastolic parameters are consistent with Grade I diastolic dysfunction (impaired relaxation).  2. Right ventricular systolic  function is normal. The right ventricular size is normal.  3. The mitral valve is normal in structure. Mild mitral valve regurgitation.  4. The aortic valve is tricuspid. Aortic valve regurgitation is not visualized. Aortic valve sclerosis is present, with no evidence of aortic valve stenosis.  5. Aortic dilatation noted. There is borderline dilatation of the aortic root, measuring 38 mm.  6. The inferior vena cava is normal in size with greater than 50% respiratory variability, suggesting right atrial  pressure of 3 mmHg. FINDINGS  Left Ventricle: Left ventricular ejection fraction, by estimation, is 60 to 65%. The left ventricle has normal function. The left ventricle has no regional wall motion abnormalities. The left ventricular internal cavity size was normal in size. There is  mild left ventricular hypertrophy. Left ventricular diastolic parameters are consistent with Grade I diastolic dysfunction (impaired relaxation). Right Ventricle: The right ventricular size is normal. No increase in right ventricular wall thickness. Right ventricular systolic function is normal. Left Atrium: Left atrial size was normal in size. Right Atrium: Right atrial size was normal in size. Pericardium: There is no evidence of pericardial effusion. Mitral Valve: The mitral valve is normal in structure. Mild mitral valve regurgitation. MV peak gradient, 4.0 mmHg. The mean mitral valve gradient is 1.0 mmHg. Tricuspid Valve: The tricuspid valve is normal in structure. Tricuspid valve regurgitation is trivial. Aortic Valve: The aortic valve is tricuspid. Aortic valve regurgitation is not visualized. Aortic valve sclerosis is present, with no evidence of aortic valve stenosis. Aortic valve mean gradient measures 3.0 mmHg. Aortic valve peak gradient measures 7.2  mmHg. Aortic valve area, by VTI measures 2.69 cm. Pulmonic Valve: The pulmonic valve was not well visualized. Pulmonic valve regurgitation is not visualized. Aorta: Aortic dilatation noted. There is borderline dilatation of the aortic root, measuring 38 mm. Venous: The inferior vena cava is normal in size with greater than 50% respiratory variability, suggesting right atrial pressure of 3 mmHg. IAS/Shunts: No atrial level shunt detected by color flow Doppler.  LEFT VENTRICLE PLAX 2D LVIDd:         4.48 cm   Diastology LVIDs:         3.25 cm   LV e' medial:    6.42 cm/s LV PW:         1.07 cm   LV E/e' medial:  6.8 LV IVS:        1.01 cm   LV e' lateral:   7.83 cm/s LVOT diam:      2.20 cm   LV E/e' lateral: 5.6 LV SV:         53 LV SV Index:   24 LVOT Area:     3.80 cm  RIGHT VENTRICLE RV Basal diam:  2.30 cm LEFT ATRIUM             Index        RIGHT ATRIUM           Index LA diam:        3.90 cm 1.78 cm/m   RA Area:     11.40 cm LA Vol (A2C):   31.8 ml 14.53 ml/m  RA Volume:   21.60 ml  9.87 ml/m LA Vol (A4C):   30.7 ml 14.03 ml/m LA Biplane Vol: 32.9 ml 15.03 ml/m  AORTIC VALVE                    PULMONIC VALVE AV Area (Vmax):    2.74 cm  PV Vmax:       0.98 m/s AV Area (Vmean):   2.84 cm     PV Vmean:      70.200 cm/s AV Area (VTI):     2.69 cm     PV VTI:        0.136 m AV Vmax:           134.00 cm/s  PV Peak grad:  3.9 mmHg AV Vmean:          84.600 cm/s  PV Mean grad:  2.0 mmHg AV VTI:            0.198 m AV Peak Grad:      7.2 mmHg AV Mean Grad:      3.0 mmHg LVOT Vmax:         96.60 cm/s LVOT Vmean:        63.300 cm/s LVOT VTI:          0.140 m LVOT/AV VTI ratio: 0.71  AORTA Ao Root diam: 3.80 cm MITRAL VALVE MV Area (PHT): 2.25 cm    SHUNTS MV Area VTI:   2.66 cm    Systemic VTI:  0.14 m MV Peak grad:  4.0 mmHg    Systemic Diam: 2.20 cm MV Mean grad:  1.0 mmHg MV Vmax:       1.00 m/s MV Vmean:      52.6 cm/s MV Decel Time: 337 msec MV E velocity: 43.70 cm/s MV A velocity: 97.30 cm/s MV E/A ratio:  0.45 Kate Sable MD Electronically signed by Kate Sable MD Signature Date/Time: 06/09/2022/6:43:28 PM    Final    MR ABDOMEN MRCP WO CONTRAST  Result Date: 06/09/2022 CLINICAL DATA:  A 76 year old male presents for follow-up of imaging that was acquired earlier on the same date to assess the biliary tree in the setting of acute necrotic pancreatitis. Please see earlier MRI for further details regarding pancreatitis and associated findings. EXAM: MRI ABDOMEN WITHOUT CONTRAST  (INCLUDING MRCP) TECHNIQUE: Multiplanar multisequence MR imaging of the abdomen was performed. Heavily T2-weighted images of the biliary and pancreatic ducts were obtained, and  three-dimensional MRCP images were rendered by post processing. COMPARISON:  MRI of the same date for which this study was done to complete the evaluation of the biliary tree. FINDINGS: Lower chest: Effusions partially visualized in the lower chest. Not well assessed. Hepatobiliary: Hepatic findings with hepatic lesions and portal venous thrombosis as well as hepatic cysts better outlined on previous MRI evaluation. There is sludge in the gallbladder. There is gallbladder wall edema as outlined on previous imaging. There is no discrete evidence of choledocholithiasis. The biliary tree is nondilated. The pancreatic duct is nondilated. Pancreas: Pancreatic edema as seen on recent imaging better displayed on recent MR evaluation performed earlier the same day. Spleen:  Not well evaluated better evaluated on previous imaging. Adrenals/Urinary Tract: Similarly with limited assessment on this dedicated MRCP. Stomach/Bowel: Edema of the stomach and adjacent small bowel also better displayed on recent dedicated abdominal MRI. Vascular/Lymphatic:  Not well evaluated Other: Extensive edema throughout the upper abdomen with signs of perihepatic ascites. Musculoskeletal: Not well evaluated IMPRESSION: No signs of choledocholithiasis without dilation of the biliary tree. See dedicated abdominal MRI for further details regarding liver and pancreas as well as associated findings of acute necrotic pancreatitis. Electronically Signed   By: Zetta Bills M.D.   On: 06/09/2022 15:54   MR 3D Recon At Scanner  Result Date: 06/09/2022 CLINICAL DATA:  A 76 year old male presents  for follow-up of imaging that was acquired earlier on the same date to assess the biliary tree in the setting of acute necrotic pancreatitis. Please see earlier MRI for further details regarding pancreatitis and associated findings. EXAM: MRI ABDOMEN WITHOUT CONTRAST  (INCLUDING MRCP) TECHNIQUE: Multiplanar multisequence MR imaging of the abdomen was  performed. Heavily T2-weighted images of the biliary and pancreatic ducts were obtained, and three-dimensional MRCP images were rendered by post processing. COMPARISON:  MRI of the same date for which this study was done to complete the evaluation of the biliary tree. FINDINGS: Lower chest: Effusions partially visualized in the lower chest. Not well assessed. Hepatobiliary: Hepatic findings with hepatic lesions and portal venous thrombosis as well as hepatic cysts better outlined on previous MRI evaluation. There is sludge in the gallbladder. There is gallbladder wall edema as outlined on previous imaging. There is no discrete evidence of choledocholithiasis. The biliary tree is nondilated. The pancreatic duct is nondilated. Pancreas: Pancreatic edema as seen on recent imaging better displayed on recent MR evaluation performed earlier the same day. Spleen:  Not well evaluated better evaluated on previous imaging. Adrenals/Urinary Tract: Similarly with limited assessment on this dedicated MRCP. Stomach/Bowel: Edema of the stomach and adjacent small bowel also better displayed on recent dedicated abdominal MRI. Vascular/Lymphatic:  Not well evaluated Other: Extensive edema throughout the upper abdomen with signs of perihepatic ascites. Musculoskeletal: Not well evaluated IMPRESSION: No signs of choledocholithiasis without dilation of the biliary tree. See dedicated abdominal MRI for further details regarding liver and pancreas as well as associated findings of acute necrotic pancreatitis. Electronically Signed   By: Zetta Bills M.D.   On: 06/09/2022 15:54   NM Pulmonary Perfusion  Result Date: 06/09/2022 CLINICAL DATA:  Chest pain EXAM: NUCLEAR MEDICINE PERFUSION LUNG SCAN TECHNIQUE: Perfusion images were obtained in multiple projections after intravenous injection of radiopharmaceutical. Ventilation scans intentionally deferred if perfusion scan and chest x-ray adequate for interpretation during COVID 19  epidemic. RADIOPHARMACEUTICALS:  3.96 mCi Tc-67mMAA IV COMPARISON:  Chest x-ray dated June 08, 2022 FINDINGS: No large segmental perfusion defects. Small subsegmental perfusion defects seen in the right lung. IMPRESSION: Nondiagnostic (low or intermediate probability) Electronically Signed   By: LYetta GlassmanM.D.   On: 06/09/2022 14:25   MR ABDOMEN W WO CONTRAST  Result Date: 06/09/2022 CLINICAL DATA:  Abnormalities on recent CT evaluation. EXAM: MRI ABDOMEN WITHOUT AND WITH CONTRAST TECHNIQUE: Multiplanar multisequence MR imaging of the abdomen was performed both before and after the administration of intravenous contrast. CONTRAST:  110mGADAVIST GADOBUTROL 1 MMOL/ML IV SOLN COMPARISON:  Comparison made with CT evaluation of January 08, 2022. FINDINGS: Lower chest: Incidental imaging of the lung bases without effusion or consolidation. Hepatobiliary: Multi cystic appearing area in the RIGHT hepatic lobe within the distribution of portal venous thrombus showing variable perfusion in this area measuring 9.5 x 6.4 cm (image 33/13) portal venous thrombus to RIGHT superior and anterior portal venous branches subtending hepatic subsegment VIII and V. there are areas of intrinsic T1 hyperintensity that surround this lesion and are interspersed throughout the lesion. A second hypovascular lesion is demonstrated in the posterior LEFT hemiliver, lateral segment (image 28/13) also with intrinsic T1 hyperintensity measuring 17 mm. Perfusional changes in the area related to portal venous thrombus, less conspicuous on delayed phase. Hepatic cysts elsewhere in the liver. Mild pericholecystic fluid and edema in the gallbladder wall with mild enhancement of the gallbladder wall. Mild intrahepatic biliary duct distension. Subtle area of T2 hypointensity along the  distal common bile duct up to 2-3 mm is nonspecific on thick section non MRCP sequences. Note that the study was not performed as an MRCP. Signs of peribiliary  enhancement in the setting of acute pancreatitis, see below. Pancreas: Extensive peripancreatic and interstitial pancreatic edema compatible with pancreatitis. Diffuse edema and developing fluid without focal characteristics tracking throughout the anterior pararenal space and about the peripancreatic fat and lesser sac. The amount of fluid and edema is increased over the short interval since the CT which was performed 8 hours prior to this exam. Edema blunts the intrinsic T1 signal that is seen in the pancreas. An area of non enhancement is present in the tail of the pancreas. This measures 2.8 x 2.4 cm (image 47/17) an additional area of pancreatic necrosis present in the neck of the pancreas (image 52/17 measuring 2.4 cm in the axial plane. There is no sign of ductal dilation. Spleen: Splenic vein is patent. Spleen is normal size without focal lesion. Edema tracks towards the spleen in the LEFT upper quadrant, increased since previous imaging as discussed. Adrenals/Urinary Tract:  Adrenal glands are normal. Bosniak category I cyst in the lower pole the LEFT kidney measuring 4.7 cm for which no follow-up is recommended in the absence of localizing symptoms. No suspicious renal lesion or hydronephrosis. 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 Main portal vein is patent as are LEFT portal branches. SMV is patent and splenic vein is patent. Normal caliber of the abdominal aorta. No adenopathy in the abdomen. Other: Small volume ascites. Anterior pararenal fluid and edema measuring up to 9.8 x 2.8 cm on the RIGHT is currently nonfocal and is increased in volume also along the LEFT hemiabdomen tracking towards the spleen and throughout the anterior pararenal space. Musculoskeletal: No suspicious bone lesions identified. IMPRESSION: 1. Multi cystic area  in the RIGHT hepatic lobe following a vascular distribution, affected by portal venous thrombus, in isolation is most suspicious for developing micro abscesses in the setting of poor vascular flow related to portal thrombus and acute necrotic pancreatitis. There is however intrinsic T1 hyperintensity which can be seen in the setting of hemorrhage, mucin or melanin (melanoma, felt unlikely based on distribution and associated findings at this time). Alternative neoplastic considerations remain in the differential due to a second smaller lesion in the LEFT hepatic lobe which is away from the affected vascular distribution, also with intrinsic T1 hyperintensity. For this reason sampling of the area in the RIGHT hepatic lobe may be helpful to exclude underlying malignancy and obtain a sample for microbiologic assessment. Ultimately, 8-12 week follow-up after therapy even if biopsy is negative is suggested to ensure resolution. 2. Acute necrotic and edematous pancreatitis with worsening since previous imaging. Focal areas of necrosis can lead to ductal interruption and for this reason close follow-up is suggested. 3. Mild intrahepatic biliary duct distension. Subtle area of T2 hypointensity along the distal common bile duct cannot be confirmed as intraductal stone and may simply represent volume averaging but does raise the question of intraductal calculus. Dedicated MRCP may be helpful in the setting of worsening pancreatitis. 4. Gallbladder edema with under distension favored to be secondary to severe acute pancreatitis, attention on follow-up. Could consider HIDA scan as warranted if there is continued concern for concomitant cholecystitis. 5. Increase in anterior pararenal fluid since previous imaging related to worsening of pancreatitis. These results will  be called to the ordering clinician or representative by the Radiologist Assistant, and communication documented in the PACS or [employer]. Electronically  Signed   By: Zetta Bills M.D.   On: 06/09/2022 09:10   US ABDOMEN LIMITED RUQ (LIVER/GB)  Result Date: 06/08/2022 CLINICAL DATA:  Right upper quadrant abdominal pain EXAM: ULTRASOUND ABDOMEN LIMITED RIGHT UPPER QUADRANT COMPARISON:  None Available. FINDINGS: Gallbladder: The gallbladder wall is mildly thickened and there is trace pericholecystic fluid, nonspecific. This can be seen in the setting of acute calculus cholecystitis or inflammatory conditions of the biliary tree, as can be seen with cholangitis or pancreatitis. The gallbladder is not distended. No intraluminal stones or sludge is identified. The sonographic Percell Miller sign is reportedly negative. Common bile duct: Diameter: 2 mm in proximal diameter Liver: There is intrahepatic biliary ductal dilation noted within the right hepatic lobe and within the biliary hilum. There is superimposed heterogeneity of the hepatic echotexture in this region, best appreciated on # [id number] and and ill-defined mixed solid and cystic mass is difficult to exclude. Multiple additional simple cysts are identified within the left hepatic lobe. Portal vein is patent on color Doppler imaging with normal direction of blood flow towards the liver. Other: No ascites IMPRESSION: 1. Mild gallbladder wall thickening with trace pericholecystic fluid, nonspecific. No evidence for cholelithiasis or gallbladder sludge. 2. Intrahepatic biliary ductal dilation within the right hepatic lobe and biliary hilum. There is superimposed heterogeneity of the hepatic echotexture in this region and ill-defined mixed solid and cystic mass is difficult to exclude. Further evaluation with contrast-enhanced MRI is recommended. 3. Multiple additional simple cysts within the left hepatic lobe. Electronically Signed   By: Fidela Salisbury M.D.   On: 06/08/2022 18:17   CT ABDOMEN PELVIS W CONTRAST  Result Date: 06/08/2022 CLINICAL DATA:  Hematuria.  Hypotension.  Syncopal episode. EXAM: CT ABDOMEN AND  PELVIS WITH CONTRAST TECHNIQUE: Multidetector CT imaging of the abdomen and pelvis was performed using the standard protocol following bolus administration of intravenous contrast. RADIATION DOSE REDUCTION: This exam was performed according to the departmental dose-optimization program which includes automated exposure control, adjustment of the mA and/or kV according to patient size and/or use of iterative reconstruction technique. CONTRAST:  33m OMNIPAQUE IOHEXOL 300 MG/ML  SOLN COMPARISON:  None Available. FINDINGS: Lower Chest: No acute findings. Hepatobiliary: Multiple small benign-appearing hepatic cysts are noted. An ill-defined heterogeneous low-attenuation lesion is seen in the anterior segment the right hepatic lobe which measures approximately 9.1 x 6.4 cm on image 34/2. 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 No other suspicious hepatic masses are identified. Mild diffuse gallbladder wall thickening is seen, which could be secondary to acute pancreatitis although acute cholecystitis cannot definitely be excluded. Pancreas: Moderate acute pancreatitis is seen with diffuse peripancreatic inflammatory changes and small amount of fluid extending inferiorly in the right anterior pararenal space. No evidence of pancreatic necrosis or pseudocysts. No evidence of pancreatic mass. Spleen: Within normal limits in size and appearance. Adrenals/Urinary Tract: 2 small less than 1 cm right renal calculi are seen. No evidence of ureteral calculi or hydronephrosis. 5 cm benign-appearing left renal cyst noted (no followup imaging recommended). No masses identified. Stomach/Bowel: Small hiatal hernia noted. No evidence of obstruction, inflammatory process or abnormal fluid collections. Normal appendix visualized. Diverticulosis is seen mainly involving the descending and sigmoid colon, however there is no evidence of diverticulitis.  Vascular/Lymphatic: No pathologically enlarged lymph nodes. No acute  vascular findings. Aortic atherosclerotic calcification incidentally noted. Reproductive: Brachytherapy seeds noted throughout the prostate gland. Otherwise unremarkable. Other:  None. Musculoskeletal:  No suspicious bone lesions identified. IMPRESSION: Moderate acute pancreatitis. No evidence of pancreatic necrosis or pseudocysts. Mild diffuse gallbladder wall thickening likely secondary to acute pancreatitis, although acute cholecystitis cannot definitely be excluded. 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 Differential diagnosis includes neoplasm and abscess. Consider abdomen MRI without and with contrast for further characterization. Right nephrolithiasis. No evidence of ureteral calculi or hydronephrosis. Small hiatal hernia. Colonic diverticulosis, without radiographic evidence of diverticulitis. Aortic Atherosclerosis (ICD10-I70.0). Electronically Signed   By: Marlaine Hind M.D.   On: 06/08/2022 15:55   DG Chest Portable 1 View  Result Date: 06/08/2022 CLINICAL DATA:  Weakness.  Hypotension. EXAM: PORTABLE CHEST 1 VIEW COMPARISON:  August 25, 2018. FINDINGS: EKG leads project over the chest. Cardiomediastinal contours and hilar structures are stable accounting for AP projection and portable technique. No lobar consolidation.  No gross effusion on frontal radiograph. Linear areas of perihilar airspace disease in the RIGHT chest. On limited assessment there is no acute skeletal process. IMPRESSION: Linear areas of perihilar airspace disease in the RIGHT chest, likely atelectasis. Electronically Signed   By: Zetta Bills M.D.   On: 06/08/2022 14:07      Assessment/Plan 1.  Acute kidney injury  I discussed PermCath placement with the patient's wife as well as 2 reasons for the PermCath.  Initially the patient's wife refused noting that  he had a femoral access and she did not wish to move forward with any other additional access.  The wife then spoke with Dr. Mortimer Fries and is now agreeable to proceed.  2.  Diabetes mellitus type 2  Currently without complication.  Patient had well-controlled A1c of 6.2 prior to admission.   Family Communication:  Thank you for allowing [place] to participate in the care of this patient.   Kris Hartmann, NP [facility] [phone] (Office Phone) [phone] (Office Fax)  06/25/2022 12:33 AM  Staff may message me via secure chat in [place]  but this may not receive immediate response,  please page for urgent matters!  Dictation software was used to generate the above note. Typos may occur and escape review, as with typed/written notes. Any error is purely unintentional.  Please contact me directly for clarity if needed.

## 2022-06-25 NOTE — Progress Notes (Signed)
Progress Note   Patient: Xavier Esparza LKG:[id number] DOB: 03-28-1946 DOA: 06/08/2022     17 DOS: the patient was seen and examined on 06/25/2022   Brief hospital course: 76 y.o. male with medical history significant for DM, BPH, history of prostate cancer, kidney stones status post lithotripsy, negative colonoscopy 2022 (3 benign polyps), who presents to the ED by EMS following a syncopal episode at home while walking to the bathroom.  Wife said she heard a loud thud and went to find him on the floor.  She said he was awake when she got there but he could not help himself up and she called 911.  Patient states he was in his usual state of health but about 2 weeks prior he saw a little blood in his urine similar to when he had his kidney stone.  He saw his urologist yesterday who gave him a prescription for Levaquin.  He otherwise denied feeling unwell previous to the episode.  He denied chest pain, shortness of breath, headache or visual disturbance, one-sided numbness weakness or tingling, nausea or vomiting, abdominal pain, urinary frequency or burning or diarrhea.[facility] Kitchen[facility]S reported BP 76/45 and he was treated with IM epinephrine and a 400 mL fluid bolus.  He was awake and alert x4 by arrival.  After his arrival in the emergency room he developed abdominal pain and vomiting.   Initial CT abdomen pelvis with moderate acute pancreatitis no evidence of pancreatic necrosis or pseudocyst.  Follow-up MRI abdomen concerning for possible choledocholithiasis with evidence of necrotizing pancreatitis.  Case discussed with GI Dr. Marius Ditch at [facility]   There was mention of possible portal vein thrombosis.  Heparin GTT was initially started however patient developed suspected acute hematuria so heparin GTT was stopped.  VQ scan ordered and perfusion component will be completed prior to patient transfer.  6/15: Clinical status overall unchanged.  Patient remains with abdominal pain associated with nausea.  Hemodynamically  stable.  Mild elevation in white count.  Lipase and LFTs have improved over interval.  Discussed case with GI and general surgery.  Likely that patient's presentation is secondary to necrotizing pancreatitis.  Currently as there is no CBD stone and there is no strong indication for transfer for ERCP.  From a surgical standpoint no intervention is recommended.  There is nothing from an interventional radiology standpoint to drain.  As such we will discontinue transfer and keep patient in [facility].   6/16: No appreciable change in clinical status.  LFTs are downtrending.  White count slightly downtrending.  No fevers noted over interval.  Increasing abdominal distention noted.  Ascites and peripancreatic fluid seen on repeat CAT scan today.  Discussed case with PCCM, nephrology, GI.  Will attempt postpyloric feedings.  Kidney function continues to deteriorate.  Nephrology will initiate hemodialysis today.   6/17: No change in clinical status.  White count remains minimally elevated.  No fevers over interval.  Remains with abdominal distention.  Started HD yesterday.  We will run another round of HD today.   6/18: No appreciable clinical changes.  White blood cell count slightly downtrending.  Creatinine worsened despite 2 rounds of hemodialysis.  Remains with hematuria despite stopping heparin 2 days ago.   6/19: Increasing abd distention.  KUB consistent with ileus.  [place] negative for ascites   6/20: Postpyloric Dobbhoff discontinued.  NGT placed.  Tip in stomach. Set to low intermittent suction.  Rectal tube in place.  Abdominal distention improved.  White blood cell count increased to 22  For the week of 6/21 through 06/22/2022:  the patient had an upper GI bleed and was transfused 3 units of packed red blood cells.  He still remains anemic.  EGD showing gastric ulcers.  Patient was initially on Protonix drip now on Protonix twice a day.  Last hemoglobin 7.3.  CRRT was started on 06/17/2022 and continuing  as per nephrology.  The patient is still anuric.  The patient severe necrotizing pancreatitis.  Repeat CT scan showed increased nodularity in the omentum.  No discrete abscess about the pancreas.  Did see possible liver abscess.  Antibiotics were changed over to meropenem and Diflucan.  Repeat blood cultures on 06/18/2022 are negative.  The patient did have an ileus on x-ray so tube feeds were stopped.  Patient is on TPN and has a PICC line.  The patient's mental status has waxed and waned during the week and CT scan of the head was negative.  6/28: Patient continued to experience abdominal pain.  Nephrology is planning to continue CRRT for now.  Continue to have NG tube but secretions are decreasing.  Remained on TPN.  6/29: Clinically seems stable.  Continue to require pain medications.  Nephrology is going to start him on intermittent dialysis and stopping CRRT.  Also trying trickle feeds through NG tube as NG tube secretions remained minimal. Hypophosphatemia which is being repleted. Anemia panel consistent with anemia of chronic disease with some iron deficiency. Ferritin levels elevated. We will start him on iron supplement once tube feed established.  6/30: Patient with very slow improvement.  Able to tolerate trickle food, rate was increased today. Started on intermittent dialysis.  Going for permanent HD catheter today.   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  With low-grade fever on 6/23 and I repeated blood cultures which are negative for 3 days.  Repeat CT scan does not show any abscess on the pancreas but does show fluid collection still in the liver which could be an abscess.  Empirically on meropenem and Diflucan.  Tumor markers ordered including CEA normal range at 0.8 and normal alpha-fetoprotein at less than 1.8.  CA 19-9 within normal range currently on TPN.   Patient is critically  ill.  Appreciate palliative care consultation. Patient having bowel movement now and started tolerating trickle feed -Slowly increased the rate of tube feeding -Continue with TPN  Acute blood loss anemia Last hemoglobin 7.6.  Patient received 3 units of packed red blood cells during the hospital course.  Had GI bleed on 06/16/2022.  Having some blood loss with the continuous dialysis if the filter gets clotted and unable to return blood. Anemia panel consistent with anemia of chronic disease with some iron deficiency. -Will start him on iron supplement once tube feed get established   Upper GI bleed Secondary to duodenal ulcers.  No active bleeding on EGD.  Monitor closely with heparin with dialysis.  On Protonix IV twice daily  Severe sepsis ([place]) Present on admission secondary to severe necrotizing pancreatitis.  Empirically on meropenem and Diflucan.  Repeat blood cultures negative.  Acute metabolic encephalopathy Mental status waxes and wanes..  Will give as needed low-dose Seroquel at night. Need to continue pain medications.  AKI (acute kidney injury) [facility]) Patient started on CRRT on 06/17/2022.  Critical care team took out the catheter of the neck and put a catheter in the right groin for better CRRT on 6/24.  Patient still anuric. -Patient was  started on intermittent dialysis today. -Going for permanent HD catheter placement  Portal vein thrombosis Unable to do anticoagulation with drop in hemoglobin and upper GI bleed.  Syncope Secondary to hypotension   Nephrolithiasis Nonobstructing right renal stone.  No acute issues suspected  History of prostate cancer No acute issues  BPH (benign prostatic hyperplasia) No acute issues  Diabetes mellitus without complication (HCC) Sugars elevated while on TPN.  On low-dose Semglee insulin at night.  Can probably get rid of Semglee insulin once TPN discontinued.     Subjective: Patient was seen and examined today.  Abdominal  pain much improved today.  He was getting his hemodialysis.  Abdominal tenderness also improved and tolerating trickle feed.  Physical Exam: Vitals:   06/25/22 1640 06/25/22 1645 06/25/22 1650 06/25/22 1655  BP: (!) 108/57 121/70 114/62 108/64  Pulse: 94 93 65 94  Resp: (!) 28 (!) 25  18  Temp:      TempSrc:      SpO2: 98% 98% 96% 94%  Weight:      Height:       General.  Ill-appearing gentleman, in no acute distress.  NG tube in place Pulmonary.  Lungs clear bilaterally, normal respiratory effort. CV.  Regular rate and rhythm, no JVD, rub or murmur. Abdomen.  Soft, nontender, nondistended, BS positive. CNS.  Alert and oriented .  No focal neurologic deficit. Extremities.  No edema, no cyanosis, pulses intact and symmetrical. Psychiatry.  Judgment and insight appears normal.  Data Reviewed: Prior data reviewed  Family Communication:   Disposition: Status is: Inpatient Remains inpatient appropriate because: Severity of illness   Planned Discharge Destination: To be determined  Time spent: 50 minutes  This record has been created using Systems analyst. Errors have been sought and corrected,but may not always be located. Such creation errors do not reflect on the standard of care.  Author: Lorella Nimrod, MD 06/25/2022 5:17 PM  For on call review [email or web address].

## 2022-06-25 NOTE — Assessment & Plan Note (Signed)
Patient started on CRRT on 06/17/2022.  Critical care team took out the catheter of the neck and put a catheter in the right groin for better CRRT on 6/24.  Patient still anuric. -Patient was started on intermittent dialysis today. -Going for permanent HD catheter placement

## 2022-06-25 NOTE — Progress Notes (Signed)
PHARMACY - TOTAL PARENTERAL NUTRITION CONSULT NOTE   Indication: without adequate nutrition for > 7 days / ileus  Patient Measurements: Height: 6' (182.9 cm) Weight: 98.2 kg (216 lb 7.9 oz) IBW/kg (Calculated) : 77.6 TPN AdjBW (KG): 83.7 Body mass index is 29.36 kg/m.  Assessment: 76 y/o male with h/o DM, BPH, prostate cancer, hiatal hernia, kidney stones and HTN who is admitted with severe necrotizing pancreatitis, portal vein thrombosis, liver lesions, AKI and sepsis.  Glucose / Insulin: BG 149 - 202 (21 units SSI required) Electrolytes: Hypophosphatemia secondary to CRRT Renal: CRRT stopped 6/28 PM and transitioned to iHD (first HD 6/29) Hepatic: Transaminitis resolved. LFTs within normal limits Intake / Output; MIVF: + 2L for the admission GI Imaging: 06/21/22 Abd Xray shows diffuse gaseous distention of bowel compatible with ileus GI Surgeries / Procedures: none recent  Central access: 06/17/22  TPN start date: 06/17/22   Nutritional Goals: Goal TPN rate is 70 mL/hr (provides 121 g of protein and 2241 kcals per day)  RD Assessment: Estimated Needs Total Energy Estimated Needs: 2200-2500kcal/day Total Protein Estimated Needs: 120-135g/day Total Fluid Estimated Needs: 2.1-2.4Lday  Current Nutrition:  Tube feeding at 10 mL/hr started 6/29 Plan is to continue to increase as tolerated to goal 65 mL/hr  Plan:  Modify TPN to 70 mL/hr --Changes made to decrease total volume provided via TPN given anuric requiring HD to avoid fluid overload Nutritional components: Amino acids (using 15% Clinisol): 121 g Dextrose: 319 g Lipids (using 20% SMOFlipids): 67 g kCal 2241 Volume: 1680 mL (1780 mL with overfill) Electrolytes in TPN: Na 38mq/L, K 230m/L, Ca 93m51mL, Mg 1mE10m, and Phos 1 mmol/L. Cl:Ac 1:1 Add standard MVI and trace elements to TPN Increase to moderate q4h SSI and adjust as needed  Continue 20u insulin in TPN per diabetes coordinator recommendations Monitor TPN labs  on Mon/Thurs, daily until stable  AlexBenita Gutter0/2023,8:27 AM

## 2022-06-25 NOTE — Progress Notes (Signed)
[facility] Vascular Consult Note  MRN : [id number]  Xavier Esparza is a 76 y.o. (1946/08/26) male who presents with chief complaint of  Chief Complaint  Patient presents with   Loss of Consciousness  .   Consulting Physician: Donell Beers, NP Reason for consult: PermCath placement History of Present Illness: Xavier Esparza is a 76 year old male who presented to [facility] with severe pancreatitis.  Since his initial presentation the patient has developed acute kidney injury requiring CRRT.  We are asked to assist with placement of a PermCath.  The patient today is somewhat confused.  He is able to answer simple yes or no questions.  Current Facility-Administered Medications  Medication Dose Route Frequency Provider Last Rate Last Admin   0.9 %  sodium chloride infusion   Intravenous PRN Rust-Chester, Huel Cote, NP   Stopped at 06/19/22 2153   acetaminophen (TYLENOL) tablet 650 mg  650 mg Oral Q6H PRN Athena Masse, MD   650 mg at 06/20/22 2355   Or   acetaminophen (TYLENOL) suppository 650 mg  650 mg Rectal Q6H PRN Athena Masse, MD       albumin human 25 % solution 25 g  25 g Intravenous BID Colon Flattery, NP 60 mL/hr at 06/24/22 2141 25 g at 06/24/22 2141   alteplase (CATHFLO ACTIVASE) injection 2 mg  2 mg Intracatheter Once PRN Colon Flattery, NP       Chlorhexidine Gluconate Cloth 2 % PADS 6 each  6 each Topical Q0600 Ralene Muskrat B, MD   6 each at 06/24/22 0852   feeding supplement (VITAL 1.5 CAL) liquid 1,000 mL  1,000 mL Per Tube Q24H Lorella Nimrod, MD   Infusion Verify at 06/24/22 2100   fluconazole (DIFLUCAN) IVPB 400 mg  400 mg Intravenous Q24H Benita Gutter, [place] at 06/24/22 2013   free water 20 mL  20 mL Per Tube Q4H Lorella Nimrod, MD   20 mL at 06/25/22 0005   guaiFENesin-dextromethorphan (ROBITUSSIN DM) 100-10 MG/5ML syrup 15 mL  15 mL Per Tube Q4H PRN Ralene Muskrat B, MD       haloperidol lactate (HALDOL) injection 1 mg  1 mg  Intravenous Q6H PRN Loletha Grayer, MD   1 mg at 06/24/22 0746   heparin injection 1,000 Units  1,000 Units Dialysis PRN Colon Flattery, NP   3,600 Units at 06/24/22 1431   heparin injection 5,000 Units  5,000 Units Subcutaneous Q8H Flora Lipps, MD   5,000 Units at 06/24/22 2141   HYDROmorphone (DILAUDID) injection 1 mg  1 mg Intravenous Q1H PRN Flora Lipps, MD   1 mg at 06/24/22 1225   hydrOXYzine (ATARAX) tablet 10 mg  10 mg Per Tube TID PRN Ralene Muskrat B, MD   10 mg at 06/21/22 2108   insulin aspart (novoLOG) injection 0-9 Units  0-9 Units Subcutaneous Q4H Benita Gutter, RPH   2 Units at 06/25/22 0004   ipratropium-albuterol (DUONEB) 0.5-2.5 (3) MG/3ML nebulizer solution 3 mL  3 mL Nebulization Q4H PRN Sreenath, Sudheer B, MD       lidocaine (PF) (XYLOCAINE) 1 % injection 5 mL  5 mL Intradermal PRN Colon Flattery, NP       lidocaine-prilocaine (EMLA) cream 1 Application  1 Application Topical PRN Colon Flattery, NP       lip balm (BLISTEX) ointment   Topical PRN Loletha Grayer, MD   1 Application at 93/26/71 1730   meropenem (MERREM) 500 mg in sodium  chloride 0.9 % 100 mL IVPB  500 mg Intravenous q1800 Benita Gutter, [facility]   Stopped at 06/24/22 1751   multivitamin (RENA-VIT) tablet 1 tablet  1 tablet Per Tube QHS Ralene Muskrat B, MD   1 tablet at 06/24/22 2141   Oral care mouth rinse  15 mL Mouth Rinse PRN Ralene Muskrat B, MD   15 mL at 06/19/22 1730   Oral care mouth rinse  15 mL Mouth Rinse 4 times per day Loletha Grayer, MD   15 mL at 06/24/22 2146   Oral care mouth rinse  15 mL Mouth Rinse PRN Loletha Grayer, MD       oxyCODONE (Oxy IR/ROXICODONE) immediate release tablet 5 mg  5 mg Per Tube Q4H PRN Renda Rolls, RPH   5 mg at 06/21/22 0529   pantoprazole (PROTONIX) injection 40 mg  40 mg Intravenous Q12H Loletha Grayer, MD   40 mg at 06/24/22 2141   pentafluoroprop-tetrafluoroeth (GEBAUERS) aerosol 1 Application  1 Application Topical PRN Colon Flattery, NP       QUEtiapine (SEROQUEL) tablet 12.5 mg  12.5 mg Oral QHS PRN Loletha Grayer, MD   12.5 mg at 06/22/22 2107   sodium chloride flush (NS) 0.9 % injection 10-40 mL  10-40 mL Intracatheter Q12H Loletha Grayer, MD   10 mL at 06/24/22 2146   sodium chloride flush (NS) 0.9 % injection 10-40 mL  10-40 mL Intracatheter PRN Loletha Grayer, MD       TPN ADULT (ION)   Intravenous Continuous TPN Benita Gutter, RPH 90 mL/hr at 06/24/22 2100 Infusion Verify at 06/24/22 2100    Past Medical History:  Diagnosis Date   Adopted    Cataract    resolved with surgery   Diabetes mellitus    Dyslipidemia    Erectile dysfunction    Herpes labialis    Upper Lip   Hyperlipidemia    Male hypogonadism    Prostate cancer [facility])    Renal stones    Thyroid nodule    Right    Past Surgical History:  Procedure Laterality Date   CATARACT EXTRACTION, BILATERAL  2017   Dr. Katy Fitch   COLONOSCOPY WITH PROPOFOL N/A 03/09/2018   Procedure: COLONOSCOPY WITH PROPOFOL;  Surgeon: Lin Landsman, MD;  Location: [place];  Service: Gastroenterology;  Laterality: N/A;   COLONOSCOPY WITH PROPOFOL N/A 03/12/2021   Procedure: COLONOSCOPY WITH PROPOFOL;  Surgeon: Lin Landsman, MD;  Location: [place];  Service: Endoscopy;  Laterality: N/A;  priority 4   CYSTOSCOPY W/ URETERAL STENT PLACEMENT  08/09/2017   Procedure: CYSTOSCOPY WITH RETROGRADE PYELOGRAM/URETERAL STENT PLACEMENT;  Surgeon: Royston Cowper, MD;  Location: [facility];  Service: Urology;;   ESOPHAGOGASTRODUODENOSCOPY (EGD) WITH PROPOFOL N/A 06/18/2022   Procedure: ESOPHAGOGASTRODUODENOSCOPY (EGD) WITH PROPOFOL;  Surgeon: Jonathon Bellows, MD;  Location: [facility];  Service: Gastroenterology;  Laterality: N/A;   EXTRACORPOREAL SHOCK WAVE LITHOTRIPSY Left 09/15/2017   Procedure: EXTRACORPOREAL SHOCK WAVE LITHOTRIPSY (ESWL);  Surgeon: Royston Cowper, MD;  Location: [facility];  Service: Urology;  Laterality: Left;   EYE  SURGERY Bilateral 2016   cataract extractions   GREEN LIGHT LASER TURP (TRANSURETHRAL RESECTION OF PROSTATE N/A 04/20/2016   Procedure: GREEN LIGHT LASER TURP (TRANSURETHRAL RESECTION OF PROSTATE;  Surgeon: Royston Cowper, MD;  Location: [facility];  Service: Urology;  Laterality: N/A;   INGUINAL HERNIA REPAIR  01/11/2012   Procedure: HERNIA REPAIR INGUINAL ADULT;  Surgeon: Pedro Earls, MD;  Location: [facility]  [place];  Service: General;  Laterality: Right;  Open right inguinal hernia repair with mesh   POLYPECTOMY  03/12/2021   Procedure: POLYPECTOMY;  Surgeon: Lin Landsman, MD;  Location: [place];  Service: Endoscopy;;   RADIOACTIVE SEED IMPLANT     PROSTATE   URETEROSCOPY WITH HOLMIUM LASER LITHOTRIPSY Right 08/09/2017   Procedure: URETEROSCOPY WITH HOLMIUM LASER LITHOTRIPSY;  Surgeon: Royston Cowper, MD;  Location: [facility];  Service: Urology;  Laterality: Right;    Social History Social History   Tobacco Use   Smoking status: Former    Years: 30.00    Types: Cigarettes    Quit date: 12/27/1990    Years since quitting: 31.5   Smokeless tobacco: Never  Vaping Use   Vaping Use: Never used  Substance Use Topics   Alcohol use: Yes    Comment: Rare Socially   Drug use: No    Family History Family History  Adopted: Yes    No Known Allergies   REVIEW OF SYSTEMS (Negative unless checked)  Constitutional: '[]'$ Weight loss  '[]'$ Fever  '[]'$ Chills Cardiac: '[]'$ Chest pain   '[]'$ Chest pressure   '[]'$ Palpitations   '[]'$ Shortness of breath when laying flat   '[]'$ Shortness of breath at rest   '[]'$ Shortness of breath with exertion. Vascular:  '[]'$ Pain in legs with walking   '[]'$ Pain in legs at rest   '[]'$ Pain in legs when laying flat   '[]'$ Claudication   '[]'$ Pain in feet when walking  '[]'$ Pain in feet at rest  '[]'$ Pain in feet when laying flat   '[]'$ History of DVT   '[]'$ Phlebitis   '[]'$ Swelling in legs   '[]'$ Varicose veins   '[]'$ Non-healing ulcers Pulmonary:   '[]'$ Uses home oxygen   '[]'$ Productive cough    '[]'$ Hemoptysis   '[]'$ Wheeze  '[]'$ COPD   '[]'$ Asthma Neurologic:  '[]'$ Dizziness  '[]'$ Blackouts   '[]'$ Seizures   '[]'$ History of stroke   '[]'$ History of TIA  '[]'$ Aphasia   '[]'$ Temporary blindness   '[]'$ Dysphagia   '[]'$ Weakness or numbness in arms   '[]'$ Weakness or numbness in legs Musculoskeletal:  '[]'$ Arthritis   '[]'$ Joint swelling   '[]'$ Joint pain   '[]'$ Low back pain Hematologic:  '[]'$ Easy bruising  '[]'$ Easy bleeding   '[]'$ Hypercoagulable state   '[]'$ Anemic  '[]'$ Hepatitis Gastrointestinal:  '[]'$ Blood in stool   '[]'$ Vomiting blood  '[]'$ Gastroesophageal reflux/heartburn   '[]'$ Difficulty swallowing. Genitourinary:  '[]'$ Chronic kidney disease   '[]'$ Difficult urination  '[]'$ Frequent urination  '[]'$ Burning with urination   '[]'$ Blood in urine Skin:  '[]'$ Rashes   '[]'$ Ulcers   '[]'$ Wounds Psychological:  '[]'$ History of anxiety   '[]'$  History of major depression.  Physical Examination  Vitals:   06/24/22 1830 06/24/22 1845 06/24/22 1900 06/24/22 1915  BP: 109/90 114/77 (!) 129/94 (!) 123/93  Pulse: 97   94  Resp: (!) 28 (!) 23 (!) 29 (!) 23  Temp:    99.6 F (37.6 C)  TempSrc:    Oral  SpO2: 100% 99% 99% 99%  Weight:      Height:       Body mass index is 28.97 kg/m. Gen:  WD/WN, NAD confused Head: [place]/AT, No temporalis wasting. Prominent temp pulse not noted. Ear/Nose/Throat: Hearing grossly intact, nares w/o erythema or drainage, oropharynx w/o Erythema/Exudate Eyes: Sclera non-icteric, conjunctiva clear Neck: Trachea midline.  No JVD.  Pulmonary:  Good air movement, respirations not labored, equal bilaterally.  Cardiac: RRR, normal S1, S2. Vascular: Minimal peripheral edema  Gastrointestinal: soft, non-tender/non-distended. No guarding/reflex.  Musculoskeletal: M/S 5/5 throughout.  Extremities without ischemic changes.  No deformity or atrophy. No  edema. Neurologic: Sensation grossly intact in extremities.  Symmetrical.  Speech is fluent. Motor exam as listed above. Psychiatric: Judgment intact, Mood & affect appropriate for pt's clinical  situation. Dermatologic: No rashes or ulcers noted.  No cellulitis or open wounds. Lymph : No Cervical, Axillary, or Inguinal lymphadenopathy.    CBC Lab Results  Component Value Date   WBC 25.8 (H) 06/24/2022   HGB 7.6 (L) 06/24/2022   HCT 23.4 (L) 06/24/2022   MCV 85.4 06/24/2022   PLT 175 06/24/2022    BMET    Component Value Date/Time   NA 136 06/24/2022 1949   K 3.8 06/24/2022 1949   CL 99 06/24/2022 1949   CO2 25 06/24/2022 1949   GLUCOSE 160 (H) 06/24/2022 1949   BUN 48 (H) 06/24/2022 1949   CREATININE 3.20 (H) 06/24/2022 1949   CALCIUM 8.1 (L) 06/24/2022 1949   GFRNONAA 19 (L) 06/24/2022 1949   GFRAA >60 04/16/2016 0946   Estimated Creatinine Clearance: 23.3 mL/min (A) (by C-G formula based on SCr of 3.2 mg/dL (H)).  COAG Lab Results  Component Value Date   INR 1.5 (H) 06/12/2022   INR 1.1 06/08/2022    Radiology US Abdomen Limited RUQ (LIVER/GB)  Result Date: 06/24/2022 CLINICAL DATA:  Liver abscess EXAM: ULTRASOUND ABDOMEN LIMITED RIGHT UPPER QUADRANT COMPARISON:  CT 06/18/2022, 06/11/2022, MRI 06/08/2022 FINDINGS: Gallbladder: No gallstones or wall thickening visualized. No sonographic Murphy sign noted by sonographer. Common bile duct: Diameter: 6.1 mm Liver: Heterogenous echotexture. Cyst in the right posterior hepatic lobe measuring 3.2 x 5.2 x 2.9 cm. Multiple additional cysts within the liver, including septated cyst in the left hepatic lobe measuring 3.7 x 4.3 x 4 cm. No discrete abscess collection to correspond to the questioned area on CT and MRI. Portal vein is patent on color Doppler imaging with normal direction of blood flow towards the liver. Other: None. IMPRESSION: 1. No definite drainable abscess collection by ultrasound to correspond to the areas seen on CT and MRI. 2. There are multiple liver cysts both simple and complex, see MRI 06/08/2022. Electronically Signed   By: Donavan Foil M.D.   On: 06/24/2022 16:47   DG Chest Port 1 View  Result  Date: 06/24/2022 CLINICAL DATA:  Abdominal distension and hypoxia. EXAM: PORTABLE CHEST 1 VIEW COMPARISON:  Chest radiograph 06/14/2022 and abdominal CT 06/18/2022 FINDINGS: Nasogastric tube extends into the abdomen. Right arm PICC line tip terminates in the SVC region. Again noted are slightly low lung volumes. Improved aeration at the right lung base compared to the previous chest radiographic examination. Few densities along the medial right lung base are suggestive for atelectasis. Otherwise, no significant airspace disease or consolidation. Heart size is normal. IMPRESSION: Residual densities at the medial right lung base probably represent atelectasis. Otherwise, no acute chest findings. Electronically Signed   By: Markus Daft M.D.   On: 06/24/2022 09:12   DG Abd 1 View  Result Date: 06/24/2022 CLINICAL DATA:  Abdominal distension and hypoxia. EXAM: ABDOMEN - 1 VIEW COMPARISON:  06/21/2022 FINDINGS: There is a right femoral dialysis catheter with the tip in the lower IVC region. Brachytherapy seeds in the prostate. Bowel gas throughout the abdomen and pelvis. There is evidence for gas in the stomach, small bowel and colon. There is a nasogastric tube which terminates in the stomach body region. Limited evaluation for free air on the supine images. IMPRESSION: 1. Mild gaseous distension throughout the abdomen and pelvis. Findings are nonspecific but could represent an ileus pattern.  2. Right femoral dialysis catheter with the tip in the lower IVC region. Electronically Signed   By: Markus Daft M.D.   On: 06/24/2022 09:06   DG Abd 1 View  Result Date: 06/21/2022 CLINICAL DATA:  Concern for ileus EXAM: ABDOMEN - 1 VIEW COMPARISON:  CT 06/18/2022 FINDINGS: Diffuse gaseous distension of bowel in a nonobstructive pattern. Nasogastric tube tip and side port overlie the stomach. IMPRESSION: Diffuse gaseous distention of bowel compatible with ileus. Electronically Signed   By: Maurine Simmering M.D.   On: 06/21/2022  10:35   CT ABDOMEN PELVIS WO CONTRAST  Result Date: 06/18/2022 CLINICAL DATA:  Acute severe pancreatitis EXAM: CT ABDOMEN AND PELVIS WITHOUT CONTRAST TECHNIQUE: Multidetector CT imaging of the abdomen and pelvis was performed following the standard protocol without IV contrast. RADIATION DOSE REDUCTION: This exam was performed according to the departmental dose-optimization program which includes automated exposure control, adjustment of the mA and/or kV according to patient size and/or use of iterative reconstruction technique. COMPARISON:  None Available. FINDINGS: Lower chest: Bibasilar effusions and passive atelectasis not changed. No pericardial fluid. Hepatobiliary: Segmental hypodensities in the medial RIGHT hepatic lobe remain concerning for hepatic abscess. Additional low-density hepatic cysts again noted. No change. Gallbladder unchanged. Pancreas: There is interval increase in edema inflammation of the pancreas poor definition of the border. There is a lesion of low attenuation within the mid body pancreas measuring 4 cm x 2.4 cm (35/2 which is poorly defined but appears new from prior. No clear organized fluid collections. There is interval increase in the omental mesenteric thickening associated with acute pancreatitis. Suggestion of multiple small nodules within the omentum and retroperitoneum associated with the fluid collections. For example in Morrison's pouch multiple small nodules. Representative hazy thickening along the anterior LEFT and RIGHT pararenal fascia. Injection of the retroperitoneal fat within the pararenal space and deep the pararenal space on the RIGHT for example (image 50/2). Increased thickening along the LEFT pararenal space (image 39/2). No organized fluid collections. Spleen: Normal spleen Adrenals/urinary tract: Adrenal glands and kidneys normal. Nonobstructing calculus are RIGHT kidney. Foley catheter in bladder. Stomach/Bowel: NG tube in stomach. Duodenum small bowel  grossly normal. Appendix normal. Multiple diverticula of the descending colon and sigmoid colon without acute inflammation. Fluid stool in the rectum. Rectal tube in place Vascular/Lymphatic: Abdominal aorta is normal caliber. No periportal or retroperitoneal adenopathy. No pelvic adenopathy. Reproductive: Unremarkable Other: No free fluid. Musculoskeletal: No aggressive osseous lesion. IMPRESSION: 1. Increased nodularity associated with the omental inflammation and inflammatory thickening along the retroperitoneal perirenal space. This nodularity suggest progression of pancreatic inflammation versus potentially pancreatic adenocarcinoma metastasis. Recommend correlation with appropriate tumor markers. 2. Pancreas is less well-defined. There is a low-density region within the mid body the pancreas. No clear organized fluid collections are present. 3. Cluster of low-attenuation lesion in the RIGHT hepatic lobe could represent hepatic abscess. Cannot exclude malignancy. Electronically Signed   By: Suzy Bouchard M.D.   On: 06/18/2022 13:56   CT HEAD WO CONTRAST (5MM)  Result Date: 06/18/2022 CLINICAL DATA:  Provided history: Delirium. EXAM: CT HEAD WITHOUT CONTRAST TECHNIQUE: Contiguous axial images were obtained from the base of the skull through the vertex without intravenous contrast. RADIATION DOSE REDUCTION: This exam was performed according to the departmental dose-optimization program which includes automated exposure control, adjustment of the mA and/or kV according to patient size and/or use of iterative reconstruction technique. COMPARISON:  No pertinent prior exams available for comparison. FINDINGS: Brain: Mild cerebral atrophy. There is no  acute intracranial hemorrhage. No demarcated cortical infarct. No extra-axial fluid collection. No evidence of an intracranial mass. No midline shift. Vascular: No hyperdense vessel. Atherosclerotic calcifications. Skull: No fracture or aggressive osseous lesion.  Sinuses/Orbits: No mass or acute finding within the imaged orbits. No significant paranasal sinus disease at the imaged levels. IMPRESSION: No evidence of acute intracranial abnormality. Mild cerebral atrophy. Electronically Signed   By: Kellie Simmering D.O.   On: 06/18/2022 13:37   [place] EKG SITE RITE  Result Date: 06/17/2022 If [facility] image not attached, placement could not be confirmed due to current cardiac rhythm.  DG Abd 1 View  Result Date: 06/14/2022 CLINICAL DATA:  [id number].  NG tube placement EXAM: ABDOMEN - 1 VIEW COMPARISON:  X-ray abdomen 06/14/2022 2:37 p.m. FINDINGS: Interval removal of an enteric tube. Interval placement of a new enteric tube with tip and side port overlying the gastric lumen. Persistent gaseous dilatation of the small and large bowel. Colonic diverticulosis. No radio-opaque calculi or other significant radiographic abnormality are seen. IMPRESSION: 1. Interval removal of an enteric tube. Interval placement of a new enteric tube with tip and side port overlying the gastric lumen. 2. Postop ileus. Electronically Signed   By: Iven Finn M.D.   On: 06/14/2022 16:56   DG Abd 1 View  Result Date: 06/14/2022 CLINICAL DATA:  Ileus EXAM: ABDOMEN - 1 VIEW COMPARISON:  None Available. FINDINGS: Feeding tube tip overlies the fourth portion of the duodenum. There is diffuse mild gaseous distension of bowel in a nonobstructive pattern. IMPRESSION: Mild diffuse gaseous distension foot of bowel, compatible with ileus. Electronically Signed   By: Maurine Simmering M.D.   On: 06/14/2022 15:22   [place] ASCITES (ABDOMEN LIMITED)  Result Date: 06/14/2022 CLINICAL DATA:  Evaluate for ascites EXAM: LIMITED ABDOMEN ULTRASOUND FOR ASCITES TECHNIQUE: Limited ultrasound survey for ascites was performed in all four abdominal quadrants. COMPARISON:  None Available. FINDINGS: There is no sonographic evidence of ascites in all 4 quadrants of abdomen. IMPRESSION: There is no demonstrable ascites.  Electronically Signed   By: Elmer Picker M.D.   On: 06/14/2022 08:57   DG Chest Port 1 View  Result Date: 06/14/2022 CLINICAL DATA:  Shortness of breath. EXAM: PORTABLE CHEST 1 VIEW COMPARISON:  June 11, 2022. FINDINGS: Stable cardiomediastinal silhouette. Feeding tube is seen entering stomach. Hypoinflation of the lungs is noted with mild bibasilar subsegmental atelectasis. Minimal left pleural effusion is noted. Bony thorax is unremarkable. IMPRESSION: Hypoinflation of the lungs is noted with mild bibasilar subsegmental atelectasis. Minimal left pleural effusion. Electronically Signed   By: Marijo Conception M.D.   On: 06/14/2022 08:11   DG Chest Port 1 View  Addendum Date: 06/11/2022   ADDENDUM REPORT: 06/11/2022 17:02 ADDENDUM: Impression #1 called to Dr. Milon Dikes of the ICU at 2:58 p.m. on 06/11/2022. Electronically Signed   By: Kellie Simmering D.O.   On: 06/11/2022 17:02   Result Date: 06/11/2022 CLINICAL DATA:  Provided history: Encounter for central line and NG tube placement. EXAM: PORTABLE CHEST 1 VIEW COMPARISON:  Prior chest radiographs 06/08/2022 and earlier. Same day CT abdomen/pelvis 06/11/2022. FINDINGS: Left IJ approach central venous catheter terminating in the expected location of the brachiocephalic/SVC confluence. An enteric tube is coiled at the level of the lower neck and terminates at the level of the distal esophagus. Shallow inspiration radiograph, accentuating the cardiac silhouette and limiting evaluation of heart size. Small bilateral pleural effusions better appreciated on same day CT abdomen/pelvis. Mild bibasilar atelectasis. No evidence of  pneumothorax. No acute bony abnormality identified. Degenerative changes of the left glenohumeral joint. Attempts are being made to reach the ordering provider at this time regarding impression #1. IMPRESSION: An enteric tube is coiled at the level of the lower neck and terminates at the level of the distal esophagus. Left IJ approach  central venous catheter terminating in the expected location of the brachiocephalic/SVC confluence. Shallow inspiration radiograph with small bilateral pleural effusions and bibasilar atelectasis. Electronically Signed: By: Kellie Simmering D.O. On: 06/11/2022 14:34   DG Naso G Tube Plc W/Fl W/Rad  Result Date: 06/11/2022 CLINICAL DATA:  History of prostate cancer, sepsis, abdominal pain, pancreatitis EXAM: NASO G TUBE PLACEMENT WITH FL AND WITH RAD CONTRAST:  5 mL Omnipaque 300 FLUOROSCOPY: Radiation Exposure Index (if provided by the fluoroscopic device): 41.4 mGy Number of Acquired Spot Images: 0 COMPARISON:  None Available. FINDINGS: Dobbhoff tube was inserted through the right nostril and advanced into the stomach. Dobbhoff tube was foot the further advanced into the fourth portion of the duodenum. Stylet was removed and tube was secured to the skin surface of the nose. No immediate complications.  Incidentally noted small hiatal hernia. IMPRESSION: Successful placement of a Dobbhoff tube with the tip located distal to the pylorus in the fourth portion of the duodenum. Electronically Signed   By: Kathreen Devoid M.D.   On: 06/11/2022 15:20   CT ABDOMEN PELVIS WO CONTRAST  Result Date: 06/11/2022 CLINICAL DATA:  Sepsis, abdominal pain. History of prostate cancer. History of renal calculi. Syncope. Pancreatitis with focal pancreatic necrosis on 06/08/2022 MRI. 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 EXAM: CT ABDOMEN AND PELVIS WITHOUT CONTRAST TECHNIQUE: Multidetector CT imaging of the abdomen and pelvis was performed following the standard protocol without IV contrast. RADIATION DOSE REDUCTION: This exam was performed according to the departmental dose-optimization program which includes automated exposure control, adjustment of the mA and/or kV according to patient size and/or use of iterative reconstruction technique. COMPARISON:  Multiple exams, including MRI  06/09/2022 and 06/08/2022 FINDINGS: Lower chest: Small but increased bilateral pleural effusions with passive atelectasis. Coronary artery atherosclerotic calcifications. Hepatobiliary: Heterogeneous density in the right hepatic lobe corresponding to the prior region of portal vein thrombosis and suspected micro abscesses. Scattered hepatic fluid density lesions compatible with cysts throughout the rest of the liver. High density material in the gallbladder, possibly sludge. Probable sludge in the nondilated common bile duct. Pancreas: Substantial peripancreatic edema. No gas density is identified within the pancreatic parenchyma or previous regions of pancreatic necrosis to indicate super infection with gas-forming organism. Acute peripancreatic fluid collections appear increased and there is increased surrounding mesenteric edema and fluid tracking along the paracolic gutters and anterior margins the perirenal fascia. Spleen: Unremarkable Adrenals/Urinary Tract: Two nonobstructive right renal calculi are observed, the larger measuring 0.7 cm in the right kidney lower pole on image 68 series 5. Benign 4.4 cm left kidney lower pole cyst. There is substantial contrast medium in the renal parenchyma bilaterally on today's noncontrast examination, indicating persistent nephrograms compatible with acute renal failure. This is likely left over from prior contrast enhanced studies from 06/08/2022 and possibly 06/09/2022. Correlate with urine production. Stomach/Bowel: Small type 1 hiatal hernia. Indistinct descending and transverse duodenum likely from secondary inflammation. Scattered colonic diverticula. Descending and sigmoid colon diverticulosis. Vascular/Lymphatic: Atherosclerosis is present, including aortoiliac atherosclerotic disease. Reproductive: Brachytherapy seed implants in the prostate gland. Other: Increased ascites likely related to the pancreatitis. There is also increasing presacral  edema.  Musculoskeletal: Lumbar spondylosis and degenerative disc disease causing multilevel impingement. IMPRESSION: 1. Increased ascites and acute peripancreatic fluid collections. No gas is identified within the pancreatic parenchyma to suggest superinfection of the prior pancreatic necrosis with gas-forming organism at this time. Today's exam is performed without IV contrast and accordingly assessment for further pancreatic necrosis is not feasible. 2. Delayed nephrogram with contrast in the kidneys left over from the studies from 2-3 days ago. Appearance compatible with acute renal failure. 3. Small but increased bilateral pleural effusions with passive atelectasis. 4. 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 5. Two nonobstructive right renal calculi are observed. 6. Other imaging findings of potential clinical significance: Aortic Atherosclerosis (ICD10-I70.0). Brachytherapy seed implants in the prostate gland. Multilevel lumbar impingement. Small type 1 hiatal hernia. Descending and sigmoid colon diverticulosis. Secondary inflammation of the descending and transverse duodenum. Electronically Signed   By: Van Clines M.D.   On: 06/11/2022 09:34   US RENAL  Result Date: 06/10/2022 CLINICAL DATA:  Acute kidney injury EXAM: RENAL / URINARY TRACT ULTRASOUND COMPLETE COMPARISON:  No prior ultrasound, correlation is made with CT abdomen pelvis and MRI abdomen 06/08/2022 FINDINGS: Right Kidney: Renal measurements: 11.6 x 5.6 x 5.2 cm = volume: 172 mL. Echogenicity within normal limits. No mass or hydronephrosis visualized. Left Kidney: Renal measurements: 12.9 x 5.8 x 5.1 cm = volume: 201 mL. Echogenicity within normal limits. No hydronephrosis visualized. Cystic lesion in the mid left kidney measures up to 4.4 x 4.7 x 5.1 cm, consistent with a simple cyst, as seen on the 06/08/2022 CT  and MRI Bladder: Appears normal for degree of bladder distention. Other: Small amount of ascites. IMPRESSION: 1. No acute finding in the kidneys. 2. Small volume ascites. Electronically Signed   By: Merilyn Baba M.D.   On: 06/10/2022 12:21   ECHOCARDIOGRAM COMPLETE  Result Date: 06/09/2022    ECHOCARDIOGRAM REPORT   Patient Name:   DAMASO LADAY Date of Exam: 06/09/2022 Medical Rec #:  [id number]    Height:       72.0 in Accession #:    [id number]   Weight:       213.0 lb Date of Birth:  04-10-1946    BSA:          2.188 m Patient Age:    57 years     BP:           139/86 mmHg Patient Gender: M            HR:           74 bpm. Exam Location:  [facility] Procedure: 2D Echo, Color Doppler and Cardiac Doppler Indications:     R55 Syncope  History:         Patient has no prior history of Echocardiogram examinations.                  Risk Factors:Diabetes and Dyslipidemia.  Sonographer:     Charmayne Sheer Referring Phys:  [id number] Athena Masse Diagnosing Phys: Kate Sable MD  Sonographer Comments: Suboptimal apical window and suboptimal subcostal window. IMPRESSIONS  1. Left ventricular ejection fraction, by estimation, is 60 to 65%. The left ventricle has normal function. The left ventricle has no regional wall motion abnormalities. There is mild left ventricular hypertrophy. Left ventricular diastolic parameters are consistent with Grade I diastolic dysfunction (impaired relaxation).  2. Right ventricular systolic  function is normal. The right ventricular size is normal.  3. The mitral valve is normal in structure. Mild mitral valve regurgitation.  4. The aortic valve is tricuspid. Aortic valve regurgitation is not visualized. Aortic valve sclerosis is present, with no evidence of aortic valve stenosis.  5. Aortic dilatation noted. There is borderline dilatation of the aortic root, measuring 38 mm.  6. The inferior vena cava is normal in size with greater than 50% respiratory variability, suggesting right atrial  pressure of 3 mmHg. FINDINGS  Left Ventricle: Left ventricular ejection fraction, by estimation, is 60 to 65%. The left ventricle has normal function. The left ventricle has no regional wall motion abnormalities. The left ventricular internal cavity size was normal in size. There is  mild left ventricular hypertrophy. Left ventricular diastolic parameters are consistent with Grade I diastolic dysfunction (impaired relaxation). Right Ventricle: The right ventricular size is normal. No increase in right ventricular wall thickness. Right ventricular systolic function is normal. Left Atrium: Left atrial size was normal in size. Right Atrium: Right atrial size was normal in size. Pericardium: There is no evidence of pericardial effusion. Mitral Valve: The mitral valve is normal in structure. Mild mitral valve regurgitation. MV peak gradient, 4.0 mmHg. The mean mitral valve gradient is 1.0 mmHg. Tricuspid Valve: The tricuspid valve is normal in structure. Tricuspid valve regurgitation is trivial. Aortic Valve: The aortic valve is tricuspid. Aortic valve regurgitation is not visualized. Aortic valve sclerosis is present, with no evidence of aortic valve stenosis. Aortic valve mean gradient measures 3.0 mmHg. Aortic valve peak gradient measures 7.2  mmHg. Aortic valve area, by VTI measures 2.69 cm. Pulmonic Valve: The pulmonic valve was not well visualized. Pulmonic valve regurgitation is not visualized. Aorta: Aortic dilatation noted. There is borderline dilatation of the aortic root, measuring 38 mm. Venous: The inferior vena cava is normal in size with greater than 50% respiratory variability, suggesting right atrial pressure of 3 mmHg. IAS/Shunts: No atrial level shunt detected by color flow Doppler.  LEFT VENTRICLE PLAX 2D LVIDd:         4.48 cm   Diastology LVIDs:         3.25 cm   LV e' medial:    6.42 cm/s LV PW:         1.07 cm   LV E/e' medial:  6.8 LV IVS:        1.01 cm   LV e' lateral:   7.83 cm/s LVOT diam:      2.20 cm   LV E/e' lateral: 5.6 LV SV:         53 LV SV Index:   24 LVOT Area:     3.80 cm  RIGHT VENTRICLE RV Basal diam:  2.30 cm LEFT ATRIUM             Index        RIGHT ATRIUM           Index LA diam:        3.90 cm 1.78 cm/m   RA Area:     11.40 cm LA Vol (A2C):   31.8 ml 14.53 ml/m  RA Volume:   21.60 ml  9.87 ml/m LA Vol (A4C):   30.7 ml 14.03 ml/m LA Biplane Vol: 32.9 ml 15.03 ml/m  AORTIC VALVE                    PULMONIC VALVE AV Area (Vmax):    2.74 cm  PV Vmax:       0.98 m/s AV Area (Vmean):   2.84 cm     PV Vmean:      70.200 cm/s AV Area (VTI):     2.69 cm     PV VTI:        0.136 m AV Vmax:           134.00 cm/s  PV Peak grad:  3.9 mmHg AV Vmean:          84.600 cm/s  PV Mean grad:  2.0 mmHg AV VTI:            0.198 m AV Peak Grad:      7.2 mmHg AV Mean Grad:      3.0 mmHg LVOT Vmax:         96.60 cm/s LVOT Vmean:        63.300 cm/s LVOT VTI:          0.140 m LVOT/AV VTI ratio: 0.71  AORTA Ao Root diam: 3.80 cm MITRAL VALVE MV Area (PHT): 2.25 cm    SHUNTS MV Area VTI:   2.66 cm    Systemic VTI:  0.14 m MV Peak grad:  4.0 mmHg    Systemic Diam: 2.20 cm MV Mean grad:  1.0 mmHg MV Vmax:       1.00 m/s MV Vmean:      52.6 cm/s MV Decel Time: 337 msec MV E velocity: 43.70 cm/s MV A velocity: 97.30 cm/s MV E/A ratio:  0.45 Kate Sable MD Electronically signed by Kate Sable MD Signature Date/Time: 06/09/2022/6:43:28 PM    Final    MR ABDOMEN MRCP WO CONTRAST  Result Date: 06/09/2022 CLINICAL DATA:  A 76 year old male presents for follow-up of imaging that was acquired earlier on the same date to assess the biliary tree in the setting of acute necrotic pancreatitis. Please see earlier MRI for further details regarding pancreatitis and associated findings. EXAM: MRI ABDOMEN WITHOUT CONTRAST  (INCLUDING MRCP) TECHNIQUE: Multiplanar multisequence MR imaging of the abdomen was performed. Heavily T2-weighted images of the biliary and pancreatic ducts were obtained, and  three-dimensional MRCP images were rendered by post processing. COMPARISON:  MRI of the same date for which this study was done to complete the evaluation of the biliary tree. FINDINGS: Lower chest: Effusions partially visualized in the lower chest. Not well assessed. Hepatobiliary: Hepatic findings with hepatic lesions and portal venous thrombosis as well as hepatic cysts better outlined on previous MRI evaluation. There is sludge in the gallbladder. There is gallbladder wall edema as outlined on previous imaging. There is no discrete evidence of choledocholithiasis. The biliary tree is nondilated. The pancreatic duct is nondilated. Pancreas: Pancreatic edema as seen on recent imaging better displayed on recent MR evaluation performed earlier the same day. Spleen:  Not well evaluated better evaluated on previous imaging. Adrenals/Urinary Tract: Similarly with limited assessment on this dedicated MRCP. Stomach/Bowel: Edema of the stomach and adjacent small bowel also better displayed on recent dedicated abdominal MRI. Vascular/Lymphatic:  Not well evaluated Other: Extensive edema throughout the upper abdomen with signs of perihepatic ascites. Musculoskeletal: Not well evaluated IMPRESSION: No signs of choledocholithiasis without dilation of the biliary tree. See dedicated abdominal MRI for further details regarding liver and pancreas as well as associated findings of acute necrotic pancreatitis. Electronically Signed   By: Zetta Bills M.D.   On: 06/09/2022 15:54   MR 3D Recon At Scanner  Result Date: 06/09/2022 CLINICAL DATA:  A 76 year old male presents  for follow-up of imaging that was acquired earlier on the same date to assess the biliary tree in the setting of acute necrotic pancreatitis. Please see earlier MRI for further details regarding pancreatitis and associated findings. EXAM: MRI ABDOMEN WITHOUT CONTRAST  (INCLUDING MRCP) TECHNIQUE: Multiplanar multisequence MR imaging of the abdomen was  performed. Heavily T2-weighted images of the biliary and pancreatic ducts were obtained, and three-dimensional MRCP images were rendered by post processing. COMPARISON:  MRI of the same date for which this study was done to complete the evaluation of the biliary tree. FINDINGS: Lower chest: Effusions partially visualized in the lower chest. Not well assessed. Hepatobiliary: Hepatic findings with hepatic lesions and portal venous thrombosis as well as hepatic cysts better outlined on previous MRI evaluation. There is sludge in the gallbladder. There is gallbladder wall edema as outlined on previous imaging. There is no discrete evidence of choledocholithiasis. The biliary tree is nondilated. The pancreatic duct is nondilated. Pancreas: Pancreatic edema as seen on recent imaging better displayed on recent MR evaluation performed earlier the same day. Spleen:  Not well evaluated better evaluated on previous imaging. Adrenals/Urinary Tract: Similarly with limited assessment on this dedicated MRCP. Stomach/Bowel: Edema of the stomach and adjacent small bowel also better displayed on recent dedicated abdominal MRI. Vascular/Lymphatic:  Not well evaluated Other: Extensive edema throughout the upper abdomen with signs of perihepatic ascites. Musculoskeletal: Not well evaluated IMPRESSION: No signs of choledocholithiasis without dilation of the biliary tree. See dedicated abdominal MRI for further details regarding liver and pancreas as well as associated findings of acute necrotic pancreatitis. Electronically Signed   By: Zetta Bills M.D.   On: 06/09/2022 15:54   NM Pulmonary Perfusion  Result Date: 06/09/2022 CLINICAL DATA:  Chest pain EXAM: NUCLEAR MEDICINE PERFUSION LUNG SCAN TECHNIQUE: Perfusion images were obtained in multiple projections after intravenous injection of radiopharmaceutical. Ventilation scans intentionally deferred if perfusion scan and chest x-ray adequate for interpretation during COVID 19  epidemic. RADIOPHARMACEUTICALS:  3.96 mCi Tc-47mMAA IV COMPARISON:  Chest x-ray dated June 08, 2022 FINDINGS: No large segmental perfusion defects. Small subsegmental perfusion defects seen in the right lung. IMPRESSION: Nondiagnostic (low or intermediate probability) Electronically Signed   By: LYetta GlassmanM.D.   On: 06/09/2022 14:25   MR ABDOMEN W WO CONTRAST  Result Date: 06/09/2022 CLINICAL DATA:  Abnormalities on recent CT evaluation. EXAM: MRI ABDOMEN WITHOUT AND WITH CONTRAST TECHNIQUE: Multiplanar multisequence MR imaging of the abdomen was performed both before and after the administration of intravenous contrast. CONTRAST:  136mGADAVIST GADOBUTROL 1 MMOL/ML IV SOLN COMPARISON:  Comparison made with CT evaluation of January 08, 2022. FINDINGS: Lower chest: Incidental imaging of the lung bases without effusion or consolidation. Hepatobiliary: Multi cystic appearing area in the RIGHT hepatic lobe within the distribution of portal venous thrombus showing variable perfusion in this area measuring 9.5 x 6.4 cm (image 33/13) portal venous thrombus to RIGHT superior and anterior portal venous branches subtending hepatic subsegment VIII and V. there are areas of intrinsic T1 hyperintensity that surround this lesion and are interspersed throughout the lesion. A second hypovascular lesion is demonstrated in the posterior LEFT hemiliver, lateral segment (image 28/13) also with intrinsic T1 hyperintensity measuring 17 mm. Perfusional changes in the area related to portal venous thrombus, less conspicuous on delayed phase. Hepatic cysts elsewhere in the liver. Mild pericholecystic fluid and edema in the gallbladder wall with mild enhancement of the gallbladder wall. Mild intrahepatic biliary duct distension. Subtle area of T2 hypointensity along the  distal common bile duct up to 2-3 mm is nonspecific on thick section non MRCP sequences. Note that the study was not performed as an MRCP. Signs of peribiliary  enhancement in the setting of acute pancreatitis, see below. Pancreas: Extensive peripancreatic and interstitial pancreatic edema compatible with pancreatitis. Diffuse edema and developing fluid without focal characteristics tracking throughout the anterior pararenal space and about the peripancreatic fat and lesser sac. The amount of fluid and edema is increased over the short interval since the CT which was performed 8 hours prior to this exam. Edema blunts the intrinsic T1 signal that is seen in the pancreas. An area of non enhancement is present in the tail of the pancreas. This measures 2.8 x 2.4 cm (image 47/17) an additional area of pancreatic necrosis present in the neck of the pancreas (image 52/17 measuring 2.4 cm in the axial plane. There is no sign of ductal dilation. Spleen: Splenic vein is patent. Spleen is normal size without focal lesion. Edema tracks towards the spleen in the LEFT upper quadrant, increased since previous imaging as discussed. Adrenals/Urinary Tract:  Adrenal glands are normal. Bosniak category I cyst in the lower pole the LEFT kidney measuring 4.7 cm for which no follow-up is recommended in the absence of localizing symptoms. No suspicious renal lesion or hydronephrosis. 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 Main portal vein is patent as are LEFT portal branches. SMV is patent and splenic vein is patent. Normal caliber of the abdominal aorta. No adenopathy in the abdomen. Other: Small volume ascites. Anterior pararenal fluid and edema measuring up to 9.8 x 2.8 cm on the RIGHT is currently nonfocal and is increased in volume also along the LEFT hemiabdomen tracking towards the spleen and throughout the anterior pararenal space. Musculoskeletal: No suspicious bone lesions identified. IMPRESSION: 1. Multi cystic area  in the RIGHT hepatic lobe following a vascular distribution, affected by portal venous thrombus, in isolation is most suspicious for developing micro abscesses in the setting of poor vascular flow related to portal thrombus and acute necrotic pancreatitis. There is however intrinsic T1 hyperintensity which can be seen in the setting of hemorrhage, mucin or melanin (melanoma, felt unlikely based on distribution and associated findings at this time). Alternative neoplastic considerations remain in the differential due to a second smaller lesion in the LEFT hepatic lobe which is away from the affected vascular distribution, also with intrinsic T1 hyperintensity. For this reason sampling of the area in the RIGHT hepatic lobe may be helpful to exclude underlying malignancy and obtain a sample for microbiologic assessment. Ultimately, 8-12 week follow-up after therapy even if biopsy is negative is suggested to ensure resolution. 2. Acute necrotic and edematous pancreatitis with worsening since previous imaging. Focal areas of necrosis can lead to ductal interruption and for this reason close follow-up is suggested. 3. Mild intrahepatic biliary duct distension. Subtle area of T2 hypointensity along the distal common bile duct cannot be confirmed as intraductal stone and may simply represent volume averaging but does raise the question of intraductal calculus. Dedicated MRCP may be helpful in the setting of worsening pancreatitis. 4. Gallbladder edema with under distension favored to be secondary to severe acute pancreatitis, attention on follow-up. Could consider HIDA scan as warranted if there is continued concern for concomitant cholecystitis. 5. Increase in anterior pararenal fluid since previous imaging related to worsening of pancreatitis. These results will  be called to the ordering clinician or representative by the Radiologist Assistant, and communication documented in the PACS or [employer]. Electronically  Signed   By: Zetta Bills M.D.   On: 06/09/2022 09:10   US ABDOMEN LIMITED RUQ (LIVER/GB)  Result Date: 06/08/2022 CLINICAL DATA:  Right upper quadrant abdominal pain EXAM: ULTRASOUND ABDOMEN LIMITED RIGHT UPPER QUADRANT COMPARISON:  None Available. FINDINGS: Gallbladder: The gallbladder wall is mildly thickened and there is trace pericholecystic fluid, nonspecific. This can be seen in the setting of acute calculus cholecystitis or inflammatory conditions of the biliary tree, as can be seen with cholangitis or pancreatitis. The gallbladder is not distended. No intraluminal stones or sludge is identified. The sonographic Percell Miller sign is reportedly negative. Common bile duct: Diameter: 2 mm in proximal diameter Liver: There is intrahepatic biliary ductal dilation noted within the right hepatic lobe and within the biliary hilum. There is superimposed heterogeneity of the hepatic echotexture in this region, best appreciated on # [id number] and and ill-defined mixed solid and cystic mass is difficult to exclude. Multiple additional simple cysts are identified within the left hepatic lobe. Portal vein is patent on color Doppler imaging with normal direction of blood flow towards the liver. Other: No ascites IMPRESSION: 1. Mild gallbladder wall thickening with trace pericholecystic fluid, nonspecific. No evidence for cholelithiasis or gallbladder sludge. 2. Intrahepatic biliary ductal dilation within the right hepatic lobe and biliary hilum. There is superimposed heterogeneity of the hepatic echotexture in this region and ill-defined mixed solid and cystic mass is difficult to exclude. Further evaluation with contrast-enhanced MRI is recommended. 3. Multiple additional simple cysts within the left hepatic lobe. Electronically Signed   By: Fidela Salisbury M.D.   On: 06/08/2022 18:17   CT ABDOMEN PELVIS W CONTRAST  Result Date: 06/08/2022 CLINICAL DATA:  Hematuria.  Hypotension.  Syncopal episode. EXAM: CT ABDOMEN AND  PELVIS WITH CONTRAST TECHNIQUE: Multidetector CT imaging of the abdomen and pelvis was performed using the standard protocol following bolus administration of intravenous contrast. RADIATION DOSE REDUCTION: This exam was performed according to the departmental dose-optimization program which includes automated exposure control, adjustment of the mA and/or kV according to patient size and/or use of iterative reconstruction technique. CONTRAST:  56m OMNIPAQUE IOHEXOL 300 MG/ML  SOLN COMPARISON:  None Available. FINDINGS: Lower Chest: No acute findings. Hepatobiliary: Multiple small benign-appearing hepatic cysts are noted. An ill-defined heterogeneous low-attenuation lesion is seen in the anterior segment the right hepatic lobe which measures approximately 9.1 x 6.4 cm on image 34/2. 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 No other suspicious hepatic masses are identified. Mild diffuse gallbladder wall thickening is seen, which could be secondary to acute pancreatitis although acute cholecystitis cannot definitely be excluded. Pancreas: Moderate acute pancreatitis is seen with diffuse peripancreatic inflammatory changes and small amount of fluid extending inferiorly in the right anterior pararenal space. No evidence of pancreatic necrosis or pseudocysts. No evidence of pancreatic mass. Spleen: Within normal limits in size and appearance. Adrenals/Urinary Tract: 2 small less than 1 cm right renal calculi are seen. No evidence of ureteral calculi or hydronephrosis. 5 cm benign-appearing left renal cyst noted (no followup imaging recommended). No masses identified. Stomach/Bowel: Small hiatal hernia noted. No evidence of obstruction, inflammatory process or abnormal fluid collections. Normal appendix visualized. Diverticulosis is seen mainly involving the descending and sigmoid colon, however there is no evidence of diverticulitis.  Vascular/Lymphatic: No pathologically enlarged lymph nodes. No acute  vascular findings. Aortic atherosclerotic calcification incidentally noted. Reproductive: Brachytherapy seeds noted throughout the prostate gland. Otherwise unremarkable. Other:  None. Musculoskeletal:  No suspicious bone lesions identified. IMPRESSION: Moderate acute pancreatitis. No evidence of pancreatic necrosis or pseudocysts. Mild diffuse gallbladder wall thickening likely secondary to acute pancreatitis, although acute cholecystitis cannot definitely be excluded. 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 Differential diagnosis includes neoplasm and abscess. Consider abdomen MRI without and with contrast for further characterization. Right nephrolithiasis. No evidence of ureteral calculi or hydronephrosis. Small hiatal hernia. Colonic diverticulosis, without radiographic evidence of diverticulitis. Aortic Atherosclerosis (ICD10-I70.0). Electronically Signed   By: Marlaine Hind M.D.   On: 06/08/2022 15:55   DG Chest Portable 1 View  Result Date: 06/08/2022 CLINICAL DATA:  Weakness.  Hypotension. EXAM: PORTABLE CHEST 1 VIEW COMPARISON:  August 25, 2018. FINDINGS: EKG leads project over the chest. Cardiomediastinal contours and hilar structures are stable accounting for AP projection and portable technique. No lobar consolidation.  No gross effusion on frontal radiograph. Linear areas of perihilar airspace disease in the RIGHT chest. On limited assessment there is no acute skeletal process. IMPRESSION: Linear areas of perihilar airspace disease in the RIGHT chest, likely atelectasis. Electronically Signed   By: Zetta Bills M.D.   On: 06/08/2022 14:07      Assessment/Plan 1.  Acute kidney injury  I discussed PermCath placement with the patient's wife as well as 2 reasons for the PermCath.  Initially the patient's wife refused noting that  he had a femoral access and she did not wish to move forward with any other additional access.  The wife then spoke with Dr. Mortimer Fries and is now agreeable to proceed.  2.  Diabetes mellitus type 2  Currently without complication.  Patient had well-controlled A1c of 6.2 prior to admission.   Family Communication:  Thank you for allowing [place] to participate in the care of this patient.   Kris Hartmann, NP [facility] [phone] (Office Phone) [phone] (Office Fax)  06/25/2022 12:33 AM  Staff may message me via secure chat in [place]  but this may not receive immediate response,  please page for urgent matters!  Dictation software was used to generate the above note. Typos may occur and escape review, as with typed/written notes. Any error is purely unintentional.  Please contact me directly for clarity if needed.

## 2022-06-25 NOTE — Interval H&P Note (Signed)
History and Physical Interval Note:  06/25/2022 3:44 PM  Xavier Esparza  has presented today for surgery, with the diagnosis of End-stage renal disease.  The various methods of treatment have been discussed with the patient and family. After consideration of risks, benefits and other options for treatment, the patient has consented to  Procedure(s): DIALYSIS/PERMA CATHETER INSERTION (N/A) as a surgical intervention.  The patient's history has been reviewed, patient examined, no change in status, stable for surgery.  I have reviewed the patient's chart and labs.  Questions were answered to the patient's satisfaction.     Leotis Pain

## 2022-06-25 NOTE — Progress Notes (Signed)
   Date of Admission:  06/08/2022   Total days of antibiotics  6/13>6/14 cefepime, flagyl and vanco 6/14>>06/17/22 - Piptazo 06/18/22 Meropenem 06/19/22- Fluconazole  ID: Xavier Esparza is a 76 y.o. male  Principal Problem:   Acute necrotizing pancreatitis Active Problems:   Diabetes mellitus without complication (HCC)   BPH (benign prostatic hyperplasia)   Nephrolithiasis   Portal vein thrombosis   AKI (acute kidney injury) ([place])   Severe sepsis (HCC)   Syncope   History of prostate cancer   Upper GI bleed   Acute blood loss anemia   Acute metabolic encephalopathy    Subjective: Low grade fever  Medications:   Chlorhexidine Gluconate Cloth  6 each Topical Q0600   feeding supplement (VITAL 1.5 CAL)  1,000 mL Per Tube Q24H   free water  20 mL Per Tube Q4H   heparin injection (subcutaneous)  5,000 Units Subcutaneous Q8H   heparin sodium (porcine)       insulin aspart  0-9 Units Subcutaneous Q4H   multivitamin  1 tablet Per Tube QHS   mouth rinse  15 mL Mouth Rinse 4 times per day   pantoprazole  40 mg Intravenous Q12H   sodium chloride flush  10-40 mL Intracatheter Q12H    Objective: Vital signs in last 24 hours: Temp:  [98.9 F (37.2 C)-100 F (37.8 C)] 100 F (37.8 C) (06/30 0915) Pulse Rate:  [29-98] 94 (06/30 0930) Resp:  [16-35] 26 (06/30 0930) BP: (97-134)/(54-94) 114/61 (06/30 0930) SpO2:  [87 %-100 %] 91 % (06/30 0930) Weight:  [96.9 kg-98.2 kg] 98.2 kg (06/30 0500)  LDA  HD cath rt femoral 06/19/22 PICC 06/17/22   PHYSICAL EXAM:  Could not see him as he was getting permacath  Lab Results Recent Labs    06/24/22 0613 06/24/22 1949 06/25/22 0531  WBC 25.8*  --  25.7*  HGB 7.6*  --  7.1*  HCT 23.4*  --  21.3*  NA 138 136 137  K 4.2 3.8 3.9  CL 107 99 103  CO2 '27 25 24  '$ BUN 51* 48* 71*  CREATININE 2.62* 3.20* 4.00*   Liver Panel Recent Labs    06/24/22 0613 06/24/22 1949 06/25/22 0531  PROT 5.9*  --   --   ALBUMIN 2.3* 2.3* 2.4*  AST 38   --   --   ALT 18  --   --   ALKPHOS 98  --   --   BILITOT 0.4  --   --     Microbiology: 06/08/22 BC NG 06/18/22 BC- NG  Radiology 06/24/22  No infiltrate   Possible ileus   Assessment/Plan: Acute necrotizing pancreatitis with multiorgan failure Peripancreatic edema Ascites Portal vein thrombosis on the right side Right hepatic microabscesses/phlegmon Pt currently on meropenem since  06/18/22 and he has not had temp > 100.  Also on fluconazole. Will need to treat the liver phlegmon with 3-4 weeks of antibiotic Leukocytosis secondary to necrotic pancreatitis, liver abscess--Leucocytosis persist  The etiology for pancreatitis is unclear. Could it be due to 2 doses of levaquin he took?  GI bleed secondary to duodenal ulcer.   No further bleeding   Encephalopathy mostly delirium.  Combination of pancreatitis, AKI, medications  AKI patient is now transitioned to HD  Anemia has received blood transfusion  Hypocalcemia secondary to pancreatitis causing very likely saponification of the fat over the pancreatic bed, omentum etc.  Discussed the management with the care team.

## 2022-06-25 NOTE — Assessment & Plan Note (Signed)
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  With low-grade fever on 6/23 and I repeated blood cultures which are negative for 3 days.  Repeat CT scan does not show any abscess on the pancreas but does show fluid collection still in the liver which could be an abscess.  Empirically on meropenem and Diflucan.  Tumor markers ordered including CEA normal range at 0.8 and normal alpha-fetoprotein at less than 1.8.  CA 19-9 within normal range currently on TPN.   Patient is critically ill.  Appreciate palliative care consultation. Patient having bowel movement now and started tolerating trickle feed -Slowly increased the rate of tube feeding -Continue with TPN

## 2022-06-26 ENCOUNTER — Inpatient Hospital Stay: Payer: Medicare Other

## 2022-06-26 DIAGNOSIS — K8591 Acute pancreatitis with uninfected necrosis, unspecified: Secondary | ICD-10-CM | POA: Diagnosis not present

## 2022-06-26 LAB — CBC WITH DIFFERENTIAL/PLATELET
Abs Immature Granulocytes: 1.08 10*3/uL — ABNORMAL HIGH (ref 0.00–0.07)
Basophils Absolute: 0.1 10*3/uL (ref 0.0–0.1)
Basophils Relative: 0 %
Eosinophils Absolute: 0.2 10*3/uL (ref 0.0–0.5)
Eosinophils Relative: 1 %
HCT: 18.7 % — ABNORMAL LOW (ref 39.0–52.0)
Hemoglobin: 6.2 g/dL — ABNORMAL LOW (ref 13.0–17.0)
Immature Granulocytes: 5 %
Lymphocytes Relative: 5 %
Lymphs Abs: 1.2 10*3/uL (ref 0.7–4.0)
MCH: 27.9 pg (ref 26.0–34.0)
MCHC: 33.2 g/dL (ref 30.0–36.0)
MCV: 84.2 fL (ref 80.0–100.0)
Monocytes Absolute: 2 10*3/uL — ABNORMAL HIGH (ref 0.1–1.0)
Monocytes Relative: 9 %
Neutro Abs: 18.6 10*3/uL — ABNORMAL HIGH (ref 1.7–7.7)
Neutrophils Relative %: 80 %
Platelets: 126 10*3/uL — ABNORMAL LOW (ref 150–400)
RBC: 2.22 MIL/uL — ABNORMAL LOW (ref 4.22–5.81)
RDW: 17.1 % — ABNORMAL HIGH (ref 11.5–15.5)
WBC: 23.1 10*3/uL — ABNORMAL HIGH (ref 4.0–10.5)
nRBC: 0.1 % (ref 0.0–0.2)

## 2022-06-26 LAB — GLUCOSE, CAPILLARY
Glucose-Capillary: 172 mg/dL — ABNORMAL HIGH (ref 70–99)
Glucose-Capillary: 180 mg/dL — ABNORMAL HIGH (ref 70–99)
Glucose-Capillary: 184 mg/dL — ABNORMAL HIGH (ref 70–99)
Glucose-Capillary: 199 mg/dL — ABNORMAL HIGH (ref 70–99)
Glucose-Capillary: 203 mg/dL — ABNORMAL HIGH (ref 70–99)
Glucose-Capillary: 215 mg/dL — ABNORMAL HIGH (ref 70–99)

## 2022-06-26 LAB — RENAL FUNCTION PANEL
Albumin: 2.6 g/dL — ABNORMAL LOW (ref 3.5–5.0)
Anion gap: 9 (ref 5–15)
BUN: 72 mg/dL — ABNORMAL HIGH (ref 8–23)
CO2: 25 mmol/L (ref 22–32)
Calcium: 8.3 mg/dL — ABNORMAL LOW (ref 8.9–10.3)
Chloride: 101 mmol/L (ref 98–111)
Creatinine, Ser: 4.65 mg/dL — ABNORMAL HIGH (ref 0.61–1.24)
GFR, Estimated: 12 mL/min — ABNORMAL LOW (ref >60)
Glucose, Bld: 201 mg/dL — ABNORMAL HIGH (ref 70–99)
Phosphorus: 4.4 mg/dL (ref 2.5–4.6)
Potassium: 4 mmol/L (ref 3.5–5.1)
Sodium: 135 mmol/L (ref 135–145)

## 2022-06-26 LAB — HEMOGLOBIN AND HEMATOCRIT, BLOOD
HCT: 22.2 % — ABNORMAL LOW (ref 39.0–52.0)
Hemoglobin: 7.4 g/dL — ABNORMAL LOW (ref 13.0–17.0)

## 2022-06-26 LAB — PREPARE RBC (CROSSMATCH)

## 2022-06-26 LAB — MAGNESIUM: Magnesium: 2.1 mg/dL (ref 1.7–2.4)

## 2022-06-26 MED ORDER — BOOST / RESOURCE BREEZE PO LIQD CUSTOM
1.0000 | Freq: Three times a day (TID) | ORAL | Status: DC
  Administered 2022-06-26 – 2022-06-27 (×4): 1 via ORAL

## 2022-06-26 MED ORDER — TRACE MINERALS CU-MN-SE-ZN 300-55-60-3000 MCG/ML IV SOLN
INTRAVENOUS | Status: AC
  Filled 2022-06-26: qty 806.4

## 2022-06-26 MED ORDER — SODIUM CHLORIDE 0.9% IV SOLUTION
Freq: Once | INTRAVENOUS | Status: AC
Start: 2022-06-26 — End: 2022-06-26

## 2022-06-26 MED ORDER — IOHEXOL 9 MG/ML PO SOLN
500.0000 mL | Freq: Once | ORAL | Status: DC | PRN
  Administered 2022-06-26: 500 mL via ORAL

## 2022-06-26 NOTE — Progress Notes (Signed)
[facility]  ROUNDING NOTE   Subjective:  Right IJ PermCath placed yesterday. Patient underwent hemodialysis treatment yesterday. Plan was to hold dialysis over the weekend.   Objective:  Vital signs in last 24 hours:  Temp:  [98 F (36.7 C)-100 F (37.8 C)] 98 F (36.7 C) (07/01 0800) Pulse Rate:  [40-106] 93 (07/01 0900) Resp:  [18-35] 26 (07/01 0900) BP: (97-138)/(50-90) 114/55 (07/01 0900) SpO2:  [73 %-100 %] 100 % (07/01 0900) Weight:  [97.9 kg-99.6 kg] 97.9 kg (07/01 0456)  Weight change: 2.5 kg Filed Weights   06/25/22 1112 06/25/22 1412 06/26/22 0456  Weight: 99.6 kg 99.4 kg 97.9 kg    Intake/Output: I/O last 3 completed shifts: In: 4518.9 [I.V.:2727.3; NG/GT:1040; IV Piggyback:751.6] Out: 833 [Other:433; Stool:400]   Intake/Output this shift:  Total I/O In: 393 [I.V.:140; NG/GT:252.9] Out: -   Physical Exam: General: No acute distress  Head: Normocephalic, atraumatic. Dry oral mucosal membranes  Eyes: Anicteric  Lungs:  Diminished in bases, normal effort, [place] O2  Heart: Regular rate and rhythm  Abdomen:  Firm, distended, mild tenderness  Extremities: Trace peripheral edema.  Neurologic: Awake, alert, confused  Skin: Warm, dry  Access: Femoral  dialysis catheter 06/19/2022.  Right IJ PermCath 03/22/7123    Basic Metabolic Panel: Recent Labs  Lab 06/22/22 0458 06/22/22 1550 06/23/22 0408 06/23/22 1606 06/24/22 0613 06/24/22 1353 06/24/22 1949 06/25/22 0531 06/26/22 0501  NA 139   < > 139 138 138  --  136 137 135  K 3.8   < > 4.0 3.9 4.2  --  3.8 3.9 4.0  CL 108   < > 109 107 107  --  99 103 101  CO2 26   < > '24 25 27  '$ --  '25 24 25  '$ GLUCOSE 175*   < > 192* 167* 189*  --  160* 184* 201*  BUN 52*   < > 53* 50* 51*  --  48* 71* 72*  CREATININE 3.01*   < > 2.99* 2.62* 2.62*  --  3.20* 4.00* 4.65*  CALCIUM 8.1*   < > 8.2* 8.1* 8.2*  --  8.1* 8.5* 8.3*  MG 2.3  --  2.4  --  2.4  --   --  2.0 2.1  PHOS  --    < > 2.0* 1.7* 1.4* 2.7 4.1   4.1 4.1 4.4   < > = values in this interval not displayed.     Liver Function Tests: Recent Labs  Lab 06/19/22 1539 06/20/22 0352 06/21/22 0414 06/21/22 1555 06/23/22 1606 06/24/22 0613 06/24/22 1949 06/25/22 0531 06/26/22 0501  AST 41  --  45*  --   --  38  --   --   --   ALT 32  --  29  --   --  18  --   --   --   ALKPHOS 89  --  112  --   --  98  --   --   --   BILITOT 0.6  --  0.4  --   --  0.4  --   --   --   PROT 5.3*  --  5.7*  --   --  5.9*  --   --   --   ALBUMIN 2.0*   < > 2.0*  2.1*   < > 1.9* 2.3* 2.3* 2.4* 2.6*   < > = values in this interval not displayed.    No results for input(s): "  LIPASE", "AMYLASE" in the last 168 hours. Recent Labs  Lab 06/19/22 1500  AMMONIA 20      CBC: Recent Labs  Lab 06/22/22 0458 06/22/22 2228 06/23/22 0408 06/23/22 1750 06/24/22 0613 06/25/22 0531 06/26/22 0501  WBC 23.8*  --  23.9*  --  25.8* 25.7* 23.1*  NEUTROABS 19.5*  --  19.5*  --  21.4* 21.2* 18.6*  HGB 7.3*   < > 8.2* 7.8* 7.6* 7.1* 6.2*  HCT 22.6*   < > 24.5* 24.0* 23.4* 21.3* 18.7*  MCV 85.6  --  85.7  --  85.4 84.9 84.2  PLT 203  --  199  --  175 179 126*   < > = values in this interval not displayed.     Cardiac Enzymes: No results for input(s): "CKTOTAL", "CKMB", "CKMBINDEX", "TROPONINI" in the last 168 hours.   BNP: Invalid input(s): "POCBNP"  CBG: Recent Labs  Lab 06/25/22 1754 06/25/22 1941 06/25/22 2314 06/26/22 0332 06/26/22 0755  GLUCAP 131* 172* 159* 184* 172*     Microbiology: Results for orders placed or performed during the hospital encounter of 06/08/22  Blood culture (routine x 2)     Status: None   Collection Time: 06/08/22  1:33 PM   Specimen: BLOOD  Result Value Ref Range Status   Specimen Description BLOOD LEFT HAND  Final   Special Requests   Final    BOTTLES DRAWN AEROBIC AND ANAEROBIC Blood Culture results may not be optimal due to an inadequate volume of blood received in culture bottles   Culture   Final     NO GROWTH 5 DAYS Performed at [facility], [address], [place], [place] [address]    Report Status 06/13/2022 FINAL  Final  Blood culture (routine x 2)     Status: None   Collection Time: 06/08/22  2:15 PM   Specimen: BLOOD  Result Value Ref Range Status   Specimen Description BLOOD BLOOD LEFT WRIST  Final   Special Requests   Final    BOTTLES DRAWN AEROBIC AND ANAEROBIC Blood Culture results may not be optimal due to an inadequate volume of blood received in culture bottles   Culture   Final    NO GROWTH 5 DAYS Performed at [facility], [address] [place], [place] [address]    Report Status 06/13/2022 FINAL  Final  MRSA Next Gen by PCR, Nasal     Status: None   Collection Time: 06/10/22  4:14 PM   Specimen: Nasal Mucosa; Nasal Swab  Result Value Ref Range Status   MRSA by PCR Next Gen NOT DETECTED NOT DETECTED Final    Comment: (NOTE) The GeneXpert MRSA Assay (FDA approved for NASAL specimens only), is one component of a comprehensive MRSA colonization surveillance program. It is not intended to diagnose MRSA infection nor to guide or monitor treatment for MRSA infections. Test performance is not FDA approved in patients less than 11 years old. Performed at [facility], [address] [place], [place] [address]   SARS Coronavirus 2 by RT PCR (hospital order, performed in [facility] hospital lab) *cepheid single result test* Anterior Nasal Swab     Status: None   Collection Time: 06/10/22  5:17 PM   Specimen: Anterior Nasal Swab  Result Value Ref Range Status   SARS Coronavirus 2 by RT PCR NEGATIVE NEGATIVE Final    Comment: (NOTE) SARS-CoV-2 target nucleic acids are NOT DETECTED.  The SARS-CoV-2 RNA is generally detectable in upper  and lower respiratory specimens during the acute phase of infection. The lowest concentration of SARS-CoV-2 viral copies this assay can detect is 250 copies / mL. A negative result does not preclude  SARS-CoV-2 infection and should not be used as the sole basis for treatment or other patient management decisions.  A negative result may occur with improper specimen collection / handling, submission of specimen other than nasopharyngeal swab, presence of viral mutation(s) within the areas targeted by this assay, and inadequate number of viral copies (<250 copies / mL). A negative result must be combined with clinical observations, patient history, and epidemiological information.  Fact Sheet for Patients:   https://www.patel.info/  Fact Sheet for Healthcare Providers: https://hall.com/  This test is not yet approved or  cleared by the [place] FDA and has been authorized for detection and/or diagnosis of SARS-CoV-2 by FDA under an Emergency Use Authorization (EUA).  This EUA will remain in effect (meaning this test can be used) for the duration of the COVID-19 declaration under Section 564(b)(1) of the Act, 21 U.S.C. section 360bbb-3(b)(1), unless the authorization is terminated or revoked sooner.  Performed at [facility], [address] [place], [place] [address]   Culture, blood (Routine X 2) w Reflex to ID Panel     Status: None   Collection Time: 06/18/22 10:03 AM   Specimen: BLOOD  Result Value Ref Range Status   Specimen Description BLOOD RIGHT HAND  Final   Special Requests   Final    BOTTLES DRAWN AEROBIC ONLY Blood Culture adequate volume   Culture   Final    NO GROWTH 5 DAYS Performed at [facility], [address] [place], [place] [address]    Report Status 06/23/2022 FINAL  Final  Culture, blood (Routine X 2) w Reflex to ID Panel     Status: None   Collection Time: 06/18/22 10:20 AM   Specimen: BLOOD  Result Value Ref Range Status   Specimen Description BLOOD LEFT [employer]  Final   Special Requests   Final    BOTTLES DRAWN AEROBIC AND ANAEROBIC Blood Culture adequate volume   Culture   Final     NO GROWTH 5 DAYS Performed at [facility], [address], [place], [place] [address]    Report Status 06/23/2022 FINAL  Final    Coagulation Studies: No results for input(s): "LABPROT", "INR" in the last 72 hours.   Urinalysis: No results for input(s): "COLORURINE", "LABSPEC", "PHURINE", "GLUCOSEU", "HGBUR", "BILIRUBINUR", "KETONESUR", "PROTEINUR", "UROBILINOGEN", "NITRITE", "LEUKOCYTESUR" in the last 72 hours.  Invalid input(s): "APPERANCEUR"     Imaging: PERIPHERAL VASCULAR CATHETERIZATION  Result Date: 06/25/2022 See surgical note for result.  US Abdomen Limited RUQ (LIVER/GB)  Result Date: 06/24/2022 CLINICAL DATA:  Liver abscess EXAM: ULTRASOUND ABDOMEN LIMITED RIGHT UPPER QUADRANT COMPARISON:  CT 06/18/2022, 06/11/2022, MRI 06/08/2022 FINDINGS: Gallbladder: No gallstones or wall thickening visualized. No sonographic Murphy sign noted by sonographer. Common bile duct: Diameter: 6.1 mm Liver: Heterogenous echotexture. Cyst in the right posterior hepatic lobe measuring 3.2 x 5.2 x 2.9 cm. Multiple additional cysts within the liver, including septated cyst in the left hepatic lobe measuring 3.7 x 4.3 x 4 cm. No discrete abscess collection to correspond to the questioned area on CT and MRI. Portal vein is patent on color Doppler imaging with normal direction of blood flow towards the liver. Other: None. IMPRESSION: 1. No definite drainable abscess collection by ultrasound to correspond to the areas seen on CT and MRI. 2. There are  multiple liver cysts both simple and complex, see MRI 06/08/2022. Electronically Signed   By: Donavan Foil M.D.   On: 06/24/2022 16:47     Medications:    sodium chloride Stopped (06/25/22 1829)   albumin human 25 g (06/26/22 0935)   meropenem (MERREM) IV Stopped (06/25/22 1859)   TPN ADULT (ION) 70 mL/hr at 06/26/22 0800   TPN ADULT (ION)      sodium chloride   Intravenous Once   Chlorhexidine Gluconate Cloth  6 each Topical Q0600    feeding supplement (PROSource TF)  45 mL Per Tube BID   feeding supplement (VITAL 1.5 CAL)  1,000 mL Per Tube Q24H   free water  20 mL Per Tube Q4H   heparin injection (subcutaneous)  5,000 Units Subcutaneous Q8H   insulin aspart  0-15 Units Subcutaneous Q4H   multivitamin  1 tablet Per Tube QHS   mouth rinse  15 mL Mouth Rinse 4 times per day   pantoprazole  40 mg Intravenous Q12H   sodium chloride flush  10-40 mL Intracatheter Q12H   sodium chloride, acetaminophen **OR** acetaminophen, alteplase, guaiFENesin-dextromethorphan, haloperidol lactate, heparin, HYDROmorphone (DILAUDID) injection, HYDROmorphone (DILAUDID) injection, hydrOXYzine, ipratropium-albuterol, lidocaine (PF), lidocaine-prilocaine, lip balm, ondansetron (ZOFRAN) IV, mouth rinse, mouth rinse, oxyCODONE, pentafluoroprop-tetrafluoroeth, QUEtiapine, sodium chloride flush  Assessment/ Plan:  Xavier Esparza is a 76 y.o.  male with past medical history consistent of prostate cancer, diabetes, recurrent kidney stones with lithotripsy and BPH, who was admitted to [facility] on 06/08/2022 for Portal vein thrombosis [I81] Lactic acidosis [E87.20] Necrotizing pancreatitis [K85.91] Liver mass [R16.0] Syncope, unspecified syncope type [R55] Leukocytosis, unspecified type [D72.829] Acute pancreatitis, unspecified complication status, unspecified pancreatitis type [K85.90]   Acute kidney injury with gross hematuria likely secondary to severe illness and IV contrast exposure.  Normal kidney function prior to admission.  IV contrast exposure on 06/08/2022 and 06/09/2022.  Renal ultrasound negative for obstruction.  Urinalysis shows hematuria and mild proteinuria.   ?  Gross hematuria related to Foley catheter.  Patient has history of prostate cancer.   Received first dialysis treatment on June 16.   Patient required CRRT starting June 22 and stopped on 06/24/22  Patient underwent hemodialysis treatment yesterday.  Holding dialysis over the  weekend.  Monitor creatinine trend.  It is slightly higher today.  Minimal urine output.   Lab Results  Component Value Date   CREATININE 4.65 (H) 06/26/2022   CREATININE 4.00 (H) 06/25/2022   CREATININE 3.20 (H) 06/24/2022    Intake/Output Summary (Last 24 hours) at 06/26/2022 1039 Last data filed at 06/26/2022 0935 Gross per 24 hour  Intake 3209.29 ml  Output 433 ml  Net 2776.29 ml    Acute pancreatitis, severe necrotizing.  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   Patient has been on meropenem and antifungal treatment.  Remains on TPN.   Diabetes mellitus type 2 without complication.Home regimen includes metformin.  Hemoglobin A1c 6.2 on 01/08/2022.  -Management of blood glucose per primary team.  Acute ileus, receiving IV narcotics for severe acute pancreatitis. Tube feeds stopped and NGT placed to LWIS. Rectal tube placed  EGD showed 3 gastric ulcers, no active bleeding noted.       LOS: 18 Xavier Esparza 7/1/202310:39 AM

## 2022-06-26 NOTE — Progress Notes (Signed)
Progress Note   Patient: Xavier Esparza UEA:[id number] DOB: [birth year] DOA: 06/08/2022     18 DOS: the patient was seen and examined on 06/26/2022   Brief hospital course: 76 y.o. male with medical history significant for DM, BPH, history of prostate cancer, kidney stones status post lithotripsy, negative colonoscopy 2022 (3 benign polyps), who presents to the ED by EMS following a syncopal episode at home while walking to the bathroom.  Wife said she heard a loud thud and went to find him on the floor.  She said he was awake when she got there but he could not help himself up and she called 911.  Patient states he was in his usual state of health but about 2 weeks prior he saw a little blood in his urine similar to when he had his kidney stone.  He saw his urologist yesterday who gave him a prescription for Levaquin.  He otherwise denied feeling unwell previous to the episode.  He denied chest pain, shortness of breath, headache or visual disturbance, one-sided numbness weakness or tingling, nausea or vomiting, abdominal pain, urinary frequency or burning or diarrhea.[facility] reported BP 76/45 and he was treated with IM epinephrine and a 400 mL fluid bolus.  He was awake and alert x4 by arrival.  After his arrival in the emergency room he developed abdominal pain and vomiting.   Initial CT abdomen pelvis with moderate acute pancreatitis no evidence of pancreatic necrosis or pseudocyst.  Follow-up MRI abdomen concerning for possible choledocholithiasis with evidence of necrotizing pancreatitis.  Case discussed with GI Dr. Marius Ditch at [facility].   There was mention of possible portal vein thrombosis.  Heparin GTT was initially started however patient developed suspected acute hematuria so heparin GTT was stopped.  VQ scan ordered and perfusion component will be completed prior to patient transfer.  6/15: Clinical status overall unchanged.  Patient remains with abdominal pain associated with nausea.  Hemodynamically  stable.  Mild elevation in white count.  Lipase and LFTs have improved over interval.  Discussed case with GI and general surgery.  Likely that patient's presentation is secondary to necrotizing pancreatitis.  Currently as there is no CBD stone and there is no strong indication for transfer for ERCP.  From a surgical standpoint no intervention is recommended.  There is nothing from an interventional radiology standpoint to drain.  As such we will discontinue transfer and keep patient in [facility].   6/16: No appreciable change in clinical status.  LFTs are downtrending.  White count slightly downtrending.  No fevers noted over interval.  Increasing abdominal distention noted.  Ascites and peripancreatic fluid seen on repeat CAT scan today.  Discussed case with PCCM, nephrology, GI.  Will attempt postpyloric feedings.  Kidney function continues to deteriorate.  Nephrology will initiate hemodialysis today.   6/17: No change in clinical status.  White count remains minimally elevated.  No fevers over interval.  Remains with abdominal distention.  Started HD yesterday.  We will run another round of HD today.   6/18: No appreciable clinical changes.  White blood cell count slightly downtrending.  Creatinine worsened despite 2 rounds of hemodialysis.  Remains with hematuria despite stopping heparin 2 days ago.   6/19: Increasing abd distention.  KUB consistent with ileus.  [place] negative for ascites   6/20: Postpyloric Dobbhoff discontinued.  NGT placed.  Tip in stomach. Set to low intermittent suction.  Rectal tube in place.  Abdominal distention improved.  White blood cell count increased to 22  For the week of 6/21 through 06/22/2022:  the patient had an upper GI bleed and was transfused 3 units of packed red blood cells.  He still remains anemic.  EGD showing gastric ulcers.  Patient was initially on Protonix drip now on Protonix twice a day.  Last hemoglobin 7.3.  CRRT was started on 06/17/2022 and continuing  as per nephrology.  The patient is still anuric.  The patient severe necrotizing pancreatitis.  Repeat CT scan showed increased nodularity in the omentum.  No discrete abscess about the pancreas.  Did see possible liver abscess.  Antibiotics were changed over to meropenem and Diflucan.  Repeat blood cultures on 06/18/2022 are negative.  The patient did have an ileus on x-ray so tube feeds were stopped.  Patient is on TPN and has a PICC line.  The patient's mental status has waxed and waned during the week and CT scan of the head was negative.  6/28: Patient continued to experience abdominal pain.  Nephrology is planning to continue CRRT for now.  Continue to have NG tube but secretions are decreasing.  Remained on TPN.  6/29: Clinically seems stable.  Continue to require pain medications.  Nephrology is going to start him on intermittent dialysis and stopping CRRT.  Also trying trickle feeds through NG tube as NG tube secretions remained minimal. Hypophosphatemia which is being repleted. Anemia panel consistent with anemia of chronic disease with some iron deficiency. Ferritin levels elevated. We will start him on iron supplement once tube feed established.  6/30: Patient with very slow improvement.  Able to tolerate trickle food, rate was increased today. Started on intermittent dialysis.  Going for permanent HD catheter today.  7/1: Patient continued to have low-grade fever at 100.  Had his HD catheter placed by vascular surgery yesterday.  Hemoglobin dropped to 6.2 this morning, getting 1 unit of PRBC. Ordered repeat CT abdomen due to persistent low-grade fever and leukocytosis. NG tube was pulled out by patient, will try p.o. intake before replacing.   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  With low-grade fever on 6/23 and I repeated blood cultures which are negative for 3 days.  Repeat CT scan  does not show any abscess on the pancreas but does show fluid collection still in the liver which could be an abscess.  Empirically on meropenem and Diflucan.  Tumor markers ordered including CEA normal range at 0.8 and normal alpha-fetoprotein at less than 1.8.  CA 19-9 within normal range currently on TPN.   Patient is critically ill.  Appreciate palliative care consultation.  Patient slowly improving and tolerating NG feeds, pulled out his NG tube today. Persistent low-grade fever and leukocytosis. -Repeat CT abdomen to rule out any abscess -Try p.o. intake after getting swallow evaluation -Continue with TPN  Acute blood loss anemia Last hemoglobin 6.9.  No obvious bleeding  Patient received 3 units of packed red blood cells during the hospital course.  Had GI bleed on 06/16/2022.  Having some blood loss with the continuous dialysis if the filter gets clotted and unable to return blood. Anemia panel consistent with anemia of chronic disease with some iron deficiency. -1 more unit of PRBC ordered -Will start him on iron supplement once tube feed get established   Upper GI bleed Secondary to duodenal ulcers.  No active bleeding on EGD.  Monitor closely with heparin with dialysis.  On Protonix IV twice daily  Severe sepsis ([place]) Present  on admission secondary to severe necrotizing pancreatitis.  Empirically on meropenem and Diflucan.  Repeat blood cultures negative.  Acute metabolic encephalopathy Mental status waxes and wanes..  Will give as needed low-dose Seroquel at night. Need to continue pain medications.  AKI (acute kidney injury) [facility]) Patient started on CRRT on 06/17/2022.  Critical care team took out the catheter of the neck and put a catheter in the right groin for better CRRT on 6/24.  Patient still anuric. -Patient was started on intermittent dialysis today. -Going for permanent HD catheter placement  Portal vein thrombosis Unable to do anticoagulation with drop in hemoglobin  and upper GI bleed.  Syncope Secondary to hypotension   Nephrolithiasis Nonobstructing right renal stone.  No acute issues suspected  History of prostate cancer No acute issues  BPH (benign prostatic hyperplasia) No acute issues  Diabetes mellitus without complication (HCC) Sugars elevated while on TPN.  On low-dose Semglee insulin at night.  Can probably get rid of Semglee insulin once TPN discontinued.     Subjective: Patient is feeling much improved when seen today.  Denies any nausea, vomiting.  Abdominal pain improving.  Tolerating NG tube.  Wants to try p.o.  Physical Exam: Vitals:   06/26/22 1411 06/26/22 1500 06/26/22 1600 06/26/22 1700  BP: 106/60 119/63 124/67 (!) 123/58  Pulse: (!) 46 95 93 93  Resp: (!) 29 (!) 29 (!) 29 (!) 31  Temp: 99 F (37.2 C)  98 F (36.7 C)   TempSrc: Oral  Oral   SpO2: 93% 95% 98% 94%  Weight:      Height:       General.  Ill-appearing gentleman, in no acute distress. Pulmonary.  Lungs clear bilaterally, normal respiratory effort. CV.  Regular rate and rhythm, no JVD, rub or murmur. Abdomen.  Soft, nontender, nondistended, BS positive. CNS.  Alert and oriented .  No focal neurologic deficit. Extremities.  No edema, no cyanosis, pulses intact and symmetrical. Psychiatry.  Judgment and insight appears normal.  Data Reviewed: Prior data reviewed  Family Communication: Discussed with wife at bedside  Disposition: Status is: Inpatient Remains inpatient appropriate because: Severity of illness   Planned Discharge Destination: To be determined  Time spent: 50 minutes  This record has been created using Systems analyst. Errors have been sought and corrected,but may not always be located. Such creation errors do not reflect on the standard of care.  Author: Lorella Nimrod, MD 06/26/2022 5:14 PM  For on call review [email or web address].

## 2022-06-26 NOTE — Progress Notes (Signed)
PHARMACY - TOTAL PARENTERAL NUTRITION CONSULT NOTE   Indication: without adequate nutrition for > 7 days / ileus  Patient Measurements: Height: 6' (182.9 cm) Weight: 97.9 kg (215 lb 13.3 oz) IBW/kg (Calculated) : 77.6 TPN AdjBW (KG): 83.7 Body mass index is 29.27 kg/m.  Assessment: 76 y/o male with h/o DM, BPH, prostate cancer, hiatal hernia, kidney stones and HTN who is admitted with severe necrotizing pancreatitis, portal vein thrombosis, liver lesions, AKI and sepsis.  Glucose / Insulin: BG 131 - 184 (14 units SSI required) Electrolytes: Hypophosphatemia secondary to CRRT Renal: CRRT stopped 6/28 PM and transitioned to iHD (first HD 6/29) Hepatic: Transaminitis resolved. LFTs within normal limits Intake / Output; MIVF: + 2L for the admission GI Imaging: 06/21/22 Abd Xray shows diffuse gaseous distention of bowel compatible with ileus GI Surgeries / Procedures: none recent  Central access: 06/17/22  TPN start date: 06/17/22   Nutritional Goals: Goal TPN rate is 70 mL/hr (provides 121 g of protein and 2241 kcals per day)  RD Assessment: Estimated Needs Total Energy Estimated Needs: 2200-2500kcal/day Total Protein Estimated Needs: 120-135g/day Total Fluid Estimated Needs: 2.1-2.4Lday  Current Nutrition:  Tube feeding at 10 mL/hr started 6/29 Plan is to continue to increase as tolerated to goal 65 mL/hr  Plan:  Modify TPN to 70 mL/hr --Changes made to decrease total volume provided via TPN given anuric requiring HD to avoid fluid overload Nutritional components: Amino acids (using 15% Clinisol): 121 g Dextrose: 319 g Lipids (using 20% SMOFlipids): 67 g kCal 2241 Volume: 1680 mL (1780 mL with overfill) Electrolytes in TPN: Na 10mq/L, K 250m/L, Ca 8m69mL, Mg 1mE28m, and Phos 1 mmol/L. Cl:Ac 1:1 Add standard MVI and trace elements to TPN Increase to moderate q4h SSI and adjust as needed  Continue 20u insulin in TPN per diabetes coordinator recommendations Monitor TPN  labs on Mon/Thurs, daily until stable  [name]/2023,6:00 AM

## 2022-06-26 NOTE — Assessment & Plan Note (Signed)
Last hemoglobin 6.9.  No obvious bleeding  Patient received 3 units of packed red blood cells during the hospital course.  Had GI bleed on 06/16/2022.  Having some blood loss with the continuous dialysis if the filter gets clotted and unable to return blood. Anemia panel consistent with anemia of chronic disease with some iron deficiency. -1 more unit of PRBC ordered -Will start him on iron supplement once tube feed get established

## 2022-06-26 NOTE — Progress Notes (Signed)
RN notified Dr. Reesa Chew that patient pulled out his NG tube. MD acknowledged and gave order for clear liquid diet, dietician consult and for patient to travel to CT scan without nurse.

## 2022-06-26 NOTE — Progress Notes (Deleted)
Dr. Saralyn Pilar gave order that patient may get out of bed if she feels well enough to do so.

## 2022-06-26 NOTE — Assessment & Plan Note (Signed)
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  With low-grade fever on 6/23 and I repeated blood cultures which are negative for 3 days.  Repeat CT scan does not show any abscess on the pancreas but does show fluid collection still in the liver which could be an abscess.  Empirically on meropenem and Diflucan.  Tumor markers ordered including CEA normal range at 0.8 and normal alpha-fetoprotein at less than 1.8.  CA 19-9 within normal range currently on TPN.   Patient is critically ill.  Appreciate palliative care consultation.  Patient slowly improving and tolerating NG feeds, pulled out his NG tube today. Persistent low-grade fever and leukocytosis. -Repeat CT abdomen to rule out any abscess -Try p.o. intake after getting swallow evaluation -Continue with TPN

## 2022-06-26 NOTE — Progress Notes (Signed)
Nutrition Follow-up  DOCUMENTATION CODES:   Obesity unspecified  INTERVENTION:   -TPN management per pharmacy -D/c Prosource TF, due to no current feeding access -Boost Breeze po TID, each supplement provides 250 kcal and 9 grams of protein  -RD will follow for diet advancement and adjust supplement regimen as appropriate -Continue renal MVI daily  NUTRITION DIAGNOSIS:   Inadequate oral intake related to acute illness as evidenced by NPO status.  Ongoing  GOAL:   Patient will meet greater than or equal to 90% of their needs  Met with TPN  MONITOR:   Diet advancement, Labs, Weight trends, TF tolerance, Skin, I & O's  REASON FOR ASSESSMENT:   Consult Enteral/tube feeding initiation and management  ASSESSMENT:   76 y/o male with h/o DM, BPH, prostate cancer, hiatal hernia, kidney stones and HTN who is admitted with severe necrotizing pancreatitis, portal vein thrombosis, liver lesions, AKI and sepsis.  - Pt s/p initiation of HD 6/16 -Pt s/p EGD 6/23; pt noted to have gastric and duodenal ulcers -Pt s/p TPN initiation 6/22  7/1- pt pulled out NGT, TF d/c, advanced to clear liquid diet  Reviewed I/O's: +3 L x 24 hours and +2.8 L since 06/12/22   Pt just advanced to clear liquid diet. No meal completion data available to assess at this time. MD requesting assistance with selections supplements.   Pt remains on TPN, currently receiving at 70 ml/hr, which provides 2241 kcals and 121 grams protein, which meets 100% of needs. Rate decreased yesterday due to concern for fluid overload in the setting of HD.   Medications reviewed and include IV albumin  Labs reviewed: CBGS: 172-203 (inpatient orders for glycemic control are 0-15 units insulin aspart every 4 hours and 20 units regular insulin in TPN bag).    Diet Order:   Diet Order             Diet clear liquid Room service appropriate? Yes; Fluid consistency: Thin  Diet effective now           Diet - low sodium heart  healthy                   EDUCATION NEEDS:   Education needs have been addressed  Skin:  Skin Assessment: Reviewed RN Assessment  Last BM:  06/25/22  Height:   Ht Readings from Last 1 Encounters:  06/10/22 6' (1.829 m)    Weight:   Wt Readings from Last 1 Encounters:  06/26/22 97.9 kg    Ideal Body Weight:  80.9 kg  BMI:  Body mass index is 29.27 kg/m.  Estimated Nutritional Needs:   Kcal:  2200-2500kcal/day  Protein:  120-135g/day  Fluid:  2.1-2.4Lday    Loistine Chance, RD, LDN, [place] Registered Dietitian II Certified Diabetes Care and Education Specialist Please refer to [facility] for RD and/or RD on-call/weekend/after hours pager

## 2022-06-27 DIAGNOSIS — K8591 Acute pancreatitis with uninfected necrosis, unspecified: Secondary | ICD-10-CM | POA: Diagnosis not present

## 2022-06-27 LAB — TYPE AND SCREEN
ABO/RH(D): O POS
Antibody Screen: NEGATIVE
Unit division: 0
Unit division: 0

## 2022-06-27 LAB — GLUCOSE, CAPILLARY
Glucose-Capillary: 187 mg/dL — ABNORMAL HIGH (ref 70–99)
Glucose-Capillary: 203 mg/dL — ABNORMAL HIGH (ref 70–99)
Glucose-Capillary: 188 mg/dL — ABNORMAL HIGH (ref 70–99)
Glucose-Capillary: 200 mg/dL — ABNORMAL HIGH (ref 70–99)
Glucose-Capillary: 218 mg/dL — ABNORMAL HIGH (ref 70–99)
Glucose-Capillary: 200 mg/dL — ABNORMAL HIGH (ref 70–99)

## 2022-06-27 LAB — BPAM RBC
Blood Product Expiration Date: 202307262359
Blood Product Expiration Date: 202308042359
ISSUE DATE / TIME: 202306280113
ISSUE DATE / TIME: 202307011114
Unit Type and Rh: 5100
Unit Type and Rh: 5100

## 2022-06-27 LAB — CBC WITH DIFFERENTIAL/PLATELET
Abs Immature Granulocytes: 0.66 10*3/uL — ABNORMAL HIGH (ref 0.00–0.07)
Basophils Absolute: 0.1 10*3/uL (ref 0.0–0.1)
Basophils Relative: 0 %
Eosinophils Absolute: 0.1 10*3/uL (ref 0.0–0.5)
Eosinophils Relative: 0 %
HCT: 20.3 % — ABNORMAL LOW (ref 39.0–52.0)
Hemoglobin: 7 g/dL — ABNORMAL LOW (ref 13.0–17.0)
Immature Granulocytes: 3 %
Lymphocytes Relative: 5 %
Lymphs Abs: 1 10*3/uL (ref 0.7–4.0)
MCH: 32 pg (ref 26.0–34.0)
MCHC: 34.5 g/dL (ref 30.0–36.0)
MCV: 92.7 fL (ref 80.0–100.0)
Monocytes Absolute: 1.6 10*3/uL — ABNORMAL HIGH (ref 0.1–1.0)
Monocytes Relative: 8 %
Neutro Abs: 18 10*3/uL — ABNORMAL HIGH (ref 1.7–7.7)
Neutrophils Relative %: 84 %
Platelets: 132 10*3/uL — ABNORMAL LOW (ref 150–400)
RBC: 2.19 MIL/uL — ABNORMAL LOW (ref 4.22–5.81)
RDW: 19.7 % — ABNORMAL HIGH (ref 11.5–15.5)
WBC: 21.4 10*3/uL — ABNORMAL HIGH (ref 4.0–10.5)
nRBC: 0 % (ref 0.0–0.2)

## 2022-06-27 LAB — MAGNESIUM: Magnesium: 2.5 mg/dL — ABNORMAL HIGH (ref 1.7–2.4)

## 2022-06-27 MED ORDER — INSULIN GLARGINE-YFGN 100 UNIT/ML ~~LOC~~ SOLN
5.0000 [IU] | Freq: Two times a day (BID) | SUBCUTANEOUS | Status: DC
  Administered 2022-06-27 – 2022-06-29 (×5): 5 [IU] via SUBCUTANEOUS
  Filled 2022-06-27 (×6): qty 0.05

## 2022-06-27 MED ORDER — TRACE MINERALS CU-MN-SE-ZN 300-55-60-3000 MCG/ML IV SOLN
INTRAVENOUS | Status: AC
  Filled 2022-06-27: qty 806.4

## 2022-06-27 NOTE — Assessment & Plan Note (Signed)
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 -Check PSA

## 2022-06-27 NOTE — Assessment & Plan Note (Signed)
Patient was started on dialysis for AKI and anuria on 6/16. Received CRRT from 6/22 - 6/29. Permanent HD catheter was placed by vascular surgery on 6/30 and he was started on intermittent dialysis. Significant increase in BUN and creatinine today.  Patient remained an uric.  Last dialysis was on Friday. Nephrology is on board-will be getting dialysis tomorrow morning

## 2022-06-27 NOTE — Progress Notes (Signed)
Patient confused to place, time and situation. Patient continues to pull of oxygen and heart monitor cords. Patient tolerated 1 bottle of the contrast for CT scan. CT scan completed. Patient tolerated boost breeze and oral medication. Patient had increased RR and agitation, PRN Seroquel and oxycodone administered and noted to be effective to help calm patient. TPN maintained.

## 2022-06-27 NOTE — Assessment & Plan Note (Signed)
Last hemoglobin 7.0.  No obvious bleeding  Patient received total of 4 units of packed red blood cells during the hospital course.  Had GI bleed on 06/16/2022.  Having some blood loss with the continuous dialysis if the filter gets clotted and unable to return blood. Anemia panel consistent with anemia of chronic disease with some iron deficiency. -Will start him on iron supplement once continue to tolerate p.o. intake.

## 2022-06-27 NOTE — Assessment & Plan Note (Signed)
Present on admission secondary to severe necrotizing pancreatitis.  Empirically on meropenem and Diflucan.  Repeat blood cultures negative.

## 2022-06-27 NOTE — Progress Notes (Signed)
PHARMACY - TOTAL PARENTERAL NUTRITION CONSULT NOTE   Indication: without adequate nutrition for > 7 days / ileus  Patient Measurements: Height: 6' (182.9 cm) Weight: 102 kg (224 lb 13.9 oz) IBW/kg (Calculated) : 77.6 TPN AdjBW (KG): 83.7 Body mass index is 30.5 kg/m.  Assessment: 76 y/o male with h/o DM, BPH, prostate cancer, hiatal hernia, kidney stones and HTN who is admitted with severe necrotizing pancreatitis, portal vein thrombosis, liver lesions, AKI and sepsis.  Glucose / Insulin: BG 172 - 215 (22 units SSI required) Electrolytes: Hypophosphatemia secondary to CRRT Renal: CRRT stopped 6/28 PM and transitioned to iHD (first HD 6/29) Hepatic: Transaminitis resolved. LFTs within normal limits Intake / Output; MIVF: + 2L for the admission GI Imaging: 06/21/22 Abd Xray shows diffuse gaseous distention of bowel compatible with ileus GI Surgeries / Procedures: none recent  Central access: 06/17/22  TPN start date: 06/17/22   Nutritional Goals: Goal TPN rate is 70 mL/hr (provides 121 g of protein and 2241 kcals per day)  RD Assessment: Estimated Needs Total Energy Estimated Needs: 2200-2500kcal/day Total Protein Estimated Needs: 120-135g/day Total Fluid Estimated Needs: 2.1-2.4Lday  Current Nutrition: Boost Breeze po TID --no current feeding access  Plan:  continue TPN at 70 mL/hr --Changes made to decrease total volume provided via TPN given anuric requiring HD to avoid fluid overload Nutritional components: Amino acids (using 15% Clinisol): 121 g Dextrose: 319 g Lipids (using 20% SMOFlipids): 67 g kCal 2241 Volume: 1680 mL (1780 mL with overfill) Electrolytes in TPN: Na 47mq/L, K 243m/L, Ca 35m32mL, Mg 1mE66m, and Phos 1 mmol/L. Cl:Ac 1:1 Add standard MVI and trace elements to TPN continue moderate q4h SSI and adjust as needed  Continue 20u insulin in TPN per diabetes coordinator recommendations Monitor TPN labs on Mon/Thurs, daily until stable  [name]/2023,6:34 AM

## 2022-06-27 NOTE — Progress Notes (Signed)
Progress Note   Patient: Xavier Esparza IRC:[id number] DOB: 1946/05/04 DOA: 06/08/2022     19 DOS: the patient was seen and examined on 06/27/2022   Brief hospital course: 76 y.o. male with medical history significant for DM, BPH, history of prostate cancer, kidney stones status post lithotripsy, negative colonoscopy 2022 (3 benign polyps), who presents to the ED by EMS following a syncopal episode at home while walking to the bathroom.  Wife said she heard a loud thud and went to find him on the floor.  She said he was awake when she got there but he could not help himself up and she called 911.  Patient states he was in his usual state of health but about 2 weeks prior he saw a little blood in his urine similar to when he had his kidney stone.  He saw his urologist yesterday who gave him a prescription for Levaquin.  He otherwise denied feeling unwell previous to the episode.  He denied chest pain, shortness of breath, headache or visual disturbance, one-sided numbness weakness or tingling, nausea or vomiting, abdominal pain, urinary frequency or burning or diarrhea.[facility] reported BP 76/45 and he was treated with IM epinephrine and a 400 mL fluid bolus.  He was awake and alert x4 by arrival.  After his arrival in the emergency room he developed abdominal pain and vomiting.   Initial CT abdomen pelvis with moderate acute pancreatitis no evidence of pancreatic necrosis or pseudocyst.  Follow-up MRI abdomen concerning for possible choledocholithiasis with evidence of necrotizing pancreatitis.  Case discussed with GI Dr. Marius Ditch at [facility].   There was mention of possible portal vein thrombosis.  Heparin GTT was initially started however patient developed suspected acute hematuria so heparin GTT was stopped.  VQ scan ordered and perfusion component will be completed prior to patient transfer.  6/15: Clinical status overall unchanged.  Patient remains with abdominal pain associated with nausea.  Hemodynamically  stable.  Mild elevation in white count.  Lipase and LFTs have improved over interval.  Discussed case with GI and general surgery.  Likely that patient's presentation is secondary to necrotizing pancreatitis.  Currently as there is no CBD stone and there is no strong indication for transfer for ERCP.  From a surgical standpoint no intervention is recommended.  There is nothing from an interventional radiology standpoint to drain.  As such we will discontinue transfer and keep patient in [facility].   6/16: No appreciable change in clinical status.  LFTs are downtrending.  White count slightly downtrending.  No fevers noted over interval.  Increasing abdominal distention noted.  Ascites and peripancreatic fluid seen on repeat CAT scan today.  Discussed case with PCCM, nephrology, GI.  Will attempt postpyloric feedings.  Kidney function continues to deteriorate.  Nephrology will initiate hemodialysis today.   6/17: No change in clinical status.  White count remains minimally elevated.  No fevers over interval.  Remains with abdominal distention.  Started HD yesterday.  We will run another round of HD today.   6/18: No appreciable clinical changes.  White blood cell count slightly downtrending.  Creatinine worsened despite 2 rounds of hemodialysis.  Remains with hematuria despite stopping heparin 2 days ago.   6/19: Increasing abd distention.  KUB consistent with ileus.  [place] negative for ascites   6/20: Postpyloric Dobbhoff discontinued.  NGT placed.  Tip in stomach. Set to low intermittent suction.  Rectal tube in place.  Abdominal distention improved.  White blood cell count increased to 22  For the week of 6/21 through 06/22/2022:  the patient had an upper GI bleed and was transfused 3 units of packed red blood cells.  He still remains anemic.  EGD showing gastric ulcers.  Patient was initially on Protonix drip now on Protonix twice a day.  Last hemoglobin 7.3.  CRRT was started on 06/17/2022 and continuing  as per nephrology.  The patient is still anuric.  The patient severe necrotizing pancreatitis.  Repeat CT scan showed increased nodularity in the omentum.  No discrete abscess about the pancreas.  Did see possible liver abscess.  Antibiotics were changed over to meropenem and Diflucan.  Repeat blood cultures on 06/18/2022 are negative.  The patient did have an ileus on x-ray so tube feeds were stopped.  Patient is on TPN and has a PICC line.  The patient's mental status has waxed and waned during the week and CT scan of the head was negative.  6/28: Patient continued to experience abdominal pain.  Nephrology is planning to continue CRRT for now.  Continue to have NG tube but secretions are decreasing.  Remained on TPN.  6/29: Clinically seems stable.  Continue to require pain medications.  Nephrology is going to start him on intermittent dialysis and stopping CRRT.  Also trying trickle feeds through NG tube as NG tube secretions remained minimal. Hypophosphatemia which is being repleted. Anemia panel consistent with anemia of chronic disease with some iron deficiency. Ferritin levels elevated. We will start him on iron supplement once tube feed established.  6/30: Patient with very slow improvement.  Able to tolerate trickle food, rate was increased today. Started on intermittent dialysis.  Going for permanent HD catheter today.  7/1: Patient continued to have low-grade fever at 100.  Had his HD catheter placed by vascular surgery yesterday.  Hemoglobin dropped to 6.2 this morning, getting 1 unit of PRBC. Ordered repeat CT abdomen due to persistent low-grade fever and leukocytosis. NG tube was pulled out by patient, will try p.o. intake before replacing.  7/2: Patient becomes more confused and agitated overnight.  Significant elevation of BUN to 103 and creatinine of 7.21, last dialysis was on Friday.  Tolerating some p.o. intake, remained on TPN. CT abdomen was completed last night and shows as  below. 1. Findings consistent with marked severity acute necrotizing pancreatitis, with interval increase in peripancreatic fluid since the prior study. 2. Interval development of marked severity enteritis involving multiple loops of distal small bowel. 3. Extensive omental nodularity and thickening throughout the abdomen and pelvis, concerning for the presence of metastatic disease. 4. 6 mm nonobstructing right renal calculus. 5. Colonic diverticulosis. 6. Moderate to marked severity bibasilar consolidation with a small right pleural effusion. 7. Multiple cystic appearing areas scattered throughout the liver parenchyma, consistent with hepatic cysts. 8. Stable heterogeneous density within the right hepatic lobe which corresponds to a prior region of portal vein thrombosis and suspected micro abscesses. 9. Small hiatal hernia. 10. Interval removal of the nasogastric tube and Foley catheter seen on the prior study. 11. Aortic atherosclerosis. Patient has an history of prostate cancer, checking PSA for concern of omental nodularity.   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  With low-grade fever on 6/23 and I repeated blood cultures which are negative for 3 days.  Repeat CT scan does not show any abscess on the pancreas but does show fluid collection still in the liver which could be an abscess.  Empirically on  meropenem and Diflucan.  Tumor markers ordered including CEA normal range at 0.8 and normal alpha-fetoprotein at less than 1.8.  CA 19-9 within normal range currently on TPN.   Patient is critically ill.  Appreciate palliative care consultation. After accidental removal of NG tube patient is tolerating some p.o. intake. Repeat CT abdomen with some increase in peripancreatic fluid, there was some new omental nodularity concerning for metastatic disease noted.  Tumor markers were negative  but he also has an history of prostate cancer. -Check PSA -Continue antibiotics -Continue with p.o. intake as tolerated -Continue with TPN  Acute blood loss anemia Last hemoglobin 7.0.  No obvious bleeding  Patient received total of 4 units of packed red blood cells during the hospital course.  Had GI bleed on 06/16/2022.  Having some blood loss with the continuous dialysis if the filter gets clotted and unable to return blood. Anemia panel consistent with anemia of chronic disease with some iron deficiency. -Will start him on iron supplement once continue to tolerate p.o. intake.   Upper GI bleed Secondary to duodenal ulcers.  No active bleeding on EGD.  Monitor closely with heparin with dialysis.  On Protonix IV twice daily  Severe sepsis ([place]) Present on admission secondary to severe necrotizing pancreatitis.  Empirically on meropenem and Diflucan.  Repeat blood cultures negative.  Acute metabolic encephalopathy Mental status waxes and wanes..  Will give as needed low-dose Seroquel at night. Need to continue pain medications.  AKI (acute kidney injury) [facility]) Patient was started on dialysis for AKI and anuria on 6/16. Received CRRT from 6/22 - 6/29. Permanent HD catheter was placed by vascular surgery on 6/30 and he was started on intermittent dialysis. Significant increase in BUN and creatinine today.  Patient remained an uric.  Last dialysis was on Friday. Nephrology is on board-will be getting dialysis tomorrow morning  Portal vein thrombosis Unable to do anticoagulation with drop in hemoglobin and upper GI bleed.  Syncope Secondary to hypotension   Nephrolithiasis Nonobstructing right renal stone.  No acute issues suspected  History of prostate cancer Repeat CT abdomen with concern of omental metastatic disease. -Check PSA  BPH (benign prostatic hyperplasia) No acute issues  Diabetes mellitus without complication (HCC) Sugars elevated while on TPN.  -Add Semglee 5  units twice daily -Continue with SSI     Subjective: Patient was little more somnolent when seen today.  Denies any pain, nausea or vomiting.  Able to tolerate clear liquid and some boost.  Wife at bedside  Physical Exam: Vitals:   06/27/22 1300 06/27/22 1400 06/27/22 1500 06/27/22 1546  BP: 125/64 126/62 137/72   Pulse: 72 73    Resp: (!) 26 18 (!) 21   Temp:    (!) 97 F (36.1 C)  TempSrc:    Axillary  SpO2: 97% 98%    Weight:      Height:       General.  Ill-appearing gentleman, in no acute distress. Pulmonary.  Lungs clear bilaterally, normal respiratory effort. CV.  Regular rate and rhythm, no JVD, rub or murmur. Abdomen.  Soft, nontender, nondistended, BS positive. CNS.  Somnolent.  No focal neurologic deficit. Extremities.  No edema, no cyanosis, pulses intact and symmetrical. Psychiatry.  Judgment and insight appears normal.  Data Reviewed: Prior data reviewed  Family Communication: Discussed with wife at bedside  Disposition: Status is: Inpatient Remains inpatient appropriate because: Severity of illness   Planned Discharge Destination: To be determined   Time spent: 52 minutes  This record has been created using Systems analyst. Errors have been sought and corrected,but may not always be located. Such creation errors do not reflect on the standard of care.  Author: Lorella Nimrod, MD 06/27/2022 4:13 PM  For on call review [email or web address].

## 2022-06-27 NOTE — Assessment & Plan Note (Signed)
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  With low-grade fever on 6/23 and I repeated blood cultures which are negative for 3 days.  Repeat CT scan does not show any abscess on the pancreas but does show fluid collection still in the liver which could be an abscess.  Empirically on meropenem and Diflucan.  Tumor markers ordered including CEA normal range at 0.8 and normal alpha-fetoprotein at less than 1.8.  CA 19-9 within normal range currently on TPN.   Patient is critically ill.  Appreciate palliative care consultation. After accidental removal of NG tube patient is tolerating some p.o. intake. Repeat CT abdomen with some increase in peripancreatic fluid, there was some new omental nodularity concerning for metastatic disease noted.  Tumor markers were negative but he also has an history of prostate cancer. -Check PSA -Continue antibiotics -Continue with p.o. intake as tolerated -Continue with TPN

## 2022-06-27 NOTE — Progress Notes (Signed)
[facility]  ROUNDING NOTE   Subjective:  BUN and creatinine have been rising over the weekend. Patient has no urine output. Creatinine up to 7. We are planning for hemodialysis treatment again tomorrow.   Objective:  Vital signs in last 24 hours:  Temp:  [97 F (36.1 C)-99.7 F (37.6 C)] 97 F (36.1 C) (07/02 0749) Pulse Rate:  [30-95] 79 (07/02 0600) Resp:  [20-32] 22 (07/02 0749) BP: (102-125)/(58-68) 106/63 (07/02 0600) SpO2:  [90 %-98 %] 95 % (07/02 0600) Weight:  [102 kg] 102 kg (07/02 0500)  Weight change: 2.4 kg Filed Weights   06/25/22 1412 06/26/22 0456 06/27/22 0500  Weight: 99.4 kg 97.9 kg 102 kg    Intake/Output: I/O last 3 completed shifts: In: 5211.9 [I.V.:2477.3; Blood:520; Other:30; NG/GT:1269.2; IV Piggyback:915.5] Out: 20 [Stool:20]   Intake/Output this shift:  Total I/O In: 30 [Other:30] Out: -   Physical Exam: General: No acute distress  Head: Normocephalic, atraumatic. Dry oral mucosal membranes  Eyes: Anicteric  Lungs:  Diminished in bases, normal effort, [place] O2  Heart: Regular rate and rhythm  Abdomen:  Firm, distended, mild tenderness  Extremities: Trace peripheral edema.  Neurologic: Awake, alert, confused  Skin: Warm, dry  Access: Femoral  dialysis catheter 06/19/2022.  Right IJ PermCath 2/35/5732    Basic Metabolic Panel: Recent Labs  Lab 06/23/22 0408 06/23/22 1606 06/24/22 0613 06/24/22 1353 06/24/22 1949 06/25/22 0531 06/26/22 0501 06/27/22 0533 06/27/22 0722  NA 139   < > 138  --  136 137 135  --  135  K 4.0   < > 4.2  --  3.8 3.9 4.0  --  4.2  CL 109   < > 107  --  99 103 101  --  100  CO2 24   < > 27  --  '25 24 25  '$ --  22  GLUCOSE 192*   < > 189*  --  160* 184* 201*  --  193*  BUN 53*   < > 51*  --  48* 71* 72*  --  103*  CREATININE 2.99*   < > 2.62*  --  3.20* 4.00* 4.65*  --  7.21*  CALCIUM 8.2*   < > 8.2*  --  8.1* 8.5* 8.3*  --  8.9  MG 2.4  --  2.4  --   --  2.0 2.1 2.5*  --   PHOS 2.0*   < >  1.4* 2.7 4.1  4.1 4.1 4.4  --  5.9*   < > = values in this interval not displayed.     Liver Function Tests: Recent Labs  Lab 06/21/22 0414 06/21/22 1555 06/24/22 0613 06/24/22 1949 06/25/22 0531 06/26/22 0501 06/27/22 0722  AST 45*  --  38  --   --   --   --   ALT 29  --  18  --   --   --   --   ALKPHOS 112  --  98  --   --   --   --   BILITOT 0.4  --  0.4  --   --   --   --   PROT 5.7*  --  5.9*  --   --   --   --   ALBUMIN 2.0*  2.1*   < > 2.3* 2.3* 2.4* 2.6* 2.7*   < > = values in this interval not displayed.    No results for input(s): "LIPASE", "AMYLASE" in the last 168  hours. No results for input(s): "AMMONIA" in the last 168 hours.    CBC: Recent Labs  Lab 06/23/22 0408 06/23/22 1750 06/24/22 0613 06/25/22 0531 06/26/22 0501 06/26/22 1616 06/27/22 0533  WBC 23.9*  --  25.8* 25.7* 23.1*  --  21.4*  NEUTROABS 19.5*  --  21.4* 21.2* 18.6*  --  18.0*  HGB 8.2*   < > 7.6* 7.1* 6.2* 7.4* 7.0*  HCT 24.5*   < > 23.4* 21.3* 18.7* 22.2* 20.3*  MCV 85.7  --  85.4 84.9 84.2  --  92.7  PLT 199  --  175 179 126*  --  132*   < > = values in this interval not displayed.     Cardiac Enzymes: No results for input(s): "CKTOTAL", "CKMB", "CKMBINDEX", "TROPONINI" in the last 168 hours.   BNP: Invalid input(s): "POCBNP"  CBG: Recent Labs  Lab 06/26/22 1944 06/26/22 2334 06/27/22 0329 06/27/22 0804 06/27/22 1114  GLUCAP 180* 199* 187* 203* 188*     Microbiology: Results for orders placed or performed during the hospital encounter of 06/08/22  Blood culture (routine x 2)     Status: None   Collection Time: 06/08/22  1:33 PM   Specimen: BLOOD  Result Value Ref Range Status   Specimen Description BLOOD LEFT HAND  Final   Special Requests   Final    BOTTLES DRAWN AEROBIC AND ANAEROBIC Blood Culture results may not be optimal due to an inadequate volume of blood received in culture bottles   Culture   Final    NO GROWTH 5 DAYS Performed at [facility], [address] [place], [place] [address]    Report Status 06/13/2022 FINAL  Final  Blood culture (routine x 2)     Status: None   Collection Time: 06/08/22  2:15 PM   Specimen: BLOOD  Result Value Ref Range Status   Specimen Description BLOOD BLOOD LEFT WRIST  Final   Special Requests   Final    BOTTLES DRAWN AEROBIC AND ANAEROBIC Blood Culture results may not be optimal due to an inadequate volume of blood received in culture bottles   Culture   Final    NO GROWTH 5 DAYS Performed at [facility], [address] [place],  [address]    Report Status 06/13/2022 FINAL  Final  MRSA Next Gen by PCR, Nasal     Status: None   Collection Time: 06/10/22  4:14 PM   Specimen: Nasal Mucosa; Nasal Swab  Result Value Ref Range Status   MRSA by PCR Next Gen NOT DETECTED NOT DETECTED Final    Comment: (NOTE) The GeneXpert MRSA Assay (FDA approved for NASAL specimens only), is one component of a comprehensive MRSA colonization surveillance program. It is not intended to diagnose MRSA infection nor to guide or monitor treatment for MRSA infections. Test performance is not FDA approved in patients less than 77 years old. Performed at [facility], [address] [place],  [address]   SARS Coronavirus 2 by RT PCR (hospital order, performed in [facility] hospital lab) *cepheid single result test* Anterior Nasal Swab     Status: None   Collection Time: 06/10/22  5:17 PM   Specimen: Anterior Nasal Swab  Result Value Ref Range Status   SARS Coronavirus 2 by RT PCR NEGATIVE NEGATIVE Final    Comment: (NOTE) SARS-CoV-2 target nucleic acids are NOT DETECTED.  The SARS-CoV-2 RNA is generally detectable in upper and lower respiratory specimens during the acute  phase of infection. The lowest concentration of SARS-CoV-2 viral copies this assay can detect is 250 copies / mL. A negative result does not preclude SARS-CoV-2 infection and should not be used  as the sole basis for treatment or other patient management decisions.  A negative result may occur with improper specimen collection / handling, submission of specimen other than nasopharyngeal swab, presence of viral mutation(s) within the areas targeted by this assay, and inadequate number of viral copies (<250 copies / mL). A negative result must be combined with clinical observations, patient history, and epidemiological information.  Fact Sheet for Patients:   https://www.patel.info/  Fact Sheet for Healthcare Providers: https://hall.com/  This test is not yet approved or  cleared by the [place] FDA and has been authorized for detection and/or diagnosis of SARS-CoV-2 by FDA under an Emergency Use Authorization (EUA).  This EUA will remain in effect (meaning this test can be used) for the duration of the COVID-19 declaration under Section 564(b)(1) of the Act, 21 U.S.C. section 360bbb-3(b)(1), unless the authorization is terminated or revoked sooner.  Performed at [facility], [address] [place], [place] [address]   Culture, blood (Routine X 2) w Reflex to ID Panel     Status: None   Collection Time: 06/18/22 10:03 AM   Specimen: BLOOD  Result Value Ref Range Status   Specimen Description BLOOD RIGHT HAND  Final   Special Requests   Final    BOTTLES DRAWN AEROBIC ONLY Blood Culture adequate volume   Culture   Final    NO GROWTH 5 DAYS Performed at [facility], [address], [place], [place] [address]    Report Status 06/23/2022 FINAL  Final  Culture, blood (Routine X 2) w Reflex to ID Panel     Status: None   Collection Time: 06/18/22 10:20 AM   Specimen: BLOOD  Result Value Ref Range Status   Specimen Description BLOOD LEFT [facility]  Final   Special Requests   Final    BOTTLES DRAWN AEROBIC AND ANAEROBIC Blood Culture adequate volume   Culture   Final    NO GROWTH 5 DAYS Performed at [facility], [address] [place], [place] [address]    Report Status 06/23/2022 FINAL  Final    Coagulation Studies: No results for input(s): "LABPROT", "INR" in the last 72 hours.   Urinalysis: No results for input(s): "COLORURINE", "LABSPEC", "PHURINE", "GLUCOSEU", "HGBUR", "BILIRUBINUR", "KETONESUR", "PROTEINUR", "UROBILINOGEN", "NITRITE", "LEUKOCYTESUR" in the last 72 hours.  Invalid input(s): "APPERANCEUR"     Imaging: CT ABDOMEN PELVIS WO CONTRAST  Result Date: 06/26/2022 CLINICAL DATA:  Persistent low-grade fever and leukocytosis sent to rule out abscess. EXAM: CT ABDOMEN AND PELVIS WITHOUT CONTRAST TECHNIQUE: Multidetector CT imaging of the abdomen and pelvis was performed following the standard protocol without IV contrast. RADIATION DOSE REDUCTION: This exam was performed according to the departmental dose-optimization program which includes automated exposure control, adjustment of the mA and/or kV according to patient size and/or use of iterative reconstruction technique. COMPARISON:  June 18, 2022 FINDINGS: Lower chest: Moderate to marked severity areas of consolidation are seen within the posteromedial aspects of the bilateral lung bases. Small bilateral pleural effusions are noted. Hepatobiliary: Numerous cystic appearing areas are seen scattered throughout the liver parenchyma. Heterogeneous appearance of a portion of the right lobe of the liver is again seen. This is stable in appearance and corresponds to an area of previously described portal vein thrombosis and suspected micro abscesses. No gallstones,  gallbladder wall thickening, or biliary dilatation. Pancreas: There is marked severity peripancreatic inflammatory fat stranding and peripancreatic fluid. While the peripancreatic inflammatory fat stranding is predominant stable in severity, the amount of peripancreatic fluid is increased. This is most prominent within the regions in between the body of the pancreas and  posterior wall of the body of the stomach. The low-attenuation lesion noted within the mid body of the pancreas on the prior study is not clearly identified. This is limited in evaluation in the absence of intravenous contrast. Spleen: Normal in size without focal abnormality. Adrenals/Urinary Tract: Adrenal glands are unremarkable. Kidneys are normal in size, without obstructing renal calculi or hydronephrosis. A 3.6 cm diameter cyst is seen within the posterior aspect of the mid to lower left kidney. A 6 mm nonobstructing renal calculus is seen within the lower pole of the right kidney. A small amount of air is seen within the lumen of the urinary bladder. The Foley catheter seen on the prior study has been removed. Stomach/Bowel: There is a small hiatal hernia. The nasogastric tube seen on the prior study has been removed. Appendix appears normal. No evidence of bowel dilatation. Markedly thickened and inflamed loops of small bowel are seen within the right lower quadrant and right hemipelvis (axial CT images 58 through 80, CT series 3). This represents a new finding when compared to the prior study. Noninflamed diverticula are seen throughout the large bowel. Vascular/Lymphatic: Aortic atherosclerosis. A venous catheter is seen entering via the right groin. No enlarged abdominal or pelvic lymph nodes. Reproductive: Multiple prostate radiation implantation seeds are seen. Other: No abdominopelvic ascites. Extensive omental nodularity and thickening is again seen throughout the abdomen Musculoskeletal: Moderate to marked severity anasarca is seen along the lateral aspects of the lower abdominal and pelvic walls, as well as the lateral aspects of both hips. Multilevel degenerative changes are seen throughout the lumbar spine. IMPRESSION: 1. Findings consistent with marked severity acute necrotizing pancreatitis, with interval increase in peripancreatic fluid since the prior study. 2. Interval development of marked  severity enteritis involving multiple loops of distal small bowel. 3. Extensive omental nodularity and thickening throughout the abdomen and pelvis, concerning for the presence of metastatic disease. 4. 6 mm nonobstructing right renal calculus. 5. Colonic diverticulosis. 6. Moderate to marked severity bibasilar consolidation with a small right pleural effusion. 7. Multiple cystic appearing areas scattered throughout the liver parenchyma, consistent with hepatic cysts. 8. Stable heterogeneous density within the right hepatic lobe which corresponds to a prior region of portal vein thrombosis and suspected micro abscesses. 9. Small hiatal hernia. 10. Interval removal of the nasogastric tube and Foley catheter seen on the prior study. 11. Aortic atherosclerosis. Aortic Atherosclerosis (ICD10-I70.0). Electronically Signed   By: Virgina Norfolk M.D.   On: 06/26/2022 23:43   PERIPHERAL VASCULAR CATHETERIZATION  Result Date: 06/25/2022 See surgical note for result.    Medications:    sodium chloride Stopped (06/25/22 1829)   albumin human 25 g (06/27/22 1016)   meropenem (MERREM) IV Stopped (06/26/22 1735)   TPN ADULT (ION) 70 mL/hr at 06/27/22 0600   TPN ADULT (ION)      Chlorhexidine Gluconate Cloth  6 each Topical Q0600   feeding supplement  1 Container Oral TID BM   heparin injection (subcutaneous)  5,000 Units Subcutaneous Q8H   insulin aspart  0-15 Units Subcutaneous Q4H   insulin glargine-yfgn  5 Units Subcutaneous BID   multivitamin  1 tablet Per Tube QHS   mouth rinse  15  mL Mouth Rinse 4 times per day   pantoprazole  40 mg Intravenous Q12H   sodium chloride flush  10-40 mL Intracatheter Q12H   sodium chloride, acetaminophen **OR** acetaminophen, alteplase, guaiFENesin-dextromethorphan, haloperidol lactate, heparin, HYDROmorphone (DILAUDID) injection, HYDROmorphone (DILAUDID) injection, hydrOXYzine, iohexol, ipratropium-albuterol, lidocaine (PF), lidocaine-prilocaine, lip balm,  ondansetron (ZOFRAN) IV, mouth rinse, mouth rinse, oxyCODONE, pentafluoroprop-tetrafluoroeth, QUEtiapine, sodium chloride flush  Assessment/ Plan:  Xavier Esparza is a 76 y.o.  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  Normal kidney function prior to admission.  IV contrast exposure on 06/08/2022 and 06/09/2022.  Renal ultrasound negative for obstruction.  Urinalysis shows hematuria and mild proteinuria.   ?  Gross hematuria related to Foley catheter.  Patient has history of prostate cancer. Received first dialysis treatment on June 16.   Patient required CRRT starting June 22 and stopped on 06/24/22  Update: Creatinine trending up off of dialysis over the weekend.  We will plan for hemodialysis treatment again tomorrow.  No urine output noted.   Lab Results  Component Value Date   CREATININE 7.21 (H) 06/27/2022   CREATININE 4.65 (H) 06/26/2022   CREATININE 4.00 (H) 06/25/2022    Intake/Output Summary (Last 24 hours) at 06/27/2022 1159 Last data filed at 06/27/2022 0749 Gross per 24 hour  Intake 2601.81 ml  Output 20 ml  Net 2581.81 ml    Acute pancreatitis, severe necrotizing.  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   Patient has been on meropenem and antifungal treatment.  Remains on TPN.   Diabetes mellitus type 2 without complication.Home regimen includes metformin.  Hemoglobin A1c 6.2 on 01/08/2022.  -Management of blood glucose per  primary team.  Acute ileus, receiving IV narcotics for severe acute pancreatitis. Tube feeds stopped and NGT placed to LWIS. Rectal tube placed  EGD showed 3 gastric ulcers, no active bleeding noted.  Patient remains on TPN.     LOS: 19 Xavier Esparza 7/2/202311:59 AM

## 2022-06-27 NOTE — Assessment & Plan Note (Signed)
No acute issues.

## 2022-06-27 NOTE — Assessment & Plan Note (Signed)
Sugars elevated while on TPN.  -Add Semglee 5 units twice daily -Continue with SSI

## 2022-06-27 NOTE — Evaluation (Signed)
Clinical/Bedside Swallow Evaluation Patient Details  Name: Xavier Esparza MRN: [id number] Date of Birth: [birth year]  Today's Date: 06/27/2022 Time: SLP Start Time (ACUTE ONLY): 0850 SLP Stop Time (ACUTE ONLY): 0910 SLP Time Calculation (min) (ACUTE ONLY): 20 min  Past Medical History:  Past Medical History:  Diagnosis Date   Adopted    Cataract    resolved with surgery   Diabetes mellitus    Dyslipidemia    Erectile dysfunction    Herpes labialis    Upper Lip   Hyperlipidemia    Male hypogonadism    Prostate cancer ([place])    Renal stones    Thyroid nodule    Right   Past Surgical History:  Past Surgical History:  Procedure Laterality Date   CATARACT EXTRACTION, BILATERAL  2017   Dr. Katy Fitch   COLONOSCOPY WITH PROPOFOL N/A 03/09/2018   Procedure: COLONOSCOPY WITH PROPOFOL;  Surgeon: Lin Landsman, MD;  Location: [place];  Service: Gastroenterology;  Laterality: N/A;   COLONOSCOPY WITH PROPOFOL N/A 03/12/2021   Procedure: COLONOSCOPY WITH PROPOFOL;  Surgeon: Lin Landsman, MD;  Location: [place];  Service: Endoscopy;  Laterality: N/A;  priority 4   CYSTOSCOPY W/ URETERAL STENT PLACEMENT  08/09/2017   Procedure: CYSTOSCOPY WITH RETROGRADE PYELOGRAM/URETERAL STENT PLACEMENT;  Surgeon: Royston Cowper, MD;  Location: [facility];  Service: Urology;;   ESOPHAGOGASTRODUODENOSCOPY (EGD) WITH PROPOFOL N/A 06/18/2022   Procedure: ESOPHAGOGASTRODUODENOSCOPY (EGD) WITH PROPOFOL;  Surgeon: Jonathon Bellows, MD;  Location: [facility];  Service: Gastroenterology;  Laterality: N/A;   EXTRACORPOREAL SHOCK WAVE LITHOTRIPSY Left 09/15/2017   Procedure: EXTRACORPOREAL SHOCK WAVE LITHOTRIPSY (ESWL);  Surgeon: Royston Cowper, MD;  Location: [facility];  Service: Urology;  Laterality: Left;   EYE SURGERY Bilateral 2016   cataract extractions   GREEN LIGHT LASER TURP (TRANSURETHRAL RESECTION OF PROSTATE N/A 04/20/2016   Procedure: GREEN LIGHT LASER TURP (TRANSURETHRAL RESECTION OF  PROSTATE;  Surgeon: Royston Cowper, MD;  Location: [facility];  Service: Urology;  Laterality: N/A;   INGUINAL HERNIA REPAIR  01/11/2012   Procedure: HERNIA REPAIR INGUINAL ADULT;  Surgeon: Pedro Earls, MD;  Location: [place];  Service: General;  Laterality: Right;  Open right inguinal hernia repair with mesh   POLYPECTOMY  03/12/2021   Procedure: POLYPECTOMY;  Surgeon: Lin Landsman, MD;  Location: [place];  Service: Endoscopy;;   RADIOACTIVE SEED IMPLANT     PROSTATE   URETEROSCOPY WITH HOLMIUM LASER LITHOTRIPSY Right 08/09/2017   Procedure: URETEROSCOPY WITH HOLMIUM LASER LITHOTRIPSY;  Surgeon: Royston Cowper, MD;  Location: [facility];  Service: Urology;  Laterality: Right;   HPI:  Per H&P "Xavier Esparza is a 76 y.o. male with medical history significant for DM, BPH, history of prostate cancer, kidney stones status post lithotripsy, negative colonoscopy 2022 (3 benign polyps), who presents to the ED by EMS following a syncopal episode at home while walking to the bathroom.  Wife said she heard a loud thud and went to find him on the floor.  She said he was awake when she got there but he could not help himself up and she called 911.  Patient states he was in his usual state of health but about 2 weeks prior he saw a little blood in his urine similar to when he had his kidney stone.  He saw his urologist yesterday who gave him a prescription for Levaquin.  He otherwise denied feeling unwell previous to the episode.  He denied  chest pain, shortness of breath, headache or visual disturbance, one-sided numbness weakness or tingling, nausea or vomiting, abdominal pain, urinary frequency or burning or diarrhea.[facility] KitchenEMS reported BP 76/45 and he was treated with IM epinephrine and a 400 mL fluid bolus.  He was awake and alert x4 by arrival.  After his arrival in the emergency room he developed abdominal pain and vomiting."   CT Abdomen Pelvis, 06/26/22, "1. Findings consistent  with marked severity acute necrotizing pancreatitis, with interval increase in peripancreatic fluid since the prior study. 2. Interval development of marked severity enteritis involving multiple loops of distal small bowel. 3. Extensive omental nodularity and thickening throughout the abdomen and pelvis, concerning for the presence of metastatic disease. 4. 6 mm nonobstructing right renal calculus. 5. Colonic diverticulosis. 6. Moderate to marked severity bibasilar consolidation with a small right pleural effusion. 7. Multiple cystic appearing areas scattered throughout the liver parenchyma, consistent with hepatic cysts. 8. Stable heterogeneous density within the right hepatic lobe which corresponds to a prior region of portal vein thrombosis and suspected micro abscesses. 9. Small hiatal hernia. 10. Interval removal of the nasogastric tube and Foley catheter seen on the prior study. 11. Aortic atherosclerosis."  CXR, 06/24/22, "Residual densities at the medial right lung base probably represent atelectasis. Otherwise, no acute chest findings."     Assessment / Plan / Recommendation  Clinical Impression  Pt seen for clinical swallowing evaluation. Pt lethargic, but able to rouse for PO intake. Confusion noted. Cleared with RN who stated pt taking pills with water without difficulty. RN also denied pt with any current diet restrictions and could be given a variety of trials including pureed textures and solids.   Pt unable to fully participate in oral motor examination due to inconsistent ability to follow commands in setting of AMS. Xerostomia appreciated. Pt wears upper dentures and few chipped teeth.  Pt given trials of thin liquids (via cup and straw), pureed, and solid. Despite encouragement and education, pt would not accept solid trials. Oral phase was functional with trials of thin liquids and pureed. Pharyngeal swallow appeared [facility] per clinical assessment. To palpation, pt  with seemingly timely swallow initiation and seemingly adequate laryngeal elevation. No change to vocal quality across trials. No change to respiratory status across trials. Pt did not attempt to feed self despite verbal and tactile cueing.  Recommend initiation of a pureed diet with thin liquids and safe swallowing strategies/aspiration precautions as outlined below.   Pt is at increased risk of aspiration/aspiration PNA given mental status, multiple medical comorbidities, and need for assistance with feeding at present.   SLP to f/u per POC for diet tolerance and trials of upgraded textures.  RN made aware of results, recommendations, and SLP POC. Communication board in pt's room updated re: diet recommendations and pertinent aspiration precautions/safe swallowing strategies. Pt unable to receive education at this time due to AMS/lethargy.  SLP Visit Diagnosis: Dysphagia, unspecified (R13.10)    Aspiration Risk  Mild aspiration risk;Moderate aspiration risk    Diet Recommendation Dysphagia 1 (Puree);Thin liquid   Medication Administration:  (as tolerated) Supervision: Staff to assist with self feeding;Full supervision/cueing for compensatory strategies Compensations: Minimize environmental distractions;Slow rate;Small sips/bites Postural Changes: Seated upright at 90 degrees;Remain upright for at least 30 minutes after po intake    Other  Recommendations Oral Care Recommendations: Oral care QID;Staff/trained caregiver to provide oral care    Recommendations for follow up therapy are one component of a multi-disciplinary discharge planning process, led by the attending physician.  Recommendations may be updated based on patient status, additional functional criteria and insurance authorization.  Follow up Recommendations  (TBD)      Assistance Recommended at Discharge Frequent or constant Supervision/Assistance  Functional Status Assessment Patient has had a recent decline in their  functional status and demonstrates the ability to make significant improvements in function in a reasonable and predictable amount of time.  Frequency and Duration min 2x/week  2 weeks       Prognosis Prognosis for Safe Diet Advancement: Fair Barriers to Reach Goals: Cognitive deficits;Behavior      Swallow Study   General Date of Onset: 06/27/22 HPI: Per H&P "Xavier Esparza is a 76 y.o. male with medical history significant for DM, BPH, history of prostate cancer, kidney stones status post lithotripsy, negative colonoscopy 2022 (3 benign polyps), who presents to the ED by EMS following a syncopal episode at home while walking to the bathroom.  Wife said she heard a loud thud and went to find him on the floor.  She said he was awake when she got there but he could not help himself up and she called 911.  Patient states he was in his usual state of health but about 2 weeks prior he saw a little blood in his urine similar to when he had his kidney stone.  He saw his urologist yesterday who gave him a prescription for Levaquin.  He otherwise denied feeling unwell previous to the episode.  He denied chest pain, shortness of breath, headache or visual disturbance, one-sided numbness weakness or tingling, nausea or vomiting, abdominal pain, urinary frequency or burning or diarrhea.[facility] KitchenEMS reported BP 76/45 and he was treated with IM epinephrine and a 400 mL fluid bolus.  He was awake and alert x4 by arrival.  After his arrival in the emergency room he developed abdominal pain and vomiting." Type of Study: Bedside Swallow Evaluation Diet Prior to this Study:  (Clear liquid diet) Respiratory Status: Room air History of Recent Intubation: No Behavior/Cognition: Confused;Lethargic/Drowsy Oral Cavity Assessment: Dry Oral Care Completed by SLP: Yes Oral Cavity - Dentition: Adequate natural dentition Vision:  (pt did not attempt to feed self despite verbal/tactile cueing) Self-Feeding Abilities: Total  assist Patient Positioning: Upright in bed Baseline Vocal Quality: Normal Volitional Cough: Cognitively unable to elicit Volitional Swallow: Unable to elicit    Oral/Motor/Sensory Function Overall Oral Motor/Sensory Function:  (unable to assess due to AMS)   Ice Chips Ice chips: Not tested   Thin Liquid Thin Liquid: Within functional limits Presentation: Straw;Cup Other Comments: ~6 oz; single cup sips; single and consecutive straw sips    Nectar Thick Nectar Thick Liquid: Not tested   Honey Thick Honey Thick Liquid: Not tested   Puree Puree: Within functional limits Presentation: Spoon Other Comments: ~2 oz   Solid     Solid: Not tested Other Comments: pt declined despite encouragement     Cherrie Gauze, M.S., [facility] [phone] [place])  Quintella Baton 06/27/2022,12:12 PM

## 2022-06-27 NOTE — TOC Progression Note (Signed)
Transition of Care [facility]) - Progression Note    Patient Details  Name: Xavier Esparza MRN: [id number] Date of Birth: 1946-05-27  Transition of [facility] CM/SW Contact  Shelbie Hutching, RN Phone Number: 06/27/2022, 11:44 AM  Clinical Narrative:    Patient remains in the ICU stepdown status, confused, oriented to person only.  Perm Cath placed on 6/30, getting hemodialysis, last treatment on Friday 6/30.  Patient still getting TPN, he does have a diet ordered.   TOC continues to follow.    Expected Discharge Plan: Skilled Nursing Facility Barriers to Discharge: Continued Medical Work up  Expected Discharge Plan and Services Expected Discharge Plan: [place]   Discharge Planning Services: CM Consult     Expected Discharge Date: 06/09/22                                     Social Determinants of Health (SDOH) Interventions    Readmission Risk Interventions    06/14/2022   11:33 AM  Readmission Risk Prevention Plan  PCP or Specialist Appt within 3-5 Days Complete  HRI or [place] Complete  Social Work Consult for [place] Planning/Counseling Complete  Palliative Care Screening Complete  Medication Review Press photographer) Complete

## 2022-06-27 NOTE — Assessment & Plan Note (Signed)
Unable to do anticoagulation with drop in hemoglobin and upper GI bleed.

## 2022-06-28 ENCOUNTER — Inpatient Hospital Stay: Payer: Medicare Other

## 2022-06-28 ENCOUNTER — Encounter: Payer: Self-pay | Admitting: Vascular Surgery

## 2022-06-28 DIAGNOSIS — K8591 Acute pancreatitis with uninfected necrosis, unspecified: Secondary | ICD-10-CM | POA: Diagnosis not present

## 2022-06-28 DIAGNOSIS — K750 Abscess of liver: Secondary | ICD-10-CM | POA: Diagnosis not present

## 2022-06-28 DIAGNOSIS — I81 Portal vein thrombosis: Secondary | ICD-10-CM | POA: Diagnosis not present

## 2022-06-28 DIAGNOSIS — N179 Acute kidney failure, unspecified: Secondary | ICD-10-CM | POA: Diagnosis not present

## 2022-06-28 LAB — COMPREHENSIVE METABOLIC PANEL
ALT: 17 U/L (ref 0–44)
AST: 43 U/L — ABNORMAL HIGH (ref 15–41)
Albumin: 2.7 g/dL — ABNORMAL LOW (ref 3.5–5.0)
Alkaline Phosphatase: 121 U/L (ref 38–126)
Anion gap: 12 (ref 5–15)
BUN: 140 mg/dL — ABNORMAL HIGH (ref 8–23)
CO2: 19 mmol/L — ABNORMAL LOW (ref 22–32)
Calcium: 9.4 mg/dL (ref 8.9–10.3)
Chloride: 101 mmol/L (ref 98–111)
Creatinine, Ser: 8.84 mg/dL — ABNORMAL HIGH (ref 0.61–1.24)
GFR, Estimated: 6 mL/min — ABNORMAL LOW (ref >60)
Glucose, Bld: 203 mg/dL — ABNORMAL HIGH (ref 70–99)
Potassium: 4.8 mmol/L (ref 3.5–5.1)
Sodium: 132 mmol/L — ABNORMAL LOW (ref 135–145)
Total Bilirubin: 0.9 mg/dL (ref 0.3–1.2)
Total Protein: 6.2 g/dL — ABNORMAL LOW (ref 6.5–8.1)

## 2022-06-28 LAB — CBC WITH DIFFERENTIAL/PLATELET
Abs Immature Granulocytes: 0.58 10*3/uL — ABNORMAL HIGH (ref 0.00–0.07)
Basophils Absolute: 0.1 10*3/uL (ref 0.0–0.1)
Basophils Relative: 0 %
Eosinophils Absolute: 0.1 10*3/uL (ref 0.0–0.5)
Eosinophils Relative: 0 %
HCT: 21.1 % — ABNORMAL LOW (ref 39.0–52.0)
Hemoglobin: 6.8 g/dL — ABNORMAL LOW (ref 13.0–17.0)
Immature Granulocytes: 2 %
Lymphocytes Relative: 3 %
Lymphs Abs: 0.7 10*3/uL (ref 0.7–4.0)
MCH: 30.9 pg (ref 26.0–34.0)
MCHC: 32.2 g/dL (ref 30.0–36.0)
MCV: 95.9 fL (ref 80.0–100.0)
Monocytes Absolute: 1.7 10*3/uL — ABNORMAL HIGH (ref 0.1–1.0)
Monocytes Relative: 7 %
Neutro Abs: 22.1 10*3/uL — ABNORMAL HIGH (ref 1.7–7.7)
Neutrophils Relative %: 88 %
Platelets: 107 10*3/uL — ABNORMAL LOW (ref 150–400)
RBC: 2.2 MIL/uL — ABNORMAL LOW (ref 4.22–5.81)
RDW: 20.6 % — ABNORMAL HIGH (ref 11.5–15.5)
WBC: 25.2 10*3/uL — ABNORMAL HIGH (ref 4.0–10.5)
nRBC: 0.1 % (ref 0.0–0.2)

## 2022-06-28 LAB — GLUCOSE, CAPILLARY
Glucose-Capillary: 193 mg/dL — ABNORMAL HIGH (ref 70–99)
Glucose-Capillary: 189 mg/dL — ABNORMAL HIGH (ref 70–99)
Glucose-Capillary: 185 mg/dL — ABNORMAL HIGH (ref 70–99)
Glucose-Capillary: 215 mg/dL — ABNORMAL HIGH (ref 70–99)
Glucose-Capillary: 174 mg/dL — ABNORMAL HIGH (ref 70–99)
Glucose-Capillary: 191 mg/dL — ABNORMAL HIGH (ref 70–99)
Glucose-Capillary: 142 mg/dL — ABNORMAL HIGH (ref 70–99)

## 2022-06-28 LAB — RENAL FUNCTION PANEL
Albumin: 2.7 g/dL — ABNORMAL LOW (ref 3.5–5.0)
Anion gap: 13 (ref 5–15)
BUN: 103 mg/dL — ABNORMAL HIGH (ref 8–23)
CO2: 22 mmol/L (ref 22–32)
Calcium: 8.9 mg/dL (ref 8.9–10.3)
Chloride: 100 mmol/L (ref 98–111)
Creatinine, Ser: 7.21 mg/dL — ABNORMAL HIGH (ref 0.61–1.24)
GFR, Estimated: 7 mL/min — ABNORMAL LOW (ref >60)
Glucose, Bld: 193 mg/dL — ABNORMAL HIGH (ref 70–99)
Phosphorus: 5.9 mg/dL — ABNORMAL HIGH (ref 2.5–4.6)
Potassium: 4.2 mmol/L (ref 3.5–5.1)
Sodium: 135 mmol/L (ref 135–145)

## 2022-06-28 LAB — HEMOGLOBIN AND HEMATOCRIT, BLOOD
HCT: 23.6 % — ABNORMAL LOW (ref 39.0–52.0)
Hemoglobin: 8 g/dL — ABNORMAL LOW (ref 13.0–17.0)

## 2022-06-28 LAB — MAGNESIUM: Magnesium: 2.3 mg/dL (ref 1.7–2.4)

## 2022-06-28 LAB — TRIGLYCERIDES: Triglycerides: 150 mg/dL — ABNORMAL HIGH (ref <150)

## 2022-06-28 LAB — PREPARE RBC (CROSSMATCH)

## 2022-06-28 LAB — PHOSPHORUS: Phosphorus: 6.9 mg/dL — ABNORMAL HIGH (ref 2.5–4.6)

## 2022-06-28 LAB — PSA: Prostatic Specific Antigen: 0.34 ng/mL (ref 0.00–4.00)

## 2022-06-28 MED ORDER — SODIUM CHLORIDE 0.9% IV SOLUTION: Freq: Once | INTRAVENOUS | Status: AC

## 2022-06-28 MED ORDER — ORAL CARE MOUTH RINSE: 15.0000 mL | OROMUCOSAL | Status: DC | PRN

## 2022-06-28 MED ORDER — HEPARIN SODIUM (PORCINE) 1000 UNIT/ML IJ SOLN
INTRAMUSCULAR | Status: AC
  Filled 2022-06-28: qty 10

## 2022-06-28 MED ORDER — GUAIFENESIN-DM 100-10 MG/5ML PO SYRP: 15.0000 mL | ORAL_SOLUTION | ORAL | Status: DC | PRN

## 2022-06-28 MED ORDER — TRACE MINERALS CU-MN-SE-ZN 300-55-60-3000 MCG/ML IV SOLN
INTRAVENOUS | Status: DC
  Filled 2022-06-28: qty 806.4

## 2022-06-28 MED ORDER — OXYCODONE HCL 5 MG PO TABS: 5.0000 mg | ORAL_TABLET | ORAL | Status: DC | PRN

## 2022-06-28 MED ORDER — VITAL 1.5 CAL PO LIQD
1000.0000 mL | ORAL | Status: DC
  Administered 2022-06-28: 1000 mL

## 2022-06-28 MED ORDER — HYDROXYZINE HCL 10 MG PO TABS: 10.0000 mg | ORAL_TABLET | Freq: Three times a day (TID) | ORAL | Status: DC | PRN

## 2022-06-28 MED ORDER — FLUCONAZOLE IN SODIUM CHLORIDE 400-0.9 MG/200ML-% IV SOLN
400.0000 mg | INTRAVENOUS | Status: DC
Start: 2022-06-28 — End: 2022-06-29
  Administered 2022-06-28: 400 mg via INTRAVENOUS
  Filled 2022-06-28 (×2): qty 200

## 2022-06-28 MED ORDER — ORAL CARE MOUTH RINSE
15.0000 mL | OROMUCOSAL | Status: DC
  Administered 2022-06-28 – 2022-06-29 (×5): 15 mL via OROMUCOSAL

## 2022-06-28 MED ORDER — RENA-VITE PO TABS
1.0000 | ORAL_TABLET | Freq: Every day | ORAL | Status: DC
  Administered 2022-06-28: 1 via ORAL
  Filled 2022-06-28: qty 1

## 2022-06-28 MED ORDER — FREE WATER
30.0000 mL | Status: DC
  Administered 2022-06-28 – 2022-06-29 (×5): 30 mL

## 2022-06-28 NOTE — Progress Notes (Signed)
PHARMACY - TOTAL PARENTERAL NUTRITION CONSULT NOTE   Indication: without adequate nutrition for > 7 days / ileus  Patient Measurements: Height: 6' (182.9 cm) Weight: 102 kg (224 lb 13.9 oz) IBW/kg (Calculated) : 77.6 TPN AdjBW (KG): 83.7 Body mass index is 30.5 kg/m.  Assessment: 76 y/o male with h/o DM, BPH, prostate cancer, hiatal hernia, kidney stones and HTN who is admitted with severe necrotizing pancreatitis, portal vein thrombosis, liver lesions, AKI and sepsis.  Glucose / Insulin: BG 188 - 218 (25 units SSI required). Moderate SSI + 20u insulin in TPN + Semglee 5u BID Electrolytes: Mild hyponatremia. Hyperphosphatemia Renal: CRRT stopped 6/28 PM and transitioned to iHD (first HD 6/29). Next session planned for 7/3. Hepatic: Transaminitis resolved. LFTs within normal limits Intake / Output; MIVF: + 6.3L for the admission GI Imaging: 06/21/22 Abd Xray shows diffuse gaseous distention of bowel compatible with ileus GI Surgeries / Procedures: none recent  Central access: 06/17/22  TPN start date: 06/17/22   Nutritional Goals: Goal TPN rate is 70 mL/hr (provides 121 g of protein and 2241 kcals per day)  RD Assessment: Estimated Needs Total Energy Estimated Needs: 2200-2500kcal/day Total Protein Estimated Needs: 120-135g/day Total Fluid Estimated Needs: 2.1-2.4Lday  Current Nutrition:  Thin liquids Boost Breeze po TID  Plan:  Continue TPN at 70 mL/hr --Changes made to decrease total volume provided via TPN given anuric requiring HD to avoid fluid overload Nutritional components: Amino acids (using 15% Clinisol): 121 g Dextrose: 319 g Lipids (using 20% SMOFlipids): 67 g kCal 2241 Volume: 1680 mL (1780 mL with overfill) Electrolytes in TPN: Na 357mq/L, K 267m/L, Ca 57m38mL, Mg 1mE47m, and Phos 1 mmol/L. Cl:Ac 1:1 Defer management of hyperphosphatemia to nephrology Add standard MVI and trace elements to TPN Continue moderate q4h SSI and adjust as needed  Continue 20u  insulin in TPN per diabetes coordinator recommendations Semglee 5u BID added per TRH Defer further insulin adjustment to TRH. Patient does have limited enteral intake and including feeding supplements. Glucose was fairly well controlled on 20u insulin in TPN prior to these additions Monitor TPN labs on Mon/Thurs, daily until stable  AlexBenita Gutter/2023,9:03 AM

## 2022-06-28 NOTE — Assessment & Plan Note (Signed)
Patient was started on dialysis for AKI and anuria on 6/16. Received CRRT from 6/22 - 6/29. Permanent HD catheter was placed by vascular surgery on 6/30 and he was started on intermittent dialysis. Significant increase in BUN and creatinine .  Patient remained anuric.  Last dialysis was on Friday. Nephrology is on board-will be getting dialysis today.

## 2022-06-28 NOTE — Progress Notes (Signed)
Nutrition Follow Up Note   DOCUMENTATION CODES:   Obesity unspecified  INTERVENTION:   Continue TPN per pharmacy- provides 2241kcal/day and 121g/day protein   Vital 1.5'@65ml' /hr- initiate at 1m/hr, once tolerating, increase by 130mhr q 8 hours until goal rate of 6567mr is reached.   Pro-Source 34m49mD via tube  Free water flushes 30ml9mhours to maintain tube patency   Regimen at goal rate provides 2420kcal/day, 127g/day protein and 1371ml/28mof free water   Rena-vit daily via tube   NUTRITION DIAGNOSIS:   Inadequate oral intake related to acute illness as evidenced by NPO status.  GOAL:   Patient will meet greater than or equal to 90% of their needs -met with TPN   MONITOR:   Diet advancement, Labs, Weight trends, TF tolerance, Skin, I & O's, TPN  ASSESSMENT:   76 y/o55ale with h/o DM, BPH, prostate cancer, hiatal hernia, kidney stones and HTN who is admitted with severe necrotizing pancreatitis, portal vein thrombosis, liver lesions, AKI and sepsis.  - Pt s/p initiation of HD 6/16 -Pt s/p EGD 6/23; pt noted to have gastric and duodenal ulcers -Pt s/p TPN initiation 6/22  Pt tolerating TPN at goal rate. Triglycerides remain slightly elevated. Refeed labs stable. Pt with continued hyperglycemia; insulin added in TPN. NGT removed 7/1. Pt advanced to a dysphagia 1 diet on 7/2 but has continued to have poor oral intake. NGT replaced today; will plan to initiate trickle tube feeds today. KUB from today reports improvement of gaseous distention of the small bowel loops. Per chart, pt appears fairly weight stable since admission. Pt +7.2L on his I & Os. Pt continues on HD. Plan is for transfer to [facility]   Medications reviewed and include: heparin, insulin, rena-vit, protonix, albumin, diflucan, meropenem  Labs reviewed: Na 132(L), K 4.8 wnl, BUN 140(H), creat 8.84(H), P 6.9(H), Mg 2.3 wnl Triglycerides- 150(H) Vitamin D- 26.93(L)- 6/22 Wbc- 25.2(H), Hgb 6.8(L), Hct  21.1(L) Cbgs- 185, 189, 193 x 24 hrs  Diet Order:   Diet Order             Diet NPO time specified  Diet effective now           Diet - low sodium heart healthy                  EDUCATION NEEDS:   Education needs have been addressed  Skin:  Skin Assessment: Reviewed RN Assessment  Last BM:  7/3- Type 6  Height:   Ht Readings from Last 1 Encounters:  06/10/22 6' (1.829 m)    Weight:   Wt Readings from Last 1 Encounters:  06/28/22 102 kg    Ideal Body Weight:  80.9 kg  BMI:  Body mass index is 30.5 kg/m.  Estimated Nutritional Needs:   Kcal:  2200-2500kcal/day  Protein:  120-135g/day  Fluid:  2.1-2.4Lday  Keante Urizar Koleen DistanceD, LDN Please refer to [facility] and/or RD on-call/weekend/after hours pager

## 2022-06-28 NOTE — Assessment & Plan Note (Signed)
Last hemoglobin 6.8.  No obvious bleeding, can be iatrogenic due to frequent blood draws. Patient received total of 4 units of packed red blood cells during the hospital course.  Had GI bleed on 06/16/2022.  Having some blood loss with the continuous dialysis if the filter gets clotted and unable to return blood. Anemia panel consistent with anemia of chronic disease with some iron deficiency. -Give him 1 more unit of PRBC -Will start him on iron supplement once continue to tolerate p.o. intake.

## 2022-06-28 NOTE — Assessment & Plan Note (Signed)
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  With low-grade fever on 6/23 and I repeated blood cultures which are negative for 3 days.  Repeat CT scan does not show any abscess on the pancreas but does show fluid collection still in the liver which could be an abscess.  Empirically on meropenem and Diflucan.  Tumor markers ordered including CEA normal range at 0.8 and normal alpha-fetoprotein at less than 1.8.  CA 19-9 within normal range currently on TPN.   Patient is critically ill.  Appreciate palliative care consultation. After accidental removal of NG tube patient is tolerating some p.o. intake. Repeat CT abdomen with some increase in peripancreatic fluid, there was some new omental nodularity concerning for metastatic disease noted.  Tumor markers were negative but he also has an history of prostate cancer. -Check PSA-within normal limit -Continue antibiotics -NG tube was replaced due to worsening abdominal distention, as KUB was without any significant abnormality, we can try trickle feed again. -Continue with TPN

## 2022-06-28 NOTE — Progress Notes (Signed)
Date of Admission:  06/08/2022   Total days of antibiotics  6/13>6/14 cefepime, flagyl and vanco 6/14>>06/17/22 - Piptazo 06/18/22 Meropenem 06/19/22- Fluconazole  ID: Xavier Esparza is a 76 y.o. male  Principal Problem:   Acute necrotizing pancreatitis Active Problems:   Diabetes mellitus without complication (HCC)   BPH (benign prostatic hyperplasia)   Nephrolithiasis   Portal vein thrombosis   AKI (acute kidney injury) ([place])   Severe sepsis (HCC)   Syncope   History of prostate cancer   Upper GI bleed   Acute blood loss anemia   Acute metabolic encephalopathy    Subjective: Pt lying in bed with eyes closed When I called his name he responded appropriately , opened his eyes, but then later he was confused  Medications:   Chlorhexidine Gluconate Cloth  6 each Topical Q0600   feeding supplement  1 Container Oral TID BM   heparin injection (subcutaneous)  5,000 Units Subcutaneous Q8H   insulin aspart  0-15 Units Subcutaneous Q4H   insulin glargine-yfgn  5 Units Subcutaneous BID   multivitamin  1 tablet Oral QHS   mouth rinse  15 mL Mouth Rinse 4 times per day   pantoprazole  40 mg Intravenous Q12H   sodium chloride flush  10-40 mL Intracatheter Q12H    Objective: Vital signs in last 24 hours: Temp:  [97 F (36.1 C)-98.1 F (36.7 C)] 97.4 F (36.3 C) (07/03 0746) Pulse Rate:  [71-78] 77 (07/02 2100) Resp:  [17-35] 18 (07/02 2100) BP: (115-143)/(61-76) 131/67 (07/02 2100) SpO2:  [96 %-100 %] 98 % (07/02 2100) Weight:  [102 kg] 102 kg (07/03 0319)  LDA  HD cath rt femoral 06/19/22 PICC 06/17/22  No history available from patient  PHYSICAL EXAM:  General: SOB,  restless, awake responds to calling his name  Lungs: b/l air entry Heart: tachycardia. Abdomen: Soft,   distension++ Bowel sounds sluggish Extremities: atraumatic, no cyanosis. No edema. No clubbing Skin: No rashes or lesions. Or bruising Lymph: Cervical, supraclavicular normal. NG tube  Lab  Results Recent Labs    06/27/22 0533 06/27/22 0722 06/28/22 0333 06/28/22 0741  WBC 21.4*  --  25.2*  --   HGB 7.0*  --  6.8*  --   HCT 20.3*  --  21.1*  --   NA  --  135  --  132*  K  --  4.2  --  4.8  CL  --  100  --  101  CO2  --  22  --  19*  BUN  --  103*  --  140*  CREATININE  --  7.21*  --  8.84*   Liver Panel Recent Labs    06/27/22 0722 06/28/22 0741  PROT  --  6.2*  ALBUMIN 2.7* 2.7*  AST  --  43*  ALT  --  17  ALKPHOS  --  121  BILITOT  --  0.9    Microbiology: 06/08/22 BC NG 06/18/22 BC- NG  Radiology 06/26/22   Severe necrotizing pancreatitis with increase in peripancreatic fluid between post wall of stomach and pancreas Extensive omental nodularity Enteritis invloving distal Small bowel Bibasilar consolidation Rt hepatic lobe density in relation to portal vein thrombosis and suspected microabscesses       Assessment/Plan: Acute necrotizing pancreatitis with multiorgan failure Peripancreatic fluid collection worse between the body of pancreas and posterior stomach wall Ascites 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  Also on fluconazole. Will need to treat the liver phlegmon with 3-4 weeks of antibiotic Leukocytosis secondary to necrotic pancreatitis, liver abscess,  ? Consider EUS cystogastrostomy at an academic center  The etiology for pancreatitis is unclear. Could it be the 2 doses of levaquin he took?  GI bleed secondary to duodenal ulcer.   No further bleeding   Encephalopathy Combination of pancreatitis, AKI, medications, uremia  AKI on HD  thrombocytopenia  Anemia has received blood transfusion   Discussed the management with the care team. ID will follow him peripherally tomorrow- On call team available by phone

## 2022-06-28 NOTE — Progress Notes (Incomplete)
       CROSS COVER NOTE  NAME: Xavier Esparza MRN: [id number] DOB : July 07, 1946    Date of Service   06/28/22  HPI/Events of Note   Accessory muscle use,   Interventions   Plan: Suction airway BiPAP while waiting for iHD Dialysis RN contacted and coming to dialyze patient now CXR Robinol?        This document was prepared using Dragon voice recognition software and may include unintentional dictation errors.  Neomia Glass DNP, MHA, FNP-BC Nurse Practitioner [facility] Pager [phone]

## 2022-06-28 NOTE — Progress Notes (Signed)
Progress Note   Patient: Xavier Esparza HGD:[id number] DOB: [birth year] DOA: 06/08/2022     20 DOS: the patient was seen and examined on 06/28/2022   Brief hospital course: 76 y.o. male with medical history significant for DM, BPH, history of prostate cancer, kidney stones status post lithotripsy, negative colonoscopy 2022 (3 benign polyps), who presents to the ED by EMS following a syncopal episode at home while walking to the bathroom.  Wife said she heard a loud thud and went to find him on the floor.  She said he was awake when she got there but he could not help himself up and she called 911.  Patient states he was in his usual state of health but about 2 weeks prior he saw a little blood in his urine similar to when he had his kidney stone.  He saw his urologist yesterday who gave him a prescription for Levaquin.  He otherwise denied feeling unwell previous to the episode.  He denied chest pain, shortness of breath, headache or visual disturbance, one-sided numbness weakness or tingling, nausea or vomiting, abdominal pain, urinary frequency or burning or diarrhea.[facility] reported BP 76/45 and he was treated with IM epinephrine and a 400 mL fluid bolus.  He was awake and alert x4 by arrival.  After his arrival in the emergency room he developed abdominal pain and vomiting.   Initial CT abdomen pelvis with moderate acute pancreatitis no evidence of pancreatic necrosis or pseudocyst.  Follow-up MRI abdomen concerning for possible choledocholithiasis with evidence of necrotizing pancreatitis.  Case discussed with GI Dr. Marius Ditch at [facility].   There was mention of possible portal vein thrombosis.  Heparin GTT was initially started however patient developed suspected acute hematuria so heparin GTT was stopped.  VQ scan ordered and perfusion component will be completed prior to patient transfer.  6/15: Clinical status overall unchanged.  Patient remains with abdominal pain associated with nausea.  Hemodynamically  stable.  Mild elevation in white count.  Lipase and LFTs have improved over interval.  Discussed case with GI and general surgery.  Likely that patient's presentation is secondary to necrotizing pancreatitis.  Currently as there is no CBD stone and there is no strong indication for transfer for ERCP.  From a surgical standpoint no intervention is recommended.  There is nothing from an interventional radiology standpoint to drain.  As such we will discontinue transfer and keep patient in [facility].   6/16: No appreciable change in clinical status.  LFTs are downtrending.  White count slightly downtrending.  No fevers noted over interval.  Increasing abdominal distention noted.  Ascites and peripancreatic fluid seen on repeat CAT scan today.  Discussed case with PCCM, nephrology, GI.  Will attempt postpyloric feedings.  Kidney function continues to deteriorate.  Nephrology will initiate hemodialysis today.   6/17: No change in clinical status.  White count remains minimally elevated.  No fevers over interval.  Remains with abdominal distention.  Started HD yesterday.  We will run another round of HD today.   6/18: No appreciable clinical changes.  White blood cell count slightly downtrending.  Creatinine worsened despite 2 rounds of hemodialysis.  Remains with hematuria despite stopping heparin 2 days ago.   6/19: Increasing abd distention.  KUB consistent with ileus.  [place] negative for ascites   6/20: Postpyloric Dobbhoff discontinued.  NGT placed.  Tip in stomach. Set to low intermittent suction.  Rectal tube in place.  Abdominal distention improved.  White blood cell count increased to 22  For the week of 6/21 through 06/22/2022:  the patient had an upper GI bleed and was transfused 3 units of packed red blood cells.  He still remains anemic.  EGD showing gastric ulcers.  Patient was initially on Protonix drip now on Protonix twice a day.  Last hemoglobin 7.3.  CRRT was started on 06/17/2022 and continuing  as per nephrology.  The patient is still anuric.  The patient severe necrotizing pancreatitis.  Repeat CT scan showed increased nodularity in the omentum.  No discrete abscess about the pancreas.  Did see possible liver abscess.  Antibiotics were changed over to meropenem and Diflucan.  Repeat blood cultures on 06/18/2022 are negative.  The patient did have an ileus on x-ray so tube feeds were stopped.  Patient is on TPN and has a PICC line.  The patient's mental status has waxed and waned during the week and CT scan of the head was negative.  6/28: Patient continued to experience abdominal pain.  Nephrology is planning to continue CRRT for now.  Continue to have NG tube but secretions are decreasing.  Remained on TPN.  6/29: Clinically seems stable.  Continue to require pain medications.  Nephrology is going to start him on intermittent dialysis and stopping CRRT.  Also trying trickle feeds through NG tube as NG tube secretions remained minimal. Hypophosphatemia which is being repleted. Anemia panel consistent with anemia of chronic disease with some iron deficiency. Ferritin levels elevated. We will start him on iron supplement once tube feed established.  6/30: Patient with very slow improvement.  Able to tolerate trickle food, rate was increased today. Started on intermittent dialysis.  Going for permanent HD catheter today.  7/1: Patient continued to have low-grade fever at 100.  Had his HD catheter placed by vascular surgery yesterday.  Hemoglobin dropped to 6.2 this morning, getting 1 unit of PRBC. Ordered repeat CT abdomen due to persistent low-grade fever and leukocytosis. NG tube was pulled out by patient, will try p.o. intake before replacing.  7/2: Patient becomes more confused and agitated overnight.  Significant elevation of BUN to 103 and creatinine of 7.21, last dialysis was on Friday.  Tolerating some p.o. intake, remained on TPN. CT abdomen was completed last night and shows as  below. 1. Findings consistent with marked severity acute necrotizing pancreatitis, with interval increase in peripancreatic fluid since the prior study. 2. Interval development of marked severity enteritis involving multiple loops of distal small bowel. 3. Extensive omental nodularity and thickening throughout the abdomen and pelvis, concerning for the presence of metastatic disease. 4. 6 mm nonobstructing right renal calculus. 5. Colonic diverticulosis. 6. Moderate to marked severity bibasilar consolidation with a small right pleural effusion. 7. Multiple cystic appearing areas scattered throughout the liver parenchyma, consistent with hepatic cysts. 8. Stable heterogeneous density within the right hepatic lobe which corresponds to a prior region of portal vein thrombosis and suspected micro abscesses. 9. Small hiatal hernia. 10. Interval removal of the nasogastric tube and Foley catheter seen on the prior study. 11. Aortic atherosclerosis. Patient has an history of prostate cancer, checking PSA for concern of omental nodularity.  7/3: Patient was becoming more lethargic and somnolent with markedly elevated BUN at 140.  Denies any pain but abdomen seems more distended so KUB was obtained which was without any significant abnormality.  NG tube was placed for suctioning which resulted in improvement in abdominal distention.  Wife requested for [facility] transfer stating that Dr. Tillie Rung has accepted him and he can  go to [place] now.  Contacted [facility] transfer center and apparently she is a GI provider and does not have admitting privileges.  Patient has to go either through medical or surgical services, request was made again and waiting for their response. Going for dialysis today. Another unit of PRBC was ordered today.   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  With low-grade fever on 6/23  and I repeated blood cultures which are negative for 3 days.  Repeat CT scan does not show any abscess on the pancreas but does show fluid collection still in the liver which could be an abscess.  Empirically on meropenem and Diflucan.  Tumor markers ordered including CEA normal range at 0.8 and normal alpha-fetoprotein at less than 1.8.  CA 19-9 within normal range currently on TPN.   Patient is critically ill.  Appreciate palliative care consultation. After accidental removal of NG tube patient is tolerating some p.o. intake. Repeat CT abdomen with some increase in peripancreatic fluid, there was some new omental nodularity concerning for metastatic disease noted.  Tumor markers were negative but he also has an history of prostate cancer. -Check PSA-within normal limit -Continue antibiotics -NG tube was replaced due to worsening abdominal distention, as KUB was without any significant abnormality, we can try trickle feed again. -Continue with TPN  Acute blood loss anemia Last hemoglobin 6.8.  No obvious bleeding, can be iatrogenic due to frequent blood draws. Patient received total of 4 units of packed red blood cells during the hospital course.  Had GI bleed on 06/16/2022.  Having some blood loss with the continuous dialysis if the filter gets clotted and unable to return blood. Anemia panel consistent with anemia of chronic disease with some iron deficiency. -Give him 1 more unit of PRBC -Will start him on iron supplement once continue to tolerate p.o. intake.   Upper GI bleed Secondary to duodenal ulcers.  No active bleeding on EGD.  Monitor closely with heparin with dialysis.  On Protonix IV twice daily  Severe sepsis ([place]) Present on admission secondary to severe necrotizing pancreatitis.  Empirically on meropenem and Diflucan.  Repeat blood cultures negative.  Acute metabolic encephalopathy Mental status waxes and wanes..  Will give as needed low-dose Seroquel at night. Need to continue  pain medications.  AKI (acute kidney injury) [facility]) Patient was started on dialysis for AKI and anuria on 6/16. Received CRRT from 6/22 - 6/29. Permanent HD catheter was placed by vascular surgery on 6/30 and he was started on intermittent dialysis. Significant increase in BUN and creatinine .  Patient remained anuric.  Last dialysis was on Friday. Nephrology is on board-will be getting dialysis today.  Portal vein thrombosis Unable to do anticoagulation with drop in hemoglobin and upper GI bleed.  Syncope Secondary to hypotension   Nephrolithiasis Nonobstructing right renal stone.  No acute issues suspected  History of prostate cancer Repeat CT abdomen with concern of omental metastatic disease. -Check PSA  BPH (benign prostatic hyperplasia) No acute issues  Diabetes mellitus without complication (HCC) Sugars elevated while on TPN.  -Add Semglee 5 units twice daily -Continue with SSI     Subjective: Patient seems more somnolent and denies any pain.  Wife at bedside and requesting transfer to [facility].  Physical Exam: Vitals:   06/28/22 1000 06/28/22 1100 06/28/22 1200 06/28/22 1300  BP: (!) 97/42 119/66 122/61 125/64  Pulse: 76 73 82 79  Resp: (!) 26 18 20  17  Temp:   (!) 97.2 F (36.2 C)   TempSrc:   Axillary   SpO2: 94% 92% 95% 94%  Weight:      Height:       General.  Ill-appearing lethargic elderly man, in no acute distress. Pulmonary.  Lungs clear bilaterally, normal respiratory effort. CV.  Regular rate and rhythm, no JVD, rub or murmur. Abdomen.  Soft, nontender, nondistended, BS positive. CNS.  Somnolent, no apparent focal deficit Extremities.  No edema, no cyanosis, pulses intact and symmetrical. Psychiatry.  Judgment and insight appears impaired.  Data Reviewed: Prior data reviewed  Family Communication: Discussed with wife at bedside  Disposition: Status is: Inpatient Remains inpatient appropriate because: Severity of illness   Planned Discharge  Destination: To be determined   Time spent: 55 minutes  This record has been created using Systems analyst. Errors have been sought and corrected,but may not always be located. Such creation errors do not reflect on the standard of care.  Author: Lorella Nimrod, MD 06/28/2022 2:30 PM  For on call review [email or web address].

## 2022-06-28 NOTE — Progress Notes (Addendum)
[facility]  ROUNDING NOTE   Subjective:   Patient seen resting quietly, critically ill-appearing Wife at bedside Remains on nasal cannula N.p.o., Foley in place TPN 70 mL/h Increased abdominal distention  Objective:  Vital signs in last 24 hours:  Temp:  [97 F (36.1 C)-98.1 F (36.7 C)] 97.4 F (36.3 C) (07/03 0746) Pulse Rate:  [71-78] 77 (07/02 2100) Resp:  [17-35] 18 (07/02 2100) BP: (120-143)/(61-76) 131/67 (07/02 2100) SpO2:  [96 %-100 %] 98 % (07/02 2100) Weight:  [102 kg] 102 kg (07/03 0319)  Weight change: 0 kg Filed Weights   06/26/22 0456 06/27/22 0500 06/28/22 0319  Weight: 97.9 kg 102 kg 102 kg    Intake/Output: I/O last 3 completed shifts: In: 2467.8 [P.O.:240; I.V.:1882; Other:60; IV Piggyback:285.8] Out: 40 [Stool:40]   Intake/Output this shift:  No intake/output data recorded.  Physical Exam: General: Critically ill-appearing  Head: Normocephalic, atraumatic. Dry oral mucosal membranes  Eyes: Anicteric  Lungs:  Diminished in bases, tachypnea, [place] O2  Heart: Regular rate and rhythm  Abdomen:  Firm, distended,  tenderness  Extremities: Trace peripheral edema.  Neurologic: Awake, alert  Skin: Warm, dry  Access: Right IJ PermCath 4/70/9628    Basic Metabolic Panel: Recent Labs  Lab 06/24/22 0613 06/24/22 1353 06/24/22 1949 06/25/22 0531 06/26/22 0501 06/27/22 0533 06/27/22 0722 06/28/22 0741  NA 138  --  136 137 135  --  135 132*  K 4.2  --  3.8 3.9 4.0  --  4.2 4.8  CL 107  --  99 103 101  --  100 101  CO2 27  --  '25 24 25  '$ --  22 19*  GLUCOSE 189*  --  160* 184* 201*  --  193* 203*  BUN 51*  --  48* 71* 72*  --  103* 140*  CREATININE 2.62*  --  3.20* 4.00* 4.65*  --  7.21* 8.84*  CALCIUM 8.2*  --  8.1* 8.5* 8.3*  --  8.9 9.4  MG 2.4  --   --  2.0 2.1 2.5*  --  2.3  PHOS 1.4*   < > 4.1  4.1 4.1 4.4  --  5.9* 6.9*   < > = values in this interval not displayed.     Liver Function Tests: Recent Labs  Lab  06/24/22 3662 06/24/22 1949 06/25/22 0531 06/26/22 0501 06/27/22 0722 06/28/22 0741  AST 38  --   --   --   --  43*  ALT 18  --   --   --   --  17  ALKPHOS 98  --   --   --   --  121  BILITOT 0.4  --   --   --   --  0.9  PROT 5.9*  --   --   --   --  6.2*  ALBUMIN 2.3* 2.3* 2.4* 2.6* 2.7* 2.7*    No results for input(s): "LIPASE", "AMYLASE" in the last 168 hours. No results for input(s): "AMMONIA" in the last 168 hours.    CBC: Recent Labs  Lab 06/24/22 0613 06/25/22 0531 06/26/22 0501 06/26/22 1616 06/27/22 0533 06/28/22 0333  WBC 25.8* 25.7* 23.1*  --  21.4* 25.2*  NEUTROABS 21.4* 21.2* 18.6*  --  18.0* 22.1*  HGB 7.6* 7.1* 6.2* 7.4* 7.0* 6.8*  HCT 23.4* 21.3* 18.7* 22.2* 20.3* 21.1*  MCV 85.4 84.9 84.2  --  92.7 95.9  PLT 175 179 126*  --  132* 107*  Cardiac Enzymes: No results for input(s): "CKTOTAL", "CKMB", "CKMBINDEX", "TROPONINI" in the last 168 hours.   BNP: Invalid input(s): "POCBNP"  CBG: Recent Labs  Lab 06/27/22 1557 06/27/22 1936 06/27/22 2322 06/28/22 0309 06/28/22 0734  GLUCAP 200* 218* 200* 193* 189*     Microbiology: Results for orders placed or performed during the hospital encounter of 06/08/22  Blood culture (routine x 2)     Status: None   Collection Time: 06/08/22  1:33 PM   Specimen: BLOOD  Result Value Ref Range Status   Specimen Description BLOOD LEFT HAND  Final   Special Requests   Final    BOTTLES DRAWN AEROBIC AND ANAEROBIC Blood Culture results may not be optimal due to an inadequate volume of blood received in culture bottles   Culture   Final    NO GROWTH 5 DAYS Performed at [facility], [address] [place], [place] [address]    Report Status 06/13/2022 FINAL  Final  Blood culture (routine x 2)     Status: None   Collection Time: 06/08/22  2:15 PM   Specimen: BLOOD  Result Value Ref Range Status   Specimen Description BLOOD BLOOD LEFT WRIST  Final   Special Requests   Final    BOTTLES DRAWN  AEROBIC AND ANAEROBIC Blood Culture results may not be optimal due to an inadequate volume of blood received in culture bottles   Culture   Final    NO GROWTH 5 DAYS Performed at [facility], [address], [place], [place] [address]    Report Status 06/13/2022 FINAL  Final  MRSA Next Gen by PCR, Nasal     Status: None   Collection Time: 06/10/22  4:14 PM   Specimen: Nasal Mucosa; Nasal Swab  Result Value Ref Range Status   MRSA by PCR Next Gen NOT DETECTED NOT DETECTED Final    Comment: (NOTE) The GeneXpert MRSA Assay (FDA approved for NASAL specimens only), is one component of a comprehensive MRSA colonization surveillance program. It is not intended to diagnose MRSA infection nor to guide or monitor treatment for MRSA infections. Test performance is not FDA approved in patients less than 58 years old. Performed at [facility], [address] [place], [place] [address]   SARS Coronavirus 2 by RT PCR (hospital order, performed in [facility] hospital lab) *cepheid single result test* Anterior Nasal Swab     Status: None   Collection Time: 06/10/22  5:17 PM   Specimen: Anterior Nasal Swab  Result Value Ref Range Status   SARS Coronavirus 2 by RT PCR NEGATIVE NEGATIVE Final    Comment: (NOTE) SARS-CoV-2 target nucleic acids are NOT DETECTED.  The SARS-CoV-2 RNA is generally detectable in upper and lower respiratory specimens during the acute phase of infection. The lowest concentration of SARS-CoV-2 viral copies this assay can detect is 250 copies / mL. A negative result does not preclude SARS-CoV-2 infection and should not be used as the sole basis for treatment or other patient management decisions.  A negative result may occur with improper specimen collection / handling, submission of specimen other than nasopharyngeal swab, presence of viral mutation(s) within the areas targeted by this assay, and inadequate number of viral copies (<250 copies / mL). A  negative result must be combined with clinical observations, patient history, and epidemiological information.  Fact Sheet for Patients:   https://www.patel.info/  Fact Sheet for Healthcare Providers: https://hall.com/  This test is not yet approved or  cleared by  the [employer] and has been authorized for detection and/or diagnosis of SARS-CoV-2 by FDA under an Emergency Use Authorization (EUA).  This EUA will remain in effect (meaning this test can be used) for the duration of the COVID-19 declaration under Section 564(b)(1) of the Act, 21 U.S.C. section 360bbb-3(b)(1), unless the authorization is terminated or revoked sooner.  Performed at [facility], [address] [place], [place] [address]   Culture, blood (Routine X 2) w Reflex to ID Panel     Status: None   Collection Time: 06/18/22 10:03 AM   Specimen: BLOOD  Result Value Ref Range Status   Specimen Description BLOOD RIGHT HAND  Final   Special Requests   Final    BOTTLES DRAWN AEROBIC ONLY Blood Culture adequate volume   Culture   Final    NO GROWTH 5 DAYS Performed at [facility], [address] [place], [place] [address]    Report Status 06/23/2022 FINAL  Final  Culture, blood (Routine X 2) w Reflex to ID Panel     Status: None   Collection Time: 06/18/22 10:20 AM   Specimen: BLOOD  Result Value Ref Range Status   Specimen Description BLOOD LEFT [facility]  Final   Special Requests   Final    BOTTLES DRAWN AEROBIC AND ANAEROBIC Blood Culture adequate volume   Culture   Final    NO GROWTH 5 DAYS Performed at [facility], [address] [place], [place] [address]    Report Status 06/23/2022 FINAL  Final    Coagulation Studies: No results for input(s): "LABPROT", "INR" in the last 72 hours.   Urinalysis: No results for input(s): "COLORURINE", "LABSPEC", "PHURINE", "GLUCOSEU", "HGBUR", "BILIRUBINUR", "KETONESUR", "PROTEINUR",  "UROBILINOGEN", "NITRITE", "LEUKOCYTESUR" in the last 72 hours.  Invalid input(s): "APPERANCEUR"     Imaging: CT ABDOMEN PELVIS WO CONTRAST  Result Date: 06/26/2022 CLINICAL DATA:  Persistent low-grade fever and leukocytosis sent to rule out abscess. EXAM: CT ABDOMEN AND PELVIS WITHOUT CONTRAST TECHNIQUE: Multidetector CT imaging of the abdomen and pelvis was performed following the standard protocol without IV contrast. RADIATION DOSE REDUCTION: This exam was performed according to the departmental dose-optimization program which includes automated exposure control, adjustment of the mA and/or kV according to patient size and/or use of iterative reconstruction technique. COMPARISON:  June 18, 2022 FINDINGS: Lower chest: Moderate to marked severity areas of consolidation are seen within the posteromedial aspects of the bilateral lung bases. Small bilateral pleural effusions are noted. Hepatobiliary: Numerous cystic appearing areas are seen scattered throughout the liver parenchyma. Heterogeneous appearance of a portion of the right lobe of the liver is again seen. This is stable in appearance and corresponds to an area of previously described portal vein thrombosis and suspected micro abscesses. No gallstones, gallbladder wall thickening, or biliary dilatation. Pancreas: There is marked severity peripancreatic inflammatory fat stranding and peripancreatic fluid. While the peripancreatic inflammatory fat stranding is predominant stable in severity, the amount of peripancreatic fluid is increased. This is most prominent within the regions in between the body of the pancreas and posterior wall of the body of the stomach. The low-attenuation lesion noted within the mid body of the pancreas on the prior study is not clearly identified. This is limited in evaluation in the absence of intravenous contrast. Spleen: Normal in size without focal abnormality. Adrenals/Urinary Tract: Adrenal glands are unremarkable.  Kidneys are normal in size, without obstructing renal calculi or hydronephrosis. A 3.6 cm diameter cyst is seen within the posterior  aspect of the mid to lower left kidney. A 6 mm nonobstructing renal calculus is seen within the lower pole of the right kidney. A small amount of air is seen within the lumen of the urinary bladder. The Foley catheter seen on the prior study has been removed. Stomach/Bowel: There is a small hiatal hernia. The nasogastric tube seen on the prior study has been removed. Appendix appears normal. No evidence of bowel dilatation. Markedly thickened and inflamed loops of small bowel are seen within the right lower quadrant and right hemipelvis (axial CT images 58 through 80, CT series 3). This represents a new finding when compared to the prior study. Noninflamed diverticula are seen throughout the large bowel. Vascular/Lymphatic: Aortic atherosclerosis. A venous catheter is seen entering via the right groin. No enlarged abdominal or pelvic lymph nodes. Reproductive: Multiple prostate radiation implantation seeds are seen. Other: No abdominopelvic ascites. Extensive omental nodularity and thickening is again seen throughout the abdomen Musculoskeletal: Moderate to marked severity anasarca is seen along the lateral aspects of the lower abdominal and pelvic walls, as well as the lateral aspects of both hips. Multilevel degenerative changes are seen throughout the lumbar spine. IMPRESSION: 1. Findings consistent with marked severity acute necrotizing pancreatitis, with interval increase in peripancreatic fluid since the prior study. 2. Interval development of marked severity enteritis involving multiple loops of distal small bowel. 3. Extensive omental nodularity and thickening throughout the abdomen and pelvis, concerning for the presence of metastatic disease. 4. 6 mm nonobstructing right renal calculus. 5. Colonic diverticulosis. 6. Moderate to marked severity bibasilar consolidation with a  small right pleural effusion. 7. Multiple cystic appearing areas scattered throughout the liver parenchyma, consistent with hepatic cysts. 8. Stable heterogeneous density within the right hepatic lobe which corresponds to a prior region of portal vein thrombosis and suspected micro abscesses. 9. Small hiatal hernia. 10. Interval removal of the nasogastric tube and Foley catheter seen on the prior study. 11. Aortic atherosclerosis. Aortic Atherosclerosis (ICD10-I70.0). Electronically Signed   By: Virgina Norfolk M.D.   On: 06/26/2022 23:43     Medications:    sodium chloride Stopped (06/25/22 1829)   albumin human 25 g (06/28/22 1055)   fluconazole (DIFLUCAN) IV     meropenem (MERREM) IV Stopped (06/27/22 1842)   TPN ADULT (ION) 70 mL/hr at 06/27/22 1853   TPN ADULT (ION)      Chlorhexidine Gluconate Cloth  6 each Topical Q0600   feeding supplement  1 Container Oral TID BM   heparin injection (subcutaneous)  5,000 Units Subcutaneous Q8H   insulin aspart  0-15 Units Subcutaneous Q4H   insulin glargine-yfgn  5 Units Subcutaneous BID   multivitamin  1 tablet Oral QHS   mouth rinse  15 mL Mouth Rinse 4 times per day   pantoprazole  40 mg Intravenous Q12H   sodium chloride flush  10-40 mL Intracatheter Q12H   sodium chloride, acetaminophen **OR** acetaminophen, alteplase, guaiFENesin-dextromethorphan, haloperidol lactate, heparin, HYDROmorphone (DILAUDID) injection, hydrOXYzine, iohexol, ipratropium-albuterol, lidocaine (PF), lidocaine-prilocaine, lip balm, ondansetron (ZOFRAN) IV, mouth rinse, mouth rinse, oxyCODONE, pentafluoroprop-tetrafluoroeth, QUEtiapine, sodium chloride flush  Assessment/ Plan:  Xavier Esparza is a 76 y.o.  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  Normal kidney function prior to admission.  IV contrast exposure on 06/08/2022 and 06/09/2022.  Renal ultrasound negative for obstruction.  Urinalysis shows hematuria and mild proteinuria. Hematuria possibly related to Foley catheter.  Patient has history of prostate cancer. Received first dialysis treatment on June 16.   Patient required CRRT starting June 22 and stopped on 06/24/22   Update: Renal function continues to decline with no urine output recorded.  Patient appears clinically worse today.  Will provide dialysis treatment later today.    Lab Results  Component Value Date   CREATININE 8.84 (H) 06/28/2022   CREATININE 7.21 (H) 06/27/2022   CREATININE 4.65 (H) 06/26/2022    Intake/Output Summary (Last 24 hours) at 06/28/2022 1104 Last data filed at 06/27/2022 2200 Gross per 24 hour  Intake 1572.13 ml  Output 20 ml  Net 1552.13 ml    Acute pancreatitis, severe necrotizing.  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   Patient continues to receive TPN and antibiotic therapy.   Diabetes mellitus type 2 without complication.Home regimen includes metformin.  Hemoglobin A1c 6.2 on 01/08/2022.  -Primary team to manage sliding scale insulin.  Acute ileus, receiving IV narcotics for severe acute pancreatitis. Tube feeds stopped and NGT placed to LWIS. Rectal tube placed  EGD showed 3 gastric ulcers, no active bleeding noted.     Abdominal distention appears worse on presentation today.  Primary team to notify surgery and reinsert NG tube for decompression.   LOS: [place] 7/3/202311:04 AM

## 2022-06-28 NOTE — Assessment & Plan Note (Signed)
Mental status waxes and wanes..  Will give as needed low-dose Seroquel at night. Need to continue pain medications.

## 2022-06-28 NOTE — Consult Note (Signed)
Pharmacy Antibiotic Note  Xavier Esparza is a 76 y.o. male with PMH including DM, BPH, prostate cancer, nephrolithiasis admitted on 06/08/2022 with  severe necrotizing pancreatitis, portal vein thrombosis, liver abscess, acute renal failure . Hospitalization complicated by persistent anuria necessitating CRRT and patient has now been transitioned to intermittent HD. Patient has further been un-able to tolerate adequate enteral nutrition / ileus and is on TPN. Pharmacy has been consulted for Diflucan dosing. Patient is also on meropenem.   Infectious diseases is following  Plan:  Meropenem 500 mg IV q24h  Diflucan 400 mg IV q24h --Continue to monitor LFTs & DDIs  Height: 6' (182.9 cm) Weight: 102 kg (224 lb 13.9 oz) IBW/kg (Calculated) : 77.6  Temp (24hrs), Avg:97.4 F (36.3 C), Min:97 F (36.1 C), Max:98.1 F (36.7 C)  Recent Labs  Lab 06/24/22 0613 06/24/22 1949 06/25/22 0531 06/26/22 0501 06/27/22 0533 06/27/22 0722 06/28/22 0333 06/28/22 0741  WBC 25.8*  --  25.7* 23.1* 21.4*  --  25.2*  --   CREATININE 2.62* 3.20* 4.00* 4.65*  --  7.21*  --  8.84*    Estimated Creatinine Clearance: 8.7 mL/min (A) (by C-G formula based on SCr of 8.84 mg/dL (H)).    No Known Allergies  Antimicrobials this admission: Zosyn 6/14 >> 6/23 Meropenem 6/23 >>  Fluconazole 6/24 >> 6/30, 7/3 >>  Dose adjustments this admission: N/A  Microbiology results: 6/13 BCx: NG 6/15 MRSA PCR: (-) 6/23 BCx: NG  Thank you for allowing pharmacy to be a part of this patient's care.  Benita Gutter 06/28/2022 11:11 AM

## 2022-06-29 DIAGNOSIS — K8591 Acute pancreatitis with uninfected necrosis, unspecified: Secondary | ICD-10-CM | POA: Diagnosis not present

## 2022-06-29 DIAGNOSIS — D62 Acute posthemorrhagic anemia: Secondary | ICD-10-CM | POA: Diagnosis not present

## 2022-06-29 LAB — RETIC PANEL
Immature Retic Fract: 33.2 % — ABNORMAL HIGH (ref 2.3–15.9)
RBC.: 2.47 MIL/uL — ABNORMAL LOW (ref 4.22–5.81)
Retic Count, Absolute: 15.2 10*3/uL — ABNORMAL LOW (ref 19.0–186.0)
Retic Ct Pct: 0.6 % (ref 0.4–3.1)
Reticulocyte Hemoglobin: 25.3 pg — ABNORMAL LOW (ref >27.9)

## 2022-06-29 LAB — GLUCOSE, CAPILLARY
Glucose-Capillary: 185 mg/dL — ABNORMAL HIGH (ref 70–99)
Glucose-Capillary: 203 mg/dL — ABNORMAL HIGH (ref 70–99)
Glucose-Capillary: 230 mg/dL — ABNORMAL HIGH (ref 70–99)
Glucose-Capillary: 153 mg/dL — ABNORMAL HIGH (ref 70–99)

## 2022-06-29 LAB — CBC
HCT: 24.4 % — ABNORMAL LOW (ref 39.0–52.0)
Hemoglobin: 8.1 g/dL — ABNORMAL LOW (ref 13.0–17.0)
MCH: 27.3 pg (ref 26.0–34.0)
MCHC: 33.2 g/dL (ref 30.0–36.0)
MCV: 82.2 fL (ref 80.0–100.0)
Platelets: 67 10*3/uL — ABNORMAL LOW (ref 150–400)
RBC: 2.97 MIL/uL — ABNORMAL LOW (ref 4.22–5.81)
RDW: 18.4 % — ABNORMAL HIGH (ref 11.5–15.5)
WBC: 22.5 10*3/uL — ABNORMAL HIGH (ref 4.0–10.5)
nRBC: 0 % (ref 0.0–0.2)

## 2022-06-29 LAB — HEPATIC FUNCTION PANEL
ALT: 47 U/L — ABNORMAL HIGH (ref 0–44)
AST: 192 U/L — ABNORMAL HIGH (ref 15–41)
Albumin: 2.7 g/dL — ABNORMAL LOW (ref 3.5–5.0)
Alkaline Phosphatase: 347 U/L — ABNORMAL HIGH (ref 38–126)
Bilirubin, Direct: 1.1 mg/dL — ABNORMAL HIGH (ref 0.0–0.2)
Indirect Bilirubin: 0.6 mg/dL (ref 0.3–0.9)
Total Bilirubin: 1.7 mg/dL — ABNORMAL HIGH (ref 0.3–1.2)
Total Protein: 6.2 g/dL — ABNORMAL LOW (ref 6.5–8.1)

## 2022-06-29 LAB — RENAL FUNCTION PANEL
Albumin: 2.8 g/dL — ABNORMAL LOW (ref 3.5–5.0)
Anion gap: 12 (ref 5–15)
BUN: 85 mg/dL — ABNORMAL HIGH (ref 8–23)
CO2: 24 mmol/L (ref 22–32)
Calcium: 9 mg/dL (ref 8.9–10.3)
Chloride: 98 mmol/L (ref 98–111)
Creatinine, Ser: 5.99 mg/dL — ABNORMAL HIGH (ref 0.61–1.24)
GFR, Estimated: 9 mL/min — ABNORMAL LOW (ref >60)
Glucose, Bld: 217 mg/dL — ABNORMAL HIGH (ref 70–99)
Phosphorus: 4.7 mg/dL — ABNORMAL HIGH (ref 2.5–4.6)
Potassium: 4.3 mmol/L (ref 3.5–5.1)
Sodium: 134 mmol/L — ABNORMAL LOW (ref 135–145)

## 2022-06-29 LAB — CBC WITH DIFFERENTIAL/PLATELET
Abs Immature Granulocytes: 1.04 10*3/uL — ABNORMAL HIGH (ref 0.00–0.07)
Basophils Absolute: 0.1 10*3/uL (ref 0.0–0.1)
Basophils Relative: 0 %
Eosinophils Absolute: 0.2 10*3/uL (ref 0.0–0.5)
Eosinophils Relative: 1 %
HCT: 20.7 % — ABNORMAL LOW (ref 39.0–52.0)
Hemoglobin: 7 g/dL — ABNORMAL LOW (ref 13.0–17.0)
Immature Granulocytes: 4 %
Lymphocytes Relative: 3 %
Lymphs Abs: 0.8 10*3/uL (ref 0.7–4.0)
MCH: 28.5 pg (ref 26.0–34.0)
MCHC: 33.8 g/dL (ref 30.0–36.0)
MCV: 84.1 fL (ref 80.0–100.0)
Monocytes Absolute: 1.6 10*3/uL — ABNORMAL HIGH (ref 0.1–1.0)
Monocytes Relative: 6 %
Neutro Abs: 21.6 10*3/uL — ABNORMAL HIGH (ref 1.7–7.7)
Neutrophils Relative %: 86 %
Platelets: 85 10*3/uL — ABNORMAL LOW (ref 150–400)
RBC: 2.46 MIL/uL — ABNORMAL LOW (ref 4.22–5.81)
RDW: 17.2 % — ABNORMAL HIGH (ref 11.5–15.5)
Smear Review: NORMAL
WBC: 25.2 10*3/uL — ABNORMAL HIGH (ref 4.0–10.5)
nRBC: 0 % (ref 0.0–0.2)

## 2022-06-29 LAB — PREPARE RBC (CROSSMATCH)

## 2022-06-29 LAB — LACTATE DEHYDROGENASE: LDH: 328 U/L — ABNORMAL HIGH (ref 98–192)

## 2022-06-29 MED ORDER — HEPARIN SODIUM (PORCINE) 1000 UNIT/ML IJ SOLN
INTRAMUSCULAR | Status: AC
  Filled 2022-06-29: qty 10

## 2022-06-29 MED ORDER — GUAIFENESIN-DM 100-10 MG/5ML PO SYRP: 15.0000 mL | ORAL_SOLUTION | ORAL | Status: DC | PRN

## 2022-06-29 MED ORDER — TRACE MINERALS CU-MN-SE-ZN 300-55-60-3000 MCG/ML IV SOLN
INTRAVENOUS | Status: DC
  Filled 2022-06-29: qty 806.4

## 2022-06-29 MED ORDER — HYDROXYZINE HCL 10 MG PO TABS
10.0000 mg | ORAL_TABLET | Freq: Three times a day (TID) | ORAL | Status: DC | PRN
Start: 2022-06-29 — End: 2022-06-29

## 2022-06-29 MED ORDER — RENA-VITE PO TABS: 1.0000 | ORAL_TABLET | Freq: Every day | ORAL | Status: DC

## 2022-06-29 MED ORDER — ACETAMINOPHEN 160 MG/5ML PO SOLN
650.0000 mg | Freq: Four times a day (QID) | ORAL | Status: DC | PRN
Start: 2022-06-29 — End: 2022-06-29

## 2022-06-29 MED ORDER — SODIUM CHLORIDE 0.9% IV SOLUTION
Freq: Once | INTRAVENOUS | Status: AC
Start: 2022-06-29 — End: 2022-06-29

## 2022-06-29 MED ORDER — QUETIAPINE FUMARATE 25 MG PO TABS
12.5000 mg | ORAL_TABLET | Freq: Every evening | ORAL | Status: DC | PRN
Start: 2022-06-29 — End: 2022-06-29

## 2022-06-29 NOTE — Progress Notes (Signed)
Xavier Esparza , MD [address], [place], [place], [place], [address] [address] [place], [place], [place], [address] Phone: [phone]  Fax: [phone]   Xavier Esparza is being followed for GI bleed   Subjective: Contacted by Dr Reesa Chew earlier today , black stool with drop in HB  Patient drowsy but denies any complaints. On a blood transfusion    Objective: Vital signs in last 24 hours: Vitals:   06/29/22 1235 06/29/22 1238 06/29/22 1242 06/29/22 1245  BP:  123/63    Pulse: 73 76  78  Resp: (!) 31 (!) 30  (!) 30  Temp:      TempSrc:      SpO2: 98% 98%  98%  Weight:   107.6 kg   Height:       Weight change: 5.2 kg  Intake/Output Summary (Last 24 hours) at 06/29/2022 1258 Last data filed at 06/29/2022 1200 Gross per 24 hour  Intake 3066.85 ml  Output 399 ml  Net 2667.85 ml     Exam: Heart:: Regular rate and rhythm or S1S2 present Lungs: normal Abdomen: soft, distended , nontender, normal bowel sounds   Lab Results: '@LABTEST2'$ @ Micro Results: No results found for this or any previous visit (from the past 240 hour(s)). Studies/Results: DG Abd 1 View  Result Date: 06/28/2022 CLINICAL DATA:  Nasogastric placement EXAM: ABDOMEN - 1 VIEW COMPARISON:  Earlier same day FINDINGS: Nasogastric tube tip appears the same, with the tip in the mid body of the stomach. IMPRESSION: No apparent change.  Nasogastric tip in the mid body of the stomach. Electronically Signed   By: Nelson Chimes M.D.   On: 06/28/2022 15:30   DG Abd 1 View  Result Date: 06/28/2022 CLINICAL DATA:  Abdominal distention, nasogastric tube placement. EXAM: ABDOMEN - 1 VIEW COMPARISON:  Abdominal radiograph 06/24/2022. FINDINGS: Nasogastric tube tip and side hole project at the level of the gastric body. Mild gaseous distention of the stomach. Scattered air throughout the small bowel and colon with improvement of the gaseous distention noted on 06/24/2022. Brachytherapy seeds noted at the prostate. No free  intraperitoneal air on these supine images. IMPRESSION: 1. Nasogastric tube tip and side hole project at the level of the gastric body. 2. Interval improvement of gaseous distention of the small bowel loops seen on prior radiograph 06/24/2022. Electronically Signed   By: Ileana Roup M.D.   On: 06/28/2022 11:06   Medications: I have reviewed the patient's current medications. Scheduled Meds:  sodium chloride   Intravenous Once   Chlorhexidine Gluconate Cloth  6 each Topical Q0600   heparin injection (subcutaneous)  5,000 Units Subcutaneous Q8H   heparin sodium (porcine)       insulin aspart  0-15 Units Subcutaneous Q4H   insulin glargine-yfgn  5 Units Subcutaneous BID   multivitamin  1 tablet Per Tube QHS   mouth rinse  15 mL Mouth Rinse 4 times per day   pantoprazole  40 mg Intravenous Q12H   sodium chloride flush  10-40 mL Intracatheter Q12H   Continuous Infusions:  sodium chloride Stopped (06/25/22 1829)   fluconazole (DIFLUCAN) IV Stopped (06/28/22 2137)   meropenem (MERREM) IV Stopped (06/28/22 1822)   TPN ADULT (ION) 70 mL/hr at 06/29/22 1200   TPN ADULT (ION)     PRN Meds:.sodium chloride, acetaminophen (TYLENOL) oral liquid 160 mg/5 mL, acetaminophen **OR** acetaminophen, alteplase, guaiFENesin-dextromethorphan, haloperidol lactate, heparin, heparin sodium (porcine), HYDROmorphone (DILAUDID) injection, hydrOXYzine, iohexol, ipratropium-albuterol, lidocaine (PF), lidocaine-prilocaine, lip balm, ondansetron (ZOFRAN)  IV, mouth rinse, oxyCODONE, pentafluoroprop-tetrafluoroeth, QUEtiapine, sodium chloride flush   Assessment: Principal Problem:   Acute necrotizing pancreatitis Active Problems:   Diabetes mellitus without complication (HCC)   BPH (benign prostatic hyperplasia)   Nephrolithiasis   Portal vein thrombosis   AKI (acute kidney injury) ([place])   Severe sepsis (HCC)   Syncope   History of prostate cancer   Upper GI bleed   Acute blood loss anemia   Acute metabolic  encephalopathy  Xavier Esparza 76 y.o. male with severe necrotizing pancreatitis , AKI, GI bleed with EGD on 06/18/2022 showing duodenal ulcers . Ver complicated stay , last CT on 06/26/2022 shows worsening of pancreatits, concerns for omenatal lesions concerning for malignancy , pleural effusions. I believe family planning to take to [place] later today . Explained to patient that if has further black stools and Hb drops then will plan for EGD tomorrow. Continue PPI, monitor CBC, unable to do CT angiogram due to renal function.    LOS: 21 days   Xavier Bellows, MD 06/29/2022, 12:58 PM

## 2022-06-29 NOTE — Progress Notes (Signed)
Progress Note   Patient: Xavier Esparza YJE:[id number] DOB: [birth year] DOA: 06/08/2022     21 DOS: the patient was seen and examined on 06/29/2022   Brief hospital course: 76 y.o. male with medical history significant for DM, BPH, history of prostate cancer, kidney stones status post lithotripsy, negative colonoscopy 2022 (3 benign polyps), who presents to the ED by EMS following a syncopal episode at home while walking to the bathroom.  Wife said she heard a loud thud and went to find him on the floor.  She said he was awake when she got there but he could not help himself up and she called 911.  Patient states he was in his usual state of health but about 2 weeks prior he saw a little blood in his urine similar to when he had his kidney stone.  He saw his urologist yesterday who gave him a prescription for Levaquin.  He otherwise denied feeling unwell previous to the episode.  He denied chest pain, shortness of breath, headache or visual disturbance, one-sided numbness weakness or tingling, nausea or vomiting, abdominal pain, urinary frequency or burning or diarrhea.[facility] reported BP 76/45 and he was treated with IM epinephrine and a 400 mL fluid bolus.  He was awake and alert x4 by arrival.  After his arrival in the emergency room he developed abdominal pain and vomiting.   Initial CT abdomen pelvis with moderate acute pancreatitis no evidence of pancreatic necrosis or pseudocyst.  Follow-up MRI abdomen concerning for possible choledocholithiasis with evidence of necrotizing pancreatitis.  Case discussed with GI Dr. Marius Ditch at [facility].   There was mention of possible portal vein thrombosis.  Heparin GTT was initially started however patient developed suspected acute hematuria so heparin GTT was stopped.  VQ scan ordered and perfusion component will be completed prior to patient transfer.  6/15: Clinical status overall unchanged.  Patient remains with abdominal pain associated with nausea.  Hemodynamically  stable.  Mild elevation in white count.  Lipase and LFTs have improved over interval.  Discussed case with GI and general surgery.  Likely that patient's presentation is secondary to necrotizing pancreatitis.  Currently as there is no CBD stone and there is no strong indication for transfer for ERCP.  From a surgical standpoint no intervention is recommended.  There is nothing from an interventional radiology standpoint to drain.  As such we will discontinue transfer and keep patient in [facility].   6/16: No appreciable change in clinical status.  LFTs are downtrending.  White count slightly downtrending.  No fevers noted over interval.  Increasing abdominal distention noted.  Ascites and peripancreatic fluid seen on repeat CAT scan today.  Discussed case with PCCM, nephrology, GI.  Will attempt postpyloric feedings.  Kidney function continues to deteriorate.  Nephrology will initiate hemodialysis today.   6/17: No change in clinical status.  White count remains minimally elevated.  No fevers over interval.  Remains with abdominal distention.  Started HD yesterday.  We will run another round of HD today.   6/18: No appreciable clinical changes.  White blood cell count slightly downtrending.  Creatinine worsened despite 2 rounds of hemodialysis.  Remains with hematuria despite stopping heparin 2 days ago.   6/19: Increasing abd distention.  KUB consistent with ileus.  [place] negative for ascites   6/20: Postpyloric Dobbhoff discontinued.  NGT placed.  Tip in stomach. Set to low intermittent suction.  Rectal tube in place.  Abdominal distention improved.  White blood cell count increased to 22  For the week of 6/21 through 06/22/2022:  the patient had an upper GI bleed and was transfused 3 units of packed red blood cells.  He still remains anemic.  EGD showing gastric ulcers.  Patient was initially on Protonix drip now on Protonix twice a day.  Last hemoglobin 7.3.  CRRT was started on 06/17/2022 and continuing  as per nephrology.  The patient is still anuric.  The patient severe necrotizing pancreatitis.  Repeat CT scan showed increased nodularity in the omentum.  No discrete abscess about the pancreas.  Did see possible liver abscess.  Antibiotics were changed over to meropenem and Diflucan.  Repeat blood cultures on 06/18/2022 are negative.  The patient did have an ileus on x-ray so tube feeds were stopped.  Patient is on TPN and has a PICC line.  The patient's mental status has waxed and waned during the week and CT scan of the head was negative.  6/28: Patient continued to experience abdominal pain.  Nephrology is planning to continue CRRT for now.  Continue to have NG tube but secretions are decreasing.  Remained on TPN.  6/29: Clinically seems stable.  Continue to require pain medications.  Nephrology is going to start him on intermittent dialysis and stopping CRRT.  Also trying trickle feeds through NG tube as NG tube secretions remained minimal. Hypophosphatemia which is being repleted. Anemia panel consistent with anemia of chronic disease with some iron deficiency. Ferritin levels elevated. We will start him on iron supplement once tube feed established.  6/30: Patient with very slow improvement.  Able to tolerate trickle food, rate was increased today. Started on intermittent dialysis.  Going for permanent HD catheter today.  7/1: Patient continued to have low-grade fever at 100.  Had his HD catheter placed by vascular surgery yesterday.  Hemoglobin dropped to 6.2 this morning, getting 1 unit of PRBC. Ordered repeat CT abdomen due to persistent low-grade fever and leukocytosis. NG tube was pulled out by patient, will try p.o. intake before replacing.  7/2: Patient becomes more confused and agitated overnight.  Significant elevation of BUN to 103 and creatinine of 7.21, last dialysis was on Friday.  Tolerating some p.o. intake, remained on TPN. CT abdomen was completed last night and shows as  below. 1. Findings consistent with marked severity acute necrotizing pancreatitis, with interval increase in peripancreatic fluid since the prior study. 2. Interval development of marked severity enteritis involving multiple loops of distal small bowel. 3. Extensive omental nodularity and thickening throughout the abdomen and pelvis, concerning for the presence of metastatic disease. 4. 6 mm nonobstructing right renal calculus. 5. Colonic diverticulosis. 6. Moderate to marked severity bibasilar consolidation with a small right pleural effusion. 7. Multiple cystic appearing areas scattered throughout the liver parenchyma, consistent with hepatic cysts. 8. Stable heterogeneous density within the right hepatic lobe which corresponds to a prior region of portal vein thrombosis and suspected micro abscesses. 9. Small hiatal hernia. 10. Interval removal of the nasogastric tube and Foley catheter seen on the prior study. 11. Aortic atherosclerosis. Patient has an history of prostate cancer, checking PSA for concern of omental nodularity.  7/3: Patient was becoming more lethargic and somnolent with markedly elevated BUN at 140.  Denies any pain but abdomen seems more distended so KUB was obtained which was without any significant abnormality.  NG tube was placed for suctioning which resulted in improvement in abdominal distention.  Wife requested for [facility] transfer stating that Dr. Tillie Rung has accepted him and he can  go to [facility] now.  Contacted [facility] transfer center and apparently she is a GI provider and does not have admitting privileges.  Patient has to go either through medical or surgical services, request was made again and had a long discussion with their internal medicine service but they declined the transfer stating that nothing much can be added medically for him. Their surgery service wants a surgeon to surgeon call to discuss about any surgical possibility. Going for dialysis  today. Another unit of PRBC was ordered today.  7/4: Patient remained quite lethargic, some improvement as compared to yesterday after getting dialysis.  Hemoglobin increased to 8 after transfusion and then decreased to 7 this morning.  Melanotic stools in the bag.  Markedly distended abdomen with diffuse tenderness. Trickle feed was stopped and NG tube was placed on suctioning. General surgery was reconsulted and they tried reaching someone at [facility] surgery as patient will require a very specialized procedure if possible which is beyond the scope of current facility. Per Dr. Hampton Abbot he tried multiple times and could not reach anyone. GI was again consulted today.  Ordered 2 more unit of PRBC.  Plan another EGD for tomorrow. Family very frustrated with the current situation and our inability to transfer him to a tertiary care center despite multiple attempts.  They are considering transferring him to [facility] ED privately either via personal transportation or by hiring a private ambulance. Had a long discussion with daughter and wife.  Patient is hemodynamically stable but is very high risk for deterioration and death.  He is critically ill.  We we will discharge him if that is what they want to do, no final decision yet.   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  With low-grade fever on 6/23 and I repeated blood cultures which are negative for 3 days.  Repeat CT scan does not show any abscess on the pancreas but does show fluid collection still in the liver which could be an abscess.  Empirically on meropenem and Diflucan.  Tumor markers ordered including CEA normal range at 0.8 and normal alpha-fetoprotein at less than 1.8.  CA 19-9 within normal range currently on TPN.   Patient is critically ill.  Appreciate palliative care consultation. After accidental removal of NG tube patient is tolerating some p.o.  intake. Repeat CT abdomen with some increase in peripancreatic fluid, there was some new omental nodularity concerning for metastatic disease noted.  Tumor markers were negative but he also has an history of prostate cancer. -Check PSA-within normal limit -Continue antibiotics -NG tube was replaced due to worsening abdominal distention, as KUB was without any significant abnormality, we can try trickle feed again. -Continue with TPN -Trickle feed was discontinued today and NG tube was placed on suctioning due to worsening distention and concern of GI bleed.  Acute blood loss anemia Last hemoglobin 8>>7.  Most likely having another GI bleed as stool looked melanotic. Patient received total of 5 units of packed red blood cells during the hospital course.  Had GI bleed on 06/16/2022.  Having some blood loss with the continuous dialysis if the filter gets clotted and unable to return blood. Anemia panel consistent with anemia of chronic disease with some iron deficiency. -Ordered 2 more unit PRBC to be given during dialysis today. -GI was reconsulted -Will start him on iron supplement once continue to tolerate p.o. intake.   Upper GI bleed Looks like having another GI bleed  with melanotic stools. GI was reconsulted and they are planning a repeat EGD tomorrow morning. Initial GI bleed was secondary to duodenal ulcers.  No active bleeding on EGD.  Monitor closely with heparin with dialysis.   -Continue Protonix IV twice daily  Severe sepsis ([place]) Present on admission secondary to severe necrotizing pancreatitis.  Empirically on meropenem and Diflucan.  Repeat blood cultures negative.  Acute metabolic encephalopathy Mental status waxes and wanes..  Will give as needed low-dose Seroquel at night. Need to continue pain medications.  AKI (acute kidney injury) [facility]) Patient was started on dialysis for AKI and anuria on 6/16. Received CRRT from 6/22 - 6/29. Permanent HD catheter was placed by  vascular surgery on 6/30 and he was started on intermittent dialysis. Significant increase in BUN and creatinine .  Patient remained anuric.  Last dialysis was on Monday Nephrology is on board-will be getting another dialysis today.  Portal vein thrombosis Unable to do anticoagulation with drop in hemoglobin and upper GI bleed.  Syncope Secondary to hypotension   Nephrolithiasis Nonobstructing right renal stone.  No acute issues suspected  History of prostate cancer Repeat CT abdomen with concern of omental metastatic disease. -Check PSA  BPH (benign prostatic hyperplasia) No acute issues  Diabetes mellitus without complication (HCC) Sugars elevated while on TPN.  -Add Semglee 5 units twice daily -Continue with SSI     Subjective: Patient was seen and examined today.  Appears quite lethargic but denies any complaints.  Daughter and wife at bedside.  Physical Exam: Vitals:   06/29/22 1335 06/29/22 1345 06/29/22 1400 06/29/22 1415  BP:  (!) 97/57 117/60 112/63  Pulse: 75 73 77 78  Resp: (!) 30 (!) 24 (!) 32 (!) 34  Temp: 98.3 F (36.8 C)     TempSrc: Axillary     SpO2: 95% 96% (!) 81% 92%  Weight:      Height:       General.  Chronically ill and lethargic gentleman, in no acute distress.  NG tube in place Pulmonary.  Lungs clear bilaterally, normal respiratory effort. CV.  Regular rate and rhythm, no JVD, rub or murmur. Abdomen.  Distended with mild diffuse tenderness, bowel sounds hypoactive.  Melanotic stools in the back CNS.  Alert and oriented .  No focal neurologic deficit. Extremities.  No edema, no cyanosis, pulses intact and symmetrical. Psychiatry.  Judgment and insight appears impaired  Data Reviewed: Prior data reviewed  Family Communication: Discussed with daughter and wife  Disposition: Status is: Inpatient Remains inpatient appropriate because: Severity of illness   Planned Discharge Destination: To be determined  Time spent: 55  minutes  This record has been created using Systems analyst. Errors have been sought and corrected,but may not always be located. Such creation errors do not reflect on the standard of care.  Author: Lorella Nimrod, MD 06/29/2022 2:26 PM  For on call review [email or web address].

## 2022-06-29 NOTE — Assessment & Plan Note (Signed)
Present on admission secondary to severe necrotizing pancreatitis.  Empirically on meropenem and Diflucan.  Repeat blood cultures negative.

## 2022-06-29 NOTE — Progress Notes (Signed)
Treatment Date: 06/29/2022   Treatment Time: 3 hours   Access: Right chest CVC   UF Removed: -400 ml   Next Treatment: 07/01/2022   Note:  HD treatment completed. Tolerated well, no adverse effects noted or reported. Patient received 1 unit of PRBC during dialysis treatment. No adverse effects noted or reported. Patient hemodynamically stable. Report given to floor nurse Lolita Cram, RN.

## 2022-06-29 NOTE — Assessment & Plan Note (Signed)
Last hemoglobin 8>>7.  Most likely having another GI bleed as stool looked melanotic. Patient received total of 5 units of packed red blood cells during the hospital course.  Had GI bleed on 06/16/2022.  Having some blood loss with the continuous dialysis if the filter gets clotted and unable to return blood. Anemia panel consistent with anemia of chronic disease with some iron deficiency. -Ordered 2 more unit PRBC to be given during dialysis today. -GI was reconsulted -Will start him on iron supplement once continue to tolerate p.o. intake.

## 2022-06-29 NOTE — Progress Notes (Signed)
PHARMACY - TOTAL PARENTERAL NUTRITION CONSULT NOTE   Indication: without adequate nutrition for > 7 days / ileus  Patient Measurements: Height: 6' (182.9 cm) Weight: 105.2 kg (231 lb 14.8 oz) IBW/kg (Calculated) : 77.6 TPN AdjBW (KG): 83.7 Body mass index is 31.45 kg/m.  Assessment: 76 y/o male with h/o DM, BPH, prostate cancer, hiatal hernia, kidney stones and HTN who is admitted with severe necrotizing pancreatitis, portal vein thrombosis, liver lesions, AKI and sepsis.  Glucose / Insulin: BG 142 - 203 (19 units SSI required). Moderate SSI + 20u insulin in TPN + Semglee 5u BID Electrolytes: Mild hyponatremia. Hyperphosphatemia Renal: CRRT stopped 6/28 PM and transitioned to iHD (first HD 6/29). Next session planned for 7/3. Hepatic: Transaminitis resolved. LFTs within normal limits Intake / Output; MIVF: + 6.3L for the admission GI Imaging: 06/21/22 Abd Xray shows diffuse gaseous distention of bowel compatible with ileus GI Surgeries / Procedures: none recent  Central access: 06/17/22  TPN start date: 06/17/22   Nutritional Goals: Goal TPN rate is 70 mL/hr (provides 121 g of protein and 2241 kcals per day)  RD Assessment: Estimated Needs Total Energy Estimated Needs: 2200-2500kcal/day Total Protein Estimated Needs: 120-135g/day Total Fluid Estimated Needs: 2.1-2.4Lday  Current Nutrition:  Thin liquids Boost Breeze po TID  Plan:  Continue TPN at 70 mL/hr --Changes made to decrease total volume provided via TPN given anuric requiring HD to avoid fluid overload Nutritional components: Amino acids (using 15% Clinisol): 121 g Dextrose: 319 g Lipids (using 20% SMOFlipids): 67 g kCal 2241 Volume: 1680 mL (1780 mL with overfill) Electrolytes in TPN: Na 10mq/L, K 217m/L, Ca 83m42mL, Mg 1mE96m, and Phos 1 mmol/L. Cl:Ac 1:1 Defer management of hyperphosphatemia to nephrology Add standard MVI and trace elements to TPN Continue moderate q4h SSI and adjust as needed  Continue 20u  insulin in TPN per diabetes coordinator recommendations Semglee 5u BID added per TRH Defer further insulin adjustment to TRH. Patient does have limited enteral intake and including feeding supplements. Glucose was fairly well controlled on 20u insulin in TPN prior to these additions Monitor TPN labs on Mon/Thurs, daily until stable  [name]/2023,7:02 AM

## 2022-06-29 NOTE — Assessment & Plan Note (Signed)
Unable to do anticoagulation with drop in hemoglobin and upper GI bleed.

## 2022-06-29 NOTE — Progress Notes (Signed)
   06/29/22 1000  Clinical Encounter Type  Visited With Patient and family together  Visit Type Initial  Referral From Nurse  Consult/Referral To [place] responded to request to provide comfort and prayer for patient and family. Patient responded to prayer with a brief conversation with Chaplain. However the patient tired quickly.

## 2022-06-29 NOTE — Assessment & Plan Note (Signed)
Looks like having another GI bleed with melanotic stools. GI was reconsulted and they are planning a repeat EGD tomorrow morning. Initial GI bleed was secondary to duodenal ulcers.  No active bleeding on EGD.  Monitor closely with heparin with dialysis.   -Continue Protonix IV twice daily

## 2022-06-29 NOTE — Assessment & Plan Note (Signed)
Patient was started on dialysis for AKI and anuria on 6/16. Received CRRT from 6/22 - 6/29. Permanent HD catheter was placed by vascular surgery on 6/30 and he was started on intermittent dialysis. Significant increase in BUN and creatinine .  Patient remained anuric.  Last dialysis was on Monday Nephrology is on board-will be getting another dialysis today.

## 2022-06-29 NOTE — Progress Notes (Signed)
06/29/2022  Subjective: Discussed case with Dr. Reesa Chew.  The patient has now been here for 3 weeks with very severe necrotizing pancreatitis which has resulted in renal failure, portal vein thrombosis with concern for microabscesses in the right hepatic lobe, worsening peripancreatic fluid size, delirium, inability to tolerate po intake or tube feeds consistently due to ileus, GI bleed from possible duodenal ulcers noted on EGD.  The primary team has attempted transfer to tertiary center before without success.  More recently, the patient has transitioned from CRRT to intermittent HD.  He needed NG placement again after pulling his NG due to delirium given worsening abdominal distention.  He is also requiring pRBC transfusion due to anemia, likely of now chronic disease.  Vital signs: Temp:  [98 F (36.7 C)-100.7 F (38.2 C)] 98.4 F (36.9 C) (07/04 1221) Pulse Rate:  [68-93] 73 (07/04 1221) Resp:  [15-34] 30 (07/04 1221) BP: (88-147)/(56-84) 105/64 (07/04 1200) SpO2:  [88 %-100 %] 95 % (07/04 1221) FiO2 (%):  [40 %] 40 % (07/04 0701) Weight:  [105.2 kg-107.2 kg] 105.2 kg (07/04 0500)   Intake/Output: 07/03 0701 - 07/04 0700 In: 3550.9 [I.V.:2299; Blood:330; NG/GT:412.7; IV Piggyback:509.2] Out: 399  Last BM Date : 06/28/22  Physical Exam: Constitutional:  No acute distress, somewhat somnolent but able to answer questions. Abdomen:  soft, distended, non-tender to palpation.  NG in place with prior tube feeds in canister. Extremities:  peripheral edema in all extremities.  Labs:  Recent Labs    06/28/22 0333 06/28/22 2217 06/29/22 0500  WBC 25.2*  --  25.2*  HGB 6.8* 8.0* 7.0*  HCT 21.1* 23.6* 20.7*  PLT 107*  --  85*   Recent Labs    06/28/22 0741 06/29/22 0500  NA 132* 134*  K 4.8 4.3  CL 101 98  CO2 19* 24  GLUCOSE 203* 217*  BUN 140* 85*  CREATININE 8.84* 5.99*  CALCIUM 9.4 9.0   No results for input(s): "LABPROT", "INR" in the last 72 hours.  Imaging: DG Abd 1  View  Result Date: 06/28/2022 CLINICAL DATA:  Nasogastric placement EXAM: ABDOMEN - 1 VIEW COMPARISON:  Earlier same day FINDINGS: Nasogastric tube tip appears the same, with the tip in the mid body of the stomach. IMPRESSION: No apparent change.  Nasogastric tip in the mid body of the stomach. Electronically Signed   By: Nelson Chimes M.D.   On: 06/28/2022 15:30    Assessment/Plan: This is a 76 y.o. male with very severe necrotizing pancreatitis.  --Discussed with the family that at this point there is no clearcut indication for surgical intervention.  He does have extensive necrotizing pancreatitis and increasing size of peripancreatic fluid, but unclear if this is infected.  Prior blood cultures were negative, and he remains on antibiotics.  Discussed that if he were to need surgery, it is something beyond our capabilities at [facility] and he would need transfer.  The main reason GI and Surgery have recommended transferring in the past is that the management of necrotizing pancreatitis can be quite complex and requires a multidisciplinary approach.  I still think this is best managed at a tertiary center, particularly with some of the worsening features.  Apparently the family had discussed the patient's case with Dr. Mont Dutton with GI at [facility], but GI does not have admitting privileges and the medical team has declined.  I'm not sure that a surgical team would accept without a clear indication for surgical intervention. --The family has told me that they have  hired a Research officer, political party to take him to [facility].  Discussed with them that I cannot recommend private transfer, but understand their frustration.  Currently he is not requiring any pressors, is on TPN, and intermittent HD, and O2 supplementation via nasal canula. --Surgical team is available if any questions.   I spent 50 minutes dedicated to the care of this patient on the date of this encounter to include pre-visit review of records, face-to-face time  with the patient discussing diagnosis and management, and any post-visit coordination of care.  Melvyn Neth, [facility]

## 2022-06-29 NOTE — Progress Notes (Signed)
[facility]  ROUNDING NOTE   Subjective:   Patient seen and evaluated at bedside in CCU. Remains critically ill.   Abdomen distended.   NG in place.   Objective:  Vital signs in last 24 hours:  Temp:  [98 F (36.7 C)-100.7 F (38.2 C)] 98.3 F (36.8 C) (07/04 1335) Pulse Rate:  [68-93] 75 (07/04 1335) Resp:  [15-34] 30 (07/04 1335) BP: (88-147)/(54-84) 104/62 (07/04 1330) SpO2:  [88 %-100 %] 95 % (07/04 1335) FiO2 (%):  [40 %] 40 % (07/04 0701) Weight:  [105.2 kg-107.6 kg] 107.6 kg (07/04 1242)  Weight change: 5.2 kg Filed Weights   06/29/22 0041 06/29/22 0500 06/29/22 1242  Weight: 106.9 kg 105.2 kg 107.6 kg    Intake/Output: I/O last 3 completed shifts: In: 2595 [P.O.:240; I.V.:2517.1; Blood:330; Other:30; NG/GT:412.7; IV Piggyback:509.2] Out: 419 [Other:399; Stool:20]   Intake/Output this shift:  Total I/O In: 502.4 [I.V.:417.4; NG/GT:85] Out: -   Physical Exam: General: Critically ill-appearing  Head: Normocephalic, atraumatic. NG in place  Eyes: Anicteric  Lungs:  Diminished in bases, tachypnea, [place] O2  Heart: Regular rate and rhythm  Abdomen:  Firm, distended,  tenderness  Extremities: Trace peripheral edema.  Neurologic: Awake, alert  Skin: Warm, dry  Access: Right IJ PermCath 6/38/7564    Basic Metabolic Panel: Recent Labs  Lab 06/24/22 0613 06/24/22 1353 06/25/22 0531 06/26/22 0501 06/27/22 0533 06/27/22 0722 06/28/22 0741 06/29/22 0500  NA 138   < > 137 135  --  135 132* 134*  K 4.2   < > 3.9 4.0  --  4.2 4.8 4.3  CL 107   < > 103 101  --  100 101 98  CO2 27   < > 24 25  --  22 19* 24  GLUCOSE 189*   < > 184* 201*  --  193* 203* 217*  BUN 51*   < > 71* 72*  --  103* 140* 85*  CREATININE 2.62*   < > 4.00* 4.65*  --  7.21* 8.84* 5.99*  CALCIUM 8.2*   < > 8.5* 8.3*  --  8.9 9.4 9.0  MG 2.4  --  2.0 2.1 2.5*  --  2.3  --   PHOS 1.4*   < > 4.1 4.4  --  5.9* 6.9* 4.7*   < > = values in this interval not displayed.     Liver  Function Tests: Recent Labs  Lab 06/24/22 3329 06/24/22 1949 06/25/22 0531 06/26/22 0501 06/27/22 0722 06/28/22 0741 06/29/22 0500  AST 38  --   --   --   --  43* 192*  ALT 18  --   --   --   --  17 47*  ALKPHOS 98  --   --   --   --  121 347*  BILITOT 0.4  --   --   --   --  0.9 1.7*  PROT 5.9*  --   --   --   --  6.2* 6.2*  ALBUMIN 2.3*   < > 2.4* 2.6* 2.7* 2.7* 2.7*  2.8*   < > = values in this interval not displayed.    No results for input(s): "LIPASE", "AMYLASE" in the last 168 hours. No results for input(s): "AMMONIA" in the last 168 hours.    CBC: Recent Labs  Lab 06/25/22 0531 06/26/22 0501 06/26/22 1616 06/27/22 0533 06/28/22 0333 06/28/22 2217 06/29/22 0500  WBC 25.7* 23.1*  --  21.4* 25.2*  --  25.2*  NEUTROABS 21.2* 18.6*  --  18.0* 22.1*  --  21.6*  HGB 7.1* 6.2* 7.4* 7.0* 6.8* 8.0* 7.0*  HCT 21.3* 18.7* 22.2* 20.3* 21.1* 23.6* 20.7*  MCV 84.9 84.2  --  92.7 95.9  --  84.1  PLT 179 126*  --  132* 107*  --  85*     Cardiac Enzymes: No results for input(s): "CKTOTAL", "CKMB", "CKMBINDEX", "TROPONINI" in the last 168 hours.   BNP: Invalid input(s): "POCBNP"  CBG: Recent Labs  Lab 06/28/22 1915 06/28/22 2321 06/29/22 0318 06/29/22 0756 06/29/22 1205  GLUCAP 191* 142* 185* 203* 230*     Microbiology: Results for orders placed or performed during the hospital encounter of 06/08/22  Blood culture (routine x 2)     Status: None   Collection Time: 06/08/22  1:33 PM   Specimen: BLOOD  Result Value Ref Range Status   Specimen Description BLOOD LEFT HAND  Final   Special Requests   Final    BOTTLES DRAWN AEROBIC AND ANAEROBIC Blood Culture results may not be optimal due to an inadequate volume of blood received in culture bottles   Culture   Final    NO GROWTH 5 DAYS Performed at [facility], [address] [place], [place] [address]    Report Status 06/13/2022 FINAL  Final  Blood culture (routine x 2)     Status: None    Collection Time: 06/08/22  2:15 PM   Specimen: BLOOD  Result Value Ref Range Status   Specimen Description BLOOD BLOOD LEFT WRIST  Final   Special Requests   Final    BOTTLES DRAWN AEROBIC AND ANAEROBIC Blood Culture results may not be optimal due to an inadequate volume of blood received in culture bottles   Culture   Final    NO GROWTH 5 DAYS Performed at [facility], [address], [place], [place] [address]    Report Status 06/13/2022 FINAL  Final  MRSA Next Gen by PCR, Nasal     Status: None   Collection Time: 06/10/22  4:14 PM   Specimen: Nasal Mucosa; Nasal Swab  Result Value Ref Range Status   MRSA by PCR Next Gen NOT DETECTED NOT DETECTED Final    Comment: (NOTE) The GeneXpert MRSA Assay (FDA approved for NASAL specimens only), is one component of a comprehensive MRSA colonization surveillance program. It is not intended to diagnose MRSA infection nor to guide or monitor treatment for MRSA infections. Test performance is not FDA approved in patients less than 21 years old. Performed at [facility], [address] [place], [place] [address]   SARS Coronavirus 2 by RT PCR (hospital order, performed in [facility] hospital lab) *cepheid single result test* Anterior Nasal Swab     Status: None   Collection Time: 06/10/22  5:17 PM   Specimen: Anterior Nasal Swab  Result Value Ref Range Status   SARS Coronavirus 2 by RT PCR NEGATIVE NEGATIVE Final    Comment: (NOTE) SARS-CoV-2 target nucleic acids are NOT DETECTED.  The SARS-CoV-2 RNA is generally detectable in upper and lower respiratory specimens during the acute phase of infection. The lowest concentration of SARS-CoV-2 viral copies this assay can detect is 250 copies / mL. A negative result does not preclude SARS-CoV-2 infection and should not be used as the sole basis for treatment or other patient management decisions.  A negative result may occur with improper specimen collection / handling,  submission of specimen other than nasopharyngeal  swab, presence of viral mutation(s) within the areas targeted by this assay, and inadequate number of viral copies (<250 copies / mL). A negative result must be combined with clinical observations, patient history, and epidemiological information.  Fact Sheet for Patients:   https://www.patel.info/  Fact Sheet for Healthcare Providers: https://hall.com/  This test is not yet approved or  cleared by the [place] FDA and has been authorized for detection and/or diagnosis of SARS-CoV-2 by FDA under an Emergency Use Authorization (EUA).  This EUA will remain in effect (meaning this test can be used) for the duration of the COVID-19 declaration under Section 564(b)(1) of the Act, 21 U.S.C. section 360bbb-3(b)(1), unless the authorization is terminated or revoked sooner.  Performed at [facility], [address] [place], [place] [address]   Culture, blood (Routine X 2) w Reflex to ID Panel     Status: None   Collection Time: 06/18/22 10:03 AM   Specimen: BLOOD  Result Value Ref Range Status   Specimen Description BLOOD RIGHT HAND  Final   Special Requests   Final    BOTTLES DRAWN AEROBIC ONLY Blood Culture adequate volume   Culture   Final    NO GROWTH 5 DAYS Performed at [facility], [address] [place], [place] [address]    Report Status 06/23/2022 FINAL  Final  Culture, blood (Routine X 2) w Reflex to ID Panel     Status: None   Collection Time: 06/18/22 10:20 AM   Specimen: BLOOD  Result Value Ref Range Status   Specimen Description BLOOD LEFT [facility]  Final   Special Requests   Final    BOTTLES DRAWN AEROBIC AND ANAEROBIC Blood Culture adequate volume   Culture   Final    NO GROWTH 5 DAYS Performed at [facility], [address] [place], [place] [address]    Report Status 06/23/2022 FINAL  Final    Coagulation Studies: No results for  input(s): "LABPROT", "INR" in the last 72 hours.   Urinalysis: No results for input(s): "COLORURINE", "LABSPEC", "PHURINE", "GLUCOSEU", "HGBUR", "BILIRUBINUR", "KETONESUR", "PROTEINUR", "UROBILINOGEN", "NITRITE", "LEUKOCYTESUR" in the last 72 hours.  Invalid input(s): "APPERANCEUR"     Imaging: DG Abd 1 View  Result Date: 06/28/2022 CLINICAL DATA:  Nasogastric placement EXAM: ABDOMEN - 1 VIEW COMPARISON:  Earlier same day FINDINGS: Nasogastric tube tip appears the same, with the tip in the mid body of the stomach. IMPRESSION: No apparent change.  Nasogastric tip in the mid body of the stomach. Electronically Signed   By: Nelson Chimes M.D.   On: 06/28/2022 15:30   DG Abd 1 View  Result Date: 06/28/2022 CLINICAL DATA:  Abdominal distention, nasogastric tube placement. EXAM: ABDOMEN - 1 VIEW COMPARISON:  Abdominal radiograph 06/24/2022. FINDINGS: Nasogastric tube tip and side hole project at the level of the gastric body. Mild gaseous distention of the stomach. Scattered air throughout the small bowel and colon with improvement of the gaseous distention noted on 06/24/2022. Brachytherapy seeds noted at the prostate. No free intraperitoneal air on these supine images. IMPRESSION: 1. Nasogastric tube tip and side hole project at the level of the gastric body. 2. Interval improvement of gaseous distention of the small bowel loops seen on prior radiograph 06/24/2022. Electronically Signed   By: Ileana Roup M.D.   On: 06/28/2022 11:06     Medications:    sodium chloride Stopped (06/25/22 1829)   fluconazole (DIFLUCAN) IV Stopped (06/28/22 2137)   meropenem (MERREM) IV Stopped (06/28/22 1822)  TPN ADULT (ION) 70 mL/hr at 06/29/22 1300   TPN ADULT (ION)      sodium chloride   Intravenous Once   Chlorhexidine Gluconate Cloth  6 each Topical Q0600   heparin injection (subcutaneous)  5,000 Units Subcutaneous Q8H   heparin sodium (porcine)       insulin aspart  0-15 Units Subcutaneous Q4H    insulin glargine-yfgn  5 Units Subcutaneous BID   multivitamin  1 tablet Per Tube QHS   mouth rinse  15 mL Mouth Rinse 4 times per day   pantoprazole  40 mg Intravenous Q12H   sodium chloride flush  10-40 mL Intracatheter Q12H   sodium chloride, acetaminophen (TYLENOL) oral liquid 160 mg/5 mL, acetaminophen **OR** acetaminophen, alteplase, guaiFENesin-dextromethorphan, haloperidol lactate, heparin, heparin sodium (porcine), HYDROmorphone (DILAUDID) injection, hydrOXYzine, iohexol, ipratropium-albuterol, lidocaine (PF), lidocaine-prilocaine, lip balm, ondansetron (ZOFRAN) IV, mouth rinse, oxyCODONE, pentafluoroprop-tetrafluoroeth, QUEtiapine, sodium chloride flush  Assessment/ Plan:  Mr. Xavier Esparza is a 76 y.o.  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  Normal kidney function prior to admission.  IV contrast exposure on 06/08/2022 and 06/09/2022.  Renal ultrasound negative for obstruction.  Urinalysis shows hematuria and mild proteinuria. Hematuria possibly related to Foley catheter.  Patient has history of prostate cancer. Received first dialysis treatment on June 16.   Patient required CRRT starting June 22 and stopped on 06/24/22   Update: Pt had HD today, it appears he will be transferred to tertiary care center.  Recommend continued dialysis treatments at tertiary care center.    Lab Results  Component Value Date   CREATININE 5.99 (H) 06/29/2022   CREATININE 8.84 (H) 06/28/2022    CREATININE 7.21 (H) 06/27/2022    Intake/Output Summary (Last 24 hours) at 06/29/2022 1348 Last data filed at 06/29/2022 1300 Gross per 24 hour  Intake 3136.88 ml  Output 399 ml  Net 2737.88 ml    Acute pancreatitis, severe necrotizing.  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    -  Patient maintained on TPN at this time.    Diabetes mellitus type 2 without complication.Home regimen includes metformin.  Hemoglobin A1c 6.2 on 01/08/2022.  -Primary team to manage sliding scale insulin.  Acute ileus, receiving IV narcotics for severe acute pancreatitis. Tube feeds stopped and NGT placed to LWIS. Rectal tube placed  EGD showed 3 gastric ulcers, no active bleeding noted.     -  Still with significant abdominal distension.  It appears he's been accepted to tertiary care center for further evaluation.     LOS: 21 Alec Jaros 7/4/20231:48 PM

## 2022-06-29 NOTE — Assessment & Plan Note (Signed)
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  With low-grade fever on 6/23 and I repeated blood cultures which are negative for 3 days.  Repeat CT scan does not show any abscess on the pancreas but does show fluid collection still in the liver which could be an abscess.  Empirically on meropenem and Diflucan.  Tumor markers ordered including CEA normal range at 0.8 and normal alpha-fetoprotein at less than 1.8.  CA 19-9 within normal range currently on TPN.   Patient is critically ill.  Appreciate palliative care consultation. After accidental removal of NG tube patient is tolerating some p.o. intake. Repeat CT abdomen with some increase in peripancreatic fluid, there was some new omental nodularity concerning for metastatic disease noted.  Tumor markers were negative but he also has an history of prostate cancer. -Check PSA-within normal limit -Continue antibiotics -NG tube was replaced due to worsening abdominal distention, as KUB was without any significant abnormality, we can try trickle feed again. -Continue with TPN -Trickle feed was discontinued today and NG tube was placed on suctioning due to worsening distention and concern of GI bleed.

## 2022-06-29 NOTE — Progress Notes (Signed)
Patient signed out by family to be sent via private ambulance ([facility]) to [facility] Emergency Department. No belongings at bedside with patient, family confirmed they had all patient's belongings. PICC line saline locked. Permcath had been heparin locked by dialysis RN. Patient sent on 4L nasal cannula.

## 2022-07-02 LAB — TYPE AND SCREEN
ABO/RH(D): O POS
Antibody Screen: NEGATIVE
Unit division: 0
Unit division: 0
Unit division: 0

## 2022-07-02 LAB — BPAM RBC
Blood Product Expiration Date: 202308022359
Blood Product Expiration Date: 202308032359
Blood Product Expiration Date: 202308032359
ISSUE DATE / TIME: 202307031644
ISSUE DATE / TIME: 202307041309
Unit Type and Rh: 5100
Unit Type and Rh: 5100
Unit Type and Rh: 5100

## 2022-07-19 NOTE — Discharge Summary (Signed)
Physician Discharge Summary   Patient: Xavier Esparza MRN: [id number] DOB: December 24, 1946  Admit date:     06/08/2022  Discharge date: 06/29/2022  Discharge Physician: Lorella Nimrod   PCP: Tonia Ghent, MD   Recommendations at discharge:   Patient left AMA and daughter transported privately to [facility].  Discharge Diagnoses: Principal Problem:   Acute necrotizing pancreatitis Active Problems:   Acute blood loss anemia   Upper GI bleed   Severe sepsis (HCC)   Portal vein thrombosis   AKI (acute kidney injury) ([name])   Acute metabolic encephalopathy   Syncope   Nephrolithiasis   History of prostate cancer   Diabetes mellitus without complication (HCC)   BPH (benign prostatic hyperplasia)  Resolved Problems:   * No resolved hospital problems. *  Hospital Course: 75 y.o. male with medical history significant for DM, BPH, history of prostate cancer, kidney stones status post lithotripsy, negative colonoscopy 2022 (3 benign polyps), who presents to the ED by EMS following a syncopal episode at home while walking to the bathroom.  Wife said she heard a loud thud and went to find him on the floor.  She said he was awake when she got there but he could not help himself up and she called 911.  Patient states he was in his usual state of health but about 2 weeks prior he saw a little blood in his urine similar to when he had his kidney stone.  He saw his urologist yesterday who gave him a prescription for Levaquin.  He otherwise denied feeling unwell previous to the episode.  He denied chest pain, shortness of breath, headache or visual disturbance, one-sided numbness weakness or tingling, nausea or vomiting, abdominal pain, urinary frequency or burning or diarrhea.[facility] reported BP 76/45 and he was treated with IM epinephrine and a 400 mL fluid bolus.  He was awake and alert x4 by arrival.  After his arrival in the emergency room he developed abdominal pain and vomiting.   Initial CT abdomen pelvis with  moderate acute pancreatitis no evidence of pancreatic necrosis or pseudocyst.  Follow-up MRI abdomen concerning for possible choledocholithiasis with evidence of necrotizing pancreatitis.  Case discussed with GI Dr. Marius Ditch at [facility].   There was mention of possible portal vein thrombosis.  Heparin GTT was initially started however patient developed suspected acute hematuria so heparin GTT was stopped.  VQ scan ordered and perfusion component will be completed prior to patient transfer.  6/15: Clinical status overall unchanged.  Patient remains with abdominal pain associated with nausea.  Hemodynamically stable.  Mild elevation in white count.  Lipase and LFTs have improved over interval.  Discussed case with GI and general surgery.  Likely that patient's presentation is secondary to necrotizing pancreatitis.  Currently as there is no CBD stone and there is no strong indication for transfer for ERCP.  From a surgical standpoint no intervention is recommended.  There is nothing from an interventional radiology standpoint to drain.  As such we will discontinue transfer and keep patient in [facility].   6/16: No appreciable change in clinical status.  LFTs are downtrending.  White count slightly downtrending.  No fevers noted over interval.  Increasing abdominal distention noted.  Ascites and peripancreatic fluid seen on repeat CAT scan today.  Discussed case with PCCM, nephrology, GI.  Will attempt postpyloric feedings.  Kidney function continues to deteriorate.  Nephrology will initiate hemodialysis today.   6/17: No change in clinical status.  White count remains minimally elevated.  No  fevers over interval.  Remains with abdominal distention.  Started HD yesterday.  We will run another round of HD today.   6/18: No appreciable clinical changes.  White blood cell count slightly downtrending.  Creatinine worsened despite 2 rounds of hemodialysis.  Remains with hematuria despite stopping heparin 2 days ago.   6/19:  Increasing abd distention.  KUB consistent with ileus.  [place] negative for ascites   6/20: Postpyloric Dobbhoff discontinued.  NGT placed.  Tip in stomach. Set to low intermittent suction.  Rectal tube in place.  Abdominal distention improved.  White blood cell count increased to 22  For the week of 6/21 through 06/22/2022:  the patient had an upper GI bleed and was transfused 3 units of packed red blood cells.  He still remains anemic.  EGD showing gastric ulcers.  Patient was initially on Protonix drip now on Protonix twice a day.  Last hemoglobin 7.3.  CRRT was started on 06/17/2022 and continuing as per nephrology.  The patient is still anuric.  The patient severe necrotizing pancreatitis.  Repeat CT scan showed increased nodularity in the omentum.  No discrete abscess about the pancreas.  Did see possible liver abscess.  Antibiotics were changed over to meropenem and Diflucan.  Repeat blood cultures on 06/18/2022 are negative.  The patient did have an ileus on x-ray so tube feeds were stopped.  Patient is on TPN and has a PICC line.  The patient's mental status has waxed and waned during the week and CT scan of the head was negative.  6/28: Patient continued to experience abdominal pain.  Nephrology is planning to continue CRRT for now.  Continue to have NG tube but secretions are decreasing.  Remained on TPN.  6/29: Clinically seems stable.  Continue to require pain medications.  Nephrology is going to start him on intermittent dialysis and stopping CRRT.  Also trying trickle feeds through NG tube as NG tube secretions remained minimal. Hypophosphatemia which is being repleted. Anemia panel consistent with anemia of chronic disease with some iron deficiency. Ferritin levels elevated. We will start him on iron supplement once tube feed established.  6/30: Patient with very slow improvement.  Able to tolerate trickle food, rate was increased today. Started on intermittent dialysis.  Going for  permanent HD catheter today.  7/1: Patient continued to have low-grade fever at 100.  Had his HD catheter placed by vascular surgery yesterday.  Hemoglobin dropped to 6.2 this morning, getting 1 unit of PRBC. Ordered repeat CT abdomen due to persistent low-grade fever and leukocytosis. NG tube was pulled out by patient, will try p.o. intake before replacing.  7/2: Patient becomes more confused and agitated overnight.  Significant elevation of BUN to 103 and creatinine of 7.21, last dialysis was on Friday.  Tolerating some p.o. intake, remained on TPN. CT abdomen was completed last night and shows as below. 1. Findings consistent with marked severity acute necrotizing pancreatitis, with interval increase in peripancreatic fluid since the prior study. 2. Interval development of marked severity enteritis involving multiple loops of distal small bowel. 3. Extensive omental nodularity and thickening throughout the abdomen and pelvis, concerning for the presence of metastatic disease. 4. 6 mm nonobstructing right renal calculus. 5. Colonic diverticulosis. 6. Moderate to marked severity bibasilar consolidation with a small right pleural effusion. 7. Multiple cystic appearing areas scattered throughout the liver parenchyma, consistent with hepatic cysts. 8. Stable heterogeneous density within the right hepatic lobe which corresponds to a prior region of portal vein thrombosis  and suspected micro abscesses. 9. Small hiatal hernia. 10. Interval removal of the nasogastric tube and Foley catheter seen on the prior study. 11. Aortic atherosclerosis. Patient has an history of prostate cancer, checking PSA for concern of omental nodularity.  7/3: Patient was becoming more lethargic and somnolent with markedly elevated BUN at 140.  Denies any pain but abdomen seems more distended so KUB was obtained which was without any significant abnormality.  NG tube was placed for suctioning which resulted in  improvement in abdominal distention.  Wife requested for [facility] transfer stating that Dr. Tillie Rung has accepted him and he can go to [facility] now.  Contacted [facility] transfer center and apparently she is a GI provider and does not have admitting privileges.  Patient has to go either through medical or surgical services, request was made again and had a long discussion with their internal medicine service but they declined the transfer stating that nothing much can be added medically for him. Their surgery service wants a surgeon to surgeon call to discuss about any surgical possibility. Going for dialysis today. Another unit of PRBC was ordered today.  7/4: Patient remained quite lethargic, some improvement as compared to yesterday after getting dialysis.  Hemoglobin increased to 8 after transfusion and then decreased to 7 this morning.  Melanotic stools in the bag.  Markedly distended abdomen with diffuse tenderness. Trickle feed was stopped and NG tube was placed on suctioning. General surgery was reconsulted and they tried reaching someone at [facility] surgery as patient will require a very specialized procedure if possible which is beyond the scope of current facility. Per Dr. Hampton Abbot he tried multiple times and could not reach anyone. GI was again consulted today.  Ordered 2 more unit of PRBC.  Plan another EGD for tomorrow. Family very frustrated with the current situation and our inability to transfer him to a tertiary care center despite multiple attempts.  They are considering transferring him to [facility] ED privately either via personal transportation or by hiring a private ambulance. Had a long discussion with daughter and wife.  Patient is hemodynamically stable but is very high risk for deterioration and death.  He is critically ill.  We we will discharge him if that is what they want to do, no final decision yet.  Patient later left AMA and daughter transported privately to [facility] for further  management.  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  With low-grade fever on 6/23 and I repeated blood cultures which are negative for 3 days.  Repeat CT scan does not show any abscess on the pancreas but does show fluid collection still in the liver which could be an abscess.  Empirically on meropenem and Diflucan.  Tumor markers ordered including CEA normal range at 0.8 and normal alpha-fetoprotein at less than 1.8.  CA 19-9 within normal range currently on TPN.   Patient is critically ill.  Appreciate palliative care consultation. After accidental removal of NG tube patient is tolerating some p.o. intake. Repeat CT abdomen with some increase in peripancreatic fluid, there was some new omental nodularity concerning for metastatic disease noted.  Tumor markers were negative but he also has an history of prostate cancer. -Check PSA-within normal limit -Continue antibiotics -NG tube was replaced due to worsening abdominal distention, as KUB was without any significant abnormality, we can try trickle feed again. -Continue with TPN -Trickle feed was discontinued today and NG tube was placed on  suctioning due to worsening distention and concern of GI bleed.  Acute blood loss anemia Last hemoglobin 8>>7.  Most likely having another GI bleed as stool looked melanotic. Patient received total of 5 units of packed red blood cells during the hospital course.  Had GI bleed on 06/16/2022.  Having some blood loss with the continuous dialysis if the filter gets clotted and unable to return blood. Anemia panel consistent with anemia of chronic disease with some iron deficiency. -Ordered 2 more unit PRBC to be given during dialysis today. -GI was reconsulted -Will start him on iron supplement once continue to tolerate p.o. intake.   Upper GI bleed Looks like having another GI bleed with melanotic stools. GI was  reconsulted and they are planning a repeat EGD tomorrow morning. Initial GI bleed was secondary to duodenal ulcers.  No active bleeding on EGD.  Monitor closely with heparin with dialysis.   -Continue Protonix IV twice daily  Severe sepsis ([place]) Present on admission secondary to severe necrotizing pancreatitis.  Empirically on meropenem and Diflucan.  Repeat blood cultures negative.  Acute metabolic encephalopathy Mental status waxes and wanes..  Will give as needed low-dose Seroquel at night. Need to continue pain medications.  AKI (acute kidney injury) [facility]) Patient was started on dialysis for AKI and anuria on 6/16. Received CRRT from 6/22 - 6/29. Permanent HD catheter was placed by vascular surgery on 6/30 and he was started on intermittent dialysis. Significant increase in BUN and creatinine .  Patient remained anuric.  Last dialysis was on Monday Nephrology is on board-will be getting another dialysis today.  Portal vein thrombosis Unable to do anticoagulation with drop in hemoglobin and upper GI bleed.  Syncope Secondary to hypotension   Nephrolithiasis Nonobstructing right renal stone.  No acute issues suspected  History of prostate cancer Repeat CT abdomen with concern of omental metastatic disease. -Check PSA  BPH (benign prostatic hyperplasia) No acute issues  Diabetes mellitus without complication (HCC) Sugars elevated while on TPN.  -Add Semglee 5 units twice daily -Continue with SSI         Consultants: General surgery, gastroenterology, IR, PCCM  DISCHARGE MEDICATION: Allergies as of 06/29/2022   No Known Allergies      Medication List     TAKE these medications    acyclovir 800 MG tablet Commonly known as: ZOVIRAX TAKE 1 TABLET DAILY   aspirin EC 81 MG tablet Take 81 mg by mouth daily. Swallow whole.   CALCIUM 600 PO Take 2 tablets by mouth daily.   [employer] FISH OIL PO Take 1,000 mg by mouth daily.   GLUCOSAMINE HCL PO Take 1 tablet by  mouth daily.   losartan 25 MG tablet Commonly known as: COZAAR Take 1 tablet (25 mg total) by mouth daily.   Magnesium 500 MG Tabs Take 1 tablet by mouth daily.   metFORMIN 500 MG tablet Commonly known as: GLUCOPHAGE TAKE 1 TABLET DAILY   multivitamin tablet Take 1 tablet by mouth daily. Reported on 01/07/2016   Red Yeast Rice 600 MG Tabs Take 2 tablets (1,200 mg total) by mouth daily.   Testosterone 1.62 % Gel Apply 2 Pump topically daily.       ASK your doctor about these medications    rosuvastatin 20 MG tablet Commonly known as: CRESTOR TAKE 1 TABLET EVERY OTHER  DAY IN THE MORNING Ask about: Which instructions should I use?        Discharge Exam: Filed Weights   06/29/22 0500 06/29/22 1242  06/29/22 1609  Weight: 105.2 kg 107.6 kg 107.7 kg   General.  Lethargic gentleman, in no acute distress. Pulmonary.  Lungs clear bilaterally, normal respiratory effort. CV.  Regular rate and rhythm, no JVD, rub or murmur. Abdomen.  Soft, nontender, nondistended, BS positive. CNS.  Alert and oriented .  No focal neurologic deficit. Extremities.  No edema, no cyanosis, pulses intact and symmetrical. Psychiatry.  Judgment and insight appears impaired.  Condition at discharge: stable  The results of significant diagnostics from this hospitalization (including imaging, microbiology, ancillary and laboratory) are listed below for reference.   Imaging Studies: DG Abd 1 View  Result Date: 06/28/2022 CLINICAL DATA:  Nasogastric placement EXAM: ABDOMEN - 1 VIEW COMPARISON:  Earlier same day FINDINGS: Nasogastric tube tip appears the same, with the tip in the mid body of the stomach. IMPRESSION: No apparent change.  Nasogastric tip in the mid body of the stomach. Electronically Signed   By: Nelson Chimes M.D.   On: 06/28/2022 15:30   DG Abd 1 View  Result Date: 06/28/2022 CLINICAL DATA:  Abdominal distention, nasogastric tube placement. EXAM: ABDOMEN - 1 VIEW COMPARISON:  Abdominal  radiograph 06/24/2022. FINDINGS: Nasogastric tube tip and side hole project at the level of the gastric body. Mild gaseous distention of the stomach. Scattered air throughout the small bowel and colon with improvement of the gaseous distention noted on 06/24/2022. Brachytherapy seeds noted at the prostate. No free intraperitoneal air on these supine images. IMPRESSION: 1. Nasogastric tube tip and side hole project at the level of the gastric body. 2. Interval improvement of gaseous distention of the small bowel loops seen on prior radiograph 06/24/2022. Electronically Signed   By: Ileana Roup M.D.   On: 06/28/2022 11:06   CT ABDOMEN PELVIS WO CONTRAST  Result Date: 06/26/2022 CLINICAL DATA:  Persistent low-grade fever and leukocytosis sent to rule out abscess. EXAM: CT ABDOMEN AND PELVIS WITHOUT CONTRAST TECHNIQUE: Multidetector CT imaging of the abdomen and pelvis was performed following the standard protocol without IV contrast. RADIATION DOSE REDUCTION: This exam was performed according to the departmental dose-optimization program which includes automated exposure control, adjustment of the mA and/or kV according to patient size and/or use of iterative reconstruction technique. COMPARISON:  June 18, 2022 FINDINGS: Lower chest: Moderate to marked severity areas of consolidation are seen within the posteromedial aspects of the bilateral lung bases. Small bilateral pleural effusions are noted. Hepatobiliary: Numerous cystic appearing areas are seen scattered throughout the liver parenchyma. Heterogeneous appearance of a portion of the right lobe of the liver is again seen. This is stable in appearance and corresponds to an area of previously described portal vein thrombosis and suspected micro abscesses. No gallstones, gallbladder wall thickening, or biliary dilatation. Pancreas: There is marked severity peripancreatic inflammatory fat stranding and peripancreatic fluid. While the peripancreatic inflammatory  fat stranding is predominant stable in severity, the amount of peripancreatic fluid is increased. This is most prominent within the regions in between the body of the pancreas and posterior wall of the body of the stomach. The low-attenuation lesion noted within the mid body of the pancreas on the prior study is not clearly identified. This is limited in evaluation in the absence of intravenous contrast. Spleen: Normal in size without focal abnormality. Adrenals/Urinary Tract: Adrenal glands are unremarkable. Kidneys are normal in size, without obstructing renal calculi or hydronephrosis. A 3.6 cm diameter cyst is seen within the posterior aspect of the mid to lower left kidney. A 6 mm nonobstructing renal calculus  is seen within the lower pole of the right kidney. A small amount of air is seen within the lumen of the urinary bladder. The Foley catheter seen on the prior study has been removed. Stomach/Bowel: There is a small hiatal hernia. The nasogastric tube seen on the prior study has been removed. Appendix appears normal. No evidence of bowel dilatation. Markedly thickened and inflamed loops of small bowel are seen within the right lower quadrant and right hemipelvis (axial CT images 58 through 80, CT series 3). This represents a new finding when compared to the prior study. Noninflamed diverticula are seen throughout the large bowel. Vascular/Lymphatic: Aortic atherosclerosis. A venous catheter is seen entering via the right groin. No enlarged abdominal or pelvic lymph nodes. Reproductive: Multiple prostate radiation implantation seeds are seen. Other: No abdominopelvic ascites. Extensive omental nodularity and thickening is again seen throughout the abdomen Musculoskeletal: Moderate to marked severity anasarca is seen along the lateral aspects of the lower abdominal and pelvic walls, as well as the lateral aspects of both hips. Multilevel degenerative changes are seen throughout the lumbar spine. IMPRESSION:  1. Findings consistent with marked severity acute necrotizing pancreatitis, with interval increase in peripancreatic fluid since the prior study. 2. Interval development of marked severity enteritis involving multiple loops of distal small bowel. 3. Extensive omental nodularity and thickening throughout the abdomen and pelvis, concerning for the presence of metastatic disease. 4. 6 mm nonobstructing right renal calculus. 5. Colonic diverticulosis. 6. Moderate to marked severity bibasilar consolidation with a small right pleural effusion. 7. Multiple cystic appearing areas scattered throughout the liver parenchyma, consistent with hepatic cysts. 8. Stable heterogeneous density within the right hepatic lobe which corresponds to a prior region of portal vein thrombosis and suspected micro abscesses. 9. Small hiatal hernia. 10. Interval removal of the nasogastric tube and Foley catheter seen on the prior study. 11. Aortic atherosclerosis. Aortic Atherosclerosis (ICD10-I70.0). Electronically Signed   By: Virgina Norfolk M.D.   On: 06/26/2022 23:43   PERIPHERAL VASCULAR CATHETERIZATION  Result Date: 06/25/2022 See surgical note for result.  US Abdomen Limited RUQ (LIVER/GB)  Result Date: 06/24/2022 CLINICAL DATA:  Liver abscess EXAM: ULTRASOUND ABDOMEN LIMITED RIGHT UPPER QUADRANT COMPARISON:  CT 06/18/2022, 06/11/2022, MRI 06/08/2022 FINDINGS: Gallbladder: No gallstones or wall thickening visualized. No sonographic Murphy sign noted by sonographer. Common bile duct: Diameter: 6.1 mm Liver: Heterogenous echotexture. Cyst in the right posterior hepatic lobe measuring 3.2 x 5.2 x 2.9 cm. Multiple additional cysts within the liver, including septated cyst in the left hepatic lobe measuring 3.7 x 4.3 x 4 cm. No discrete abscess collection to correspond to the questioned area on CT and MRI. Portal vein is patent on color Doppler imaging with normal direction of blood flow towards the liver. Other: None. IMPRESSION:  1. No definite drainable abscess collection by ultrasound to correspond to the areas seen on CT and MRI. 2. There are multiple liver cysts both simple and complex, see MRI 06/08/2022. Electronically Signed   By: Donavan Foil M.D.   On: 06/24/2022 16:47   DG Chest Port 1 View  Result Date: 06/24/2022 CLINICAL DATA:  Abdominal distension and hypoxia. EXAM: PORTABLE CHEST 1 VIEW COMPARISON:  Chest radiograph 06/14/2022 and abdominal CT 06/18/2022 FINDINGS: Nasogastric tube extends into the abdomen. Right arm PICC line tip terminates in the SVC region. Again noted are slightly low lung volumes. Improved aeration at the right lung base compared to the previous chest radiographic examination. Few densities along the medial right lung base are  suggestive for atelectasis. Otherwise, no significant airspace disease or consolidation. Heart size is normal. IMPRESSION: Residual densities at the medial right lung base probably represent atelectasis. Otherwise, no acute chest findings. Electronically Signed   By: Markus Daft M.D.   On: 06/24/2022 09:12   DG Abd 1 View  Result Date: 06/24/2022 CLINICAL DATA:  Abdominal distension and hypoxia. EXAM: ABDOMEN - 1 VIEW COMPARISON:  06/21/2022 FINDINGS: There is a right femoral dialysis catheter with the tip in the lower IVC region. Brachytherapy seeds in the prostate. Bowel gas throughout the abdomen and pelvis. There is evidence for gas in the stomach, small bowel and colon. There is a nasogastric tube which terminates in the stomach body region. Limited evaluation for free air on the supine images. IMPRESSION: 1. Mild gaseous distension throughout the abdomen and pelvis. Findings are nonspecific but could represent an ileus pattern. 2. Right femoral dialysis catheter with the tip in the lower IVC region. Electronically Signed   By: Markus Daft M.D.   On: 06/24/2022 09:06   DG Abd 1 View  Result Date: 06/21/2022 CLINICAL DATA:  Concern for ileus EXAM: ABDOMEN - 1 VIEW  COMPARISON:  CT 06/18/2022 FINDINGS: Diffuse gaseous distension of bowel in a nonobstructive pattern. Nasogastric tube tip and side port overlie the stomach. IMPRESSION: Diffuse gaseous distention of bowel compatible with ileus. Electronically Signed   By: Maurine Simmering M.D.   On: 06/21/2022 10:35    Microbiology: Results for orders placed or performed during the hospital encounter of 06/08/22  Blood culture (routine x 2)     Status: None   Collection Time: 06/08/22  1:33 PM   Specimen: BLOOD  Result Value Ref Range Status   Specimen Description BLOOD LEFT HAND  Final   Special Requests   Final    BOTTLES DRAWN AEROBIC AND ANAEROBIC Blood Culture results may not be optimal due to an inadequate volume of blood received in culture bottles   Culture   Final    NO GROWTH 5 DAYS Performed at [facility], [address], [place], [place] [address]    Report Status 06/13/2022 FINAL  Final  Blood culture (routine x 2)     Status: None   Collection Time: 06/08/22  2:15 PM   Specimen: BLOOD  Result Value Ref Range Status   Specimen Description BLOOD BLOOD LEFT WRIST  Final   Special Requests   Final    BOTTLES DRAWN AEROBIC AND ANAEROBIC Blood Culture results may not be optimal due to an inadequate volume of blood received in culture bottles   Culture   Final    NO GROWTH 5 DAYS Performed at [facility], [address] [place], [place] [address]    Report Status 06/13/2022 FINAL  Final  MRSA Next Gen by PCR, Nasal     Status: None   Collection Time: 06/10/22  4:14 PM   Specimen: Nasal Mucosa; Nasal Swab  Result Value Ref Range Status   MRSA by PCR Next Gen NOT DETECTED NOT DETECTED Final    Comment: (NOTE) The GeneXpert MRSA Assay (FDA approved for NASAL specimens only), is one component of a comprehensive MRSA colonization surveillance program. It is not intended to diagnose MRSA infection nor to guide or monitor treatment for MRSA infections. Test performance is not  FDA approved in patients less than 22 years old. Performed at [facility], [address] [place], [place] [address]   SARS Coronavirus 2 by RT PCR (hospital order, performed in  [facility] hospital lab) *cepheid single result test* Anterior Nasal Swab     Status: None   Collection Time: 06/10/22  5:17 PM   Specimen: Anterior Nasal Swab  Result Value Ref Range Status   SARS Coronavirus 2 by RT PCR NEGATIVE NEGATIVE Final    Comment: (NOTE) SARS-CoV-2 target nucleic acids are NOT DETECTED.  The SARS-CoV-2 RNA is generally detectable in upper and lower respiratory specimens during the acute phase of infection. The lowest concentration of SARS-CoV-2 viral copies this assay can detect is 250 copies / mL. A negative result does not preclude SARS-CoV-2 infection and should not be used as the sole basis for treatment or other patient management decisions.  A negative result may occur with improper specimen collection / handling, submission of specimen other than nasopharyngeal swab, presence of viral mutation(s) within the areas targeted by this assay, and inadequate number of viral copies (<250 copies / mL). A negative result must be combined with clinical observations, patient history, and epidemiological information.  Fact Sheet for Patients:   https://www.patel.info/  Fact Sheet for Healthcare Providers: https://hall.com/  This test is not yet approved or  cleared by the [place] FDA and has been authorized for detection and/or diagnosis of SARS-CoV-2 by FDA under an Emergency Use Authorization (EUA).  This EUA will remain in effect (meaning this test can be used) for the duration of the COVID-19 declaration under Section 564(b)(1) of the Act, 21 U.S.C. section 360bbb-3(b)(1), unless the authorization is terminated or revoked sooner.  Performed at [facility], [address] [place], [place] [address]    Culture, blood (Routine X 2) w Reflex to ID Panel     Status: None   Collection Time: 06/18/22 10:03 AM   Specimen: BLOOD  Result Value Ref Range Status   Specimen Description BLOOD RIGHT HAND  Final   Special Requests   Final    BOTTLES DRAWN AEROBIC ONLY Blood Culture adequate volume   Culture   Final    NO GROWTH 5 DAYS Performed at [facility], [address], [place], [place] [address]    Report Status 06/23/2022 FINAL  Final  Culture, blood (Routine X 2) w Reflex to ID Panel     Status: None   Collection Time: 06/18/22 10:20 AM   Specimen: BLOOD  Result Value Ref Range Status   Specimen Description BLOOD LEFT AC  Final   Special Requests   Final    BOTTLES DRAWN AEROBIC AND ANAEROBIC Blood Culture adequate volume   Culture   Final    NO GROWTH 5 DAYS Performed at [facility], [address] [place], [place] [address]    Report Status 06/23/2022 FINAL  Final    Labs: CBC: No results for input(s): "WBC", "NEUTROABS", "HGB", "HCT", "MCV", "PLT" in the last 168 hours. Basic Metabolic Panel: No results for input(s): "NA", "K", "CL", "CO2", "GLUCOSE", "BUN", "CREATININE", "CALCIUM", "MG", "PHOS" in the last 168 hours. Liver Function Tests: No results for input(s): "AST", "ALT", "ALKPHOS", "BILITOT", "PROT", "ALBUMIN" in the last 168 hours. CBG: No results for input(s): "GLUCAP" in the last 168 hours.  Discharge time spent: greater than 30 minutes.  .sad  Signed: Lorella Nimrod, MD [facility] 07/19/2022

## 2022-08-01 ENCOUNTER — Other Ambulatory Visit: Payer: Self-pay | Admitting: Family Medicine

## 2022-08-02 NOTE — Telephone Encounter (Signed)
Refill request acyclovir Last refill 01/25/22 #90/1 Last office visit 03/19/22 acute No upcoming appointment scheduled with PCP

## 2022-08-03 NOTE — Telephone Encounter (Signed)
Please check with patient's wife.  My understanding is that he is still inpatient and I am thinking about both of them.  If still inpatient, then I think it makes sense to deny this rx unless it is clear that he'll still be on it at discharge.  Please let me know what you hear.  Thanks.

## 2022-08-19 ENCOUNTER — Other Ambulatory Visit: Payer: Self-pay | Admitting: Family Medicine

## 2022-09-03 ENCOUNTER — Other Ambulatory Visit: Payer: Self-pay | Admitting: Family Medicine

## 2022-10-08 DIAGNOSIS — R188 Other ascites: Secondary | ICD-10-CM | POA: Insufficient documentation

## 2022-10-08 DIAGNOSIS — Z931 Gastrostomy status: Secondary | ICD-10-CM | POA: Insufficient documentation

## 2022-10-08 DIAGNOSIS — I639 Cerebral infarction, unspecified: Secondary | ICD-10-CM | POA: Insufficient documentation

## 2022-10-08 DIAGNOSIS — I38 Endocarditis, valve unspecified: Secondary | ICD-10-CM | POA: Insufficient documentation

## 2022-10-23 ENCOUNTER — Other Ambulatory Visit: Payer: Self-pay | Admitting: Family Medicine

## 2022-10-25 NOTE — Telephone Encounter (Signed)
Patient is overdue for appt. Please call to set up DM f/u appt.

## 2022-10-25 NOTE — Telephone Encounter (Signed)
Lvmtcb & sent pt mychart message

## 2022-10-26 DIAGNOSIS — I639 Cerebral infarction, unspecified: Secondary | ICD-10-CM | POA: Insufficient documentation

## 2022-10-27 NOTE — Telephone Encounter (Signed)
Spoke with patient and he stated was in the hospital for 5 months due to pancreatitis. He is currently at [facility] in [place], [place] and once he is out and well he will call to make an appointment.

## 2022-11-15 ENCOUNTER — Telehealth: Payer: Self-pay | Admitting: Family Medicine

## 2022-11-15 NOTE — Telephone Encounter (Signed)
Pt's wife, Reginold Agent called asking if Janett Billow could give her a call regarding pt's rehab discharge. Call back # [phone]

## 2022-11-16 ENCOUNTER — Telehealth: Payer: Self-pay

## 2022-11-16 NOTE — Telephone Encounter (Signed)
Transition Care Management Follow-up Telephone Call Date of discharge and from where: [facility] on 11/13/2022 How have you been since you were released from the hospital? Patient has not had any pain feeling like he has had some improvement.  Any questions or concerns? Yes has a few questions about things when he has his follow up. Nothing that can be addressed at this time per patient.   Items Reviewed: Did the pt receive and understand the discharge instructions provided? Yes  Medications obtained and verified? Yes  Other? No  Any new allergies since your discharge? No  Dietary orders reviewed? Yes Do you have support at home? Yes   Home Care and Equipment/Supplies: Were home health services ordered? yes If so, what is the [facility] of the agency[facility]   Has the agency set up a time to come to the patient's home? no Were any new equipment or medical supplies ordered?  No What is the name of the medical supply agency? N/A  Were you able to get the supplies/equipment? not applicable Do you have any questions related to the use of the equipment or supplies? No  Functional Questionnaire: (I = Independent and D = Dependent) ADLs: I  Bathing/Dressing- I  Meal Prep- I  Eating- I   Maintaining continence- I  Transferring/Ambulation- I  Managing Meds- Patient has not been taking any medications that he was given   Follow up appointments reviewed:  PCP Hospital f/u appt confirmed? Yes  Scheduled to see Dr. Damita Dunnings on 11/23/2022 @ 11:30. [facility] f/u appt confirmed? Yes Scheduled to see 12/10/2022 for CT by Doctor at [facility].  Are transportation arrangements needed? No  If their condition worsens, is the pt aware to call PCP or go to the Emergency Dept.? Yes Was the patient provided with contact information for the PCP's office or ED? Yes Was to pt encouraged to call back with questions or concerns? Yes

## 2022-11-16 NOTE — Telephone Encounter (Signed)
When talking to patient states he has not had any medications at all after discharge. He has not taken a single medication after he got home. Please advise I tried to review medication list with patient but was very confused about what "list" he states that there are several from when he was at [facility] but some that came from rehab. He is very confused as to what he should be on until he is seen by Dr. Damita Dunnings.

## 2022-11-16 NOTE — Telephone Encounter (Signed)
Transition Care Management Unsuccessful Follow-up Telephone Call  Date of discharge and from where:  11/13/2022 from [facility] for Endocarditis   Attempts:  1st Attempt  Reason for unsuccessful TCM follow-up call:  Left voice message

## 2022-11-16 NOTE — Telephone Encounter (Signed)
Called and spoke with patient. He has follow up scheduled 11/23/22 to discuss discharge from rehab. His wife will be coming by the drop off some information for Dr. Damita Dunnings to review regarding the rehab.

## 2022-11-17 ENCOUNTER — Other Ambulatory Visit: Payer: Self-pay | Admitting: Family Medicine

## 2022-11-17 ENCOUNTER — Telehealth: Payer: Self-pay | Admitting: Family Medicine

## 2022-11-17 MED ORDER — CREON 24000-76000 UNITS PO CPEP: ORAL_CAPSULE | ORAL | 1 refills | Status: AC

## 2022-11-17 MED ORDER — APIXABAN 5 MG PO TABS: 5.0000 mg | ORAL_TABLET | Freq: Two times a day (BID) | ORAL | 2 refills | Status: DC

## 2022-11-17 MED ORDER — VITAMIN D3 25 MCG (1000 UT) PO CAPS: 1000.0000 [IU] | ORAL_CAPSULE | Freq: Every day | ORAL | 3 refills | Status: AC

## 2022-11-17 MED ORDER — NUTREN 1.5 EN LIQD: 215.0000 mL | Freq: Every day | ENTERAL | Status: DC

## 2022-11-17 MED ORDER — CALCIUM CARBONATE 600 MG PO TABS: 1200.0000 mg | ORAL_TABLET | Freq: Every day | ORAL | 3 refills | Status: DC

## 2022-11-17 MED ORDER — ROSUVASTATIN CALCIUM 20 MG PO TABS: 20.0000 mg | ORAL_TABLET | ORAL | 1 refills | Status: DC

## 2022-11-17 NOTE — Telephone Encounter (Signed)
Received paperwork. Scanned into the chart. I placed folder with information on Jessica's desk to call patient for pick up Monday. In case Dr Damita Dunnings needs to look at the physical papers next week. I did not scan the prescription drug interaction/information about each medication FYI. There is also a note in the folder that I left from the patient in case Dr Damita Dunnings needs to look.

## 2022-11-17 NOTE — Progress Notes (Signed)
Called patient.  Discussed current med list.  He needs prescription sent.  Those were sent to local pharmacy.  He is going to get Nutren delivered.  He already has prescription for acyclovir so I did not send that to the pharmacy.  Rationale for current medications especially Nutren and Creon discussed with patient.  Discussed routine use of Creon.  He should be able to take Nutren by mouth since he is swallowing well.  The only potential issue would be the taste.  He will update me about that.  He will keep his office visit as scheduled.  We went over his current and also his discontinued medications.  He can bring them all to the visit next week.  He can bring them in separate bags and we will go over his med list again.  We talked about discontinuation of metformin, lisinopril, etc.

## 2022-11-17 NOTE — Telephone Encounter (Signed)
Patients wife came by and dropped off folder containing all of the information regarding the medication that patient was taking when he was in the rehabilitation center,he is now discharged. Dr Damita Dunnings is aware of this information. Folder left up front in [employer].

## 2022-11-17 NOTE — Telephone Encounter (Signed)
Late entry.  I called patient last night at the end of clinic.  I told him I was really glad to get to talk to him.  He has gone through a lot.  He had 2 main questions.  He was unsure about how to take his current medications, based on his discharge from the rehab facility.  He has not taken any medications since he was discharged on Saturday.  He has paperwork from facility and he is going to drop those off at clinic on Wednesday morning.  Please scan those in immediately and send me notification when he drops those off.  If they cannot be scanned immediately on Wednesday in the clinic then please call me.  I would like to look at the paperwork and then talk to him about medication options/plan.  He was unsure if he could take his nutritional supplement by mouth instead of taking it by G-tube.  He did not feel comfortable operating the G-tube on his own.  He is taking regular food by mouth and swallowing well.  I will check on that.

## 2022-11-17 NOTE — Telephone Encounter (Signed)
See other phone note. Working on this.

## 2022-11-18 ENCOUNTER — Other Ambulatory Visit: Payer: Self-pay | Admitting: Family Medicine

## 2022-11-23 ENCOUNTER — Encounter: Payer: Self-pay | Admitting: Family Medicine

## 2022-11-23 ENCOUNTER — Ambulatory Visit (INDEPENDENT_AMBULATORY_CARE_PROVIDER_SITE_OTHER): Payer: Medicare Other | Admitting: Family Medicine

## 2022-11-23 VITALS — BP 116/60 | HR 81 | Temp 96.5°F | Ht 72.0 in | Wt 170.0 lb

## 2022-11-23 DIAGNOSIS — E7800 Pure hypercholesterolemia, unspecified: Secondary | ICD-10-CM

## 2022-11-23 DIAGNOSIS — K859 Acute pancreatitis without necrosis or infection, unspecified: Secondary | ICD-10-CM | POA: Diagnosis not present

## 2022-11-23 DIAGNOSIS — K8591 Acute pancreatitis with uninfected necrosis, unspecified: Secondary | ICD-10-CM

## 2022-11-23 DIAGNOSIS — I6340 Cerebral infarction due to embolism of unspecified cerebral artery: Secondary | ICD-10-CM | POA: Diagnosis not present

## 2022-11-23 DIAGNOSIS — J3489 Other specified disorders of nose and nasal sinuses: Secondary | ICD-10-CM

## 2022-11-23 LAB — CBC WITH DIFFERENTIAL/PLATELET
Basophils Absolute: 0.1 10*3/uL (ref 0.0–0.1)
Basophils Relative: 0.9 % (ref 0.0–3.0)
Eosinophils Absolute: 0.2 10*3/uL (ref 0.0–0.7)
Eosinophils Relative: 2.6 % (ref 0.0–5.0)
HCT: 30.3 % — ABNORMAL LOW (ref 39.0–52.0)
Hemoglobin: 9.8 g/dL — ABNORMAL LOW (ref 13.0–17.0)
Lymphocytes Relative: 22.3 % (ref 12.0–46.0)
Lymphs Abs: 1.7 10*3/uL (ref 0.7–4.0)
MCHC: 32.4 g/dL (ref 30.0–36.0)
MCV: 88.5 fl (ref 78.0–100.0)
Monocytes Absolute: 0.6 10*3/uL (ref 0.1–1.0)
Monocytes Relative: 8.1 % (ref 3.0–12.0)
Neutro Abs: 4.9 10*3/uL (ref 1.4–7.7)
Neutrophils Relative %: 66.1 % (ref 43.0–77.0)
Platelets: 318 10*3/uL (ref 150.0–400.0)
RBC: 3.42 Mil/uL — ABNORMAL LOW (ref 4.22–5.81)
RDW: 18.4 % — ABNORMAL HIGH (ref 11.5–15.5)
WBC: 7.5 10*3/uL (ref 4.0–10.5)

## 2022-11-23 LAB — COMPREHENSIVE METABOLIC PANEL
ALT: 9 U/L (ref 0–53)
AST: 16 U/L (ref 0–37)
Albumin: 3.2 g/dL — ABNORMAL LOW (ref 3.5–5.2)
Alkaline Phosphatase: 52 U/L (ref 39–117)
BUN: 40 mg/dL — ABNORMAL HIGH (ref 6–23)
CO2: 29 mEq/L (ref 19–32)
Calcium: 9.4 mg/dL (ref 8.4–10.5)
Chloride: 105 mEq/L (ref 96–112)
Creatinine, Ser: 0.87 mg/dL (ref 0.40–1.50)
GFR: 83.16 mL/min (ref >60.00)
Glucose, Bld: 101 mg/dL — ABNORMAL HIGH (ref 70–99)
Potassium: 4.6 mEq/L (ref 3.5–5.1)
Sodium: 141 mEq/L (ref 135–145)
Total Bilirubin: 0.4 mg/dL (ref 0.2–1.2)
Total Protein: 7.4 g/dL (ref 6.0–8.3)

## 2022-11-23 LAB — LIPID PANEL
Cholesterol: 143 mg/dL (ref 0–200)
HDL: 46.3 mg/dL (ref >39.00)
NonHDL: 97.11
Total CHOL/HDL Ratio: 3
Triglycerides: 230 mg/dL — ABNORMAL HIGH (ref 0.0–149.0)
VLDL: 46 mg/dL — ABNORMAL HIGH (ref 0.0–40.0)

## 2022-11-23 LAB — LIPASE: Lipase: 22 U/L (ref 11.0–59.0)

## 2022-11-23 LAB — LDL CHOLESTEROL, DIRECT: Direct LDL: 64 mg/dL

## 2022-11-23 MED ORDER — LORATADINE 10 MG PO TABS: 10.0000 mg | ORAL_TABLET | Freq: Every day | ORAL | Status: DC

## 2022-11-23 MED ORDER — MELATONIN 10 MG PO CAPS: 10.0000 mg | ORAL_CAPSULE | Freq: Every evening | ORAL | Status: AC | PRN

## 2022-11-23 NOTE — Patient Instructions (Addendum)
Keep going with PT and OT.  Add on creon when possible.  Keep the appointments [facility]. Ask nursing about flushing your tube.   Try taking plain claritin (not claritin D) and see if that helps your runny nose and ear symptoms.   Take care.  Glad to see you.

## 2022-11-23 NOTE — Progress Notes (Unsigned)
History of necrotizing pancreatitis with multiple complications.  His course was complicated by melena (EGD w/ nonbleeding ulcers in stomach/duodenum), acute renal failure (HD then CRRT then HD again), ileus (requiring TPN) and sepsis. 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 He was admitted to MICU where his course was further complicated by toxic metabolic encephalopathy in setting of hypercarbic respiratory failure, severe protein malnutrition, DVT/PVT, bradycardia status post temporary pacemaker, and multiple abdominal and retroperitoneal fluid collections. He completed CT-guided drain placement on 7/25 and 8/3 He was ultimately brought for EUS guided cystgastrsotomy wuith AXIOS on 08/04/2022. Short interval imaging was planned but patient was transitioned back to MICU on 8/13 for worsening encephalopathy and pulmonary status with slight hypercarbia.  He ultimately stabalized and was able to complete necrosectomy on 08/16/2022 and cystogastrostomy stent removal on 9/12 with two double pigtail stents left in place due to the size of his walled off necrosis. 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 He was ultimately discharged to SNF on 10/26/2022 with PICC line for antibiotics. Percutaneous drains had been removed. He was tolerating oral intake without pain.  He has PEG-J.  He is now back at home.  His friend died in the midst of his illness.  Discussed partial memory of events with illness and possible counseling for grief. He'll update me as needed.   Discussed rationale for his current medications.  No fevers chills nausea vomiting.  No blood in stool.  He is going to follow-up with the [place] clinic.  There is the consideration of eventual tube removal.  He was not able to get nutren initially and was taking all food by mouth.  He could not get Creon filled before the office visit due to lack of availability.  He is going to start using Creon as soon as he can get the prescription filled.  He has had nocturia noted but we talked about not treating him for BPH at this point as we do not want to complicate his situation and potentially induce orthostasis.  He is deconditioned with PT and OT on going home.  He is also had a runny nose but no fevers.  Meds, vitals, and allergies reviewed.   ROS: Per HPI unless specifically indicated in ROS section   Nad Ncat Weight loss noted from prior habitus. Neck supple, no LA Rrr Ctab Abd soft, not ttp G-tube in place, appears intact and normal. Mild clear rhinorrhea TM wnl B.  Skin well-perfused.  40 minutes were devoted to patient care in this encounter (this includes time spent reviewing the patient's file/history, interviewing and examining the patient, counseling/reviewing plan with patient).

## 2022-11-24 ENCOUNTER — Telehealth: Payer: Self-pay | Admitting: Family Medicine

## 2022-11-24 DIAGNOSIS — J3489 Other specified disorders of nose and nasal sinuses: Secondary | ICD-10-CM | POA: Insufficient documentation

## 2022-11-24 NOTE — Telephone Encounter (Signed)
Please give the order.  Thanks.   

## 2022-11-24 NOTE — Telephone Encounter (Signed)
Pt wife called requesting a call back stated she has question and concerns about how  pt should take his medication . Please advise # [phone]

## 2022-11-24 NOTE — Assessment & Plan Note (Signed)
Continue with Eliquis.  See notes on labs.  Continue Crestor.

## 2022-11-24 NOTE — Assessment & Plan Note (Signed)
Reasonable to continue taking food by mouth.  He was able to take Nutren by mouth.  We talked about flushing his tube and he can get nursing help with that at home.  He is deconditioned and he is going to continue with OT and PT.  We talked about starting Creon as soon as possible and having GI follow-up.  He would like to get his G-tube removed as soon as feasible.  We talked about GI follow-up at [facility].  Both the patient and I are grateful for all of the help that [facility] has provided.

## 2022-11-24 NOTE — Telephone Encounter (Signed)
Verbal orders given to Radovan for OT 

## 2022-11-24 NOTE — Telephone Encounter (Signed)
Home Health verbal orders Caller Name [place] Name: Xavier Esparza number: [phone]  Requesting OT/PT/Skilled nursing/Social Work/Speech:  Reason:OT   Frequency:1 X  w  for  8 W  Please forward to [facility] pool or providers CMA

## 2022-11-24 NOTE — Telephone Encounter (Signed)
Patient's wife notified of instructions and verbalized understanding.

## 2022-11-24 NOTE — Telephone Encounter (Signed)
The goal here is to get nuten 5 times a day and take creon with each of them.  That assumes another meal on top of that for the 6th pill of creon.    Basically, I would take 1 creon with each meal or drink.  As his PO intake increases, he may be able to substitute a meal for a Nutren.    Let me know if this does not make sense.  Thanks.

## 2022-11-24 NOTE — Assessment & Plan Note (Signed)
Reasonable to use Claritin but not Claritin-D.

## 2022-11-24 NOTE — Telephone Encounter (Signed)
Called and spoke with patients wife and she is confused about his medication (creon) and the drink he is doing (Nutren). She stated that the directions for the creon says to take 6 times a day with a meal and the drink but he drinks the Nutren 5 times a day. She wants clarification on what needs to be done. She also wanted to report that patient was feeling light headed this morning in the bathroom doing some OT but is okay now. States she does not have any way to take any vitals on him.

## 2022-11-26 ENCOUNTER — Telehealth: Payer: Self-pay | Admitting: Family Medicine

## 2022-11-26 MED ORDER — DEBROX 6.5 % OT SOLN: 5.0000 [drp] | Freq: Every day | OTIC | 2 refills | Status: DC | PRN

## 2022-11-26 NOTE — Telephone Encounter (Signed)
Would flush with  36m (2 oz) of warm water twice a day.  Rx sent for debrox.  Thanks.

## 2022-11-26 NOTE — Telephone Encounter (Signed)
I will be calling patients wife in a bit about results but HH called and is asking about flushing his tube. Amy from [facility] called and stated they can flush it but need to know how many CC's and instructions for the flushing. Amy also stated that patients ears are stopped up and he is wanting another rx for debrox that he has had in the past. He told Amy this really helps. Amy call back number is [phone]

## 2022-11-26 NOTE — Telephone Encounter (Signed)
Whitney from [facility] has been notified about tube flushing and patient notified about results and rx that was sent in.

## 2022-11-26 NOTE — Telephone Encounter (Signed)
Pt's wife, called asking for a call back with pt's lab results. Call back # [phone]

## 2022-11-30 ENCOUNTER — Telehealth: Payer: Self-pay | Admitting: Family Medicine

## 2022-11-30 DIAGNOSIS — H6120 Impacted cerumen, unspecified ear: Secondary | ICD-10-CM

## 2022-11-30 NOTE — Telephone Encounter (Signed)
Patient would like to get a referral for his ears filling full all the time. He uses debrox off and on but only helps some and comes back after some time.

## 2022-11-30 NOTE — Telephone Encounter (Signed)
Patient called and stated he wants a referral to an ear, nose and throat doctor because of his ear problems. Call back number [phone].

## 2022-12-01 ENCOUNTER — Telehealth: Payer: Self-pay | Admitting: Family Medicine

## 2022-12-01 NOTE — Telephone Encounter (Signed)
Patient wife called to see about the referral and requested a call back to her number [phone].

## 2022-12-01 NOTE — Telephone Encounter (Signed)
Home Health verbal orders Caller Name: [place] Name: [facility]  Callback number: [phone]  Requesting Skilled nursing  Frequency: 1x a week for 1 wk/ 2x a week for 1 wk/ 1x a week for 2 wks  Please forward to [facility] pool or providers CMA

## 2022-12-01 NOTE — Telephone Encounter (Signed)
I put in the referral.  If he can't get seen soon at ENT and wants me to work on cerumen impaction, please schedule.  Thanks.

## 2022-12-01 NOTE — Telephone Encounter (Signed)
Spoke with wife and advised that message has been sent to Dr. Damita Dunnings.

## 2022-12-01 NOTE — Telephone Encounter (Addendum)
Please give the order.  Thanks.   

## 2022-12-01 NOTE — Telephone Encounter (Signed)
Verbal orders given to [place] at [facility]

## 2022-12-01 NOTE — Addendum Note (Signed)
Addended by: Tonia Ghent on: 12/01/2022 02:11 PM   Modules accepted: Orders

## 2022-12-02 NOTE — Telephone Encounter (Signed)
Notified patients wife that referral has been done. Advised if they can not be seen soon then to make an appt here for his ears.

## 2022-12-10 ENCOUNTER — Encounter: Payer: Self-pay | Admitting: *Deleted

## 2022-12-16 ENCOUNTER — Other Ambulatory Visit: Payer: Self-pay | Admitting: Family Medicine

## 2022-12-17 ENCOUNTER — Telehealth: Payer: Self-pay | Admitting: Family Medicine

## 2022-12-17 NOTE — Telephone Encounter (Signed)
Deanna called form [facility] called and stated patient has been discharged and his feeding tube is clogged. She wants to know if Dr. Damita Dunnings will give them order to go back and assist with that. Call back number [phone].

## 2022-12-21 NOTE — Telephone Encounter (Addendum)
Yes please have Xavier Esparza go back to assist with clogged feeding tube.

## 2022-12-21 NOTE — Telephone Encounter (Signed)
Verbal orders given via telephone for [facility] to assist with clogged feeding tube per Dr. Danise Mina.

## 2022-12-23 ENCOUNTER — Ambulatory Visit: Payer: Medicare Other | Admitting: Family Medicine

## 2022-12-23 NOTE — Telephone Encounter (Signed)
Thanks

## 2022-12-24 ENCOUNTER — Telehealth: Payer: Self-pay | Admitting: Family Medicine

## 2022-12-24 NOTE — Telephone Encounter (Signed)
Home Health verbal orders [place] Agency Name: [facility]  Callback number:   Requesting OT/PT/Skilled nursing/Social Work/Speech: PT  Reason:continue of deep training, stair training ,balance ,cardiovascular endurance  Frequency:twice wk for 2 wks, once wk for 2 more wks  Please forward to [facility] pool or providers CMA

## 2022-12-24 NOTE — Telephone Encounter (Signed)
Verbal orders given to Tish she will call if any further questions.

## 2022-12-24 NOTE — Telephone Encounter (Signed)
Please give the order.  Thanks.   

## 2022-12-30 ENCOUNTER — Encounter: Payer: Self-pay | Admitting: Family Medicine

## 2022-12-30 ENCOUNTER — Ambulatory Visit (INDEPENDENT_AMBULATORY_CARE_PROVIDER_SITE_OTHER): Payer: Medicare Other | Admitting: Family Medicine

## 2022-12-30 VITALS — BP 112/64 | HR 68 | Temp 98.0°F | Ht 72.0 in | Wt 178.0 lb

## 2022-12-30 DIAGNOSIS — D649 Anemia, unspecified: Secondary | ICD-10-CM

## 2022-12-30 DIAGNOSIS — K8591 Acute pancreatitis with uninfected necrosis, unspecified: Secondary | ICD-10-CM

## 2022-12-30 DIAGNOSIS — H6990 Unspecified Eustachian tube disorder, unspecified ear: Secondary | ICD-10-CM | POA: Diagnosis not present

## 2022-12-30 MED ORDER — LORATADINE 10 MG PO TABS: 10.0000 mg | ORAL_TABLET | Freq: Every day | ORAL | Status: DC | PRN

## 2022-12-30 MED ORDER — FLUTICASONE PROPIONATE 50 MCG/ACT NA SUSP: 2.0000 | Freq: Every day | NASAL | Status: DC

## 2022-12-30 NOTE — Patient Instructions (Addendum)
Tell the GI clinic tomorrow you want to get the tube out.  Go to the lab on the way out.   If you have mychart we'll likely use that to update you.    Take care.  Glad to see you. I need to update GI.  Add on flonase and keep the appointment with Dr. Constance Holster.

## 2022-12-30 NOTE — Progress Notes (Signed)
Weight is up in the meantime and that is a good change.  He is exercising, done with rehab in the meantime but will continue his routine.  He is taking all meds and fluids and solids by mouth.  He is flushing the G-tube daily.  He wants to get the tube out when possible.  Discussed.  He still taking Creon.  D/w pt about echo from 12/21/22.    Taking claritin.  Still with ear popping.  He is going to see Dr. Constance Holster.  No fevers.  Ears feel muffled.    History of anemia.  Recheck labs pending.  See notes on labs.  Meds, vitals, and allergies reviewed.   ROS: Per HPI unless specifically indicated in ROS section   GEN: nad, alert and oriented HEENT: mucous membranes moist, TM without erythema bilaterally NECK: supple w/o LA CV: rrr.  PULM: ctab, no inc wob ABD: soft, +bs, G-tube in place with port site clean and intact EXT: no edema SKIN: no acute rash  30 minutes were devoted to patient care in this encounter (this includes time spent reviewing the patient's file/history, interviewing and examining the patient, counseling/reviewing plan with patient).

## 2022-12-31 ENCOUNTER — Ambulatory Visit: Payer: Medicare Other | Admitting: Family Medicine

## 2022-12-31 LAB — COMPREHENSIVE METABOLIC PANEL
ALT: 12 U/L (ref 0–53)
AST: 17 U/L (ref 0–37)
Albumin: 3.5 g/dL (ref 3.5–5.2)
Alkaline Phosphatase: 51 U/L (ref 39–117)
BUN: 38 mg/dL — ABNORMAL HIGH (ref 6–23)
CO2: 30 mEq/L (ref 19–32)
Calcium: 9.7 mg/dL (ref 8.4–10.5)
Chloride: 105 mEq/L (ref 96–112)
Creatinine, Ser: 1.12 mg/dL (ref 0.40–1.50)
GFR: 63.26 mL/min (ref >60.00)
Glucose, Bld: 90 mg/dL (ref 70–99)
Potassium: 4.9 mEq/L (ref 3.5–5.1)
Sodium: 140 mEq/L (ref 135–145)
Total Bilirubin: 0.3 mg/dL (ref 0.2–1.2)
Total Protein: 6.8 g/dL (ref 6.0–8.3)

## 2022-12-31 LAB — CBC WITH DIFFERENTIAL/PLATELET
Basophils Absolute: 0.1 10*3/uL (ref 0.0–0.1)
Basophils Relative: 0.9 % (ref 0.0–3.0)
Eosinophils Absolute: 0.2 10*3/uL (ref 0.0–0.7)
Eosinophils Relative: 2.8 % (ref 0.0–5.0)
HCT: 32.6 % — ABNORMAL LOW (ref 39.0–52.0)
Hemoglobin: 10.6 g/dL — ABNORMAL LOW (ref 13.0–17.0)
Lymphocytes Relative: 34.5 % (ref 12.0–46.0)
Lymphs Abs: 2.9 10*3/uL (ref 0.7–4.0)
MCHC: 32.5 g/dL (ref 30.0–36.0)
MCV: 88.4 fl (ref 78.0–100.0)
Monocytes Absolute: 0.7 10*3/uL (ref 0.1–1.0)
Monocytes Relative: 8.6 % (ref 3.0–12.0)
Neutro Abs: 4.5 10*3/uL (ref 1.4–7.7)
Neutrophils Relative %: 53.2 % (ref 43.0–77.0)
Platelets: 263 10*3/uL (ref 150.0–400.0)
RBC: 3.69 Mil/uL — ABNORMAL LOW (ref 4.22–5.81)
RDW: 16.8 % — ABNORMAL HIGH (ref 11.5–15.5)
WBC: 8.5 10*3/uL (ref 4.0–10.5)

## 2022-12-31 LAB — IRON: Iron: 63 ug/dL (ref 42–165)

## 2022-12-31 LAB — FERRITIN: Ferritin: 862.7 ng/mL — ABNORMAL HIGH (ref 22.0–322.0)

## 2023-01-01 DIAGNOSIS — D649 Anemia, unspecified: Secondary | ICD-10-CM | POA: Insufficient documentation

## 2023-01-01 DIAGNOSIS — H6990 Unspecified Eustachian tube disorder, unspecified ear: Secondary | ICD-10-CM | POA: Insufficient documentation

## 2023-01-01 NOTE — Assessment & Plan Note (Signed)
See notes on repeat labs.

## 2023-01-01 NOTE — Assessment & Plan Note (Signed)
He is improving in the meantime.  Continue Creon for now.  Continue protein supplement.  He is taking food and medicine by mouth.  Continue with G-tube flush per protocol.  He is going to check with the GI clinic about getting that removed.  I will update the GI clinic with this note.  I appreciate the help of all involved.  We talked about following him clinically to see when he will be able to attempt a trial off Creon.  I do not know how much of that he is going to need in the future, or if he will need it at all.  Discussed.  I would not stop the medication yet.

## 2023-01-01 NOTE — Assessment & Plan Note (Signed)
Likely eustachian tube dysfunction.  Anatomy discussed with patient.  Continue Claritin and add on Flonase.  Keep his follow-up appointment with ENT.

## 2023-01-03 ENCOUNTER — Telehealth: Payer: Self-pay | Admitting: Family Medicine

## 2023-01-03 ENCOUNTER — Ambulatory Visit: Payer: Medicare Other | Admitting: Family Medicine

## 2023-01-03 NOTE — Telephone Encounter (Signed)
Pt's wife, Reginold Agent, called asking for pt's lab results. Told Josephine, Duncan's response to lab results. No questions or concerns. Call back # [phone]

## 2023-01-05 ENCOUNTER — Telehealth: Payer: Self-pay | Admitting: Family Medicine

## 2023-01-05 NOTE — Telephone Encounter (Signed)
I will defer to the [facility] about medication cessation orders.  I would not expect him to need to hold any medications other than his blood thinner as directed by [facility].  Thanks.

## 2023-01-05 NOTE — Telephone Encounter (Signed)
Patient is schedule for a procedure on Friday 01/07/23 at [facility]. He's going to be off of his blood thinner 1-10 days before procedure. She would like to know does he have to discontinue his other medications as well?  Cb#[phone]

## 2023-01-05 NOTE — Telephone Encounter (Signed)
Patients wife notified, they will discuss further with [facility] about medication cessation

## 2023-01-10 ENCOUNTER — Telehealth: Payer: Self-pay

## 2023-01-10 ENCOUNTER — Ambulatory Visit (INDEPENDENT_AMBULATORY_CARE_PROVIDER_SITE_OTHER): Payer: Medicare Other

## 2023-01-10 VITALS — Ht 72.0 in | Wt 178.0 lb

## 2023-01-10 DIAGNOSIS — Z0000 Encounter for general adult medical examination without abnormal findings: Secondary | ICD-10-CM | POA: Diagnosis not present

## 2023-01-10 NOTE — Progress Notes (Signed)
Subjective:   Xavier Esparza is a 77 y.o. male who presents for Medicare Annual/Subsequent preventive examination.  Review of Systems    No ROS.  Medicare Wellness Virtual Visit.  Visual/audio telehealth visit, UTA vital signs.   See social history for additional risk factors.   Cardiac Risk Factors include: advanced age (>89mn, >>28women);male gender     Objective:    Today's Vitals   01/10/23 1001  Weight: 178 lb (80.7 kg)  Height: 6' (1.829 m)   Body mass index is 24.14 kg/m.     01/10/2023   10:10 AM 06/20/2022    5:00 AM 06/08/2022   10:49 AM 01/07/2022    3:40 PM 03/12/2021    7:09 AM 05/28/2020    8:14 AM 05/18/2019   10:43 AM  Advanced Directives  Does Patient Have a Medical Advance Directive? Yes No No No No No Yes  Type of AAutomotive engineerof AHometownLiving will  Copy of H[place] Chart? No - copy requested      No - copy requested  Would patient like information on creating a medical advance directive?  No - Patient declined  Yes (MAU/Ambulatory/Procedural Areas - Information given) No - Patient declined No - Patient declined     Current Medications (verified) Outpatient Encounter Medications as of 01/10/2023  Medication Sig   acyclovir (ZOVIRAX) 800 MG tablet TAKE 1 TABLET DAILY   apixaban (ELIQUIS) 5 MG TABS tablet Take 1 tablet (5 mg total) by mouth 2 (two) times daily.   carbamide peroxide (DEBROX) 6.5 % OTIC solution Place 5 drops into both ears daily as needed (then rinse).   Cholecalciferol (VITAMIN D3) 25 MCG (1000 UT) CAPS Take 1 capsule (1,000 Units total) by mouth daily.   fluticasone (FLONASE) 50 MCG/ACT nasal spray Place 2 sprays into both nostrils daily.   loratadine (CLARITIN) 10 MG tablet Take 1 tablet (10 mg total) by mouth daily as needed for allergies.   Melatonin 10 MG CAPS Take 10 mg by mouth at bedtime as needed.   Nutritional Supplements (NUTREN 1.5) LIQD Take 215 mLs  by mouth 5 (five) times daily.   Pancrelipase, Lip-Prot-Amyl, (CREON) 24000-76000 units CPEP Take 1 tab with all meals and nutren.  6 per day.   rosuvastatin (CRESTOR) 20 MG tablet Take 1 tablet (20 mg total) by mouth every other day.   No facility-administered encounter medications on file as of 01/10/2023.    Allergies (verified) Patient has no known allergies.   History: Past Medical History:  Diagnosis Date   Adopted    Cataract    resolved with surgery   Diabetes mellitus    Dyslipidemia    Erectile dysfunction    Herpes labialis    Upper Lip   Hyperlipidemia    Male hypogonadism    [place]state cancer ([place]    Renal stones    Thyroid nodule    Right   Past Surgical History:  Procedure Laterality Date   CATARACT EXTRACTION, BILATERAL  2017   Dr. GKaty Fitch  COLONOSCOPY WITH PROPOFOL N/A 03/09/2018   Procedure: COLONOSCOPY WITH PROPOFOL;  Surgeon: VLin Landsman MD;  [facility]  Service: Gastroenterology;  Laterality: N/A;   COLONOSCOPY WITH PROPOFOL N/A 03/12/2021   Procedure: COLONOSCOPY WITH PROPOFOL;  Surgeon: VLin Landsman MD;  [facility]  Service: Endoscopy;  Laterality: N/A;  priority 4   CYSTOSCOPY W/ URETERAL STENT  PLACEMENT  08/09/2017   Procedure: CYSTOSCOPY WITH RETROGRADE PYELOGRAM/URETERAL STENT PLACEMENT;  Surgeon: Royston Cowper, MD;  Location: [facility];  Service: Urology;;   DIALYSIS/PERMA CATHETER INSERTION N/A 06/25/2022   Procedure: DIALYSIS/PERMA CATHETER INSERTION;  Surgeon: Algernon Huxley, MD;  Location: [facility];  Service: Cardiovascular;  Laterality: N/A;   ESOPHAGOGASTRODUODENOSCOPY (EGD) WITH PROPOFOL N/A 06/18/2022   Procedure: ESOPHAGOGASTRODUODENOSCOPY (EGD) WITH PROPOFOL;  Surgeon: Jonathon Bellows, MD;  Location: [facility]COPY;  Service: Gastroenterology;  Laterality: N/A;   EXTRACORPOREAL SHOCK WAVE LITHOTRIPSY Left 09/15/2017   Procedure: EXTRACORPOREAL SHOCK WAVE LITHOTRIPSY (ESWL);  Surgeon:  Royston Cowper, MD;  Location: [facility];  Service: Urology;  Laterality: Left;   EYE SURGERY Bilateral Mar 19, 2015   cataract extractions   GREEN LIGHT LASER TURP (TRANSURETHRAL RESECTION OF PROSTATE N/A 04/20/2016   Procedure: GREEN LIGHT LASER TURP (TRANSURETHRAL RESECTION OF PROSTATE;  Surgeon: Royston Cowper, MD;  Location: [facility];  Service: Urology;  Laterality: N/A;   INGUINAL HERNIA REPAIR  01/11/2012   Procedure: HERNIA REPAIR INGUINAL ADULT;  Surgeon: Pedro Earls, MD;  Location: [facility];  Service: General;  Laterality: Right;  Open right inguinal hernia repair with mesh   POLYPECTOMY  03/12/2021   Procedure: POLYPECTOMY;  Surgeon: Lin Landsman, MD;  Location: [facility];  Service: Endoscopy;;   RADIOACTIVE SEED IMPLANT     PROSTATE   URETEROSCOPY WITH HOLMIUM LASER LITHOTRIPSY Right 08/09/2017   Procedure: URETEROSCOPY WITH HOLMIUM LASER LITHOTRIPSY;  Surgeon: Royston Cowper, MD;  Location: [facility];  Service: Urology;  Laterality: Right;   Family History  Adopted: Yes   Social History   Socioeconomic History   Marital status: Married    Spouse name: Not on file   Number of children: Not on file   Years of education: Not on file   Highest education level: Not on file  Occupational History   Not on file  Tobacco Use   Smoking status: Former    Years: 30.00    Types: Cigarettes    Quit date: 12/27/1990    Years since quitting: 32.0   Smokeless tobacco: Never  Vaping Use   Vaping Use: Never used  Substance and Sexual Activity   Alcohol use: Yes    Comment: Rare Socially   Drug use: No   Sexual activity: Yes  Other Topics Concern   Not on file  Social History Narrative   From [place] then [employer]   Married 1971   2 children alive as of 03-18-17, 1 biological child and one stepchild   One child died 03-18-17   Social Determinants of Health   Financial Resource Strain: Low Risk  (01/10/2023)    Overall Financial Resource Strain (CARDIA)    Difficulty of Paying Living Expenses: Not hard at all  Food Insecurity: No Food Insecurity (01/10/2023)   Hunger Vital Sign    Worried About Running Out of Food in the Last Year: Never true    [place] in the Last Year: Never true  Transportation Needs: No Transportation Needs (01/10/2023)   PRAPARE - Hydrologist (Medical): No    Lack of Transportation (Non-Medical): No  Physical Activity: Insufficiently Active (01/07/2022)   Exercise Vital Sign    Days of Exercise per Week: 3 days    Minutes of Exercise per Session: 30 min  Stress: No Stress Concern Present (01/10/2023)  [employer] [place] Questionnaire    Feeling of Stress : Not at all  Social Connections: Socially Integrated (01/10/2023)   Social Connection and Isolation Panel [NHANES]    Frequency of Communication with Friends and Family: More than three times a week    Frequency of Social Gatherings with Friends and Family: More than three times a week    Attends Religious Services: More than 4 times per year    Active Member of [employer] or Organizations: Yes    Attends Music therapist: More than 4 times per year    Marital Status: Married    Tobacco Counseling Counseling given: Not Answered   Clinical Intake:  Pre-visit preparation completed: Yes        Diabetes:  (Followed by PCP)  How often do you need to have someone help you when you read instructions, pamphlets, or other written materials from your doctor or pharmacy?: 1 - Never    Interpreter Needed?: No      Activities of Daily Living    01/10/2023   10:02 AM 06/20/2022    5:00 AM  In your present state of health, do you have any difficulty performing the following activities:  Hearing?  0  Comment Followed by pcp for some hearing changes   Vision? 0 0  Difficulty concentrating or making decisions? 0 1  Walking or  climbing stairs? 1 1  Comment Walker in use outside of the home. Paces self with activity   Dressing or bathing? 0 1  Doing errands, shopping? 1 1  Comment Family Diplomatic Services operational officer and eating ? N   Using the Toilet? N   In the past six months, have you accidently leaked urine? N   Do you have problems with loss of bowel control? N   Managing your Medications? N   Managing your Finances? N   Housekeeping or managing your Housekeeping? N     Patient Care Team: Tonia Ghent, MD as PCP - General (Family Medicine) Royston Cowper, MD as Consulting Physician (Urology) Glee Arvin, PA-C (Gastroenterology)  Indicate any recent Medical Services you may have received from other than [facility] providers in the past year (date may be approximate).     Assessment:   This is a routine wellness examination for Fernando.  I connected with  Amado Nash on 01/10/23 by a audio enabled telemedicine application and verified that I am speaking with the correct person using two identifiers.  Patient Location: Home  Provider Location: Office/Clinic  I discussed the limitations of evaluation and management by telemedicine. The patient expressed understanding and agreed to proceed.   Hearing/Vision screen Hearing Screening - Comments:: Patient has some difficulty hearing. Audiology deferred per patient preference. Vision Screening - Comments:: Followed by [facility] Wears corrective lenses Cataract extraction, bilateral They have seen their ophthalmologist in the last 12 months.    Dietary issues and exercise activities discussed: Current Exercise Habits: Home exercise routine, Type of exercise: walking, Frequency (Times/Week): 3, Intensity: Mild   Goals Addressed               This Visit's Progress     Patient Stated     Increase physical activity (pt-stated)        Walk more (up to 1 mile) for exercise/strengthening        Depression Screen    01/10/2023    10:01 AM 01/07/2022    3:43 PM 12/04/2020  8:35 AM 05/28/2020    8:15 AM 05/18/2019   10:10 AM 05/11/2018   10:38 AM 08/02/2017    9:30 AM  PHQ 2/9 Scores  PHQ - 2 Score 0 0 0 0 0 0 0  PHQ- 9 Score    0 0 0     Fall Risk    01/10/2023   10:14 AM 01/07/2022    3:42 PM 12/04/2020    8:35 AM 05/28/2020    8:14 AM 05/18/2019   10:10 AM  [place] in the past year? 1 0 0 0 0  Comment Fell while hospitalized about 09/2022. No additonal follow up at this time.      Number falls in past yr:  0 0 0   Injury with Fall?  0 0 0   Risk for fall due to : Impaired balance/gait No Fall Risks  Medication side effect   Risk for fall due to: Comment walker in use      Follow up Falls evaluation completed;Falls prevention discussed Falls prevention discussed Falls evaluation completed Falls evaluation completed;Falls prevention discussed     FALL RISK PREVENTION PERTAINING TO THE HOME: Home free of loose throw rugs in walkways, pet beds, electrical cords, etc? Yes  Adequate lighting in your home to reduce risk of falls? Yes   ASSISTIVE DEVICES UTILIZED TO PREVENT FALLS: Life alert? No  Use of a cane, walker or w/c? Yes   TIMED UP AND GO: Was the test performed? No .   Cognitive Function:    05/28/2020    8:17 AM 05/18/2019   10:10 AM 05/11/2018   10:38 AM  MMSE - Mini Mental State Exam  Orientation to time '5 5 5  '$ Orientation to Place '5 5 5  '$ Registration '3 3 3  '$ Attention/ Calculation 5 0 0  Recall '3 3 3  '$ Language- name 2 objects  0 0  Language- repeat '1 1 1  '$ Language- follow 3 step command  0 3  Language- read & follow direction  0 0  Write a sentence  0 0  Copy design  0 0  Total score  17 20        01/10/2023   10:18 AM  6CIT Screen  What Year? 0 points  What month? 0 points  What time? 0 points  Count back from 20 0 points  Months in reverse 0 points  Repeat phrase 0 points  Total Score 0 points    Immunizations Immunization History  Administered Date(s) Administered    Fluad Quad(high Dose 65+) 08/23/2019, 09/18/2020, 10/25/2022   Influenza Split 09/21/2011, 09/21/2012   Influenza Whole 09/14/2010   Influenza, High Dose Seasonal PF 11/30/2016   Influenza,inj,Quad PF,6+ Mos 09/26/2013, 11/08/2014, 11/28/2015, 10/13/2017, 10/12/2018   Influenza-Unspecified 10/09/2021   PFIZER Comirnaty(Gray Top)Covid-19 Tri-Sucrose Vaccine 06/15/2021   PFIZER(Purple Top)SARS-COV-2 Vaccination 02/10/2020, 03/02/2020, 09/26/2020, 10/09/2021   Pneumococcal Conjugate-13 09/26/2013   Pneumococcal Polysaccharide-23 09/17/2010   Td 04/14/2007   Zoster, Live 10/05/2011    TDAP status: Due, Education has been provided regarding the importance of this vaccine. Advised may receive this vaccine at local pharmacy or Health Dept. Aware to provide a copy of the vaccination record if obtained from local pharmacy or Health Dept. Verbalized acceptance and understanding.  Shingrix Completed?: No.    Education has been provided regarding the importance of this vaccine. Patient has been advised to call insurance company to determine out of pocket expense if they have not yet received this vaccine. Advised  may also receive vaccine at local pharmacy or Health Dept. Verbalized acceptance and understanding.  Screening Tests Health Maintenance  Topic Date Due   DTaP/Tdap/Td (2 - Tdap) 04/13/2017   OPHTHALMOLOGY EXAM  11/15/2019   FOOT EXAM  07/07/2022   HEMOGLOBIN A1C  07/08/2022   COVID-19 Vaccine (6 - 2023-24 season) 01/15/2023 (Originally 08/27/2022)   Zoster Vaccines- Shingrix (1 of 2) 04/11/2023 (Originally 04/05/1965)   Medicare Annual Wellness (AWV)  01/11/2024   COLONOSCOPY (Pts 45-34yr Insurance coverage will need to be confirmed)  03/12/2026   Pneumonia Vaccine 77 Years old  Completed   INFLUENZA VACCINE  Completed   Hepatitis C Screening  Completed   HPV VACCINES  Aged Out    Health Maintenance Health Maintenance Due  Topic Date Due   DTaP/Tdap/Td (2 - Tdap) 04/13/2017    OPHTHALMOLOGY EXAM  11/15/2019   FOOT EXAM  07/07/2022   HEMOGLOBIN A1C  07/08/2022   Lung Cancer Screening: (Low Dose CT Chest recommended if Age 78-80years, 30 pack-year currently smoking OR have quit w/in 15years.) does not qualify.   Hepatitis C Screening: Completed 05/2022.   Vision Screening: Recommended annual ophthalmology exams for early detection of glaucoma and other disorders of the eye. Agrees to scheduled eye exam.  Dental Screening: Recommended annual dental exams for proper oral hygiene  Community Resource Referral / Chronic Care Management: CRR required this visit?  No   CCM required this visit?  No      Plan:     I have personally reviewed and noted the following in the patient's chart:   Medical and social history Use of alcohol, tobacco or illicit drugs  Current medications and supplements including opioid prescriptions. Patient is not currently taking opioid prescriptions. Functional ability and status Nutritional status Physical activity Advanced directives List of other physicians Hospitalizations, surgeries, and ER visits in previous 12 months Vitals Screenings to include cognitive, depression, and falls Referrals and appointments  In addition, I have reviewed and discussed with patient certain preventive protocols, quality metrics, and best practice recommendations. A written personalized care plan for preventive services as well as general preventive health recommendations were provided to patient.     DLeta Jungling LPN   10/27/7509

## 2023-01-10 NOTE — Patient Instructions (Addendum)
Xavier Esparza , Thank you for taking time to come for your Medicare Wellness Visit. I appreciate your ongoing commitment to your health goals. Please review the following plan we discussed and let me know if I can assist you in the future.   These are the goals we discussed:  Goals Addressed               This Visit's Progress     Patient Stated     Increase physical activity (pt-stated)        Walk more (up to 1 mile) for exercise/strengthening          This is a list of the screening recommended for you and due dates:  Health Maintenance  Topic Date Due   DTaP/Tdap/Td vaccine (2 - Tdap) 04/13/2017   Eye exam for diabetics  11/15/2019   Complete foot exam   07/07/2022   Hemoglobin A1C  07/08/2022   COVID-19 Vaccine (6 - 2023-24 season) 01/15/2023*   Zoster (Shingles) Vaccine (1 of 2) 04/11/2023*   Medicare Annual Wellness Visit  01/11/2024   Colon Cancer Screening  03/12/2026   Pneumonia Vaccine  Completed   Flu Shot  Completed   Hepatitis C Screening: USPSTF Recommendation to screen - Ages 18-79 yo.  Completed   HPV Vaccine  Aged Out  *Topic was postponed. The date shown is not the original due date.    Advanced directives: End of life planning; Advance aging; Advanced directives discussed.  Copy of current HCPOA/Living Will requested.    Conditions/risks identified: none new  Next appointment: Follow up in one year for your annual wellness visit.   Preventive Care 78 Years and Older, Male  Preventive care refers to lifestyle choices and visits with your health care provider that can promote health and wellness. What does preventive care include? A yearly physical exam. This is also called an annual well check. Dental exams once or twice a year. Routine eye exams. Ask your health care provider how often you should have your eyes checked. Personal lifestyle choices, including: Daily care of your teeth and gums. Regular physical activity. Eating a healthy  diet. Avoiding tobacco and drug use. Limiting alcohol use. Practicing safe sex. Taking low doses of aspirin every day. Taking vitamin and mineral supplements as recommended by your health care provider. What happens during an annual well check? The services and screenings done by your health care provider during your annual well check will depend on your age, overall health, lifestyle risk factors, and family history of disease. Counseling  Your health care provider may ask you questions about your: Alcohol use. Tobacco use. Drug use. Emotional well-being. Home and relationship well-being. Sexual activity. Eating habits. History of falls. Memory and ability to understand (cognition). Work and work Statistician. Screening  You may have the following tests or measurements: Height, weight, and BMI. Blood pressure. Lipid and cholesterol levels. These may be checked every 5 years, or more frequently if you are over 49 years old. Skin check. Lung cancer screening. You may have this screening every year starting at age 78 if you have a 30-pack-year history of smoking and currently smoke or have quit within the past 15 years. Fecal occult blood test (FOBT) of the stool. You may have this test every year starting at age 78 Flexible sigmoidoscopy or colonoscopy. You may have a sigmoidoscopy every 5 years or a colonoscopy every 10 years starting at age 78 Prostate cancer screening. Recommendations will vary depending on your family history  and other risks. Hepatitis C blood test. Hepatitis B blood test. Sexually transmitted disease (STD) testing. Diabetes screening. This is done by checking your blood sugar (glucose) after you have not eaten for a while (fasting). You may have this done every 1-3 years. Abdominal aortic aneurysm (AAA) screening. You may need this if you are a current or former smoker. Osteoporosis. You may be screened starting at age 78 if you are at high risk. Talk with  your health care provider about your test results, treatment options, and if necessary, the need for more tests. Vaccines  Your health care provider may recommend certain vaccines, such as: Influenza vaccine. This is recommended every year. Tetanus, diphtheria, and acellular pertussis (Tdap, Td) vaccine. You may need a Td booster every 10 years. Zoster vaccine. You may need this after age 55. Pneumococcal 13-valent conjugate (PCV13) vaccine. One dose is recommended after age 78 Pneumococcal polysaccharide (PPSV23) vaccine. One dose is recommended after age 78 Talk to your health care provider about which screenings and vaccines you need and how often you need them. This information is not intended to replace advice given to you by your health care provider. Make sure you discuss any questions you have with your health care provider. Document Released: 01/09/2016 Document Revised: 09/01/2016 Document Reviewed: 10/14/2015 Elsevier Interactive Patient Education  2017 [place] Prevention in the Home Falls can cause injuries. They can happen to people of all ages. There are many things you can do to make your home safe and to help prevent falls. What can I do on the outside of my home? Regularly fix the edges of walkways and driveways and fix any cracks. Remove anything that might make you trip as you walk through a door, such as a raised step or threshold. Trim any bushes or trees on the path to your home. Use bright outdoor lighting. Clear any walking paths of anything that might make someone trip, such as rocks or tools. Regularly check to see if handrails are loose or broken. Make sure that both sides of any steps have handrails. Any raised decks and porches should have guardrails on the edges. Have any leaves, snow, or ice cleared regularly. Use sand or salt on walking paths during winter. Clean up any spills in your garage right away. This includes oil or grease spills. What  can I do in the bathroom? Use night lights. Install grab bars by the toilet and in the tub and shower. Do not use towel bars as grab bars. Use non-skid mats or decals in the tub or shower. If you need to sit down in the shower, use a plastic, non-slip stool. Keep the floor dry. Clean up any water that spills on the floor as soon as it happens. Remove soap buildup in the tub or shower regularly. Attach bath mats securely with double-sided non-slip rug tape. Do not have throw rugs and other things on the floor that can make you trip. What can I do in the bedroom? Use night lights. Make sure that you have a light by your bed that is easy to reach. Do not use any sheets or blankets that are too big for your bed. They should not hang down onto the floor. Have a firm chair that has side arms. You can use this for support while you get dressed. Do not have throw rugs and other things on the floor that can make you trip. What can I do in the kitchen? Clean up any spills  right away. Avoid walking on wet floors. Keep items that you use a lot in easy-to-reach places. If you need to reach something above you, use a strong step stool that has a grab bar. Keep electrical cords out of the way. Do not use floor polish or wax that makes floors slippery. If you must use wax, use non-skid floor wax. Do not have throw rugs and other things on the floor that can make you trip. What can I do with my stairs? Do not leave any items on the stairs. Make sure that there are handrails on both sides of the stairs and use them. Fix handrails that are broken or loose. Make sure that handrails are as long as the stairways. Check any carpeting to make sure that it is firmly attached to the stairs. Fix any carpet that is loose or worn. Avoid having throw rugs at the top or bottom of the stairs. If you do have throw rugs, attach them to the floor with carpet tape. Make sure that you have a light switch at the top of the  stairs and the bottom of the stairs. If you do not have them, ask someone to add them for you. What else can I do to help prevent falls? Wear shoes that: Do not have high heels. Have rubber bottoms. Are comfortable and fit you well. Are closed at the toe. Do not wear sandals. If you use a stepladder: Make sure that it is fully opened. Do not climb a closed stepladder. Make sure that both sides of the stepladder are locked into place. Ask someone to hold it for you, if possible. Clearly mark and make sure that you can see: Any grab bars or handrails. First and last steps. Where the edge of each step is. Use tools that help you move around (mobility aids) if they are needed. These include: Canes. Walkers. Scooters. Crutches. Turn on the lights when you go into a dark area. Replace any light bulbs as soon as they burn out. Set up your furniture so you have a clear path. Avoid moving your furniture around. If any of your floors are uneven, fix them. If there are any pets around you, be aware of where they are. Review your medicines with your doctor. Some medicines can make you feel dizzy. This can increase your chance of falling. Ask your doctor what other things that you can do to help prevent falls. This information is not intended to replace advice given to you by your health care provider. Make sure you discuss any questions you have with your health care provider. Document Released: 10/09/2009 Document Revised: 05/20/2016 Document Reviewed: 01/17/2015 Elsevier Interactive Patient Education  2017 [employer].

## 2023-01-10 NOTE — Telephone Encounter (Signed)
No answer when called for scheduled AWV. Left message to call the office back and reschedule.

## 2023-01-11 ENCOUNTER — Telehealth: Payer: Self-pay | Admitting: Family Medicine

## 2023-01-11 NOTE — Telephone Encounter (Signed)
Xavier Esparza from [facility] wanted to let Xavier Esparza know the pt has declined his [facility] services. Pt states he had a change in insurance & would have to pay a deductible if services continued. Xavier Esparza states the pt health had been increasing & been doing well, pt just decided to close the case. Call back # [phone], secured

## 2023-01-12 NOTE — Telephone Encounter (Signed)
Noted. Thanks.

## 2023-01-14 ENCOUNTER — Other Ambulatory Visit: Payer: Self-pay | Admitting: Family Medicine

## 2023-01-26 ENCOUNTER — Telehealth: Payer: Self-pay | Admitting: Family Medicine

## 2023-01-26 ENCOUNTER — Encounter: Payer: Self-pay | Admitting: Family Medicine

## 2023-01-26 DIAGNOSIS — Z8639 Personal history of other endocrine, nutritional and metabolic disease: Secondary | ICD-10-CM

## 2023-01-26 NOTE — Telephone Encounter (Signed)
Patient called in regarding metformin being discontinued from [facility],and he would like to know why the medication will no longer be covered ,and he would like to know what his last A1c reading was,and if it has dropped to indicate that he is no longer prediabetic?

## 2023-01-27 NOTE — Telephone Encounter (Signed)
Patient called and stated that his metformin was denied by [place] when refill was requested by his pharmacy. I advised yes we did refused refill as we do not have you taking this. He wanted to know when he was taken off it it. I scrolled thru chart and found that in note 11/17/22 it was discussed that metformin and other rxs were discontinued. I asked patient when was the last time he took the rx and he said he has not had it since June. Patients last glucose was down to 90; he is concerned about his A1c and wants it checked. His last A1c was checked on 06/30/22 and was 6.5. Patient stated that this puts him in a pre-DM or DM range and wants to know why he shouldn't be on metformin anymore to keep this down?

## 2023-01-28 NOTE — Telephone Encounter (Signed)
Lab appointment made on 01/31/2023 @ 2:00

## 2023-01-28 NOTE — Telephone Encounter (Signed)
We can recheck his A1c.  His sugar has been checked multiple times in the meantime and given his weight loss/illness I do not expect him to need metformin in the meantime.  We can get his A1c and go from there.  Please schedule a lab visit.  Thanks.

## 2023-01-28 NOTE — Telephone Encounter (Signed)
error 

## 2023-01-28 NOTE — Addendum Note (Signed)
Addended by: Tonia Ghent on: 01/28/2023 06:48 AM   Modules accepted: Orders

## 2023-01-31 ENCOUNTER — Other Ambulatory Visit (INDEPENDENT_AMBULATORY_CARE_PROVIDER_SITE_OTHER): Payer: Medicare Other

## 2023-01-31 DIAGNOSIS — Z8639 Personal history of other endocrine, nutritional and metabolic disease: Secondary | ICD-10-CM

## 2023-01-31 LAB — HEMOGLOBIN A1C: Hgb A1c MFr Bld: 5.5 % (ref 4.6–6.5)

## 2023-02-01 ENCOUNTER — Other Ambulatory Visit: Payer: Self-pay | Admitting: Family Medicine

## 2023-02-01 NOTE — Telephone Encounter (Signed)
Refill request Zovirax Last refill 08/19/22 #90/1 Last office visit 12/30/22

## 2023-02-02 NOTE — Telephone Encounter (Signed)
Sent. Thanks.   

## 2023-03-14 ENCOUNTER — Encounter: Payer: Self-pay | Admitting: Family Medicine

## 2023-03-14 ENCOUNTER — Ambulatory Visit (INDEPENDENT_AMBULATORY_CARE_PROVIDER_SITE_OTHER): Payer: Medicare Other | Admitting: Family Medicine

## 2023-03-14 VITALS — BP 108/62 | HR 71 | Temp 97.5°F | Ht 72.0 in | Wt 197.0 lb

## 2023-03-14 DIAGNOSIS — K9423 Gastrostomy malfunction: Secondary | ICD-10-CM

## 2023-03-14 NOTE — Patient Instructions (Addendum)
I will await input from the hematology clinic about your eliquis.    I will update [facility] about the extra tissue to see about options.  Take care.  Glad to see you.

## 2023-03-14 NOTE — Progress Notes (Unsigned)
A1c 5.5 and still on creon. G tube is out.  Discussed that he is not diabetic at this point.  He has radiology f/u pending at [facility] on 03/29/23.  The has [facility] hematology f/u pending after that.    F/u today re: GI tube site.  It hasn't scabbed over and is still oozing some blood-tinged mucous.  Not frankly bloody usually occ with some blood tinge. No abd pain with eating.  No abd pain o/w.  No FCNAVD.    Meds, vitals, and allergies reviewed.   ROS: Per HPI unless specifically indicated in ROS section   Nad Ncat Neck supple, no LA Rrr Ctab Abd soft, not ttp Previous G-tube site with approximately 1 cm of tissue/papule bulging through previous opening.  The tissue could be either granulation tissue or granuloma but it appears to have capillary beds near the surface that occasionally moves a small amount of blood when manipulated.  There is no frank bleeding.  It is not tender to palpation.  No spreading erythema.  No purulent discharge.

## 2023-03-15 ENCOUNTER — Telehealth: Payer: Self-pay | Admitting: Family Medicine

## 2023-03-15 DIAGNOSIS — K9423 Gastrostomy malfunction: Secondary | ICD-10-CM

## 2023-03-15 NOTE — Telephone Encounter (Signed)
Please check with GI clinic at [facility].  See if they can address the patient's issue- he has continued weeping from a papule of tissue that is protruding through the prev G tube site.  This may need resection.  Are they able to arrange for that or does another clinic need to address this?  I didn't want to remove it in clinic with patient on eliquis.  Thanks.

## 2023-03-16 DIAGNOSIS — K9423 Gastrostomy malfunction: Secondary | ICD-10-CM | POA: Insufficient documentation

## 2023-03-16 NOTE — Telephone Encounter (Signed)
Called and spoke with [place] at [place] office. Gave her the message from Dr. Damita Dunnings to see where patient will need to be seen. She is sending over a message to the patients physician but stated he will most likely need to be seen by general surgery for the resection. He currently has an appt on 04/01/23 and they are seeing if his appt can be moved up or stay put. [place] stated that someone will call back once something is known but that the surgery consult should most likely go ahead and be placed. She stated that they can possibly do outside sutures if needed but can not do anything internal.

## 2023-03-16 NOTE — Telephone Encounter (Signed)
Please update patient that I went ahead and put in the [facility] general surgery referral and I will await update from the GI clinic.  Thanks.

## 2023-03-16 NOTE — Assessment & Plan Note (Signed)
This does not look like herniated tissue but it is likely going to need resection and I need input from the [place] clinic.  We may need to have the patient see the surgery clinic.  We will check with the [place] clinic and go from there.  I think it makes sense to keep it bandaged in the meantime.  Patient agrees.  Okay for outpatient follow-up.  It does not appear infected.

## 2023-03-17 NOTE — Telephone Encounter (Signed)
Patient called office gave information will let us know if any issues.

## 2023-03-21 NOTE — Telephone Encounter (Signed)
Spoke with patient and advised him that  GI has not returned a call to [place] yet; not sure if they will as the triage nurse told me that this would most likely need to go thru general surgery. Advised patient that Dr. Damita Dunnings did the referral to general surgery at the recommendation we were given. Patient became frustrated and wanted [facility] number to see if he can get answers to what he needs to do. I can not see where the general surgery referral went to so I could not give patient that information.

## 2023-03-21 NOTE — Telephone Encounter (Signed)
Patient called in stating that he is awaiting a call back regarding what response did Dr Damita Dunnings receive from the [facility] GI  Dr Fransico Setters to what steps they will take next regarding his feeding tube?

## 2023-07-29 ENCOUNTER — Other Ambulatory Visit: Payer: Self-pay | Admitting: Family Medicine

## 2023-07-29 NOTE — Telephone Encounter (Signed)
LOV: 03/14/23 (acute visit)   Last refill: 01/30/23 #90 w/ 1 refill

## 2023-10-02 ENCOUNTER — Other Ambulatory Visit: Payer: Self-pay | Admitting: Family Medicine

## 2023-10-02 DIAGNOSIS — R739 Hyperglycemia, unspecified: Secondary | ICD-10-CM

## 2023-10-02 DIAGNOSIS — D649 Anemia, unspecified: Secondary | ICD-10-CM

## 2023-10-02 DIAGNOSIS — Z8639 Personal history of other endocrine, nutritional and metabolic disease: Secondary | ICD-10-CM

## 2023-10-02 DIAGNOSIS — Z125 Encounter for screening for malignant neoplasm of prostate: Secondary | ICD-10-CM

## 2023-10-05 ENCOUNTER — Other Ambulatory Visit (INDEPENDENT_AMBULATORY_CARE_PROVIDER_SITE_OTHER): Payer: Medicare Other

## 2023-10-05 DIAGNOSIS — Z125 Encounter for screening for malignant neoplasm of prostate: Secondary | ICD-10-CM

## 2023-10-05 DIAGNOSIS — Z8639 Personal history of other endocrine, nutritional and metabolic disease: Secondary | ICD-10-CM | POA: Diagnosis not present

## 2023-10-05 DIAGNOSIS — D649 Anemia, unspecified: Secondary | ICD-10-CM

## 2023-10-05 DIAGNOSIS — R739 Hyperglycemia, unspecified: Secondary | ICD-10-CM

## 2023-10-05 LAB — CBC WITH DIFFERENTIAL/PLATELET
Basophils Absolute: 0 10*3/uL (ref 0.0–0.1)
Basophils Relative: 0.5 % (ref 0.0–3.0)
Eosinophils Absolute: 0.1 10*3/uL (ref 0.0–0.7)
Eosinophils Relative: 1.8 % (ref 0.0–5.0)
HCT: 43.3 % (ref 39.0–52.0)
Hemoglobin: 13.6 g/dL (ref 13.0–17.0)
Lymphocytes Relative: 36.1 % (ref 12.0–46.0)
Lymphs Abs: 2.1 10*3/uL (ref 0.7–4.0)
MCHC: 31.5 g/dL (ref 30.0–36.0)
MCV: 87.2 fL (ref 78.0–100.0)
Monocytes Absolute: 0.5 10*3/uL (ref 0.1–1.0)
Monocytes Relative: 8.9 % (ref 3.0–12.0)
Neutro Abs: 3 10*3/uL (ref 1.4–7.7)
Neutrophils Relative %: 52.7 % (ref 43.0–77.0)
Platelets: 225 10*3/uL (ref 150.0–400.0)
RBC: 4.97 Mil/uL (ref 4.22–5.81)
RDW: 15.9 % — ABNORMAL HIGH (ref 11.5–15.5)
WBC: 5.7 10*3/uL (ref 4.0–10.5)

## 2023-10-05 LAB — LIPID PANEL
Cholesterol: 167 mg/dL (ref 0–200)
HDL: 41.6 mg/dL (ref >39.00)
LDL Cholesterol: 82 mg/dL (ref 0–99)
NonHDL: 125.12
Total CHOL/HDL Ratio: 4
Triglycerides: 215 mg/dL — ABNORMAL HIGH (ref 0.0–149.0)
VLDL: 43 mg/dL — ABNORMAL HIGH (ref 0.0–40.0)

## 2023-10-05 LAB — COMPREHENSIVE METABOLIC PANEL
ALT: 12 U/L (ref 0–53)
AST: 19 U/L (ref 0–37)
Albumin: 4.1 g/dL (ref 3.5–5.2)
Alkaline Phosphatase: 46 U/L (ref 39–117)
BUN: 24 mg/dL — ABNORMAL HIGH (ref 6–23)
CO2: 31 meq/L (ref 19–32)
Calcium: 9.9 mg/dL (ref 8.4–10.5)
Chloride: 103 meq/L (ref 96–112)
Creatinine, Ser: 1.16 mg/dL (ref 0.40–1.50)
GFR: 60.33 mL/min (ref >60.00)
Glucose, Bld: 111 mg/dL — ABNORMAL HIGH (ref 70–99)
Potassium: 4.8 meq/L (ref 3.5–5.1)
Sodium: 140 meq/L (ref 135–145)
Total Bilirubin: 0.7 mg/dL (ref 0.2–1.2)
Total Protein: 6.5 g/dL (ref 6.0–8.3)

## 2023-10-05 LAB — FERRITIN: Ferritin: 491.2 ng/mL — ABNORMAL HIGH (ref 22.0–322.0)

## 2023-10-05 LAB — VITAMIN B12: Vitamin B-12: 300 pg/mL (ref 211–911)

## 2023-10-05 LAB — PSA, MEDICARE: PSA: 0.05 ng/mL — ABNORMAL LOW (ref 0.10–4.00)

## 2023-10-05 LAB — IRON: Iron: 111 ug/dL (ref 42–165)

## 2023-10-05 LAB — HEMOGLOBIN A1C: Hgb A1c MFr Bld: 6.4 % (ref 4.6–6.5)

## 2023-10-13 ENCOUNTER — Encounter: Payer: Self-pay | Admitting: Family Medicine

## 2023-10-13 ENCOUNTER — Ambulatory Visit: Payer: Medicare Other | Admitting: Family Medicine

## 2023-10-13 VITALS — BP 110/62 | HR 56 | Temp 98.3°F | Ht 72.0 in | Wt 215.2 lb

## 2023-10-13 DIAGNOSIS — R739 Hyperglycemia, unspecified: Secondary | ICD-10-CM

## 2023-10-13 DIAGNOSIS — Z8546 Personal history of malignant neoplasm of prostate: Secondary | ICD-10-CM

## 2023-10-13 DIAGNOSIS — Z7189 Other specified counseling: Secondary | ICD-10-CM

## 2023-10-13 DIAGNOSIS — I639 Cerebral infarction, unspecified: Secondary | ICD-10-CM

## 2023-10-13 DIAGNOSIS — Z0000 Encounter for general adult medical examination without abnormal findings: Secondary | ICD-10-CM

## 2023-10-13 MED ORDER — ROSUVASTATIN CALCIUM 20 MG PO TABS: 20.0000 mg | ORAL_TABLET | ORAL | 3 refills | Status: DC

## 2023-10-13 MED ORDER — FLUTICASONE PROPIONATE 50 MCG/ACT NA SUSP: 2.0000 | Freq: Every day | NASAL | Status: AC | PRN

## 2023-10-13 NOTE — Assessment & Plan Note (Signed)
History of, would continue Crestor as is.  He is working on diet and exercise.

## 2023-10-13 NOTE — Assessment & Plan Note (Signed)
Tetanus d/w pt.  RSV vaccine d/w pt.  Shingles d/w pt.   Flu 2024 PNA up to date.  covid vaccine 2024 PSA 0.05 in 2024.  Wife designated if patient were incapacitated.  He would have [place] designated next if his wife were incapacitated.   Colonoscopy 2022

## 2023-10-13 NOTE — Assessment & Plan Note (Signed)
Wife designated if patient were incapacitated.  He would have [place] designated next if his wife were incapacitated.

## 2023-10-13 NOTE — Progress Notes (Signed)
H/o DM2.  A1c 6.4.  No meds currently.  D/w pt about recheck A1c in about 6 months.  He has history of pancreatitis and is currently on Creon with pending testing at [facility] to see if he will need to continue Creon.  Discussed that while his endocrine and exocrine pancreatic function may be impaired, he may have enough function to where he would not need extra medication at this point.  Discussed that I will await update from the clinic.  D/w pt about urology referral.  H/o prostate cancer and nocturia.    Elevated Cholesterol/history of CVA Using medications without problems: yes Muscle aches: no Diet compliance: d/w pt.   Exercise: d/w pt.   Labs discussed with patient.  Tetanus d/w pt.  RSV vaccine d/w pt.  Shingles d/w pt.   Flu 2024 PNA up to date.  covid vaccine 2024 PSA 0.05 in 2024.  Wife designated if patient were incapacitated.  He would have [place] designated next if his wife were incapacitated.   Colonoscopy 2022  Recent imaging d/w pt: 1.  Sequela of chronic pancreatitis with minimal residual peripancreatic  tail fluid, unchanged since 03/29/23. No discrete drainable peripancreatic collection identified. Mild ductal irregularity in the tail likely secondary to mild stricture.  2.  Improved/resolved partially imaged collection along the right paracolic gutter.  3. Gastroepiploic arterial aneurysm measuring 9 mm.   Discussed that his arterial aneurysm can be measured on follow-up imaging at [facility] clinic.  He had labs pending at [facility] re: potentially stopping Creon.  He is still on med in the meantime.    PMH and SH reviewed  Meds, vitals, and allergies reviewed.   ROS: Per HPI unless specifically indicated in ROS section   GEN: nad, alert and oriented HEENT: ncat NECK: supple w/o LA CV: rrr. PULM: ctab, no inc wob ABD: soft, +bs, previous G-tube site is healed. EXT: no edema SKIN: no acute rash

## 2023-10-13 NOTE — Patient Instructions (Addendum)
Check on Tdap and RSV at pharmacy.   Update me as needed.  Take care.  Glad to see you.  Recheck in about 6 months with labs at the visit.

## 2023-10-13 NOTE — Assessment & Plan Note (Addendum)
A1c 6.4.  No meds currently.  D/w pt about recheck A1c in about 6 months.  He has history of pancreatitis and is currently on Creon with pending testing at [facility] to see if he will need to continue Creon.  Discussed that while his endocrine and exocrine pancreatic function may be impaired, he may have enough function to where he would not need extra medication at this point.  Discussed that I will await update from the clinic.  He is going to continue work on diet and exercise.

## 2023-10-13 NOTE — Assessment & Plan Note (Signed)
PSA not elevated.  Refer back to urology.

## 2024-01-11 ENCOUNTER — Ambulatory Visit: Payer: Medicare Other

## 2024-01-11 VITALS — Ht 73.0 in | Wt 215.0 lb

## 2024-01-11 DIAGNOSIS — Z0000 Encounter for general adult medical examination without abnormal findings: Secondary | ICD-10-CM | POA: Diagnosis not present

## 2024-01-11 NOTE — Progress Notes (Signed)
Subjective:   Xavier Esparza is a 78 y.o. male who presents for Medicare Annual/Subsequent preventive examination.  Visit Complete: Virtual I connected with  Betsey Holiday on 01/11/24 by a audio enabled telemedicine application and verified that I am speaking with the correct person using two identifiers.  Patient Location: Home  Provider Location: Home Office  I discussed the limitations of evaluation and management by telemedicine. The patient expressed understanding and agreed to proceed.  Vital Signs: Because this visit was a virtual/telehealth visit, some criteria may be missing or patient reported. Any vitals not documented were not able to be obtained and vitals that have been documented are patient reported.  Patient Medicare AWV questionnaire was completed by the patient on (not done); I have confirmed that all information answered by patient is correct and no changes since this date.  Cardiac Risk Factors include: advanced age (>45men, >4 women);dyslipidemia;male gender    Objective:    Today's Vitals   01/11/24 0935  Weight: 215 lb (97.5 kg)  Height: 6\' 1"  (1.854 m)  PainSc: 0-No pain   Body mass index is 28.37 kg/m.     01/11/2024    9:52 AM 01/10/2023   10:10 AM 06/20/2022    5:00 AM 06/08/2022   10:49 AM 01/07/2022    3:40 PM 03/12/2021    7:09 AM 05/28/2020    8:14 AM  Advanced Directives  Does Patient Have a Medical Advance Directive? Yes Yes No No No No No  Type of Sales promotion account executive of Attorney       Copy of Healthcare Power of Attorney in Chart? No - copy requested No - copy requested       Would patient like information on creating a medical advance directive?   No - Patient declined  Yes (MAU/Ambulatory/Procedural Areas - Information given) No - Patient declined No - Patient declined    Current Medications (verified) Outpatient Encounter Medications as of 01/11/2024  Medication Sig   acyclovir (ZOVIRAX) 800 MG  tablet TAKE 1 TABLET DAILY   Cholecalciferol (VITAMIN D3) 25 MCG (1000 UT) CAPS Take 1 capsule (1,000 Units total) by mouth daily.   fluticasone (FLONASE) 50 MCG/ACT nasal spray Place 2 sprays into both nostrils daily as needed for allergies or rhinitis.   Melatonin 10 MG CAPS Take 10 mg by mouth at bedtime as needed.   Multiple Vitamin (MULTIVITAMIN) capsule Take 1 capsule by mouth daily.   Pancrelipase, Lip-Prot-Amyl, (CREON) 24000-76000 units CPEP Take 1 tab with all meals and nutren.  6 per day.   rosuvastatin (CRESTOR) 20 MG tablet Take 1 tablet (20 mg total) by mouth every other day.   No facility-administered encounter medications on file as of 01/11/2024.    Allergies (verified) Patient has no known allergies.   History: Past Medical History:  Diagnosis Date   Adopted    Cataract    resolved with surgery   Diabetes mellitus    Dyslipidemia    Erectile dysfunction    Herpes labialis    Upper Lip   Hyperlipidemia    Male hypogonadism    Prostate cancer (HCC)    Renal stones    Thyroid nodule    Right   Past Surgical History:  Procedure Laterality Date   CATARACT EXTRACTION, BILATERAL  2017   Dr. Dione Booze   COLONOSCOPY WITH PROPOFOL N/A 03/09/2018   Procedure: COLONOSCOPY WITH PROPOFOL;  Surgeon: Toney Reil, MD;  Location: [facility];  Service: Gastroenterology;  Laterality: N/A;   COLONOSCOPY WITH PROPOFOL N/A 03/12/2021   Procedure: COLONOSCOPY WITH PROPOFOL;  Surgeon: Toney Reil, MD;  Location: [facility];  Service: Endoscopy;  Laterality: N/A;  priority 4   CYSTOSCOPY W/ URETERAL STENT PLACEMENT  08/09/2017   Procedure: CYSTOSCOPY WITH RETROGRADE PYELOGRAM/URETERAL STENT PLACEMENT;  Surgeon: Orson Ape, MD;  Location: [facility] ORS;  Service: Urology;;   DIALYSIS/PERMA CATHETER INSERTION N/A 06/25/2022   Procedure: DIALYSIS/PERMA CATHETER INSERTION;  Surgeon: Annice Needy, MD;  Location: [facility] INVASIVE CV LAB;  Service: Cardiovascular;   Laterality: N/A;   ESOPHAGOGASTRODUODENOSCOPY (EGD) WITH PROPOFOL N/A 06/18/2022   Procedure: ESOPHAGOGASTRODUODENOSCOPY (EGD) WITH PROPOFOL;  Surgeon: Wyline Mood, MD;  Location: [facility] ENDOSCOPY;  Service: Gastroenterology;  Laterality: N/A;   EXTRACORPOREAL SHOCK WAVE LITHOTRIPSY Left 09/15/2017   Procedure: EXTRACORPOREAL SHOCK WAVE LITHOTRIPSY (ESWL);  Surgeon: Orson Ape, MD;  Location: [facility] ORS;  Service: Urology;  Laterality: Left;   EYE SURGERY Bilateral February 07, 2015   cataract extractions   GREEN LIGHT LASER TURP (TRANSURETHRAL RESECTION OF PROSTATE N/A 04/20/2016   Procedure: GREEN LIGHT LASER TURP (TRANSURETHRAL RESECTION OF PROSTATE;  Surgeon: Orson Ape, MD;  Location: [facility] ORS;  Service: Urology;  Laterality: N/A;   INGUINAL HERNIA REPAIR  01/11/2012   Procedure: HERNIA REPAIR INGUINAL ADULT;  Surgeon: Valarie Merino, MD;  Location: [facility];  Service: General;  Laterality: Right;  Open right inguinal hernia repair with mesh   POLYPECTOMY  03/12/2021   Procedure: POLYPECTOMY;  Surgeon: Toney Reil, MD;  Location: [facility];  Service: Endoscopy;;   RADIOACTIVE SEED IMPLANT     PROSTATE   URETEROSCOPY WITH HOLMIUM LASER LITHOTRIPSY Right 08/09/2017   Procedure: URETEROSCOPY WITH HOLMIUM LASER LITHOTRIPSY;  Surgeon: Orson Ape, MD;  Location: [facility] ORS;  Service: Urology;  Laterality: Right;   Family History  Adopted: Yes   Social History   Socioeconomic History   Marital status: Married    Spouse name: Not on file   Number of children: Not on file   Years of education: Not on file   Highest education level: Not on file  Occupational History   Not on file  Tobacco Use   Smoking status: Former    Current packs/day: 0.00    Types: Cigarettes    Start date: 12/27/1960    Quit date: 12/27/1990    Years since quitting: 33.0   Smokeless tobacco: Never  Vaping Use   Vaping status: Never Used  Substance and Sexual Activity   Alcohol use:  Yes    Comment: Rare Socially   Drug use: No   Sexual activity: Yes  Other Topics Concern   Not on file  Social History Narrative   From [employer] sales   Attended [employer] then [employer]   Married 1971   2 children alive as of Feb 07, 2017, 1 biological child and one stepchild   One child died 2017/02/07   Social Drivers of Health   Financial Resource Strain: Low Risk  (01/11/2024)   Overall Financial Resource Strain (CARDIA)    Difficulty of Paying Living Expenses: Not hard at all  Food Insecurity: No Food Insecurity (01/11/2024)   Hunger Vital Sign    Worried About Running Out of Food in the Last Year: Never true    Ran Out of Food in the Last Year: Never true  Transportation Needs: No Transportation Needs (01/11/2024)   PRAPARE - Administrator, Civil Service (Medical):  No    Lack of Transportation (Non-Medical): No  Physical Activity: Sufficiently Active (01/11/2024)   Exercise Vital Sign    Days of Exercise per Week: 3 days    Minutes of Exercise per Session: 60 min  Stress: No Stress Concern Present (01/11/2024)   [employer] of Occupational Health - Occupational Stress Questionnaire    Feeling of Stress : Only a little  Social Connections: Socially Integrated (01/11/2024)   Social Connection and Isolation Panel [NHANES]    Frequency of Communication with Friends and Family: More than three times a week    Frequency of Social Gatherings with Friends and Family: More than three times a week    Attends Religious Services: More than 4 times per year    Active Member of [employer] or Organizations: Yes    Attends Engineer, structural: More than 4 times per year    Marital Status: Married    Tobacco Counseling Counseling given: Not Answered   Clinical Intake:  Pre-visit preparation completed: Yes  Pain : No/denies pain Pain Score: 0-No pain    BMI - recorded: 28.37 Nutritional Status: BMI 25 -29 Overweight Nutritional Risks: None Diabetes:  No  How often do you need to have someone help you when you read instructions, pamphlets, or other written materials from your doctor or pharmacy?: 1 - Never  Interpreter Needed?: No  Comments: lives with wife Information entered by :: B.Avry Roedl,LPN   Activities of Daily Living    01/11/2024    9:53 AM  In your present state of health, do you have any difficulty performing the following activities:  Hearing? 0  Vision? 0  Difficulty concentrating or making decisions? 0  Walking or climbing stairs? 0  Dressing or bathing? 0  Doing errands, shopping? 0  Preparing Food and eating ? N  Using the Toilet? N  In the past six months, have you accidently leaked urine? N  Do you have problems with loss of bowel control? N  Managing your Medications? N  Managing your Finances? N  Housekeeping or managing your Housekeeping? N    Patient Care Team: Joaquim Nam, MD as PCP - General (Family Medicine) Orson Ape, MD as Consulting Physician (Urology) Larey Dresser, PA-C (Gastroenterology) Carman Ching, OD (Optometry)  Indicate any recent Medical Services you may have received from other than [facility] providers in the past year (date may be approximate).     Assessment:   This is a routine wellness examination for Tariq.  Hearing/Vision screen Hearing Screening - Comments:: Pt says ENT visit went well:no hearing aids needed   Vision Screening - Comments:: Pt says his vision is good   Goals Addressed               This Visit's Progress     COMPLETED: Increase physical activity (pt-stated)        Walk more (up to 1 mile) for exercise/strengthening       COMPLETED: Patient Stated   On track     Starting 05/18/19, I will continue to take medication as prescribed.        COMPLETED: Patient Stated   On track     05/28/2020, I will continue to power walk and lift weights 6 days a week for 45 minutes.       COMPLETED: Patient Stated   On track     Would like to lose  weight and keep exercise routine        Depression Screen  01/11/2024    9:48 AM 10/13/2023    2:59 PM 03/14/2023   11:44 AM 01/10/2023   10:01 AM 01/07/2022    3:43 PM 12/04/2020    8:35 AM 05/28/2020    8:15 AM  PHQ 2/9 Scores  PHQ - 2 Score 0 0 0 0 0 0 0  PHQ- 9 Score  0 4    0    Fall Risk    01/11/2024    9:43 AM 10/13/2023    2:59 PM 03/14/2023   11:44 AM 01/10/2023   10:14 AM 01/07/2022    3:42 PM  Fall Risk   Falls in the past year? 0 0 0 1 0  Comment    Fell while hospitalized about 09/2022. No additonal follow up at this time.   Number falls in past yr: 0 0 0  0  Injury with Fall? 0 0 0  0  Risk for fall due to : No Fall Risks No Fall Risks No Fall Risks Impaired balance/gait No Fall Risks  Risk for fall due to: Comment    walker in use   Follow up Falls prevention discussed;Education provided Falls evaluation completed Falls evaluation completed Falls evaluation completed;Falls prevention discussed Falls prevention discussed    MEDICARE RISK AT HOME: Medicare Risk at Home Any stairs in or around the home?: Yes If so, are there any without handrails?: Yes Home free of loose throw rugs in walkways, pet beds, electrical cords, etc?: Yes Adequate lighting in your home to reduce risk of falls?: Yes Life alert?: No Use of a cane, walker or w/c?: No Grab bars in the bathroom?: Yes Shower chair or bench in shower?: No Elevated toilet seat or a handicapped toilet?: Yes  TIMED UP AND GO:  Was the test performed?  No    Cognitive Function:    05/28/2020    8:17 AM 05/18/2019   10:10 AM 05/11/2018   10:38 AM  MMSE - Mini Mental State Exam  Orientation to time 5 5 5   Orientation to Place 5 5 5   Registration 3 3 3   Attention/ Calculation 5 0 0  Recall 3 3 3   Language- name 2 objects  0 0  Language- repeat 1 1 1   Language- follow 3 step command  0 3  Language- read & follow direction  0 0  Write a sentence  0 0  Copy design  0 0  Total score  17 20         01/11/2024    9:55 AM 01/10/2023   10:18 AM  6CIT Screen  What Year? 0 points 0 points  What month? 0 points 0 points  What time? 0 points 0 points  Count back from 20 0 points 0 points  Months in reverse 0 points 0 points  Repeat phrase 0 points 0 points  Total Score 0 points 0 points    Immunizations Immunization History  Administered Date(s) Administered   Fluad Quad(high Dose 65+) 08/23/2019, 09/18/2020, 10/25/2022, 09/12/2023   Influenza Split 09/21/2011, 09/21/2012   Influenza Whole 09/14/2010   Influenza, High Dose Seasonal PF 11/30/2016   Influenza,inj,Quad PF,6+ Mos 09/26/2013, 11/08/2014, 11/28/2015, 10/13/2017, 10/12/2018   Influenza-Unspecified 10/09/2021   PFIZER Comirnaty(Gray Top)Covid-19 Tri-Sucrose Vaccine 06/15/2021   PFIZER(Purple Top)SARS-COV-2 Vaccination 02/10/2020, 03/02/2020, 09/26/2020, 10/09/2021   Pneumococcal Conjugate-13 09/26/2013   Pneumococcal Polysaccharide-23 09/17/2010   Td 04/14/2007   Zoster, Live 10/05/2011    TDAP status: Up to date  Flu Vaccine status: Up to date  Pneumococcal  vaccine status: Up to date  Covid-19 vaccine status: Completed vaccines  Qualifies for Shingles Vaccine? Yes   Zostavax completed No   Shingrix Completed?: No.    Education has been provided regarding the importance of this vaccine. Patient has been advised to call insurance company to determine out of pocket expense if they have not yet received this vaccine. Advised may also receive vaccine at local pharmacy or Health Dept. Verbalized acceptance and understanding.  Screening Tests Health Maintenance  Topic Date Due   Zoster Vaccines- Shingrix (1 of 2) 04/06/1995   DTaP/Tdap/Td (2 - Tdap) 04/13/2017   OPHTHALMOLOGY EXAM  11/15/2019   FOOT EXAM  07/07/2022   Diabetic kidney evaluation - Urine ACR  01/08/2023   COVID-19 Vaccine (6 - 2024-25 season) 08/28/2023   HEMOGLOBIN A1C  04/04/2024   Diabetic kidney evaluation - eGFR measurement  10/04/2024    Medicare Annual Wellness (AWV)  01/10/2025   Colonoscopy  03/12/2026   Pneumonia Vaccine 80+ Years old  Completed   INFLUENZA VACCINE  Completed   Hepatitis C Screening  Completed   HPV VACCINES  Aged Out    Health Maintenance  Health Maintenance Due  Topic Date Due   Zoster Vaccines- Shingrix (1 of 2) 04/06/1995   DTaP/Tdap/Td (2 - Tdap) 04/13/2017   OPHTHALMOLOGY EXAM  11/15/2019   FOOT EXAM  07/07/2022   Diabetic kidney evaluation - Urine ACR  01/08/2023   COVID-19 Vaccine (6 - 2024-25 season) 08/28/2023    Colorectal cancer screening: No longer required.   Lung Cancer Screening: (Low Dose CT Chest recommended if Age 48-80 years, 20 pack-year currently smoking OR have quit w/in 15years.) does not qualify.   Lung Cancer Screening Referral: no  Additional Screening:  Hepatitis C Screening: does not qualify; Completed 06/11/2022  Vision Screening: Recommended annual ophthalmology exams for early detection of glaucoma and other disorders of the eye. Is the patient up to date with their annual eye exam?  Yes  Who is the provider or what is the name of the office in which the patient attends annual eye exams? Dr Rondel Baton If pt is not established with a provider, would they like to be referred to a provider to establish care? No .   Dental Screening: Recommended annual dental exams for proper oral hygiene  Diabetic Foot Exam: n/a  Community Resource Referral / Chronic Care Management: CRR required this visit?  No   CCM required this visit?  No    Plan:     I have personally reviewed and noted the following in the patient's chart:   Medical and social history Use of alcohol, tobacco or illicit drugs  Current medications and supplements including opioid prescriptions. Patient is not currently taking opioid prescriptions. Functional ability and status Nutritional status Physical activity Advanced directives List of other physicians Hospitalizations, surgeries, and  ER visits in previous 12 months Vitals Screenings to include cognitive, depression, and falls Referrals and appointments  In addition, I have reviewed and discussed with patient certain preventive protocols, quality metrics, and best practice recommendations. A written personalized care plan for preventive services as well as general preventive health recommendations were provided to patient.    Sue Lush, LPN   1/61/0960   After Visit Summary: (MyChart) Due to this being a telephonic visit, the after visit summary with patients personalized plan was offered to patient via MyChart   Nurse Notes: The patient states he is doing well. He does express needing to follow up with Neurologist  and he has forgotten who it was recommended. States he does less night driving but still drives.  He has no other concerns or questions at this time.

## 2024-01-11 NOTE — Patient Instructions (Signed)
 Xavier Esparza , Thank you for taking time to come for your Medicare Wellness Visit. I appreciate your ongoing commitment to your health goals. Please review the following plan we discussed and let me know if I can assist you in the future.   Referrals/Orders/Follow-Ups/Clinician Recommendations: none  This is a list of the screening recommended for you and due dates:  Health Maintenance  Topic Date Due   Zoster (Shingles) Vaccine (1 of 2) 04/06/1995   DTaP/Tdap/Td vaccine (2 - Tdap) 04/13/2017   Eye exam for diabetics  11/15/2019   Complete foot exam   07/07/2022   Yearly kidney health urinalysis for diabetes  01/08/2023   COVID-19 Vaccine (6 - 2024-25 season) 08/28/2023   Hemoglobin A1C  04/04/2024   Yearly kidney function blood test for diabetes  10/04/2024   Medicare Annual Wellness Visit  01/10/2025   Colon Cancer Screening  03/12/2026   Pneumonia Vaccine  Completed   Flu Shot  Completed   Hepatitis C Screening  Completed   HPV Vaccine  Aged Out    Advanced directives: (Copy Requested) Please bring a copy of your health care power of attorney and living will to the office to be added to your chart at your convenience.  Next Medicare Annual Wellness Visit scheduled for next year: Yes1/16/2026 @ 9:30am televisit

## 2024-01-15 ENCOUNTER — Telehealth: Payer: Self-pay | Admitting: Family Medicine

## 2024-01-15 DIAGNOSIS — Z8546 Personal history of malignant neoplasm of prostate: Secondary | ICD-10-CM

## 2024-01-15 NOTE — Telephone Encounter (Signed)
Please check with patient.  He was asking about neurology f/u when he had the AMW visit.    Did he mean neurology or urology?  Which issues was he asking about for follow up?    Please let me know.  Thanks.

## 2024-01-16 NOTE — Telephone Encounter (Signed)
Patient was requesting a referral to urology. He would like it to be sent to [facility]. He stated he previously had cancer caught by his previous urologist and would like to continue being seen by urology for general checkups.

## 2024-01-16 NOTE — Telephone Encounter (Signed)
I put in a referral today.  Thanks.

## 2024-01-17 NOTE — Telephone Encounter (Signed)
Called and left a voice message asking patient to give me a call back at the office.

## 2024-01-18 NOTE — Telephone Encounter (Signed)
Left voicemail for patient to return call to office. 

## 2024-01-19 NOTE — Telephone Encounter (Signed)
Left voicemail for patient to return call to office. 

## 2024-01-20 ENCOUNTER — Encounter: Payer: Self-pay | Admitting: Family Medicine

## 2024-01-20 NOTE — Telephone Encounter (Signed)
Several attempts made to contact patient with no success. Letter sent to patient

## 2024-02-29 ENCOUNTER — Ambulatory Visit: Payer: Self-pay | Admitting: Urology

## 2024-04-11 ENCOUNTER — Ambulatory Visit: Payer: Medicare Other | Admitting: Urology

## 2024-05-16 ENCOUNTER — Ambulatory Visit: Admitting: Urology

## 2024-06-25 ENCOUNTER — Ambulatory Visit: Admitting: Urology

## 2024-07-26 ENCOUNTER — Ambulatory Visit (INDEPENDENT_AMBULATORY_CARE_PROVIDER_SITE_OTHER): Admitting: Urology

## 2024-07-26 VITALS — BP 104/61 | HR 79 | Ht 73.0 in | Wt 215.0 lb

## 2024-07-26 DIAGNOSIS — N529 Male erectile dysfunction, unspecified: Secondary | ICD-10-CM

## 2024-07-26 DIAGNOSIS — Z8546 Personal history of malignant neoplasm of prostate: Secondary | ICD-10-CM | POA: Diagnosis not present

## 2024-07-26 DIAGNOSIS — E291 Testicular hypofunction: Secondary | ICD-10-CM | POA: Diagnosis not present

## 2024-07-26 MED ORDER — TADALAFIL 10 MG PO TABS
10.0000 mg | ORAL_TABLET | Freq: Every day | ORAL | 11 refills | Status: AC | PRN
Start: 2024-07-26 — End: ?

## 2024-07-26 NOTE — Progress Notes (Signed)
 07/26/24 11:02 AM   Xavier Esparza 23-Dec-1946 [id number]  CC: Prostate cancer, ED, LUTS, hypogonadism  HPI: 78 year old male previously followed by Dr. Gala and transferring his care for the above issues.  He was treated for low risk prostate cancer in 2018 with brachytherapy and Lupron.  PSA has remained very low since that time, most recently 0.05.  He also has a history of obstructive urinary symptoms and underwent greenlight laser PVP with Dr. Gala in 2017, as well as ureteroscopy in 2018.  Dr. Gala had been following him for low testosterone  in the ED, those medicines were unaffordable and he was not taking any medications.  Has had ED since treatment for prostate cancer.  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  Denies any significant urinary symptoms, nocturia 1-2 times per night.  He is primarily interested in discussing ED and low testosterone .   PMH: Past Medical History:  Diagnosis Date   Adopted    Cataract    resolved with surgery   Diabetes mellitus    Dyslipidemia    Erectile dysfunction    Herpes labialis    Upper Lip   Hyperlipidemia    Male hypogonadism    Prostate cancer (HCC)    Renal stones    Thyroid  nodule    Right    Surgical History: Past Surgical History:  Procedure Laterality Date   CATARACT EXTRACTION, BILATERAL  2017   Dr. Octavia   COLONOSCOPY WITH PROPOFOL  N/A 03/09/2018   Procedure: COLONOSCOPY WITH PROPOFOL ;  Surgeon: Unk Corinn Skiff, MD;  Location: [facility];  Service: Gastroenterology;  Laterality: N/A;   COLONOSCOPY WITH PROPOFOL  N/A 03/12/2021   Procedure: COLONOSCOPY WITH PROPOFOL ;  Surgeon: Unk Corinn Skiff, MD;  Location: [facility];  Service: Endoscopy;  Laterality: N/A;  priority 4   CYSTOSCOPY W/ URETERAL STENT PLACEMENT  08/09/2017   Procedure: CYSTOSCOPY WITH RETROGRADE PYELOGRAM/URETERAL STENT PLACEMENT;  Surgeon: Kassie Ozell SAUNDERS, MD;  Location: [facility];  Service:  Urology;;   DIALYSIS/PERMA CATHETER INSERTION N/A 06/25/2022   Procedure: DIALYSIS/PERMA CATHETER INSERTION;  Surgeon: Marea Selinda RAMAN, MD;  Location: [facility];  Service: Cardiovascular;  Laterality: N/A;   ESOPHAGOGASTRODUODENOSCOPY (EGD) WITH PROPOFOL  N/A 06/18/2022   Procedure: ESOPHAGOGASTRODUODENOSCOPY (EGD) WITH PROPOFOL ;  Surgeon: Therisa Bi, MD;  Location: [facility];  Service: Gastroenterology;  Laterality: N/A;   EXTRACORPOREAL SHOCK WAVE LITHOTRIPSY Left 09/15/2017   Procedure: EXTRACORPOREAL SHOCK WAVE LITHOTRIPSY (ESWL);  Surgeon: Kassie Ozell SAUNDERS, MD;  Location: [facility];  Service: Urology;  Laterality: Left;   EYE SURGERY Bilateral 2016   cataract extractions   GREEN LIGHT LASER TURP (TRANSURETHRAL RESECTION OF PROSTATE N/A 04/20/2016   Procedure: GREEN LIGHT LASER TURP (TRANSURETHRAL RESECTION OF PROSTATE;  Surgeon: Ozell SAUNDERS Kassie, MD;  Location: [facility];  Service: Urology;  Laterality: N/A;   INGUINAL HERNIA REPAIR  01/11/2012   Procedure: HERNIA REPAIR INGUINAL ADULT;  Surgeon: Donnice KATHEE Lunger, MD;  Location: [facility];  Service: General;  Laterality: Right;  Open right inguinal hernia repair with mesh   POLYPECTOMY  03/12/2021   Procedure: POLYPECTOMY;  Surgeon: Unk Corinn Skiff, MD;  Location: [facility];  Service: Endoscopy;;   RADIOACTIVE SEED IMPLANT     PROSTATE   URETEROSCOPY WITH HOLMIUM LASER LITHOTRIPSY Right 08/09/2017   Procedure: URETEROSCOPY WITH HOLMIUM LASER LITHOTRIPSY;  Surgeon: Kassie Ozell SAUNDERS, MD;  Location: [facility];  Service: Urology;  Laterality: Right;     Family History: Family  History  Adopted: Yes    Social History:  reports that he quit smoking about 33 years ago. His smoking use included cigarettes. He started smoking about 63 years ago. He has never used smokeless tobacco. He reports current alcohol use. He reports that he does not use drugs.  Physical Exam: BP 104/61 (BP Location: Left Arm, Patient  Position: Sitting, Cuff Size: Normal)   Pulse 79   Ht 6' 1 (1.854 m)   Wt 215 lb (97.5 kg)   BMI 28.37 kg/m    Constitutional:  Alert and oriented, No acute distress. Cardiovascular: No clubbing, cyanosis, or edema. Respiratory: Normal respiratory effort, no increased work of breathing. GI: Abdomen is soft, nontender, nondistended, no abdominal masses  Laboratory Data: Reviewed, see HPI  Pertinent Imaging: I have personally viewed and interpreted the CT from July 2023 with nonobstructive right renal stones, brachytherapy seeds in the prostate.  Assessment & Plan:   78 year old male previously managed by Dr. Gala, treated for low risk prostate cancer in 2018 with brachytherapy and adjuvant ADT, greenlight laser PVP 2017, history of ureteroscopy.  Has had problems with ED since that time, interested in other options.  Reportedly has a history of hypogonadism but those records are not available.  He also had a very prolonged hospitalization for at least 6 months at [facility] for necrotizing pancreatitis and those notes were reviewed extensively.  I also reviewed the outside notes from Dr. Gala in the media tab.  We reviewed the AUA guidelines regarding hypogonadism and ED, with his low risk disease and low PSA very reasonable to trial testosterone  therapy if indicated, risks and benefits of PDE 5 inhibitors were reviewed.  Testosterone  today, call with results, if low can follow-up with [facility] for repeat T/LH and discuss options Trial of Cialis  10 to 20 mg on demand Continue yearly PSA monitoring for history of prostate cancer treated with brachytherapy, very low risk of recurrence   Redell Burnet, MD 07/26/2024  [facility] [address], [address] [place], [place] [address] [phone]

## 2024-07-26 NOTE — Patient Instructions (Signed)

## 2024-07-27 ENCOUNTER — Ambulatory Visit: Payer: Self-pay | Admitting: Urology

## 2024-07-27 DIAGNOSIS — E291 Testicular hypofunction: Secondary | ICD-10-CM

## 2024-07-27 LAB — TESTOSTERONE: Testosterone: 226 ng/dL — ABNORMAL LOW (ref 264–916)

## 2024-07-27 NOTE — Telephone Encounter (Signed)
Labs ordered. Appt scheduled.  

## 2024-08-15 ENCOUNTER — Other Ambulatory Visit

## 2024-08-15 DIAGNOSIS — E291 Testicular hypofunction: Secondary | ICD-10-CM

## 2024-08-16 LAB — TESTOSTERONE: Testosterone: 220 ng/dL — ABNORMAL LOW (ref 264–916)

## 2024-08-16 LAB — LUTEINIZING HORMONE: LH: 3.7 m[IU]/mL (ref 1.7–8.6)

## 2024-08-20 NOTE — Progress Notes (Unsigned)
 08/21/2024 5:04 PM   Xavier Esparza July 13, 1946 [id number]  Referring provider: Cleatus Arlyss RAMAN, MD [address] [place],  [place] [address]  Urological history: 1. Low risk prostate cancer - PSA (09/2023) 0.05 - brachytherapy and Lupron (2018)   2. BPH with LU TS - Green light PVP (2017)  3. Right ureterocele - incision of ureterocele w/ Holmium laser (2018)   4. ED - tadalafil  20 mg, on-demand-dosing  5. Punctate right renal stone  6. Left renal cyst  No chief complaint on file.  HPI: Xavier Esparza is a 78 y.o. man who presents today for further discussion on TRT.  Previous records reviewed.   He has been having issues with ED and symptoms of hypogonadism.   He has had several testosterone  levels drawn over the last 10 years all resulting below 300, a few of them were morning testosterones.  He has recent luteinizing hormone was 3.7.   His PSA was 0.05 in October 2024.     PMH: Past Medical History:  Diagnosis Date   Adopted    Cataract    resolved with surgery   Diabetes mellitus    Dyslipidemia    Erectile dysfunction    Herpes labialis    Upper Lip   Hyperlipidemia    Male hypogonadism    Prostate cancer (HCC)    Renal stones    Thyroid  nodule    Right    Surgical History: Past Surgical History:  Procedure Laterality Date   CATARACT EXTRACTION, BILATERAL  2017   Dr. Octavia   COLONOSCOPY WITH PROPOFOL  N/A 03/09/2018   Procedure: COLONOSCOPY WITH PROPOFOL ;  Surgeon: Unk Corinn Skiff, MD;  Location: [facility];  Service: Gastroenterology;  Laterality: N/A;   COLONOSCOPY WITH PROPOFOL  N/A 03/12/2021   Procedure: COLONOSCOPY WITH PROPOFOL ;  Surgeon: Unk Corinn Skiff, MD;  Location: [facility];  Service: Endoscopy;  Laterality: N/A;  priority 4   CYSTOSCOPY W/ URETERAL STENT PLACEMENT  08/09/2017   Procedure: CYSTOSCOPY WITH RETROGRADE PYELOGRAM/URETERAL STENT PLACEMENT;  Surgeon: Kassie Ozell SAUNDERS, MD;  Location: [facility];  Service:  Urology;;   DIALYSIS/PERMA CATHETER INSERTION N/A 06/25/2022   Procedure: DIALYSIS/PERMA CATHETER INSERTION;  Surgeon: Marea Selinda RAMAN, MD;  Location: [facility];  Service: Cardiovascular;  Laterality: N/A;   ESOPHAGOGASTRODUODENOSCOPY (EGD) WITH PROPOFOL  N/A 06/18/2022   Procedure: ESOPHAGOGASTRODUODENOSCOPY (EGD) WITH PROPOFOL ;  Surgeon: Therisa Bi, MD;  Location: [facility];  Service: Gastroenterology;  Laterality: N/A;   EXTRACORPOREAL SHOCK WAVE LITHOTRIPSY Left 09/15/2017   Procedure: EXTRACORPOREAL SHOCK WAVE LITHOTRIPSY (ESWL);  Surgeon: Kassie Ozell SAUNDERS, MD;  Location: [facility];  Service: Urology;  Laterality: Left;   EYE SURGERY Bilateral 2016   cataract extractions   GREEN LIGHT LASER TURP (TRANSURETHRAL RESECTION OF PROSTATE N/A 04/20/2016   Procedure: GREEN LIGHT LASER TURP (TRANSURETHRAL RESECTION OF PROSTATE;  Surgeon: Ozell SAUNDERS Kassie, MD;  Location: [facility];  Service: Urology;  Laterality: N/A;   INGUINAL HERNIA REPAIR  01/11/2012   Procedure: HERNIA REPAIR INGUINAL ADULT;  Surgeon: Donnice KATHEE Lunger, MD;  Location:  SURGERY CENTER;  Service: General;  Laterality: Right;  Open right inguinal hernia repair with mesh   POLYPECTOMY  03/12/2021   Procedure: POLYPECTOMY;  Surgeon: Unk Corinn Skiff, MD;  Location: [facility];  Service: Endoscopy;;   RADIOACTIVE SEED IMPLANT     PROSTATE   URETEROSCOPY WITH HOLMIUM LASER LITHOTRIPSY Right 08/09/2017   Procedure: URETEROSCOPY WITH HOLMIUM LASER LITHOTRIPSY;  Surgeon: Kassie Ozell SAUNDERS,  MD;  Location: [facility];  Service: Urology;  Laterality: Right;    Home Medications:  Allergies as of 08/21/2024   No Known Allergies      Medication List        Accurate as of August 20, 2024  5:04 PM. If you have any questions, ask your nurse or doctor.          acyclovir  800 MG tablet Commonly known as: ZOVIRAX  TAKE 1 TABLET DAILY   Creon  24000-76000 units Cpep Generic drug: Pancrelipase   (Lip-Prot-Amyl) Take 1 tab with all meals and nutren.  6 per day.   fluticasone  50 MCG/ACT nasal spray Commonly known as: FLONASE  Place 2 sprays into both nostrils daily as needed for allergies or rhinitis. What changed: when to take this   Melatonin 10 MG Caps Take 10 mg by mouth at bedtime as needed.   multivitamin capsule Take 1 capsule by mouth daily.   rosuvastatin  20 MG tablet Commonly known as: CRESTOR  Take 1 tablet (20 mg total) by mouth every other day.   tadalafil  10 MG tablet Commonly known as: CIALIS  Take 1-2 tablets (10-20 mg total) by mouth daily as needed (take 1 hour prior to sexual activity).   Vitamin D3 25 MCG (1000 UT) Caps Take 1 capsule (1,000 Units total) by mouth daily.        Allergies: No Known Allergies  Family History: Family History  Adopted: Yes    Social History: See HPI for pertinent social history  ROS: Pertinent ROS in HPI  Physical Exam: There were no vitals taken for this visit.  Constitutional:  Well nourished. Alert and oriented, No acute distress. HEENT: [place] AT, moist mucus membranes.  Trachea midline, no masses. Cardiovascular: No clubbing, cyanosis, or edema. Respiratory: Normal respiratory effort, no increased work of breathing. GI: Abdomen is soft, non tender, non distended, no abdominal masses. Liver and spleen not palpable.  No hernias appreciated.  Stool sample for occult testing is not indicated.   GU: No CVA tenderness.  No bladder fullness or masses.  Patient with circumcised/uncircumcised phallus. ***Foreskin easily retracted***  Urethral meatus is patent.  No penile discharge. No penile lesions or rashes. Scrotum without lesions, cysts, rashes and/or edema.  Testicles are located scrotally bilaterally. No masses are appreciated in the testicles. Left and right epididymis are normal. Rectal: Patient with  normal sphincter tone. Anus and perineum without scarring or rashes. No rectal masses are appreciated. Prostate is  approximately *** grams, *** nodules are appreciated. Seminal vesicles are normal. Skin: No rashes, bruises or suspicious lesions. Lymph: No cervical or inguinal adenopathy. Neurologic: Grossly intact, no focal deficits, moving all 4 extremities. Psychiatric: Normal mood and affect.  Laboratory Data: See EPIC and HPI  I have reviewed the labs.   Pertinent Imaging: N/A  Assessment & Plan:  ***  1. Hypogonadism  - explained that the diagnosis of testosterone  deficiency/hypogonadism requires two morning testosterones at least two days apart below 300 to meet criteria** - explained some insurances also require free and total testosterone , SHBG, FH/LH and prolactin levels as well  - explained some insurances will even require weight loss, managing diabetes, high blood pressure, sleep apnea and liver disease prior to covering testosterone  therapy - explained that TRT is not a treatment for ED, he may see some improvement in his erections, but his ED will likely persist even with therapeutic levels of testosterone  - discussed potential side effects of testosterone  replacement  including stimulation of erythrocytosis; edema; gynecomastia; worsening sleep apnea; venous  thromboembolism; testicular atrophy and infertility.   The theoretical risk of growth stimulation of an undetected prostate cancer was also discussed.  He was informed that current evidence does not provide any definitive answers regarding the risks of testosterone  therapy on prostate cancer and cardiovascular disease. The need for periodic monitoring of his testosterone  level, PSA, hematocrit and DRE was discussed.  This monitoring will be conducted every three months during the first year of TRT and then every 6 months if blood work remains stable, if there is an abnormality found in follow up blood work, it will result in the monitoring of blood work more frequently  - advised that any missed or delayed appointments will also result in  delays in the refilling of the TRT as it is a controlled substance - advised that the office requires one week to refill TRT - Near castrate testosterone  levels*** - Significant symptoms***feeling depressed or tired, having little to no interest in sex, low energy and weak muscles or bones - Recommend starting TRT*** - We discussed the most common forms of replacement including intramuscular injection and gels and he desires to start injections*** - Rx testosterone  cypionate-200 mg every 2 weeks to start*** - Appointment will be made for injection training*** - Follow-up 5 weeks after starting TRT for testosterone  level and symptom check*** - discussed that we follow guidelines for age appropriate testosterone  levels to decide on dosing regimens for TRT and this is based on current agreements in the medical community and we will not deviate from this unless there is good scientific data to do so [employer] may not recognize the parameters we use to determine that the patient has low testosterone  and therefore, if they want to pursue TRT, it will have be out-of-pocket    20-39 years ([phone]) 40-49 ([phone]) (5.3-26.3) 50-59 (215-878) (4.2-22.2) 60-69 (196-859) (3.7-18.9) 70-     ([phone]) (2.2-14.7)  2. Prostate cancer - low risk prostate cancer - PSA's have remained low, demonstrating effective biochemical control - Explained that we will need to monitor his PSA more closely while he is on TRT  3. Nocturia - Explained that this may worsen on TRT, especially if he has any undiagnosed sleep apnea  4. ED - Explained that even with TRT therapy he still may experience issues with erections - Continue tadalafil  20 mg, on-demand dosing    No follow-ups on file.  These notes generated with voice recognition software. I apologize for typographical errors.  CLOTILDA HELON RIGGERS  [facility] [address]  [address] [place], [place] [address] [phone]

## 2024-08-21 ENCOUNTER — Ambulatory Visit (INDEPENDENT_AMBULATORY_CARE_PROVIDER_SITE_OTHER): Admitting: Urology

## 2024-08-21 ENCOUNTER — Encounter: Payer: Self-pay | Admitting: Urology

## 2024-08-21 VITALS — BP 126/62 | HR 78 | Ht 73.0 in | Wt 217.0 lb

## 2024-08-21 DIAGNOSIS — R351 Nocturia: Secondary | ICD-10-CM | POA: Diagnosis not present

## 2024-08-21 DIAGNOSIS — C61 Malignant neoplasm of prostate: Secondary | ICD-10-CM | POA: Diagnosis not present

## 2024-08-21 DIAGNOSIS — E291 Testicular hypofunction: Secondary | ICD-10-CM | POA: Diagnosis not present

## 2024-08-21 DIAGNOSIS — N529 Male erectile dysfunction, unspecified: Secondary | ICD-10-CM

## 2024-08-29 ENCOUNTER — Telehealth: Payer: Self-pay

## 2024-08-29 NOTE — Telephone Encounter (Signed)
 RAGE BEEVER [id number] 09/30/1946   Provider- [facility]   Procedure Code: 88019 Drug Code:S0189  Below to be completed by staff member contacting insurance.   Testopel  verification completed- Yes  Pa needed- No  Insurance contacted - Pa initiated/ complete  [employer]         Auth number: [id number] Approval dates : 08/28/2024

## 2024-08-29 NOTE — Telephone Encounter (Signed)
 No PA required for testopel . Okay to schedule?

## 2024-08-30 NOTE — Telephone Encounter (Signed)
 Called patient and patient didn't answer left voicemail

## 2024-09-27 ENCOUNTER — Ambulatory Visit: Admitting: Urology

## 2024-10-01 ENCOUNTER — Other Ambulatory Visit: Payer: Self-pay | Admitting: Family Medicine

## 2024-10-01 DIAGNOSIS — E7800 Pure hypercholesterolemia, unspecified: Secondary | ICD-10-CM

## 2024-10-01 DIAGNOSIS — R739 Hyperglycemia, unspecified: Secondary | ICD-10-CM

## 2024-10-01 DIAGNOSIS — Z125 Encounter for screening for malignant neoplasm of prostate: Secondary | ICD-10-CM

## 2024-10-01 DIAGNOSIS — D649 Anemia, unspecified: Secondary | ICD-10-CM

## 2024-10-01 NOTE — Progress Notes (Deleted)
   He presents today for Testopel insertion.  Identified upper outer quadrant of right hip for insertion; prepped area with Betadine and injected 10 cc's of Lidocaine 1%  to anesthetize superficially and distally along trocar tract.  Made 3 mm incision using 11 blade of scalpel; trocar with sharp ended stylet was inserted into subcutaneous tissue in line with femur. Sharp stylet was withdrawn and 6 pellets were placed into trocar well. Testopel pellets advanced into tissue using blunt ended stylet. Trocar removed and incision closed using 6 Steri-Strips. Cleansed area to remove Betadine and covered Steri-Strips with outer Band-Aid.  Careful inspection of insertion is done and patient informed of post procedure instructions.  Advised patient to apply ice to the site for 20-30 minutes every hour if needed.  Avoid hot tubes, swimming or full water immersion of the insertion site for 72 hours.  Bandage may be removed after one week.    Patient is advised to contact the office if experiencing drainage of the insertion site, excessive redness or swelling of the site, chills and/or fevers > 101.5, nausea or vomiting, dizziness or lightheadedness and excessive tenderness.  Avoid strenuous activity and heavy lifting for 72 hours.     He will return in one month for serum testosterone, hemoglobin and hematocrit.

## 2024-10-02 ENCOUNTER — Telehealth: Payer: Self-pay | Admitting: Urology

## 2024-10-02 NOTE — Telephone Encounter (Signed)
 PT called today to r/s his testopel  appt to the end of the month.  He wants to know the cost of testopel  and if his insurance pays anything.

## 2024-10-03 ENCOUNTER — Ambulatory Visit: Admitting: Urology

## 2024-10-03 DIAGNOSIS — E291 Testicular hypofunction: Secondary | ICD-10-CM

## 2024-10-05 ENCOUNTER — Telehealth: Payer: Self-pay

## 2024-10-05 NOTE — Telephone Encounter (Signed)
 Attempted to return phone call from wife, no answer.  If she calls back please relay.    Last COVID: 10/09/21 Last Pneumonia: 09/26/13 Last Shingles: 10/05/11  Please schedule pt for a nurse visit appointment if he would like flu shot given in clinic.  All other vaccines would need to be given in pharmacy.  He does not need a prescription for COVID Vaccine, encourage him to go to one of the [facility] to have these vaccines.

## 2024-10-08 ENCOUNTER — Other Ambulatory Visit (INDEPENDENT_AMBULATORY_CARE_PROVIDER_SITE_OTHER)

## 2024-10-08 DIAGNOSIS — E7800 Pure hypercholesterolemia, unspecified: Secondary | ICD-10-CM

## 2024-10-08 DIAGNOSIS — D649 Anemia, unspecified: Secondary | ICD-10-CM

## 2024-10-08 DIAGNOSIS — Z125 Encounter for screening for malignant neoplasm of prostate: Secondary | ICD-10-CM

## 2024-10-08 DIAGNOSIS — R739 Hyperglycemia, unspecified: Secondary | ICD-10-CM

## 2024-10-08 LAB — CBC WITH DIFFERENTIAL/PLATELET
Basophils Absolute: 0 K/uL (ref 0.0–0.1)
Basophils Relative: 0.7 % (ref 0.0–3.0)
Eosinophils Absolute: 0.1 K/uL (ref 0.0–0.7)
Eosinophils Relative: 1.9 % (ref 0.0–5.0)
HCT: 43 % (ref 39.0–52.0)
Hemoglobin: 13.7 g/dL (ref 13.0–17.0)
Lymphocytes Relative: 33.1 % (ref 12.0–46.0)
Lymphs Abs: 1.9 K/uL (ref 0.7–4.0)
MCHC: 31.9 g/dL (ref 30.0–36.0)
MCV: 84.5 fl (ref 78.0–100.0)
Monocytes Absolute: 0.6 K/uL (ref 0.1–1.0)
Monocytes Relative: 10 % (ref 3.0–12.0)
Neutro Abs: 3.1 K/uL (ref 1.4–7.7)
Neutrophils Relative %: 54.3 % (ref 43.0–77.0)
Platelets: 213 K/uL (ref 150.0–400.0)
RBC: 5.09 Mil/uL (ref 4.22–5.81)
RDW: 16.4 % — ABNORMAL HIGH (ref 11.5–15.5)
WBC: 5.6 K/uL (ref 4.0–10.5)

## 2024-10-08 LAB — COMPREHENSIVE METABOLIC PANEL WITH GFR
ALT: 19 U/L (ref 0–53)
AST: 22 U/L (ref 0–37)
Albumin: 4.2 g/dL (ref 3.5–5.2)
Alkaline Phosphatase: 45 U/L (ref 39–117)
BUN: 21 mg/dL (ref 6–23)
CO2: 30 meq/L (ref 19–32)
Calcium: 9.8 mg/dL (ref 8.4–10.5)
Chloride: 103 meq/L (ref 96–112)
Creatinine, Ser: 1.34 mg/dL (ref 0.40–1.50)
GFR: 50.38 mL/min — ABNORMAL LOW (ref >60.00)
Glucose, Bld: 116 mg/dL — ABNORMAL HIGH (ref 70–99)
Potassium: 4.8 meq/L (ref 3.5–5.1)
Sodium: 140 meq/L (ref 135–145)
Total Bilirubin: 0.6 mg/dL (ref 0.2–1.2)
Total Protein: 6.8 g/dL (ref 6.0–8.3)

## 2024-10-08 LAB — LIPID PANEL
Cholesterol: 188 mg/dL (ref 0–200)
HDL: 39.9 mg/dL (ref >39.00)
LDL Cholesterol: 79 mg/dL (ref 0–99)
NonHDL: 147.65
Total CHOL/HDL Ratio: 5
Triglycerides: 343 mg/dL — ABNORMAL HIGH (ref 0.0–149.0)
VLDL: 68.6 mg/dL — ABNORMAL HIGH (ref 0.0–40.0)

## 2024-10-08 LAB — MICROALBUMIN / CREATININE URINE RATIO
Creatinine,U: 132.6 mg/dL
Microalb Creat Ratio: 31.5 mg/g — ABNORMAL HIGH (ref 0.0–30.0)
Microalb, Ur: 4.2 mg/dL — ABNORMAL HIGH (ref 0.0–1.9)

## 2024-10-08 LAB — VITAMIN B12: Vitamin B-12: 501 pg/mL (ref 211–911)

## 2024-10-08 LAB — TSH: TSH: 1.34 u[IU]/mL (ref 0.35–5.50)

## 2024-10-08 LAB — FERRITIN: Ferritin: 394.4 ng/mL — ABNORMAL HIGH (ref 22.0–322.0)

## 2024-10-08 LAB — PSA, MEDICARE: PSA: 0.04 ng/mL — ABNORMAL LOW (ref 0.10–4.00)

## 2024-10-08 LAB — HEMOGLOBIN A1C: Hgb A1c MFr Bld: 6.8 % — ABNORMAL HIGH (ref 4.6–6.5)

## 2024-10-08 NOTE — Telephone Encounter (Signed)
 Spoke with patients wife, states she was able to get her vaccines at the pharmacy, has requested the records be sent to us . Nothing further needed at this time.

## 2024-10-09 ENCOUNTER — Ambulatory Visit: Payer: Self-pay | Admitting: Family Medicine

## 2024-10-14 ENCOUNTER — Other Ambulatory Visit: Payer: Self-pay | Admitting: Family Medicine

## 2024-10-15 ENCOUNTER — Ambulatory Visit: Admitting: Family Medicine

## 2024-10-15 ENCOUNTER — Encounter: Payer: Self-pay | Admitting: Family Medicine

## 2024-10-15 VITALS — BP 130/78 | HR 67 | Temp 98.5°F | Ht 71.5 in | Wt 215.1 lb

## 2024-10-15 DIAGNOSIS — K8591 Acute pancreatitis with uninfected necrosis, unspecified: Secondary | ICD-10-CM

## 2024-10-15 DIAGNOSIS — E7800 Pure hypercholesterolemia, unspecified: Secondary | ICD-10-CM

## 2024-10-15 DIAGNOSIS — E1129 Type 2 diabetes mellitus with other diabetic kidney complication: Secondary | ICD-10-CM | POA: Diagnosis not present

## 2024-10-15 DIAGNOSIS — R809 Proteinuria, unspecified: Secondary | ICD-10-CM

## 2024-10-15 DIAGNOSIS — E119 Type 2 diabetes mellitus without complications: Secondary | ICD-10-CM

## 2024-10-15 DIAGNOSIS — Z7189 Other specified counseling: Secondary | ICD-10-CM

## 2024-10-15 DIAGNOSIS — Z0000 Encounter for general adult medical examination without abnormal findings: Secondary | ICD-10-CM

## 2024-10-15 MED ORDER — ROSUVASTATIN CALCIUM 20 MG PO TABS: 20.0000 mg | ORAL_TABLET | ORAL | 3 refills | Status: AC

## 2024-10-15 NOTE — Patient Instructions (Addendum)
 Ask the pharmacy about RSV vaccine.  Recheck labs in about 3 months.  Nonfasting lab visit.  Drink water  prior to the lab.   Office visit about 1 week after labs.

## 2024-10-15 NOTE — Progress Notes (Unsigned)
 Tetanus d/w pt.  RSV vaccine d/w pt.  Shingles d/w pt- done at [facility] 2025. Flu 2025 PNA up to date.  covid vaccine 2024 PSA 0.04 in 2024.  Wife designated if patient were incapacitated.  He would have [employer] designated next if his wife were incapacitated.   Colonoscopy 2022  Diabetes:  No meds.  Hypoglycemic episodes: no sx Hyperglycemic episodes: no sx Feet problems: no Blood Sugars averaging: not checked.   eye exam within last year: about 1 month ago, [facility] [address] in [place].  Requesting report.  Elevated Cholesterol: Using medications without problems: yes Muscle aches: no Diet compliance: yes Exercise: yes Labs d/w pt.   Pancreatic insufficiency.  Still on creon  at baseline.  Able to take meals w/o pain.   Ferritin is trending down.  Labs discussed.  D/w pt about taking creon  prior to meals, snacks.    PMH and SH reviewed  Meds, vitals, and allergies reviewed.   ROS: Per HPI unless specifically indicated in ROS section   GEN: nad, alert and oriented HEENT: ncat NECK: supple w/o LA CV: rrr. PULM: ctab, no inc wob ABD: soft, +bs EXT: no edema SKIN: no acute rash  32 minutes were devoted to patient care in this encounter (this includes time spent reviewing the patient's file/history, interviewing and examining the patient, counseling/reviewing plan with patient).

## 2024-10-17 NOTE — Assessment & Plan Note (Signed)
 Tetanus d/w pt.  RSV vaccine d/w pt.  Shingles d/w pt- done at [facility] 2025. Flu 2025 PNA up to date.  covid vaccine 2024 PSA 0.04 in 2024.  Wife designated if patient were incapacitated.  He would have [employer] designated next if his wife were incapacitated.   Colonoscopy 2022

## 2024-10-17 NOTE — Assessment & Plan Note (Addendum)
 History of, with history of pancreatic insufficiency.  Still on creon  at baseline.  Able to take meals w/o pain.  Benign abdominal exam.  Would continue Creon .  Discussed timing of Creon  use, with meals/snacks.

## 2024-10-17 NOTE — Assessment & Plan Note (Addendum)
 Discussed options.  No medications currently.  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  Discussed with patient.  Microalbuminuria discussed with patient, mild.  Discussed options.  I do not think it makes sense to change his medications yet.  I would like to recheck labs in about 3 months.  Nonfasting lab visit.  Discussed making sure he is well-hydrated prior to lab collection.  We can go from there.  Continue work on diet and exercise in the meantime, as he has already been doing.  Defer Jardiance start at this point.

## 2024-10-17 NOTE — Assessment & Plan Note (Signed)
Wife designated if patient were incapacitated.  He would have [place] designated next if his wife were incapacitated.

## 2024-10-17 NOTE — Assessment & Plan Note (Signed)
 Continue his current work on diet and exercise.  Continue Crestor  every other day.

## 2024-10-26 ENCOUNTER — Ambulatory Visit: Admitting: Urology

## 2024-10-30 NOTE — Progress Notes (Deleted)
   He presents today for Testopel insertion.  Identified upper outer quadrant of right hip for insertion; prepped area with Betadine and injected 10 cc's of Lidocaine 1%  to anesthetize superficially and distally along trocar tract.  Made 3 mm incision using 11 blade of scalpel; trocar with sharp ended stylet was inserted into subcutaneous tissue in line with femur. Sharp stylet was withdrawn and 6 pellets were placed into trocar well. Testopel pellets advanced into tissue using blunt ended stylet. Trocar removed and incision closed using 6 Steri-Strips. Cleansed area to remove Betadine and covered Steri-Strips with outer Band-Aid.  Careful inspection of insertion is done and patient informed of post procedure instructions.  Advised patient to apply ice to the site for 20-30 minutes every hour if needed.  Avoid hot tubes, swimming or full water immersion of the insertion site for 72 hours.  Bandage may be removed after one week.    Patient is advised to contact the office if experiencing drainage of the insertion site, excessive redness or swelling of the site, chills and/or fevers > 101.5, nausea or vomiting, dizziness or lightheadedness and excessive tenderness.  Avoid strenuous activity and heavy lifting for 72 hours.     He will return in one month for serum testosterone, hemoglobin and hematocrit.

## 2024-10-31 ENCOUNTER — Telehealth: Payer: Self-pay | Admitting: Urology

## 2024-10-31 NOTE — Telephone Encounter (Signed)
 Patient requesting call from office to get testopel  information for his insurance company. He cancelled his appt for 11/01/24, and will reschedule after he speaks with his insurance.

## 2024-11-01 ENCOUNTER — Ambulatory Visit: Admitting: Urology

## 2024-11-01 DIAGNOSIS — E291 Testicular hypofunction: Secondary | ICD-10-CM

## 2024-11-01 NOTE — Telephone Encounter (Signed)
 Spoke with patient, provided procedure and medication code. Pt states he will give us  a call when he gets more information from his insurance.

## 2025-01-04 ENCOUNTER — Other Ambulatory Visit: Payer: Self-pay | Admitting: *Deleted

## 2025-01-04 ENCOUNTER — Ambulatory Visit (INDEPENDENT_AMBULATORY_CARE_PROVIDER_SITE_OTHER): Admitting: Family Medicine

## 2025-01-04 ENCOUNTER — Encounter: Payer: Self-pay | Admitting: Family Medicine

## 2025-01-04 ENCOUNTER — Telehealth: Payer: Self-pay

## 2025-01-04 VITALS — BP 118/72 | HR 52 | Temp 98.0°F | Ht 71.5 in | Wt 218.1 lb

## 2025-01-04 DIAGNOSIS — R053 Chronic cough: Secondary | ICD-10-CM

## 2025-01-04 MED ORDER — GUAIFENESIN-CODEINE 100-10 MG/5ML PO SOLN: 5.0000 mL | Freq: Four times a day (QID) | ORAL | 0 refills | Status: AC | PRN

## 2025-01-04 MED ORDER — ACYCLOVIR 800 MG PO TABS: 800.0000 mg | ORAL_TABLET | Freq: Every day | ORAL | 1 refills | Status: AC

## 2025-01-04 NOTE — Progress Notes (Signed)
 "   Patient ID: Xavier Esparza, male    DOB: December 30, 1945, 79 y.o.   MRN: [id number]  This visit was conducted in person.  BP 118/72   Pulse (!) 52   Temp 98 F (36.7 C) (Temporal)   Ht 5' 11.5 (1.816 m)   Wt 218 lb 2 oz (98.9 kg)   SpO2 99%   BMI 30.00 kg/m    CC:  Chief Complaint  Patient presents with   Cough    Since before Christmas    Subjective:   HPI: Xavier Esparza is a 79 y.o. male presenting on 01/04/2025 for Cough (Since before Christmas)   Date of onset:  2-3 weeks Initial symptoms included  nasal congestion,  Symptoms progressed to cough, dry  Wakes coughing fits in middle of night  No  ear pain, no face pain  No fever.  No SOB, no wheeze.  Feels well overall just annoying cough.    Sick contacts:  none COVID testing:   negative early on     He has tried to treat with  Dayquil and Nyquil, cough drop.     No history of chronic lung disease such as asthma or COPD.  Former remote smoker.   Hx of CVA, DM Lab Results  Component Value Date   HGBA1C 6.8 (H) 10/08/2024        Relevant past medical, surgical, family and social history reviewed and updated as indicated. Interim medical history since our last visit reviewed. Allergies and medications reviewed and updated. Outpatient Medications Prior to Visit  Medication Sig Dispense Refill   acyclovir  (ZOVIRAX ) 800 MG tablet TAKE 1 TABLET DAILY 90 tablet 1   Cholecalciferol (VITAMIN D3) 25 MCG (1000 UT) CAPS Take 1 capsule (1,000 Units total) by mouth daily. 100 capsule 3   fluticasone  (FLONASE ) 50 MCG/ACT nasal spray Place 2 sprays into both nostrils daily as needed for allergies or rhinitis.     Melatonin 10 MG CAPS Take 10 mg by mouth at bedtime as needed.     Multiple Vitamin (MULTIVITAMIN) capsule Take 1 capsule by mouth daily.     Pancrelipase , Lip-Prot-Amyl, (CREON ) 24000-76000 units CPEP Take 1 tab with all meals and nutren.  6 per day. 540 capsule 1   rosuvastatin  (CRESTOR ) 20 MG tablet Take 1  tablet (20 mg total) by mouth every other day. 45 tablet 3   tadalafil  (CIALIS ) 10 MG tablet Take 1-2 tablets (10-20 mg total) by mouth daily as needed (take 1 hour prior to sexual activity). 30 tablet 11   No facility-administered medications prior to visit.     Per HPI unless specifically indicated in ROS section below Review of Systems  Constitutional:  Negative for fatigue and fever.  HENT:  Negative for ear pain, sinus pressure, sinus pain and sore throat.   Eyes:  Negative for pain.  Respiratory:  Positive for cough. Negative for shortness of breath.   Cardiovascular:  Negative for chest pain, palpitations and leg swelling.  Gastrointestinal:  Negative for abdominal pain.  Genitourinary:  Negative for dysuria.  Musculoskeletal:  Negative for arthralgias.  Neurological:  Negative for syncope, light-headedness and headaches.  Psychiatric/Behavioral:  Negative for dysphoric mood.    Objective:  BP 118/72   Pulse (!) 52   Temp 98 F (36.7 C) (Temporal)   Ht 5' 11.5 (1.816 m)   Wt 218 lb 2 oz (98.9 kg)   SpO2 99%   BMI 30.00 kg/m   Wt Readings from Last 3 Encounters:  01/04/25 218 lb 2 oz (98.9 kg)  10/15/24 215 lb 2 oz (97.6 kg)  08/21/24 217 lb (98.4 kg)      Physical Exam Vitals reviewed.  Constitutional:      Appearance: He is well-developed.  HENT:     Head: Normocephalic.     Right Ear: Hearing normal.     Left Ear: Hearing normal.     Nose: Nose normal.  Neck:     Thyroid : No thyroid  mass or thyromegaly.     Vascular: No carotid bruit.     Trachea: Trachea normal.  Cardiovascular:     Rate and Rhythm: Normal rate and regular rhythm.     Pulses: Normal pulses.     Heart sounds: Heart sounds not distant. No murmur heard.    No friction rub. No gallop.     Comments: No peripheral edema Pulmonary:     Effort: Pulmonary effort is normal. No respiratory distress.     Breath sounds: Normal breath sounds.  Skin:    General: Skin is warm and dry.      Findings: No rash.  Psychiatric:        Speech: Speech normal.        Behavior: Behavior normal.        Thought Content: Thought content normal.       Results for orders placed or performed in visit on 10/08/24  Ferritin   Collection Time: 10/08/24  9:06 AM  Result Value Ref Range   Ferritin 394.4 (H) 22.0 - 322.0 ng/mL  Microalbumin / creatinine urine ratio   Collection Time: 10/08/24  9:06 AM  Result Value Ref Range   Microalb, Ur 4.2 (H) 0.0 - 1.9 mg/dL   Creatinine,U 867.3 mg/dL   Microalb Creat Ratio 31.5 (H) 0.0 - 30.0 mg/g  Vitamin B12   Collection Time: 10/08/24  9:06 AM  Result Value Ref Range   Vitamin B-12 501 211 - 911 pg/mL  TSH   Collection Time: 10/08/24  9:06 AM  Result Value Ref Range   TSH 1.34 0.35 - 5.50 uIU/mL  Hemoglobin A1c   Collection Time: 10/08/24  9:06 AM  Result Value Ref Range   Hgb A1c MFr Bld 6.8 (H) 4.6 - 6.5 %  PSA, Medicare   Collection Time: 10/08/24  9:06 AM  Result Value Ref Range   PSA 0.04 (L) 0.10 - 4.00 ng/ml  Lipid panel   Collection Time: 10/08/24  9:06 AM  Result Value Ref Range   Cholesterol 188 0 - 200 mg/dL   Triglycerides 656.9 (H) 0.0 - 149.0 mg/dL   HDL 60.09 >60.99 mg/dL   VLDL 31.3 (H) 0.0 - 59.9 mg/dL   LDL Cholesterol 79 0 - 99 mg/dL   Total CHOL/HDL Ratio 5    NonHDL 147.65   Comprehensive metabolic panel with GFR   Collection Time: 10/08/24  9:06 AM  Result Value Ref Range   Sodium 140 135 - 145 mEq/L   Potassium 4.8 3.5 - 5.1 mEq/L   Chloride 103 96 - 112 mEq/L   CO2 30 19 - 32 mEq/L   Glucose, Bld 116 (H) 70 - 99 mg/dL   BUN 21 6 - 23 mg/dL   Creatinine, Ser 8.65 0.40 - 1.50 mg/dL   Total Bilirubin 0.6 0.2 - 1.2 mg/dL   Alkaline Phosphatase 45 39 - 117 U/L   AST 22 0 - 37 U/L   ALT 19 0 - 53 U/L   Total Protein 6.8 6.0 - 8.3 g/dL  Albumin  4.2 3.5 - 5.2 g/dL   GFR 49.61 (L) >39.99 mL/min   Calcium  9.8 8.4 - 10.5 mg/dL  CBC with Differential/Platelet   Collection Time: 10/08/24  9:06 AM  Result  Value Ref Range   WBC 5.6 4.0 - 10.5 K/uL   RBC 5.09 4.22 - 5.81 Mil/uL   Hemoglobin 13.7 13.0 - 17.0 g/dL   HCT 56.9 60.9 - 47.9 %   MCV 84.5 78.0 - 100.0 fl   MCHC 31.9 30.0 - 36.0 g/dL   RDW 83.5 (H) 88.4 - 84.4 %   Platelets 213.0 150.0 - 400.0 K/uL   Neutrophils Relative % 54.3 43.0 - 77.0 %   Lymphocytes Relative 33.1 12.0 - 46.0 %   Monocytes Relative 10.0 3.0 - 12.0 %   Eosinophils Relative 1.9 0.0 - 5.0 %   Basophils Relative 0.7 0.0 - 3.0 %   Neutro Abs 3.1 1.4 - 7.7 K/uL   Lymphs Abs 1.9 0.7 - 4.0 K/uL   Monocytes Absolute 0.6 0.1 - 1.0 K/uL   Eosinophils Absolute 0.1 0.0 - 0.7 K/uL   Basophils Absolute 0.0 0.0 - 0.1 K/uL    Assessment and Plan  Persistent cough Assessment & Plan: Acute, postinfectious cough Patient feeling significantly better and feels well, no fever or shortness of breath. Continued irritating cough occasionally waking at night Clear lung exam Patient is a remote smoker so we did discuss consideration of chest x-ray but given clear lung exam  and extreme remoteness of smoking we opted against this at this time.    Will provide bedtime cough suppressant.  Patient can use Mucinex  DM during the day.  Return and ER precautions provided.   Other orders -     guaiFENesin -Codeine ; Take 5 mLs by mouth every 6 (six) hours as needed for cough.  Dispense: 118 mL; Refill: 0    No follow-ups on file.   Greig Ring, MD  "

## 2025-01-04 NOTE — Assessment & Plan Note (Signed)
 Acute, postinfectious cough Patient feeling significantly better and feels well, no fever or shortness of breath. Continued irritating cough occasionally waking at night Clear lung exam Patient is a remote smoker so we did discuss consideration of chest x-ray but given clear lung exam  and extreme remoteness of smoking we opted against this at this time.    Will provide bedtime cough suppressant.  Patient can use Mucinex  DM during the day.  Return and ER precautions provided.

## 2025-01-04 NOTE — Telephone Encounter (Signed)
 Called patient let him know that I would be sending this message to his pcp. Patient aware that he is out of the office and will address once he is back.

## 2025-01-04 NOTE — Telephone Encounter (Signed)
 Copied from CRM [id number]. Topic: Clinical - Medical Advice >> Jan 04, 2025 10:21 AM Ahlexyia S wrote: Reason for CRM: Pt called in stating that he had a appointment this morning with Dr. Avelina and was told there was an increase in his A1C. Pt stated he is concerned about this and is wanting to know if he should get back on metformin  to get this back in control. Pt was previously taking this medication but was advised to stop due to his A1C going down. Pt is requesting a callback.

## 2025-01-06 NOTE — Telephone Encounter (Signed)
 I would prefer to recheck his labs prior to making a decision here.   He had his A1c last checked on 10/08/2024.  We could recheck it anytime after January 13th, since that would be 3 months out.  Would be reasonable to have office visit about 1 week after labs.    Please see about setting that up.  Thanks.

## 2025-01-07 NOTE — Telephone Encounter (Signed)
 Attempted to call pt, no answer was not able to leave message.    Pt has an appointment with Dr. Cleatus 01/22/25.  Need to let pt know that A1C follow up will be completed at that time.

## 2025-01-11 ENCOUNTER — Ambulatory Visit: Payer: Medicare Other

## 2025-01-11 ENCOUNTER — Telehealth: Payer: Self-pay

## 2025-01-11 VITALS — Ht 70.0 in | Wt 218.0 lb

## 2025-01-11 DIAGNOSIS — Z0000 Encounter for general adult medical examination without abnormal findings: Secondary | ICD-10-CM | POA: Diagnosis not present

## 2025-01-11 NOTE — Patient Instructions (Addendum)
 Mr. Xavier Esparza,  Thank you for taking the time for your Medicare Wellness Visit. I appreciate your continued commitment to your health goals. Please review the care plan we discussed, and feel free to reach out if I can assist you further.  Please note that Annual Wellness Visits do not include a physical exam. Some assessments may be limited, especially if the visit was conducted virtually. If needed, we may recommend an in-person follow-up with your provider.  Ongoing Care Seeing your primary care provider every 3 to 6 months helps us  monitor your health and provide consistent, personalized care.   Referrals If a referral was made during today's visit and you haven't received any updates within two weeks, please contact the referred provider directly to check on the status.  Recommended Screenings:  Health Maintenance  Topic Date Due   Eye exam for diabetics  11/15/2019   Complete foot exam   07/07/2022   Zoster (Shingles) Vaccine (2 of 2) 08/01/2024   COVID-19 Vaccine (8 - Pfizer risk 2025-26 season) 04/04/2025   Kidney health urinalysis for diabetes  04/08/2025   Hemoglobin A1C  04/08/2025   Yearly kidney function blood test for diabetes  10/08/2025   Medicare Annual Wellness Visit  01/11/2026   Colon Cancer Screening  03/12/2026   DTaP/Tdap/Td vaccine (3 - Td or Tdap) 06/06/2034   Pneumococcal Vaccine for age over 17  Completed   Flu Shot  Completed   Hepatitis C Screening  Completed   Meningitis B Vaccine  Aged Out       01/11/2025   11:41 AM  Advanced Directives  Does Patient Have a Medical Advance Directive? Yes  Type of Estate Agent of [place];Living will  Does patient want to make changes to medical advance directive? Yes (Inpatient - patient requests chaplain consult to change a medical advance directive)  Copy of Healthcare Power of Attorney in Chart? No - copy requested    Vision: Annual vision screenings are recommended for early detection of  glaucoma, cataracts, and diabetic retinopathy. These exams can also reveal signs of chronic conditions such as diabetes and high blood pressure.  Dental: Annual dental screenings help detect early signs of oral cancer, gum disease, and other conditions linked to overall health, including heart disease and diabetes.

## 2025-01-11 NOTE — Telephone Encounter (Signed)
 Would like for the nurse to call - is wanting to know should he Restart Metformin  due to changes in his glucose levels.

## 2025-01-11 NOTE — Telephone Encounter (Signed)
 Called patient reviewed all information and repeated back to me. Will call if any questions.  ? ?

## 2025-01-11 NOTE — Progress Notes (Signed)
 "  Chief Complaint  Patient presents with   Medicare Wellness     Subjective:   Xavier Esparza is a 79 y.o. male who presents for a Medicare Annual Wellness Visit.  Visit info / Clinical Intake: Medicare Wellness Visit Type:: Subsequent Annual Wellness Visit Persons participating in visit and providing information:: patient Medicare Wellness Visit Mode:: Video Since this visit was completed virtually, some vitals may be partially provided or unavailable. Missing vitals are due to the limitations of the virtual format.: Documented vitals are patient reported If Telephone or Video please confirm:: I connected with patient using audio/video enable telemedicine. I verified patient identity with two identifiers, discussed telehealth limitations, and patient agreed to proceed. Patient Location:: Home Provider Location:: Office Interpreter Needed?: No Pre-visit prep was completed: yes AWV questionnaire completed by patient prior to visit?: yes Date:: 01/11/25 Living arrangements:: lives with spouse/significant other Patient's Overall Health Status Rating: good Typical amount of pain: none Does pain affect daily life?: no Are you currently prescribed opioids?: no  Dietary Habits and Nutritional Risks How many meals a day?: 3 Eats fruit and vegetables daily?: yes Most meals are obtained by: preparing own meals; eating out In the last 2 weeks, have you had any of the following?: none Diabetic:: (!) yes Any non-healing wounds?: no How often do you check your BS?: 0 Would you like to be referred to a Nutritionist or for Diabetic Management? : no  Functional Status Activities of Daily Living (to include ambulation/medication): Independent Ambulation: Independent with device- listed below Home Assistive Devices/Equipment: Eyeglasses Medication Administration: Independent Home Management (perform basic housework or laundry): Independent Manage your own finances?: yes Primary transportation  is: driving Concerns about vision?: no *vision screening is required for WTM* Concerns about hearing?: no  Fall Screening Falls in the past year?: 0 Number of falls in past year: 0 Was there an injury with Fall?: 0 Fall Risk Category Calculator: 0 Patient Fall Risk Level: Low Fall Risk  Fall Risk Patient at Risk for Falls Due to: No Fall Risks Fall risk Follow up: Falls evaluation completed; Falls prevention discussed  Home and Transportation Safety: All rugs have non-skid backing?: yes All stairs or steps have railings?: yes Grab bars in the bathtub or shower?: yes Have non-skid surface in bathtub or shower?: (!) no Good home lighting?: yes Regular seat belt use?: yes Hospital stays in the last year:: no  Cognitive Assessment Difficulty concentrating, remembering, or making decisions? : no Will 6CIT or Mini Cog be Completed: yes What year is it?: 0 points What month is it?: 0 points Give patient an address phrase to remember (5 components): [address] [place], [place] About what time is it?: 0 points Count backwards from 20 to 1: 0 points Say the months of the year in reverse: 0 points Repeat the address phrase from earlier: 0 points 6 CIT Score: 0 points  Advance Directives (For Healthcare) Does Patient Have a Medical Advance Directive?: Yes Does patient want to make changes to medical advance directive?: Yes (Inpatient - patient requests chaplain consult to change a medical advance directive) Type of Advance Directive: Healthcare Power of [place]; Living will Copy of Healthcare Power of Attorney in Chart?: No - copy requested Copy of Living Will in Chart?: No - copy requested  Reviewed/Updated  Reviewed/Updated: Reviewed All (Medical, Surgical, Family, Medications, Allergies, Care Teams, Patient Goals)    Allergies (verified) Patient has no known allergies.   Current Medications (verified) Outpatient Encounter Medications as of 01/11/2025  Medication  Sig    acyclovir  (ZOVIRAX ) 800 MG tablet Take 1 tablet (800 mg total) by mouth daily.   Cholecalciferol (VITAMIN D3) 25 MCG (1000 UT) CAPS Take 1 capsule (1,000 Units total) by mouth daily.   fluticasone  (FLONASE ) 50 MCG/ACT nasal spray Place 2 sprays into both nostrils daily as needed for allergies or rhinitis.   Melatonin 10 MG CAPS Take 10 mg by mouth at bedtime as needed.   Multiple Vitamin (MULTIVITAMIN) capsule Take 1 capsule by mouth daily.   Pancrelipase , Lip-Prot-Amyl, (CREON ) 24000-76000 units CPEP Take 1 tab with all meals and nutren.  6 per day.   rosuvastatin  (CRESTOR ) 20 MG tablet Take 1 tablet (20 mg total) by mouth every other day.   tadalafil  (CIALIS ) 10 MG tablet Take 1-2 tablets (10-20 mg total) by mouth daily as needed (take 1 hour prior to sexual activity).   guaiFENesin -codeine  100-10 MG/5ML syrup Take 5 mLs by mouth every 6 (six) hours as needed for cough. (Patient not taking: Reported on 01/11/2025)   No facility-administered encounter medications on file as of 01/11/2025.    History: Past Medical History:  Diagnosis Date   Adopted    Cataract    resolved with surgery   Diabetes mellitus    Dyslipidemia    Erectile dysfunction    Herpes labialis    Upper Lip   Hyperlipidemia    Male hypogonadism    Prostate cancer (HCC)    Renal stones    Thyroid  nodule    Right   Past Surgical History:  Procedure Laterality Date   CATARACT EXTRACTION, BILATERAL  2017   Dr. Octavia   COLONOSCOPY WITH PROPOFOL  N/A 03/09/2018   Procedure: COLONOSCOPY WITH PROPOFOL ;  Surgeon: Unk Corinn Skiff, MD;  Location: [facility];  Service: Gastroenterology;  Laterality: N/A;   COLONOSCOPY WITH PROPOFOL  N/A 03/12/2021   Procedure: COLONOSCOPY WITH PROPOFOL ;  Surgeon: Unk Corinn Skiff, MD;  Location: [facility];  Service: Endoscopy;  Laterality: N/A;  priority 4   CYSTOSCOPY W/ URETERAL STENT PLACEMENT  08/09/2017   Procedure: CYSTOSCOPY WITH RETROGRADE PYELOGRAM/URETERAL STENT  PLACEMENT;  Surgeon: Kassie Ozell SAUNDERS, MD;  Location: [facility];  Service: Urology;;   DIALYSIS/PERMA CATHETER INSERTION N/A 06/25/2022   Procedure: DIALYSIS/PERMA CATHETER INSERTION;  Surgeon: Marea Selinda RAMAN, MD;  Location: [facility];  Service: Cardiovascular;  Laterality: N/A;   ESOPHAGOGASTRODUODENOSCOPY (EGD) WITH PROPOFOL  N/A 06/18/2022   Procedure: ESOPHAGOGASTRODUODENOSCOPY (EGD) WITH PROPOFOL ;  Surgeon: Therisa Bi, MD;  Location: [facility];  Service: Gastroenterology;  Laterality: N/A;   EXTRACORPOREAL SHOCK WAVE LITHOTRIPSY Left 09/15/2017   Procedure: EXTRACORPOREAL SHOCK WAVE LITHOTRIPSY (ESWL);  Surgeon: Kassie Ozell SAUNDERS, MD;  Location: [facility];  Service: Urology;  Laterality: Left;   EYE SURGERY Bilateral 2016   cataract extractions   GREEN LIGHT LASER TURP (TRANSURETHRAL RESECTION OF PROSTATE N/A 04/20/2016   Procedure: GREEN LIGHT LASER TURP (TRANSURETHRAL RESECTION OF PROSTATE;  Surgeon: Ozell SAUNDERS Kassie, MD;  Location: [facility];  Service: Urology;  Laterality: N/A;   INGUINAL HERNIA REPAIR  01/11/2012   Procedure: HERNIA REPAIR INGUINAL ADULT;  Surgeon: Donnice KATHEE Lunger, MD;  Location: [facility];  Service: General;  Laterality: Right;  Open right inguinal hernia repair with mesh   POLYPECTOMY  03/12/2021   Procedure: POLYPECTOMY;  Surgeon: Unk Corinn Skiff, MD;  Location: [facility];  Service: Endoscopy;;   RADIOACTIVE SEED IMPLANT     PROSTATE   URETEROSCOPY WITH HOLMIUM LASER LITHOTRIPSY Right 08/09/2017   Procedure: URETEROSCOPY  WITH HOLMIUM LASER LITHOTRIPSY;  Surgeon: Kassie Ozell SAUNDERS, MD;  Location: [facility];  Service: Urology;  Laterality: Right;   Family History  Adopted: Yes   Social History   Occupational History   Not on file  Tobacco Use   Smoking status: Former    Current packs/day: 0.00    Types: Cigarettes    Start date: 12/27/1960    Quit date: 12/27/1990    Years since quitting: 34.0   Smokeless tobacco: Never  Vaping  Use   Vaping status: Never Used  Substance and Sexual Activity   Alcohol use: Not Currently   Drug use: No   Sexual activity: Yes   Tobacco Counseling Counseling given: Yes  SDOH Screenings   Food Insecurity: No Food Insecurity (01/11/2025)  Housing: Low Risk (01/11/2025)  Transportation Needs: No Transportation Needs (01/11/2025)  Utilities: Not At Risk (01/11/2025)  Alcohol Screen: Low Risk (01/11/2024)  Depression (PHQ2-9): Low Risk (01/11/2025)  Financial Resource Strain: Low Risk (01/11/2025)  Physical Activity: Sufficiently Active (01/11/2025)  Social Connections: Socially Integrated (01/11/2025)  Stress: No Stress Concern Present (01/11/2025)  Tobacco Use: Medium Risk (01/11/2025)  Health Literacy: Adequate Health Literacy (01/11/2025)   See flowsheets for full screening details  Depression Screen PHQ 2 & 9 Depression Scale- Over the past 2 weeks, how often have you been bothered by any of the following problems? Little interest or pleasure in doing things: 0 Feeling down, depressed, or hopeless (PHQ Adolescent also includes...irritable): 0 PHQ-2 Total Score: 0 Trouble falling or staying asleep, or sleeping too much: 0 Feeling tired or having little energy: 0 Poor appetite or overeating (PHQ Adolescent also includes...weight loss): 0 Feeling bad about yourself - or that you are a failure or have let yourself or your family down: 0 Trouble concentrating on things, such as reading the newspaper or watching television (PHQ Adolescent also includes...like school work): 0 Moving or speaking so slowly that other people could have noticed. Or the opposite - being so fidgety or restless that you have been moving around a lot more than usual: 0 Thoughts that you would be better off dead, or of hurting yourself in some way: 0 PHQ-9 Total Score: 0 If you checked off any problems, how difficult have these problems made it for you to do your work, take care of things at home, or get along with  other people?: Not difficult at all  Depression Treatment Depression Interventions/Treatment : [id number] Score <4 Follow-up Not Indicated     Goals Addressed               This Visit's Progress     Patient Stated (pt-stated)        Patient stated he plans to continue staying active and watching sugar intake (diet)             Objective:    Today's Vitals   01/11/25 1138  Weight: 218 lb (98.9 kg)  Height: 5' 10 (1.778 m)   Body mass index is 31.28 kg/m.  Hearing/Vision screen Hearing Screening - Comments:: Denies hearing difficulties   Vision Screening - Comments:: Wears rx glasses - up to date with routine eye exams with Elspeth Pinal Immunizations and Health Maintenance Health Maintenance  Topic Date Due   FOOT EXAM  07/07/2022   Zoster Vaccines- Shingrix (2 of 2) 08/01/2024   COVID-19 Vaccine (8 - Pfizer risk 2025-26 season) 04/04/2025   Diabetic kidney evaluation - Urine ACR  04/08/2025   HEMOGLOBIN A1C  04/08/2025   Diabetic  kidney evaluation - eGFR measurement  10/08/2025   OPHTHALMOLOGY EXAM  10/22/2025   Medicare Annual Wellness (AWV)  01/11/2026   Colonoscopy  03/12/2026   DTaP/Tdap/Td (3 - Td or Tdap) 06/06/2034   Pneumococcal Vaccine: 50+ Years  Completed   Influenza Vaccine  Completed   Hepatitis C Screening  Completed   Meningococcal B Vaccine  Aged Out        Assessment/Plan:  This is a routine wellness examination for Zubair.  Patient Care Team: Cleatus Arlyss RAMAN, MD as PCP - General (Family Medicine) Kassie Ozell SAUNDERS, MD as Consulting Physician (Urology) Allison Rocky Caldron, PA-C (Gastroenterology) Cleotilde Elspeth CROME, OD (Optometry)  I have personally reviewed and noted the following in the patients chart:   Medical and social history Use of alcohol, tobacco or illicit drugs  Current medications and supplements including opioid prescriptions. Functional ability and status Nutritional status Physical activity Advanced directives List of  other physicians Hospitalizations, surgeries, and ER visits in previous 12 months Vitals Screenings to include cognitive, depression, and falls Referrals and appointments  No orders of the defined types were placed in this encounter.  In addition, I have reviewed and discussed with patient certain preventive protocols, quality metrics, and best practice recommendations. A written personalized care plan for preventive services as well as general preventive health recommendations were provided to patient.   Verdie CHRISTELLA Saba, CMA   01/11/2025   Return in 1 year (on 01/11/2026).  After Visit Summary: (MyChart) Due to this being a telephonic visit, the after visit summary with patients personalized plan was offered to patient via MyChart   Nurse Notes: Appt w/PCP for Diabetes f/u on 01/22/25; scheduled 2027 AWV. "

## 2025-01-11 NOTE — Telephone Encounter (Signed)
 Called patient see documentation under phone note from 1/9.

## 2025-01-15 ENCOUNTER — Other Ambulatory Visit

## 2025-01-15 DIAGNOSIS — E1129 Type 2 diabetes mellitus with other diabetic kidney complication: Secondary | ICD-10-CM

## 2025-01-15 DIAGNOSIS — R809 Proteinuria, unspecified: Secondary | ICD-10-CM | POA: Diagnosis not present

## 2025-01-15 LAB — BASIC METABOLIC PANEL WITH GFR
BUN: 21 mg/dL (ref 6–23)
CO2: 31 meq/L (ref 19–32)
Calcium: 9.8 mg/dL (ref 8.4–10.5)
Chloride: 103 meq/L (ref 96–112)
Creatinine, Ser: 1.25 mg/dL (ref 0.40–1.50)
GFR: 54.66 mL/min — ABNORMAL LOW
Glucose, Bld: 117 mg/dL — ABNORMAL HIGH (ref 70–99)
Potassium: 4.6 meq/L (ref 3.5–5.1)
Sodium: 140 meq/L (ref 135–145)

## 2025-01-15 LAB — MICROALBUMIN / CREATININE URINE RATIO
Creatinine,U: 162.1 mg/dL
Microalb Creat Ratio: 29.5 mg/g (ref 0.0–30.0)
Microalb, Ur: 4.8 mg/dL — ABNORMAL HIGH (ref 0.7–1.9)

## 2025-01-15 LAB — HEMOGLOBIN A1C: Hgb A1c MFr Bld: 6.9 % — ABNORMAL HIGH (ref 4.6–6.5)

## 2025-01-16 ENCOUNTER — Ambulatory Visit: Payer: Self-pay | Admitting: Family Medicine

## 2025-01-22 ENCOUNTER — Ambulatory Visit: Admitting: Family Medicine

## 2025-01-28 ENCOUNTER — Ambulatory Visit: Admitting: Family Medicine

## 2025-01-31 ENCOUNTER — Ambulatory Visit: Admitting: Family Medicine

## 2025-01-31 ENCOUNTER — Encounter: Payer: Self-pay | Admitting: Family Medicine

## 2025-01-31 MED ORDER — METFORMIN HCL 500 MG PO TABS: 500.0000 mg | ORAL_TABLET | Freq: Every day | ORAL | 3 refills | Status: AC

## 2025-01-31 MED ORDER — AMMONIUM LACTATE 12 % EX LOTN: 1.0000 | TOPICAL_LOTION | CUTANEOUS | 3 refills | Status: AC | PRN

## 2025-01-31 MED ORDER — TAMSULOSIN HCL 0.4 MG PO CAPS: 0.4000 mg | ORAL_CAPSULE | Freq: Every day | ORAL | 3 refills | Status: AC

## 2025-01-31 NOTE — Patient Instructions (Addendum)
 Recheck A1c at a visit in about 3 months.  Take care.  Glad to see you. Restart metformin  in the AMs.  Take tamsulosin  and see if that helps with urination.

## 2025-01-31 NOTE — Progress Notes (Unsigned)
 Diabetes:  No meds.   D/w pt about labs and options.   Feet problems: no  Still on creon .  Not having diarrhea.  No abd pain.  Diet and exercise d/w pt.    Cr d/w pt.  MALB ration reassuring.   A1c 6.9.  D/w pt.    He has nocturia at baseline.    Rx sent for amlactin cream.   PMH and SH reviewed  Meds, vitals, and allergies reviewed.   ROS: Per HPI unless specifically indicated in ROS section   GEN: nad, alert and oriented HEENT: mucous membranes moist NECK: supple w/o LA CV: rrr. PULM: ctab, no inc wob ABD: soft, +bs EXT: no edema SKIN: no acute rash  Diabetic foot exam: Normal inspection No skin breakdown No calluses  Normal DP pulses Normal sensation to light touch and monofilament Nails normal

## 2025-04-30 ENCOUNTER — Ambulatory Visit: Admitting: Family Medicine

## 2026-01-14 ENCOUNTER — Ambulatory Visit
